# Patient Record
Sex: Female | Born: 1950
Health system: Southern US, Community
[De-identification: ages and names within clinical notes are randomized; demographics above are authoritative.]

## PROBLEM LIST (undated history)

## (undated) DIAGNOSIS — F32A Depression, unspecified: Secondary | ICD-10-CM

## (undated) DIAGNOSIS — I429 Cardiomyopathy, unspecified: Secondary | ICD-10-CM

## (undated) DIAGNOSIS — D72819 Decreased white blood cell count, unspecified: Secondary | ICD-10-CM

## (undated) DIAGNOSIS — G500 Trigeminal neuralgia: Secondary | ICD-10-CM

## (undated) DIAGNOSIS — Z9884 Bariatric surgery status: Secondary | ICD-10-CM

## (undated) DIAGNOSIS — J45909 Unspecified asthma, uncomplicated: Secondary | ICD-10-CM

## (undated) DIAGNOSIS — J849 Interstitial pulmonary disease, unspecified: Secondary | ICD-10-CM

## (undated) DIAGNOSIS — E785 Hyperlipidemia, unspecified: Secondary | ICD-10-CM

## (undated) DIAGNOSIS — R310 Gross hematuria: Secondary | ICD-10-CM

## (undated) DIAGNOSIS — M4800 Spinal stenosis, site unspecified: Secondary | ICD-10-CM

## (undated) DIAGNOSIS — I10 Essential (primary) hypertension: Secondary | ICD-10-CM

## (undated) DIAGNOSIS — I728 Aneurysm of other specified arteries: Secondary | ICD-10-CM

## (undated) DIAGNOSIS — M8580 Other specified disorders of bone density and structure, unspecified site: Secondary | ICD-10-CM

## (undated) DIAGNOSIS — D509 Iron deficiency anemia, unspecified: Secondary | ICD-10-CM

## (undated) DIAGNOSIS — Z9989 Dependence on other enabling machines and devices: Secondary | ICD-10-CM

## (undated) DIAGNOSIS — J3089 Other allergic rhinitis: Secondary | ICD-10-CM

## (undated) DIAGNOSIS — T148XXA Other injury of unspecified body region, initial encounter: Secondary | ICD-10-CM

## (undated) DIAGNOSIS — R109 Unspecified abdominal pain: Secondary | ICD-10-CM

## (undated) DIAGNOSIS — I700 Atherosclerosis of aorta: Secondary | ICD-10-CM

## (undated) DIAGNOSIS — R0683 Snoring: Secondary | ICD-10-CM

## (undated) DIAGNOSIS — E669 Obesity, unspecified: Secondary | ICD-10-CM

## (undated) DIAGNOSIS — R609 Edema, unspecified: Secondary | ICD-10-CM

## (undated) DIAGNOSIS — N183 Chronic kidney disease, stage 3 unspecified: Secondary | ICD-10-CM

## (undated) DIAGNOSIS — J189 Pneumonia, unspecified organism: Secondary | ICD-10-CM

## (undated) DIAGNOSIS — M5412 Radiculopathy, cervical region: Secondary | ICD-10-CM

## (undated) DIAGNOSIS — E539 Vitamin B deficiency, unspecified: Secondary | ICD-10-CM

## (undated) DIAGNOSIS — D869 Sarcoidosis, unspecified: Secondary | ICD-10-CM

## (undated) DIAGNOSIS — N281 Cyst of kidney, acquired: Secondary | ICD-10-CM

## (undated) DIAGNOSIS — F329 Major depressive disorder, single episode, unspecified: Secondary | ICD-10-CM

## (undated) DIAGNOSIS — G2581 Restless legs syndrome: Secondary | ICD-10-CM

## (undated) DIAGNOSIS — M1990 Unspecified osteoarthritis, unspecified site: Secondary | ICD-10-CM

## (undated) DIAGNOSIS — G4733 Obstructive sleep apnea (adult) (pediatric): Secondary | ICD-10-CM

## (undated) DIAGNOSIS — D573 Sickle-cell trait: Secondary | ICD-10-CM

## (undated) DIAGNOSIS — R10A0 Flank pain, unspecified side: Secondary | ICD-10-CM

## (undated) HISTORY — DX: Other injury of unspecified body region, initial encounter: T14.8XXA

## (undated) HISTORY — DX: Vitamin B deficiency, unspecified: E53.9

## (undated) HISTORY — DX: Other specified disorders of bone density and structure, unspecified site: M85.80

## (undated) HISTORY — DX: Depression, unspecified: F32.A

## (undated) HISTORY — DX: Major depressive disorder, single episode, unspecified: F32.9

## (undated) HISTORY — DX: Sickle-cell trait: D57.3

## (undated) HISTORY — DX: Restless legs syndrome: G25.81

## (undated) HISTORY — PX: GASTRIC BYPASS: SHX52

## (undated) HISTORY — DX: Trigeminal neuralgia: G50.0

## (undated) HISTORY — DX: Flank pain, unspecified side: R10.A0

## (undated) HISTORY — DX: Iron deficiency anemia, unspecified: D50.9

## (undated) HISTORY — DX: Snoring: R06.83

## (undated) HISTORY — DX: Decreased white blood cell count, unspecified: D72.819

## (undated) HISTORY — DX: Other allergic rhinitis: J30.89

## (undated) HISTORY — DX: Essential (primary) hypertension: I10

## (undated) HISTORY — DX: Obesity, unspecified: E66.9

## (undated) HISTORY — DX: Edema, unspecified: R60.9

## (undated) HISTORY — DX: Radiculopathy, cervical region: M54.12

## (undated) HISTORY — DX: Cyst of kidney, acquired: N28.1

## (undated) HISTORY — DX: Gross hematuria: R31.0

## (undated) HISTORY — DX: Unspecified abdominal pain: R10.9

## (undated) HISTORY — DX: Unspecified osteoarthritis, unspecified site: M19.90

## (undated) HISTORY — DX: Dependence on other enabling machines and devices: Z99.89

## (undated) HISTORY — PX: SHOULDER ARTHROSCOPY: SHX128

## (undated) HISTORY — DX: Obstructive sleep apnea (adult) (pediatric): G47.33

## (undated) HISTORY — DX: Spinal stenosis, site unspecified: M48.00

## (undated) HISTORY — PX: TUBAL LIGATION: SHX77

## (undated) HISTORY — PX: BARIATRIC SURGERY: SHX1103

---

## 2004-09-25 ENCOUNTER — Other Ambulatory Visit: Admission: RE | Admit: 2004-09-25 | Discharge: 2004-09-25 | Payer: Self-pay | Admitting: Obstetrics and Gynecology

## 2004-09-25 ENCOUNTER — Observation Stay (HOSPITAL_COMMUNITY): Admission: AD | Admit: 2004-09-25 | Discharge: 2004-09-25 | Payer: Self-pay | Admitting: Obstetrics and Gynecology

## 2004-10-11 ENCOUNTER — Ambulatory Visit (HOSPITAL_COMMUNITY): Admission: RE | Admit: 2004-10-11 | Discharge: 2004-10-11 | Payer: Self-pay | Admitting: Obstetrics and Gynecology

## 2005-02-28 ENCOUNTER — Ambulatory Visit: Payer: Self-pay | Admitting: Oncology

## 2005-05-07 ENCOUNTER — Ambulatory Visit: Payer: Self-pay | Admitting: Oncology

## 2005-05-08 ENCOUNTER — Ambulatory Visit (HOSPITAL_COMMUNITY): Admission: RE | Admit: 2005-05-08 | Discharge: 2005-05-08 | Payer: Self-pay | Admitting: Oncology

## 2005-10-24 ENCOUNTER — Ambulatory Visit: Payer: Self-pay | Admitting: Oncology

## 2006-02-03 ENCOUNTER — Ambulatory Visit: Payer: Self-pay | Admitting: Oncology

## 2006-05-05 ENCOUNTER — Ambulatory Visit: Payer: Self-pay | Admitting: Oncology

## 2006-08-04 ENCOUNTER — Ambulatory Visit: Payer: Self-pay | Admitting: Oncology

## 2006-11-03 ENCOUNTER — Ambulatory Visit: Payer: Self-pay | Admitting: Oncology

## 2007-04-01 ENCOUNTER — Emergency Department (HOSPITAL_COMMUNITY): Admission: EM | Admit: 2007-04-01 | Discharge: 2007-04-01 | Payer: Self-pay | Admitting: Family Medicine

## 2007-12-11 ENCOUNTER — Emergency Department (HOSPITAL_COMMUNITY): Admission: EM | Admit: 2007-12-11 | Discharge: 2007-12-11 | Payer: Self-pay | Admitting: Emergency Medicine

## 2008-02-01 ENCOUNTER — Emergency Department (HOSPITAL_COMMUNITY): Admission: EM | Admit: 2008-02-01 | Discharge: 2008-02-01 | Payer: Self-pay | Admitting: Emergency Medicine

## 2008-07-20 ENCOUNTER — Encounter: Admission: RE | Admit: 2008-07-20 | Discharge: 2008-07-20 | Payer: Self-pay | Admitting: Family Medicine

## 2008-07-20 ENCOUNTER — Other Ambulatory Visit: Admission: RE | Admit: 2008-07-20 | Discharge: 2008-07-20 | Payer: Self-pay | Admitting: Family Medicine

## 2009-05-14 ENCOUNTER — Emergency Department (HOSPITAL_COMMUNITY): Admission: EM | Admit: 2009-05-14 | Discharge: 2009-05-14 | Payer: Self-pay | Admitting: Emergency Medicine

## 2009-07-27 ENCOUNTER — Emergency Department (HOSPITAL_COMMUNITY): Admission: EM | Admit: 2009-07-27 | Discharge: 2009-07-27 | Payer: Self-pay | Admitting: Emergency Medicine

## 2009-09-16 ENCOUNTER — Emergency Department (HOSPITAL_COMMUNITY): Admission: EM | Admit: 2009-09-16 | Discharge: 2009-09-16 | Payer: Self-pay | Admitting: Family Medicine

## 2010-02-14 ENCOUNTER — Emergency Department (HOSPITAL_COMMUNITY): Admission: EM | Admit: 2010-02-14 | Discharge: 2010-02-14 | Payer: Self-pay | Admitting: Emergency Medicine

## 2010-06-04 ENCOUNTER — Emergency Department (HOSPITAL_COMMUNITY): Admission: EM | Admit: 2010-06-04 | Discharge: 2010-06-04 | Payer: Self-pay | Admitting: Emergency Medicine

## 2010-09-14 ENCOUNTER — Encounter
Admission: RE | Admit: 2010-09-14 | Discharge: 2010-09-14 | Payer: Self-pay | Source: Home / Self Care | Attending: Family Medicine | Admitting: Family Medicine

## 2010-10-12 ENCOUNTER — Other Ambulatory Visit: Payer: Self-pay | Admitting: Family Medicine

## 2010-10-12 DIAGNOSIS — I10 Essential (primary) hypertension: Secondary | ICD-10-CM

## 2010-10-17 ENCOUNTER — Ambulatory Visit
Admission: RE | Admit: 2010-10-17 | Discharge: 2010-10-17 | Disposition: A | Discharge status: Home / Self Care | Payer: Federal, State, Local not specified - PPO | Source: Ambulatory Visit | Attending: Family Medicine | Admitting: Family Medicine

## 2010-10-17 DIAGNOSIS — I10 Essential (primary) hypertension: Secondary | ICD-10-CM

## 2010-10-17 MED ORDER — GADOBENATE DIMEGLUMINE 529 MG/ML IV SOLN
20.0000 mL | Freq: Once | INTRAVENOUS | Status: AC | PRN
  Administered 2010-10-17: 20 mL via INTRAVENOUS

## 2010-10-17 MED ORDER — GADOBENATE DIMEGLUMINE 529 MG/ML IV SOLN: 20.0000 mL | Freq: Once | INTRAVENOUS | Status: AC | PRN

## 2010-10-31 LAB — POCT URINALYSIS DIPSTICK
Bilirubin Urine: NEGATIVE
Glucose, UA: NEGATIVE mg/dL
Ketones, ur: NEGATIVE mg/dL
Nitrite: NEGATIVE
Protein, ur: NEGATIVE mg/dL
Specific Gravity, Urine: 1.015 (ref 1.005–1.030)
Urobilinogen, UA: 0.2 mg/dL (ref 0.0–1.0)
pH: 6 (ref 5.0–8.0)

## 2010-11-05 LAB — URINALYSIS, ROUTINE W REFLEX MICROSCOPIC
Bilirubin Urine: NEGATIVE
Glucose, UA: NEGATIVE mg/dL
Hgb urine dipstick: NEGATIVE
Nitrite: NEGATIVE
Protein, ur: NEGATIVE mg/dL
Specific Gravity, Urine: 1.011 (ref 1.005–1.030)
Urobilinogen, UA: 1 mg/dL (ref 0.0–1.0)
pH: 6.5 (ref 5.0–8.0)

## 2010-11-05 LAB — CBC
HCT: 32.3 % — ABNORMAL LOW (ref 36.0–46.0)
Hemoglobin: 10.8 g/dL — ABNORMAL LOW (ref 12.0–15.0)
MCH: 28.6 pg (ref 26.0–34.0)
MCHC: 33.4 g/dL (ref 30.0–36.0)
MCV: 85.5 fL (ref 78.0–100.0)
Platelets: 214 10*3/uL (ref 150–400)
RBC: 3.78 MIL/uL — ABNORMAL LOW (ref 3.87–5.11)
RDW: 12.4 % (ref 11.5–15.5)
WBC: 4.9 10*3/uL (ref 4.0–10.5)

## 2010-11-05 LAB — COMPREHENSIVE METABOLIC PANEL
ALT: 11 U/L (ref 0–35)
AST: 25 U/L (ref 0–37)
Albumin: 4 g/dL (ref 3.5–5.2)
Alkaline Phosphatase: 64 U/L (ref 39–117)
BUN: 19 mg/dL (ref 6–23)
CO2: 23 mEq/L (ref 19–32)
Calcium: 9.4 mg/dL (ref 8.4–10.5)
Chloride: 108 mEq/L (ref 96–112)
Creatinine, Ser: 1.85 mg/dL — ABNORMAL HIGH (ref 0.4–1.2)
GFR calc Af Amer: 34 mL/min — ABNORMAL LOW (ref >60)
GFR calc non Af Amer: 28 mL/min — ABNORMAL LOW (ref >60)
Glucose, Bld: 94 mg/dL (ref 70–99)
Potassium: 3.8 mEq/L (ref 3.5–5.1)
Sodium: 141 mEq/L (ref 135–145)
Total Bilirubin: 0.6 mg/dL (ref 0.3–1.2)
Total Protein: 7.5 g/dL (ref 6.0–8.3)

## 2010-11-05 LAB — DIFFERENTIAL
Basophils Absolute: 0 10*3/uL (ref 0.0–0.1)
Basophils Relative: 1 % (ref 0–1)
Eosinophils Absolute: 0.1 10*3/uL (ref 0.0–0.7)
Eosinophils Relative: 1 % (ref 0–5)
Lymphocytes Relative: 16 % (ref 12–46)
Lymphs Abs: 0.8 10*3/uL (ref 0.7–4.0)
Monocytes Absolute: 0.5 10*3/uL (ref 0.1–1.0)
Monocytes Relative: 10 % (ref 3–12)
Neutro Abs: 3.6 10*3/uL (ref 1.7–7.7)
Neutrophils Relative %: 73 % (ref 43–77)

## 2010-11-05 LAB — HEMOCCULT GUIAC POC 1CARD (OFFICE): Fecal Occult Bld: NEGATIVE

## 2010-11-05 LAB — URINE CULTURE
Colony Count: NO GROWTH
Culture: NO GROWTH

## 2010-11-05 LAB — D-DIMER, QUANTITATIVE: D-Dimer, Quant: 0.24 ug/mL-FEU (ref 0.00–0.48)

## 2010-11-05 LAB — GLUCOSE, CAPILLARY: Glucose-Capillary: 100 mg/dL — ABNORMAL HIGH (ref 70–99)

## 2010-11-05 LAB — LIPASE, BLOOD: Lipase: 35 U/L (ref 11–59)

## 2010-11-20 LAB — POCT URINALYSIS DIP (DEVICE)
Bilirubin Urine: NEGATIVE
Glucose, UA: NEGATIVE mg/dL
Ketones, ur: NEGATIVE mg/dL
Nitrite: NEGATIVE
Protein, ur: NEGATIVE mg/dL
Specific Gravity, Urine: 1.015 (ref 1.005–1.030)
Urobilinogen, UA: 0.2 mg/dL (ref 0.0–1.0)
pH: 5.5 (ref 5.0–8.0)

## 2011-01-04 NOTE — Discharge Summary (Signed)
NAME:  Melissa [name], Melissa [name]            ACCOUNT NO.:  [id number]   MEDICAL RECORD NO.:  [id number]          PATIENT TYPE:  OBV   LOCATION:  9311                          FACILITY:  [facility]   PHYSICIAN:  Janine Limbo, M.D.DATE OF BIRTH:  1951-01-26   DATE OF ADMISSION:  09/25/2004  DATE OF DISCHARGE:  09/25/2004                                 DISCHARGE SUMMARY   DISCHARGE DIAGNOSES:  1.  Menometrorrhagia.  2.  Severe anemia.  3.  Dizziness.  4.  Fatigue.   PROCEDURE:  Blood transfusion of 2 units.   HISTORY OF PRESENT ILLNESS:  Melissa [name] is a 60 year old female with a  known history of menometrorrhagia.  The patient complains of increasing  dizziness and fatigue.  The patient presented to the office where she was  found to be significantly orthostatic.  Her hemoglobin was noted to be 6.7.  her white blood cell count was 4700.  Her platelet count was 282,000.   ADMISSION PHYSICAL EXAMINATION:  VITAL SIGNS:  Stable.  HEENT:  Within normal limits.  CHEST:  Clear.  HEART:  Regular rate and rhythm.  ABDOMEN:  Soft.  PELVIC:  Exam was per Dr. Redmond Baseman exam in the office.   HOSPITAL COURSE:  The patient was observed under the observation status.  She was transfused 2 units of packed red blood cells which she tolerated  well.  She was discharged home after her transfusion was completed.  Her  post-transfusion hemoglobin was 7.5.  Her chemistries were within normal  limits, except for a glucose of 134 and an albumin of 3.4.  Her TSH was  2.288.  Her total iron was 10 (normal 42 to 135).  Her B12 was 340 which is  considered normal.  Her folate was 11.9 which is considered normal.  Her  prolactin was 22.6.  Her blood type is noted to be AB positive.  Her  antibody screen is negative.   FOLLOW UP:  The patient will return to the office for a followup examination  with Dr. Normand Sloop in two weeks.  She will call for questions or concerns.      AVS/MEDQ  D:  10/11/2004  T:  10/11/2004   Job:  [id number]

## 2011-03-13 ENCOUNTER — Other Ambulatory Visit: Payer: Self-pay | Admitting: Family Medicine

## 2011-03-13 ENCOUNTER — Ambulatory Visit
Admission: RE | Admit: 2011-03-13 | Discharge: 2011-03-13 | Disposition: A | Discharge status: Home / Self Care | Payer: Federal, State, Local not specified - PPO | Source: Ambulatory Visit | Attending: Family Medicine | Admitting: Family Medicine

## 2011-03-13 DIAGNOSIS — J189 Pneumonia, unspecified organism: Secondary | ICD-10-CM

## 2011-04-15 ENCOUNTER — Other Ambulatory Visit: Payer: Self-pay | Admitting: Family Medicine

## 2011-04-15 ENCOUNTER — Ambulatory Visit
Admission: RE | Admit: 2011-04-15 | Discharge: 2011-04-15 | Disposition: A | Discharge status: Home / Self Care | Payer: Federal, State, Local not specified - PPO | Source: Ambulatory Visit | Attending: Family Medicine | Admitting: Family Medicine

## 2011-04-15 DIAGNOSIS — R059 Cough, unspecified: Secondary | ICD-10-CM

## 2011-04-15 DIAGNOSIS — R05 Cough: Secondary | ICD-10-CM

## 2011-06-03 LAB — POCT URINALYSIS DIP (DEVICE)
Bilirubin Urine: NEGATIVE
Glucose, UA: NEGATIVE
Ketones, ur: NEGATIVE
Nitrite: NEGATIVE
Operator id: 239701
Protein, ur: NEGATIVE
Specific Gravity, Urine: 1.02
Urobilinogen, UA: 0.2
pH: 5.5

## 2011-11-07 ENCOUNTER — Ambulatory Visit: Payer: Self-pay | Admitting: Family Medicine

## 2012-02-12 ENCOUNTER — Emergency Department: Payer: Self-pay | Admitting: *Deleted

## 2012-02-12 LAB — COMPREHENSIVE METABOLIC PANEL
Albumin: 3.7 g/dL (ref 3.4–5.0)
Alkaline Phosphatase: 88 U/L (ref 50–136)
Anion Gap: 9 (ref 7–16)
BUN: 19 mg/dL — ABNORMAL HIGH (ref 7–18)
Bilirubin,Total: 0.4 mg/dL (ref 0.2–1.0)
Calcium, Total: 9 mg/dL (ref 8.5–10.1)
Chloride: 104 mmol/L (ref 98–107)
Co2: 26 mmol/L (ref 21–32)
Creatinine: 1.57 mg/dL — ABNORMAL HIGH (ref 0.60–1.30)
EGFR (African American): 41 — ABNORMAL LOW
EGFR (Non-African Amer.): 36 — ABNORMAL LOW
Glucose: 106 mg/dL — ABNORMAL HIGH (ref 65–99)
Osmolality: 280 (ref 275–301)
Potassium: 4 mmol/L (ref 3.5–5.1)
SGOT(AST): 30 U/L (ref 15–37)
SGPT (ALT): 14 U/L
Sodium: 139 mmol/L (ref 136–145)
Total Protein: 7.5 g/dL (ref 6.4–8.2)

## 2012-02-12 LAB — CBC
HCT: 34.2 % — ABNORMAL LOW (ref 35.0–47.0)
HGB: 11.4 g/dL — ABNORMAL LOW (ref 12.0–16.0)
MCH: 29.1 pg (ref 26.0–34.0)
MCHC: 33.3 g/dL (ref 32.0–36.0)
MCV: 87 fL (ref 80–100)
Platelet: 197 10*3/uL (ref 150–440)
RBC: 3.91 10*6/uL (ref 3.80–5.20)
RDW: 12.7 % (ref 11.5–14.5)
WBC: 5.3 10*3/uL (ref 3.6–11.0)

## 2012-02-12 LAB — URINALYSIS, COMPLETE
Bilirubin,UR: NEGATIVE
Glucose,UR: NEGATIVE mg/dL (ref 0–75)
Ketone: NEGATIVE
Nitrite: NEGATIVE
Ph: 6 (ref 4.5–8.0)
Protein: NEGATIVE
RBC,UR: 4 /HPF (ref 0–5)
Specific Gravity: 1.006 (ref 1.003–1.030)
Squamous Epithelial: 1
WBC UR: 9 /HPF (ref 0–5)

## 2012-02-12 LAB — CK TOTAL AND CKMB (NOT AT ARMC)
CK, Total: 257 U/L — ABNORMAL HIGH (ref 21–215)
CK-MB: 2.5 ng/mL (ref 0.5–3.6)

## 2012-11-20 ENCOUNTER — Ambulatory Visit: Payer: Self-pay | Admitting: Family Medicine

## 2012-12-11 ENCOUNTER — Ambulatory Visit: Payer: Self-pay | Admitting: Family Medicine

## 2013-03-08 ENCOUNTER — Ambulatory Visit: Payer: Self-pay | Admitting: Family Medicine

## 2013-05-07 ENCOUNTER — Ambulatory Visit: Payer: Self-pay | Admitting: Family Medicine

## 2013-10-12 ENCOUNTER — Ambulatory Visit: Payer: Self-pay | Admitting: Family Medicine

## 2013-10-21 ENCOUNTER — Ambulatory Visit: Payer: Self-pay | Admitting: Family Medicine

## 2014-01-04 DIAGNOSIS — M4802 Spinal stenosis, cervical region: Secondary | ICD-10-CM | POA: Insufficient documentation

## 2014-01-04 DIAGNOSIS — M47812 Spondylosis without myelopathy or radiculopathy, cervical region: Secondary | ICD-10-CM | POA: Insufficient documentation

## 2014-01-04 DIAGNOSIS — M5412 Radiculopathy, cervical region: Secondary | ICD-10-CM | POA: Insufficient documentation

## 2014-02-17 ENCOUNTER — Ambulatory Visit: Payer: Self-pay | Admitting: Family Medicine

## 2014-03-03 ENCOUNTER — Ambulatory Visit: Payer: Self-pay | Admitting: Gastroenterology

## 2014-03-22 ENCOUNTER — Ambulatory Visit: Payer: Self-pay | Admitting: Urology

## 2014-03-29 ENCOUNTER — Ambulatory Visit: Payer: Self-pay | Admitting: Urology

## 2014-04-15 ENCOUNTER — Emergency Department: Payer: Self-pay | Admitting: Emergency Medicine

## 2014-04-15 LAB — URINALYSIS, COMPLETE
Bilirubin,UR: NEGATIVE
Glucose,UR: NEGATIVE mg/dL (ref 0–75)
Ketone: NEGATIVE
Nitrite: NEGATIVE
Ph: 5 (ref 4.5–8.0)
Protein: NEGATIVE
RBC,UR: 2 /HPF (ref 0–5)
Specific Gravity: 1.014 (ref 1.003–1.030)
Squamous Epithelial: 5
WBC UR: 13 /HPF (ref 0–5)

## 2014-04-15 LAB — CBC
HCT: 36.5 % (ref 35.0–47.0)
HGB: 11.6 g/dL — ABNORMAL LOW (ref 12.0–16.0)
MCH: 27.1 pg (ref 26.0–34.0)
MCHC: 31.8 g/dL — ABNORMAL LOW (ref 32.0–36.0)
MCV: 85 fL (ref 80–100)
Platelet: 269 10*3/uL (ref 150–440)
RBC: 4.28 10*6/uL (ref 3.80–5.20)
RDW: 13.1 % (ref 11.5–14.5)
WBC: 6.3 10*3/uL (ref 3.6–11.0)

## 2014-04-15 LAB — COMPREHENSIVE METABOLIC PANEL
Albumin: 3.6 g/dL (ref 3.4–5.0)
Alkaline Phosphatase: 89 U/L
Anion Gap: 7 (ref 7–16)
BUN: 20 mg/dL — ABNORMAL HIGH (ref 7–18)
Bilirubin,Total: 0.3 mg/dL (ref 0.2–1.0)
Calcium, Total: 8.6 mg/dL (ref 8.5–10.1)
Chloride: 107 mmol/L (ref 98–107)
Co2: 25 mmol/L (ref 21–32)
Creatinine: 1.74 mg/dL — ABNORMAL HIGH (ref 0.60–1.30)
EGFR (African American): 36 — ABNORMAL LOW
EGFR (Non-African Amer.): 31 — ABNORMAL LOW
Glucose: 94 mg/dL (ref 65–99)
Osmolality: 280 (ref 275–301)
Potassium: 4.4 mmol/L (ref 3.5–5.1)
SGOT(AST): 24 U/L (ref 15–37)
SGPT (ALT): 12 U/L — ABNORMAL LOW
Sodium: 139 mmol/L (ref 136–145)
Total Protein: 7.6 g/dL (ref 6.4–8.2)

## 2014-04-16 LAB — CK TOTAL AND CKMB (NOT AT ARMC)
CK, Total: 129 U/L
CK-MB: 1.3 ng/mL (ref 0.5–3.6)

## 2014-04-16 LAB — MAGNESIUM: Magnesium: 2 mg/dL

## 2014-10-21 ENCOUNTER — Emergency Department: Payer: Self-pay | Admitting: Emergency Medicine

## 2015-01-18 ENCOUNTER — Telehealth: Payer: Self-pay | Admitting: Family Medicine

## 2015-01-18 NOTE — Telephone Encounter (Signed)
I did not make a referral on her last visit, but she has seen Dr. Arnoldo Morale in the past For her sinus, she can try fluids, rest, neti pot, otc nasal sprays and otc cold medication if not better return for follow up

## 2015-03-16 ENCOUNTER — Other Ambulatory Visit: Payer: Self-pay

## 2015-03-16 DIAGNOSIS — R310 Gross hematuria: Secondary | ICD-10-CM

## 2015-04-14 ENCOUNTER — Other Ambulatory Visit: Payer: Self-pay | Admitting: Family Medicine

## 2015-04-17 ENCOUNTER — Other Ambulatory Visit: Payer: Self-pay | Admitting: *Deleted

## 2015-04-17 ENCOUNTER — Encounter: Payer: Self-pay | Admitting: *Deleted

## 2015-04-25 ENCOUNTER — Ambulatory Visit: Payer: Self-pay | Admitting: Urology

## 2015-05-01 ENCOUNTER — Encounter: Payer: Self-pay | Admitting: Urology

## 2015-05-01 ENCOUNTER — Ambulatory Visit: Payer: Self-pay | Admitting: Urology

## 2015-06-13 ENCOUNTER — Encounter: Payer: Self-pay | Admitting: Family Medicine

## 2015-06-13 ENCOUNTER — Ambulatory Visit (INDEPENDENT_AMBULATORY_CARE_PROVIDER_SITE_OTHER): Payer: Federal, State, Local not specified - PPO | Admitting: Family Medicine

## 2015-06-13 VITALS — BP 136/86 | HR 110 | Temp 97.8°F | Resp 16 | Ht 71.0 in | Wt 242.8 lb

## 2015-06-13 DIAGNOSIS — F325 Major depressive disorder, single episode, in full remission: Secondary | ICD-10-CM | POA: Insufficient documentation

## 2015-06-13 DIAGNOSIS — Z23 Encounter for immunization: Secondary | ICD-10-CM

## 2015-06-13 DIAGNOSIS — Z862 Personal history of diseases of the blood and blood-forming organs and certain disorders involving the immune mechanism: Secondary | ICD-10-CM

## 2015-06-13 DIAGNOSIS — M7070 Other bursitis of hip, unspecified hip: Secondary | ICD-10-CM | POA: Diagnosis not present

## 2015-06-13 DIAGNOSIS — E559 Vitamin D deficiency, unspecified: Secondary | ICD-10-CM | POA: Diagnosis not present

## 2015-06-13 DIAGNOSIS — I10 Essential (primary) hypertension: Secondary | ICD-10-CM

## 2015-06-13 DIAGNOSIS — J302 Other seasonal allergic rhinitis: Secondary | ICD-10-CM

## 2015-06-13 DIAGNOSIS — D72819 Decreased white blood cell count, unspecified: Secondary | ICD-10-CM

## 2015-06-13 DIAGNOSIS — Z9884 Bariatric surgery status: Secondary | ICD-10-CM

## 2015-06-13 DIAGNOSIS — G2581 Restless legs syndrome: Secondary | ICD-10-CM

## 2015-06-13 DIAGNOSIS — E538 Deficiency of other specified B group vitamins: Secondary | ICD-10-CM | POA: Insufficient documentation

## 2015-06-13 DIAGNOSIS — F331 Major depressive disorder, recurrent, moderate: Secondary | ICD-10-CM | POA: Diagnosis not present

## 2015-06-13 DIAGNOSIS — E669 Obesity, unspecified: Secondary | ICD-10-CM

## 2015-06-13 DIAGNOSIS — Z13220 Encounter for screening for lipoid disorders: Secondary | ICD-10-CM

## 2015-06-13 DIAGNOSIS — M170 Bilateral primary osteoarthritis of knee: Secondary | ICD-10-CM | POA: Insufficient documentation

## 2015-06-13 DIAGNOSIS — M1710 Unilateral primary osteoarthritis, unspecified knee: Secondary | ICD-10-CM

## 2015-06-13 DIAGNOSIS — J3089 Other allergic rhinitis: Secondary | ICD-10-CM | POA: Insufficient documentation

## 2015-06-13 DIAGNOSIS — R0789 Other chest pain: Secondary | ICD-10-CM

## 2015-06-13 MED ORDER — LOSARTAN POTASSIUM 25 MG PO TABS: 25.0000 mg | ORAL_TABLET | Freq: Every day | ORAL | Status: DC

## 2015-06-13 MED ORDER — CYANOCOBALAMIN 1000 MCG/ML IJ SOLN
1000.0000 ug | Freq: Once | INTRAMUSCULAR | Status: AC
  Administered 2015-06-13: 1000 ug via INTRAMUSCULAR

## 2015-06-13 MED ORDER — DULOXETINE HCL 30 MG PO CPEP: 30.0000 mg | ORAL_CAPSULE | Freq: Every day | ORAL | Status: DC

## 2015-06-13 NOTE — Progress Notes (Signed)
Name: Melissa [name]   MRN: [id number]    DOB: September 28, 1950   Date:06/13/2015       Progress Note  Subjective  Chief Complaint  Chief Complaint  Patient presents with  . Medication Refill    follow-up  . Hypertension    some chest tightness  . Depression    on and off  . Hip Pain    onset several months worsening  . Pain    back, knees, hip and shoulder ongoing    HPI  HTN: patient is taking bp medication at low dose, and bp has been at goal, she has noticed some chest tightness that happens twice weekly, not related with activity, or meals. Lasts between 10-30 minutes, and resolves by itself. She denies diaphoresis, or associated SOB, pain does not radiate. Pain is 3-4/10  Neck pain/back pain/hip pain/shoulder pain: seen by Dr. Phyllis Ginger recently and had steroid injection on right shoulder, also going to see Dr. Arnoldo Morale next week and will discuss pain on right leg, and low back pain. Concerned about all the steroid injection, but is always in pain, in different sites.   Major Depression: she has been taking Cymbalta 60 mg but still has severe anhedonia, crying spells, no energy, feels sad.  She states concerned about mother that is in a nursing home but also is pain all the time. Only gets out to go to work and to [employer].   B12 deficiency: got B12 injection today, feeling very tired.   Obesity: she had gastric bypass in 2000, she went down to 172 lbs after surgery, but has gradually gained it back. She states food is her comfort.    Patient Active Problem List   Diagnosis Date Noted  . Primary osteoarthritis of knee 06/13/2015  . Hip bursitis 06/13/2015  . RLS (restless legs syndrome) 06/13/2015  . Depression, major, recurrent, moderate ([place]) 06/13/2015  . Leukopenia 06/13/2015  . B12 deficiency 06/13/2015  . History of Roux-en-Y gastric bypass 06/13/2015  . History of iron deficiency anemia 06/13/2015  . Vitamin D deficiency 06/13/2015  . Hypertension, benign 06/13/2015   . Allergic rhinitis, seasonal 06/13/2015  . Cervical radiculitis 01/04/2014  . Cervical spinal stenosis 01/04/2014  . Cervical osteoarthritis 01/04/2014    Past Surgical History  Procedure Laterality Date  . Bariatric surgery    . Gastric bypass    . Cesarean section      3 or more  . Tubal ligation      Family History  Problem Relation Age of Onset  . Hypercholesterolemia Mother   . Heart disease Mother   . Hypertension Mother   . Lung cancer Brother   . Alcohol abuse Father   . Alcohol abuse Brother   . Diabetes Mellitus II Sister     Social History   Social History  . Marital Status: Single    Spouse Name: N/A  . Number of Children: N/A  . Years of Education: N/A   Occupational History  . Not on file.   Social History Main Topics  . Smoking status: Never Smoker   . Smokeless tobacco: Never Used  . Alcohol Use: No  . Drug Use: No  . Sexual Activity: Not Currently   Other Topics Concern  . Not on file   Social History Narrative     Current outpatient prescriptions:  .  DULoxetine (CYMBALTA) 30 MG capsule, Take 1 capsule (30 mg total) by mouth daily., Disp: 90 capsule, Rfl: 0 .  ferrous sulfate 325 (65  FE) MG EC tablet, Take 325 mg by mouth 3 (three) times daily with meals., Disp: , Rfl:  .  fluticasone (FLONASE) 50 MCG/ACT nasal spray, Place 2 sprays into both nostrils daily., Disp: , Rfl: 2 .  losartan (COZAAR) 25 MG tablet, Take 1 tablet (25 mg total) by mouth daily., Disp: 90 tablet, Rfl: 1 .  magnesium oxide (MAG-OX) 400 MG tablet, Take 400 mg by mouth daily., Disp: , Rfl:   Allergies  Allergen Reactions  . Contrast Media [Iodinated Diagnostic Agents]   . Shellfish Allergy     Edema     ROS  Constitutional: Negative for fever, mild weight change.  Respiratory: Negative for cough and shortness of breath.   Cardiovascular: Negative for chest pain or palpitations.  Gastrointestinal: Negative for abdominal pain, no bowel changes.   Musculoskeletal: Positive  for gait problem no  joint swelling.  Skin: Negative for rash.  Neurological: Negative for dizziness or headache.  No other specific complaints in a complete review of systems (except as listed in HPI above).   Objective  Filed Vitals:   06/13/15 1603  BP: 136/86  Pulse: 110  Temp: 97.8 F (36.6 C)  TempSrc: Oral  Resp: 16  Height: 5\' 11"  (1.803 m)  Weight: 242 lb 12.8 oz (110.133 kg)  SpO2: 97%    Body mass index is 33.88 kg/(m^2).  Physical Exam  Constitutional: Patient appears well-developed and well-nourished. Obese  No distress.  HEENT: head atraumatic, normocephalic, pupils equal and reactive to light,, neck supple, throat within normal limits Cardiovascular: Normal rate, regular rhythm and normal heart sounds.  No murmur heard. No BLE edema. Pulmonary/Chest: Effort normal and breath sounds normal. No respiratory distress. Abdominal: Soft.  There is no tenderness. Psychiatric: Patient has a normal mood and affect. behavior is normal. Judgment and thought content normal. Muscular Skeletal: no pain during palpation of lumbar spine, negative straight raise, grinding with extension of both knees, pain during palpation of right trochanteric bursa and anterior right knee   PHQ2/9: Depression screen PHQ 2/9 06/13/2015  Decreased Interest 0  Down, Depressed, Hopeless 0  PHQ - 2 Score 0     Fall Risk: Fall Risk  06/13/2015  Falls in the past year? Yes  Number falls in past yr: 1  Injury with Fall? No     Functional Status Survey: Is the patient deaf or have difficulty hearing?: No Does the patient have difficulty seeing, even when wearing glasses/contacts?: Yes (glasses) Does the patient have difficulty concentrating, remembering, or making decisions?: No Does the patient have difficulty walking or climbing stairs?: No Does the patient have difficulty dressing or bathing?: No Does the patient have difficulty doing errands alone such as  visiting a doctor's office or shopping?: No    Assessment & Plan  1. Depression, major, recurrent, moderate ([place])  We will increase dose and if no improvement consider Trintelix, also discussed therapist and she will go to the same one her son goes to - DULoxetine (CYMBALTA) 30 MG capsule; Take 1 capsule (30 mg total) by mouth daily.  Dispense: 90 capsule; Refill: 0  2. Primary osteoarthritis of knee, unspecified laterality  Continue follow up with Ortho  3. Needs flu shot  - Flu Vaccine QUAD 36+ mos PF IM (Fluarix & Fluzone Quad PF)  4. Vitamin B12 deficiency  - cyanocobalamin ((VITAMIN B-12)) injection 1,000 mcg; Inject 1 mL (1,000 mcg total) into the muscle once. - Vitamin B12  5. Hip bursitis, unspecified laterality  - CBC with  Differential/Platelet  6. RLS (restless legs syndrome)  Doing well   7. Leukopenia  - CBC with Differential/Platelet  8. History of Roux-en-Y gastric bypass  - Hemoglobin A1c  9. History of iron deficiency anemia  Recheck labs  10. Vitamin D deficiency  - Vit D  25 hydroxy (rtn osteoporosis monitoring)  11. Hypertension, benign  - losartan (COZAAR) 25 MG tablet; Take 1 tablet (25 mg total) by mouth daily.  Dispense: 90 tablet; Refill: 1 - Hemoglobin A1c - Comprehensive metabolic panel  12. Allergic rhinitis, seasonal  Doing well on medication   13. Chest tightness  - EKG 12-Lead - Ambulatory referral to Cardiology  14. Lipid screening  - Lipid panel  15. Obesity (BMI 30.0-34.9)  Discussed with the patient the risk posed by an increased BMI. Discussed importance of portion control, calorie counting and at least 150 minutes of physical activity weekly. Avoid sweet beverages and drink more water. Eat at least 6 servings of fruit and vegetables daily

## 2015-06-22 ENCOUNTER — Other Ambulatory Visit: Payer: Self-pay | Admitting: Neurosurgery

## 2015-06-22 DIAGNOSIS — M4316 Spondylolisthesis, lumbar region: Secondary | ICD-10-CM

## 2015-06-29 LAB — LIPID PANEL
Chol/HDL Ratio: 2.8 ratio units (ref 0.0–4.4)
Cholesterol, Total: 256 mg/dL — ABNORMAL HIGH (ref 100–199)
HDL: 92 mg/dL (ref >39)
LDL Calculated: 142 mg/dL — ABNORMAL HIGH (ref 0–99)
Triglycerides: 108 mg/dL (ref 0–149)
VLDL Cholesterol Cal: 22 mg/dL (ref 5–40)

## 2015-06-29 LAB — CBC WITH DIFFERENTIAL/PLATELET
Basophils Absolute: 0 10*3/uL (ref 0.0–0.2)
Basos: 0 %
EOS (ABSOLUTE): 0.1 10*3/uL (ref 0.0–0.4)
Eos: 2 %
Hematocrit: 35.2 % (ref 34.0–46.6)
Hemoglobin: 10.9 g/dL — ABNORMAL LOW (ref 11.1–15.9)
Immature Grans (Abs): 0 10*3/uL (ref 0.0–0.1)
Immature Granulocytes: 0 %
Lymphocytes Absolute: 1.1 10*3/uL (ref 0.7–3.1)
Lymphs: 19 %
MCH: 24.6 pg — ABNORMAL LOW (ref 26.6–33.0)
MCHC: 31 g/dL — ABNORMAL LOW (ref 31.5–35.7)
MCV: 80 fL (ref 79–97)
Monocytes Absolute: 0.1 10*3/uL (ref 0.1–0.9)
Monocytes: 2 %
Neutrophils Absolute: 4.6 10*3/uL (ref 1.4–7.0)
Neutrophils: 77 %
Platelets: 317 10*3/uL (ref 150–379)
RBC: 4.43 x10E6/uL (ref 3.77–5.28)
RDW: 15 % (ref 12.3–15.4)
WBC: 6 10*3/uL (ref 3.4–10.8)

## 2015-06-29 LAB — COMPREHENSIVE METABOLIC PANEL
ALT: 9 IU/L (ref 0–32)
AST: 16 IU/L (ref 0–40)
Albumin/Globulin Ratio: 1.4 (ref 1.1–2.5)
Albumin: 4.1 g/dL (ref 3.6–4.8)
Alkaline Phosphatase: 93 IU/L (ref 39–117)
BUN/Creatinine Ratio: 12 (ref 11–26)
BUN: 14 mg/dL (ref 8–27)
Bilirubin Total: 0.3 mg/dL (ref 0.0–1.2)
CO2: 23 mmol/L (ref 18–29)
Calcium: 9.4 mg/dL (ref 8.7–10.3)
Chloride: 102 mmol/L (ref 97–106)
Creatinine, Ser: 1.19 mg/dL — ABNORMAL HIGH (ref 0.57–1.00)
GFR calc Af Amer: 57 mL/min/{1.73_m2} — ABNORMAL LOW (ref >59)
GFR calc non Af Amer: 49 mL/min/{1.73_m2} — ABNORMAL LOW (ref >59)
Globulin, Total: 3 g/dL (ref 1.5–4.5)
Glucose: 113 mg/dL — ABNORMAL HIGH (ref 65–99)
Potassium: 4.3 mmol/L (ref 3.5–5.2)
Sodium: 141 mmol/L (ref 136–144)
Total Protein: 7.1 g/dL (ref 6.0–8.5)

## 2015-06-29 LAB — HEMOGLOBIN A1C
Est. average glucose Bld gHb Est-mCnc: 128 mg/dL
Hgb A1c MFr Bld: 6.1 % — ABNORMAL HIGH (ref 4.8–5.6)

## 2015-06-29 LAB — VITAMIN B12: Vitamin B-12: 787 pg/mL (ref 211–946)

## 2015-06-29 LAB — VITAMIN D 25 HYDROXY (VIT D DEFICIENCY, FRACTURES): Vit D, 25-Hydroxy: 14.1 ng/mL — ABNORMAL LOW (ref 30.0–100.0)

## 2015-07-02 ENCOUNTER — Other Ambulatory Visit: Payer: Self-pay | Admitting: Family Medicine

## 2015-07-02 ENCOUNTER — Encounter: Payer: Self-pay | Admitting: Family Medicine

## 2015-07-02 DIAGNOSIS — N183 Chronic kidney disease, stage 3 (moderate): Secondary | ICD-10-CM

## 2015-07-02 DIAGNOSIS — D649 Anemia, unspecified: Secondary | ICD-10-CM

## 2015-07-06 ENCOUNTER — Ambulatory Visit: Admission: RE | Admit: 2015-07-06 | Payer: Federal, State, Local not specified - PPO | Source: Ambulatory Visit

## 2015-08-11 ENCOUNTER — Encounter: Payer: Self-pay | Admitting: Family Medicine

## 2015-08-11 ENCOUNTER — Ambulatory Visit (INDEPENDENT_AMBULATORY_CARE_PROVIDER_SITE_OTHER): Payer: Federal, State, Local not specified - PPO | Admitting: Family Medicine

## 2015-08-11 VITALS — BP 132/86 | HR 108 | Temp 98.3°F | Resp 18 | Ht 71.0 in | Wt 240.5 lb

## 2015-08-11 DIAGNOSIS — K219 Gastro-esophageal reflux disease without esophagitis: Secondary | ICD-10-CM | POA: Diagnosis not present

## 2015-08-11 DIAGNOSIS — E559 Vitamin D deficiency, unspecified: Secondary | ICD-10-CM | POA: Diagnosis not present

## 2015-08-11 DIAGNOSIS — F331 Major depressive disorder, recurrent, moderate: Secondary | ICD-10-CM

## 2015-08-11 DIAGNOSIS — M1711 Unilateral primary osteoarthritis, right knee: Secondary | ICD-10-CM

## 2015-08-11 DIAGNOSIS — E538 Deficiency of other specified B group vitamins: Secondary | ICD-10-CM | POA: Diagnosis not present

## 2015-08-11 DIAGNOSIS — Z862 Personal history of diseases of the blood and blood-forming organs and certain disorders involving the immune mechanism: Secondary | ICD-10-CM

## 2015-08-11 DIAGNOSIS — N183 Chronic kidney disease, stage 3 unspecified: Secondary | ICD-10-CM

## 2015-08-11 DIAGNOSIS — I10 Essential (primary) hypertension: Secondary | ICD-10-CM

## 2015-08-11 MED ORDER — CYANOCOBALAMIN 1000 MCG/ML IJ SOLN
1000.0000 ug | Freq: Once | INTRAMUSCULAR | Status: AC
  Administered 2015-08-11: 1000 ug via INTRAMUSCULAR

## 2015-08-11 MED ORDER — RANITIDINE HCL 150 MG PO TABS: 150.0000 mg | ORAL_TABLET | Freq: Two times a day (BID) | ORAL | Status: DC

## 2015-08-11 MED ORDER — DULOXETINE HCL 60 MG PO CPEP: 60.0000 mg | ORAL_CAPSULE | Freq: Every day | ORAL | Status: DC

## 2015-08-11 NOTE — Progress Notes (Signed)
Name: ROREY KNILL   MRN: [id number]    DOB: [birth year]   Date:08/11/2015       Progress Note  Subjective  Chief Complaint  Chief Complaint  Patient presents with  . Medication Management  . Anemia    Taking 1 Iron supplement daily  . Vitamin D Deficiency    Needs High dose called in, never receieved prescription.    . Hyperlipidemia    When having labs drawn was told needed to talk about starting a statin therapy.  . Hip Pain    Right hip pain, intermittent, onset-6 weeks   . Gastroesophageal Reflux    Onset-2 months, feels like her chest is burning can happen went she has not even ate anything and has a appointment with cardiology but feels like it is heartburn.  . Depression    Would like to discuss therapy on medication    HPI   HTN: patient is taking bp medication at low dose, and bp has been at goal, she has noticed some chest tightness that happens twice weekly with episodes of regurgitation ( she has an appointment with cardiologist in January )  Lasts between 10-30 minutes, and resolves by itselfes not radiate.  Major Depression: she has been taking Cymbalta 60 mg, she is feeling better since mother died. She grieved before she diet. More motivation now, no crying spells,  feeling much better, she is cleaning her house again  B12 deficiency: she is due for B12 injection   Obesity: she had gastric bypass in 2000, she went down to 172 lbs after surgery, but has gradually gained it back. She lost 3 lbs since last visit.   GERD: still has episodes of regurgitation, she is not currently taking anything for it, but willing to try. Took some baking soda recently with improvemet of heartburn.   CKI: seen by Dr. Juleen China in the past, but lost to follow up, GFR is in the 30's.  She used to take a lot of nsaid's but not currently.   OA knee and hip bursitis: having a lot pain on right knee and hip, but wants to follow up with Ortho to have injections.    Patient Active  Problem List   Diagnosis Date Noted  . Chronic kidney disease (CKD), stage III (moderate) 07/02/2015  . Primary osteoarthritis of knee 06/13/2015  . Hip bursitis 06/13/2015  . RLS (restless legs syndrome) 06/13/2015  . Depression, major, recurrent, moderate ([place]) 06/13/2015  . B12 deficiency 06/13/2015  . History of Roux-en-Y gastric bypass 06/13/2015  . History of iron deficiency anemia 06/13/2015  . Vitamin D deficiency 06/13/2015  . Hypertension, benign 06/13/2015  . Allergic rhinitis, seasonal 06/13/2015  . Obesity (BMI 30.0-34.9) 06/13/2015  . Cervical radiculitis 01/04/2014  . Cervical spinal stenosis 01/04/2014  . Cervical osteoarthritis 01/04/2014    Past Surgical History  Procedure Laterality Date  . Bariatric surgery    . Gastric bypass    . Cesarean section      3 or more  . Tubal ligation      Family History  Problem Relation Age of Onset  . Hypercholesterolemia Mother   . Heart disease Mother   . Hypertension Mother   . Lung cancer Brother   . Alcohol abuse Father   . Alcohol abuse Brother   . Diabetes Mellitus II Sister     Social History   Social History  . Marital Status: Divorced    Spouse Name: N/A  . Number of Children: N/A  .  Years of Education: N/A   Occupational History  . Not on file.   Social History Main Topics  . Smoking status: Never Smoker   . Smokeless tobacco: Never Used  . Alcohol Use: No  . Drug Use: No  . Sexual Activity: Not Currently   Other Topics Concern  . Not on file   Social History Narrative     Current outpatient prescriptions:  .  DULoxetine (CYMBALTA) 60 MG capsule, Take 1 capsule (60 mg total) by mouth daily., Disp: 90 capsule, Rfl: 0 .  ferrous sulfate 325 (65 FE) MG EC tablet, Take 325 mg by mouth 3 (three) times daily with meals., Disp: , Rfl:  .  fluticasone (FLONASE) 50 MCG/ACT nasal spray, Place 2 sprays into both nostrils daily., Disp: , Rfl: 2 .  losartan (COZAAR) 25 MG tablet, Take 1 tablet (25  mg total) by mouth daily., Disp: 90 tablet, Rfl: 1 .  magnesium oxide (MAG-OX) 400 MG tablet, Take 400 mg by mouth daily., Disp: , Rfl:   Allergies  Allergen Reactions  . Contrast Media [Iodinated Diagnostic Agents]   . Shellfish Allergy     Edema     ROS  Ten systems reviewed and is negative except as mentioned in HPI   Objective  Filed Vitals:   08/11/15 1600  BP: 132/86  Pulse: 108  Temp: 98.3 F (36.8 C)  TempSrc: Oral  Resp: 18  Height: 5\' 11"  (1.803 m)  Weight: 240 lb 8 oz (109.09 kg)  SpO2: 96%    Body mass index is 33.56 kg/(m^2).  Physical Exam  Constitutional: Patient appears well-developed and well-nourished. Obese  No distress.  HEENT: head atraumatic, normocephalic, pupils equal and reactive to light,  neck supple, throat within normal limits Cardiovascular: Normal rate, regular rhythm and normal heart sounds.  No murmur heard. No BLE edema. Pulmonary/Chest: Effort normal and breath sounds normal. No respiratory distress. Abdominal: Soft.  There is no tenderness. Psychiatric: Patient has a normal mood and affect. behavior is normal. Judgment and thought content normal. Muscular Skeletal: crepitus with extension of left knee and during palpation of right trochanteric bursa  Recent Results (from the past 2160 hour(s))  Lipid panel     Status: Abnormal   Collection Time: 06/28/15 11:03 AM  Result Value Ref Range   Cholesterol, Total 256 (H) 100 - 199 mg/dL   Triglycerides 108 0 - 149 mg/dL   HDL 92 >39 mg/dL    Comment: According to ATP-III Guidelines, HDL-C >59 mg/dL is considered a negative risk factor for CHD.    VLDL Cholesterol Cal 22 5 - 40 mg/dL   LDL Calculated 142 (H) 0 - 99 mg/dL   Chol/HDL Ratio 2.8 0.0 - 4.4 ratio units    Comment:                                   T. Chol/HDL Ratio                                             Men  Women                               1/2 Avg.Risk  3.4    3.3  Avg.Risk  5.0     4.4                                2X Avg.Risk  9.6    7.1                                3X Avg.Risk 23.4   11.0   Hemoglobin A1c     Status: Abnormal   Collection Time: 06/28/15 11:03 AM  Result Value Ref Range   Hgb A1c MFr Bld 6.1 (H) 4.8 - 5.6 %    Comment:          Pre-diabetes: 5.7 - 6.4          Diabetes: >6.4          Glycemic control for adults with diabetes: <7.0    Est. average glucose Bld gHb Est-mCnc 128 mg/dL  Comprehensive metabolic panel     Status: Abnormal   Collection Time: 06/28/15 11:03 AM  Result Value Ref Range   Glucose 113 (H) 65 - 99 mg/dL   BUN 14 8 - 27 mg/dL   Creatinine, Ser 1.19 (H) 0.57 - 1.00 mg/dL   GFR calc non Af Amer 49 (L) >59 mL/min/1.73   GFR calc Af Amer 57 (L) >59 mL/min/1.73   BUN/Creatinine Ratio 12 11 - 26   Sodium 141 136 - 144 mmol/L   Potassium 4.3 3.5 - 5.2 mmol/L   Chloride 102 97 - 106 mmol/L   CO2 23 18 - 29 mmol/L   Calcium 9.4 8.7 - 10.3 mg/dL   Total Protein 7.1 6.0 - 8.5 g/dL   Albumin 4.1 3.6 - 4.8 g/dL   Globulin, Total 3.0 1.5 - 4.5 g/dL   Albumin/Globulin Ratio 1.4 1.1 - 2.5   Bilirubin Total 0.3 0.0 - 1.2 mg/dL   Alkaline Phosphatase 93 39 - 117 IU/L   AST 16 0 - 40 IU/L   ALT 9 0 - 32 IU/L  CBC with Differential/Platelet     Status: Abnormal   Collection Time: 06/28/15 11:03 AM  Result Value Ref Range   WBC 6.0 3.4 - 10.8 x10E3/uL   RBC 4.43 3.77 - 5.28 x10E6/uL   Hemoglobin 10.9 (L) 11.1 - 15.9 g/dL   Hematocrit 35.2 34.0 - 46.6 %   MCV 80 79 - 97 fL   MCH 24.6 (L) 26.6 - 33.0 pg   MCHC 31.0 (L) 31.5 - 35.7 g/dL   RDW 15.0 12.3 - 15.4 %   Platelets 317 150 - 379 x10E3/uL   Neutrophils 77 %   Lymphs 19 %   Monocytes 2 %   Eos 2 %   Basos 0 %   Neutrophils Absolute 4.6 1.4 - 7.0 x10E3/uL   Lymphocytes Absolute 1.1 0.7 - 3.1 x10E3/uL   Monocytes Absolute 0.1 0.1 - 0.9 x10E3/uL   EOS (ABSOLUTE) 0.1 0.0 - 0.4 x10E3/uL   Basophils Absolute 0.0 0.0 - 0.2 x10E3/uL   Immature Granulocytes 0 %    Immature Grans (Abs) 0.0 0.0 - 0.1 x10E3/uL  Vit D  25 hydroxy (rtn osteoporosis monitoring)     Status: Abnormal   Collection Time: 06/28/15 11:03 AM  Result Value Ref Range   Vit D, 25-Hydroxy 14.1 (L) 30.0 - 100.0 ng/mL    Comment: Vitamin D deficiency has been defined by the Institute of Medicine and an Endocrine Society  practice guideline as a level of serum 25-OH vitamin D less than 20 ng/mL (1,2). The Endocrine Society went on to further define vitamin D insufficiency as a level between 21 and 29 ng/mL (2). 1. IOM (Institute of Medicine). 2010. Dietary reference    intakes for calcium and D. [place]: The    [employer]. 2. Holick MF, Binkley [place], Bischoff-Ferrari HA, et al.    Evaluation, treatment, and prevention of vitamin D    deficiency: an Endocrine Society clinical practice    guideline. JCEM. 2011 Jul; 96(7):1911-30.   Vitamin B12     Status: None   Collection Time: 06/28/15 11:03 AM  Result Value Ref Range   Vitamin B-12 787 211 - 946 pg/mL     PHQ2/9: Depression screen PHQ 2/9 06/13/2015  Decreased Interest 0  Down, Depressed, Hopeless 0  PHQ - 2 Score 0     Fall Risk: Fall Risk  06/13/2015  Falls in the past year? Yes  Number falls in past yr: 1  Injury with Fall? No     Assessment & Plan  1. History of iron deficiency anemia  - Ferritin  2. Vitamin D deficiency  - VITAMIN D 25 Hydroxy (Vit-D Deficiency, Fractures)  3. Hypertension, benign  At goal, continue medication   4. B12 deficiency  - Vitamin B12  5. Depression, major, recurrent, moderate ([facility])  - DULoxetine (CYMBALTA) 60 MG capsule; Take 1 capsule (60 mg total) by mouth daily.  Dispense: 90 capsule; Refill: 0  6. Chronic kidney disease (CKD), stage III (moderate)  - Phosphorus - BASIC METABOLIC PANEL WITH GFR  7. Primary osteoarthritis of knee, right  Follow up with Ortho   8. Gastroesophageal reflux disease without esophagitis  - ranitidine (ZANTAC)  150 MG tablet; Take 1 tablet (150 mg total) by mouth 2 (two) times daily.  Dispense: 60 tablet; Refill: 2

## 2015-08-11 NOTE — Addendum Note (Signed)
Addended by: Inda Coke on: 08/11/2015 04:53 PM   Modules accepted: Orders

## 2015-08-11 NOTE — Patient Instructions (Signed)
Food Choices for Gastroesophageal Reflux Disease, Adult When you have gastroesophageal reflux disease (GERD), the foods you eat and your eating habits are very important. Choosing the right foods can help ease the discomfort of GERD. WHAT GENERAL GUIDELINES DO I NEED TO FOLLOW?  Choose fruits, vegetables, whole grains, low-fat dairy products, and low-fat meat, fish, and poultry.  Limit fats such as oils, salad dressings, butter, nuts, and avocado.  Keep a food diary to identify foods that cause symptoms.  Avoid foods that cause reflux. These may be different for different people.  Eat frequent small meals instead of three large meals each day.  Eat your meals slowly, in a relaxed setting.  Limit fried foods.  Cook foods using methods other than frying.  Avoid drinking alcohol.  Avoid drinking large amounts of liquids with your meals.  Avoid bending over or lying down until 2-3 hours after eating. WHAT FOODS ARE NOT RECOMMENDED? The following are some foods and drinks that may worsen your symptoms: Vegetables Tomatoes. Tomato juice. Tomato and spaghetti sauce. Chili peppers. Onion and garlic. Horseradish. Fruits Oranges, grapefruit, and lemon (fruit and juice). Meats High-fat meats, fish, and poultry. This includes hot dogs, ribs, ham, sausage, salami, and bacon. Dairy Whole milk and chocolate milk. Sour cream. Cream. Butter. Ice cream. Cream cheese.  Beverages Coffee and tea, with or without caffeine. Carbonated beverages or energy drinks. Condiments Hot sauce. Barbecue sauce.  Sweets/Desserts Chocolate and cocoa. Donuts. Peppermint and spearmint. Fats and Oils High-fat foods, including French fries and potato chips. Other Vinegar. Strong spices, such as black pepper, white pepper, red pepper, cayenne, curry powder, cloves, ginger, and chili powder. The items listed above may not be a complete list of foods and beverages to avoid. Contact your dietitian for more  information.   This information is not intended to replace advice given to you by your health care provider. Make sure you discuss any questions you have with your health care provider.   Document Released: 08/05/2005 Document Revised: 08/26/2014 Document Reviewed: 06/09/2013 Elsevier Interactive Patient Education 2016 [employer]  

## 2015-08-25 ENCOUNTER — Telehealth: Payer: Self-pay | Admitting: Family Medicine

## 2015-08-25 MED ORDER — VITAMIN D (ERGOCALCIFEROL) 1.25 MG (50000 UNIT) PO CAPS: 50000.0000 [IU] | ORAL_CAPSULE | ORAL | Status: DC

## 2015-08-25 NOTE — Telephone Encounter (Signed)
done

## 2015-08-25 NOTE — Telephone Encounter (Signed)
PT SAID THAT SHE WAS HERE 2 WKS AGO ON FRI AND WAS TO HAVE BEEN GIVEN A RX FOR VIT D BUT DID NOT GET IT. COULD YOU PLEASE CALL THIS INTO [facility] ON [address]

## 2015-08-28 ENCOUNTER — Ambulatory Visit: Payer: Federal, State, Local not specified - PPO | Admitting: Cardiovascular Disease

## 2015-10-23 ENCOUNTER — Ambulatory Visit: Payer: Federal, State, Local not specified - PPO | Admitting: Cardiovascular Disease

## 2015-11-09 ENCOUNTER — Ambulatory Visit: Payer: Federal, State, Local not specified - PPO | Admitting: Family Medicine

## 2015-11-12 ENCOUNTER — Other Ambulatory Visit: Payer: Self-pay | Admitting: Family Medicine

## 2015-11-14 ENCOUNTER — Ambulatory Visit (INDEPENDENT_AMBULATORY_CARE_PROVIDER_SITE_OTHER): Payer: Federal, State, Local not specified - PPO | Admitting: Cardiology

## 2015-11-14 ENCOUNTER — Encounter: Payer: Self-pay | Admitting: Cardiology

## 2015-11-14 VITALS — BP 128/88 | HR 78 | Ht 72.0 in | Wt 227.5 lb

## 2015-11-14 DIAGNOSIS — R0602 Shortness of breath: Secondary | ICD-10-CM

## 2015-11-14 DIAGNOSIS — R079 Chest pain, unspecified: Secondary | ICD-10-CM

## 2015-11-14 DIAGNOSIS — I10 Essential (primary) hypertension: Secondary | ICD-10-CM

## 2015-11-14 MED ORDER — ASPIRIN EC 81 MG PO TBEC: 81.0000 mg | DELAYED_RELEASE_TABLET | Freq: Every day | ORAL | Status: DC

## 2015-11-14 NOTE — Patient Instructions (Addendum)
Medication Instructions:  Your physician has recommended you make the following change in your medication: Start 81 mg Aspirin once daily   Labwork: None ordered  Testing/Procedures: Your physician has requested that you have an echocardiogram. Echocardiography is a painless test that uses sound waves to create images of your heart. It provides your doctor with information about the size and shape of your heart and how well your heart's chambers and valves are working. This procedure takes approximately one hour. There are no restrictions for this procedure.  Date & Time:_________________________________________________________________  Your physician has requested that you have a lexiscan myoview. For further information please visit [email or web address]. Please follow instruction sheet, as given.  Date & Time: ____Friday November 17, 2015 at 08:30 AM_____________________________  Follow-Up: Your physician recommends that you schedule a follow-up appointment after testing to review results.  Date & Time: ________________________________________________________________   Any Other Special Instructions Will Be Listed Below (If Applicable).  [place]  Your caregiver has ordered a Stress Test with nuclear imaging. The purpose of this test is to evaluate the blood supply to your heart muscle. This procedure is referred to as a "Non-Invasive Stress Test." This is because other than having an IV started in your vein, nothing is inserted or "invades" your body. Cardiac stress tests are done to find areas of poor blood flow to the heart by determining the extent of coronary artery disease (CAD). Some patients exercise on a treadmill, which naturally increases the blood flow to your heart, while others who are  unable to walk on a treadmill due to physical limitations have a pharmacologic/chemical stress agent called Lexiscan . This medicine will mimic walking on a treadmill by temporarily  increasing your coronary blood flow.   Please note: these test may take anywhere between 2-4 hours to complete  PLEASE REPORT TO [place] AT THE FIRST DESK WILL DIRECT YOU WHERE TO GO  Date of Procedure:__Friday November 17, 2015 at 08:30AM______________  Arrival Time for Procedure:__Arrive at 08:15AM________________   PLEASE NOTIFY THE OFFICE AT LEAST 24 HOURS IN ADVANCE IF YOU ARE UNABLE TO [place].  [phone] AND  PLEASE NOTIFY NUCLEAR MEDICINE AT [facility] AT LEAST 24 HOURS IN ADVANCE IF YOU ARE UNABLE TO KEEP YOUR APPOINTMENT. [phone]  How to prepare for your Myoview test:   Do not eat or drink after midnight  No caffeine for 24 hours prior to test  No smoking 24 hours prior to test.  Your medication may be taken with water.  If your doctor stopped a medication because of this test, do not take that medication.  Ladies, please do not wear dresses.  Skirts or pants are appropriate. Please wear a short sleeve shirt.  No perfume, cologne or lotion.  Wear comfortable walking shoes. No heels!             If you need a refill on your cardiac medications before your next appointment, please call your pharmacy.  Echocardiogram An echocardiogram, or echocardiography, uses sound waves (ultrasound) to produce an image of your heart. The echocardiogram is simple, painless, obtained within a short period of time, and offers valuable information to your health care provider. The images from an echocardiogram can provide information such as:  Evidence of coronary artery disease (CAD).  Heart size.  Heart muscle function.  Heart valve function.  Aneurysm detection.  Evidence of a past heart attack.  Fluid buildup around the heart.  Heart muscle thickening.  Assess heart  valve function. LET [facility] CARE PROVIDER KNOW ABOUT:  Any allergies you have.  All medicines you are taking, including vitamins, herbs, eye  drops, creams, and over-the-counter medicines.  Previous problems you or members of your family have had with the use of anesthetics.  Any blood disorders you have.  Previous surgeries you have had.  Medical conditions you have.  Possibility of pregnancy, if this applies. BEFORE THE PROCEDURE  No special preparation is needed. Eat and drink normally.  PROCEDURE   In order to produce an image of your heart, gel will be applied to your chest and a wand-like tool (transducer) will be moved over your chest. The gel will help transmit the sound waves from the transducer. The sound waves will harmlessly bounce off your heart to allow the heart images to be captured in real-time motion. These images will then be recorded.  You may need an IV to receive a medicine that improves the quality of the pictures. AFTER THE PROCEDURE You may return to your normal schedule including diet, activities, and medicines, unless your health care provider tells you otherwise.   This information is not intended to replace advice given to you by your health care provider. Make sure you discuss any questions you have with your health care provider.   Document Released: 08/02/2000 Document Revised: 08/26/2014 Document Reviewed: 04/12/2013 Elsevier Interactive Patient Education 2016 [place].    Pharmacologic Stress Electrocardiogram A pharmacologic stress electrocardiogram is a heart (cardiac) test that uses nuclear imaging to evaluate the blood supply to your heart. This test may also be called a pharmacologic stress electrocardiography. Pharmacologic means that a medicine is used to increase your heart rate and blood pressure.  This stress test is done to find areas of poor blood flow to the heart by determining the extent of coronary artery disease (CAD). Some people exercise on a treadmill, which naturally increases the blood flow to the heart. For those people unable to exercise on a treadmill, a medicine  is used. This medicine stimulates your heart and will cause your heart to beat harder and more quickly, as if you were exercising.  Pharmacologic stress tests can help determine:  The adequacy of blood flow to your heart during increased levels of activity in order to clear you for discharge home.  The extent of coronary artery blockage caused by CAD.  Your prognosis if you have suffered a heart attack.  The effectiveness of cardiac procedures done, such as an angioplasty, which can increase the circulation in your coronary arteries.  Causes of chest pain or pressure. LET [facility] CARE PROVIDER KNOW ABOUT:  Any allergies you have.  All medicines you are taking, including vitamins, herbs, eye drops, creams, and over-the-counter medicines.  Previous problems you or members of your family have had with the use of anesthetics.  Any blood disorders you have.  Previous surgeries you have had.  Medical conditions you have.  Possibility of pregnancy, if this applies.  If you are currently breastfeeding. RISKS AND COMPLICATIONS Generally, this is a safe procedure. However, as with any procedure, complications can occur. Possible complications include:  You develop pain or pressure in the following areas:  Chest.  Jaw or neck.  Between your shoulder blades.  Radiating down your left arm.  Headache.  Dizziness or light-headedness.  Shortness of breath.  Increased or irregular heartbeat.  Low blood pressure.  Nausea or vomiting.  Flushing.  Redness going up the arm and slight pain during injection of  medicine.  Heart attack (rare). BEFORE THE PROCEDURE   Avoid all forms of caffeine for 24 hours before your test or as directed by your health care provider. This includes coffee, tea (even decaffeinated tea), caffeinated sodas, chocolate, cocoa, and certain pain medicines.  Follow your health care provider's instructions regarding eating and drinking before the  test.  Take your medicines as directed at regular times with water unless instructed otherwise. Exceptions may include:  If you have diabetes, ask how you are to take your insulin or pills. It is common to adjust insulin dosing the morning of the test.  If you are taking beta-blocker medicines, it is important to talk to your health care provider about these medicines well before the date of your test. Taking beta-blocker medicines may interfere with the test. In some cases, these medicines need to be changed or stopped 24 hours or more before the test.  If you wear a nitroglycerin patch, it may need to be removed prior to the test. Ask your health care provider if the patch should be removed before the test.  If you use an inhaler for any breathing condition, bring it with you to the test.  If you are an outpatient, bring a snack so you can eat right after the stress phase of the test.  Do not smoke for 4 hours prior to the test or as directed by your health care provider.  Do not apply lotions, powders, creams, or oils on your chest prior to the test.  Wear comfortable shoes and clothing. Let your health care provider know if you were unable to complete or follow the preparations for your test. PROCEDURE   Multiple patches (electrodes) will be put on your chest. If needed, small areas of your chest may be shaved to get better contact with the electrodes. Once the electrodes are attached to your body, multiple wires will be attached to the electrodes, and your heart rate will be monitored.  An IV access will be started. A nuclear trace (isotope) is given. The isotope may be given intravenously, or it may be swallowed. Nuclear refers to several types of radioactive isotopes, and the nuclear isotope lights up the arteries so that the nuclear images are clear. The isotope is absorbed by your body. This results in low radiation exposure.  A resting nuclear image is taken to show how your heart  functions at rest.  A medicine is given through the IV access.  A second scan is done about 1 hour after the medicine injection and determines how your heart functions under stress.  During this stress phase, you will be connected to an electrocardiogram machine. Your blood pressure and oxygen levels will be monitored. AFTER THE PROCEDURE   Your heart rate and blood pressure will be monitored after the test.  You may return to your normal schedule, including diet,activities, and medicines, unless your health care provider tells you otherwise.   This information is not intended to replace advice given to you by your health care provider. Make sure you discuss any questions you have with your health care provider.   Document Released: 12/22/2008 Document Revised: 08/10/2013 Document Reviewed: 04/12/2013 Elsevier Interactive Patient Education [employer].

## 2015-11-14 NOTE — Progress Notes (Signed)
Cardiology Office Note   Date:  11/14/2015   ID:  [name], DOB 10/29/1950, MRN [id number]  Referring Doctor:  Loistine Chance, MD   Cardiologist:   Wende Bushy, MD   Reason for consultation:  Chief Complaint  Patient presents with  . other    Chest pain. Meds reviewed verbally with pt.      History of Present Illness: Melissa [name] is a 65 y.o. female who presents for Chest pain. This has been going on for several months now here at she describes the pain as a dull ache in the center of the chest sometimes radiates into the back, mild in intensity, waxes and wanes throughout the day, not related to anything in particular. Last 20 minutes to one hour at times. Patient thinks this is related to having gas but her PCP wants her to see her doctor for further workup.  Patient also has shortness of breath going on for years. She thinks it is getting worse over time. She attributes this to weight gain. The shortness breath is more noticeable with inclined or walking at a faster rate.  Patient denies headache, fever, cough, colds, abdominal pain, PND, orthopnea, edema.  In terms of her hypertension, blood pressure has been under control. She feels that it is doing well with the medication that she takes for it.   ROS:  Please see the history of present illness. Aside from mentioned under HPI, all other systems are reviewed and negative.     Past Medical History  Diagnosis Date  . Edema   . RLS (restless legs syndrome)   . Iron deficiency anemia   . Spinal stenosis   . Depression   . Bruising   . Cervical radiculopathy   . HTN (hypertension)   . OA (osteoarthritis)   . Deficiency of vitamin B   . Snoring   . Perennial allergic rhinitis   . Osteopenia   . Sickle cell trait ([place])   . Obesity   . Leukopenia   . Renal cysts, acquired, bilateral   . Flank pain   . Gross hematuria     Past Surgical History  Procedure Laterality Date  . Bariatric surgery     . Gastric bypass    . Cesarean section      3 or more  . Tubal ligation       reports that she has never smoked. She has never used smokeless tobacco. She reports that she does not drink alcohol or use illicit drugs.   family history includes Alcohol abuse in her brother and father; Diabetes Mellitus II in her sister; Heart disease in her mother; Hypercholesterolemia in her mother; Hypertension in her mother; Lung cancer in her brother. CHF and mother and nephew.  Current Outpatient Prescriptions  Medication Sig Dispense Refill  . DULoxetine (CYMBALTA) 60 MG capsule Take 1 capsule (60 mg total) by mouth daily. 90 capsule 0  . ferrous sulfate 325 (65 FE) MG EC tablet Take 325 mg by mouth 3 (three) times daily with meals.    . fluticasone (FLONASE) 50 MCG/ACT nasal spray Place 2 sprays into both nostrils as needed.   2  . losartan (COZAAR) 25 MG tablet Take 1 tablet (25 mg total) by mouth daily. 90 tablet 1  . magnesium oxide (MAG-OX) 400 MG tablet Take 400 mg by mouth daily.    . ranitidine (ZANTAC) 150 MG tablet TAKE 1 TABLET (150 MG TOTAL) BY MOUTH 2 (TWO) TIMES DAILY.  60 tablet 2  . Vitamin D, Ergocalciferol, (DRISDOL) 50000 units CAPS capsule Take 1 capsule (50,000 Units total) by mouth every 7 (seven) days. 12 capsule 0  . aspirin EC 81 MG tablet Take 1 tablet (81 mg total) by mouth daily. 90 tablet 3   No current facility-administered medications for this visit.    Allergies: Contrast media and Shellfish allergy    PHYSICAL EXAM: VS:  BP 128/88 mmHg  Pulse 78  Ht 6' (1.829 m)  Wt 227 lb 8 oz (103.193 kg)  BMI 30.85 kg/m2 , Body mass index is 30.85 kg/(m^2). Wt Readings from Last 3 Encounters:  11/14/15 227 lb 8 oz (103.193 kg)  08/11/15 240 lb 8 oz (109.09 kg)  06/13/15 242 lb 12.8 oz (110.133 kg)    GENERAL:  well developed, well nourished,obese, not in acute distress HEENT: normocephalic, pink conjunctivae, anicteric sclerae, no xanthelasma, normal dentition,  oropharynx clear NECK:  no neck vein engorgement, JVP normal, no hepatojugular reflux, carotid upstroke brisk and symmetric, no bruit, no thyromegaly, no lymphadenopathy LUNGS:  good respiratory effort, clear to auscultation bilaterally CV:  PMI not displaced, no thrills, no lifts, S1 and S2 within normal limits, no palpable S3 or S4, no murmurs, no rubs, no gallops ABD:  Soft, nontender, nondistended, normoactive bowel sounds, no abdominal aortic bruit, no hepatomegaly, no splenomegaly MS: nontender back, no kyphosis, no scoliosis, no joint deformities EXT:  2+ DP/PT pulses, no edema, no varicosities, no cyanosis, no clubbing SKIN: warm, nondiaphoretic, normal turgor, no ulcers NEUROPSYCH: alert, oriented to person, place, and time, sensory/motor grossly intact, normal mood, appropriate affect  Recent Labs: 06/28/2015: ALT 9; BUN 14; Creatinine, Ser 1.19*; Platelets 317; Potassium 4.3; Sodium 141   Lipid Panel    Component Value Date/Time   CHOL 256* 06/28/2015 1103   TRIG 108 06/28/2015 1103   HDL 92 06/28/2015 1103   CHOLHDL 2.8 06/28/2015 1103   LDLCALC 142* 06/28/2015 1103     Other studies Reviewed:  EKG:  EKG is ordered today. The ekg from 11/14/2015 was personally reviewed by me and it revealed sinus rhythm 78 BPM.  Additional studies/ records that were reviewed personally reviewed by me today include: None available   ASSESSMENT AND PLAN:  Chest pain Shortness of breath Risk factors for coronary artery disease include age, postmenopausal state, hypertension, obesity. Recommend pharmacologic nuclear stress test to rule out ischemia. Patient will be unable to walk on the treadmill due to back issues/bulging disc/back pain. Recommend echocardiogram as well.  Hypertension BP is well controlled. Continue monitoring BP. Continue current medical therapy and lifestyle changes.  Obesity Body mass index is 30.85 kg/(m^2).Marland Kitchen Recommend aggressive weight loss through diet and  increased physical activity, Once cardiac workup was done.   Current medicines are reviewed at length with the patient today.  The patient does not have concerns regarding medicines.  Labs/ tests ordered today include:  Orders Placed This Encounter  Procedures  . NM Myocar Multi W/Spect W/Wall Motion / EF  . EKG 12-Lead  . ECHOCARDIOGRAM COMPLETE    I had a lengthy and detailed discussion with the patient regarding diagnoses, prognosis, diagnostic options, treatment options.   I counseled the patient on importance of lifestyle modification including heart healthy diet, regular physical activity.   Disposition:   FU with undersigned after tests   Signed, Wende Bushy, MD  11/14/2015 3:10 PM    [place]

## 2015-11-16 ENCOUNTER — Telehealth: Payer: Self-pay | Admitting: Cardiology

## 2015-11-16 NOTE — Telephone Encounter (Signed)
Called and reviewed instructions with patient for her lexiscan tomorrow morning and she had no further questions.

## 2015-11-17 ENCOUNTER — Encounter
Admission: RE | Admit: 2015-11-17 | Discharge: 2015-11-17 | Disposition: A | Discharge status: Home / Self Care | Payer: Federal, State, Local not specified - PPO | Source: Ambulatory Visit | Attending: Cardiology | Admitting: Cardiology

## 2015-11-17 DIAGNOSIS — I10 Essential (primary) hypertension: Secondary | ICD-10-CM | POA: Diagnosis not present

## 2015-11-17 DIAGNOSIS — R079 Chest pain, unspecified: Secondary | ICD-10-CM | POA: Insufficient documentation

## 2015-11-17 DIAGNOSIS — R0602 Shortness of breath: Secondary | ICD-10-CM | POA: Diagnosis not present

## 2015-11-17 LAB — NM MYOCAR MULTI W/SPECT W/WALL MOTION / EF
Estimated workload: 1 METS
Exercise duration (min): 0 min
Exercise duration (sec): 0 s
LV dias vol: 75 mL (ref 46–106)
LV sys vol: 32 mL
MPHR: 158 {beats}/min
Peak HR: 110 {beats}/min
Percent HR: 69 %
Rest HR: 75 {beats}/min
SDS: 1
SRS: 10
SSS: 11
TID: 0.59

## 2015-11-17 MED ORDER — REGADENOSON 0.4 MG/5ML IV SOLN
0.4000 mg | Freq: Once | INTRAVENOUS | Status: AC
  Administered 2015-11-17: 0.4 mg via INTRAVENOUS
  Filled 2015-11-17: qty 5

## 2015-11-17 MED ORDER — TECHNETIUM TC 99M SESTAMIBI GENERIC - CARDIOLITE
13.9000 | Freq: Once | INTRAVENOUS | Status: AC | PRN
  Administered 2015-11-17: 13.9 via INTRAVENOUS

## 2015-11-17 MED ORDER — TECHNETIUM TC 99M SESTAMIBI GENERIC - CARDIOLITE
30.6000 | Freq: Once | INTRAVENOUS | Status: AC | PRN
  Administered 2015-11-17: 30.6 via INTRAVENOUS

## 2015-11-24 ENCOUNTER — Other Ambulatory Visit: Payer: Self-pay

## 2015-11-24 ENCOUNTER — Ambulatory Visit (INDEPENDENT_AMBULATORY_CARE_PROVIDER_SITE_OTHER): Payer: Federal, State, Local not specified - PPO

## 2015-11-24 DIAGNOSIS — I10 Essential (primary) hypertension: Secondary | ICD-10-CM

## 2015-11-24 DIAGNOSIS — R0602 Shortness of breath: Secondary | ICD-10-CM

## 2015-11-24 DIAGNOSIS — R079 Chest pain, unspecified: Secondary | ICD-10-CM | POA: Diagnosis not present

## 2015-11-29 ENCOUNTER — Ambulatory Visit (INDEPENDENT_AMBULATORY_CARE_PROVIDER_SITE_OTHER): Payer: Federal, State, Local not specified - PPO | Admitting: Cardiology

## 2015-11-29 ENCOUNTER — Encounter: Payer: Self-pay | Admitting: Cardiology

## 2015-11-29 ENCOUNTER — Other Ambulatory Visit: Payer: Self-pay | Admitting: Cardiovascular Disease

## 2015-11-29 VITALS — BP 120/88 | HR 87 | Ht 72.0 in | Wt 223.2 lb

## 2015-11-29 DIAGNOSIS — I10 Essential (primary) hypertension: Secondary | ICD-10-CM | POA: Diagnosis not present

## 2015-11-29 DIAGNOSIS — R931 Abnormal findings on diagnostic imaging of heart and coronary circulation: Secondary | ICD-10-CM | POA: Diagnosis not present

## 2015-11-29 NOTE — Patient Instructions (Signed)
Medication Instructions:  Your physician recommends that you continue on your current medications as directed. Please refer to the Current Medication list given to you today.   Labwork: None ordered  Testing/Procedures: Your physician has requested that you have an echocardiogram. Echocardiography is a painless test that uses sound waves to create images of your heart. It provides your doctor with information about the size and shape of your heart and how well your heart's chambers and valves are working. This procedure takes approximately one hour. There are no restrictions for this procedure.  Date & Time: _______Tuesday, December 05, 2015 at 10:00AM Arrive at 09:30AM to register___  If you need to reschedule please call [phone]  Follow-Up: Your physician recommends that you schedule a follow-up appointment after testing to review results.  Date & Time:_____________________________________________________________   Any Other Special Instructions Will Be Listed Below (If Applicable).     If you need a refill on your cardiac medications before your next appointment, please call your pharmacy.

## 2015-11-29 NOTE — Progress Notes (Signed)
Cardiology Office Note   Date:  11/29/2015   ID:  Melissa [name], DOB 12-Feb-1951, MRN [id number]  Referring Doctor:  Loistine Chance, MD   Cardiologist:   Wende Bushy, MD   Reason for consultation:  Chief Complaint  Patient presents with  . other    Follow up from stress test and echo. Meds reviewed by the patient verbally. "doing well."       History of Present Illness: Melissa [name] is a 65 y.o. female who presents for Follow-up after stress is an echocardiogram  No recurrence of chest pain. Patient also has some shortness of breath going on for years. She attributes this to weight gain. The shortness breath is more noticeable with inclined or walking at a faster rate.  Patient denies headache, fever, cough, colds, abdominal pain, PND, orthopnea, edema.     ROS:  Please see the history of present illness. Aside from mentioned under HPI, all other systems are reviewed and negative.     Past Medical History  Diagnosis Date  . Edema   . RLS (restless legs syndrome)   . Iron deficiency anemia   . Spinal stenosis   . Depression   . Bruising   . Cervical radiculopathy   . HTN (hypertension)   . OA (osteoarthritis)   . Deficiency of vitamin B   . Snoring   . Perennial allergic rhinitis   . Osteopenia   . Sickle cell trait ([place])   . Obesity   . Leukopenia   . Renal cysts, acquired, bilateral   . Flank pain   . Gross hematuria     Past Surgical History  Procedure Laterality Date  . Bariatric surgery    . Gastric bypass    . Cesarean section      3 or more  . Tubal ligation       reports that she has never smoked. She has never used smokeless tobacco. She reports that she does not drink alcohol or use illicit drugs.   family history includes Alcohol abuse in her brother and father; Diabetes Mellitus II in her sister; Heart disease in her mother; Hypercholesterolemia in her mother; Hypertension in her mother; Lung cancer in her brother. CHF and  mother and nephew.  Current Outpatient Prescriptions  Medication Sig Dispense Refill  . aspirin EC 81 MG tablet Take 1 tablet (81 mg total) by mouth daily. 90 tablet 3  . DULoxetine (CYMBALTA) 60 MG capsule Take 1 capsule (60 mg total) by mouth daily. 90 capsule 0  . ferrous sulfate 325 (65 FE) MG EC tablet Take 325 mg by mouth 3 (three) times daily with meals.    . fluticasone (FLONASE) 50 MCG/ACT nasal spray Place 2 sprays into both nostrils as needed.   2  . losartan (COZAAR) 25 MG tablet Take 1 tablet (25 mg total) by mouth daily. 90 tablet 1  . magnesium oxide (MAG-OX) 400 MG tablet Take 400 mg by mouth daily.    . ranitidine (ZANTAC) 150 MG tablet TAKE 1 TABLET (150 MG TOTAL) BY MOUTH 2 (TWO) TIMES DAILY. 60 tablet 2  . Vitamin D, Ergocalciferol, (DRISDOL) 50000 units CAPS capsule Take 1 capsule (50,000 Units total) by mouth every 7 (seven) days. 12 capsule 0   No current facility-administered medications for this visit.    Allergies: Contrast media and Shellfish allergy    PHYSICAL EXAM: VS:  BP 120/88 mmHg  Pulse 87  Ht 6' (1.829 m)  Wt 223 lb  4 oz (101.266 kg)  BMI 30.27 kg/m2  SpO2 98% , Body mass index is 30.27 kg/(m^2). Wt Readings from Last 3 Encounters:  11/29/15 223 lb 4 oz (101.266 kg)  11/14/15 227 lb 8 oz (103.193 kg)  08/11/15 240 lb 8 oz (109.09 kg)    GENERAL:  well developed, well nourished,obese, not in acute distress HEENT: normocephalic, pink conjunctivae, anicteric sclerae, no xanthelasma, normal dentition, oropharynx clear NECK:  no neck vein engorgement, JVP normal, no hepatojugular reflux, carotid upstroke brisk and symmetric, no bruit, no thyromegaly, no lymphadenopathy LUNGS:  good respiratory effort, clear to auscultation bilaterally CV:  PMI not displaced, no thrills, no lifts, S1 and S2 within normal limits, no palpable S3 or S4, no murmurs, no rubs, no gallops ABD:  Soft, nontender, nondistended, normoactive bowel sounds, no abdominal aortic  bruit, no hepatomegaly, no splenomegaly MS: nontender back, no kyphosis, no scoliosis, no joint deformities EXT:  2+ DP/PT pulses, no edema, no varicosities, no cyanosis, no clubbing SKIN: warm, nondiaphoretic, normal turgor, no ulcers NEUROPSYCH: alert, oriented to person, place, and time, sensory/motor grossly intact, normal mood, appropriate affect  Recent Labs: 06/28/2015: ALT 9; BUN 14; Creatinine, Ser 1.19*; Platelets 317; Potassium 4.3; Sodium 141   Lipid Panel    Component Value Date/Time   CHOL 256* 06/28/2015 1103   TRIG 108 06/28/2015 1103   HDL 92 06/28/2015 1103   CHOLHDL 2.8 06/28/2015 1103   LDLCALC 142* 06/28/2015 1103     Other studies Reviewed:  EKG:   The ekg from 11/14/2015 was personally reviewed by me and it revealed sinus rhythm 78 BPM.  Additional studies/ records that were reviewed personally reviewed by me today include:  Echocardiogram 11/24/2015: Left ventricle: The cavity size was normal. There was mild  concentric hypertrophy. Systolic function was moderately reduced.  The estimated ejection fraction was in the range of 35% to 40%.  Severe hypokinesis of the mid-apicalanteroseptal and anterior  myocardium. Doppler parameters are consistent with abnormal left  ventricular relaxation (grade 1 diastolic dysfunction). - Tricuspid valve: There was mild regurgitation. - Pulmonary arteries: Systolic pressure was within the normal  range.  Pharmacologic nuclear stress test 11/17/2015: Pharmacological myocardial perfusion imaging study with no significant ischemia Normal wall motion, EF estimated at 57% No EKG changes concerning for ischemia at peak stress or in recovery. Low risk scan   ASSESSMENT AND PLAN:  Chest pain Shortness of breath Risk factors for coronary artery disease include age, postmenopausal state, hypertension, obesity.  Discussed the findings of the stress test from 11/17/2015. No ischemia noted on that study. EF was normal  at 57%. Patient reassured. Likelihood of significant CAD was low with a normal stress test. Recommend echocardiogram with contrast to verify EF. Echocardiogram from 11/24/2015 me be of insufficient quality, poor endocardial border definition that may explain the low ejection fraction that was noted. Patient has no evidence of CHF.  Hypertension BP is well controlled. Continue monitoring BP. Continue current medical therapy and lifestyle changes.  Obesity Body mass index is 30.27 kg/(m^2).Marland Kitchen Recommend aggressive weight loss through diet and increased physical activity.  Current medicines are reviewed at length with the patient today.  The patient does not have concerns regarding medicines.  Labs/ tests ordered today include:  Orders Placed This Encounter  Procedures  . Echocardiogram    I had a lengthy and detailed discussion with the patient regarding diagnoses, prognosis, diagnostic options, treatment options.   I counseled the patient on importance of lifestyle modification including heart healthy diet,  regular physical activity.   Disposition:   FU with undersigned after tests   Signed, Wende Bushy, MD  11/29/2015 4:27 PM    [place]

## 2015-12-05 ENCOUNTER — Ambulatory Visit
Admission: RE | Admit: 2015-12-05 | Discharge: 2015-12-05 | Disposition: A | Discharge status: Home / Self Care | Payer: Federal, State, Local not specified - PPO | Source: Ambulatory Visit | Attending: Cardiology | Admitting: Cardiology

## 2015-12-05 DIAGNOSIS — R931 Abnormal findings on diagnostic imaging of heart and coronary circulation: Secondary | ICD-10-CM | POA: Insufficient documentation

## 2015-12-05 NOTE — Progress Notes (Signed)
*  PRELIMINARY RESULTS* Echocardiogram 2D Echocardiogram   has been performed.  Laqueta Jean Hege 12/05/2015, 11:01 AM

## 2015-12-08 ENCOUNTER — Encounter: Payer: Self-pay | Admitting: Family Medicine

## 2015-12-08 ENCOUNTER — Ambulatory Visit (INDEPENDENT_AMBULATORY_CARE_PROVIDER_SITE_OTHER): Payer: Federal, State, Local not specified - PPO | Admitting: Family Medicine

## 2015-12-08 VITALS — BP 136/80 | HR 122 | Temp 97.6°F | Resp 18 | Ht 72.0 in | Wt 221.3 lb

## 2015-12-08 DIAGNOSIS — R7303 Prediabetes: Secondary | ICD-10-CM | POA: Diagnosis not present

## 2015-12-08 DIAGNOSIS — E785 Hyperlipidemia, unspecified: Secondary | ICD-10-CM

## 2015-12-08 DIAGNOSIS — Z9884 Bariatric surgery status: Secondary | ICD-10-CM

## 2015-12-08 DIAGNOSIS — Z862 Personal history of diseases of the blood and blood-forming organs and certain disorders involving the immune mechanism: Secondary | ICD-10-CM | POA: Diagnosis not present

## 2015-12-08 DIAGNOSIS — R000 Tachycardia, unspecified: Secondary | ICD-10-CM

## 2015-12-08 DIAGNOSIS — F325 Major depressive disorder, single episode, in full remission: Secondary | ICD-10-CM

## 2015-12-08 DIAGNOSIS — E559 Vitamin D deficiency, unspecified: Secondary | ICD-10-CM

## 2015-12-08 DIAGNOSIS — E66811 Obesity, class 1: Secondary | ICD-10-CM

## 2015-12-08 DIAGNOSIS — I10 Essential (primary) hypertension: Secondary | ICD-10-CM

## 2015-12-08 DIAGNOSIS — E669 Obesity, unspecified: Secondary | ICD-10-CM | POA: Diagnosis not present

## 2015-12-08 DIAGNOSIS — N183 Chronic kidney disease, stage 3 unspecified: Secondary | ICD-10-CM

## 2015-12-08 DIAGNOSIS — R0789 Other chest pain: Secondary | ICD-10-CM

## 2015-12-08 DIAGNOSIS — J0110 Acute frontal sinusitis, unspecified: Secondary | ICD-10-CM

## 2015-12-08 DIAGNOSIS — E538 Deficiency of other specified B group vitamins: Secondary | ICD-10-CM

## 2015-12-08 MED ORDER — AMOXICILLIN-POT CLAVULANATE 875-125 MG PO TABS: 1.0000 | ORAL_TABLET | Freq: Two times a day (BID) | ORAL | Status: DC

## 2015-12-08 MED ORDER — VITAMIN D (ERGOCALCIFEROL) 1.25 MG (50000 UNIT) PO CAPS: 50000.0000 [IU] | ORAL_CAPSULE | ORAL | Status: DC

## 2015-12-08 MED ORDER — CYANOCOBALAMIN 1000 MCG/ML IJ SOLN
1000.0000 ug | Freq: Once | INTRAMUSCULAR | Status: AC
  Administered 2015-12-08: 1000 ug via INTRAMUSCULAR

## 2015-12-08 MED ORDER — LOSARTAN POTASSIUM 25 MG PO TABS: 25.0000 mg | ORAL_TABLET | Freq: Every day | ORAL | Status: DC

## 2015-12-08 NOTE — Addendum Note (Signed)
Addended by: Johnnette Litter A on: 12/08/2015 05:19 PM   Modules accepted: Orders

## 2015-12-08 NOTE — Progress Notes (Signed)
Name: Melissa [name]   MRN: [id number]    DOB: 04/18/1951   Date:12/08/2015       Progress Note  Subjective  Chief Complaint  Chief Complaint  Patient presents with  . Medication Refill    3 month F/U  . URI    Onset-Last Wednesday, getting worst. Went to Urgent Care on Tuesday was diagnostic with Sinusitis and was told to take Allergia D and Flonase. Patient is experiencing post-nasal drip, headaches, fatigue, accumlates in the back of her throat.   Marland Kitchen Hot Flashes    Onset-over the weekend, intermittently during the night and throughout the day. Happens more with movement.   . Hypertension    Little chest pain and is going to cardiology and they performed stress test and echocardiogram. They stated her echocardiogram and stress test did not match and they like to perform the echocardiogram with dye done on Tuesday. But they state the second one was better but it is contributed to her BP.   Marland Kitchen Gastroesophageal Reflux    Well controlled  . Depression    Patient slowly titrated down on the Duloxetine and is no longer taking it.     HPI  Sinus problem: went to Urgent Care two days because for the past 10 days she has noticed sinus pressure, post-nasal drainage, she was given Allegra D and Flonase, but she states symptoms are not getting better. She is also feeling very tired, but no fever  Major Depression: in remission now, weaned self off medication and is doing well.   HTN: she has been taking medication as prescribed. Denies side effects. She has some chest pain, and over the past couple of days some palpitation  Chest pain: right now until unknown cause, seen by Cardiologist, normal stress test but abnormal Echo, second study still does not have results. Not associated with meals.   History of gastric bypass: she had surgery in 2002, weight before surgery was 330 lbs, she went down to 175 lbs but gradually gained weight but since December, she has resume her diet and has lost 19  lbs. Not physically active but she will start soon.   CKI: function has improved, we will recheck labs.    Patient Active Problem List   Diagnosis Date Noted  . Gastroesophageal reflux disease without esophagitis 08/11/2015  . Chronic kidney disease (CKD), stage III (moderate) 07/02/2015  . Primary osteoarthritis of knee 06/13/2015  . Hip bursitis 06/13/2015  . RLS (restless legs syndrome) 06/13/2015  . Depression, major, in remission ([place]) 06/13/2015  . B12 deficiency 06/13/2015  . History of Roux-en-Y gastric bypass 06/13/2015  . History of iron deficiency anemia 06/13/2015  . Vitamin D deficiency 06/13/2015  . Hypertension, benign 06/13/2015  . Allergic rhinitis, seasonal 06/13/2015  . Obesity (BMI 30.0-34.9) 06/13/2015  . Cervical radiculitis 01/04/2014  . Cervical spinal stenosis 01/04/2014  . Cervical osteoarthritis 01/04/2014    Past Surgical History  Procedure Laterality Date  . Bariatric surgery    . Gastric bypass    . Cesarean section      3 or more  . Tubal ligation      Family History  Problem Relation Age of Onset  . Hypercholesterolemia Mother   . Heart disease Mother   . Hypertension Mother   . Lung cancer Brother   . Alcohol abuse Father   . Alcohol abuse Brother   . Diabetes Mellitus II Sister     Social History   Social History  . Marital  Status: Divorced    Spouse Name: N/A  . Number of Children: N/A  . Years of Education: N/A   Occupational History  . Not on file.   Social History Main Topics  . Smoking status: Never Smoker   . Smokeless tobacco: Never Used  . Alcohol Use: No  . Drug Use: No  . Sexual Activity: Not Currently   Other Topics Concern  . Not on file   Social History Narrative     Current outpatient prescriptions:  .  aspirin EC 81 MG tablet, Take 1 tablet (81 mg total) by mouth daily., Disp: 90 tablet, Rfl: 3 .  ferrous sulfate 325 (65 FE) MG EC tablet, Take 325 mg by mouth 3 (three) times daily with meals.,  Disp: , Rfl:  .  fluticasone (FLONASE) 50 MCG/ACT nasal spray, Place 2 sprays into both nostrils as needed. , Disp: , Rfl: 2 .  losartan (COZAAR) 25 MG tablet, Take 1 tablet (25 mg total) by mouth daily., Disp: 90 tablet, Rfl: 1 .  magnesium oxide (MAG-OX) 400 MG tablet, Take 400 mg by mouth daily. Reported on 12/08/2015, Disp: , Rfl:  .  Vitamin D, Ergocalciferol, (DRISDOL) 50000 units CAPS capsule, Take 1 capsule (50,000 Units total) by mouth every 7 (seven) days., Disp: 12 capsule, Rfl: 0  Allergies  Allergen Reactions  . Contrast Media [Iodinated Diagnostic Agents]   . Shellfish Allergy     Edema     ROS  Constitutional: Negative for fever, positive for weight change.  Respiratory: Negative for cough and shortness of breath.   Cardiovascular: Positive  for chest pain , mild  palpitations.  Gastrointestinal: Negative for abdominal pain, no bowel changes.  Musculoskeletal: Negative for gait problem or joint swelling.  Skin: Negative for rash.  Neurological: Positive  for dizziness past couple of days, frontal headache since sinus pain .  No other specific complaints in a complete review of systems (except as listed in HPI above).  Objective  Filed Vitals:   12/08/15 1639  BP: 136/80  Pulse: 122  Temp: 97.6 F (36.4 C)  TempSrc: Oral  Resp: 18  Height: 6' (1.829 m)  Weight: 221 lb 4.8 oz (100.381 kg)  SpO2: 97%    Body mass index is 30.01 kg/(m^2).  Physical Exam  Constitutional: Patient appears well-developed and well-nourished. Obese  No distress.  HEENT: head atraumatic, normocephalic, pupils equal and reactive to light, ears normal TM bilaterally, tender during percussion of left frontal sinus, boggy turbinates bilaterally, neck supple, throat within normal limits Cardiovascular: Normal rate, regular rhythm and normal heart sounds.  No murmur heard. No BLE edema. Mild tenderness during palpation of right anterior chest  Pulmonary/Chest: Effort normal and breath  sounds normal. No respiratory distress. Abdominal: Soft.  There is no tenderness. Psychiatric: Patient has a normal mood and affect. behavior is normal. Judgment and thought content normal.  Recent Results (from the past 2160 hour(s))  NM Myocar Multi W/Spect W/Wall Motion / EF     Status: None   Collection Time: 11/17/15 10:56 AM  Result Value Ref Range   Rest HR 75 bpm   Rest BP 150/94 mmHg   Exercise duration (min) 0 min   Exercise duration (sec) 0 sec   Estimated workload 1.0 METS   Peak HR 110 bpm   Peak BP 154/81 mmHg   MPHR 158 bpm   Percent HR 69 %   RPE     LV sys vol 32 mL   TID 0.59  LV dias vol 75 46 - 106 mL   LHR     SSS 11    SRS 10    SDS 1      PHQ2/9: Depression screen [facility] 2/9 12/08/2015 06/13/2015  Decreased Interest 0 0  Down, Depressed, Hopeless 0 0  PHQ - 2 Score 0 0    Fall Risk: Fall Risk  12/08/2015 06/13/2015  Falls in the past year? Yes Yes  Number falls in past yr: 1 1  Injury with Fall? No No     Functional Status Survey: Is the patient deaf or have difficulty hearing?: No Does the patient have difficulty seeing, even when wearing glasses/contacts?: No Does the patient have difficulty concentrating, remembering, or making decisions?: No Does the patient have difficulty walking or climbing stairs?: No Does the patient have difficulty dressing or bathing?: No Does the patient have difficulty doing errands alone such as visiting a doctor's office or shopping?: No    Assessment & Plan  1. Hypertension, benign  At goal  - losartan (COZAAR) 25 MG tablet; Take 1 tablet (25 mg total) by mouth daily.  Dispense: 90 tablet; Refill: 1 - Comprehensive metabolic panel  2. Depression, major, in remission ([place])  Weaned self off, doing well, in remission   3. Vitamin D deficiency  - Vitamin D, Ergocalciferol, (DRISDOL) 50000 units CAPS capsule; Take 1 capsule (50,000 Units total) by mouth every 7 (seven) days.  Dispense: 12 capsule;  Refill: 0  4. Obesity (BMI 30.0-34.9)  Doing better with dietary modification   5. History of Roux-en-Y gastric bypass  Lost 19 lbs since last visit, but dietary modification, stopped sweet beverages and starches.   6. Chronic kidney disease (CKD), stage III (moderate)  Recheck labs  7. Chest tightness  Seen by Cardiologist, normal stress test, but questionable Echo, had repeat Echo this week and follow up with Cardiologist in 3 months, only on aspirin and Losartan, not related to meals and Ranitidine did not help with symptoms. Explained that if persists may still need EGD because of history of bariatric surgery  8. Prediabetes  - Hemoglobin A1c  9. Acute frontal sinusitis, recurrence not specified  - amoxicillin-clavulanate (AUGMENTIN) 875-125 MG tablet; Take 1 tablet by mouth 2 (two) times daily.  Dispense: 20 tablet; Refill: 0  10. Tachycardia  Needs to stop taking Allegra D given by Urgent Care - we will check CBC and TSH - TSH  11. Dyslipidemia  - Lipid panel  12. History of iron deficiency anemia  - CBC with Differential/Platelet - Ferritin

## 2015-12-12 ENCOUNTER — Ambulatory Visit: Payer: Federal, State, Local not specified - PPO | Admitting: Cardiology

## 2015-12-16 ENCOUNTER — Other Ambulatory Visit
Admission: RE | Admit: 2015-12-16 | Discharge: 2015-12-16 | Disposition: A | Discharge status: Home / Self Care | Payer: Federal, State, Local not specified - PPO | Source: Ambulatory Visit | Attending: Family Medicine | Admitting: Family Medicine

## 2015-12-16 DIAGNOSIS — R000 Tachycardia, unspecified: Secondary | ICD-10-CM | POA: Diagnosis present

## 2015-12-16 DIAGNOSIS — I10 Essential (primary) hypertension: Secondary | ICD-10-CM | POA: Diagnosis not present

## 2015-12-16 DIAGNOSIS — E785 Hyperlipidemia, unspecified: Secondary | ICD-10-CM | POA: Insufficient documentation

## 2015-12-16 DIAGNOSIS — R7303 Prediabetes: Secondary | ICD-10-CM | POA: Diagnosis not present

## 2015-12-16 DIAGNOSIS — Z862 Personal history of diseases of the blood and blood-forming organs and certain disorders involving the immune mechanism: Secondary | ICD-10-CM | POA: Diagnosis not present

## 2015-12-16 LAB — CBC WITH DIFFERENTIAL/PLATELET
Basophils Absolute: 0 10*3/uL (ref 0–0.1)
Basophils Relative: 1 %
Eosinophils Absolute: 0.1 10*3/uL (ref 0–0.7)
Eosinophils Relative: 2 %
HCT: 37.3 % (ref 35.0–47.0)
Hemoglobin: 12.3 g/dL (ref 12.0–16.0)
Lymphocytes Relative: 34 %
Lymphs Abs: 1.9 10*3/uL (ref 1.0–3.6)
MCH: 28.3 pg (ref 26.0–34.0)
MCHC: 33.1 g/dL (ref 32.0–36.0)
MCV: 85.4 fL (ref 80.0–100.0)
Monocytes Absolute: 0.7 10*3/uL (ref 0.2–0.9)
Monocytes Relative: 13 %
Neutro Abs: 2.8 10*3/uL (ref 1.4–6.5)
Neutrophils Relative %: 50 %
Platelets: 230 10*3/uL (ref 150–440)
RBC: 4.36 MIL/uL (ref 3.80–5.20)
RDW: 13.8 % (ref 11.5–14.5)
WBC: 5.6 10*3/uL (ref 3.6–11.0)

## 2015-12-16 LAB — LIPID PANEL
Cholesterol: 229 mg/dL — ABNORMAL HIGH (ref 0–200)
HDL: 64 mg/dL (ref >40)
LDL Cholesterol: 150 mg/dL — ABNORMAL HIGH (ref 0–99)
Total CHOL/HDL Ratio: 3.6 RATIO
Triglycerides: 74 mg/dL (ref <150)
VLDL: 15 mg/dL (ref 0–40)

## 2015-12-16 LAB — COMPREHENSIVE METABOLIC PANEL
ALT: 11 U/L — ABNORMAL LOW (ref 14–54)
AST: 21 U/L (ref 15–41)
Albumin: 4 g/dL (ref 3.5–5.0)
Alkaline Phosphatase: 79 U/L (ref 38–126)
Anion gap: 8 (ref 5–15)
BUN: 25 mg/dL — ABNORMAL HIGH (ref 6–20)
CO2: 25 mmol/L (ref 22–32)
Calcium: 9.7 mg/dL (ref 8.9–10.3)
Chloride: 109 mmol/L (ref 101–111)
Creatinine, Ser: 1.32 mg/dL — ABNORMAL HIGH (ref 0.44–1.00)
GFR calc Af Amer: 49 mL/min — ABNORMAL LOW (ref >60)
GFR calc non Af Amer: 42 mL/min — ABNORMAL LOW (ref >60)
Glucose, Bld: 91 mg/dL (ref 65–99)
Potassium: 4.1 mmol/L (ref 3.5–5.1)
Sodium: 142 mmol/L (ref 135–145)
Total Bilirubin: 0.6 mg/dL (ref 0.3–1.2)
Total Protein: 7.8 g/dL (ref 6.5–8.1)

## 2015-12-16 LAB — TSH: TSH: 2.483 u[IU]/mL (ref 0.350–4.500)

## 2015-12-16 LAB — HEMOGLOBIN A1C: Hgb A1c MFr Bld: 5.5 % (ref 4.0–6.0)

## 2015-12-16 LAB — FERRITIN: Ferritin: 42 ng/mL (ref 11–307)

## 2015-12-18 ENCOUNTER — Other Ambulatory Visit: Payer: Self-pay | Admitting: Family Medicine

## 2015-12-18 ENCOUNTER — Telehealth: Payer: Self-pay

## 2015-12-18 MED ORDER — ATORVASTATIN CALCIUM 40 MG PO TABS: 40.0000 mg | ORAL_TABLET | Freq: Every day | ORAL | Status: DC

## 2015-12-18 NOTE — Telephone Encounter (Signed)
-----   Message from Steele Sizer, MD sent at 12/17/2015  9:48 PM EDT ----- hgbA1C is within normal limits HDL has decreased and LDL is slightly higher than before. I know she has changed her diet and has been losing weight, but her cardiovascular risk factor ( using [place] )  in the next 10 years is : 9.5% and we need to consider statin therapy  Normal iron storage but she still has mild drop in hgb- and may be anemia of chronic disease.  Her GFR has dropped slightly since last checked but CKI still stage III Normal TSH

## 2016-02-23 ENCOUNTER — Telehealth: Payer: Self-pay | Admitting: Cardiology

## 2016-02-23 NOTE — Telephone Encounter (Signed)
lmov for patient to call back and schedule an appointment for a fu from echo per ckout 11/29/15

## 2016-03-07 ENCOUNTER — Other Ambulatory Visit: Payer: Self-pay | Admitting: Family Medicine

## 2016-03-07 NOTE — Telephone Encounter (Signed)
Patient requesting refill. 

## 2016-05-07 ENCOUNTER — Encounter: Payer: Self-pay | Admitting: Family Medicine

## 2016-05-07 ENCOUNTER — Ambulatory Visit (INDEPENDENT_AMBULATORY_CARE_PROVIDER_SITE_OTHER): Payer: Federal, State, Local not specified - PPO | Admitting: Family Medicine

## 2016-05-07 VITALS — BP 132/82 | HR 125 | Temp 98.0°F | Resp 18 | Ht 72.0 in | Wt 205.1 lb

## 2016-05-07 DIAGNOSIS — M5412 Radiculopathy, cervical region: Secondary | ICD-10-CM | POA: Diagnosis not present

## 2016-05-07 DIAGNOSIS — M4802 Spinal stenosis, cervical region: Secondary | ICD-10-CM

## 2016-05-07 MED ORDER — CYCLOBENZAPRINE HCL 5 MG PO TABS: 5.0000 mg | ORAL_TABLET | Freq: Three times a day (TID) | ORAL | 0 refills | Status: DC | PRN

## 2016-05-07 MED ORDER — PREDNISONE 10 MG (48) PO TBPK: ORAL_TABLET | Freq: Every day | ORAL | 0 refills | Status: DC

## 2016-05-07 NOTE — Progress Notes (Signed)
Name: Melissa [name]   MRN: [id number]    DOB: 06-Aug-1951   Date:05/07/2016       Progress Note  Subjective  Chief Complaint  Chief Complaint  Patient presents with  . Neck Pain    pt thinks she may have aggrivated a pinched nerve in her neck last Thurs  . Neck Pain    while in [place] she went to UC and was given duexis and lorzone    HPI  Acute on Chronic neck pain with radiculitis: she was playing with her grandaughter and heard a pop when she lift her head against resistance. Initially pain was excruciating, she went to Urgent Care in [place] ( where she was at the time ) 5 days ago and was given nsad's and muscle relaxer. Pain at rest is a 4/10., but can go up to 9/10 when she moves. Pain goes down right shoulder intermittently, no weakness, some burning on right shoulder with abduction. No bowel or bladder incontinence  Patient Active Problem List   Diagnosis Date Noted  . Dyslipidemia 12/08/2015  . Gastroesophageal reflux disease without esophagitis 08/11/2015  . Chronic kidney disease (CKD), stage III (moderate) 07/02/2015  . Primary osteoarthritis of knee 06/13/2015  . Hip bursitis 06/13/2015  . RLS (restless legs syndrome) 06/13/2015  . Depression, major, in remission ([place]) 06/13/2015  . B12 deficiency 06/13/2015  . History of Roux-en-Y gastric bypass 06/13/2015  . History of iron deficiency anemia 06/13/2015  . Vitamin D deficiency 06/13/2015  . Hypertension, benign 06/13/2015  . Allergic rhinitis, seasonal 06/13/2015  . Obesity (BMI 30.0-34.9) 06/13/2015  . Cervical radiculitis 01/04/2014  . Cervical spinal stenosis 01/04/2014  . Cervical osteoarthritis 01/04/2014    Past Surgical History:  Procedure Laterality Date  . BARIATRIC SURGERY    . CESAREAN SECTION     3 or more  . GASTRIC BYPASS    . TUBAL LIGATION      Family History  Problem Relation Age of Onset  . Hypercholesterolemia Mother   . Heart disease Mother   . Hypertension Mother   . Lung cancer  Brother   . Alcohol abuse Father   . Alcohol abuse Brother   . Diabetes Mellitus II Sister     Social History   Social History  . Marital status: Divorced    Spouse name: N/A  . Number of children: N/A  . Years of education: N/A   Occupational History  . Not on file.   Social History Main Topics  . Smoking status: Never Smoker  . Smokeless tobacco: Never Used  . Alcohol use No  . Drug use: No  . Sexual activity: Not Currently   Other Topics Concern  . Not on file   Social History Narrative  . No narrative on file     Current Outpatient Prescriptions:  .  aspirin EC 81 MG tablet, Take 1 tablet (81 mg total) by mouth daily., Disp: 90 tablet, Rfl: 3 .  atorvastatin (LIPITOR) 40 MG tablet, Take 1 tablet (40 mg total) by mouth daily., Disp: 90 tablet, Rfl: 1 .  cyclobenzaprine (FLEXERIL) 5 MG tablet, Take 1 tablet (5 mg total) by mouth 3 (three) times daily as needed for muscle spasms. Only take it at night if driving, Disp: 30 tablet, Rfl: 0 .  ferrous sulfate 325 (65 FE) MG EC tablet, Take 325 mg by mouth 3 (three) times daily with meals., Disp: , Rfl:  .  fluticasone (FLONASE) 50 MCG/ACT nasal spray, Place 2 sprays into  both nostrils as needed. , Disp: , Rfl: 2 .  losartan (COZAAR) 25 MG tablet, Take 1 tablet (25 mg total) by mouth daily., Disp: 90 tablet, Rfl: 1 .  magnesium oxide (MAG-OX) 400 MG tablet, Take 400 mg by mouth daily. Reported on 12/08/2015, Disp: , Rfl:  .  predniSONE (STERAPRED UNI-PAK 48 TAB) 10 MG (48) TBPK tablet, Take by mouth daily. Take as directed, Disp: 48 tablet, Rfl: 0 .  Vitamin D, Ergocalciferol, (DRISDOL) 50000 units CAPS capsule, TAKE 1 CAPSULE (50,000 UNITS TOTAL) BY MOUTH EVERY 7 (SEVEN) DAYS., Disp: 12 capsule, Rfl: 0  Allergies  Allergen Reactions  . Contrast Media [Iodinated Diagnostic Agents]   . Shellfish Allergy     Edema     ROS  Ten systems reviewed and is negative except as mentioned in HPI   Objective  Vitals:    05/07/16 1518  BP: 132/82  Pulse: (!) 125  Resp: 18  Temp: 98 F (36.7 C)  SpO2: 95%  Weight: 205 lb 2 oz (93 kg)  Height: 6' (1.829 m)    Body mass index is 27.82 kg/m.  Physical Exam  Constitutional: Patient appears well-developed and well-nourished. Obese . Mild  Distress - looking uncomfortable, can't move her neck HEENT: head atraumatic, normocephalic, pupils equal and reactive to light,  neck is tiff,  throat within normal limits Cardiovascular: Normal rate, regular rhythm and normal heart sounds.  No murmur heard. No BLE edema. Pulmonary/Chest: Effort normal and breath sounds normal. No respiratory distress. Abdominal: Soft.  There is no tenderness. Psychiatric: Patient has a normal mood and affect. behavior is normal. Judgment and thought content normal.  PHQ2/9: Depression screen [facility] 2/9 05/07/2016 12/08/2015 06/13/2015  Decreased Interest 0 0 0  Down, Depressed, Hopeless 0 0 0  PHQ - 2 Score 0 0 0      Fall Risk: Fall Risk  05/07/2016 12/08/2015 06/13/2015  Falls in the past year? No Yes Yes  Number falls in past yr: - 1 1  Injury with Fall? - No No      Functional Status Survey: Is the patient deaf or have difficulty hearing?: No Does the patient have difficulty seeing, even when wearing glasses/contacts?: No Does the patient have difficulty concentrating, remembering, or making decisions?: No Does the patient have difficulty walking or climbing stairs?: No Does the patient have difficulty dressing or bathing?: No Does the patient have difficulty doing errands alone such as visiting a doctor's office or shopping?: No   Assessment & Plan  1. Cervical spinal stenosis   2. Radiculitis of right cervical region  Discussed possible side effects of steroids, she states she can tolerate steroids even after bariatric surgery  - predniSONE (STERAPRED UNI-PAK 48 TAB) 10 MG (48) TBPK tablet; Take by mouth daily. Take as directed  Dispense: 48 tablet; Refill: 0 -  cyclobenzaprine (FLEXERIL) 5 MG tablet; Take 1 tablet (5 mg total) by mouth 3 (three) times daily as needed for muscle spasms. Only take it at night if driving  Dispense: 30 tablet; Refill: 0 She has an appointment with Dr. Phyllis Ginger in 3 weeks

## 2016-05-21 ENCOUNTER — Encounter: Payer: Self-pay | Admitting: Family Medicine

## 2016-05-28 ENCOUNTER — Other Ambulatory Visit: Payer: Self-pay | Admitting: Family Medicine

## 2016-05-28 NOTE — Telephone Encounter (Signed)
Patient requesting refill of Vitamin D to [facility]. 

## 2016-06-20 ENCOUNTER — Telehealth: Payer: Self-pay | Admitting: Family Medicine

## 2016-06-20 NOTE — Telephone Encounter (Signed)
Wants to kow if youcould call her in or give her a rx for the motion sickness patch. Pharm is [facility] on [address]. Pt is traveling out of the [facility] Dec 1st.

## 2016-06-21 ENCOUNTER — Other Ambulatory Visit: Payer: Self-pay | Admitting: Family Medicine

## 2016-06-21 MED ORDER — SCOPOLAMINE 1 MG/3DAYS TD PT72: 1.0000 | MEDICATED_PATCH | TRANSDERMAL | 0 refills | Status: DC

## 2016-06-21 MED ORDER — SCOPOLAMINE 1 MG/3DAYS TD PT72: 1.0000 | MEDICATED_PATCH | TRANSDERMAL | 12 refills | Status: DC

## 2016-06-21 NOTE — Telephone Encounter (Signed)
done

## 2016-06-24 ENCOUNTER — Other Ambulatory Visit: Payer: Self-pay | Admitting: Family Medicine

## 2016-08-21 ENCOUNTER — Encounter: Payer: Self-pay | Admitting: Family Medicine

## 2016-08-21 ENCOUNTER — Ambulatory Visit (INDEPENDENT_AMBULATORY_CARE_PROVIDER_SITE_OTHER): Payer: Federal, State, Local not specified - PPO | Admitting: Family Medicine

## 2016-08-21 VITALS — BP 116/72 | HR 106 | Temp 97.4°F | Resp 18 | Ht 71.0 in | Wt 204.5 lb

## 2016-08-21 DIAGNOSIS — R05 Cough: Secondary | ICD-10-CM

## 2016-08-21 DIAGNOSIS — Z1211 Encounter for screening for malignant neoplasm of colon: Secondary | ICD-10-CM | POA: Diagnosis not present

## 2016-08-21 DIAGNOSIS — N183 Chronic kidney disease, stage 3 unspecified: Secondary | ICD-10-CM

## 2016-08-21 DIAGNOSIS — Z0001 Encounter for general adult medical examination with abnormal findings: Secondary | ICD-10-CM | POA: Diagnosis not present

## 2016-08-21 DIAGNOSIS — Z1231 Encounter for screening mammogram for malignant neoplasm of breast: Secondary | ICD-10-CM

## 2016-08-21 DIAGNOSIS — E559 Vitamin D deficiency, unspecified: Secondary | ICD-10-CM | POA: Diagnosis not present

## 2016-08-21 DIAGNOSIS — Z9884 Bariatric surgery status: Secondary | ICD-10-CM | POA: Diagnosis not present

## 2016-08-21 DIAGNOSIS — Z124 Encounter for screening for malignant neoplasm of cervix: Secondary | ICD-10-CM

## 2016-08-21 DIAGNOSIS — Z2911 Encounter for prophylactic immunotherapy for respiratory syncytial virus (RSV): Secondary | ICD-10-CM | POA: Diagnosis not present

## 2016-08-21 DIAGNOSIS — R059 Cough, unspecified: Secondary | ICD-10-CM

## 2016-08-21 DIAGNOSIS — Z01419 Encounter for gynecological examination (general) (routine) without abnormal findings: Secondary | ICD-10-CM

## 2016-08-21 DIAGNOSIS — E785 Hyperlipidemia, unspecified: Secondary | ICD-10-CM

## 2016-08-21 DIAGNOSIS — Z23 Encounter for immunization: Secondary | ICD-10-CM | POA: Diagnosis not present

## 2016-08-21 DIAGNOSIS — R7303 Prediabetes: Secondary | ICD-10-CM

## 2016-08-21 DIAGNOSIS — G8929 Other chronic pain: Secondary | ICD-10-CM | POA: Diagnosis not present

## 2016-08-21 DIAGNOSIS — Z1239 Encounter for other screening for malignant neoplasm of breast: Secondary | ICD-10-CM

## 2016-08-21 DIAGNOSIS — Z800 Family history of malignant neoplasm of digestive organs: Secondary | ICD-10-CM | POA: Diagnosis not present

## 2016-08-21 MED ORDER — TRAMADOL HCL 50 MG PO TABS: 50.0000 mg | ORAL_TABLET | Freq: Three times a day (TID) | ORAL | 0 refills | Status: DC | PRN

## 2016-08-21 NOTE — Progress Notes (Signed)
Name: Melissa [name]   MRN: [id number]    DOB: 1951-05-05   Date:08/21/2016       Progress Note  Subjective  Chief Complaint  Chief Complaint  Patient presents with  . Annual Exam    HPI  Well woman: not sexually active for years. No vaginal discharge, no post-menopausal. No breast lumps but she felt a nodule on right axilla , non-tender past few weeks. No bladder issues. She has some hot flashes.   Dry cough: she is concerned because brother died of lung cancer, he was smoker but she never smoked. She states not constant, it is dry, no SOB or wheezing. Only episode of cough in the past was secondary to ACE. She denies any URI symptoms. No fever, chills or change in appetite  CKI: we will check GFR, she took Naproxen back in October because of back problems, and she also takes otc medication that she is not sure of the name. Never seen by Nephrologist. She has pain on knees, neck , back of hips, and is not sure of what to take, advised tylenol and prn Tramadol only  HTN: well controlled, no chest pain or palpitation.    Patient Active Problem List   Diagnosis Date Noted  . Dyslipidemia 12/08/2015  . Gastroesophageal reflux disease without esophagitis 08/11/2015  . Chronic kidney disease (CKD), stage III (moderate) 07/02/2015  . Primary osteoarthritis of knee 06/13/2015  . Hip bursitis 06/13/2015  . RLS (restless legs syndrome) 06/13/2015  . Depression, major, in remission ([place]) 06/13/2015  . B12 deficiency 06/13/2015  . History of Roux-en-Y gastric bypass 06/13/2015  . History of iron deficiency anemia 06/13/2015  . Vitamin D deficiency 06/13/2015  . Hypertension, benign 06/13/2015  . Allergic rhinitis, seasonal 06/13/2015  . Obesity (BMI 30.0-34.9) 06/13/2015  . Cervical radiculitis 01/04/2014  . Cervical spinal stenosis 01/04/2014  . Cervical osteoarthritis 01/04/2014    Past Surgical History:  Procedure Laterality Date  . BARIATRIC SURGERY    . CESAREAN SECTION     3 or more  . GASTRIC BYPASS    . TUBAL LIGATION      Family History  Problem Relation Age of Onset  . Hypercholesterolemia Mother   . Heart disease Mother   . Hypertension Mother   . Alcohol abuse Father   . Lung cancer Brother   . Alcohol abuse Brother   . Diabetes Mellitus II Sister     Social History   Social History  . Marital status: Divorced    Spouse name: N/A  . Number of children: N/A  . Years of education: N/A   Occupational History  . Not on file.   Social History Main Topics  . Smoking status: Never Smoker  . Smokeless tobacco: Never Used  . Alcohol use No  . Drug use: No  . Sexual activity: Not Currently   Other Topics Concern  . Not on file   Social History Narrative  . No narrative on file     Current Outpatient Prescriptions:  .  aspirin EC 81 MG tablet, Take 1 tablet (81 mg total) by mouth daily., Disp: 90 tablet, Rfl: 3 .  atorvastatin (LIPITOR) 40 MG tablet, TAKE 1 TABLET (40 MG TOTAL) BY MOUTH DAILY., Disp: 90 tablet, Rfl: 1 .  Cholecalciferol (VITAMIN D3 ADULT GUMMIES PO), Take 1 each by mouth daily., Disp: , Rfl:  .  ferrous sulfate 325 (65 FE) MG EC tablet, Take 325 mg by mouth 2 (two) times daily at 8 am  and 10 pm. , Disp: , Rfl:  .  fluticasone (FLONASE) 50 MCG/ACT nasal spray, Place 2 sprays into both nostrils as needed. , Disp: , Rfl: 2 .  losartan (COZAAR) 25 MG tablet, Take 1 tablet (25 mg total) by mouth daily., Disp: 90 tablet, Rfl: 1 .  magnesium oxide (MAG-OX) 400 MG tablet, Take 400 mg by mouth daily. Reported on 12/08/2015, Disp: , Rfl:  .  naproxen (NAPROSYN) 500 MG tablet, Take 500 mg by mouth 2 (two) times daily as needed. for pain, Disp: , Rfl: 0 .  scopolamine (TRANSDERM-SCOP) 1 MG/3DAYS, Place 1 patch (1.5 mg total) onto the skin every 3 (three) days. (Patient not taking: Reported on 08/21/2016), Disp: 3 patch, Rfl: 0 .  Vitamin D, Ergocalciferol, (DRISDOL) 50000 units CAPS capsule, TAKE 1 CAPSULE (50,000 UNITS TOTAL) BY MOUTH  EVERY 7 (SEVEN) DAYS. (Patient not taking: Reported on 08/21/2016), Disp: 12 capsule, Rfl: 0  Allergies  Allergen Reactions  . Contrast Media [Iodinated Diagnostic Agents]   . Shellfish Allergy     Edema     ROS  Constitutional: Negative for fever or significant weight change.  Respiratory: Positive for cough no shortness of breath.   Cardiovascular: Negative for chest pain or palpitations.  Gastrointestinal: Negative for abdominal pain, no bowel changes.  Musculoskeletal: Negative for gait problem or joint swelling.  Skin: Negative for rash.  Neurological: Negative for dizziness or headache.  No other specific complaints in a complete review of systems (except as listed in HPI above).  Objective  Vitals:   08/21/16 1359  BP: 116/72  Pulse: (!) 106  Resp: 18  Temp: 97.4 F (36.3 C)  TempSrc: Oral  SpO2: 96%  Weight: 204 lb 8 oz (92.8 kg)  Height: 5\' 11"  (1.803 m)    Body mass index is 28.52 kg/m.  Physical Exam  Constitutional: Patient appears well-developed  No distress.  HENT: Head: Normocephalic and atraumatic. Ears: B TMs ok, no erythema or effusion; Nose: Nose normal. Mouth/Throat: Oropharynx is clear and moist. No oropharyngeal exudate.  Eyes: Conjunctivae and EOM are normal. Pupils are equal, round, and reactive to light. No scleral icterus.  Neck: Normal range of motion. Neck supple. No JVD present. No thyromegaly present.  Cardiovascular: Normal rate, regular rhythm and normal heart sounds.  No murmur heard. No BLE edema. Pulmonary/Chest: Effort normal and breath sounds normal. No respiratory distress. Abdominal: Soft. Bowel sounds are normal, no distension. There is no tenderness. no masses Breast: no lumps or masses, no nipple discharge or rashes FEMALE GENITALIA:  External genitalia normal External urethra normal Vaginal vault normal without discharge or lesions Cervix normal without discharge or lesions Bimanual exam normal without masses RECTAL: not  done Musculoskeletal: Normal range of motion, no joint effusions. No gross deformities Neurological: he is alert and oriented to person, place, and time. No cranial nerve deficit. Coordination, balance, strength, speech and gait are normal.  Skin: Skin is warm and dry. No rash noted. No erythema.  Psychiatric: Patient has a normal mood and affect. behavior is normal. Judgment and thought content normal.  PHQ2/9: Depression screen [facility] 2/9 08/21/2016 05/07/2016 12/08/2015 06/13/2015  Decreased Interest 0 0 0 0  Down, Depressed, Hopeless 0 0 0 0  PHQ - 2 Score 0 0 0 0    Fall Risk: Fall Risk  08/21/2016 05/07/2016 12/08/2015 06/13/2015  Falls in the past year? No No Yes Yes  Number falls in past yr: - - 1 1  Injury with Fall? - - No  No    Functional Status Survey: Is the patient deaf or have difficulty hearing?: No Does the patient have difficulty seeing, even when wearing glasses/contacts?: No Does the patient have difficulty concentrating, remembering, or making decisions?: No Does the patient have difficulty walking or climbing stairs?: No Does the patient have difficulty dressing or bathing?: No Does the patient have difficulty doing errands alone such as visiting a doctor's office or shopping?: No    Assessment & Plan  1. Well woman exam  Discussed importance of 150 minutes of physical activity weekly, eat two servings of fish weekly, eat one serving of tree nuts ( cashews, pistachios, pecans, almonds.Marland Kitchen) every other day, eat 6 servings of fruit/vegetables daily and drink plenty of water and avoid sweet beverages.   2. Needs flu shot  - Flu Vaccine QUAD 36+ mos IM  3. Need for shingles vaccine  - Varicella-zoster vaccine subcutaneous  4. Cervical cancer screening  - PapLb, HPV, rfx16/18  5. Breast cancer screening  - MM Digital Screening  6. Encounter for screening colonoscopy  - Ambulatory referral to Gastroenterology  7. Family history of colon cancer  -  Ambulatory referral to Gastroenterology  8. History of Roux-en-Y gastric bypass  - COMPLETE METABOLIC PANEL WITH GFR - Vitamin B12  9. Vitamin D deficiency  - VITAMIN D 25 Hydroxy (Vit-D Deficiency, Fractures)  10. Prediabetes  - COMPLETE METABOLIC PANEL WITH GFR - Hemoglobin A1c - Insulin, fasting  11. Chronic kidney disease (CKD), stage III (moderate)  - COMPLETE METABOLIC PANEL WITH GFR - CBC with Differential/Platelet  12. Dyslipidemia  - Lipid panel  13. Cough  - DG Chest 2 View; Future

## 2016-08-22 ENCOUNTER — Telehealth: Payer: Self-pay | Admitting: General Surgery

## 2016-08-22 NOTE — Telephone Encounter (Signed)
L/M FOR PT TO RETURN CALL TO SCHEDULE AN APPOINTMENT WITH DR BYRNETT.REF DR Ancil Boozer SCREENING COLONOSCOPY(?ANY PRIOR)FED [employer]/MTH

## 2016-08-28 LAB — PAPIG, HPV, RFX 16/18
HPV, high-risk: NEGATIVE
PAP Smear Comment: 0

## 2016-08-28 NOTE — Telephone Encounter (Signed)
08-28-16 L/M FOR PT TO CALL & SCHEDULE AN APPOINTMENT WITH DR BYRNETT(SCREENING COLONOSCOPY/FX HX COLON CA)REF DR Lenard Simmer

## 2016-09-02 ENCOUNTER — Telehealth: Payer: Self-pay | Admitting: Cardiology

## 2016-09-02 NOTE — Telephone Encounter (Signed)
3 attempts to schedule fu from recall.  Deleting recall.  °

## 2016-09-03 ENCOUNTER — Encounter: Payer: Self-pay | Admitting: *Deleted

## 2016-09-24 ENCOUNTER — Encounter: Payer: Self-pay | Admitting: General Surgery

## 2016-09-24 ENCOUNTER — Ambulatory Visit (INDEPENDENT_AMBULATORY_CARE_PROVIDER_SITE_OTHER): Payer: Federal, State, Local not specified - PPO | Admitting: General Surgery

## 2016-09-24 VITALS — BP 120/80 | HR 74 | Resp 12 | Ht 71.0 in | Wt 201.0 lb

## 2016-09-24 DIAGNOSIS — Z86010 Personal history of colonic polyps: Secondary | ICD-10-CM

## 2016-09-24 NOTE — Patient Instructions (Signed)
Colonoscopy, Adult A colonoscopy is an exam to look at the entire large intestine. During the exam, a lubricated, bendable tube is inserted into the anus and then passed into the rectum, colon, and other parts of the large intestine. A colonoscopy is often done as a part of normal colorectal screening or in response to certain symptoms, such as anemia, persistent diarrhea, abdominal pain, and blood in the stool. The exam can help screen for and diagnose medical problems, including:  Tumors.  Polyps.  Inflammation.  Areas of bleeding. Tell a health care provider about:  Any allergies you have.  All medicines you are taking, including vitamins, herbs, eye drops, creams, and over-the-counter medicines.  Any problems you or family members have had with anesthetic medicines.  Any blood disorders you have.  Any surgeries you have had.  Any medical conditions you have.  Any problems you have had passing stool. What are the risks? Generally, this is a safe procedure. However, problems may occur, including:  Bleeding.  A tear in the intestine.  A reaction to medicines given during the exam.  Infection (rare). What happens before the procedure? Eating and drinking restrictions  Follow instructions from your health care provider about eating and drinking, which may include:  A few days before the procedure - follow a low-fiber diet. Avoid nuts, seeds, dried fruit, raw fruits, and vegetables.  1-3 days before the procedure - follow a clear liquid diet. Drink only clear liquids, such as clear broth or bouillon, black coffee or tea, clear juice, clear soft drinks or sports drinks, gelatin desert, and popsicles. Avoid any liquids that contain red or purple dye.  On the day of the procedure - do not eat or drink anything during the 2 hours before the procedure, or within the time period that your health care provider recommends. Bowel prep  If you were prescribed an oral bowel prep to  clean out your colon:  Take it as told by your health care provider. Starting the day before your procedure, you will need to drink a large amount of medicated liquid. The liquid will cause you to have multiple loose stools until your stool is almost clear or light green.  If your skin or anus gets irritated from diarrhea, you may use these to relieve the irritation:  Medicated wipes, such as adult wet wipes with aloe and vitamin E.  A skin soothing-product like petroleum jelly.  If you vomit while drinking the bowel prep, take a break for up to 60 minutes and then begin the bowel prep again. If vomiting continues and you cannot take the bowel prep without vomiting, call your health care provider. General instructions  Ask your health care provider about changing or stopping your regular medicines. This is especially important if you are taking diabetes medicines or blood thinners.  Plan to have someone take you home from the hospital or clinic. What happens during the procedure?  An IV tube may be inserted into one of your veins.  You will be given medicine to help you relax (sedative).  To reduce your risk of infection:  Your health care team will wash or sanitize their hands.  Your anal area will be washed with soap.  You will be asked to lie on your side with your knees bent.  Your health care provider will lubricate a long, thin, flexible tube. The tube will have a camera and a light on the end.  The tube will be inserted into your anus.  The tube will be gently eased through your rectum and colon.  Air will be delivered into your colon to keep it open. You may feel some pressure or cramping.  The camera will be used to take images during the procedure.  A small tissue sample may be removed from your body to be examined under a microscope (biopsy). If any potential problems are found, the tissue will be sent to a lab for testing.  If small polyps are found, your health  care provider may remove them and have them checked for cancer cells.  The tube that was inserted into your anus will be slowly removed. The procedure may vary among health care providers and hospitals. What happens after the procedure?  Your blood pressure, heart rate, breathing rate, and blood oxygen level will be monitored until the medicines you were given have worn off.  Do not drive for 24 hours after the exam.  You may have a small amount of blood in your stool.  You may pass gas and have mild abdominal cramping or bloating due to the air that was used to inflate your colon during the exam.  It is up to you to get the results of your procedure. Ask your health care provider, or the department performing the procedure, when your results will be ready. This information is not intended to replace advice given to you by your health care provider. Make sure you discuss any questions you have with your health care provider. Document Released: 08/02/2000 Document Revised: 02/23/2016 Document Reviewed: 10/17/2015 Elsevier Interactive Patient Education  2017 [employer]  

## 2016-09-24 NOTE — Progress Notes (Signed)
Patient ID: Melissa [name], female   DOB: July 21, 1951, 66 y.o.   MRN: [id number]  Chief Complaint  Patient presents with  . Colonoscopy    HPI Melissa [name] is a 66 y.o. female here today for a evaluation of a colonoscopy. Last on e was done in7/16/2015 by Dr. Allen Norris. No GI problems at this time. Patient sister has colon cancer.  HPI  Past Medical History:  Diagnosis Date  . Bruising   . Cervical radiculopathy   . Deficiency of vitamin B   . Depression   . Edema   . Flank pain   . Gross hematuria   . HTN (hypertension)   . Iron deficiency anemia   . Leukopenia   . OA (osteoarthritis)   . Obesity   . Osteopenia   . Perennial allergic rhinitis   . Renal cysts, acquired, bilateral   . RLS (restless legs syndrome)   . Sickle cell trait ([place])   . Snoring   . Spinal stenosis     Past Surgical History:  Procedure Laterality Date  . BARIATRIC SURGERY    . CESAREAN SECTION     3 or more  . GASTRIC BYPASS    . TUBAL LIGATION      Family History  Problem Relation Age of Onset  . Hypercholesterolemia Mother   . Heart disease Mother   . Hypertension Mother   . Alcohol abuse Father   . Lung cancer Brother   . Alcohol abuse Brother   . Diabetes Mellitus II Sister   . Colon cancer Sister     Social History Social History  Substance Use Topics  . Smoking status: Never Smoker  . Smokeless tobacco: Never Used  . Alcohol use No    Allergies  Allergen Reactions  . Contrast Media [Iodinated Diagnostic Agents]   . Shellfish Allergy     Edema    Current Outpatient Prescriptions  Medication Sig Dispense Refill  . aspirin EC 81 MG tablet Take 1 tablet (81 mg total) by mouth daily. 90 tablet 3  . atorvastatin (LIPITOR) 40 MG tablet TAKE 1 TABLET (40 MG TOTAL) BY MOUTH DAILY. 90 tablet 1  . Cholecalciferol (VITAMIN D3 ADULT GUMMIES PO) Take 1 each by mouth daily.    . ferrous sulfate 325 (65 FE) MG EC tablet Take 325 mg by mouth 2 (two) times daily at 8 am and 10  pm.     . fluticasone (FLONASE) 50 MCG/ACT nasal spray Place 2 sprays into both nostrils as needed.   2  . losartan (COZAAR) 25 MG tablet Take 1 tablet (25 mg total) by mouth daily. 90 tablet 1  . magnesium oxide (MAG-OX) 400 MG tablet Take 400 mg by mouth daily. Reported on 12/08/2015    . traMADol (ULTRAM) 50 MG tablet Take 1 tablet (50 mg total) by mouth every 8 (eight) hours as needed. 30 tablet 0  . Vitamin D, Ergocalciferol, (DRISDOL) 50000 units CAPS capsule TAKE 1 CAPSULE (50,000 UNITS TOTAL) BY MOUTH EVERY 7 (SEVEN) DAYS. (Patient not taking: Reported on 08/21/2016) 12 capsule 0   No current facility-administered medications for this visit.     Review of Systems Review of Systems  Constitutional: Negative.   Eyes: Negative.   Cardiovascular: Negative.   Gastrointestinal: Positive for constipation.    Blood pressure 120/80, pulse 74, resp. rate 12, height 5\' 11"  (1.803 m), weight 201 lb (91.2 kg).  Physical Exam Physical Exam  Constitutional: She is oriented to person, place, and time.  She appears well-developed and well-nourished.  Cardiovascular: Normal rate, regular rhythm and normal heart sounds.   Pulmonary/Chest: Effort normal and breath sounds normal.  Neurological: She is alert and oriented to person, place, and time.  Skin: Skin is warm and dry.    Data Reviewed 03/03/2014 colonoscopy showed 2 individual polyps were identified in the transverse colon. One was identified as a tubular adenoma without evidence of high-grade dysplasia.    Assessment    Previous tubular adenoma of the transverse colon without dysplasia.    Plan    The patient's normal screening interval will be every 5 years even in light of her sister's recent diagnosis with invasive cancer.    Patient to return in 2 years for colonoscopy.   This information has been scribed by Gaspar Cola CMA.    Melissa [name] 09/25/2016, 6:36 PM

## 2016-09-25 DIAGNOSIS — Z86010 Personal history of colonic polyps: Secondary | ICD-10-CM | POA: Insufficient documentation

## 2016-09-30 ENCOUNTER — Other Ambulatory Visit: Payer: Self-pay | Admitting: Family Medicine

## 2016-09-30 DIAGNOSIS — I10 Essential (primary) hypertension: Secondary | ICD-10-CM

## 2016-09-30 NOTE — Telephone Encounter (Signed)
Patient requesting refill of Losartan to [facility].

## 2016-10-16 ENCOUNTER — Encounter: Payer: Self-pay | Admitting: Family Medicine

## 2016-10-16 ENCOUNTER — Ambulatory Visit (INDEPENDENT_AMBULATORY_CARE_PROVIDER_SITE_OTHER): Payer: Federal, State, Local not specified - PPO | Admitting: Family Medicine

## 2016-10-16 VITALS — BP 118/78 | HR 108 | Temp 98.7°F | Resp 16 | Ht 71.0 in | Wt 202.2 lb

## 2016-10-16 DIAGNOSIS — R109 Unspecified abdominal pain: Secondary | ICD-10-CM

## 2016-10-16 DIAGNOSIS — R319 Hematuria, unspecified: Secondary | ICD-10-CM | POA: Diagnosis not present

## 2016-10-16 DIAGNOSIS — I700 Atherosclerosis of aorta: Secondary | ICD-10-CM | POA: Insufficient documentation

## 2016-10-16 DIAGNOSIS — N200 Calculus of kidney: Secondary | ICD-10-CM | POA: Diagnosis not present

## 2016-10-16 LAB — POCT URINALYSIS DIPSTICK
Bilirubin, UA: NEGATIVE
Glucose, UA: NEGATIVE
Ketones, UA: NEGATIVE
Nitrite, UA: NEGATIVE
Spec Grav, UA: 1.025
Urobilinogen, UA: NEGATIVE
pH, UA: 6.5

## 2016-10-16 MED ORDER — CIPROFLOXACIN HCL 250 MG PO TABS: 250.0000 mg | ORAL_TABLET | Freq: Two times a day (BID) | ORAL | 0 refills | Status: DC

## 2016-10-16 NOTE — Progress Notes (Addendum)
Name: Melissa [name]   MRN: [id number]    DOB: Dec 15, 1950   Date:10/16/2016       Progress Note  Subjective  Chief Complaint  Chief Complaint  Patient presents with  . Urinary Tract Infection    4-5 days ago   . Abdominal Pain    pt stated that she was recently sexually active  . Hematuria    HPI  Flank pain: she has a history of nephrolithiasis, seen by Urologist in 2015 and had a CT scan that showed multiple stones on left side and one on the right side. She also had aortic atherosclerosis and is on statin and aspirin therapy. She was fine until 5 days . She noticed pink urine in the beginning without pain, followed by right lower back pain, right groin pain, and urinary frequency. Urine is getting more red. She has chills, no nausea or vomiting. She has a lack of appetite. Pain level is better today than last night, at this time is 5/10 . Last night it was 9/10. Pain is described as dull like but not sure how it was last night. She was on a cruise last week , and has a new sexual partner.    Patient Active Problem List   Diagnosis Date Noted  . Atherosclerosis of aorta ([place]) 10/16/2016  . History of colonic polyps 09/25/2016  . Dyslipidemia 12/08/2015  . Gastroesophageal reflux disease without esophagitis 08/11/2015  . Chronic kidney disease (CKD), stage III (moderate) 07/02/2015  . Primary osteoarthritis of knee 06/13/2015  . Hip bursitis 06/13/2015  . RLS (restless legs syndrome) 06/13/2015  . Depression, major, in remission ([place]) 06/13/2015  . B12 deficiency 06/13/2015  . History of Roux-en-Y gastric bypass 06/13/2015  . History of iron deficiency anemia 06/13/2015  . Vitamin D deficiency 06/13/2015  . Hypertension, benign 06/13/2015  . Allergic rhinitis, seasonal 06/13/2015  . Obesity (BMI 30.0-34.9) 06/13/2015  . Cervical radiculitis 01/04/2014  . Cervical spinal stenosis 01/04/2014  . Cervical osteoarthritis 01/04/2014    Past Surgical History:  Procedure  Laterality Date  . BARIATRIC SURGERY    . CESAREAN SECTION     3 or more  . GASTRIC BYPASS    . TUBAL LIGATION      Family History  Problem Relation Age of Onset  . Hypercholesterolemia Mother   . Heart disease Mother   . Hypertension Mother   . Alcohol abuse Father   . Lung cancer Brother   . Alcohol abuse Brother   . Diabetes Mellitus II Sister   . Colon cancer Sister     Social History   Social History  . Marital status: Divorced    Spouse name: N/A  . Number of children: N/A  . Years of education: N/A   Occupational History  . Not on file.   Social History Main Topics  . Smoking status: Never Smoker  . Smokeless tobacco: Never Used  . Alcohol use No  . Drug use: No  . Sexual activity: Not Currently   Other Topics Concern  . Not on file   Social History Narrative  . No narrative on file     Current Outpatient Prescriptions:  .  aspirin EC 81 MG tablet, Take 1 tablet (81 mg total) by mouth daily., Disp: 90 tablet, Rfl: 3 .  atorvastatin (LIPITOR) 40 MG tablet, TAKE 1 TABLET (40 MG TOTAL) BY MOUTH DAILY., Disp: 90 tablet, Rfl: 1 .  Cholecalciferol (VITAMIN D3 ADULT GUMMIES PO), Take 1 each by mouth daily.,  Disp: , Rfl:  .  ciprofloxacin (CIPRO) 250 MG tablet, Take 1 tablet (250 mg total) by mouth 2 (two) times daily., Disp: 14 tablet, Rfl: 0 .  ferrous sulfate 325 (65 FE) MG EC tablet, Take 325 mg by mouth 2 (two) times daily at 8 am and 10 pm. , Disp: , Rfl:  .  fluticasone (FLONASE) 50 MCG/ACT nasal spray, Place 2 sprays into both nostrils as needed. , Disp: , Rfl: 2 .  losartan (COZAAR) 25 MG tablet, TAKE 1 TABLET (25 MG TOTAL) BY MOUTH DAILY., Disp: 90 tablet, Rfl: 1 .  magnesium oxide (MAG-OX) 400 MG tablet, Take 400 mg by mouth daily. Reported on 12/08/2015, Disp: , Rfl:  .  traMADol (ULTRAM) 50 MG tablet, Take 1 tablet (50 mg total) by mouth every 8 (eight) hours as needed., Disp: 30 tablet, Rfl: 0 .  Vitamin D, Ergocalciferol, (DRISDOL) 50000 units CAPS  capsule, TAKE 1 CAPSULE (50,000 UNITS TOTAL) BY MOUTH EVERY 7 (SEVEN) DAYS. (Patient not taking: Reported on 08/21/2016), Disp: 12 capsule, Rfl: 0  Allergies  Allergen Reactions  . Contrast Media [Iodinated Diagnostic Agents]   . Shellfish Allergy     Edema     ROS  Ten systems reviewed and is negative except as mentioned in HPI   Objective  Vitals:   10/16/16 1131  BP: 118/78  Pulse: (!) 108  Resp: 16  Temp: 98.7 F (37.1 C)  SpO2: 97%  Weight: 202 lb 4 oz (91.7 kg)  Height: 5\' 11"  (1.803 m)    Body mass index is 28.21 kg/m.  Physical Exam  Constitutional: Patient appears well-developed and well-nourished. Obese  No distress.  HEENT: head atraumatic, normocephalic, pupils equal and reactive to light,  neck supple, throat within normal limits Cardiovascular: Normal rate, regular rhythm and normal heart sounds.  No murmur heard. No BLE edema. Pulmonary/Chest: Effort normal and breath sounds normal. No respiratory distress. Abdominal: Soft.  There is positive CVA tenderness on right side Psychiatric: Patient has a normal mood and affect. behavior is normal. Judgment and thought content normal.  Recent Results (from the past 2160 hour(s))  PapIG, HPV, rfx 16/18     Status: None   Collection Time: 08/22/16 12:00 AM  Result Value Ref Range   DIAGNOSIS: Comment     Comment: NEGATIVE FOR INTRAEPITHELIAL LESION AND MALIGNANCY.   Specimen adequacy: Comment     Comment: Satisfactory for evaluation. Endocervical and/or squamous metaplastic cells (endocervical component) are present.    CLINICIAN PROVIDED ICD10: Comment     Comment: Z98.84 R73.03 N18.3 E78.5 E55.9    Performed by: Comment     Comment: Kaylyn Lim, Cytotechnologist (ASCP)   PAP SMEAR COMMENT .    Note: Comment     Comment: The Pap smear is a screening test designed to aid in the detection of premalignant and malignant conditions of the uterine cervix.  It is not a diagnostic procedure and should not be  used as the sole means of detecting cervical cancer.  Both false-positive and false-negative reports do occur.    Test Methodology Comment     Comment: This liquid based ThinPrep(R) pap test was screened with the use of an image guided system.    HPV, high-risk Negative Negative    Comment: This high-risk HPV test detects thirteen high-risk types (16/18/31/33/35/39/45/51/52/56/58/59/68) without differentiation.   POCT urinalysis dipstick     Status: Abnormal   Collection Time: 10/16/16 11:37 AM  Result Value Ref Range   Color, UA dark  Clarity, UA cloudy    Glucose, UA neg    Bilirubin, UA neg    Ketones, UA neg    Spec Grav, UA 1.025    Blood, UA large    pH, UA 6.5    Protein, UA trace    Urobilinogen, UA negative    Nitrite, UA neg    Leukocytes, UA 4+ (A) Negative     PHQ2/9: Depression screen [facility] 2/9 08/21/2016 05/07/2016 12/08/2015 06/13/2015  Decreased Interest 0 0 0 0  Down, Depressed, Hopeless 0 0 0 0  PHQ - 2 Score 0 0 0 0     Fall Risk: Fall Risk  08/21/2016 05/07/2016 12/08/2015 06/13/2015  Falls in the past year? No No Yes Yes  Number falls in past yr: - - 1 1  Injury with Fall? - - No No     Assessment & Plan  1. Hematuria, unspecified type  - POCT urinalysis dipstick - Urine culture - GC/Chlamydia Probe Amp - CT renal stone  Future - ciprofloxacin (CIPRO) 250 MG tablet; Take 1 tablet (250 mg total) by mouth 2 (two) times daily.  Dispense: 14 tablet; Refill: 0 Try tramadol for pain ( she has at home    2. Atherosclerosis of aorta (HCC)  Continue statin and aspirin   3. Bilateral nephrolithiasis  - Urine culture - GC/Chlamydia Probe Amp - CT Abdomen Pelvis Wo Contrast; Future  4. Acute right flank pain  - CT Abdomen Pelvis Wo Contrast; Future

## 2016-10-16 NOTE — Addendum Note (Signed)
Addended by: Steele Sizer F on: 10/16/2016 12:07 PM   Modules accepted: Orders

## 2016-10-16 NOTE — Addendum Note (Signed)
Addended by: Saunders Glance A on: 10/16/2016 12:08 PM   Modules accepted: Orders

## 2016-10-17 ENCOUNTER — Other Ambulatory Visit: Payer: Self-pay | Admitting: Family Medicine

## 2016-10-17 LAB — CBC WITH DIFFERENTIAL/PLATELET
Basophils Absolute: 0 cells/uL (ref 0–200)
Basophils Relative: 0 %
Eosinophils Absolute: 91 cells/uL (ref 15–500)
Eosinophils Relative: 1 %
HCT: 33.4 % — ABNORMAL LOW (ref 35.0–45.0)
Hemoglobin: 11 g/dL — ABNORMAL LOW (ref 11.7–15.5)
Lymphocytes Relative: 13 %
Lymphs Abs: 1183 cells/uL (ref 850–3900)
MCH: 29.5 pg (ref 27.0–33.0)
MCHC: 32.9 g/dL (ref 32.0–36.0)
MCV: 89.5 fL (ref 80.0–100.0)
MPV: 10.4 fL (ref 7.5–12.5)
Monocytes Absolute: 1183 cells/uL — ABNORMAL HIGH (ref 200–950)
Monocytes Relative: 13 %
Neutro Abs: 6643 cells/uL (ref 1500–7800)
Neutrophils Relative %: 73 %
Platelets: 200 10*3/uL (ref 140–400)
RBC: 3.73 MIL/uL — ABNORMAL LOW (ref 3.80–5.10)
RDW: 13.3 % (ref 11.0–15.0)
WBC: 9.1 10*3/uL (ref 3.8–10.8)

## 2016-10-17 LAB — GC/CHLAMYDIA PROBE AMP
CT Probe RNA: NOT DETECTED
GC Probe RNA: NOT DETECTED

## 2016-10-17 LAB — HEMOGLOBIN A1C
Hgb A1c MFr Bld: 5.4 % (ref <5.7)
Mean Plasma Glucose: 108 mg/dL

## 2016-10-18 ENCOUNTER — Ambulatory Visit
Admission: RE | Admit: 2016-10-18 | Discharge: 2016-10-18 | Disposition: A | Discharge status: Home / Self Care | Payer: Federal, State, Local not specified - PPO | Source: Ambulatory Visit | Attending: Family Medicine | Admitting: Family Medicine

## 2016-10-18 ENCOUNTER — Encounter: Payer: Self-pay | Admitting: Family Medicine

## 2016-10-18 DIAGNOSIS — N200 Calculus of kidney: Secondary | ICD-10-CM | POA: Diagnosis present

## 2016-10-18 DIAGNOSIS — I700 Atherosclerosis of aorta: Secondary | ICD-10-CM | POA: Insufficient documentation

## 2016-10-18 DIAGNOSIS — R319 Hematuria, unspecified: Secondary | ICD-10-CM

## 2016-10-18 DIAGNOSIS — Z9884 Bariatric surgery status: Secondary | ICD-10-CM | POA: Diagnosis not present

## 2016-10-18 DIAGNOSIS — I313 Pericardial effusion (noninflammatory): Secondary | ICD-10-CM | POA: Diagnosis not present

## 2016-10-18 DIAGNOSIS — R109 Unspecified abdominal pain: Secondary | ICD-10-CM

## 2016-10-18 DIAGNOSIS — N281 Cyst of kidney, acquired: Secondary | ICD-10-CM | POA: Insufficient documentation

## 2016-10-18 LAB — COMPLETE METABOLIC PANEL WITH GFR
ALT: 10 U/L (ref 6–29)
AST: 15 U/L (ref 10–35)
Albumin: 3.4 g/dL — ABNORMAL LOW (ref 3.6–5.1)
Alkaline Phosphatase: 56 U/L (ref 33–130)
BUN: 22 mg/dL (ref 7–25)
CO2: 22 mmol/L (ref 20–31)
Calcium: 8.6 mg/dL (ref 8.6–10.4)
Chloride: 105 mmol/L (ref 98–110)
Creat: 1.48 mg/dL — ABNORMAL HIGH (ref 0.50–0.99)
GFR, Est African American: 43 mL/min — ABNORMAL LOW (ref >=60)
GFR, Est Non African American: 37 mL/min — ABNORMAL LOW (ref >=60)
Glucose, Bld: 109 mg/dL — ABNORMAL HIGH (ref 65–99)
Potassium: 4.7 mmol/L (ref 3.5–5.3)
Sodium: 139 mmol/L (ref 135–146)
Total Bilirubin: 0.7 mg/dL (ref 0.2–1.2)
Total Protein: 6.2 g/dL (ref 6.1–8.1)

## 2016-10-18 LAB — LIPID PANEL
Cholesterol: 136 mg/dL (ref <200)
HDL: 80 mg/dL (ref >50)
LDL Cholesterol: 39 mg/dL (ref <100)
Total CHOL/HDL Ratio: 1.7 Ratio (ref <5.0)
Triglycerides: 85 mg/dL (ref <150)
VLDL: 17 mg/dL (ref <30)

## 2016-10-18 LAB — VITAMIN B12: Vitamin B-12: 560 pg/mL (ref 200–1100)

## 2016-10-18 LAB — VITAMIN D 25 HYDROXY (VIT D DEFICIENCY, FRACTURES): Vit D, 25-Hydroxy: 34 ng/mL (ref 30–100)

## 2016-10-18 LAB — INSULIN, FASTING: Insulin fasting, serum: 8.3 u[IU]/mL (ref 2.0–19.6)

## 2016-10-18 LAB — URINE CULTURE

## 2016-10-21 ENCOUNTER — Telehealth: Payer: Self-pay

## 2016-10-21 ENCOUNTER — Other Ambulatory Visit: Payer: Self-pay | Admitting: Family Medicine

## 2016-10-21 DIAGNOSIS — D649 Anemia, unspecified: Secondary | ICD-10-CM

## 2016-10-21 NOTE — Telephone Encounter (Signed)
Patient called and stated she received Dr. Ancil Boozer voicemail about her imaging but has questions. Could Dr. Ancil Boozer please call her again. Patient states she has seen Zara Council and she had tons of kidney cyst then. Also if her kidneys stones are stable and she has already had the cyst, why does she need to go back to Urology?

## 2016-10-22 ENCOUNTER — Other Ambulatory Visit: Payer: Self-pay

## 2016-10-22 DIAGNOSIS — D649 Anemia, unspecified: Secondary | ICD-10-CM

## 2016-10-22 NOTE — Telephone Encounter (Signed)
Patient would like to proceed with the imaging first before a referral for Urology. Since she feels like there was nothing that could be done with them on her last appointment with Urology.

## 2016-10-22 NOTE — Telephone Encounter (Signed)
LMOM . Explained that the cysts are getting larger and more than previously seen, either see Urologist or get more imaging studies

## 2016-10-23 ENCOUNTER — Other Ambulatory Visit: Payer: Self-pay | Admitting: Family Medicine

## 2016-10-23 DIAGNOSIS — N281 Cyst of kidney, acquired: Secondary | ICD-10-CM

## 2016-10-23 LAB — IRON,TIBC AND FERRITIN PANEL
%SAT: 3 % — ABNORMAL LOW (ref 11–50)
Ferritin: 138 ng/mL (ref 20–288)
Iron: <10 ug/dL — ABNORMAL LOW (ref 45–160)
TIBC: 200 ug/dL — ABNORMAL LOW (ref 250–450)

## 2016-10-23 NOTE — Progress Notes (Signed)
Sending request for MRI

## 2016-10-23 NOTE — Telephone Encounter (Signed)
Ordered MRI as requested

## 2016-10-24 NOTE — Telephone Encounter (Signed)
Called and left a message to inform her that her MRI is on November 06, 2016 at 8 a.m.

## 2016-11-01 ENCOUNTER — Other Ambulatory Visit: Payer: Self-pay

## 2016-11-06 ENCOUNTER — Ambulatory Visit
Admission: RE | Admit: 2016-11-06 | Discharge: 2016-11-06 | Disposition: A | Discharge status: Home / Self Care | Payer: Federal, State, Local not specified - PPO | Source: Ambulatory Visit | Attending: Family Medicine | Admitting: Family Medicine

## 2016-11-06 DIAGNOSIS — N281 Cyst of kidney, acquired: Secondary | ICD-10-CM

## 2016-11-13 ENCOUNTER — Other Ambulatory Visit: Payer: Self-pay | Admitting: Family Medicine

## 2016-11-13 ENCOUNTER — Other Ambulatory Visit: Payer: Self-pay

## 2016-11-13 DIAGNOSIS — N281 Cyst of kidney, acquired: Secondary | ICD-10-CM

## 2016-11-13 NOTE — Progress Notes (Signed)
mri

## 2016-11-13 NOTE — Progress Notes (Unsigned)
Wrong Sirenia Whitis,CMA

## 2016-11-18 ENCOUNTER — Ambulatory Visit
Admission: RE | Admit: 2016-11-18 | Discharge: 2016-11-18 | Disposition: A | Discharge status: Home / Self Care | Payer: Federal, State, Local not specified - PPO | Source: Ambulatory Visit | Attending: Family Medicine | Admitting: Family Medicine

## 2016-11-26 ENCOUNTER — Ambulatory Visit: Payer: Federal, State, Local not specified - PPO | Admitting: Family Medicine

## 2016-12-25 ENCOUNTER — Other Ambulatory Visit: Payer: Self-pay | Admitting: Family Medicine

## 2016-12-25 NOTE — Telephone Encounter (Signed)
Patient requesting refill of Atorvastatin to [facility].  

## 2017-02-18 ENCOUNTER — Telehealth: Payer: Self-pay | Admitting: Family Medicine

## 2017-02-18 NOTE — Telephone Encounter (Signed)
PT IS HAVING ISSUE WITH BACK . PT NEEDS TO HAVE YOU CALL HERE ABOUT HAVING A MRI DONE SINCE THEY CAN NOT SEE HER TILL NOV AT DR [facility] OFFICE. THEY SUGGESTED TO SEE IF SHE COULD ORDER THE MRI SO THEY COULD LOOK AT THE TEST AND MAYBE GET HER AN EARLIER APPT.

## 2017-02-19 NOTE — Telephone Encounter (Signed)
She will need to be seen, unless Dr. Arnoldo Morale has an advise about what MRI to order

## 2017-02-20 NOTE — Telephone Encounter (Signed)
Left message for pt to call and schedule an appointment or to give Dr. Arnoldo Morale office a call and try to find out what MRI is needed and to call the office back

## 2017-02-20 NOTE — Telephone Encounter (Signed)
DONE. COMING IN TOMORROW 02-21-17

## 2017-02-21 ENCOUNTER — Encounter: Payer: Self-pay | Admitting: Family Medicine

## 2017-02-21 ENCOUNTER — Ambulatory Visit
Admission: RE | Admit: 2017-02-21 | Discharge: 2017-02-21 | Disposition: A | Discharge status: Home / Self Care | Payer: Federal, State, Local not specified - PPO | Source: Ambulatory Visit | Attending: Family Medicine | Admitting: Family Medicine

## 2017-02-21 ENCOUNTER — Ambulatory Visit (INDEPENDENT_AMBULATORY_CARE_PROVIDER_SITE_OTHER): Payer: Federal, State, Local not specified - PPO | Admitting: Family Medicine

## 2017-02-21 VITALS — BP 120/80 | HR 94 | Temp 98.1°F | Resp 18 | Ht 71.0 in | Wt 193.1 lb

## 2017-02-21 DIAGNOSIS — Z1239 Encounter for other screening for malignant neoplasm of breast: Secondary | ICD-10-CM

## 2017-02-21 DIAGNOSIS — M545 Low back pain, unspecified: Secondary | ICD-10-CM

## 2017-02-21 DIAGNOSIS — R05 Cough: Secondary | ICD-10-CM

## 2017-02-21 DIAGNOSIS — G8929 Other chronic pain: Secondary | ICD-10-CM | POA: Diagnosis not present

## 2017-02-21 DIAGNOSIS — E441 Mild protein-calorie malnutrition: Secondary | ICD-10-CM | POA: Diagnosis not present

## 2017-02-21 DIAGNOSIS — Z1231 Encounter for screening mammogram for malignant neoplasm of breast: Secondary | ICD-10-CM | POA: Diagnosis not present

## 2017-02-21 DIAGNOSIS — N183 Chronic kidney disease, stage 3 unspecified: Secondary | ICD-10-CM

## 2017-02-21 DIAGNOSIS — I700 Atherosclerosis of aorta: Secondary | ICD-10-CM | POA: Diagnosis not present

## 2017-02-21 DIAGNOSIS — R634 Abnormal weight loss: Secondary | ICD-10-CM

## 2017-02-21 DIAGNOSIS — R059 Cough, unspecified: Secondary | ICD-10-CM

## 2017-02-21 DIAGNOSIS — IMO0001 Reserved for inherently not codable concepts without codable children: Secondary | ICD-10-CM

## 2017-02-21 DIAGNOSIS — Z862 Personal history of diseases of the blood and blood-forming organs and certain disorders involving the immune mechanism: Secondary | ICD-10-CM | POA: Diagnosis not present

## 2017-02-21 DIAGNOSIS — R1110 Vomiting, unspecified: Secondary | ICD-10-CM

## 2017-02-21 DIAGNOSIS — M47896 Other spondylosis, lumbar region: Secondary | ICD-10-CM | POA: Insufficient documentation

## 2017-02-21 LAB — COMPLETE METABOLIC PANEL WITH GFR
ALT: 11 U/L (ref 6–29)
AST: 20 U/L (ref 10–35)
Albumin: 4.2 g/dL (ref 3.6–5.1)
Alkaline Phosphatase: 64 U/L (ref 33–130)
BUN: 22 mg/dL (ref 7–25)
CO2: 24 mmol/L (ref 20–31)
Calcium: 9.7 mg/dL (ref 8.6–10.4)
Chloride: 107 mmol/L (ref 98–110)
Creat: 1.55 mg/dL — ABNORMAL HIGH (ref 0.50–0.99)
GFR, Est African American: 41 mL/min — ABNORMAL LOW (ref >=60)
GFR, Est Non African American: 35 mL/min — ABNORMAL LOW (ref >=60)
Glucose, Bld: 96 mg/dL (ref 65–99)
Potassium: 4.8 mmol/L (ref 3.5–5.3)
Sodium: 141 mmol/L (ref 135–146)
Total Bilirubin: 0.5 mg/dL (ref 0.2–1.2)
Total Protein: 7.2 g/dL (ref 6.1–8.1)

## 2017-02-21 LAB — CBC WITH DIFFERENTIAL/PLATELET
Basophils Absolute: 38 cells/uL (ref 0–200)
Basophils Relative: 1 %
Eosinophils Absolute: 114 cells/uL (ref 15–500)
Eosinophils Relative: 3 %
HCT: 39.2 % (ref 35.0–45.0)
Hemoglobin: 12.6 g/dL (ref 11.7–15.5)
Lymphocytes Relative: 46 %
Lymphs Abs: 1748 cells/uL (ref 850–3900)
MCH: 29.4 pg (ref 27.0–33.0)
MCHC: 32.1 g/dL (ref 32.0–36.0)
MCV: 91.6 fL (ref 80.0–100.0)
MPV: 9.9 fL (ref 7.5–12.5)
Monocytes Absolute: 266 cells/uL (ref 200–950)
Monocytes Relative: 7 %
Neutro Abs: 1634 cells/uL (ref 1500–7800)
Neutrophils Relative %: 43 %
Platelets: 222 10*3/uL (ref 140–400)
RBC: 4.28 MIL/uL (ref 3.80–5.10)
RDW: 13.4 % (ref 11.0–15.0)
WBC: 3.8 10*3/uL (ref 3.8–10.8)

## 2017-02-21 LAB — TSH: TSH: 2.11 mIU/L

## 2017-02-21 MED ORDER — TRAMADOL HCL 50 MG PO TABS: 50.0000 mg | ORAL_TABLET | Freq: Three times a day (TID) | ORAL | 0 refills | Status: DC | PRN

## 2017-02-21 MED ORDER — PREDNISONE 10 MG (48) PO TBPK: ORAL_TABLET | ORAL | 0 refills | Status: DC

## 2017-02-21 NOTE — Progress Notes (Signed)
Name: Melissa [name]   MRN: [id number]    DOB: 03-09-1951   Date:02/21/2017       Progress Note  Subjective  Chief Complaint  Chief Complaint  Patient presents with  . Back Pain    pt requesting MRI    HPI  Left lower back pain: she was seen by Dr. Arnoldo Morale a couple of years ago and was advised to have MRI, however pain improved and she did not go for MRI. She was doing well, however about 5 weeks ago she was on vacation. She states pain is dull pain on left lower back about 4/10, but when she turns to the right pain is intense and sharp but brief. It is radiating to right lower back, sometimes sacral area, but does not radiate down leg. She denies saddle anesthesia, bowel or bladder incontinence. No weakness, but she feels like the pain is so intense that she stops walking.  Weight loss: she was in the 240 lbs in 2016, March 2017 she changed her diet because she was considered pre-diabetes, and her daughter scared her. She states her appetite has not changed, she eats healthy 6 days a week and has been walking 30  Minutes three days a week for the past 6 weeks. She denies change in bowel movements or blood in stools. She has noticed a productive cough for the past few months, no SOB, no wheezing. She has never smoked. Low albumin level   Atherosclerosis of Aorta: she is on statin therapy, last LDL was at goal  Chronic kidney insufficiency: we will recheck labs, avoids NSAID's.   Patient Active Problem List   Diagnosis Date Noted  . Low back pain without sciatica 02/21/2017  . Renal cyst 10/18/2016  . Atherosclerosis of aorta ([place]) 10/16/2016  . History of colonic polyps 09/25/2016  . Dyslipidemia 12/08/2015  . Gastroesophageal reflux disease without esophagitis 08/11/2015  . Chronic kidney disease (CKD), stage III (moderate) 07/02/2015  . Primary osteoarthritis of knee 06/13/2015  . Hip bursitis 06/13/2015  . RLS (restless legs syndrome) 06/13/2015  . Depression, major, in  remission ([place]) 06/13/2015  . B12 deficiency 06/13/2015  . History of Roux-en-Y gastric bypass 06/13/2015  . History of iron deficiency anemia 06/13/2015  . Vitamin D deficiency 06/13/2015  . Hypertension, benign 06/13/2015  . Allergic rhinitis, seasonal 06/13/2015  . Obesity (BMI 30.0-34.9) 06/13/2015  . Cervical radiculitis 01/04/2014  . Cervical spinal stenosis 01/04/2014  . Cervical osteoarthritis 01/04/2014    Past Surgical History:  Procedure Laterality Date  . BARIATRIC SURGERY    . CESAREAN SECTION     3 or more  . GASTRIC BYPASS    . TUBAL LIGATION      Family History  Problem Relation Age of Onset  . Hypercholesterolemia Mother   . Heart disease Mother   . Hypertension Mother   . Alcohol abuse Father   . Lung cancer Brother   . Alcohol abuse Brother   . Diabetes Mellitus II Sister   . Colon cancer Sister     Social History   Social History  . Marital status: Divorced    Spouse name: N/A  . Number of children: N/A  . Years of education: N/A   Occupational History  . Not on file.   Social History Main Topics  . Smoking status: Never Smoker  . Smokeless tobacco: Never Used  . Alcohol use No  . Drug use: No  . Sexual activity: Not Currently   Other Topics Concern  .  Not on file   Social History Narrative  . No narrative on file     Current Outpatient Prescriptions:  .  aspirin EC 81 MG tablet, Take 1 tablet (81 mg total) by mouth daily., Disp: 90 tablet, Rfl: 3 .  atorvastatin (LIPITOR) 40 MG tablet, TAKE 1 TABLET (40 MG TOTAL) BY MOUTH DAILY., Disp: 90 tablet, Rfl: 1 .  Cholecalciferol (VITAMIN D3 ADULT GUMMIES PO), Take 1 each by mouth daily., Disp: , Rfl:  .  fluticasone (FLONASE) 50 MCG/ACT nasal spray, Place 2 sprays into both nostrils as needed. , Disp: , Rfl: 2 .  losartan (COZAAR) 25 MG tablet, TAKE 1 TABLET (25 MG TOTAL) BY MOUTH DAILY., Disp: 90 tablet, Rfl: 1 .  magnesium oxide (MAG-OX) 400 MG tablet, Take 400 mg by mouth daily.  Reported on 12/08/2015, Disp: , Rfl:  .  traMADol (ULTRAM) 50 MG tablet, Take 1 tablet (50 mg total) by mouth every 8 (eight) hours as needed., Disp: 30 tablet, Rfl: 0  Allergies  Allergen Reactions  . Contrast Media [Iodinated Diagnostic Agents]   . Shellfish Allergy     Edema     ROS  Constitutional: Negative for fever, positive for weight change.  Respiratory: Positive for cough but no  shortness of breath.   Cardiovascular: Negative for chest pain or palpitations.  Gastrointestinal: Negative for abdominal pain, no bowel changes.  Musculoskeletal: Positive for gait problem ( back gives out at the time)  No  joint swelling.  Skin: Negative for rash.  Neurological: Negative for dizziness or headache.  No other specific complaints in a complete review of systems (except as listed in HPI above).  Objective  Vitals:   02/21/17 0810  BP: 120/80  Pulse: 94  Resp: 18  Temp: 98.1 F (36.7 C)  SpO2: 96%  Weight: 193 lb 2 oz (87.6 kg)  Height: 5\' 11"  (1.803 m)    Body mass index is 26.94 kg/m.  Physical Exam  Constitutional: Patient appears well-developed and well-nourished.   No distress.  HEENT: head atraumatic, normocephalic, pupils equal and reactive to light, neck supple, throat within normal limits Cardiovascular: Normal rate, regular rhythm and normal heart sounds.  No murmur heard. No BLE edema. Pulmonary/Chest: Effort normal and breath sounds normal. No respiratory distress. Abdominal: Soft.  There is no tenderness. Muscular Skeletal:no  pain during palpation of left lower back, negative straight leg raise. Positive straight leg raise Psychiatric: Patient has a normal mood and affect. behavior is normal. Judgment and thought content normal.  PHQ2/9: Depression screen [facility] 2/9 08/21/2016 05/07/2016 12/08/2015 06/13/2015  Decreased Interest 0 0 0 0  Down, Depressed, Hopeless 0 0 0 0  PHQ - 2 Score 0 0 0 0     Fall Risk: Fall Risk  08/21/2016 05/07/2016 12/08/2015  06/13/2015  Falls in the past year? No No Yes Yes  Number falls in past yr: - - 1 1  Injury with Fall? - - No No      Assessment & Plan  1. Atherosclerosis of aorta ([place])  On statin   2. Breast cancer screening  - MM Digital Screening; Future  3. Chronic kidney disease (CKD), stage III (moderate)  - COMPLETE METABOLIC PANEL WITH GFR - CBC with Differential/Platelet  4. History of iron deficiency anemia  - Iron, TIBC and Ferritin Panel  5. Left-sided low back pain without sciatica, unspecified chronicity  - DG Lumbar Spine Complete; Future - traMADol (ULTRAM) 50 MG tablet; Take 1 tablet (50 mg total) by  mouth every 8 (eight) hours as needed.  Dispense: 30 tablet; Refill: 0 rx of prednisone taper, discussed importance of taking with food  6. Mild protein-calorie malnutrition (HCC)  - C-reactive protein - TSH - Sedimentation rate - HIV antibody  7. Weight loss  - C-reactive protein - TSH - Sedimentation rate - HIV antibody - POC Hemoccult Bld/Stl (3-Cd Home Screen); Future  8. Weight loss  Discussed possible causes, we need to look for GI bleed, seen by GI and they postponed for 5 years, however she has family history of colon cancer and is losing weight, we will check hemoccult and if positive refer back to GI, get mammogram and get inflammatory markers - C-reactive protein - TSH - Sedimentation rate - HIV antibody - POC Hemoccult Bld/Stl (3-Cd Home Screen); Future  10. Regurgitation  She has noticed that she has regurgitation when laying down, very seldom , and no heartburn, advised to raise head of bed and not to go to bed after eating.

## 2017-02-22 LAB — IRON,TIBC AND FERRITIN PANEL
%SAT: 32 % (ref 11–50)
Ferritin: 39 ng/mL (ref 20–288)
Iron: 98 ug/dL (ref 45–160)
TIBC: 308 ug/dL (ref 250–450)

## 2017-02-22 LAB — SEDIMENTATION RATE: Sed Rate: 4 mm/hr (ref 0–30)

## 2017-02-22 LAB — HIV ANTIBODY (ROUTINE TESTING W REFLEX): HIV 1&2 Ab, 4th Generation: NONREACTIVE

## 2017-02-24 LAB — C-REACTIVE PROTEIN: CRP: 0.4 mg/L (ref <8.0)

## 2017-02-25 ENCOUNTER — Other Ambulatory Visit: Payer: Self-pay | Admitting: Family Medicine

## 2017-02-25 DIAGNOSIS — N183 Chronic kidney disease, stage 3 unspecified: Secondary | ICD-10-CM

## 2017-02-27 ENCOUNTER — Telehealth: Payer: Self-pay | Admitting: Family Medicine

## 2017-02-27 NOTE — Telephone Encounter (Signed)
Done

## 2017-02-27 NOTE — Telephone Encounter (Signed)
PT SAID THAT THE DR WANTED HER TO HAVE A STOOL SAMPLE DONE ON  THE STOOL CARD. WOULD YOU PLEASE BRING ONE TO THE FRONT FOR SHE IS COMING ON Friday TO PICK THIS UP.

## 2017-03-05 ENCOUNTER — Ambulatory Visit
Admission: RE | Admit: 2017-03-05 | Discharge: 2017-03-05 | Disposition: A | Discharge status: Home / Self Care | Payer: Federal, State, Local not specified - PPO | Source: Ambulatory Visit | Attending: Family Medicine | Admitting: Family Medicine

## 2017-03-05 DIAGNOSIS — Z1231 Encounter for screening mammogram for malignant neoplasm of breast: Secondary | ICD-10-CM | POA: Diagnosis not present

## 2017-03-05 DIAGNOSIS — Z1239 Encounter for other screening for malignant neoplasm of breast: Secondary | ICD-10-CM

## 2017-03-25 ENCOUNTER — Ambulatory Visit (INDEPENDENT_AMBULATORY_CARE_PROVIDER_SITE_OTHER): Payer: Federal, State, Local not specified - PPO | Admitting: Family Medicine

## 2017-03-25 ENCOUNTER — Encounter: Payer: Self-pay | Admitting: Family Medicine

## 2017-03-25 VITALS — BP 118/68 | HR 100 | Temp 97.8°F | Resp 18 | Ht 71.0 in | Wt 199.2 lb

## 2017-03-25 DIAGNOSIS — R300 Dysuria: Secondary | ICD-10-CM

## 2017-03-25 DIAGNOSIS — Z862 Personal history of diseases of the blood and blood-forming organs and certain disorders involving the immune mechanism: Secondary | ICD-10-CM | POA: Diagnosis not present

## 2017-03-25 DIAGNOSIS — R7303 Prediabetes: Secondary | ICD-10-CM | POA: Diagnosis not present

## 2017-03-25 DIAGNOSIS — N183 Chronic kidney disease, stage 3 unspecified: Secondary | ICD-10-CM

## 2017-03-25 DIAGNOSIS — G8929 Other chronic pain: Secondary | ICD-10-CM | POA: Diagnosis not present

## 2017-03-25 DIAGNOSIS — F325 Major depressive disorder, single episode, in full remission: Secondary | ICD-10-CM | POA: Diagnosis not present

## 2017-03-25 DIAGNOSIS — E785 Hyperlipidemia, unspecified: Secondary | ICD-10-CM

## 2017-03-25 DIAGNOSIS — R059 Cough, unspecified: Secondary | ICD-10-CM

## 2017-03-25 DIAGNOSIS — M545 Low back pain, unspecified: Secondary | ICD-10-CM

## 2017-03-25 DIAGNOSIS — R05 Cough: Secondary | ICD-10-CM

## 2017-03-25 DIAGNOSIS — Z9884 Bariatric surgery status: Secondary | ICD-10-CM

## 2017-03-25 DIAGNOSIS — I700 Atherosclerosis of aorta: Secondary | ICD-10-CM | POA: Diagnosis not present

## 2017-03-25 DIAGNOSIS — I10 Essential (primary) hypertension: Secondary | ICD-10-CM

## 2017-03-25 MED ORDER — TRAMADOL HCL 50 MG PO TABS: 50.0000 mg | ORAL_TABLET | Freq: Three times a day (TID) | ORAL | 0 refills | Status: DC | PRN

## 2017-03-25 MED ORDER — CEFDINIR 300 MG PO CAPS: 300.0000 mg | ORAL_CAPSULE | Freq: Two times a day (BID) | ORAL | 0 refills | Status: DC

## 2017-03-25 MED ORDER — METAXALONE 800 MG PO TABS: 800.0000 mg | ORAL_TABLET | Freq: Three times a day (TID) | ORAL | 0 refills | Status: DC

## 2017-03-25 MED ORDER — ALBUTEROL SULFATE (2.5 MG/3ML) 0.083% IN NEBU
2.5000 mg | INHALATION_SOLUTION | Freq: Once | RESPIRATORY_TRACT | Status: AC
  Administered 2017-03-25: 2.5 mg via RESPIRATORY_TRACT

## 2017-03-25 MED ORDER — LOSARTAN POTASSIUM 25 MG PO TABS: 25.0000 mg | ORAL_TABLET | Freq: Every day | ORAL | 1 refills | Status: DC

## 2017-03-25 MED ORDER — FLUTICASONE FUROATE-VILANTEROL 100-25 MCG/INH IN AEPB: 1.0000 | INHALATION_SPRAY | Freq: Every day | RESPIRATORY_TRACT | 0 refills | Status: DC

## 2017-03-25 NOTE — Progress Notes (Signed)
Name: Melissa [name]   MRN: [id number]    DOB: Jun 10, 1951   Date:03/25/2017       Progress Note  Subjective  Chief Complaint  Chief Complaint  Patient presents with  . Depression    1 month follow up no issues  . Hyperlipidemia  . Gastroesophageal Reflux  . Chronic Kidney Disease  . Hypertension  . possible UTI    pt stated that she gets them often and feels like one may be starting would like urine to be checked                                                      HPI  Left lower back pain: she was seen by Dr. Arnoldo Morale a couple of years ago and was advised to have MRI, however pain improved and she did not go for MRI. She was stable, however 2 months ago had a flare, seen by me one month ago, took Prednisone taper, and pain is back to baseline, always has pain, taking Tylenol 4 times a day, average pain 5/10. Not affecting her sleep, no bowel or bladder incontinence. X-ray showed faced disease worse on right side. Currently no radiculitis.   Depression Mild in remission: she is still dating, he lives [place], she enjoys travelling with him and does not plan on moving up [place].   Weight loss: she was in the 240 lbs in 2016, March 2017 she changed her diet because she was considered pre-diabetes, and her daughter scared her. She states her appetite has not changed, she eats healthy 6 days a week and has been walking 30   minutes three days a week She denies change in bowel movements or blood in stools. She gained 6 lbs when she took prednisone a few months ago and has not been able to lose it again, frustrated about it.   Atherosclerosis of Aorta: she is on statin therapy, last LDL was at goal, also on aspirin 81 mg daily and no side effects  Chronic kidney insufficiency: we will recheck labs, avoids NSAID's.   Urinary discomfort: she usually does not have symptoms of UTI, and was on vacation, swimming in coves and hot tubs and felt some dysuria, so she is concerned it could be an  UTI, no upper back pain, fever or chills.   Cough: going on for months, CXR showed hyperinflation, she never smoked but states has a history of childhood asthma, today she states cough is productive, no SOB or wheezing. We will check spirometry   Patient Active Problem List   Diagnosis Date Noted  . Low back pain without sciatica 02/21/2017  . Renal cyst 10/18/2016  . Atherosclerosis of aorta ([place]) 10/16/2016  . History of colonic polyps 09/25/2016  . Dyslipidemia 12/08/2015  . Gastroesophageal reflux disease without esophagitis 08/11/2015  . Chronic kidney disease (CKD), stage III (moderate) 07/02/2015  . Primary osteoarthritis of knee 06/13/2015  . Hip bursitis 06/13/2015  . RLS (restless legs syndrome) 06/13/2015  . Depression, major, in remission ([place]) 06/13/2015  . B12 deficiency 06/13/2015  . History of Roux-en-Y gastric bypass 06/13/2015  . History of iron deficiency anemia 06/13/2015  . Vitamin D deficiency 06/13/2015  . Hypertension, benign 06/13/2015  . Allergic rhinitis, seasonal 06/13/2015  . Obesity (BMI 30.0-34.9) 06/13/2015  . Cervical radiculitis 01/04/2014  . Cervical  spinal stenosis 01/04/2014  . Cervical osteoarthritis 01/04/2014    Past Surgical History:  Procedure Laterality Date  . BARIATRIC SURGERY    . CESAREAN SECTION     3 or more  . GASTRIC BYPASS    . TUBAL LIGATION      Family History  Problem Relation Age of Onset  . Hypercholesterolemia Mother   . Heart disease Mother   . Hypertension Mother   . Alcohol abuse Father   . Lung cancer Brother   . Alcohol abuse Brother   . Diabetes Mellitus II Sister   . Colon cancer Sister   . Breast cancer Neg Hx     Social History   Social History  . Marital status: Divorced    Spouse name: N/A  . Number of children: N/A  . Years of education: N/A   Occupational History  . Not on file.   Social History Main Topics  . Smoking status: Never Smoker  . Smokeless tobacco: Never Used  . Alcohol  use No  . Drug use: No  . Sexual activity: Not Currently   Other Topics Concern  . Not on file   Social History Narrative  . No narrative on file     Current Outpatient Prescriptions:  .  aspirin EC 81 MG tablet, Take 1 tablet (81 mg total) by mouth daily., Disp: 90 tablet, Rfl: 3 .  atorvastatin (LIPITOR) 40 MG tablet, TAKE 1 TABLET (40 MG TOTAL) BY MOUTH DAILY., Disp: 90 tablet, Rfl: 1 .  Cholecalciferol (VITAMIN D3 ADULT GUMMIES PO), Take 1 each by mouth daily., Disp: , Rfl:  .  fluticasone (FLONASE) 50 MCG/ACT nasal spray, Place 2 sprays into both nostrils as needed. , Disp: , Rfl: 2 .  losartan (COZAAR) 25 MG tablet, Take 1 tablet (25 mg total) by mouth daily., Disp: 90 tablet, Rfl: 1 .  magnesium oxide (MAG-OX) 400 MG tablet, Take 400 mg by mouth daily. Reported on 12/08/2015, Disp: , Rfl:  .  traMADol (ULTRAM) 50 MG tablet, Take 1 tablet (50 mg total) by mouth every 8 (eight) hours as needed., Disp: 90 tablet, Rfl: 0  Allergies  Allergen Reactions  . Contrast Media [Iodinated Diagnostic Agents]   . Shellfish Allergy     Edema     ROS  Constitutional: Negative for fever , positive for weight change.  Respiratory: Positive  for cough no  shortness of breath.   Cardiovascular: Negative for chest pain or palpitations.  Gastrointestinal: Negative for abdominal pain, no bowel changes.  Musculoskeletal: Negative for gait problem or joint swelling.  Skin: Negative for rash.  Neurological: Negative for dizziness, positive for mild intermittent  headache.  No other specific complaints in a complete review of systems (except as listed in HPI above).   Objective  Vitals:   03/25/17 0924  BP: 118/68  Pulse: 100  Resp: 18  Temp: 97.8 F (36.6 C)  SpO2: 95%  Weight: 199 lb 3 oz (90.4 kg)  Height: 5' 11"  (1.803 m)    Body mass index is 27.78 kg/m.  Physical Exam  Constitutional: Patient appears well-developed and well-nourished. Overweight  No distress.  HEENT: head  atraumatic, normocephalic, pupils equal and reactive to light,  neck supple, throat within normal limits Cardiovascular: Normal rate, regular rhythm and normal heart sounds.  No murmur heard. No BLE edema. Pulmonary/Chest: Effort normal and breath sounds normal. No respiratory distress. Abdominal: Soft.  There is no tenderness. Psychiatric: Patient has a normal mood and affect. behavior is  normal. Judgment and thought content normal. Muscular Skeletal: no pain during palpation of lumbar spine, negative straight leg raise.   Recent Results (from the past 2160 hour(s))  COMPLETE METABOLIC PANEL WITH GFR     Status: Abnormal   Collection Time: 02/21/17  9:00 AM  Result Value Ref Range   Sodium 141 135 - 146 mmol/L   Potassium 4.8 3.5 - 5.3 mmol/L   Chloride 107 98 - 110 mmol/L   CO2 24 20 - 31 mmol/L   Glucose, Bld 96 65 - 99 mg/dL   BUN 22 7 - 25 mg/dL   Creat 1.55 (H) 0.50 - 0.99 mg/dL    Comment:   For patients > or = 64 years of age: The upper reference limit for Creatinine is approximately 13% higher for people identified as African-American.      Total Bilirubin 0.5 0.2 - 1.2 mg/dL   Alkaline Phosphatase 64 33 - 130 U/L   AST 20 10 - 35 U/L   ALT 11 6 - 29 U/L   Total Protein 7.2 6.1 - 8.1 g/dL   Albumin 4.2 3.6 - 5.1 g/dL   Calcium 9.7 8.6 - 10.4 mg/dL   GFR, Est African American 41 (L) >=60 mL/min   GFR, Est Non African American 35 (L) >=60 mL/min  CBC with Differential/Platelet     Status: None   Collection Time: 02/21/17  9:00 AM  Result Value Ref Range   WBC 3.8 3.8 - 10.8 K/uL   RBC 4.28 3.80 - 5.10 MIL/uL   Hemoglobin 12.6 11.7 - 15.5 g/dL   HCT 39.2 35.0 - 45.0 %   MCV 91.6 80.0 - 100.0 fL   MCH 29.4 27.0 - 33.0 pg   MCHC 32.1 32.0 - 36.0 g/dL   RDW 13.4 11.0 - 15.0 %   Platelets 222 140 - 400 K/uL   MPV 9.9 7.5 - 12.5 fL   Neutro Abs 1,634 1,500 - 7,800 cells/uL   Lymphs Abs 1,748 850 - 3,900 cells/uL   Monocytes Absolute 266 200 - 950 cells/uL    Eosinophils Absolute 114 15 - 500 cells/uL   Basophils Absolute 38 0 - 200 cells/uL   Neutrophils Relative % 43 %   Lymphocytes Relative 46 %   Monocytes Relative 7 %   Eosinophils Relative 3 %   Basophils Relative 1 %   Smear Review Criteria for review not met   Iron, TIBC and Ferritin Panel     Status: None   Collection Time: 02/21/17  9:00 AM  Result Value Ref Range   Ferritin 39 20 - 288 ng/mL   Iron 98 45 - 160 ug/dL   TIBC 308 250 - 450 ug/dL   %SAT 32 11 - 50 %  C-reactive protein     Status: None   Collection Time: 02/21/17  9:00 AM  Result Value Ref Range   CRP 0.4 <8.0 mg/L  TSH     Status: None   Collection Time: 02/21/17  9:00 AM  Result Value Ref Range   TSH 2.11 mIU/L    Comment:   Reference Range   > or = 20 Years  0.40-4.50   Pregnancy Range First trimester  0.26-2.66 Second trimester 0.55-2.73 Third trimester  0.43-2.91     Sedimentation rate     Status: None   Collection Time: 02/21/17  9:00 AM  Result Value Ref Range   Sed Rate 4 0 - 30 mm/hr  HIV antibody     Status: None  Collection Time: 02/21/17  9:00 AM  Result Value Ref Range   HIV 1&2 Ab, 4th Generation NONREACTIVE NONREACTIVE    Comment:   HIV-1 antigen and HIV-1/HIV-2 antibodies were not detected.  There is no laboratory evidence of HIV infection.   HIV-1/2 Antibody Diff        Not indicated. HIV-1 RNA, Qual TMA          Not indicated.     PLEASE NOTE: This information has been disclosed to you from records whose confidentiality may be protected by state law. If your state requires such protection, then the state law prohibits you from making any further disclosure of the information without the specific written consent of the person to whom it pertains, or as otherwise permitted by law. A general authorization for the release of medical or other information is NOT sufficient for this purpose.   The performance of this assay has not been clinically validated in patients less than  81 years old.   For additional information please refer to http://education.questdiagnostics.com/faq/FAQ106.  (This link is being provided for informational/educational purposes only.)        PHQ2/9: Depression screen [facility] 2/9 08/21/2016 05/07/2016 12/08/2015 06/13/2015  Decreased Interest 0 0 0 0  Down, Depressed, Hopeless 0 0 0 0  PHQ - 2 Score 0 0 0 0     Fall Risk: Fall Risk  08/21/2016 05/07/2016 12/08/2015 06/13/2015  Falls in the past year? No No Yes Yes  Number falls in past yr: - - 1 1  Injury with Fall? - - No No     Assessment & Plan  1. Atherosclerosis of aorta ([place])  Discussed X-ray results, she is on statin therapy baby aspirin - discussed chewable type  2. Depression, major, in remission ([place])  Doing well, travelling  3. Chronic left-sided low back pain without sciatica  - traMADol (ULTRAM) 50 MG tablet; Take 1 tablet (50 mg total) by mouth every 8 (eight) hours as needed.  Dispense: 90 tablet; Refill: 0 - metaxalone (SKELAXIN) 800 MG tablet; Take 1 tablet (800 mg total) by mouth 3 (three) times daily.  Dispense: 90 tablet; Refill: 0  4. History of Roux-en-Y gastric bypass  She gained some weight since she took prednisone but weight is stable now  5. Prediabetes  Continue life style modification   6. Dyslipidemia  Continue statin therapy and aspirin daily   7. Hypertension, benign  - losartan (COZAAR) 25 MG tablet; Take 1 tablet (25 mg total) by mouth daily.  Dispense: 90 tablet; Refill: 1  8. Cough  - Spirometry: Pre & Post Eval - albuterol (PROVENTIL) (2.5 MG/3ML) 0.083% nebulizer solution 2.5 mg; Take 3 mLs (2.5 mg total) by nebulization once. - fluticasone furoate-vilanterol (BREO ELLIPTA) 100-25 MCG/INH AEPB; Inhale 1 puff into the lungs daily.  Dispense: 28 each; Refill: 0 - cefdinir (OMNICEF) 300 MG capsule; Take 1 capsule (300 mg total) by mouth 2 (two) times daily.  Dispense: 14 capsule; Refill: 0  Normal spirometry, we will treat with  antibiotics and inhaler for possible bronchitis and if not better, consider referral to pulmonologist   9. Dysuria  - Urine Culture  10. History of iron deficiency anemia  Doing well on iron supplementation, but she needs to bring hemoccult cards  11. Chronic kidney disease (CKD), stage III (moderate)  - BASIC METABOLIC PANEL WITH GFR

## 2017-03-26 LAB — URINE CULTURE: Organism ID, Bacteria: NO GROWTH

## 2017-04-08 ENCOUNTER — Telehealth: Payer: Self-pay | Admitting: Family Medicine

## 2017-04-08 NOTE — Telephone Encounter (Signed)
We can try, but sometimes her employer needs more documentation

## 2017-04-08 NOTE — Telephone Encounter (Signed)
PT SAID THAT SHE SEEN DR ON THE 7TH AND DR GAVE HER MUSCLE RELAXERS AND DID NOT MISS ANY WORK THE REST OF THE WEEK, BUT AFTER THE WEEKEND SHE HAD TO BE OUT WITH IT STARTING 13TH -17TH AND RETURNED [place].WANTS TO KNOW IF YOU COULD GIVE HER A NOTE FOR THOSE DAYS SHE HAD TO BE OUT WITH HER BACK. IF YOU CAN COULD IT BE FAXED TO [phone] AND WANTS [place] TO LET HER KNOW WE ARE DOING SO.

## 2017-04-15 ENCOUNTER — Other Ambulatory Visit: Payer: Self-pay | Admitting: Nephrology

## 2017-04-15 DIAGNOSIS — R319 Hematuria, unspecified: Secondary | ICD-10-CM

## 2017-04-15 DIAGNOSIS — N281 Cyst of kidney, acquired: Secondary | ICD-10-CM

## 2017-04-23 ENCOUNTER — Telehealth: Payer: Self-pay | Admitting: Family Medicine

## 2017-04-23 NOTE — Telephone Encounter (Signed)
PT SAID THAT THE LAST VISIT HER AND DR SOWLES DISCUSSED IF HER COUGH DID NOT GET ANY BETTER THAT SHE WOULD REFER HER TO A LUNG SPEC. PT IS ASKING THAT THIS BE DONE.

## 2017-04-24 ENCOUNTER — Other Ambulatory Visit: Payer: Self-pay | Admitting: Family Medicine

## 2017-04-24 DIAGNOSIS — R05 Cough: Secondary | ICD-10-CM

## 2017-04-24 DIAGNOSIS — R059 Cough, unspecified: Secondary | ICD-10-CM

## 2017-04-24 NOTE — Telephone Encounter (Signed)
Placed referral  

## 2017-05-07 ENCOUNTER — Encounter: Payer: Self-pay | Admitting: Family Medicine

## 2017-05-07 ENCOUNTER — Ambulatory Visit (INDEPENDENT_AMBULATORY_CARE_PROVIDER_SITE_OTHER): Payer: Federal, State, Local not specified - PPO | Admitting: Family Medicine

## 2017-05-07 VITALS — BP 122/78 | HR 105 | Temp 98.5°F | Resp 16 | Ht 71.0 in | Wt 198.6 lb

## 2017-05-07 DIAGNOSIS — I700 Atherosclerosis of aorta: Secondary | ICD-10-CM | POA: Diagnosis not present

## 2017-05-07 DIAGNOSIS — R6889 Other general symptoms and signs: Secondary | ICD-10-CM | POA: Diagnosis not present

## 2017-05-07 DIAGNOSIS — J018 Other acute sinusitis: Secondary | ICD-10-CM | POA: Diagnosis not present

## 2017-05-07 DIAGNOSIS — R9430 Abnormal result of cardiovascular function study, unspecified: Secondary | ICD-10-CM | POA: Diagnosis not present

## 2017-05-07 DIAGNOSIS — Z862 Personal history of diseases of the blood and blood-forming organs and certain disorders involving the immune mechanism: Secondary | ICD-10-CM | POA: Diagnosis not present

## 2017-05-07 DIAGNOSIS — R05 Cough: Secondary | ICD-10-CM | POA: Diagnosis not present

## 2017-05-07 DIAGNOSIS — Z23 Encounter for immunization: Secondary | ICD-10-CM

## 2017-05-07 DIAGNOSIS — R059 Cough, unspecified: Secondary | ICD-10-CM

## 2017-05-07 MED ORDER — LORATADINE 10 MG PO TABS: 10.0000 mg | ORAL_TABLET | Freq: Two times a day (BID) | ORAL | 0 refills | Status: DC

## 2017-05-07 NOTE — Progress Notes (Signed)
Name: Melissa [name]   MRN: [id number]    DOB: 08-23-1950   Date:05/07/2017       Progress Note  Subjective  Chief Complaint  Chief Complaint  Patient presents with  . Fatigue    for about a month  . Sinus Problem    post nasal drip and headache for 1 week  . 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 Less than 200 feet, now also getting SOB even when goes from her bedroom to her bathroom. Having to seat down to iron clothes. Cough has almost resolved, but still has an appointment coming up with pulmonologist. No lower extremity edema, she denies orthopnea - she uses two pillows but is chronic and is for her back. She states she has intermittent chest pain ( seen by Dr. Yvone Neu last year - had Echo 40-50% - normal stress test). She states around July she had anterior chest pain that radiated to right scapular area. She lost to follow up with cardiologist. She has a history of anemia, but last labs ( 03/2017) normalized. She states she has motivation, just feels physically tired.   Sinus: started about one week ago, rhinorrhea, post-nasal drainage, left frontal headache, no fever, normal appetite.  Using nasal spray but allergy medication or otc medication   Patient Active Problem List   Diagnosis Date Noted  . Cardiac LV ejection fraction of 40-49% 05/07/2017  . Low back pain without sciatica 02/21/2017  . Renal cyst 10/18/2016  . Atherosclerosis of aorta ([place]) 10/16/2016  . History of colonic polyps 09/25/2016  . Dyslipidemia 12/08/2015  . Gastroesophageal reflux disease without esophagitis 08/11/2015  . Chronic kidney disease (CKD), stage III (moderate) 07/02/2015  . Primary osteoarthritis of knee 06/13/2015  . Hip bursitis 06/13/2015  . RLS (restless legs syndrome) 06/13/2015  . Depression, major, in remission ([place]) 06/13/2015  . B12 deficiency 06/13/2015  . History of  Roux-en-Y gastric bypass 06/13/2015  . History of iron deficiency anemia 06/13/2015  . Vitamin D deficiency 06/13/2015  . Hypertension, benign 06/13/2015  . Allergic rhinitis, seasonal 06/13/2015  . Obesity (BMI 30.0-34.9) 06/13/2015  . Cervical radiculitis 01/04/2014  . Cervical spinal stenosis 01/04/2014  . Cervical osteoarthritis 01/04/2014    Past Surgical History:  Procedure Laterality Date  . BARIATRIC SURGERY    . CESAREAN SECTION     3 or more  . GASTRIC BYPASS    . TUBAL LIGATION      Family History  Problem Relation Age of Onset  . Hypercholesterolemia Mother   . Heart disease Mother   . Hypertension Mother   . Alcohol abuse Father   . Lung cancer Brother   . Alcohol abuse Brother   . Diabetes Mellitus II Sister   . Colon cancer Sister   . Breast cancer Neg Hx     Social History   Social History  . Marital status: Divorced    Spouse name: N/A  . Number of children: N/A  . Years of education: N/A   Occupational History  . Not on file.   Social History Main Topics  . Smoking status: Never Smoker  . Smokeless tobacco: Never Used  . Alcohol use No  . Drug use: No  . Sexual activity: Not Currently   Other Topics Concern  . Not on file   Social History Narrative  . No narrative on file  Current Outpatient Prescriptions:  .  aspirin EC 81 MG tablet, Take 1 tablet (81 mg total) by mouth daily., Disp: 90 tablet, Rfl: 3 .  atorvastatin (LIPITOR) 40 MG tablet, TAKE 1 TABLET (40 MG TOTAL) BY MOUTH DAILY., Disp: 90 tablet, Rfl: 1 .  Cholecalciferol (VITAMIN D3 ADULT GUMMIES PO), Take 1 each by mouth daily., Disp: , Rfl:  .  fluticasone (FLONASE) 50 MCG/ACT nasal spray, Place 2 sprays into both nostrils as needed. , Disp: , Rfl: 2 .  fluticasone furoate-vilanterol (BREO ELLIPTA) 100-25 MCG/INH AEPB, Inhale 1 puff into the lungs daily., Disp: 28 each, Rfl: 0 .  losartan (COZAAR) 25 MG tablet, Take 1 tablet (25 mg total) by mouth daily., Disp: 90 tablet,  Rfl: 1 .  magnesium oxide (MAG-OX) 400 MG tablet, Take 400 mg by mouth daily. Reported on 12/08/2015, Disp: , Rfl:  .  metaxalone (SKELAXIN) 800 MG tablet, Take 1 tablet (800 mg total) by mouth 3 (three) times daily., Disp: 90 tablet, Rfl: 0 .  traMADol (ULTRAM) 50 MG tablet, Take 1 tablet (50 mg total) by mouth every 8 (eight) hours as needed., Disp: 90 tablet, Rfl: 0 .  loratadine (CLARITIN) 10 MG tablet, Take 1 tablet (10 mg total) by mouth 2 (two) times daily., Disp: 60 tablet, Rfl: 0  Allergies  Allergen Reactions  . Contrast Media [Iodinated Diagnostic Agents]   . Shellfish Allergy     Edema     ROS  Constitutional: Negative for fever or weight change.  Respiratory: Positive  for cough and shortness of breath with activity.   Cardiovascular: Negative for chest pain or palpitations.  Gastrointestinal: Negative for abdominal pain, no bowel changes.  Musculoskeletal: Negative for gait problem or joint swelling.  Skin: Negative for rash.  Neurological: Negative for dizziness , positive for  headache.  No other specific complaints in a complete review of systems (except as listed in HPI above).  Objective  Vitals:   05/07/17 0908  BP: 122/78  Pulse: (!) 105  Resp: 16  Temp: 98.5 F (36.9 C)  SpO2: 96%  Weight: 198 lb 9 oz (90.1 kg)  Height: 5' 11"  (1.803 m)    Body mass index is 27.69 kg/m.  Physical Exam  Constitutional: Patient appears well-developed and well-nourished. Overweight. No distress.  HEENT: head atraumatic, normocephalic, pupils equal and reactive to light, ears  Normal TM, neck supple, throat within normal limits. No sinus pain during percussion, boggy turbinate on left side Cardiovascular: Normal rate, regular rhythm and normal heart sounds.  No murmur heard. No BLE edema. Pulmonary/Chest: Effort normal and breath sounds normal. No respiratory distress. Abdominal: Soft.  There is no tenderness. Psychiatric: Patient has a normal mood and affect.  behavior is normal. Judgment and thought content normal.  Recent Results (from the past 2160 hour(s))  COMPLETE METABOLIC PANEL WITH GFR     Status: Abnormal   Collection Time: 02/21/17  9:00 AM  Result Value Ref Range   Sodium 141 135 - 146 mmol/L   Potassium 4.8 3.5 - 5.3 mmol/L   Chloride 107 98 - 110 mmol/L   CO2 24 20 - 31 mmol/L   Glucose, Bld 96 65 - 99 mg/dL   BUN 22 7 - 25 mg/dL   Creat 1.55 (H) 0.50 - 0.99 mg/dL    Comment:   For patients > or = 63 years of age: The upper reference limit for Creatinine is approximately 13% higher for people identified as African-American.      Total  Bilirubin 0.5 0.2 - 1.2 mg/dL   Alkaline Phosphatase 64 33 - 130 U/L   AST 20 10 - 35 U/L   ALT 11 6 - 29 U/L   Total Protein 7.2 6.1 - 8.1 g/dL   Albumin 4.2 3.6 - 5.1 g/dL   Calcium 9.7 8.6 - 10.4 mg/dL   GFR, Est African American 41 (L) >=60 mL/min   GFR, Est Non African American 35 (L) >=60 mL/min  CBC with Differential/Platelet     Status: None   Collection Time: 02/21/17  9:00 AM  Result Value Ref Range   WBC 3.8 3.8 - 10.8 K/uL   RBC 4.28 3.80 - 5.10 MIL/uL   Hemoglobin 12.6 11.7 - 15.5 g/dL   HCT 39.2 35.0 - 45.0 %   MCV 91.6 80.0 - 100.0 fL   MCH 29.4 27.0 - 33.0 pg   MCHC 32.1 32.0 - 36.0 g/dL   RDW 13.4 11.0 - 15.0 %   Platelets 222 140 - 400 K/uL   MPV 9.9 7.5 - 12.5 fL   Neutro Abs 1,634 1,500 - 7,800 cells/uL   Lymphs Abs 1,748 850 - 3,900 cells/uL   Monocytes Absolute 266 200 - 950 cells/uL   Eosinophils Absolute 114 15 - 500 cells/uL   Basophils Absolute 38 0 - 200 cells/uL   Neutrophils Relative % 43 %   Lymphocytes Relative 46 %   Monocytes Relative 7 %   Eosinophils Relative 3 %   Basophils Relative 1 %   Smear Review Criteria for review not met   Iron, TIBC and Ferritin Panel     Status: None   Collection Time: 02/21/17  9:00 AM  Result Value Ref Range   Ferritin 39 20 - 288 ng/mL   Iron 98 45 - 160 ug/dL   TIBC 308 250 - 450 ug/dL   %SAT 32 11 - 50 %   C-reactive protein     Status: None   Collection Time: 02/21/17  9:00 AM  Result Value Ref Range   CRP 0.4 <8.0 mg/L  TSH     Status: None   Collection Time: 02/21/17  9:00 AM  Result Value Ref Range   TSH 2.11 mIU/L    Comment:   Reference Range   > or = 20 Years  0.40-4.50   Pregnancy Range First trimester  0.26-2.66 Second trimester 0.55-2.73 Third trimester  0.43-2.91     Sedimentation rate     Status: None   Collection Time: 02/21/17  9:00 AM  Result Value Ref Range   Sed Rate 4 0 - 30 mm/hr  HIV antibody     Status: None   Collection Time: 02/21/17  9:00 AM  Result Value Ref Range   HIV 1&2 Ab, 4th Generation NONREACTIVE NONREACTIVE    Comment:   HIV-1 antigen and HIV-1/HIV-2 antibodies were not detected.  There is no laboratory evidence of HIV infection.   HIV-1/2 Antibody Diff        Not indicated. HIV-1 RNA, Qual TMA          Not indicated.     PLEASE NOTE: This information has been disclosed to you from records whose confidentiality may be protected by state law. If your state requires such protection, then the state law prohibits you from making any further disclosure of the information without the specific written consent of the person to whom it pertains, or as otherwise permitted by law. A general authorization for the release of medical or other information is  NOT sufficient for this purpose.   The performance of this assay has not been clinically validated in patients less than 6 years old.   For additional information please refer to http://education.quest[facility].com/faq/FAQ106.  (This link is being provided for informational/educational purposes only.)     Urine Culture     Status: None   Collection Time: 03/25/17 11:04 AM  Result Value Ref Range   Organism ID, Bacteria NO GROWTH       PHQ2/9: Depression screen [facility] 2/9 08/21/2016 05/07/2016 12/08/2015 06/13/2015  Decreased Interest 0 0 0 0  Down, Depressed, Hopeless 0 0 0 0  PHQ - 2  Score 0 0 0 0     Fall Risk: Fall Risk  08/21/2016 05/07/2016 12/08/2015 06/13/2015  Falls in the past year? No No Yes Yes  Number falls in past yr: - - 1 1  Injury with Fall? - - No No      Assessment & Plan  1. Decreased exercise tolerance  - Ambulatory referral to Cardiology  2. Need for influenza vaccination  - Flu Vaccine QUAD 6+ mos PF IM (Fluarix Quad PF)  3. History of iron deficiency anemia  Resolved, she went to GI but not due for colonoscopy   4. Cardiac LV ejection fraction of 40-49%  - Ambulatory referral to Cardiology  5. Atherosclerosis of aorta ([place])  - Ambulatory referral to Cardiology  6. Cough  Keep appointment with pulmonologist - has follow up October  7. Other acute sinusitis, recurrence not specified  - loratadine (CLARITIN) 10 MG tablet; Take 1 tablet (10 mg total) by mouth 2 (two) times daily.  Dispense: 60 tablet; Refill: 0  Try saline, may take otc decongestant medication for a couple days and see if it will improve symptoms

## 2017-05-08 ENCOUNTER — Institutional Professional Consult (permissible substitution): Payer: Federal, State, Local not specified - PPO | Admitting: Internal Medicine

## 2017-05-19 ENCOUNTER — Ambulatory Visit: Payer: Federal, State, Local not specified - PPO | Admitting: Family Medicine

## 2017-05-20 ENCOUNTER — Telehealth: Payer: Self-pay | Admitting: *Deleted

## 2017-05-20 NOTE — Telephone Encounter (Signed)
LMTCB patient has appt 05/29/17. Called to see if she would like to move appt up to this week.

## 2017-05-21 ENCOUNTER — Institutional Professional Consult (permissible substitution): Payer: Federal, State, Local not specified - PPO | Admitting: Internal Medicine

## 2017-05-29 ENCOUNTER — Institutional Professional Consult (permissible substitution): Payer: Federal, State, Local not specified - PPO | Admitting: Internal Medicine

## 2017-06-02 ENCOUNTER — Encounter: Payer: Self-pay | Admitting: Internal Medicine

## 2017-06-02 ENCOUNTER — Ambulatory Visit (INDEPENDENT_AMBULATORY_CARE_PROVIDER_SITE_OTHER): Payer: Federal, State, Local not specified - PPO | Admitting: Internal Medicine

## 2017-06-02 VITALS — BP 132/82 | HR 91 | Resp 16 | Ht 71.0 in | Wt 204.0 lb

## 2017-06-02 DIAGNOSIS — J42 Unspecified chronic bronchitis: Secondary | ICD-10-CM

## 2017-06-02 DIAGNOSIS — J4540 Moderate persistent asthma, uncomplicated: Secondary | ICD-10-CM

## 2017-06-02 MED ORDER — BECLOMETHASONE DIPROP HFA 40 MCG/ACT IN AERB: 2.0000 | INHALATION_SPRAY | Freq: Two times a day (BID) | RESPIRATORY_TRACT | 0 refills | Status: DC

## 2017-06-02 NOTE — Patient Instructions (Addendum)
--  Use flonase 2 sprays in each nostril once daily.   --Use generic omeprazole (aka prilosec) 20 mg once daily.   --No eating 4 hours before bedtime.   --No drinking 3 hours before bedtime.   --No pets in the bedroom.   --Qvar 40 2 puffs twice daily, rinse mouth after use, if it helps call us and we will give you a prescription.

## 2017-06-02 NOTE — Progress Notes (Signed)
[facility] Consultation      Assessment and Plan:   66 year old female with cough reflux, sinus drainage.  Cough with sinus drainage and symptomatic gerd.  --start omeprazole 20 mg once daily.  --start flonase daily.  --given sample of qvar 40 2 puffs bid, she is asked to see if cough improves on this regimen.   Chronic bronchitis vs. Asthma vs. COPD.  --There is some hyperinflation seen on imaging, but this may be due to her long torso and height, clinically she has no evidence of emphysema and no dyspnea. She does have mildly elevated eosinophil levels which may be suggestive of asthma-- also she has coughing with laughing.  --Will check a PFT to r/o COPD.  --remove pets from bedroom.    Date: 06/02/2017  MRN# [id number] Melissa [name] [birth year]   Melissa [name] is a 66 y.o. old female seen in consultation for chief complaint of:    Chief Complaint  Patient presents with  . Advice Only    referred by Dr. Ancil Boozer  . Cough    x 5-6 months though she feels it is getting better thick yellow mucus.  . Chest Pain    ocassional  . Shortness of Breath    with exhertion    HPI:   The cough started about 5 months ago, no associated symptoms at that. It is worse with laughter, not worse with activity. No similar problems in the past other than with lisinipril.  She was diagnosed with asthma as a child. She has not been on inhalers in the past.  She has a dog, in bed with her.  She has never been a smoker, her last husband was a smoker.  She denies trouble breathing.  She does have reflux, and occasionally comes up in her mouth, this happens every one or two weeks. She is currently sleeping on 2 pillows and a wedge.  She has a lot of sinus drainage she takes flonase bid but not regularly.   Images personally reviewed; chest x-ray;  hyperinflation consistent with emphysema.  CBC 02/21/17; eosinophils equals 300.  PMHX:   Past Medical History:    Diagnosis Date  . Bruising   . Cervical radiculopathy   . Deficiency of vitamin B   . Depression   . Edema   . Flank pain   . Gross hematuria   . HTN (hypertension)   . Iron deficiency anemia   . Leukopenia   . OA (osteoarthritis)   . Obesity   . Osteopenia   . Perennial allergic rhinitis   . Renal cysts, acquired, bilateral   . RLS (restless legs syndrome)   . Sickle cell trait ([place])   . Snoring   . Spinal stenosis    Surgical Hx:  Past Surgical History:  Procedure Laterality Date  . BARIATRIC SURGERY    . CESAREAN SECTION     3 or more  . GASTRIC BYPASS    . TUBAL LIGATION     Family Hx:  Family History  Problem Relation Age of Onset  . Hypercholesterolemia Mother   . Heart disease Mother   . Hypertension Mother   . Alcohol abuse Father   . Lung cancer Brother   . Alcohol abuse Brother   . Diabetes Mellitus II Sister   . Colon cancer Sister   . Breast cancer Neg Hx    Social Hx:   Social History  Substance Use Topics  . Smoking status: Never Smoker  . Smokeless  tobacco: Never Used  . Alcohol use No   Medication:    Current Outpatient Prescriptions:  .  aspirin EC 81 MG tablet, Take 1 tablet (81 mg total) by mouth daily., Disp: 90 tablet, Rfl: 3 .  atorvastatin (LIPITOR) 40 MG tablet, TAKE 1 TABLET (40 MG TOTAL) BY MOUTH DAILY., Disp: 90 tablet, Rfl: 1 .  Cholecalciferol (VITAMIN D3 ADULT GUMMIES PO), Take 1 each by mouth daily., Disp: , Rfl:  .  fluticasone (FLONASE) 50 MCG/ACT nasal spray, Place 2 sprays into both nostrils as needed. , Disp: , Rfl: 2 .  loratadine (CLARITIN) 10 MG tablet, Take 1 tablet (10 mg total) by mouth 2 (two) times daily. (Patient taking differently: Take 10 mg by mouth daily as needed. ), Disp: 60 tablet, Rfl: 0 .  losartan (COZAAR) 25 MG tablet, Take 1 tablet (25 mg total) by mouth daily., Disp: 90 tablet, Rfl: 1 .  magnesium oxide (MAG-OX) 400 MG tablet, Take 400 mg by mouth daily. Reported on 12/08/2015, Disp: , Rfl:  .   metaxalone (SKELAXIN) 800 MG tablet, Take 1 tablet (800 mg total) by mouth 3 (three) times daily. (Patient taking differently: Take 800 mg by mouth 3 (three) times daily as needed. ), Disp: 90 tablet, Rfl: 0 .  traMADol (ULTRAM) 50 MG tablet, Take 1 tablet (50 mg total) by mouth every 8 (eight) hours as needed., Disp: 90 tablet, Rfl: 0   Allergies:  Contrast media [iodinated diagnostic agents] and Shellfish allergy  Review of Systems: Gen:  Denies  fever, sweats, chills HEENT: Denies blurred vision, double vision. bleeds, sore throat Cvc:  No dizziness, chest pain. Resp:   Denies cough or sputum production, shortness of breath Gi: Denies swallowing difficulty, stomach pain. Gu:  Denies bladder incontinence, burning urine Ext:   No Joint pain, stiffness. Skin: No skin rash,  hives  Endoc:  No polyuria, polydipsia. Psych: No depression, insomnia. Other:  All other systems were reviewed with the patient and were negative other that what is mentioned in the HPI.   Physical Examination:   VS: BP 132/82 (BP Location: Left Arm, Cuff Size: Normal)   Pulse 91   Resp 16   Ht 5\' 11"  (1.803 m)   Wt 204 lb (92.5 kg)   SpO2 100%   BMI 28.45 kg/m   General Appearance: No distress  Neuro:without focal findings,  speech normal,  HEENT: PERRLA, EOM intact.   Pulmonary: normal breath sounds, No wheezing.  CardiovascularNormal S1,S2.  No m/r/g.   Abdomen: Benign, Soft, non-tender. Renal:  No costovertebral tenderness  GU:  No performed at this time. Endoc: No evident thyromegaly, no signs of acromegaly. Skin:   warm, no rashes, no ecchymosis  Extremities: normal, no cyanosis, clubbing.  Other findings:    LABORATORY PANEL:   CBC No results for input(s): WBC, HGB, HCT, PLT in the last 168 hours. ------------------------------------------------------------------------------------------------------------------  Chemistries  No results for input(s): NA, K, CL, CO2, GLUCOSE, BUN,  CREATININE, CALCIUM, MG, AST, ALT, ALKPHOS, BILITOT in the last 168 hours.  Invalid input(s): GFRCGP ------------------------------------------------------------------------------------------------------------------  Cardiac Enzymes No results for input(s): TROPONINI in the last 168 hours. ------------------------------------------------------------  RADIOLOGY:  No results found.     Thank  you for the consultation and for allowing [facility] to assist in the care of your patient. Our recommendations are noted above.  Please contact us if we can be of further service.   Marda Stalker, MD.  Board Certified in Internal Medicine, Pulmonary Medicine,  Critical Care Medicine, and Sleep Medicine.  [facility] Office Number: [phone]  Levaeh Pesa, M.D.  Merton Border, M.D  06/02/2017

## 2017-06-09 ENCOUNTER — Ambulatory Visit (INDEPENDENT_AMBULATORY_CARE_PROVIDER_SITE_OTHER): Payer: Federal, State, Local not specified - PPO | Admitting: Family Medicine

## 2017-06-09 ENCOUNTER — Encounter: Payer: Self-pay | Admitting: Family Medicine

## 2017-06-09 VITALS — BP 132/76 | HR 90 | Temp 97.7°F | Resp 18 | Ht 71.0 in | Wt 205.8 lb

## 2017-06-09 DIAGNOSIS — J302 Other seasonal allergic rhinitis: Secondary | ICD-10-CM

## 2017-06-09 DIAGNOSIS — N183 Chronic kidney disease, stage 3 unspecified: Secondary | ICD-10-CM

## 2017-06-09 DIAGNOSIS — R05 Cough: Secondary | ICD-10-CM | POA: Diagnosis not present

## 2017-06-09 DIAGNOSIS — J3089 Other allergic rhinitis: Secondary | ICD-10-CM | POA: Diagnosis not present

## 2017-06-09 DIAGNOSIS — I700 Atherosclerosis of aorta: Secondary | ICD-10-CM | POA: Diagnosis not present

## 2017-06-09 DIAGNOSIS — R059 Cough, unspecified: Secondary | ICD-10-CM

## 2017-06-09 MED ORDER — ATORVASTATIN CALCIUM 40 MG PO TABS: 40.0000 mg | ORAL_TABLET | Freq: Every day | ORAL | 1 refills | Status: DC

## 2017-06-09 MED ORDER — AZELASTINE-FLUTICASONE 137-50 MCG/ACT NA SUSP: 1.0000 | Freq: Two times a day (BID) | NASAL | 2 refills | Status: DC

## 2017-06-09 NOTE — Progress Notes (Signed)
Name: Melissa [name]   MRN: [id number]    DOB: [birth year]   Date:06/09/2017       Progress Note  Subjective  Chief Complaint  Chief Complaint  Patient presents with  . Cough    Went to see Pulmonologist at [facility] and was given Qvar and told to take Prilosec otc. Going to take a test to determine if she has COPD    HPI  Cough/postnasal drainage: states sob is not constant, it is occasional and mild. Seeing pulmonologist and is on Prilosec and also Qvar, still coughing, usually dry cough, seems worse during the day, does not wake her up at night. She has a lot of post-nasal drainage and tickle on the back of her throat. No change in appetite. Only occasionally has nasal congestion. She has more testing upcoming with pulmonologist. We will change from flonase to dymista in the mean time  Chest pain: stable, on right side, dull ache on the right side and radiates to her back intermittently, not associated with SOB or palpitation  CKI stage III: seeing Dr. Abigail Butts.and she will have an US done soon   Atherosclerosis: taking Atorvastatin, denies side effects  Patient Active Problem List   Diagnosis Date Noted  . Cardiac LV ejection fraction of 40-49% 05/07/2017  . Low back pain without sciatica 02/21/2017  . Renal cyst 10/18/2016  . Atherosclerosis of aorta ([place]) 10/16/2016  . History of colonic polyps 09/25/2016  . Dyslipidemia 12/08/2015  . Gastroesophageal reflux disease without esophagitis 08/11/2015  . Chronic kidney disease (CKD), stage III (moderate) ([place]) 07/02/2015  . Primary osteoarthritis of knee 06/13/2015  . Hip bursitis 06/13/2015  . RLS (restless legs syndrome) 06/13/2015  . Depression, major, in remission ([place]) 06/13/2015  . B12 deficiency 06/13/2015  . History of Roux-en-Y gastric bypass 06/13/2015  . History of iron deficiency anemia 06/13/2015  . Vitamin D deficiency 06/13/2015  . Hypertension, benign 06/13/2015  . Perennial allergic rhinitis with seasonal  variation 06/13/2015  . Obesity (BMI 30.0-34.9) 06/13/2015  . Cervical radiculitis 01/04/2014  . Cervical spinal stenosis 01/04/2014  . Cervical osteoarthritis 01/04/2014    Past Surgical History:  Procedure Laterality Date  . BARIATRIC SURGERY    . CESAREAN SECTION     3 or more  . GASTRIC BYPASS    . TUBAL LIGATION      Family History  Problem Relation Age of Onset  . Hypercholesterolemia Mother   . Heart disease Mother   . Hypertension Mother   . Alcohol abuse Father   . Lung cancer Brother   . Alcohol abuse Brother   . Diabetes Mellitus II Sister   . Colon cancer Sister   . Breast cancer Neg Hx     Social History   Social History  . Marital status: Divorced    Spouse name: N/A  . Number of children: N/A  . Years of education: N/A   Occupational History  . Not on file.   Social History Main Topics  . Smoking status: Never Smoker  . Smokeless tobacco: Never Used  . Alcohol use No  . Drug use: No  . Sexual activity: Not Currently   Other Topics Concern  . Not on file   Social History Narrative  . No narrative on file     Current Outpatient Prescriptions:  .  aspirin EC 81 MG tablet, Take 1 tablet (81 mg total) by mouth daily., Disp: 90 tablet, Rfl: 3 .  atorvastatin (LIPITOR) 40 MG tablet, TAKE 1 TABLET (  40 MG TOTAL) BY MOUTH DAILY., Disp: 90 tablet, Rfl: 1 .  beclomethasone (QVAR REDIHALER) 40 MCG/ACT inhaler, Inhale 2 puffs into the lungs 2 (two) times daily., Disp: 1 Inhaler, Rfl: 0 .  Cholecalciferol (VITAMIN D3 ADULT GUMMIES PO), Take 1 each by mouth daily., Disp: , Rfl:  .  loratadine (CLARITIN) 10 MG tablet, Take 1 tablet (10 mg total) by mouth 2 (two) times daily. (Patient taking differently: Take 10 mg by mouth daily as needed. ), Disp: 60 tablet, Rfl: 0 .  losartan (COZAAR) 25 MG tablet, Take 1 tablet (25 mg total) by mouth daily., Disp: 90 tablet, Rfl: 1 .  magnesium oxide (MAG-OX) 400 MG tablet, Take 400 mg by mouth daily. Reported on  12/08/2015, Disp: , Rfl:  .  omeprazole (PRILOSEC) 20 MG capsule, Take 20 mg by mouth 2 (two) times daily before a meal., Disp: , Rfl:  .  traMADol (ULTRAM) 50 MG tablet, Take 1 tablet (50 mg total) by mouth every 8 (eight) hours as needed., Disp: 90 tablet, Rfl: 0 .  Azelastine-Fluticasone (DYMISTA) 137-50 MCG/ACT SUSP, Place 1 spray into the nose 2 (two) times daily., Disp: 23 g, Rfl: 2 .  metaxalone (SKELAXIN) 800 MG tablet, Take 1 tablet (800 mg total) by mouth 3 (three) times daily. (Patient not taking: Reported on 06/09/2017), Disp: 90 tablet, Rfl: 0  Allergies  Allergen Reactions  . Contrast Media [Iodinated Diagnostic Agents]   . Shellfish Allergy     Edema     ROS  Constitutional: Negative for fever or weight change.  Respiratory: Positive  for cough and occasional shortness of breath.   Cardiovascular: still has almost constant  chest pain but no palpitations.  Gastrointestinal: Negative for abdominal pain, no bowel changes.  Musculoskeletal: Negative for gait problem or joint swelling.  Skin: Negative for rash.  Neurological: Negative for dizziness or headache.  No other specific complaints in a complete review of systems (except as listed in HPI above).  Objective  Vitals:   06/09/17 1422  BP: 132/76  Pulse: 90  Resp: 18  Temp: 97.7 F (36.5 C)  TempSrc: Oral  SpO2: 97%  Weight: 205 lb 12.8 oz (93.4 kg)  Height: 5\' 11"  (1.803 m)    Body mass index is 28.7 kg/m.  Physical Exam  Constitutional: Patient appears well-developed and well-nourished. Obese  No distress.  HEENT: head atraumatic, normocephalic, pupils equal and reactive to light,  neck supple, throat within normal limits Cardiovascular: Normal rate, regular rhythm and normal heart sounds.  No murmur heard. No BLE edema. Pulmonary/Chest: Effort normal and breath sounds normal. No respiratory distress. Abdominal: Soft.  There is no tenderness. Psychiatric: Patient has a normal mood and affect.  behavior is normal. Judgment and thought content normal.  Recent Results (from the past 2160 hour(s))  Urine Culture     Status: None   Collection Time: 03/25/17 11:04 AM  Result Value Ref Range   Organism ID, Bacteria NO GROWTH      PHQ2/9: Depression screen [facility] 2/9 06/09/2017 08/21/2016 05/07/2016 12/08/2015 06/13/2015  Decreased Interest 0 0 0 0 0  Down, Depressed, Hopeless 0 0 0 0 0  PHQ - 2 Score 0 0 0 0 0     Fall Risk: Fall Risk  06/09/2017 08/21/2016 05/07/2016 12/08/2015 06/13/2015  Falls in the past year? No No No Yes Yes  Number falls in past yr: - - - 1 1  Injury with Fall? - - - No No     Functional  Status Survey: Is the patient deaf or have difficulty hearing?: No Does the patient have difficulty seeing, even when wearing glasses/contacts?: No Does the patient have difficulty concentrating, remembering, or making decisions?: No Does the patient have difficulty walking or climbing stairs?: No Does the patient have difficulty dressing or bathing?: No Does the patient have difficulty doing errands alone such as visiting a doctor's office or shopping?: No    Assessment & Plan  1. Cough  Continue follow up with pulmonologist and Qvar plus PPI, we will add Dymista since she has a lot post-nasal drainage and tickle sensation on the back of her throat  2. Chronic kidney disease (CKD), stage III (moderate) (HCC)  Having kidney US done by Dr. Abigail Butts  3. Perennial allergic rhinitis with seasonal variation  - Azelastine-Fluticasone (DYMISTA) 137-50 MCG/ACT SUSP; Place 1 spray into the nose 2 (two) times daily.  Dispense: 23 g; Refill: 2  4. Atherosclerosis of aorta (HCC)  - atorvastatin (LIPITOR) 40 MG tablet; Take 1 tablet (40 mg total) by mouth daily.  Dispense: 90 tablet; Refill: 1

## 2017-06-16 ENCOUNTER — Other Ambulatory Visit: Payer: Self-pay | Admitting: Family Medicine

## 2017-06-16 DIAGNOSIS — J302 Other seasonal allergic rhinitis: Secondary | ICD-10-CM

## 2017-06-16 DIAGNOSIS — J3089 Other allergic rhinitis: Secondary | ICD-10-CM

## 2017-06-16 NOTE — Telephone Encounter (Signed)
Refill request for general medication:  Last office visit: 05/07/2017  Last physical exam:  08/21/2016  Follow up: none indicated

## 2017-06-16 NOTE — Telephone Encounter (Signed)
Copied from [place] #[id number]. Topic: Inquiry >> Jun 16, 2017 12:50 PM Conception Chancy, NT wrote: Reason for CRM: pt is needing RX for nasal spray, flonaze.

## 2017-06-18 MED ORDER — AZELASTINE-FLUTICASONE 137-50 MCG/ACT NA SUSP: 1.0000 | Freq: Two times a day (BID) | NASAL | 2 refills | Status: DC

## 2017-06-27 ENCOUNTER — Ambulatory Visit: Payer: Federal, State, Local not specified - PPO | Admitting: Family Medicine

## 2017-07-01 NOTE — Progress Notes (Signed)
Cardiology Office Note  Date:  07/02/2017   ID:  Melissa [name], DOB [birth year], MRN [id number]  PCP:  Steele Sizer, MD   Chief Complaint  Patient presents with  . other    Referred by PCP for Decreased exercise tolerance and SOB.  Formal Ingal patient. Meds reviewed verbally with patient.     HPI:  Melissa [name] is a 66 year old woman with past medical history of Atherosclerosis of Aorta seen on CT scan 2018:  hyperlipidemia CRI, likely secondary to polycystic kidney Depression Obesity, Roux-en-Y gastric bypass Back pain Who presents for routine follow-up of her aortic atherosclerosis  In follow-up today she reports that she is doing relatively well Recent problems with bronchitis and cough Referred to pulmonary who felt she had bronchitis versus asthma versus COPD Patient denies any prior smoking history Reports that her symptoms have dramatically improved Previously had some cough when she was laughing but now improved  Otherwise denies any significant chest pain Active with no complaints  CT scan abdomen pelvis reviewed with her in detail showing mild aortic atherosclerosis extending into the common iliac arteries And kidney stone noted  EKG personally reviewed by myself on todays visit Shows normal sinus rhythm with rate 100 bpm no significant ST or T wave changes  PMH:   has a past medical history of Bruising, Cervical radiculopathy, Deficiency of vitamin B, Depression, Edema, Flank pain, Gross hematuria, HTN (hypertension), Iron deficiency anemia, Leukopenia, OA (osteoarthritis), Obesity, Osteopenia, Perennial allergic rhinitis, Renal cysts, acquired, bilateral, RLS (restless legs syndrome), Sickle cell trait ([place]), Snoring, and Spinal stenosis.  PSH:    Past Surgical History:  Procedure Laterality Date  . BARIATRIC SURGERY    . CESAREAN SECTION     3 or more  . GASTRIC BYPASS    . TUBAL LIGATION      Current Outpatient Medications  Medication Sig  Dispense Refill  . aspirin EC 81 MG tablet Take 1 tablet (81 mg total) by mouth daily. 90 tablet 3  . atorvastatin (LIPITOR) 40 MG tablet Take 1 tablet (40 mg total) by mouth daily. 90 tablet 1  . Azelastine-Fluticasone (DYMISTA) 137-50 MCG/ACT SUSP Place 1 spray into the nose 2 (two) times daily. 23 g 2  . Cholecalciferol (VITAMIN D3 ADULT GUMMIES PO) Take 1 each by mouth daily.    Marland Kitchen loratadine (CLARITIN) 10 MG tablet Take 1 tablet (10 mg total) by mouth 2 (two) times daily. (Patient taking differently: Take 10 mg by mouth daily as needed. ) 60 tablet 0  . losartan (COZAAR) 25 MG tablet Take 1 tablet (25 mg total) by mouth daily. 90 tablet 1  . magnesium oxide (MAG-OX) 400 MG tablet Take 400 mg by mouth daily. Reported on 12/08/2015    . metaxalone (SKELAXIN) 800 MG tablet Take 1 tablet (800 mg total) by mouth 3 (three) times daily. 90 tablet 0  . omeprazole (PRILOSEC) 20 MG capsule Take 20 mg by mouth 2 (two) times daily before a meal.    . traMADol (ULTRAM) 50 MG tablet Take 1 tablet (50 mg total) by mouth every 8 (eight) hours as needed. 90 tablet 0  . beclomethasone (QVAR REDIHALER) 40 MCG/ACT inhaler Inhale 2 puffs 2 (two) times daily into the lungs. 1 Inhaler 3   No current facility-administered medications for this visit.      Allergies:   Contrast media [iodinated diagnostic agents] and Shellfish allergy   Social History:  The patient  reports that  has never smoked. she has never used  smokeless tobacco. She reports that she does not drink alcohol or use drugs.   Family History:   family history includes Alcohol abuse in her brother and father; Colon cancer in her sister; Diabetes Mellitus II in her sister; Heart disease in her mother; Hypercholesterolemia in her mother; Hypertension in her mother; Lung cancer in her brother.    Review of Systems: Review of Systems  Constitutional: Negative.   Respiratory: Negative.   Cardiovascular: Negative.   Gastrointestinal: Negative.    Musculoskeletal: Negative.   Neurological: Negative.   Psychiatric/Behavioral: Negative.   All other systems reviewed and are negative.    PHYSICAL EXAM: VS:  BP 128/72 (BP Location: Left Arm, Patient Position: Sitting, Cuff Size: Normal)   Pulse 100   Ht 6' (1.829 m)   Wt 208 lb (94.3 kg)   BMI 28.21 kg/m  , BMI Body mass index is 28.21 kg/m. GEN: Well nourished, well developed, in no acute distress  HEENT: normal  Neck: no JVD, carotid bruits, or masses Cardiac: RRR; no murmurs, rubs, or gallops,no edema  Respiratory:  clear to auscultation bilaterally, normal work of breathing GI: soft, nontender, nondistended, + BS MS: no deformity or atrophy  Skin: warm and dry, no rash Neuro:  Strength and sensation are intact Psych: euthymic mood, full affect    Recent Labs: 02/21/2017: ALT 11; BUN 22; Creat 1.55; Hemoglobin 12.6; Platelets 222; Potassium 4.8; Sodium 141; TSH 2.11    Lipid Panel Lab Results  Component Value Date   CHOL 136 10/17/2016   HDL 80 10/17/2016   LDLCALC 39 10/17/2016   TRIG 85 10/17/2016      Wt Readings from Last 3 Encounters:  07/02/17 208 lb (94.3 kg)  06/09/17 205 lb 12.8 oz (93.4 kg)  06/02/17 204 lb (92.5 kg)       ASSESSMENT AND PLAN:  Atherosclerosis of aorta (HCC) - Plan: EKG 12-Lead Mild atherosclerosis noted, images pulled up and reviewed in the office Stressed importance of staying on her cholesterol medication, She is a non-smoker Nondiabetic Risk factors are essentially perfect  Hypertension, benign Blood pressure is well controlled on today's visit. No changes made to the medications.  Dyslipidemia Cholesterol is at goal on the current lipid regimen. No changes to the medications were made.  Chronic kidney disease (CKD), stage III (moderate) (HCC) Creatinine stable 1.55, likely polycystic disease Scheduled to see nephrology Recommended she avoid NSAIDs  Cough Likely secondary to resolving bronchitis, postnasal  drip She feels her symptoms have improved now back to baseline, no prior smoking history Less likely COPD Lungs clear on auscultation   Total encounter time more than 25 minutes  Greater than 50% was spent in counseling and coordination of care with the patient   Disposition:   F/U  12 months   Orders Placed This Encounter  Procedures  . EKG 12-Lead     Signed, Esmond Plants, M.D., Ph.D. 07/02/2017  [place], [place]

## 2017-07-02 ENCOUNTER — Ambulatory Visit: Payer: Federal, State, Local not specified - PPO | Admitting: Cardiovascular Disease

## 2017-07-02 ENCOUNTER — Telehealth: Payer: Self-pay | Admitting: Internal Medicine

## 2017-07-02 ENCOUNTER — Ambulatory Visit
Admission: RE | Admit: 2017-07-02 | Discharge: 2017-07-02 | Disposition: A | Discharge status: Home / Self Care | Payer: Federal, State, Local not specified - PPO | Source: Ambulatory Visit | Attending: Nephrology | Admitting: Nephrology

## 2017-07-02 ENCOUNTER — Encounter: Payer: Self-pay | Admitting: Cardiovascular Disease

## 2017-07-02 VITALS — BP 128/72 | HR 100 | Ht 72.0 in | Wt 208.0 lb

## 2017-07-02 DIAGNOSIS — N183 Chronic kidney disease, stage 3 unspecified: Secondary | ICD-10-CM

## 2017-07-02 DIAGNOSIS — E785 Hyperlipidemia, unspecified: Secondary | ICD-10-CM | POA: Diagnosis not present

## 2017-07-02 DIAGNOSIS — N281 Cyst of kidney, acquired: Secondary | ICD-10-CM

## 2017-07-02 DIAGNOSIS — I700 Atherosclerosis of aorta: Secondary | ICD-10-CM

## 2017-07-02 DIAGNOSIS — R319 Hematuria, unspecified: Secondary | ICD-10-CM | POA: Diagnosis not present

## 2017-07-02 DIAGNOSIS — I10 Essential (primary) hypertension: Secondary | ICD-10-CM

## 2017-07-02 MED ORDER — BECLOMETHASONE DIPROP HFA 40 MCG/ACT IN AERB: 2.0000 | INHALATION_SPRAY | Freq: Two times a day (BID) | RESPIRATORY_TRACT | 3 refills | Status: DC

## 2017-07-02 NOTE — Patient Instructions (Signed)

## 2017-07-02 NOTE — Telephone Encounter (Signed)
rx sent.ss

## 2017-07-02 NOTE — Telephone Encounter (Signed)
°*  STAT* If patient is at the pharmacy, call can be transferred to refill team.   1. Which medications need to be refilled? (please list name of each medication and dose if known) beclomethasone (QVAR REDIHALER)   2. Which pharmacy/location (including street and city if local pharmacy) is medication to be sent to? [facility] on [address] (not the one in [employer])  3. Do they need a 30 day or 90 day supply?

## 2017-07-08 ENCOUNTER — Ambulatory Visit: Payer: Federal, State, Local not specified - PPO | Attending: Internal Medicine

## 2017-07-14 ENCOUNTER — Ambulatory Visit: Payer: Federal, State, Local not specified - PPO | Admitting: Internal Medicine

## 2017-07-25 ENCOUNTER — Encounter: Payer: Self-pay | Admitting: Family Medicine

## 2017-07-25 ENCOUNTER — Ambulatory Visit: Payer: Federal, State, Local not specified - PPO | Admitting: Family Medicine

## 2017-07-25 VITALS — BP 130/83 | HR 69 | Wt 209.0 lb

## 2017-07-25 DIAGNOSIS — R05 Cough: Secondary | ICD-10-CM | POA: Diagnosis not present

## 2017-07-25 DIAGNOSIS — M5412 Radiculopathy, cervical region: Secondary | ICD-10-CM | POA: Diagnosis not present

## 2017-07-25 DIAGNOSIS — F331 Major depressive disorder, recurrent, moderate: Secondary | ICD-10-CM | POA: Diagnosis not present

## 2017-07-25 DIAGNOSIS — R059 Cough, unspecified: Secondary | ICD-10-CM

## 2017-07-25 NOTE — Progress Notes (Signed)
Name: Melissa [name]   MRN: [id number]    DOB: [birth year]   Date:07/25/2017       Progress Note  Subjective  Chief Complaint  Chief Complaint  Patient presents with  . Shoulder Pain    right  . 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 She has difficulty saying goodbye, and afraid that if she goes back to work she will not retire she will post-pone her retirement again ( she had everything set up for last year - but kept working). It is causing her to eat all the time, worries about what is to come, mind is busy, but no problems sleeping except for her right shoulder.   Neck pain: she has a long history of radiculitis, she had injections in the past, but never head surgery, having flares again, pain is radiating down right shoulder, no weakness or numbness, just pain. Pain is described as throbbing, constant, right now 4/10, but can go up to 8/10  SOB/cough: seeing pulmonologist and cardiologist, still has a cough, dry now but occasionally productive, SOB has resolved.   Patient Active Problem List   Diagnosis Date Noted  . Cardiac LV ejection fraction of 40-49% 05/07/2017  . Low back pain without sciatica 02/21/2017  . Renal cyst 10/18/2016  . Atherosclerosis of aorta ([place]) 10/16/2016  . History of colonic polyps 09/25/2016  . Dyslipidemia 12/08/2015  . Gastroesophageal reflux disease without esophagitis 08/11/2015  . Chronic kidney disease (CKD), stage III (moderate) ([place]) 07/02/2015  . Primary osteoarthritis of knee 06/13/2015  . Hip bursitis 06/13/2015  . RLS (restless legs syndrome) 06/13/2015  . Depression, major, in remission ([place]) 06/13/2015  . B12 deficiency 06/13/2015  . History of  Roux-en-Y gastric bypass 06/13/2015  . History of iron deficiency anemia 06/13/2015  . Vitamin D deficiency 06/13/2015  . Hypertension, benign 06/13/2015  . Perennial allergic rhinitis with seasonal variation 06/13/2015  . Obesity (BMI 30.0-34.9) 06/13/2015  . Cervical radiculitis 01/04/2014  . Cervical spinal stenosis 01/04/2014  . Cervical osteoarthritis 01/04/2014    Past Surgical History:  Procedure Laterality Date  . BARIATRIC SURGERY    . CESAREAN SECTION     3 or more  . GASTRIC BYPASS    . TUBAL LIGATION      Family History  Problem Relation Age of Onset  . Hypercholesterolemia Mother   . Heart disease Mother   . Hypertension Mother   . Alcohol abuse Father   . Lung cancer Brother   . Alcohol abuse Brother   . Diabetes Mellitus II Sister   . Colon cancer Sister   . Breast cancer Neg Hx     Social History   Socioeconomic History  . Marital status: Divorced    Spouse name: Not on file  . Number of children: 4  . Years of education: Not on file  . Highest education level: Not on file  Social Needs  . Financial resource strain: Not on file  . Food insecurity - worry: Not on file  . Food insecurity - inability: Not on file  . Transportation needs - medical: Not on file  . Transportation needs - non-medical: Not on file  Occupational History  . Not  on file  Tobacco Use  . Smoking status: Never Smoker  . Smokeless tobacco: Never Used  Substance and Sexual Activity  . Alcohol use: No    Alcohol/week: 0.0 oz  . Drug use: No  . Sexual activity: Yes  Other Topics Concern  . Not on file  Social History Narrative  . Not on file     Current Outpatient Medications:  .  aspirin EC 81 MG tablet, Take 1 tablet (81 mg total) by mouth daily., Disp: 90 tablet, Rfl: 3 .  atorvastatin (LIPITOR) 40 MG tablet, Take 1 tablet (40 mg total) by mouth daily., Disp: 90 tablet, Rfl: 1 .  Azelastine-Fluticasone (DYMISTA) 137-50 MCG/ACT SUSP, Place 1 spray into the nose 2  (two) times daily., Disp: 23 g, Rfl: 2 .  beclomethasone (QVAR REDIHALER) 40 MCG/ACT inhaler, Inhale 2 puffs 2 (two) times daily into the lungs., Disp: 1 Inhaler, Rfl: 3 .  Cholecalciferol (VITAMIN D3 ADULT GUMMIES PO), Take 1 each by mouth daily., Disp: , Rfl:  .  HYDROcodone-acetaminophen (NORCO/VICODIN) 5-325 MG tablet, Take 1 tablet by mouth 2 (two) times daily as needed., Disp: , Rfl: 0 .  loratadine (CLARITIN) 10 MG tablet, Take 1 tablet (10 mg total) by mouth 2 (two) times daily. (Patient taking differently: Take 10 mg by mouth daily as needed. ), Disp: 60 tablet, Rfl: 0 .  losartan (COZAAR) 25 MG tablet, Take 1 tablet (25 mg total) by mouth daily., Disp: 90 tablet, Rfl: 1 .  magnesium oxide (MAG-OX) 400 MG tablet, Take 400 mg by mouth daily. Reported on 12/08/2015, Disp: , Rfl:  .  metaxalone (SKELAXIN) 800 MG tablet, Take 1 tablet (800 mg total) by mouth 3 (three) times daily., Disp: 90 tablet, Rfl: 0 .  omeprazole (PRILOSEC) 20 MG capsule, Take 20 mg by mouth 2 (two) times daily before a meal., Disp: , Rfl:  .  traMADol (ULTRAM) 50 MG tablet, Take 1 tablet (50 mg total) by mouth every 8 (eight) hours as needed., Disp: 90 tablet, Rfl: 0  Allergies  Allergen Reactions  . Contrast Media [Iodinated Diagnostic Agents]   . Shellfish Allergy     Edema     ROS  Constitutional: Negative for fever , positive for  weight change.  Respiratory: Positive  for cough but no longer has  shortness of breath - seeing pulmonologist    Cardiovascular: Negative for chest pain or palpitations.  Gastrointestinal: Negative for abdominal pain, no bowel changes.  Musculoskeletal: Negative for gait problem or joint swelling.  Skin: Negative for rash.  Neurological: Negative for dizziness or headache.  No other specific complaints in a complete review of systems (except as listed in HPI above).  Objective  Vitals:   07/25/17 0803  BP: 130/83  Pulse: 69  SpO2: 99%  Weight: 209 lb (94.8 kg)    Body  mass index is 28.35 kg/m.  Physical Exam  Constitutional: Patient appears well-developed and well-nourished. Overweight. No distress.  HEENT: head atraumatic, normocephalic, pupils equal and reactive to light, decrease in rom of neck -pain radiates to right shoulder. throat within normal limits Cardiovascular: Normal rate, regular rhythm and normal heart sounds.  No murmur heard. No BLE edema. Pulmonary/Chest: Effort normal and breath sounds normal. No respiratory distress. Abdominal: Soft.  There is no tenderness. Psychiatric: Patient has a depressed mood, crying,  behavior is normal. Judgment and thought content normal. Muscular skeletal: normal grip, normal rom of shoulder   PHQ2/9: Depression screen [facility] 2/9 07/25/2017 06/09/2017 08/21/2016 05/07/2016 12/08/2015  Decreased Interest 0 0 0 0 0  Down, Depressed, Hopeless 1 0 0 0 0  PHQ - 2 Score 1 0 0 0 0  Altered sleeping 1 - - - -  Tired, decreased energy 1 - - - -  Change in appetite 3 - - - -  Feeling bad or failure about yourself  3 - - - -  Trouble concentrating 0 - - - -  Moving slowly or fidgety/restless 0 - - - -  Suicidal thoughts 0 - - - -  PHQ-9 Score 9 - - - -  Difficult doing work/chores Somewhat difficult - - - -    Fall Risk: Fall Risk  07/25/2017 06/09/2017 08/21/2016 05/07/2016 12/08/2015  Falls in the past year? No No No No Yes  Number falls in past yr: - - - - 1  Injury with Fall? - - - - No    Functional Status Survey: Is the patient deaf or have difficulty hearing?: No Does the patient have difficulty seeing, even when wearing glasses/contacts?: No Does the patient have difficulty concentrating, remembering, or making decisions?: No Does the patient have difficulty walking or climbing stairs?: No Does the patient have difficulty dressing or bathing?: No Does the patient have difficulty doing errands alone such as visiting a doctor's office or shopping?: No   Assessment & Plan  1. Depression, major,  recurrent, moderate ([place])  - Ambulatory referral to Psychology Excuse for work for one month, and at this time she prefers not staring medication  2. Cervical radiculitis  Keep follow up with Dr. Phyllis Ginger.   3. Cough  Keep follow up with cardiologist and pneumologist

## 2017-08-04 NOTE — Progress Notes (Deleted)
[facility] Consultation      Assessment and Plan:   66 year old female with cough, reflux, sinus drainage.  Cough with sinus drainage and symptomatic gerd.  --start omeprazole 20 mg once daily.  --start flonase daily.  --given sample of qvar 40 2 puffs bid, she is asked to see if cough improves on this regimen.   Chronic bronchitis vs. Asthma vs. COPD.  --There is some hyperinflation seen on imaging, but this may be due to her long torso and height, clinically she has no evidence of emphysema and no dyspnea. She does have mildly elevated eosinophil levels which may be suggestive of asthma-- also she has coughing with laughing.  --Will check a PFT to r/o COPD.  --remove pets from bedroom.    Date: 08/04/2017  MRN# [id number] Melissa [name] 1951-08-03   Melissa [name] is a 66 y.o. old female seen in consultation for chief complaint of:    No chief complaint on file.   HPI:   At last visit he was noted that the patient had a persistent cough of 5 months, no associated symptoms, and made worse by laughter.  She was noted to have sinus drainage of symptomatic GERD, start on omeprazole 20 mg once daily, and given a sample of Qvar to see if it helped.  She was sent for PFT to rule out COPD, and asked to remove pets from the bedroom.  She has a dog, in bed with her.  She has never been a smoker, her last husband was a smoker.  She denies trouble breathing.  She does have reflux, and occasionally comes up in her mouth, this happens every one or two weeks. She is currently sleeping on 2 pillows and a wedge.  She has a lot of sinus drainage she takes flonase bid but not regularly.   Images personally reviewed; chest x-ray;  hyperinflation consistent with emphysema.  CBC 02/21/17; eosinophils equals 300.  PMHX:   Past Medical History:  Diagnosis Date  . Bruising   . Cervical radiculopathy   . Deficiency of vitamin B   . Depression   . Edema   . Flank  pain   . Gross hematuria   . HTN (hypertension)   . Iron deficiency anemia   . Leukopenia   . OA (osteoarthritis)   . Obesity   . Osteopenia   . Perennial allergic rhinitis   . Renal cysts, acquired, bilateral   . RLS (restless legs syndrome)   . Sickle cell trait ([place])   . Snoring   . Spinal stenosis    Surgical Hx:  Past Surgical History:  Procedure Laterality Date  . BARIATRIC SURGERY    . CESAREAN SECTION     3 or more  . GASTRIC BYPASS    . TUBAL LIGATION     Family Hx:  Family History  Problem Relation Age of Onset  . Hypercholesterolemia Mother   . Heart disease Mother   . Hypertension Mother   . Alcohol abuse Father   . Lung cancer Brother   . Alcohol abuse Brother   . Diabetes Mellitus II Sister   . Colon cancer Sister   . Breast cancer Neg Hx    Social Hx:   Social History   Tobacco Use  . Smoking status: Never Smoker  . Smokeless tobacco: Never Used  Substance Use Topics  . Alcohol use: No    Alcohol/week: 0.0 oz  . Drug use: No   Medication:  Current Outpatient Medications:  .  aspirin EC 81 MG tablet, Take 1 tablet (81 mg total) by mouth daily., Disp: 90 tablet, Rfl: 3 .  atorvastatin (LIPITOR) 40 MG tablet, Take 1 tablet (40 mg total) by mouth daily., Disp: 90 tablet, Rfl: 1 .  Azelastine-Fluticasone (DYMISTA) 137-50 MCG/ACT SUSP, Place 1 spray into the nose 2 (two) times daily., Disp: 23 g, Rfl: 2 .  beclomethasone (QVAR REDIHALER) 40 MCG/ACT inhaler, Inhale 2 puffs 2 (two) times daily into the lungs., Disp: 1 Inhaler, Rfl: 3 .  Cholecalciferol (VITAMIN D3 ADULT GUMMIES PO), Take 1 each by mouth daily., Disp: , Rfl:  .  HYDROcodone-acetaminophen (NORCO/VICODIN) 5-325 MG tablet, Take 1 tablet by mouth 2 (two) times daily as needed., Disp: , Rfl: 0 .  loratadine (CLARITIN) 10 MG tablet, Take 1 tablet (10 mg total) by mouth 2 (two) times daily. (Patient taking differently: Take 10 mg by mouth daily as needed. ), Disp: 60 tablet, Rfl: 0 .   losartan (COZAAR) 25 MG tablet, Take 1 tablet (25 mg total) by mouth daily., Disp: 90 tablet, Rfl: 1 .  magnesium oxide (MAG-OX) 400 MG tablet, Take 400 mg by mouth daily. Reported on 12/08/2015, Disp: , Rfl:  .  metaxalone (SKELAXIN) 800 MG tablet, Take 1 tablet (800 mg total) by mouth 3 (three) times daily., Disp: 90 tablet, Rfl: 0 .  omeprazole (PRILOSEC) 20 MG capsule, Take 20 mg by mouth 2 (two) times daily before a meal., Disp: , Rfl:  .  traMADol (ULTRAM) 50 MG tablet, Take 1 tablet (50 mg total) by mouth every 8 (eight) hours as needed., Disp: 90 tablet, Rfl: 0   Allergies:  Contrast media [iodinated diagnostic agents] and Shellfish allergy  Review of Systems: Gen:  Denies  fever, sweats, chills HEENT: Denies blurred vision, double vision. bleeds, sore throat Cvc:  No dizziness, chest pain. Resp:   Denies cough or sputum production, shortness of breath Gi: Denies swallowing difficulty, stomach pain. Gu:  Denies bladder incontinence, burning urine Ext:   No Joint pain, stiffness. Skin: No skin rash,  hives  Endoc:  No polyuria, polydipsia. Psych: No depression, insomnia. Other:  All other systems were reviewed with the patient and were negative other that what is mentioned in the HPI.   Physical Examination:   VS: There were no vitals taken for this visit.  General Appearance: No distress  Neuro:without focal findings,  speech normal,  HEENT: PERRLA, EOM intact.   Pulmonary: normal breath sounds, No wheezing.  CardiovascularNormal S1,S2.  No m/r/g.   Abdomen: Benign, Soft, non-tender. Renal:  No costovertebral tenderness  GU:  No performed at this time. Endoc: No evident thyromegaly, no signs of acromegaly. Skin:   warm, no rashes, no ecchymosis  Extremities: normal, no cyanosis, clubbing.  Other findings:    LABORATORY PANEL:   CBC No results for input(s): WBC, HGB, HCT, PLT in the last 168  hours. ------------------------------------------------------------------------------------------------------------------  Chemistries  No results for input(s): NA, K, CL, CO2, GLUCOSE, BUN, CREATININE, CALCIUM, MG, AST, ALT, ALKPHOS, BILITOT in the last 168 hours.  Invalid input(s): GFRCGP ------------------------------------------------------------------------------------------------------------------  Cardiac Enzymes No results for input(s): TROPONINI in the last 168 hours. ------------------------------------------------------------  RADIOLOGY:  No results found.     Thank  you for the consultation and for allowing [facility] to assist in the care of your patient. Our recommendations are noted above.  Please contact us if we can be of further service.   Marda Stalker, MD.  Board Certified in Internal Medicine, Pulmonary Medicine, [place], and Sleep Medicine.  [facility] Office Number: [phone]  Annalyssa Pesa, M.D.  Merton Border, M.D  08/04/2017

## 2017-08-05 ENCOUNTER — Ambulatory Visit: Payer: Federal, State, Local not specified - PPO | Admitting: Internal Medicine

## 2017-08-25 ENCOUNTER — Ambulatory Visit: Payer: Federal, State, Local not specified - PPO | Admitting: Internal Medicine

## 2017-08-25 ENCOUNTER — Ambulatory Visit: Payer: Federal, State, Local not specified - PPO | Admitting: Family Medicine

## 2017-09-29 ENCOUNTER — Telehealth: Payer: Self-pay

## 2017-09-29 DIAGNOSIS — I10 Essential (primary) hypertension: Secondary | ICD-10-CM

## 2017-09-29 MED ORDER — LOSARTAN POTASSIUM 25 MG PO TABS: 25.0000 mg | ORAL_TABLET | Freq: Every day | ORAL | 0 refills | Status: DC

## 2017-09-29 NOTE — Telephone Encounter (Signed)
Refill request for Hypertension medication:  Losartan Potassium 25 mg  Last office visit pertaining to hypertension: 07/02/2017  BP Readings from Last 3 Encounters:  07/25/17 130/83  07/02/17 128/72  06/09/17 132/76     Lab Results  Component Value Date   CREATININE 1.55 (H) 02/21/2017   BUN 22 02/21/2017   NA 141 02/21/2017   K 4.8 02/21/2017   CL 107 02/21/2017   CO2 24 02/21/2017   Follow-up on file.  None indicated

## 2017-09-29 NOTE — Telephone Encounter (Signed)
appt made for April

## 2017-11-18 ENCOUNTER — Ambulatory Visit: Payer: Federal, State, Local not specified - PPO | Admitting: Family Medicine

## 2017-11-18 ENCOUNTER — Encounter: Payer: Self-pay | Admitting: Family Medicine

## 2017-11-18 VITALS — BP 144/90 | HR 74 | Resp 16 | Ht 72.0 in | Wt 211.0 lb

## 2017-11-18 DIAGNOSIS — K5909 Other constipation: Secondary | ICD-10-CM

## 2017-11-18 DIAGNOSIS — J3089 Other allergic rhinitis: Secondary | ICD-10-CM

## 2017-11-18 DIAGNOSIS — N183 Chronic kidney disease, stage 3 unspecified: Secondary | ICD-10-CM

## 2017-11-18 DIAGNOSIS — Z9884 Bariatric surgery status: Secondary | ICD-10-CM | POA: Diagnosis not present

## 2017-11-18 DIAGNOSIS — I700 Atherosclerosis of aorta: Secondary | ICD-10-CM | POA: Diagnosis not present

## 2017-11-18 DIAGNOSIS — J302 Other seasonal allergic rhinitis: Secondary | ICD-10-CM

## 2017-11-18 DIAGNOSIS — K219 Gastro-esophageal reflux disease without esophagitis: Secondary | ICD-10-CM | POA: Diagnosis not present

## 2017-11-18 DIAGNOSIS — R7303 Prediabetes: Secondary | ICD-10-CM

## 2017-11-18 DIAGNOSIS — E785 Hyperlipidemia, unspecified: Secondary | ICD-10-CM

## 2017-11-18 DIAGNOSIS — I10 Essential (primary) hypertension: Secondary | ICD-10-CM | POA: Diagnosis not present

## 2017-11-18 DIAGNOSIS — F325 Major depressive disorder, single episode, in full remission: Secondary | ICD-10-CM | POA: Diagnosis not present

## 2017-11-18 DIAGNOSIS — G8929 Other chronic pain: Secondary | ICD-10-CM

## 2017-11-18 DIAGNOSIS — M545 Low back pain, unspecified: Secondary | ICD-10-CM

## 2017-11-18 DIAGNOSIS — E559 Vitamin D deficiency, unspecified: Secondary | ICD-10-CM

## 2017-11-18 DIAGNOSIS — Z862 Personal history of diseases of the blood and blood-forming organs and certain disorders involving the immune mechanism: Secondary | ICD-10-CM

## 2017-11-18 MED ORDER — LORATADINE 10 MG PO TABS: 10.0000 mg | ORAL_TABLET | Freq: Two times a day (BID) | ORAL | 0 refills | Status: DC

## 2017-11-18 MED ORDER — LINACLOTIDE 72 MCG PO CAPS: 72.0000 ug | ORAL_CAPSULE | Freq: Every day | ORAL | 0 refills | Status: DC

## 2017-11-18 MED ORDER — LOSARTAN POTASSIUM 25 MG PO TABS: 25.0000 mg | ORAL_TABLET | Freq: Every day | ORAL | 1 refills | Status: DC

## 2017-11-18 MED ORDER — AZELASTINE-FLUTICASONE 137-50 MCG/ACT NA SUSP: 1.0000 | Freq: Two times a day (BID) | NASAL | 2 refills | Status: DC

## 2017-11-18 MED ORDER — RANITIDINE HCL 150 MG PO TABS: 150.0000 mg | ORAL_TABLET | Freq: Every day | ORAL | 0 refills | Status: DC | PRN

## 2017-11-18 MED ORDER — ATORVASTATIN CALCIUM 40 MG PO TABS: 40.0000 mg | ORAL_TABLET | Freq: Every day | ORAL | 1 refills | Status: DC

## 2017-11-18 NOTE — Progress Notes (Signed)
Name: Melissa [name]   MRN: [id number]    DOB: Nov 16, 1950   Date:11/18/2017       Progress Note  Subjective  Chief Complaint  Chief Complaint  Patient presents with  . Medication Refill  . Gastroesophageal Reflux    Increased symptoms  . Constipation    severe    HPI   Depression Major : she has a long history of depression, she had relapse Fall 2018 when she decided to retire. She was crying, no energy, feeling overwhelmed. She finally January 3rd, 2019 and is feeling well since. She is thinking about taking knitting and crochet classes, floral arrangements.  Still dating , he lives in [place] and seeing each other more often.   Neck pain: she has a long history of radiculitis, she had injections last week and it seems to help,  Pain right now is 2/10 and not radiating down her arm.  SOB/cough: seen by pulmonologist and cardiologist, dry cough finally resolved, she took  Pulmicort for two months and symptoms resolved, she has been off medication for a few months without any problems, it may have a post-bronchial cough, since symptoms started after an URI  CKI stage III: seeing Dr. Abigail Butts, we will recheck labs. No itching and has normal urine ouput  HTN: her DBP is borderline today. She is taking ARB, tolerating medication well. No chest pain or palpitation  Atherosclerosis: taking Atorvastatin, denies side effects - unchanged.   History of obesity s/p surgery : doing well, will resume physical activity and healthy diet  Chronic constipation :she states she has always been constipated, she needs to be in complete silence. Taking stool softeners daily and bowel movements are about twice a week at most. She never tried any other medications. Discussed options and we will start low dose Linzess. She has a history of iron deficiency anemia. Colonoscopy is up to date, but if still anemic we will give her another set of hemoccult cards.    Patient Active Problem List   Diagnosis Date  Noted  . Cardiac LV ejection fraction of 40-49% 05/07/2017  . Low back pain without sciatica 02/21/2017  . Renal cyst 10/18/2016  . Atherosclerosis of aorta ([place]) 10/16/2016  . History of colonic polyps 09/25/2016  . Dyslipidemia 12/08/2015  . Gastroesophageal reflux disease without esophagitis 08/11/2015  . Chronic kidney disease (CKD), stage III (moderate) ([place]) 07/02/2015  . Primary osteoarthritis of knee 06/13/2015  . Hip bursitis 06/13/2015  . RLS (restless legs syndrome) 06/13/2015  . Depression, major, in remission ([place]) 06/13/2015  . B12 deficiency 06/13/2015  . History of Roux-en-Y gastric bypass 06/13/2015  . History of iron deficiency anemia 06/13/2015  . Vitamin D deficiency 06/13/2015  . Hypertension, benign 06/13/2015  . Perennial allergic rhinitis with seasonal variation 06/13/2015  . Obesity (BMI 30.0-34.9) 06/13/2015  . Cervical radiculitis 01/04/2014  . Cervical spinal stenosis 01/04/2014  . Cervical osteoarthritis 01/04/2014    Past Surgical History:  Procedure Laterality Date  . BARIATRIC SURGERY    . CESAREAN SECTION     3 or more  . GASTRIC BYPASS    . TUBAL LIGATION      Family History  Problem Relation Age of Onset  . Hypercholesterolemia Mother   . Heart disease Mother   . Hypertension Mother   . Alcohol abuse Father   . Lung cancer Brother   . Alcohol abuse Brother   . Diabetes Mellitus II Sister   . Colon cancer Sister   . Breast cancer  Neg Hx     Social History   Socioeconomic History  . Marital status: Divorced    Spouse name: Not on file  . Number of children: 4  . Years of education: Not on file  . Highest education level: Not on file  Occupational History  . Not on file  Social Needs  . Financial resource strain: Not on file  . Food insecurity:    Worry: Not on file    Inability: Not on file  . Transportation needs:    Medical: Not on file    Non-medical: Not on file  Tobacco Use  . Smoking status: Never Smoker  .  Smokeless tobacco: Never Used  Substance and Sexual Activity  . Alcohol use: No    Alcohol/week: 0.0 oz  . Drug use: No  . Sexual activity: Yes  Lifestyle  . Physical activity:    Days per week: Not on file    Minutes per session: Not on file  . Stress: Not on file  Relationships  . Social connections:    Talks on phone: Not on file    Gets together: Not on file    Attends religious service: Not on file    Active member of club or organization: Not on file    Attends meetings of clubs or organizations: Not on file    Relationship status: Not on file  . Intimate partner violence:    Fear of current or ex partner: Not on file    Emotionally abused: Not on file    Physically abused: Not on file    Forced sexual activity: Not on file  Other Topics Concern  . Not on file  Social History Narrative  . Not on file     Current Outpatient Medications:  .  atorvastatin (LIPITOR) 40 MG tablet, Take 1 tablet (40 mg total) by mouth daily., Disp: 90 tablet, Rfl: 1 .  Azelastine-Fluticasone (DYMISTA) 137-50 MCG/ACT SUSP, Place 1 spray into the nose 2 (two) times daily., Disp: 23 g, Rfl: 2 .  loratadine (CLARITIN) 10 MG tablet, Take 1 tablet (10 mg total) by mouth 2 (two) times daily., Disp: 180 tablet, Rfl: 0 .  losartan (COZAAR) 25 MG tablet, Take 1 tablet (25 mg total) by mouth daily., Disp: 90 tablet, Rfl: 1 .  magnesium oxide (MAG-OX) 400 MG tablet, Take 400 mg by mouth daily. Reported on 12/08/2015, Disp: , Rfl:  .  traMADol (ULTRAM) 50 MG tablet, Take 1 tablet (50 mg total) by mouth every 8 (eight) hours as needed., Disp: 90 tablet, Rfl: 0 .  Cholecalciferol (VITAMIN D3 ADULT GUMMIES PO), Take 1 each by mouth daily., Disp: , Rfl:  .  linaclotide (LINZESS) 72 MCG capsule, Take 1 capsule (72 mcg total) by mouth daily before breakfast., Disp: 30 capsule, Rfl: 0 .  ranitidine (ZANTAC) 150 MG tablet, Take 1 tablet (150 mg total) by mouth daily as needed for heartburn., Disp: 90 tablet, Rfl:  0  Allergies  Allergen Reactions  . Contrast Media [Iodinated Diagnostic Agents]   . Shellfish Allergy     Edema     ROS  Constitutional: Negative for fever or significant  weight change.  Respiratory: Negative for cough and shortness of breath.   Cardiovascular: Negative for chest pain or palpitations.  Gastrointestinal: Negative for abdominal pain, no bowel changes.  Musculoskeletal:Positive for gait problem - intermittent because of knee pain, no joint swelling.  Skin: Negative for rash.  Neurological: Negative for dizziness or headache.  No  other specific complaints in a complete review of systems (except as listed in HPI above).  Objective  Vitals:   11/18/17 1112  BP: (!) 144/90  Pulse: 74  Resp: 16  SpO2: 96%  Weight: 211 lb (95.7 kg)  Height: 6' (1.829 m)    Body mass index is 28.62 kg/m.  Physical Exam   Constitutional: Patient appears well-developed and well-nourished. Overweight.  No distress.  HEENT: head atraumatic, normocephalic, pupils equal and reactive to light,  neck supple, throat within normal limits.  Cardiovascular: Normal rate, regular rhythm and normal heart sounds.  No murmur heard. No BLE edema. Pulmonary/Chest: Effort normal and breath sounds normal. No respiratory distress. Abdominal: Soft.  There is no tenderness. Psychiatric: Patient has a normal mood and affect. behavior is normal. Judgment and thought content normal.   PHQ2/9: Depression screen [facility] 2/9 11/18/2017 07/25/2017 06/09/2017 08/21/2016 05/07/2016  Decreased Interest 0 0 0 0 0  Down, Depressed, Hopeless 0 1 0 0 0  PHQ - 2 Score 0 1 0 0 0  Altered sleeping 0 1 - - -  Tired, decreased energy 0 1 - - -  Change in appetite 1 3 - - -  Feeling bad or failure about yourself  0 3 - - -  Trouble concentrating 0 0 - - -  Moving slowly or fidgety/restless 0 0 - - -  Suicidal thoughts 0 0 - - -  PHQ-9 Score 1 9 - - -  Difficult doing work/chores Not difficult at all Somewhat difficult  - - -     Fall Risk: Fall Risk  11/18/2017 07/25/2017 06/09/2017 08/21/2016 05/07/2016  Falls in the past year? No No No No No  Number falls in past yr: - - - - -  Injury with Fall? - - - - -    Functional Status Survey: Is the patient deaf or have difficulty hearing?: No Does the patient have difficulty seeing, even when wearing glasses/contacts?: No Does the patient have difficulty concentrating, remembering, or making decisions?: No Does the patient have difficulty walking or climbing stairs?: No Does the patient have difficulty dressing or bathing?: No Does the patient have difficulty doing errands alone such as visiting a doctor's office or shopping?: No   Assessment & Plan  1. Chronic kidney disease (CKD), stage III (moderate) (HCC)  - COMPLETE METABOLIC PANEL WITH GFR  2. Hypertension, benign  - losartan (COZAAR) 25 MG tablet; Take 1 tablet (25 mg total) by mouth daily.  Dispense: 90 tablet; Refill: 1 - COMPLETE METABOLIC PANEL WITH GFR  3. Atherosclerosis of aorta (HCC)  - atorvastatin (LIPITOR) 40 MG tablet; Take 1 tablet (40 mg total) by mouth daily.  Dispense: 90 tablet; Refill: 1 - Lipid panel  4. Perennial allergic rhinitis with seasonal variation  - Azelastine-Fluticasone (DYMISTA) 137-50 MCG/ACT SUSP; Place 1 spray into the nose 2 (two) times daily.  Dispense: 23 g; Refill: 2 - loratadine (CLARITIN) 10 MG tablet; Take 1 tablet (10 mg total) by mouth 2 (two) times daily.  Dispense: 180 tablet; Refill: 0  5. Depression, major, in remission [facility])  Doing well since she retired January 2019  6. History of Roux-en-Y gastric bypass  She needs to continue medication   7. Prediabetes  - Hemoglobin A1c  8. Dyslipidemia  - Lipid panel  9. History of iron deficiency anemia  - CBC with Differential/Platelet - Iron, TIBC and Ferritin Panel; Future  10. Chronic left-sided low back pain without sciatica  Seeing Dr. Phyllis Ginger prn now  11. GERD without  esophagitis  - ranitidine (ZANTAC) 150 MG tablet; Take 1 tablet (150 mg total) by mouth daily as needed for heartburn.  Dispense: 90 tablet; Refill: 0  12. Vitamin D deficiency  - VITAMIN D 25 Hydroxy (Vit-D Deficiency, Fractures)  13. Chronic constipation  - linaclotide (LINZESS) 72 MCG capsule; Take 1 capsule (72 mcg total) by mouth daily before breakfast.  Dispense: 30 capsule; Refill: 0

## 2017-12-17 ENCOUNTER — Other Ambulatory Visit: Payer: Self-pay | Admitting: Family Medicine

## 2017-12-17 DIAGNOSIS — K5909 Other constipation: Secondary | ICD-10-CM

## 2017-12-17 NOTE — Telephone Encounter (Signed)
Refill request for general medication. [facility]    Last office visit: 11/18/2017   Follow up 02/24/2018

## 2017-12-17 NOTE — Telephone Encounter (Signed)
Copied from [place] [id number]. Topic: Quick Communication - See Telephone Encounter >> Dec 17, 2017  8:04 AM Conception Chancy, NT wrote: CRM for notification. See Telephone encounter for: 12/17/17.  Patient is needing a refill on linaclotide (LINZESS) 72 MCG capsule. She states her pharmacy told her she could pick it up Friday 12/19/17 but she is going out of town on 12/18/17. Please advise.  [id number] [place] [address] [place] [address] Phone: [phone] Fax: [phone]

## 2018-02-15 ENCOUNTER — Other Ambulatory Visit: Payer: Self-pay | Admitting: Family Medicine

## 2018-02-15 DIAGNOSIS — K219 Gastro-esophageal reflux disease without esophagitis: Secondary | ICD-10-CM

## 2018-02-24 ENCOUNTER — Ambulatory Visit: Payer: Federal, State, Local not specified - PPO | Admitting: Family Medicine

## 2018-02-24 ENCOUNTER — Encounter: Payer: Self-pay | Admitting: Family Medicine

## 2018-02-24 VITALS — BP 138/84 | HR 91 | Temp 98.0°F | Resp 16 | Ht 72.0 in | Wt 211.5 lb

## 2018-02-24 DIAGNOSIS — Z131 Encounter for screening for diabetes mellitus: Secondary | ICD-10-CM

## 2018-02-24 DIAGNOSIS — N183 Chronic kidney disease, stage 3 unspecified: Secondary | ICD-10-CM

## 2018-02-24 DIAGNOSIS — Z9884 Bariatric surgery status: Secondary | ICD-10-CM

## 2018-02-24 DIAGNOSIS — D649 Anemia, unspecified: Secondary | ICD-10-CM

## 2018-02-24 DIAGNOSIS — I700 Atherosclerosis of aorta: Secondary | ICD-10-CM | POA: Diagnosis not present

## 2018-02-24 DIAGNOSIS — I10 Essential (primary) hypertension: Secondary | ICD-10-CM | POA: Diagnosis not present

## 2018-02-24 DIAGNOSIS — Z23 Encounter for immunization: Secondary | ICD-10-CM | POA: Diagnosis not present

## 2018-02-24 DIAGNOSIS — Z87898 Personal history of other specified conditions: Secondary | ICD-10-CM

## 2018-02-24 DIAGNOSIS — E559 Vitamin D deficiency, unspecified: Secondary | ICD-10-CM | POA: Diagnosis not present

## 2018-02-24 DIAGNOSIS — K5909 Other constipation: Secondary | ICD-10-CM | POA: Diagnosis not present

## 2018-02-24 DIAGNOSIS — Z862 Personal history of diseases of the blood and blood-forming organs and certain disorders involving the immune mechanism: Secondary | ICD-10-CM | POA: Diagnosis not present

## 2018-02-24 DIAGNOSIS — F325 Major depressive disorder, single episode, in full remission: Secondary | ICD-10-CM | POA: Diagnosis not present

## 2018-02-24 MED ORDER — LINACLOTIDE 145 MCG PO CAPS: 145.0000 ug | ORAL_CAPSULE | Freq: Every day | ORAL | 2 refills | Status: DC

## 2018-02-24 MED ORDER — SCOPOLAMINE 1 MG/3DAYS TD PT72: 1.0000 | MEDICATED_PATCH | TRANSDERMAL | 0 refills | Status: DC

## 2018-02-24 NOTE — Progress Notes (Signed)
Name: Melissa [name]   MRN: [id number]    DOB: 01-24-1951   Date:02/24/2018       Progress Note  Subjective  Chief Complaint  Chief Complaint  Patient presents with  . Medication Refill  . Depression    Always Tired  . Constipation    States she needs a increase of her medication-going to bathroom twice weekly  . Hip Pain    Seeing Dr. Sharlet Salina tomorrow  . Hyperlipidemia  . Hypertension    Denies any symptoms  . Gastroesophageal Reflux    HPI   Depression Major : she has a long history of depression, she had relapse Fall 2018 when she decided to retire. She was crying, no energy, feeling overwhelmed. She finally retired January 3rd, 2019 and is feeling well since. She has started a garden and is staying busy.   Hip pain: seeing Dr. Phyllis Ginger tomorrow   Neck pain: she has a long history of radiculitis, she had injections last week and it seems to help,  Pain right now is 0/10 and not radiating down her arm at this time. Still tramadol occasionally  SOB/cough: seen by pulmonologist and cardiologist, dry cough finally resolved, she took  Pulmicort for two months and symptoms resolved, however she states symptoms is returning again. She states symptoms not as severe, only when laughing or a deep breath   CKI stage III: seeing Dr. Abigail Butts,. No itching and has normal urine ouput. We will recheck labs  HTN: bp is at goal today. She is taking ARB, tolerating medication well. No chest pain or palpitation  Atherosclerosis: taking Atorvastatin, denies side effects. Unchanged   History of obesity s/p surgery : doing well, will resume physical activity, she has noticed fatigue, history of iron deficiency anemia, we will check labs and advised to resume iron supplements otc   Chronic constipation :she states she has always been constipated, she needs to be in complete silence. She states initially Linzess 72 mg was working, but now it takes 3 days to have a bowel movement, she has to  strain at times, we will increase the dose today and monitor, due for repeat colonoscopy soon  Hyperlipidemia: taking atorvastatin and has noticed some cramping at night , we will make sure not symptoms of RLS triggered by iron deficiency anemia, if normal iron studies, we will try coq 10 otc and also requip   Patient Active Problem List   Diagnosis Date Noted  . Cardiac LV ejection fraction of 40-49% 05/07/2017  . Low back pain without sciatica 02/21/2017  . Renal cyst 10/18/2016  . Atherosclerosis of aorta ([place]) 10/16/2016  . History of colonic polyps 09/25/2016  . Dyslipidemia 12/08/2015  . Gastroesophageal reflux disease without esophagitis 08/11/2015  . Chronic kidney disease (CKD), stage III (moderate) ([place]) 07/02/2015  . Primary osteoarthritis of knee 06/13/2015  . Hip bursitis 06/13/2015  . RLS (restless legs syndrome) 06/13/2015  . Depression, major, in remission ([place]) 06/13/2015  . B12 deficiency 06/13/2015  . History of Roux-en-Y gastric bypass 06/13/2015  . History of iron deficiency anemia 06/13/2015  . Vitamin D deficiency 06/13/2015  . Hypertension, benign 06/13/2015  . Perennial allergic rhinitis with seasonal variation 06/13/2015  . Obesity (BMI 30.0-34.9) 06/13/2015  . Cervical radiculitis 01/04/2014  . Cervical spinal stenosis 01/04/2014  . Cervical osteoarthritis 01/04/2014    Past Surgical History:  Procedure Laterality Date  . BARIATRIC SURGERY    . CESAREAN SECTION     3 or more  . GASTRIC BYPASS    .  TUBAL LIGATION      Family History  Problem Relation Age of Onset  . Hypercholesterolemia Mother   . Heart disease Mother   . Hypertension Mother   . Alcohol abuse Father   . Lung cancer Brother   . Alcohol abuse Brother   . Diabetes Mellitus II Sister   . Hypertension Maternal Grandmother   . Colon cancer Sister   . Breast cancer Neg Hx     Social History   Socioeconomic History  . Marital status: Divorced    Spouse name: Not on file  .  Number of children: 4  . Years of education: Not on file  . Highest education level: Not on file  Occupational History  . Not on file  Social Needs  . Financial resource strain: Not on file  . Food insecurity:    Worry: Not on file    Inability: Not on file  . Transportation needs:    Medical: Not on file    Non-medical: Not on file  Tobacco Use  . Smoking status: Never Smoker  . Smokeless tobacco: Never Used  Substance and Sexual Activity  . Alcohol use: No    Alcohol/week: 0.0 oz  . Drug use: No  . Sexual activity: Yes    Partners: Male  Lifestyle  . Physical activity:    Days per week: Not on file    Minutes per session: Not on file  . Stress: Not on file  Relationships  . Social connections:    Talks on phone: Not on file    Gets together: Not on file    Attends religious service: Not on file    Active member of club or organization: Not on file    Attends meetings of clubs or organizations: Not on file    Relationship status: Not on file  . Intimate partner violence:    Fear of current or ex partner: Not on file    Emotionally abused: Not on file    Physically abused: Not on file    Forced sexual activity: Not on file  Other Topics Concern  . Not on file  Social History Narrative  . Not on file     Current Outpatient Medications:  .  atorvastatin (LIPITOR) 40 MG tablet, Take 1 tablet (40 mg total) by mouth daily., Disp: 90 tablet, Rfl: 1 .  Azelastine-Fluticasone (DYMISTA) 137-50 MCG/ACT SUSP, Place 1 spray into the nose 2 (two) times daily., Disp: 23 g, Rfl: 2 .  Cholecalciferol (VITAMIN D3 ADULT GUMMIES PO), Take 2 each by mouth daily. , Disp: , Rfl:  .  loratadine (CLARITIN) 10 MG tablet, Take 1 tablet (10 mg total) by mouth 2 (two) times daily., Disp: 180 tablet, Rfl: 0 .  losartan (COZAAR) 25 MG tablet, Take 1 tablet (25 mg total) by mouth daily., Disp: 90 tablet, Rfl: 1 .  magnesium oxide (MAG-OX) 400 MG tablet, Take 400 mg by mouth daily. Reported on  12/08/2015, Disp: , Rfl:  .  ranitidine (ZANTAC) 150 MG tablet, TAKE 1 TABLET (150 MG TOTAL) BY MOUTH DAILY AS NEEDED FOR HEARTBURN., Disp: 90 tablet, Rfl: 0 .  traMADol (ULTRAM) 50 MG tablet, Take 1 tablet (50 mg total) by mouth every 8 (eight) hours as needed., Disp: 90 tablet, Rfl: 0 .  linaclotide (LINZESS) 145 MCG CAPS capsule, Take 1 capsule (145 mcg total) by mouth daily before breakfast., Disp: 30 capsule, Rfl: 2 .  scopolamine (TRANSDERM-SCOP) 1 MG/3DAYS, Place 1 patch (1.5 mg total) onto  the skin every 3 (three) days., Disp: 4 patch, Rfl: 0  Allergies  Allergen Reactions  . Contrast Media [Iodinated Diagnostic Agents]   . Shellfish Allergy     Edema     ROS  Constitutional: Negative for fever or weight change.  Respiratory: Negative for cough and shortness of breath.   Cardiovascular: Negative for chest pain or palpitations.  Gastrointestinal: Negative for abdominal pain, no bowel changes.  Musculoskeletal: Negative for gait problem or joint swelling.  Skin: Negative for rash.  Neurological: Negative for dizziness or headache.  No other specific complaints in a complete review of systems (except as listed in HPI above).  Objective  Vitals:   02/24/18 1121  BP: 138/84  Pulse: 91  Resp: 16  Temp: 98 F (36.7 C)  TempSrc: Oral  SpO2: 98%  Weight: 211 lb 8 oz (95.9 kg)  Height: 6' (1.829 m)    Body mass index is 28.68 kg/m.  Physical Exam  Constitutional: Patient appears well-developed and well-nourished. Overweight.  No distress.  HEENT: head atraumatic, normocephalic, pupils equal and reactive to light,, neck supple, throat within normal limits Cardiovascular: Normal rate, regular rhythm and normal heart sounds.  No murmur heard. No BLE edema. Pulmonary/Chest: Effort normal and breath sounds normal. No respiratory distress. Abdominal: Soft.  There is no tenderness. Psychiatric: Patient has a normal mood and affect. behavior is normal. Judgment and thought  content normal.  PHQ2/9: Depression screen [facility] 2/9 02/24/2018 11/18/2017 07/25/2017 06/09/2017 08/21/2016  Decreased Interest 0 0 0 0 0  Down, Depressed, Hopeless 0 0 1 0 0  PHQ - 2 Score 0 0 1 0 0  Altered sleeping 0 0 1 - -  Tired, decreased energy 3 0 1 - -  Change in appetite 0 1 3 - -  Feeling bad or failure about yourself  0 0 3 - -  Trouble concentrating 0 0 0 - -  Moving slowly or fidgety/restless 0 0 0 - -  Suicidal thoughts 0 0 0 - -  PHQ-9 Score 3 1 9  - -  Difficult doing work/chores Not difficult at all Not difficult at all Somewhat difficult - -     Fall Risk: Fall Risk  02/24/2018 11/18/2017 07/25/2017 06/09/2017 08/21/2016  Falls in the past year? No No No No No  Number falls in past yr: - - - - -  Injury with Fall? - - - - -     Functional Status Survey: Is the patient deaf or have difficulty hearing?: No Does the patient have difficulty seeing, even when wearing glasses/contacts?: Yes Does the patient have difficulty concentrating, remembering, or making decisions?: No Does the patient have difficulty walking or climbing stairs?: Yes(Due to hip and back pain) Does the patient have difficulty dressing or bathing?: No Does the patient have difficulty doing errands alone such as visiting a doctor's office or shopping?: No   Assessment & Plan  1. Chronic constipation  - linaclotide (LINZESS) 145 MCG CAPS capsule; Take 1 capsule (145 mcg total) by mouth daily before breakfast.  Dispense: 30 capsule; Refill: 2  2. Need for vaccination for pneumococcus  - Pneumococcal conjugate vaccine 13-valent IM  3. Need for shingles vaccine  - Varicella-zoster vaccine IM  4. H/O motion sickness  - scopolamine (TRANSDERM-SCOP) 1 MG/3DAYS; Place 1 patch (1.5 mg total) onto the skin every 3 (three) days.  Dispense: 4 patch; Refill: 0  5. History of iron deficiency anemia  - Iron, TIBC and Ferritin Panel   6.  Depression, major, in remission ([place])  7. Atherosclerosis of aorta  (HCC)  On statin   8. Hypertension, benign  At goal   9. Chronic kidney disease (CKD), stage III (moderate) ([place])  Recheck labs  10. History of Roux-en-Y gastric bypass  Resume iron supplementation   11. Vitamin D deficiency  Recheck labs  12. Anemia, unspecified type  -CBC

## 2018-02-25 ENCOUNTER — Other Ambulatory Visit: Payer: Self-pay | Admitting: Physical Medicine and Rehabilitation

## 2018-02-25 DIAGNOSIS — M5416 Radiculopathy, lumbar region: Secondary | ICD-10-CM

## 2018-02-25 LAB — LIPID PANEL
Cholesterol: 187 mg/dL (ref <200)
HDL: 84 mg/dL (ref >50)
LDL Cholesterol (Calc): 88 mg/dL (calc)
Non-HDL Cholesterol (Calc): 103 mg/dL (calc) (ref <130)
Total CHOL/HDL Ratio: 2.2 (calc) (ref <5.0)
Triglycerides: 67 mg/dL (ref <150)

## 2018-02-25 LAB — COMPLETE METABOLIC PANEL WITH GFR
AG Ratio: 1.2 (calc) (ref 1.0–2.5)
ALT: 12 U/L (ref 6–29)
AST: 21 U/L (ref 10–35)
Albumin: 3.9 g/dL (ref 3.6–5.1)
Alkaline phosphatase (APISO): 90 U/L (ref 33–130)
BUN/Creatinine Ratio: 12 (calc) (ref 6–22)
BUN: 15 mg/dL (ref 7–25)
CO2: 27 mmol/L (ref 20–32)
Calcium: 9.2 mg/dL (ref 8.6–10.4)
Chloride: 107 mmol/L (ref 98–110)
Creat: 1.29 mg/dL — ABNORMAL HIGH (ref 0.50–0.99)
GFR, Est African American: 50 mL/min/{1.73_m2} — ABNORMAL LOW (ref >=60)
GFR, Est Non African American: 43 mL/min/{1.73_m2} — ABNORMAL LOW (ref >=60)
Globulin: 3.2 g/dL (calc) (ref 1.9–3.7)
Glucose, Bld: 94 mg/dL (ref 65–139)
Potassium: 4.4 mmol/L (ref 3.5–5.3)
Sodium: 140 mmol/L (ref 135–146)
Total Bilirubin: 0.4 mg/dL (ref 0.2–1.2)
Total Protein: 7.1 g/dL (ref 6.1–8.1)

## 2018-02-25 LAB — CBC WITH DIFFERENTIAL/PLATELET
Basophils Absolute: 31 cells/uL (ref 0–200)
Basophils Relative: 0.5 %
Eosinophils Absolute: 67 cells/uL (ref 15–500)
Eosinophils Relative: 1.1 %
HCT: 34.8 % — ABNORMAL LOW (ref 35.0–45.0)
Hemoglobin: 11.6 g/dL — ABNORMAL LOW (ref 11.7–15.5)
Lymphs Abs: 1653 cells/uL (ref 850–3900)
MCH: 29 pg (ref 27.0–33.0)
MCHC: 33.3 g/dL (ref 32.0–36.0)
MCV: 87 fL (ref 80.0–100.0)
MPV: 10.2 fL (ref 7.5–12.5)
Monocytes Relative: 9.7 %
Neutro Abs: 3758 cells/uL (ref 1500–7800)
Neutrophils Relative %: 61.6 %
Platelets: 262 10*3/uL (ref 140–400)
RBC: 4 10*6/uL (ref 3.80–5.10)
RDW: 12.5 % (ref 11.0–15.0)
Total Lymphocyte: 27.1 %
WBC mixed population: 592 cells/uL (ref 200–950)
WBC: 6.1 10*3/uL (ref 3.8–10.8)

## 2018-02-25 LAB — HEMOGLOBIN A1C
Hgb A1c MFr Bld: 5.4 % of total Hgb (ref <5.7)
Mean Plasma Glucose: 108 (calc)
eAG (mmol/L): 6 (calc)

## 2018-02-25 LAB — VITAMIN D 25 HYDROXY (VIT D DEFICIENCY, FRACTURES): Vit D, 25-Hydroxy: 32 ng/mL (ref 30–100)

## 2018-02-25 LAB — IRON,TIBC AND FERRITIN PANEL
%SAT: 21 % (calc) (ref 16–45)
Ferritin: 35 ng/mL (ref 16–288)
Iron: 57 ug/dL (ref 45–160)
TIBC: 268 mcg/dL (calc) (ref 250–450)

## 2018-03-09 ENCOUNTER — Ambulatory Visit
Admission: RE | Admit: 2018-03-09 | Discharge: 2018-03-09 | Disposition: A | Discharge status: Home / Self Care | Payer: Federal, State, Local not specified - PPO | Source: Ambulatory Visit | Attending: Physical Medicine and Rehabilitation | Admitting: Physical Medicine and Rehabilitation

## 2018-03-09 DIAGNOSIS — M4807 Spinal stenosis, lumbosacral region: Secondary | ICD-10-CM | POA: Insufficient documentation

## 2018-03-09 DIAGNOSIS — M479 Spondylosis, unspecified: Secondary | ICD-10-CM | POA: Diagnosis not present

## 2018-03-09 DIAGNOSIS — M5416 Radiculopathy, lumbar region: Secondary | ICD-10-CM | POA: Diagnosis not present

## 2018-03-20 ENCOUNTER — Encounter: Payer: Self-pay | Admitting: Family Medicine

## 2018-03-21 ENCOUNTER — Encounter: Payer: Self-pay | Admitting: Family Medicine

## 2018-04-27 DIAGNOSIS — M170 Bilateral primary osteoarthritis of knee: Secondary | ICD-10-CM | POA: Diagnosis not present

## 2018-04-27 NOTE — Progress Notes (Signed)
[facility] Consultation      Assessment and Plan:   Cough with sinus drainage. -Symptoms have recurred. -Start omeprazole 20 mg daily --Start Flonase daily.  Chronic bronchitis vs. Asthma vs. COPD.  Symptoms have not improved. --There is some hyperinflation seen on imaging, but this may be due to her long torso and height, clinically she has no evidence of emphysema and no dyspnea. She does have mildly elevated eosinophil levels which may be suggestive of asthma-- also she has coughing with laughing.  --We will start Qvar 2 puffs twice daily.  Excessive daytime sleepiness. - Symptoms and signs of obstructive sleep apnea. - We will send for sleep study.  Date: 04/27/2018  MRN# [id number] Melissa [name] Apr 05, 1951   Melissa [name] is a 67 y.o. old female seen in consultation for chief complaint of:    Chief Complaint  Patient presents with  . Sinus Problem    no change singce last visit  . Cough    HPI:  The patient is a 67 year old female, last seen about 1 year ago with cough with sinus drainage and symptomatic GERD as well as possible chronic bronchitis symptoms.  She was asked to use omeprazole, Flonase, Qvar.  She was sent for PFT.  She continues to have cough, it actually went away and has now come back. It came back about a month, she did not have cold symptoms. She is not taking omeprazole, flonase, qvar. She never went for the PFT.  She previously noted reflux but feels that it has improved. She has move her dog out of the bedroom. She continues to  Have sinus drainage.  She went on a cruise last month to [place].   She does not snore at night, she is sleepy during the day. She is always sleepy during the day, she takes a nap during the day, she feels tired when she wakes in the am.    **chest x-ray7/6/18>>  hyperinflation consistent with emphysema. **CBC 02/21/17>> eosinophils equals 300.  Medication:    Current Outpatient Medications:   .  atorvastatin (LIPITOR) 40 MG tablet, Take 1 tablet (40 mg total) by mouth daily., Disp: 90 tablet, Rfl: 1 .  Azelastine-Fluticasone (DYMISTA) 137-50 MCG/ACT SUSP, Place 1 spray into the nose 2 (two) times daily., Disp: 23 g, Rfl: 2 .  Cholecalciferol (VITAMIN D3 ADULT GUMMIES PO), Take 2 each by mouth daily. , Disp: , Rfl:  .  linaclotide (LINZESS) 145 MCG CAPS capsule, Take 1 capsule (145 mcg total) by mouth daily before breakfast., Disp: 30 capsule, Rfl: 2 .  loratadine (CLARITIN) 10 MG tablet, Take 1 tablet (10 mg total) by mouth 2 (two) times daily., Disp: 180 tablet, Rfl: 0 .  losartan (COZAAR) 25 MG tablet, Take 1 tablet (25 mg total) by mouth daily., Disp: 90 tablet, Rfl: 1 .  magnesium oxide (MAG-OX) 400 MG tablet, Take 400 mg by mouth daily. Reported on 12/08/2015, Disp: , Rfl:  .  ranitidine (ZANTAC) 150 MG tablet, TAKE 1 TABLET (150 MG TOTAL) BY MOUTH DAILY AS NEEDED FOR HEARTBURN., Disp: 90 tablet, Rfl: 0 .  scopolamine (TRANSDERM-SCOP) 1 MG/3DAYS, Place 1 patch (1.5 mg total) onto the skin every 3 (three) days., Disp: 4 patch, Rfl: 0 .  traMADol (ULTRAM) 50 MG tablet, Take 1 tablet (50 mg total) by mouth every 8 (eight) hours as needed., Disp: 90 tablet, Rfl: 0   Allergies:  Contrast media [iodinated diagnostic agents] and Shellfish allergy    Review of Systems:  Constitutional: Feels well. Cardiovascular: No chest pain.  Pulmonary: Denies dyspnea.   The remainder of systems were reviewed and were found to be negative other than what is documented in the HPI.    Physical Examination:   VS: BP 104/70 (BP Location: Left Arm, Cuff Size: Normal)   Pulse 79   Resp 16   Ht 6' (1.829 m)   Wt 204 lb (92.5 kg)   SpO2 97%   BMI 27.67 kg/m   General Appearance: No distress  Neuro:without focal findings, mental status, speech normal, alert and oriented HEENT: PERRLA, EOM intact Pulmonary: No wheezing, No rales  CardiovascularNormal S1,S2.  No m/r/g.  Abdomen: Benign, Soft,  non-tender, No masses Renal:  No costovertebral tenderness  GU:  No performed at this time. Endoc: No evident thyromegaly, no signs of acromegaly or Cushing features Skin:   warm, no rashes, no ecchymosis  Extremities: normal, no cyanosis, clubbing.    LABORATORY PANEL:   CBC No results for input(s): WBC, HGB, HCT, PLT in the last 168 hours. ------------------------------------------------------------------------------------------------------------------  Chemistries  No results for input(s): NA, K, CL, CO2, GLUCOSE, BUN, CREATININE, CALCIUM, MG, AST, ALT, ALKPHOS, BILITOT in the last 168 hours.  Invalid input(s): GFRCGP ------------------------------------------------------------------------------------------------------------------  Cardiac Enzymes No results for input(s): TROPONINI in the last 168 hours. ------------------------------------------------------------  RADIOLOGY:  No results found.     Thank  you for the consultation and for allowing [facility] to assist in the care of your patient. Our recommendations are noted above.  Please contact us if we can be of further service.   Marda Stalker, MD.  Board Certified in Internal Medicine, Pulmonary Medicine, [address], and Sleep Medicine.  [facility] Office Number: [phone]  Dreonna Pesa, M.D.  Merton Border, M.D  04/27/2018

## 2018-04-28 ENCOUNTER — Encounter: Payer: Self-pay | Admitting: Internal Medicine

## 2018-04-28 ENCOUNTER — Ambulatory Visit (INDEPENDENT_AMBULATORY_CARE_PROVIDER_SITE_OTHER): Payer: Medicare Other | Admitting: Internal Medicine

## 2018-04-28 VITALS — BP 104/70 | HR 79 | Resp 16 | Ht 72.0 in | Wt 204.0 lb

## 2018-04-28 DIAGNOSIS — G4719 Other hypersomnia: Secondary | ICD-10-CM | POA: Diagnosis not present

## 2018-04-28 MED ORDER — BECLOMETHASONE DIPROP HFA 80 MCG/ACT IN AERB: 1.0000 | INHALATION_SPRAY | Freq: Two times a day (BID) | RESPIRATORY_TRACT | 2 refills | Status: DC

## 2018-04-28 MED ORDER — BECLOMETHASONE DIPROPIONATE 80 MCG/ACT IN AERS: 2.0000 | INHALATION_SPRAY | Freq: Two times a day (BID) | RESPIRATORY_TRACT | 6 refills | Status: DC

## 2018-04-28 NOTE — Addendum Note (Signed)
Addended by: Stephanie Coup on: 04/28/2018 03:16 PM   Modules accepted: Orders

## 2018-04-28 NOTE — Patient Instructions (Addendum)
Start omeprazole 20 mg daily.  Start qvar 2 puffs twice daily, rinse mouth after use.   Start mucinex DM three times daily.  Will send for sleep study.    Sleep Apnea    Sleep apnea is disorder that affects a person's sleep. A person with sleep apnea has abnormal pauses in their breathing when they sleep. It is hard for them to get a good sleep. This makes a person tired during the day. It also can lead to other physical problems. There are three types of sleep apnea. One type is when breathing stops for a short time because your airway is blocked (obstructive sleep apnea). Another type is when the brain sometimes fails to give the normal signal to breathe to the muscles that control your breathing (central sleep apnea). The third type is a combination of the other two types.  HOME CARE   Take all medicine as told by your doctor.  Avoid alcohol, calming medicines (sedatives), and depressant drugs.  Try to lose weight if you are overweight. Talk to your doctor about a healthy weight goal.  Your doctor may have you use a device that helps to open your airway. It can help you get the air that you need. It is called a positive airway pressure (PAP) device.   MAKE SURE YOU:   Understand these instructions.  Will watch your condition.  Will get help right away if you are not doing well or get worse.  It may take approximately 1 month for you to get used to wearing her CPAP every night.  Be sure to work with your machine to get used to it, be patient, it may take time!  If you have trouble tolerating CPAP DO NOT RETURN YOUR MACHINE; Contact our office to see if we can help you tolerate the CPAP better first!

## 2018-04-29 ENCOUNTER — Other Ambulatory Visit: Payer: Self-pay | Admitting: Internal Medicine

## 2018-04-29 ENCOUNTER — Telehealth: Payer: Self-pay | Admitting: Internal Medicine

## 2018-04-29 MED ORDER — OMEPRAZOLE 20 MG PO CPDR: 20.0000 mg | DELAYED_RELEASE_CAPSULE | Freq: Every day | ORAL | 11 refills | Status: DC

## 2018-04-29 NOTE — Telephone Encounter (Signed)
Sent script for omeprazole. Inhaler should be taken 2 puffs twice daily. Rinse mouth after use.

## 2018-04-29 NOTE — Telephone Encounter (Signed)
We have never filled Omeprazole. It was taken off the list. Please advise.

## 2018-04-29 NOTE — Telephone Encounter (Signed)
Patient calling stating she was seen yesterday and we were to send in two prescriptions  She only received the one prescription   She also has a question on the Beclomethasone, on her AVS it states to have two puffs But when she picked it up it said one puff  Would like to make sure what dose she is to take Please advise    *STAT* If patient is at the pharmacy, call can be transferred to refill team.   1. Which medications need to be refilled? (please list name of each medication and dose if known) Omeprazole    2. Which pharmacy/location (including street and city if local pharmacy) is medication to be sent to? [facility] [address]   3. Do they need a 30 day or 90 day supply? [place]

## 2018-04-29 NOTE — Telephone Encounter (Signed)
Pt informed. Nothing further needed. 

## 2018-05-18 NOTE — Progress Notes (Addendum)
[facility] Consultation      Assessment and Plan:   Cough with sinus drainage. -Continues to have daily symptoms. -We will start Dymista 2 sprays in each nostril once daily. --Patient asked to keep pets out of the bedroom in case allergies are affecting her cough.  GERD. - May be contributing to cough. - We will start omeprazole 20 mg once daily.  Chronic bronchitis, asthma. --There is some hyperinflation seen on imaging, but this may be due to her long torso and height, clinically she has no evidence of emphysema and no dyspnea. She does have mildly elevated eosinophil levels which may be suggestive of asthma-- also she has coughing with laughing.  --Qvar 2 puffs twice daily.  Excessive daytime sleepiness. - Sleep study has been scheduled for next week.  Start on CPAP as indicated.  Essential hypertension --Obstructive sleep apnea can contribute to essential hypertension, therefore treatment of sleep apnea is important part of hypertension management.  No orders of the defined types were placed in this encounter.  Return in about 6 weeks (around 06/30/2018).    Date: 05/18/2018  MRN# [id number] Melissa [name] 1951-05-28   Melissa [name] is a 67 y.o. old female seen in consultation for chief complaint of:    Chief Complaint  Patient presents with  . Cough    cough still present    HPI:  The patient is 67 year old female with chronic cough, bronchitis/asthma.  Last visit she was sent for home sleep test, asked to use omeprazole, Flonase, Qvar.   Today the patient continues to have a chronic cough, she thinks it is been present for about 6 weeks.  She has not taken the omeprazole, nasal spray.  She is taking Qvar 2 puffs twice daily, she has not completed the HST, PFT.  She has a dog not in the bed, but in the bedroom.   She does not snore at night, she is sleepy during the day. She is always sleepy during the day, she takes a nap during the  day, she feels tired when she wakes in the am.    **chest x-ray7/6/18>>  hyperinflation consistent with emphysema. **CBC 02/21/17>> eosinophils equals 300.  Medication:    Current Outpatient Medications:  .  atorvastatin (LIPITOR) 40 MG tablet, Take 1 tablet (40 mg total) by mouth daily., Disp: 90 tablet, Rfl: 1 .  Azelastine-Fluticasone (DYMISTA) 137-50 MCG/ACT SUSP, Place 1 spray into the nose 2 (two) times daily., Disp: 23 g, Rfl: 2 .  beclomethasone (QVAR REDIHALER) 80 MCG/ACT inhaler, Inhale 1 puff into the lungs 2 (two) times daily., Disp: 1 Inhaler, Rfl: 2 .  beclomethasone (QVAR) 80 MCG/ACT inhaler, Inhale 2 puffs into the lungs 2 (two) times daily. Rinse mouth after use., Disp: 1 Inhaler, Rfl: 6 .  Cholecalciferol (VITAMIN D3 ADULT GUMMIES PO), Take 2 each by mouth daily. , Disp: , Rfl:  .  linaclotide (LINZESS) 145 MCG CAPS capsule, Take 1 capsule (145 mcg total) by mouth daily before breakfast., Disp: 30 capsule, Rfl: 2 .  loratadine (CLARITIN) 10 MG tablet, Take 1 tablet (10 mg total) by mouth 2 (two) times daily., Disp: 180 tablet, Rfl: 0 .  losartan (COZAAR) 25 MG tablet, Take 1 tablet (25 mg total) by mouth daily., Disp: 90 tablet, Rfl: 1 .  magnesium oxide (MAG-OX) 400 MG tablet, Take 400 mg by mouth daily. Reported on 12/08/2015, Disp: , Rfl:  .  omeprazole (PRILOSEC) 20 MG capsule, Take 1 capsule (20 mg total) by  mouth daily., Disp: 30 capsule, Rfl: 11 .  ranitidine (ZANTAC) 150 MG tablet, TAKE 1 TABLET (150 MG TOTAL) BY MOUTH DAILY AS NEEDED FOR HEARTBURN., Disp: 90 tablet, Rfl: 0 .  scopolamine (TRANSDERM-SCOP) 1 MG/3DAYS, Place 1 patch (1.5 mg total) onto the skin every 3 (three) days., Disp: 4 patch, Rfl: 0 .  traMADol (ULTRAM) 50 MG tablet, Take 1 tablet (50 mg total) by mouth every 8 (eight) hours as needed., Disp: 90 tablet, Rfl: 0   Allergies:  Contrast media [iodinated diagnostic agents] and Shellfish allergy   Review of Systems:  Constitutional: Feels  well. Cardiovascular: Denies chest pain, exertional chest pain.  Pulmonary: Denies hemoptysis, pleuritic chest pain.   The remainder of systems were reviewed and were found to be negative other than what is documented in the HPI.    Physical Examination:   VS: BP 118/70 (BP Location: Left Arm, Cuff Size: Large)   Pulse (!) 107   Resp 16   Ht 6' (1.829 m)   Wt 202 lb (91.6 kg)   SpO2 100%   BMI 27.40 kg/m   General Appearance: No distress  Neuro:without focal findings, mental status, speech normal, alert and oriented HEENT: PERRLA, EOM intact Pulmonary: No wheezing, No rales  CardiovascularNormal S1,S2.  No m/r/g.  Abdomen: Benign, Soft, non-tender, No masses Renal:  No costovertebral tenderness  GU:  No performed at this time. Endoc: No evident thyromegaly, no signs of acromegaly or Cushing features Skin:   warm, no rashes, no ecchymosis  Extremities: normal, no cyanosis, clubbing.      LABORATORY PANEL:   CBC No results for input(s): WBC, HGB, HCT, PLT in the last 168 hours. ------------------------------------------------------------------------------------------------------------------  Chemistries  No results for input(s): NA, K, CL, CO2, GLUCOSE, BUN, CREATININE, CALCIUM, MG, AST, ALT, ALKPHOS, BILITOT in the last 168 hours.  Invalid input(s): GFRCGP ------------------------------------------------------------------------------------------------------------------  Cardiac Enzymes No results for input(s): TROPONINI in the last 168 hours. ------------------------------------------------------------  RADIOLOGY:  No results found.     Thank  you for the consultation and for allowing [facility] to assist in the care of your patient. Our recommendations are noted above.  Please contact us if we can be of further service.  Marda Stalker, M.D., F.C.C.P.  Board Certified in Internal Medicine, Pulmonary Medicine, [place], and Sleep Medicine.  [facility] Office Number: [phone]   05/18/2018

## 2018-05-19 ENCOUNTER — Other Ambulatory Visit: Payer: Self-pay | Admitting: Internal Medicine

## 2018-05-19 ENCOUNTER — Telehealth: Payer: Self-pay | Admitting: Internal Medicine

## 2018-05-19 ENCOUNTER — Ambulatory Visit (INDEPENDENT_AMBULATORY_CARE_PROVIDER_SITE_OTHER): Payer: Medicare Other | Admitting: Internal Medicine

## 2018-05-19 ENCOUNTER — Encounter: Payer: Self-pay | Admitting: Internal Medicine

## 2018-05-19 VITALS — BP 118/70 | HR 107 | Resp 16 | Ht 72.0 in | Wt 202.0 lb

## 2018-05-19 DIAGNOSIS — M5442 Lumbago with sciatica, left side: Secondary | ICD-10-CM | POA: Diagnosis not present

## 2018-05-19 DIAGNOSIS — M5136 Other intervertebral disc degeneration, lumbar region: Secondary | ICD-10-CM | POA: Diagnosis not present

## 2018-05-19 DIAGNOSIS — G4719 Other hypersomnia: Secondary | ICD-10-CM | POA: Diagnosis not present

## 2018-05-19 DIAGNOSIS — J42 Unspecified chronic bronchitis: Secondary | ICD-10-CM

## 2018-05-19 DIAGNOSIS — M48062 Spinal stenosis, lumbar region with neurogenic claudication: Secondary | ICD-10-CM | POA: Diagnosis not present

## 2018-05-19 DIAGNOSIS — M170 Bilateral primary osteoarthritis of knee: Secondary | ICD-10-CM | POA: Diagnosis not present

## 2018-05-19 DIAGNOSIS — M5441 Lumbago with sciatica, right side: Secondary | ICD-10-CM | POA: Diagnosis not present

## 2018-05-19 DIAGNOSIS — M7061 Trochanteric bursitis, right hip: Secondary | ICD-10-CM | POA: Diagnosis not present

## 2018-05-19 DIAGNOSIS — M7062 Trochanteric bursitis, left hip: Secondary | ICD-10-CM | POA: Diagnosis not present

## 2018-05-19 DIAGNOSIS — M5416 Radiculopathy, lumbar region: Secondary | ICD-10-CM | POA: Diagnosis not present

## 2018-05-19 MED ORDER — FLUTICASONE PROPIONATE 50 MCG/ACT NA SUSP: 2.0000 | Freq: Every day | NASAL | 0 refills | Status: DC

## 2018-05-19 MED ORDER — OMEPRAZOLE 20 MG PO CPDR: 20.0000 mg | DELAYED_RELEASE_CAPSULE | Freq: Every day | ORAL | 11 refills | Status: DC

## 2018-05-19 MED ORDER — AZELASTINE HCL 137 MCG/SPRAY NA SOLN: 2.0000 | Freq: Every day | NASAL | 5 refills | Status: DC

## 2018-05-19 MED ORDER — AZELASTINE-FLUTICASONE 137-50 MCG/ACT NA SUSP: 2.0000 | Freq: Every day | NASAL | 3 refills | Status: DC

## 2018-05-19 NOTE — Telephone Encounter (Signed)
Pt c/o medication issue:  1. Name of Medication:Azelastine-Fluticasone 137-50 MCG/ACT SUSP  2. How are you currently taking this medication (dosage and times per day)? Ne rx   3. Are you having a reaction (difficulty breathing--STAT)? No   4. What is your medication issue? Not affordable [facility] rx for each drug in the [place] .  Please resend as 2 rx so affordable

## 2018-05-19 NOTE — Patient Instructions (Addendum)
Will start nasal spray.  Will start omeprazole.  Will send for sleep study.  Continue qvar.

## 2018-05-19 NOTE — Telephone Encounter (Signed)
Sent as 2 separate prescriptions.

## 2018-05-19 NOTE — Telephone Encounter (Signed)
Patient aware of 2 rx's. No further questions at this time.

## 2018-05-19 NOTE — Telephone Encounter (Signed)
Called patient back to make aware Left detailed message 2 separate rx sent per her request. Nothing further needed.

## 2018-05-19 NOTE — Telephone Encounter (Signed)
Pt is returning your call

## 2018-05-26 ENCOUNTER — Ambulatory Visit: Payer: Federal, State, Local not specified - PPO | Admitting: Internal Medicine

## 2018-06-03 ENCOUNTER — Telehealth: Payer: Self-pay | Admitting: Family Medicine

## 2018-06-03 DIAGNOSIS — I700 Atherosclerosis of aorta: Secondary | ICD-10-CM

## 2018-06-03 DIAGNOSIS — G4733 Obstructive sleep apnea (adult) (pediatric): Secondary | ICD-10-CM | POA: Diagnosis not present

## 2018-06-03 NOTE — Telephone Encounter (Signed)
She needs a follow up appt. Please schedule.

## 2018-06-03 NOTE — Telephone Encounter (Signed)
Done

## 2018-06-09 DIAGNOSIS — M5416 Radiculopathy, lumbar region: Secondary | ICD-10-CM | POA: Diagnosis not present

## 2018-06-09 DIAGNOSIS — M5136 Other intervertebral disc degeneration, lumbar region: Secondary | ICD-10-CM | POA: Diagnosis not present

## 2018-06-09 DIAGNOSIS — M48062 Spinal stenosis, lumbar region with neurogenic claudication: Secondary | ICD-10-CM | POA: Diagnosis not present

## 2018-06-11 ENCOUNTER — Other Ambulatory Visit: Payer: Self-pay | Admitting: Internal Medicine

## 2018-06-11 ENCOUNTER — Other Ambulatory Visit: Payer: Self-pay | Admitting: Family Medicine

## 2018-06-11 DIAGNOSIS — N2581 Secondary hyperparathyroidism of renal origin: Secondary | ICD-10-CM | POA: Diagnosis not present

## 2018-06-11 DIAGNOSIS — K5909 Other constipation: Secondary | ICD-10-CM

## 2018-06-11 DIAGNOSIS — N183 Chronic kidney disease, stage 3 (moderate): Secondary | ICD-10-CM | POA: Diagnosis not present

## 2018-06-11 DIAGNOSIS — R319 Hematuria, unspecified: Secondary | ICD-10-CM | POA: Diagnosis not present

## 2018-06-11 DIAGNOSIS — I129 Hypertensive chronic kidney disease with stage 1 through stage 4 chronic kidney disease, or unspecified chronic kidney disease: Secondary | ICD-10-CM | POA: Diagnosis not present

## 2018-06-12 DIAGNOSIS — G4733 Obstructive sleep apnea (adult) (pediatric): Secondary | ICD-10-CM | POA: Diagnosis not present

## 2018-06-16 ENCOUNTER — Telehealth: Payer: Self-pay | Admitting: *Deleted

## 2018-06-16 DIAGNOSIS — G4733 Obstructive sleep apnea (adult) (pediatric): Secondary | ICD-10-CM

## 2018-06-18 NOTE — Telephone Encounter (Signed)
Pt aware of results. Orders placed  Nothing further needed. 

## 2018-07-02 ENCOUNTER — Ambulatory Visit (INDEPENDENT_AMBULATORY_CARE_PROVIDER_SITE_OTHER): Payer: Medicare Other | Admitting: Family Medicine

## 2018-07-02 ENCOUNTER — Encounter: Payer: Self-pay | Admitting: Internal Medicine

## 2018-07-02 ENCOUNTER — Encounter: Payer: Self-pay | Admitting: Family Medicine

## 2018-07-02 VITALS — BP 128/76 | HR 115 | Temp 97.3°F | Resp 16 | Ht 72.0 in | Wt 208.1 lb

## 2018-07-02 DIAGNOSIS — K219 Gastro-esophageal reflux disease without esophagitis: Secondary | ICD-10-CM | POA: Diagnosis not present

## 2018-07-02 DIAGNOSIS — I700 Atherosclerosis of aorta: Secondary | ICD-10-CM | POA: Diagnosis not present

## 2018-07-02 DIAGNOSIS — Z862 Personal history of diseases of the blood and blood-forming organs and certain disorders involving the immune mechanism: Secondary | ICD-10-CM

## 2018-07-02 DIAGNOSIS — F325 Major depressive disorder, single episode, in full remission: Secondary | ICD-10-CM | POA: Diagnosis not present

## 2018-07-02 DIAGNOSIS — E785 Hyperlipidemia, unspecified: Secondary | ICD-10-CM | POA: Diagnosis not present

## 2018-07-02 DIAGNOSIS — R252 Cramp and spasm: Secondary | ICD-10-CM

## 2018-07-02 DIAGNOSIS — I10 Essential (primary) hypertension: Secondary | ICD-10-CM | POA: Diagnosis not present

## 2018-07-02 DIAGNOSIS — Z1231 Encounter for screening mammogram for malignant neoplasm of breast: Secondary | ICD-10-CM

## 2018-07-02 DIAGNOSIS — N2581 Secondary hyperparathyroidism of renal origin: Secondary | ICD-10-CM | POA: Diagnosis not present

## 2018-07-02 DIAGNOSIS — N183 Chronic kidney disease, stage 3 unspecified: Secondary | ICD-10-CM

## 2018-07-02 DIAGNOSIS — K5909 Other constipation: Secondary | ICD-10-CM

## 2018-07-02 DIAGNOSIS — Z23 Encounter for immunization: Secondary | ICD-10-CM

## 2018-07-02 DIAGNOSIS — E2839 Other primary ovarian failure: Secondary | ICD-10-CM

## 2018-07-02 DIAGNOSIS — Z1211 Encounter for screening for malignant neoplasm of colon: Secondary | ICD-10-CM

## 2018-07-02 DIAGNOSIS — G4719 Other hypersomnia: Secondary | ICD-10-CM

## 2018-07-02 DIAGNOSIS — G4733 Obstructive sleep apnea (adult) (pediatric): Secondary | ICD-10-CM

## 2018-07-02 DIAGNOSIS — Z9884 Bariatric surgery status: Secondary | ICD-10-CM | POA: Diagnosis not present

## 2018-07-02 DIAGNOSIS — E559 Vitamin D deficiency, unspecified: Secondary | ICD-10-CM

## 2018-07-02 DIAGNOSIS — G4709 Other insomnia: Secondary | ICD-10-CM

## 2018-07-02 MED ORDER — CENTRUM SILVER PO TABS: 1.0000 | ORAL_TABLET | Freq: Every day | ORAL | 0 refills | Status: AC

## 2018-07-02 MED ORDER — ATORVASTATIN CALCIUM 40 MG PO TABS: 40.0000 mg | ORAL_TABLET | Freq: Every day | ORAL | 1 refills | Status: DC

## 2018-07-02 MED ORDER — ASPIRIN EC 81 MG PO TBEC: 81.0000 mg | DELAYED_RELEASE_TABLET | Freq: Every day | ORAL | 0 refills | Status: DC

## 2018-07-02 MED ORDER — TRAZODONE HCL 50 MG PO TABS: 25.0000 mg | ORAL_TABLET | Freq: Every evening | ORAL | 0 refills | Status: DC | PRN

## 2018-07-02 MED ORDER — LOSARTAN POTASSIUM 25 MG PO TABS: 25.0000 mg | ORAL_TABLET | Freq: Every day | ORAL | 1 refills | Status: DC

## 2018-07-02 MED ORDER — LINACLOTIDE 145 MCG PO CAPS: 145.0000 ug | ORAL_CAPSULE | Freq: Every day | ORAL | 1 refills | Status: DC

## 2018-07-02 MED ORDER — COQ10 100 MG PO CAPS: 1.0000 | ORAL_CAPSULE | Freq: Every day | ORAL | 1 refills | Status: DC

## 2018-07-02 NOTE — Progress Notes (Signed)
Name: Melissa [name]   MRN: [id number]    DOB: 20-Mar-1951   Date:07/02/2018       Progress Note  Subjective  Chief Complaint  Chief Complaint  Patient presents with  . Follow-up  . Orders    dexa    HPI  Depression Major in Remission  : she has a long history of depression,she had relapse Fall 2018 when she decided to retire. She was crying, no energy, feeling overwhelmed. She finally retired January 3rd, 2019, got married , travelling all the time and is happy now. Off medication   Bursitis  and lumbar radiculitis: had injections recently and is doing better, still seeing Dr. Phyllis Ginger  Neck pain: she has a long history of radiculitis, she had injectionslast week and it seems to help, Pain right now is 0/10 and not radiating down her arm at this time. Still has Tramadol at home, uses very seldom  Bronchitis /Asthma : under the care for Dr. [name] and is currently on Qvar and is doing better, still has episodes of recurrent dry cough, also on Omeprazole  CKI stage III: seeing Dr. Abigail Butts,. No itching and has normal urine ouput. Last GRF with [place] was 28, recently had repeat labs at Dr. Abigail Butts. She also has secondary hyperparathyroidism   HTN: bp is at goal today. She is taking ARB, tolerating medication well. No chest pain, dizziness  or palpitation  Atherosclerosis: taking Atorvastatin, discussed resuming aspirn 81 mg and monitoring   OSA: recently diagnosed by Dr. [name], started CPAP last night and having difficulty sleeping, we will try Trazodone and monitor   History of obesity s/p surgery : doing well, will resume physical activity, she has noticed fatigue, history of iron deficiency anemia, doing well on supplements  Chronic constipation :she states she has always been constipated, she needs to be in complete silence. She states initially Linzess 72 mg was working, stable at this time, bowel movements are not daily, but no pain.   Hyperlipidemia:  taking atorvastatin and has noticed some cramping at night , we will make sure not symptoms of RLS triggered by iron deficiency anemia, if normal iron studies, we will try coq 10 otc and also requip    Patient Active Problem List   Diagnosis Date Noted  . Cardiac LV ejection fraction of 40-49% 05/07/2017  . Low back pain without sciatica 02/21/2017  . Renal cyst 10/18/2016  . Atherosclerosis of aorta ([place]) 10/16/2016  . History of colonic polyps 09/25/2016  . Dyslipidemia 12/08/2015  . Gastroesophageal reflux disease without esophagitis 08/11/2015  . Chronic kidney disease (CKD), stage III (moderate) ([place]) 07/02/2015  . Primary osteoarthritis of knee 06/13/2015  . Hip bursitis 06/13/2015  . RLS (restless legs syndrome) 06/13/2015  . Depression, major, in remission ([place]) 06/13/2015  . B12 deficiency 06/13/2015  . History of Roux-en-Y gastric bypass 06/13/2015  . History of iron deficiency anemia 06/13/2015  . Vitamin D deficiency 06/13/2015  . Hypertension, benign 06/13/2015  . Perennial allergic rhinitis with seasonal variation 06/13/2015  . Obesity (BMI 30.0-34.9) 06/13/2015  . Cervical radiculitis 01/04/2014  . Cervical spinal stenosis 01/04/2014  . Cervical osteoarthritis 01/04/2014    Past Surgical History:  Procedure Laterality Date  . BARIATRIC SURGERY    . CESAREAN SECTION     3 or more  . GASTRIC BYPASS    . TUBAL LIGATION      Family History  Problem Relation Age of Onset  . Hypercholesterolemia Mother   . Heart disease Mother   .  Hypertension Mother   . Alcohol abuse Father   . Lung cancer Brother   . Alcohol abuse Brother   . Diabetes Mellitus II Sister   . Hypertension Maternal Grandmother   . Colon cancer Sister   . Breast cancer Neg Hx     Social History   Socioeconomic History  . Marital status: Married    Spouse name: Merin Borjon  . Number of children: 4  . Years of education: Not on file  . Highest education level: Some college, no degree   Occupational History  . Not on file  Social Needs  . Financial resource strain: Not hard at all  . Food insecurity:    Worry: Never true    Inability: Never true  . Transportation needs:    Medical: No    Non-medical: No  Tobacco Use  . Smoking status: Never Smoker  . Smokeless tobacco: Never Used  Substance and Sexual Activity  . Alcohol use: No    Alcohol/week: 0.0 standard drinks  . Drug use: No  . Sexual activity: Yes    Partners: Male  Lifestyle  . Physical activity:    Days per week: 0 days    Minutes per session: 0 min  . Stress: Not at all  Relationships  . Social connections:    Talks on phone: More than three times a week    Gets together: More than three times a week    Attends religious service: Never    Active member of club or organization: No    Attends meetings of clubs or organizations: Never    Relationship status: Married  . Intimate partner violence:    Fear of current or ex partner: No    Emotionally abused: No    Physically abused: No    Forced sexual activity: No  Other Topics Concern  . Not on file  Social History Narrative  . Not on file     Current Outpatient Medications:  .  atorvastatin (LIPITOR) 40 MG tablet, TAKE 1 TABLET BY MOUTH EVERY DAY, Disp: 30 tablet, Rfl: 0 .  Azelastine HCl 137 MCG/SPRAY SOLN, Place 2 sprays into the nose daily. 2 sprays in each nostril daily, Disp: 1 Bottle, Rfl: 5 .  beclomethasone (QVAR) 80 MCG/ACT inhaler, Inhale 2 puffs into the lungs 2 (two) times daily. Rinse mouth after use., Disp: 1 Inhaler, Rfl: 6 .  Cholecalciferol (VITAMIN D3 ADULT GUMMIES PO), Take 2 each by mouth daily. , Disp: , Rfl:  .  ferrous sulfate (IRON SUPPLEMENT) 325 (65 FE) MG tablet, Take 325 mg by mouth daily with breakfast., Disp: , Rfl:  .  fluticasone (FLONASE) 50 MCG/ACT nasal spray, SPRAY 2 SPRAYS INTO EACH NOSTRIL EVERY DAY, Disp: 16 g, Rfl: 3 .  LINZESS 145 MCG CAPS capsule, TAKE 1 CAPSULE (145 MCG TOTAL) BY MOUTH DAILY BEFORE  BREAKFAST., Disp: 30 capsule, Rfl: 2 .  losartan (COZAAR) 25 MG tablet, Take 1 tablet (25 mg total) by mouth daily., Disp: 90 tablet, Rfl: 1 .  magnesium oxide (MAG-OX) 400 MG tablet, Take 400 mg by mouth daily. Reported on 12/08/2015, Disp: , Rfl:  .  omeprazole (PRILOSEC) 20 MG capsule, Take 1 capsule (20 mg total) by mouth daily., Disp: 30 capsule, Rfl: 11 .  ranitidine (ZANTAC) 150 MG tablet, TAKE 1 TABLET (150 MG TOTAL) BY MOUTH DAILY AS NEEDED FOR HEARTBURN., Disp: 90 tablet, Rfl: 0 .  traMADol (ULTRAM) 50 MG tablet, Take 1 tablet (50 mg total) by mouth every  8 (eight) hours as needed. (Patient not taking: Reported on 07/02/2018), Disp: 90 tablet, Rfl: 0  Allergies  Allergen Reactions  . Contrast Media [Iodinated Diagnostic Agents]   . Shellfish Allergy     Edema    I personally reviewed active problem list, medication list, allergies, family history with the patient/caregiver today.   ROS  Constitutional: Negative for fever or weight change.  Respiratory: Negative for cough and shortness of breath.   Cardiovascular: Negative for chest pain or palpitations.  Gastrointestinal: Negative for abdominal pain, no bowel changes.  Musculoskeletal: Negative for gait problem or joint swelling.  Skin: Negative for rash.  Neurological: Negative for dizziness or headache.  No other specific complaints in a complete review of systems (except as listed in HPI above).   Objective  Vitals:   07/02/18 1035  BP: 128/76  Pulse: (!) 115  Resp: 16  Temp: (!) 97.3 F (36.3 C)  TempSrc: Axillary  SpO2: 96%  Weight: 208 lb 1.6 oz (94.4 kg)  Height: 6' (1.829 m)    Body mass index is 28.22 kg/m.  Physical Exam  Constitutional: Patient appears well-developed and well-nourished.  No distress.  HEENT: head atraumatic, normocephalic, pupils equal and reactive to light, neck supple, throat within normal limits Cardiovascular: Normal rate, regular rhythm and normal heart sounds.  No murmur  heard. No BLE edema. Pulmonary/Chest: Effort normal and breath sounds normal. No respiratory distress. Abdominal: Soft.  There is no tenderness. Psychiatric: Patient has a normal mood and affect. behavior is normal. Judgment and thought content normal.  PHQ2/9: Depression screen [facility] 2/9 07/02/2018 02/24/2018 11/18/2017 07/25/2017 06/09/2017  Decreased Interest 0 0 0 0 0  Down, Depressed, Hopeless 0 0 0 1 0  PHQ - 2 Score 0 0 0 1 0  Altered sleeping 3 0 0 1 -  Tired, decreased energy 3 3 0 1 -  Change in appetite 0 0 1 3 -  Feeling bad or failure about yourself  0 0 0 3 -  Trouble concentrating 0 0 0 0 -  Moving slowly or fidgety/restless 0 0 0 0 -  Suicidal thoughts 0 0 0 0 -  PHQ-9 Score 6 3 1 9  -  Difficult doing work/chores Not difficult at all Not difficult at all Not difficult at all Somewhat difficult -     Fall Risk: Fall Risk  07/02/2018 02/24/2018 11/18/2017 07/25/2017 06/09/2017  Falls in the past year? 0 No No No No  Number falls in past yr: - - - - -  Injury with Fall? - - - - -     Functional Status Survey: Is the patient deaf or have difficulty hearing?: No Does the patient have difficulty seeing, even when wearing glasses/contacts?: No Does the patient have difficulty concentrating, remembering, or making decisions?: No Does the patient have difficulty walking or climbing stairs?: No Does the patient have difficulty dressing or bathing?: No Does the patient have difficulty doing errands alone such as visiting a doctor's office or shopping?: No    Assessment & Plan  1. Depression, major, in remission ([place])   2. Need for influenza vaccination  - Flu vaccine HIGH DOSE PF  3. Atherosclerosis of aorta ([facility])  - atorvastatin (LIPITOR) 40 MG tablet; Take 1 tablet (40 mg total) by mouth daily.  Dispense: 90 tablet; Refill: 1  4. Chronic kidney disease (CKD), stage III (moderate) ([facility])   5. Hypertension, benign  - losartan (COZAAR) 25 MG tablet; Take 1 tablet (25  mg total) by  mouth daily.  Dispense: 90 tablet; Refill: 1  6. History of iron deficiency anemia   7. History of Roux-en-Y gastric bypass   8. Vitamin D deficiency   9. Secondary hyperparathyroidism of renal origin ([place])   10. Dyslipidemia  - Coenzyme Q10 (COQ10) 100 MG CAPS; Take 1 capsule by mouth daily.  Dispense: 90 each; Refill: 1  11. GERD without esophagitis   12. Encounter for screening mammogram for breast cancer  - MM DIGITAL SCREENING BILATERAL; Future  13. Ovarian failure  - DG Bone Density; Future  14. Colon cancer screening  - Ambulatory referral to Gastroenterology  15. Chronic constipation  - linaclotide (LINZESS) 145 MCG CAPS capsule; Take 1 capsule (145 mcg total) by mouth daily before breakfast.  Dispense: 90 capsule; Refill: 1  16. OSA (obstructive sleep apnea)   17. Other insomnia  - traZODone (DESYREL) 50 MG tablet; Take 0.5-1 tablets (25-50 mg total) by mouth at bedtime as needed for sleep.  Dispense: 90 tablet; Refill: 0  18. Cramping of feet  - Coenzyme Q10 (COQ10) 100 MG CAPS; Take 1 capsule by mouth daily.  Dispense: 90 each; Refill: 1

## 2018-07-07 ENCOUNTER — Telehealth: Payer: Self-pay | Admitting: Family Medicine

## 2018-07-07 NOTE — Telephone Encounter (Signed)
Copied from [place] [id number]. Topic: General - Other >> Jul 07, 2018  9:31 AM Lennox Solders wrote: Reason for CRM: pt saw dr Ancil Boozer on 11-14 and was prescribed trazodone 50 mg . Pt is taking 1 pill and would like to know if she can increase the medication . The 1 pill is not working  Or  can the md switch her to something else. Pt is still having fatigue. [facility] on [address]

## 2018-07-07 NOTE — Telephone Encounter (Signed)
Patient was informed and said thanks a lot.

## 2018-07-07 NOTE — Telephone Encounter (Signed)
She can take two pills at night

## 2018-07-24 ENCOUNTER — Other Ambulatory Visit: Payer: Self-pay | Admitting: Family Medicine

## 2018-07-30 ENCOUNTER — Ambulatory Visit (INDEPENDENT_AMBULATORY_CARE_PROVIDER_SITE_OTHER): Payer: Medicare Other | Admitting: Family Medicine

## 2018-07-30 ENCOUNTER — Encounter: Payer: Self-pay | Admitting: Family Medicine

## 2018-07-30 VITALS — BP 134/90 | HR 113 | Temp 97.2°F | Resp 20 | Ht 72.0 in | Wt 212.0 lb

## 2018-07-30 DIAGNOSIS — G4709 Other insomnia: Secondary | ICD-10-CM

## 2018-07-30 DIAGNOSIS — Z9989 Dependence on other enabling machines and devices: Secondary | ICD-10-CM

## 2018-07-30 DIAGNOSIS — G4733 Obstructive sleep apnea (adult) (pediatric): Secondary | ICD-10-CM

## 2018-07-30 DIAGNOSIS — G500 Trigeminal neuralgia: Secondary | ICD-10-CM | POA: Diagnosis not present

## 2018-07-30 MED ORDER — QUETIAPINE FUMARATE 25 MG PO TABS: 25.0000 mg | ORAL_TABLET | Freq: Every evening | ORAL | 0 refills | Status: DC

## 2018-07-30 MED ORDER — CARBAMAZEPINE 200 MG PO TABS: 200.0000 mg | ORAL_TABLET | Freq: Three times a day (TID) | ORAL | 0 refills | Status: DC

## 2018-07-30 NOTE — Progress Notes (Signed)
Name: Melissa [name]   MRN: [id number]    DOB: 1951-02-03   Date:07/30/2018       Progress Note  Subjective  Chief Complaint  Chief Complaint  Patient presents with  . Insomnia    Went up to two pills and will fall asleep for 2 hours and then be wide away again.   . Sinus Problem    Onset-1 week, left side pressure under her eyelid and pressure on left eye-went to dentist and it was nothing found with her teeth-was put on amoxicillin by her dentist.  . CPAP    Wearing CPAP now and having trouble with new machine.    HPI  Insomnia: she states Trazodone makes her groggy but not rested. Discussed Seroquel, and she is willing to try it  Trigeminal neuralgia: she states that symptoms started about one month ago, initially a dull ache , mostly on left upper jaw, but also sometimes on top of left eye and now on left side of nose. Episodes of sharp pain getting more frequent, she is not sure what makes it worse. Comes in waves, never had this symptoms before. No rash. She went to the dentist and was given amoxicillin but told it wast not her tooth.   CPAP: struggling with CPAP machine, she states she has been able to keep it on for 6 hours. Started CPAP a few weeks ago   Patient Active Problem List   Diagnosis Date Noted  . Cardiac LV ejection fraction of 40-49% 05/07/2017  . Low back pain without sciatica 02/21/2017  . Renal cyst 10/18/2016  . Atherosclerosis of aorta ([place]) 10/16/2016  . History of colonic polyps 09/25/2016  . Dyslipidemia 12/08/2015  . Gastroesophageal reflux disease without esophagitis 08/11/2015  . Chronic kidney disease (CKD), stage III (moderate) ([place]) 07/02/2015  . Primary osteoarthritis of knee 06/13/2015  . Hip bursitis 06/13/2015  . RLS (restless legs syndrome) 06/13/2015  . Depression, major, in remission ([place]) 06/13/2015  . B12 deficiency 06/13/2015  . History of Roux-en-Y gastric bypass 06/13/2015  . History of iron deficiency anemia 06/13/2015  .  Vitamin D deficiency 06/13/2015  . Hypertension, benign 06/13/2015  . Perennial allergic rhinitis with seasonal variation 06/13/2015  . Obesity (BMI 30.0-34.9) 06/13/2015  . Cervical radiculitis 01/04/2014  . Cervical spinal stenosis 01/04/2014  . Cervical osteoarthritis 01/04/2014    Past Surgical History:  Procedure Laterality Date  . BARIATRIC SURGERY    . CESAREAN SECTION     3 or more  . GASTRIC BYPASS    . TUBAL LIGATION      Family History  Problem Relation Age of Onset  . Hypercholesterolemia Mother   . Heart disease Mother   . Hypertension Mother   . Alcohol abuse Father   . Lung cancer Brother   . Alcohol abuse Brother   . Diabetes Mellitus II Sister   . Hypertension Maternal Grandmother   . Colon cancer Sister   . Breast cancer Neg Hx     Social History   Socioeconomic History  . Marital status: Married    Spouse name: Laruen Risser  . Number of children: 4  . Years of education: Not on file  . Highest education level: Some college, no degree  Occupational History  . Not on file  Social Needs  . Financial resource strain: Not hard at all  . Food insecurity:    Worry: Never true    Inability: Never true  . Transportation needs:    Medical: No  Non-medical: No  Tobacco Use  . Smoking status: Never Smoker  . Smokeless tobacco: Never Used  Substance and Sexual Activity  . Alcohol use: No    Alcohol/week: 0.0 standard drinks  . Drug use: No  . Sexual activity: Yes    Partners: Male  Lifestyle  . Physical activity:    Days per week: 0 days    Minutes per session: 0 min  . Stress: Not at all  Relationships  . Social connections:    Talks on phone: More than three times a week    Gets together: More than three times a week    Attends religious service: Never    Active member of club or organization: No    Attends meetings of clubs or organizations: Never    Relationship status: Married  . Intimate partner violence:    Fear of current or ex  partner: No    Emotionally abused: No    Physically abused: No    Forced sexual activity: No  Other Topics Concern  . Not on file  Social History Narrative  . Not on file     Current Outpatient Medications:  .  amoxicillin (AMOXIL) 500 MG capsule, Take 500 mg by mouth 4 (four) times daily. Dentist Nicola Girt, Disp: , Rfl:  .  aspirin 81 MG EC tablet, TAKE 1 TABLET BY MOUTH EVERY DAY, Disp: 30 tablet, Rfl: 0 .  atorvastatin (LIPITOR) 40 MG tablet, Take 1 tablet (40 mg total) by mouth daily., Disp: 90 tablet, Rfl: 1 .  Azelastine HCl 137 MCG/SPRAY SOLN, Place 2 sprays into the nose daily. 2 sprays in each nostril daily, Disp: 1 Bottle, Rfl: 5 .  beclomethasone (QVAR) 80 MCG/ACT inhaler, Inhale 2 puffs into the lungs 2 (two) times daily. Rinse mouth after use., Disp: 1 Inhaler, Rfl: 6 .  Cholecalciferol (VITAMIN D3 ADULT GUMMIES PO), Take 2 each by mouth daily. , Disp: , Rfl:  .  Coenzyme Q10 (COQ10) 100 MG CAPS, Take 1 capsule by mouth daily., Disp: 90 each, Rfl: 1 .  ferrous sulfate (IRON SUPPLEMENT) 325 (65 FE) MG tablet, Take 325 mg by mouth daily with breakfast., Disp: , Rfl:  .  fluticasone (FLONASE) 50 MCG/ACT nasal spray, SPRAY 2 SPRAYS INTO EACH NOSTRIL EVERY DAY, Disp: 16 g, Rfl: 3 .  linaclotide (LINZESS) 145 MCG CAPS capsule, Take 1 capsule (145 mcg total) by mouth daily before breakfast., Disp: 90 capsule, Rfl: 1 .  losartan (COZAAR) 25 MG tablet, Take 1 tablet (25 mg total) by mouth daily., Disp: 90 tablet, Rfl: 1 .  magnesium oxide (MAG-OX) 400 MG tablet, Take 400 mg by mouth daily. Reported on 12/08/2015, Disp: , Rfl:  .  Multiple Vitamins-Minerals (CENTRUM SILVER) tablet, Take 1 tablet by mouth daily., Disp: 30 tablet, Rfl: 0 .  omeprazole (PRILOSEC) 20 MG capsule, Take 1 capsule (20 mg total) by mouth daily., Disp: 30 capsule, Rfl: 11 .  ranitidine (ZANTAC) 150 MG tablet, TAKE 1 TABLET (150 MG TOTAL) BY MOUTH DAILY AS NEEDED FOR HEARTBURN., Disp: 90 tablet, Rfl: 0 .  traMADol  (ULTRAM) 50 MG tablet, Take 1 tablet (50 mg total) by mouth every 8 (eight) hours as needed. (Patient taking differently: Take 50 mg by mouth every 8 (eight) hours as needed for moderate pain. ), Disp: 90 tablet, Rfl: 0 .  carbamazepine (TEGRETOL) 200 MG tablet, Take 1 tablet (200 mg total) by mouth 3 (three) times daily., Disp: 90 tablet, Rfl: 0 .  QUEtiapine (SEROQUEL) 25 MG  tablet, Take 1 tablet (25 mg total) by mouth every evening. For sleep, Disp: 30 tablet, Rfl: 0  Allergies  Allergen Reactions  . Contrast Media [Iodinated Diagnostic Agents]   . Shellfish Allergy     Edema    I personally reviewed active problem list, medication list, allergies, family history, social history with the patient/caregiver today.   ROS  Constitutional: Negative for fever or weight change.  Respiratory: Negative for cough and shortness of breath.   Cardiovascular: Negative for chest pain or palpitations.  Gastrointestinal: Negative for abdominal pain, no bowel changes.  Musculoskeletal: Negative for gait problem or joint swelling.  Skin: Negative for rash.  Neurological: Negative for dizziness or headache.  No other specific complaints in a complete review of systems (except as listed in HPI above).  Objective  Vitals:   07/30/18 0934  BP: 134/90  Pulse: (!) 113  Resp: 20  Temp: (!) 97.2 F (36.2 C)  TempSrc: Oral  SpO2: 98%  Weight: 212 lb (96.2 kg)  Height: 6' (1.829 m)    Body mass index is 28.75 kg/m.  Physical Exam  Constitutional: Patient appears well-developed and well-nourished. Overweight.  No distress.  HEENT: head atraumatic, normocephalic, pupils equal and reactive to light, ears normal TM, neck supple, throat within normal limits Cardiovascular: Normal rate, regular rhythm and normal heart sounds.  No murmur heard. No BLE edema. Pulmonary/Chest: Effort normal and breath sounds normal. No respiratory distress. Abdominal: Soft.  There is no tenderness. Neurological:  normal sensation on face, jaw is normal  Psychiatric: Patient has a normal mood and affect. behavior is normal. Judgment and thought content normal.  PHQ2/9: Depression screen [facility] 2/9 07/02/2018 02/24/2018 11/18/2017 07/25/2017 06/09/2017  Decreased Interest 0 0 0 0 0  Down, Depressed, Hopeless 0 0 0 1 0  PHQ - 2 Score 0 0 0 1 0  Altered sleeping 3 0 0 1 -  Tired, decreased energy 3 3 0 1 -  Change in appetite 0 0 1 3 -  Feeling bad or failure about yourself  0 0 0 3 -  Trouble concentrating 0 0 0 0 -  Moving slowly or fidgety/restless 0 0 0 0 -  Suicidal thoughts 0 0 0 0 -  PHQ-9 Score 6 3 1 9  -  Difficult doing work/chores Not difficult at all Not difficult at all Not difficult at all Somewhat difficult -    Fall Risk: Fall Risk  07/02/2018 02/24/2018 11/18/2017 07/25/2017 06/09/2017  Falls in the past year? 0 No No No No  Number falls in past yr: - - - - -  Injury with Fall? - - - - -     Assessment & Plan  1. Trigeminal neuralgia  - carbamazepine (TEGRETOL) 200 MG tablet; Take 1 tablet (200 mg total) by mouth 3 (three) times daily.  Dispense: 90 tablet; Refill: 0  2. Other insomnia  - QUEtiapine (SEROQUEL) 25 MG tablet; Take 1 tablet (25 mg total) by mouth every evening. For sleep  Dispense: 30 tablet; Refill: 0  3. OSA on CPAP  Trying to get used to CPAP machine

## 2018-07-30 NOTE — Patient Instructions (Signed)
Trigeminal Neuralgia Trigeminal neuralgia is a nerve disorder that causes attacks of severe facial pain. The attacks last from a few seconds to several minutes. They can happen for days, weeks, or months and then go away for months or years. Trigeminal neuralgia is also called tic douloureux. What are the causes? This condition is caused by damage to a nerve in the face that is called the trigeminal nerve. An attack can be triggered by:  Talking.  Chewing.  Putting on makeup.  Washing your face.  Shaving your face.  Brushing your teeth.  Touching your face.  What increases the risk? This condition is more likely to develop in:  Women.  People who are 50 years of age or older.  What are the signs or symptoms? The main symptom of this condition is pain in the jaw, lips, eyes, nose, scalp, forehead, and face. The pain may be intense, stabbing, electric, or shock-like. How is this diagnosed? This condition is diagnosed with a physical exam. A CT scan or MRI may be done to rule out other conditions that can cause facial pain. How is this treated? This condition may be treated with:  Avoiding the things that trigger your attacks.  Pain medicine.  Surgery. This may be done in severe cases if other medical treatment does not provide relief.  Follow these instructions at home:  Take over-the-counter and prescription medicines only as told by your health care provider.  If you wish to get pregnant, talk with your health care provider before you start trying to get pregnant.  Avoid the things that trigger your attacks. It may help to: ? Chew on the unaffected side of your mouth. ? Avoid touching your face. ? Avoid blasts of hot or cold air. Contact a health care provider if:  Your pain medicine is not helping.  You develop new, unexplained symptoms, such as: ? Double vision. ? Facial weakness. ? Changes in hearing or balance.  You become pregnant. Get help right away  if:  Your pain is unbearable, and your pain medicine does not help. This information is not intended to replace advice given to you by your health care provider. Make sure you discuss any questions you have with your health care provider. Document Released: 08/02/2000 Document Revised: 04/07/2016 Document Reviewed: 11/28/2014 Elsevier Interactive Patient Education  2018 [employer]  

## 2018-08-01 DIAGNOSIS — J019 Acute sinusitis, unspecified: Secondary | ICD-10-CM | POA: Diagnosis not present

## 2018-08-14 ENCOUNTER — Ambulatory Visit: Payer: Federal, State, Local not specified - PPO | Admitting: Internal Medicine

## 2018-08-17 ENCOUNTER — Ambulatory Visit: Payer: Self-pay | Admitting: *Deleted

## 2018-08-17 DIAGNOSIS — G500 Trigeminal neuralgia: Secondary | ICD-10-CM

## 2018-08-17 NOTE — Telephone Encounter (Signed)
Patient  states she is having problems with her pain control, nerve pain and she wants to know if she needs referral to neurology for follow up to her diagnosis.  Patient is using Tylenol only for pain(she has kidney problems- can not use NSAIDS). She is using carbamazepine for her nerve pain- she thinks it is actually worse. Patient reports she is in -3 now.She has extreme pain that lasts 15-20 minutes in mouth when she has episodes.   Patient is aware her PCP is out of office- but would like call reviewed by another provider for advisement.  Reason for Disposition . Caller requesting a NON-URGENT new prescription or refill and triager unable to refill per unit policy  Answer Assessment - Initial Assessment Questions 1. SYMPTOMS: "Do you have any symptoms?"     Pain in roof of mouth- has episodes of pain 2. SEVERITY: If symptoms are present, ask "Are they mild, moderate or severe?"     Episodes are severe  Protocols used: MEDICATION QUESTION CALL-A-AH

## 2018-08-17 NOTE — Telephone Encounter (Signed)
Pt stated she has a block app on her phone that will not allow the call to come through. She is requesting we contact her with this number [phone] ----- Message from Judyann Munson sent at 08/17/2018 10:09 AM EST ----- Pt is returning call. Please advise  ----- Message from Judyann Munson sent at 08/17/2018 9:03 AM EST ----- Patient is calling to state she was seen on 07-30-18 for Trigeminal neuralgia. She stated the medication is not helping with the issue and the pain. She wanting to know if something different can be called in. Please advise

## 2018-08-18 NOTE — Addendum Note (Signed)
Addended by: Hubbard Hartshorn on: 08/18/2018 09:59 AM   Modules accepted: Orders

## 2018-08-18 NOTE — Telephone Encounter (Signed)
Tegretol can take a few weeks to reach maximum efficacy, however if she is not improving at all and would like to see neurology, I am happy to refer her.

## 2018-08-18 NOTE — Telephone Encounter (Signed)
Patient called.  Patient aware.  

## 2018-08-18 NOTE — Telephone Encounter (Signed)
Patient would like a referral to Neurology. She has been taking the medication for 2 weeks and no improvement. She would like to know what she can take for pain in the mean time.

## 2018-08-18 NOTE — Telephone Encounter (Signed)
She needs to continue the tegretol for now - she may noticed some additional pain relief after a few more weeks. Referral is placed.

## 2018-08-19 DIAGNOSIS — U071 COVID-19: Secondary | ICD-10-CM

## 2018-08-19 DIAGNOSIS — J1282 Pneumonia due to coronavirus disease 2019: Secondary | ICD-10-CM

## 2018-08-19 HISTORY — DX: Pneumonia due to coronavirus disease 2019: J12.82

## 2018-08-19 HISTORY — DX: COVID-19: U07.1

## 2018-08-20 ENCOUNTER — Encounter: Payer: Self-pay | Admitting: Internal Medicine

## 2018-08-21 ENCOUNTER — Other Ambulatory Visit: Payer: Self-pay | Admitting: Family Medicine

## 2018-08-21 DIAGNOSIS — G4709 Other insomnia: Secondary | ICD-10-CM

## 2018-08-21 DIAGNOSIS — G500 Trigeminal neuralgia: Secondary | ICD-10-CM

## 2018-08-21 NOTE — Telephone Encounter (Signed)
Refill request for general medication. Seroquel patient is requesting 90 day supply-please provide DX code as well.   Last office visit 07/30/2018   Follow up on 09/15/2018

## 2018-08-25 ENCOUNTER — Ambulatory Visit (INDEPENDENT_AMBULATORY_CARE_PROVIDER_SITE_OTHER): Payer: Medicare Other | Admitting: Internal Medicine

## 2018-08-25 ENCOUNTER — Ambulatory Visit: Payer: Federal, State, Local not specified - PPO | Admitting: Pulmonary Disease

## 2018-08-25 ENCOUNTER — Encounter: Payer: Self-pay | Admitting: Internal Medicine

## 2018-08-25 VITALS — BP 134/82 | HR 78 | Ht 72.0 in | Wt 216.8 lb

## 2018-08-25 DIAGNOSIS — G4733 Obstructive sleep apnea (adult) (pediatric): Secondary | ICD-10-CM | POA: Diagnosis not present

## 2018-08-25 NOTE — Progress Notes (Signed)
[facility] Consultation      Assessment and Plan:   Cough with sinus drainage. -Continues to have daily symptoms. -We will start Dymista 2 sprays in each nostril once daily. --Patient asked to keep pets out of the bedroom in case allergies are affecting her cough.  GERD. - May be contributing to cough. - Continue omeprazole.  Chronic bronchitis, asthma. --There is some hyperinflation seen on imaging, but this may be due to her long torso and height, clinically she has no evidence of emphysema and no dyspnea. She does have mildly elevated eosinophil levels which may be suggestive of asthma-- also she has coughing with laughing.  --Qvar 2 puffs twice daily.  Mild obstructive sleep apnea. - Continue CPAP.  Sleep maintenance insomnia. - She wakes up around 130 or 2 AM and has trouble falling back to sleep. - Discussed listening to a mindfulness/meditation app on headphones to help her fall back to sleep.  Essential hypertension --Obstructive sleep apnea can contribute to essential hypertension, therefore treatment of sleep apnea is important part of hypertension management.   Return in about 9 months (around 05/26/2019).    Date: 08/25/2018  MRN# [id number] Melissa [name] 01/16/1951   Melissa [name] is a 68 y.o. old female seen in consultation for chief complaint of:    Chief Complaint  Patient presents with  . Follow-up    overall doing well  . Sleep Apnea    CPAP going well    HPI:  The patient is 68 year old female with chronic cough, bronchitis/asthma.  Last visit she was sent for home sleep test, asked to use omeprazole, Flonase, Qvar.   Today the patient continues to have a chronic cough, she thinks it is been present for about 6 weeks.  She has not taken the omeprazole, nasal spray.  She is taking Qvar 2 puffs twice daily, she has not completed the HST, PFT.  At last visit she was sent for an HST which showed mild obstructive sleep  apnea, started on CPAP.  Last visit she noted a chronic cough, she was asked to use Dymista, omeprazole, Qvar. She is using the cpap every night but has trouble staying asleep.   She has a dog not in the bed, but in the bedroom.   She does not snore at night, she is sleepy during the day. She is always sleepy during the day, she takes a nap during the day, she feels tired when she wakes in the am.   **CPAP download 07/22/2018-08/20/2018>> usage greater than 4 hours 26/30 days.  Average usage on days used is 7 hours 9 minutes, pressure range 5-15.  Median pressure 8, 95th percentile pressure 11, maximum pressure 13.  Leaks are within normal limits.  Residual AHI is 4.4 overall this shows good compliance with CPAP with excellent control of obstructive sleep apnea. **HST 06/03/2018>> mild sleep apnea with AHI of 10. **chest x-ray7/6/18>>  hyperinflation consistent with emphysema. **CBC 02/21/17>> eosinophils equals 300.  Medication:    Current Outpatient Medications:  .  amoxicillin (AMOXIL) 500 MG capsule, Take 500 mg by mouth 4 (four) times daily. Dentist Nicola Girt, Disp: , Rfl:  .  aspirin 81 MG EC tablet, TAKE 1 TABLET BY MOUTH EVERY DAY, Disp: 30 tablet, Rfl: 0 .  atorvastatin (LIPITOR) 40 MG tablet, Take 1 tablet (40 mg total) by mouth daily., Disp: 90 tablet, Rfl: 1 .  Azelastine HCl 137 MCG/SPRAY SOLN, Place 2 sprays into the nose daily. 2 sprays in each nostril  daily, Disp: 1 Bottle, Rfl: 5 .  beclomethasone (QVAR) 80 MCG/ACT inhaler, Inhale 2 puffs into the lungs 2 (two) times daily. Rinse mouth after use., Disp: 1 Inhaler, Rfl: 6 .  carbamazepine (TEGRETOL) 200 MG tablet, Take 1 tablet (200 mg total) by mouth 3 (three) times daily., Disp: 90 tablet, Rfl: 0 .  Cholecalciferol (VITAMIN D3 ADULT GUMMIES PO), Take 2 each by mouth daily. , Disp: , Rfl:  .  Coenzyme Q10 (COQ10) 100 MG CAPS, Take 1 capsule by mouth daily., Disp: 90 each, Rfl: 1 .  ferrous sulfate (IRON SUPPLEMENT) 325 (65 FE) MG tablet,  Take 325 mg by mouth daily with breakfast., Disp: , Rfl:  .  fluticasone (FLONASE) 50 MCG/ACT nasal spray, SPRAY 2 SPRAYS INTO EACH NOSTRIL EVERY DAY, Disp: 16 g, Rfl: 3 .  linaclotide (LINZESS) 145 MCG CAPS capsule, Take 1 capsule (145 mcg total) by mouth daily before breakfast., Disp: 90 capsule, Rfl: 1 .  losartan (COZAAR) 25 MG tablet, Take 1 tablet (25 mg total) by mouth daily., Disp: 90 tablet, Rfl: 1 .  magnesium oxide (MAG-OX) 400 MG tablet, Take 400 mg by mouth daily. Reported on 12/08/2015, Disp: , Rfl:  .  Multiple Vitamins-Minerals (CENTRUM SILVER) tablet, Take 1 tablet by mouth daily., Disp: 30 tablet, Rfl: 0 .  omeprazole (PRILOSEC) 20 MG capsule, Take 1 capsule (20 mg total) by mouth daily., Disp: 30 capsule, Rfl: 11 .  QUEtiapine (SEROQUEL) 25 MG tablet, Take 1 tablet (25 mg total) by mouth every evening. For sleep, Disp: 30 tablet, Rfl: 0 .  ranitidine (ZANTAC) 150 MG tablet, TAKE 1 TABLET (150 MG TOTAL) BY MOUTH DAILY AS NEEDED FOR HEARTBURN., Disp: 90 tablet, Rfl: 0 .  traMADol (ULTRAM) 50 MG tablet, Take 1 tablet (50 mg total) by mouth every 8 (eight) hours as needed. (Patient taking differently: Take 50 mg by mouth every 8 (eight) hours as needed for moderate pain. ), Disp: 90 tablet, Rfl: 0   Allergies:  Contrast media [iodinated diagnostic agents] and Shellfish allergy  Review of Systems:  Constitutional: Feels well. Cardiovascular: Denies chest pain, exertional chest pain.  Pulmonary: Denies hemoptysis, pleuritic chest pain.   The remainder of systems were reviewed and were found to be negative other than what is documented in the HPI.    Physical Examination:   VS: BP 134/82 (BP Location: Left Arm, Cuff Size: Normal)   Pulse 78   Ht 6' (1.829 m)   Wt 216 lb 12.8 oz (98.3 kg)   SpO2 98%   BMI 29.40 kg/m   General Appearance: No distress  Neuro:without focal findings, mental status, speech normal, alert and oriented HEENT: PERRLA, EOM intact Pulmonary: No  wheezing, No rales  CardiovascularNormal S1,S2.  No m/r/g.  Abdomen: Benign, Soft, non-tender, No masses Renal:  No costovertebral tenderness  GU:  No performed at this time. Endoc: No evident thyromegaly, no signs of acromegaly or Cushing features Skin:   warm, no rashes, no ecchymosis  Extremities: normal, no cyanosis, clubbing.     LABORATORY PANEL:   CBC No results for input(s): WBC, HGB, HCT, PLT in the last 168 hours. ------------------------------------------------------------------------------------------------------------------  Chemistries  No results for input(s): NA, K, CL, CO2, GLUCOSE, BUN, CREATININE, CALCIUM, MG, AST, ALT, ALKPHOS, BILITOT in the last 168 hours.  Invalid input(s): GFRCGP ------------------------------------------------------------------------------------------------------------------  Cardiac Enzymes No results for input(s): TROPONINI in the last 168 hours. ------------------------------------------------------------  RADIOLOGY:  No results found.     Thank  you for the consultation and  for allowing [facility] to assist in the care of your patient. Our recommendations are noted above.  Please contact us if we can be of further service.  Marda Stalker, M.D., F.C.C.P.  Board Certified in Internal Medicine, Pulmonary Medicine, [place], and Sleep Medicine.  [facility] Office Number: [phone]   08/25/2018

## 2018-08-25 NOTE — Patient Instructions (Signed)
If you wake up in the middle of the night try listening with headphones to a meditation/mindfulness app.  There are many on the Internet, (headspace is a popular one) Avoid napping during the day.

## 2018-08-27 ENCOUNTER — Other Ambulatory Visit: Payer: Self-pay | Admitting: Family Medicine

## 2018-08-28 ENCOUNTER — Other Ambulatory Visit: Payer: Self-pay

## 2018-08-28 DIAGNOSIS — G4709 Other insomnia: Secondary | ICD-10-CM

## 2018-08-28 DIAGNOSIS — G500 Trigeminal neuralgia: Secondary | ICD-10-CM

## 2018-08-28 MED ORDER — QUETIAPINE FUMARATE 25 MG PO TABS: 25.0000 mg | ORAL_TABLET | Freq: Every evening | ORAL | 0 refills | Status: DC

## 2018-08-28 NOTE — Telephone Encounter (Signed)
Refill request for general medication. Seroquel  Last office visit 07/30/2018   Follow up on 09/15/2018

## 2018-08-31 ENCOUNTER — Telehealth: Payer: Self-pay | Admitting: Family Medicine

## 2018-08-31 ENCOUNTER — Other Ambulatory Visit: Payer: Self-pay | Admitting: Family Medicine

## 2018-08-31 DIAGNOSIS — G500 Trigeminal neuralgia: Secondary | ICD-10-CM

## 2018-08-31 NOTE — Telephone Encounter (Signed)
Copied from [place] [id number]. Topic: Quick Communication - Rx Refill/Question >> Aug 31, 2018  9:23 AM Judyann Munson wrote: Medication: carbamazepine (TEGRETOL) 200 MG tablet  Has the patient contacted their pharmacy? yes  Preferred Pharmacy (with phone number or street name): [id number] [address], [place] [phone] (Phone) [phone] (Fax)    Agent: Please be advised that RX refills may take up to 3 business days. We ask that you follow-up with your pharmacy.

## 2018-08-31 NOTE — Telephone Encounter (Signed)
Rx is pended for provider review already.

## 2018-09-04 DIAGNOSIS — N390 Urinary tract infection, site not specified: Secondary | ICD-10-CM | POA: Diagnosis not present

## 2018-09-08 ENCOUNTER — Ambulatory Visit
Admission: RE | Admit: 2018-09-08 | Discharge: 2018-09-08 | Disposition: A | Discharge status: Home / Self Care | Payer: Medicare Other | Source: Ambulatory Visit | Attending: Family Medicine | Admitting: Family Medicine

## 2018-09-08 DIAGNOSIS — E2839 Other primary ovarian failure: Secondary | ICD-10-CM | POA: Insufficient documentation

## 2018-09-08 DIAGNOSIS — Z780 Asymptomatic menopausal state: Secondary | ICD-10-CM | POA: Diagnosis not present

## 2018-09-08 DIAGNOSIS — Z1231 Encounter for screening mammogram for malignant neoplasm of breast: Secondary | ICD-10-CM | POA: Insufficient documentation

## 2018-09-09 DIAGNOSIS — M170 Bilateral primary osteoarthritis of knee: Secondary | ICD-10-CM | POA: Diagnosis not present

## 2018-09-11 DIAGNOSIS — G500 Trigeminal neuralgia: Secondary | ICD-10-CM | POA: Insufficient documentation

## 2018-09-15 ENCOUNTER — Ambulatory Visit: Payer: Medicare Other | Admitting: Family Medicine

## 2018-09-22 ENCOUNTER — Other Ambulatory Visit: Payer: Self-pay | Admitting: Family Medicine

## 2018-09-22 DIAGNOSIS — G4709 Other insomnia: Secondary | ICD-10-CM

## 2018-09-25 ENCOUNTER — Other Ambulatory Visit: Payer: Self-pay | Admitting: Family Medicine

## 2018-09-25 DIAGNOSIS — G500 Trigeminal neuralgia: Secondary | ICD-10-CM

## 2018-10-02 ENCOUNTER — Ambulatory Visit (INDEPENDENT_AMBULATORY_CARE_PROVIDER_SITE_OTHER): Payer: Medicare Other | Admitting: Family Medicine

## 2018-10-02 ENCOUNTER — Encounter: Payer: Self-pay | Admitting: Family Medicine

## 2018-10-02 VITALS — BP 130/80 | HR 85 | Temp 97.5°F | Resp 16 | Ht 71.0 in | Wt 219.8 lb

## 2018-10-02 DIAGNOSIS — N183 Chronic kidney disease, stage 3 unspecified: Secondary | ICD-10-CM

## 2018-10-02 DIAGNOSIS — Z800 Family history of malignant neoplasm of digestive organs: Secondary | ICD-10-CM

## 2018-10-02 DIAGNOSIS — H9193 Unspecified hearing loss, bilateral: Secondary | ICD-10-CM

## 2018-10-02 DIAGNOSIS — R0981 Nasal congestion: Secondary | ICD-10-CM | POA: Diagnosis not present

## 2018-10-02 DIAGNOSIS — Z01110 Encounter for hearing examination following failed hearing screening: Secondary | ICD-10-CM

## 2018-10-02 DIAGNOSIS — Z0000 Encounter for general adult medical examination without abnormal findings: Secondary | ICD-10-CM | POA: Diagnosis not present

## 2018-10-02 DIAGNOSIS — Z1211 Encounter for screening for malignant neoplasm of colon: Secondary | ICD-10-CM | POA: Diagnosis not present

## 2018-10-02 DIAGNOSIS — G500 Trigeminal neuralgia: Secondary | ICD-10-CM | POA: Diagnosis not present

## 2018-10-02 MED ORDER — LORATADINE 10 MG PO TABS: 10.0000 mg | ORAL_TABLET | Freq: Every day | ORAL | 0 refills | Status: DC

## 2018-10-02 NOTE — Progress Notes (Signed)
Patient: Melissa [name], Female    DOB: 1950-10-23, 68 y.o.   MRN: [id number]  Visit Date: 10/02/2018  Today's Provider: Loistine Chance, MD   Chief Complaint  Patient presents with  . Welcome to Medicare    Subjective:    HPI Melissa [name] is a 68 y.o. female who presents today for her Welcome to Medicare  .  Patient/Caregiver input:  Ear fullness   Trigeminal neuralgia: seen at [facility] Neurology department, she was already taking carbamazepine and symptoms had resolved, she is to continue medication until the Summer and wean self off once she goes back for follow up  CKI: no pruritis we will recheck labs yearly, feeling well  Hearing loss/fullness: she has been very congested past week and over the past few days having ear fulness and mild loss, no pain or fever, no tinnitus. She is using nasal spray, advised to add saline spray     Review of Systems  Constitutional: Negative for fever or weight change.  Respiratory: Negative for cough and shortness of breath.   Cardiovascular: Negative for chest pain or palpitations.  Gastrointestinal: Negative for abdominal pain, no bowel changes.  Musculoskeletal: Negative for gait problem or joint swelling.  Skin: Negative for rash.  Neurological: Negative for dizziness or headache.  No other specific complaints in a complete review of systems (except as listed in HPI above).  Past Medical History:  Diagnosis Date  . Bruising   . Cervical radiculopathy   . Deficiency of vitamin B   . Depression   . Edema   . Flank pain   . Gross hematuria   . HTN (hypertension)   . Iron deficiency anemia   . Leukopenia   . OA (osteoarthritis)   . Obesity   . OSA on CPAP   . Osteopenia   . Perennial allergic rhinitis   . Renal cysts, acquired, bilateral   . RLS (restless legs syndrome)   . Sickle cell trait ([place])   . Snoring   . Spinal stenosis     Past Surgical History:  Procedure Laterality Date  . BARIATRIC SURGERY     . CESAREAN SECTION     3 or more  . GASTRIC BYPASS    . TUBAL LIGATION      Family History  Problem Relation Age of Onset  . Hypercholesterolemia Mother   . Heart disease Mother   . Hypertension Mother   . Alcohol abuse Father   . Lung cancer Brother   . Alcohol abuse Brother   . Diabetes Mellitus II Sister   . Hypertension Maternal Grandmother   . Colon cancer Sister   . Breast cancer Neg Hx     Social History   Socioeconomic History  . Marital status: Married    Spouse name: Sandeep Delagarza  . Number of children: 4  . Years of education: Not on file  . Highest education level: Some college, no degree  Occupational History  . Not on file  Social Needs  . Financial resource strain: Not hard at all  . Food insecurity:    Worry: Never true    Inability: Never true  . Transportation needs:    Medical: No    Non-medical: No  Tobacco Use  . Smoking status: Never Smoker  . Smokeless tobacco: Never Used  Substance and Sexual Activity  . Alcohol use: Yes    Alcohol/week: 0.0 standard drinks    Comment: occasionally  . Drug use: No  . Sexual  activity: Yes    Partners: Male    Birth control/protection: Post-menopausal  Lifestyle  . Physical activity:    Days per week: 0 days    Minutes per session: 0 min  . Stress: Not at all  Relationships  . Social connections:    Talks on phone: More than three times a week    Gets together: More than three times a week    Attends religious service: Never    Active member of club or organization: No    Attends meetings of clubs or organizations: Never    Relationship status: Married  . Intimate partner violence:    Fear of current or ex partner: No    Emotionally abused: No    Physically abused: No    Forced sexual activity: No  Other Topics Concern  . Not on file  Social History Narrative  . Not on file    Outpatient Encounter Medications as of 10/02/2018  Medication Sig  . aspirin 81 MG EC tablet TAKE 1 TABLET BY  MOUTH EVERY DAY  . atorvastatin (LIPITOR) 40 MG tablet Take 1 tablet (40 mg total) by mouth daily.  . Azelastine HCl 137 MCG/SPRAY SOLN Place 2 sprays into the nose daily. 2 sprays in each nostril daily  . beclomethasone (QVAR) 80 MCG/ACT inhaler Inhale 2 puffs into the lungs 2 (two) times daily. Rinse mouth after use.  . carbamazepine (TEGRETOL) 200 MG tablet TAKE 1 TABLET (200 MG TOTAL) BY MOUTH 3 (THREE) TIMES DAILY.  Marland Kitchen Cholecalciferol (VITAMIN D3 ADULT GUMMIES PO) Take 2 each by mouth daily.   . Coenzyme Q10 (COQ10) 100 MG CAPS Take 1 capsule by mouth daily.  . ferrous sulfate (IRON SUPPLEMENT) 325 (65 FE) MG tablet Take 325 mg by mouth daily with breakfast.  . fluticasone (FLONASE) 50 MCG/ACT nasal spray SPRAY 2 SPRAYS INTO EACH NOSTRIL EVERY DAY  . linaclotide (LINZESS) 145 MCG CAPS capsule Take 1 capsule (145 mcg total) by mouth daily before breakfast.  . losartan (COZAAR) 25 MG tablet Take 1 tablet (25 mg total) by mouth daily.  . magnesium oxide (MAG-OX) 400 MG tablet Take 400 mg by mouth daily. Reported on 12/08/2015  . Multiple Vitamins-Minerals (CENTRUM SILVER) tablet Take 1 tablet by mouth daily.  Marland Kitchen omeprazole (PRILOSEC) 20 MG capsule Take 1 capsule (20 mg total) by mouth daily.  . QUEtiapine (SEROQUEL) 25 MG tablet Take 1 tablet (25 mg total) by mouth every evening. For sleep  . ranitidine (ZANTAC) 150 MG tablet TAKE 1 TABLET (150 MG TOTAL) BY MOUTH DAILY AS NEEDED FOR HEARTBURN.  . traMADol (ULTRAM) 50 MG tablet Take 1 tablet (50 mg total) by mouth every 8 (eight) hours as needed. (Patient taking differently: Take 50 mg by mouth every 8 (eight) hours as needed for moderate pain. )   No facility-administered encounter medications on file as of 10/02/2018.     Allergies  Allergen Reactions  . Contrast Media [Iodinated Diagnostic Agents]   . Shellfish Allergy     Edema    Care Team Updated in EHR: Yes  Last Vision Exam:  Nov 2019 Wears corrective lenses: Yes Last Dental Exam:  every 6 months  Last Hearing Exam: today Wears Hearing Aids: No  Functional Ability / Safety Screening 1.  Was the timed Get Up and Go test shorter than 30 seconds?  yes 2.  Does the patient need help with the phone, transportation, shopping,      preparing meals, housework, laundry, medications, or managing money?  yes 3.  Is the patient's home free of loose throw rugs in walkways, pet beds, electrical cords, etc?   yes      Grab bars in the bathroom? yes      Handrails on the stairs?   yes      Adequate lighting?   yes 4.  Has the patient noticed any hearing difficulties?   yes recently with nasal congestion   Diet Recall and Exercise Regimen: eating healthier at home   Advanced Care Planning: A voluntary discussion about advance care planning including the explanation and discussion of advance directives.  Discussed health care proxy and Living will, and the patient was able to identify a health care proxy as husband .  Patient does not have a living will at present time. Does patient have a HCPOA?    no If yes, name and contact information: N/A Does patient have a living will or MOST form?  no  Cancer Screenings: Skin: discussed atypical lesions  Lung:  Low Dose CT Chest recommended if Age 40-80 years, 30 pack-year currently smoking OR have quit w/in 15years. Patient does not qualify. Breast:  Up to date on Mammogram? Yes  Up to date of Bone Density/Dexa? Yes Colon: due for repeat this Summer   Additional Screenings:  Hepatitis B/HIV/Syphillis:N/A Hepatitis C Screening: negative  Intimate Partner Violence: negative   Objective:   Vitals: BP 130/80 (BP Location: Left Arm, Patient Position: Sitting, Cuff Size: Large)   Pulse 85   Temp (!) 97.5 F (36.4 C) (Oral)   Resp 16   Ht 5\' 11"  (1.803 m)   Wt 219 lb 12.8 oz (99.7 kg)   SpO2 98%   BMI 30.66 kg/m  Body mass index is 30.66 kg/m.   Hearing Screening   125Hz  250Hz  500Hz  1000Hz  2000Hz  3000Hz  4000Hz  6000Hz  8000Hz    Right ear:   Fail Pass Pass  Pass    Left ear:   Pass Pass Pass  Pass      Visual Acuity Screening   Right eye Left eye Both eyes  Without correction:     With correction: 20/20 20/25 20/20     Physical Exam Constitutional: Patient appears well-developed and well-nourished. Obese No distress.  HEENT: head atraumatic, normocephalic, pupils equal and reactive to light, ears normal bilateral,  neck supple, throat within normal limits Cardiovascular: Normal rate, regular rhythm and normal heart sounds.  No murmur heard. No BLE edema. Pulmonary/Chest: Effort normal and breath sounds normal. No respiratory distress. Abdominal: Soft.  There is no tenderness. Psychiatric: Patient has a normal mood and affect. behavior is normal. Judgment and thought content normal.  Cognitive Testing - 6-CIT  Correct? Score   What year is it? yes 0 Yes = 0    No = 4  What month is it? yes 0 Yes = 0    No = 3  Remember:     Pia Mau, [place], [place]     What time is it? yes 0 Yes = 0    No = 3  Count backwards from 20 to 1 yes 0 Correct = 0    1 error = 2   More than 1 error = 4  Say the months of the year in reverse. yes 0 Correct = 0    1 error = 2   More than 1 error = 4  What address did I ask you to remember? yes 0 Correct = 0  1 error = 2  2 error = 4    3 error = 6    4 error = 8    All wrong = 10       TOTAL SCORE  0/28   Interpretation:  Normal  Normal (0-7) Abnormal (8-28)   Fall Risk: Fall Risk  07/02/2018 02/24/2018 11/18/2017 07/25/2017 06/09/2017  Falls in the past year? 0 No No No No  Number falls in past yr: - - - - -  Injury with Fall? - - - - -    Depression Screen Depression screen [facility] 2/9 10/02/2018 07/02/2018 02/24/2018 11/18/2017 07/25/2017  Decreased Interest 0 0 0 0 0  Down, Depressed, Hopeless 0 0 0 0 1  PHQ - 2 Score 0 0 0 0 1  Altered sleeping 0 3 0 0 1  Tired, decreased energy 1 3 3  0 1  Change in appetite 0 0 0 1 3  Feeling bad or failure about yourself  0 0 0 0 3   Trouble concentrating 0 0 0 0 0  Moving slowly or fidgety/restless 0 0 0 0 0  Suicidal thoughts 0 0 0 0 0  PHQ-9 Score 1 6 3 1 9   Difficult doing work/chores Not difficult at all Not difficult at all Not difficult at all Not difficult at all Somewhat difficult    No results found for this or any previous visit (from the past 2160 hour(s)).  Assessment & Plan:    1. Welcome to Medicare preventive visit  - EKG 12-Lead - Visual acuity screening   2. Hearing decreased, bilateral  But only failed one frequency on hearing screen  3. Chronic kidney disease (CKD), stage III (moderate) ([place])  Recheck labs yearly   4. Colon cancer screening  - Ambulatory referral to Gastroenterology  5. Family history of colon cancer  - Ambulatory referral to Gastroenterology  6. Hearing screen following failed hearing test  She wants to hold off on referral to ENT  7. Trigeminal neuralgia  Seen by neurologist doing well on carbamazepine   8. Nasal congestion  - loratadine (CLARITIN) 10 MG tablet; Take 1 tablet (10 mg total) by mouth daily.  Dispense: 90 tablet; Refill: 0  type dotphrase "dot"diagmed to refresh this list  Exercise Activities and Dietary recommendations    - Discussed health benefits of physical activity, and encouraged her to engage in regular exercise appropriate for her age and condition.   Immunization History  Administered Date(s) Administered  . Influenza, High Dose Seasonal PF 07/02/2018  . Influenza,inj,Quad PF,6+ Mos 06/13/2015, 08/21/2016, 05/07/2017  . Pneumococcal Conjugate-13 02/24/2018  . Tdap 04/29/2011  . Zoster 08/21/2016  . Zoster Recombinat (Shingrix) 02/24/2018    Health Maintenance  Topic Date Due  . PNA vac Low Risk Adult (2 of 2 - PPSV23) 02/25/2019  . COLONOSCOPY  03/04/2019  . PAP SMEAR-Modifier  08/23/2019  . MAMMOGRAM  09/08/2020  . TETANUS/TDAP  04/28/2021  . INFLUENZA VACCINE  Completed  . DEXA SCAN  Completed  . Hepatitis C  Screening  Completed  . HIV Screening  Completed    No orders of the defined types were placed in this encounter.   Current Outpatient Medications:  .  aspirin 81 MG EC tablet, TAKE 1 TABLET BY MOUTH EVERY DAY, Disp: 30 tablet, Rfl: 0 .  atorvastatin (LIPITOR) 40 MG tablet, Take 1 tablet (40 mg total) by mouth daily., Disp: 90 tablet, Rfl: 1 .  Azelastine HCl 137 MCG/SPRAY SOLN, Place 2 sprays into the nose daily. 2 sprays  in each nostril daily, Disp: 1 Bottle, Rfl: 5 .  beclomethasone (QVAR) 80 MCG/ACT inhaler, Inhale 2 puffs into the lungs 2 (two) times daily. Rinse mouth after use., Disp: 1 Inhaler, Rfl: 6 .  carbamazepine (TEGRETOL) 200 MG tablet, TAKE 1 TABLET (200 MG TOTAL) BY MOUTH 3 (THREE) TIMES DAILY., Disp: 90 tablet, Rfl: 0 .  Cholecalciferol (VITAMIN D3 ADULT GUMMIES PO), Take 2 each by mouth daily. , Disp: , Rfl:  .  Coenzyme Q10 (COQ10) 100 MG CAPS, Take 1 capsule by mouth daily., Disp: 90 each, Rfl: 1 .  ferrous sulfate (IRON SUPPLEMENT) 325 (65 FE) MG tablet, Take 325 mg by mouth daily with breakfast., Disp: , Rfl:  .  fluticasone (FLONASE) 50 MCG/ACT nasal spray, SPRAY 2 SPRAYS INTO EACH NOSTRIL EVERY DAY, Disp: 16 g, Rfl: 3 .  linaclotide (LINZESS) 145 MCG CAPS capsule, Take 1 capsule (145 mcg total) by mouth daily before breakfast., Disp: 90 capsule, Rfl: 1 .  losartan (COZAAR) 25 MG tablet, Take 1 tablet (25 mg total) by mouth daily., Disp: 90 tablet, Rfl: 1 .  magnesium oxide (MAG-OX) 400 MG tablet, Take 400 mg by mouth daily. Reported on 12/08/2015, Disp: , Rfl:  .  Multiple Vitamins-Minerals (CENTRUM SILVER) tablet, Take 1 tablet by mouth daily., Disp: 30 tablet, Rfl: 0 .  omeprazole (PRILOSEC) 20 MG capsule, Take 1 capsule (20 mg total) by mouth daily., Disp: 30 capsule, Rfl: 11 .  QUEtiapine (SEROQUEL) 25 MG tablet, Take 1 tablet (25 mg total) by mouth every evening. For sleep, Disp: 90 tablet, Rfl: 0 .  ranitidine (ZANTAC) 150 MG tablet, TAKE 1 TABLET (150 MG TOTAL)  BY MOUTH DAILY AS NEEDED FOR HEARTBURN., Disp: 90 tablet, Rfl: 0 .  traMADol (ULTRAM) 50 MG tablet, Take 1 tablet (50 mg total) by mouth every 8 (eight) hours as needed. (Patient taking differently: Take 50 mg by mouth every 8 (eight) hours as needed for moderate pain. ), Disp: 90 tablet, Rfl: 0 There are no discontinued medications.  I have personally reviewed and addressed the Medicare Annual Wellness health risk assessment questionnaire and have noted the following in the patient's chart:  A.         Medical and social history & family history B.         Use of alcohol, tobacco, and illicit drugs  C.         Current medications and supplements D.         Functional and Cognitive ability and status E.         Nutritional status F.         Physical activity G.        Advance directives H.         List of other physicians I.          Hospitalizations, surgeries, and ER visits in previous 12 months J.         [employer] such as hearing, vision, cognitive function, and depression L.         Referrals and appointments: GI   In addition, I have reviewed and discussed with patient certain preventive protocols, quality metrics, and best practice recommendations. A written personalized care plan for preventive services as well as general preventive health recommendations were provided to patient.   See attached scanned questionnaire for additional information.

## 2018-10-02 NOTE — Patient Instructions (Signed)
Preventive Care 66 Years and Older, Female Preventive care refers to lifestyle choices and visits with your health care provider that can promote health and wellness. What does preventive care include?  A yearly physical exam. This is also called an annual well check.  Dental exams once or twice a year.  Routine eye exams. Ask your health care provider how often you should have your eyes checked.  Personal lifestyle choices, including: ? Daily care of your teeth and gums. ? Regular physical activity. ? Eating a healthy diet. ? Avoiding tobacco and drug use. ? Limiting alcohol use. ? Practicing safe sex. ? Taking low-dose aspirin every day. ? Taking vitamin and mineral supplements as recommended by your health care provider. What happens during an annual well check? The services and screenings done by your health care provider during your annual well check will depend on your age, overall health, lifestyle risk factors, and family history of disease. Counseling Your health care provider may ask you questions about your:  Alcohol use.  Tobacco use.  Drug use.  Emotional well-being.  Home and relationship well-being.  Sexual activity.  Eating habits.  History of falls.  Memory and ability to understand (cognition).  Work and work Statistician.  Reproductive health.  Screening You may have the following tests or measurements:  Height, weight, and BMI.  Blood pressure.  Lipid and cholesterol levels. These may be checked every 5 years, or more frequently if you are over 30 years old.  Skin check.  Lung cancer screening. You may have this screening every year starting at age 27 if you have a 30-pack-year history of smoking and currently smoke or have quit within the past 15 years.  Colorectal cancer screening. All adults should have this screening starting at age 33 and continuing until age 46. You will have tests every 1-10 years, depending on your results and the  type of screening test. People at increased risk should start screening at an earlier age. Screening tests may include: ? Guaiac-based fecal occult blood testing. ? Fecal immunochemical test (FIT). ? Stool DNA test. ? Virtual colonoscopy. ? Sigmoidoscopy. During this test, a flexible tube with a tiny camera (sigmoidoscope) is used to examine your rectum and lower colon. The sigmoidoscope is inserted through your anus into your rectum and lower colon. ? Colonoscopy. During this test, a long, thin, flexible tube with a tiny camera (colonoscope) is used to examine your entire colon and rectum.  Hepatitis C blood test.  Hepatitis B blood test.  Sexually transmitted disease (STD) testing.  Diabetes screening. This is done by checking your blood sugar (glucose) after you have not eaten for a while (fasting). You may have this done every 1-3 years.  Bone density scan. This is done to screen for osteoporosis. You may have this done starting at age 37.  Mammogram. This may be done every 1-2 years. Talk to your health care provider about how often you should have regular mammograms. Talk with your health care provider about your test results, treatment options, and if necessary, the need for more tests. Vaccines Your health care provider may recommend certain vaccines, such as:  Influenza vaccine. This is recommended every year.  Tetanus, diphtheria, and acellular pertussis (Tdap, Td) vaccine. You may need a Td booster every 10 years.  Varicella vaccine. You may need this if you have not been vaccinated.  Zoster vaccine. You may need this after age 38.  Measles, mumps, and rubella (MMR) vaccine. You may need at least  one dose of MMR if you were born in 1957 or later. You may also need a second dose.  Pneumococcal 13-valent conjugate (PCV13) vaccine. One dose is recommended after age 24.  Pneumococcal polysaccharide (PPSV23) vaccine. One dose is recommended after age 24.  Meningococcal  vaccine. You may need this if you have certain conditions.  Hepatitis A vaccine. You may need this if you have certain conditions or if you travel or work in places where you may be exposed to hepatitis A.  Hepatitis B vaccine. You may need this if you have certain conditions or if you travel or work in places where you may be exposed to hepatitis B.  Haemophilus influenzae type b (Hib) vaccine. You may need this if you have certain conditions. Talk to your health care provider about which screenings and vaccines you need and how often you need them. This information is not intended to replace advice given to you by your health care provider. Make sure you discuss any questions you have with your health care provider. Document Released: 09/01/2015 Document Revised: 09/25/2017 Document Reviewed: 06/06/2015 Elsevier Interactive Patient Education  2019 [employer].

## 2018-10-14 ENCOUNTER — Telehealth: Payer: Self-pay | Admitting: Gastroenterology

## 2018-10-14 ENCOUNTER — Telehealth: Payer: Self-pay

## 2018-10-14 ENCOUNTER — Other Ambulatory Visit: Payer: Self-pay

## 2018-10-14 DIAGNOSIS — Z86010 Personal history of colonic polyps: Secondary | ICD-10-CM

## 2018-10-14 NOTE — Telephone Encounter (Signed)
-----   Message from Vanetta Mulders, [place] sent at 07/02/2018  1:50 PM EST ----- Regarding: Colonoscopy March 2020 Contact patient in Feb 2020 to schedule for colonoscopy.

## 2018-10-14 NOTE — Telephone Encounter (Signed)
Colonoscopy has been scheduled with Dr. Allen Norris at [facility] on 11/10/18.  Thanks [employer]

## 2018-10-14 NOTE — Telephone Encounter (Signed)
PT is calling to schedule colonoscopy

## 2018-10-14 NOTE — Telephone Encounter (Signed)
LVM for pt to contact office to schedule her colonoscopy.  Thanks [employer]

## 2018-10-29 DIAGNOSIS — H900 Conductive hearing loss, bilateral: Secondary | ICD-10-CM | POA: Diagnosis not present

## 2018-10-29 DIAGNOSIS — H903 Sensorineural hearing loss, bilateral: Secondary | ICD-10-CM | POA: Diagnosis not present

## 2018-10-29 DIAGNOSIS — H6123 Impacted cerumen, bilateral: Secondary | ICD-10-CM | POA: Diagnosis not present

## 2018-11-05 ENCOUNTER — Telehealth: Payer: Self-pay

## 2018-11-05 NOTE — Telephone Encounter (Signed)
Patient has contacted office to cancel her 11/10/18 Colonoscopy with Dr. Allen Norris.  She doesn't want to be out right now.  Kieth Brightly in Endo has been informed.  Thanks [employer]

## 2018-11-10 ENCOUNTER — Ambulatory Visit: Admit: 2018-11-10 | Payer: Medicare Other | Admitting: Gastroenterology

## 2018-11-10 SURGERY — COLONOSCOPY WITH PROPOFOL
Anesthesia: General

## 2018-11-27 ENCOUNTER — Encounter: Payer: Self-pay | Admitting: Family Medicine

## 2018-11-27 ENCOUNTER — Ambulatory Visit (INDEPENDENT_AMBULATORY_CARE_PROVIDER_SITE_OTHER): Payer: Medicare Other | Admitting: Family Medicine

## 2018-11-27 ENCOUNTER — Other Ambulatory Visit: Payer: Self-pay

## 2018-11-27 VITALS — BP 140/90 | HR 75 | Temp 97.8°F | Resp 16 | Ht 71.0 in | Wt 223.6 lb

## 2018-11-27 DIAGNOSIS — G8929 Other chronic pain: Secondary | ICD-10-CM | POA: Diagnosis not present

## 2018-11-27 DIAGNOSIS — I10 Essential (primary) hypertension: Secondary | ICD-10-CM | POA: Diagnosis not present

## 2018-11-27 DIAGNOSIS — D508 Other iron deficiency anemias: Secondary | ICD-10-CM

## 2018-11-27 DIAGNOSIS — R1031 Right lower quadrant pain: Secondary | ICD-10-CM | POA: Diagnosis not present

## 2018-11-27 DIAGNOSIS — R600 Localized edema: Secondary | ICD-10-CM | POA: Diagnosis not present

## 2018-11-27 DIAGNOSIS — M545 Low back pain, unspecified: Secondary | ICD-10-CM

## 2018-11-27 LAB — POCT URINALYSIS DIPSTICK
Appearance: NORMAL
Bilirubin, UA: NEGATIVE
Blood, UA: POSITIVE
Glucose, UA: NEGATIVE
Ketones, UA: NEGATIVE
Leukocytes, UA: NEGATIVE
Nitrite, UA: NEGATIVE
Odor: NORMAL
Protein, UA: NEGATIVE
Spec Grav, UA: 1.01 (ref 1.010–1.025)
Urobilinogen, UA: 0.2 E.U./dL
pH, UA: 6.5 (ref 5.0–8.0)

## 2018-11-27 MED ORDER — TRAMADOL HCL 50 MG PO TABS: 50.0000 mg | ORAL_TABLET | Freq: Three times a day (TID) | ORAL | 0 refills | Status: DC | PRN

## 2018-11-27 MED ORDER — HYDROCHLOROTHIAZIDE 12.5 MG PO CAPS: 12.5000 mg | ORAL_CAPSULE | Freq: Every day | ORAL | 0 refills | Status: DC

## 2018-11-27 MED ORDER — CIPROFLOXACIN HCL 250 MG PO TABS: 250.0000 mg | ORAL_TABLET | Freq: Two times a day (BID) | ORAL | 0 refills | Status: DC

## 2018-11-27 NOTE — Addendum Note (Signed)
Addended by: Steele Sizer F on: 11/27/2018 11:39 AM   Modules accepted: Orders

## 2018-11-27 NOTE — Progress Notes (Addendum)
Name: Melissa [name]   MRN: [id number]    DOB: 09-08-1950   Date:11/27/2018       Progress Note  Subjective  Chief Complaint  Chief Complaint  Patient presents with  . Foot Swelling    x 1 week feels tightness when she walks.  . Abdominal Pain    x 2 weeks constant dull ache, it increases in intensity.  . Back Pain    she is not as concerned about the back pain because she has back issues.    HPI  Acute on chronic right low back pain: she has a long history of DDD and had steroid injection about 6 months ago. She states pain started this time 3 weeks ago, aching like and initially thought secondary to back pain flare. No bowel changes, no urine incontinence, no rashes. Sitting aggravates, but leaning forward helps relieve the pain, okay when walking but standing still makes it worse  RLQ pain: started a couple of weeks ago, after the back pain, but not sure if related . She states constant , dull like, not aggravated but meals or activity. She states sometimes is more intense but not sure what causes it. No change in bowel movements, always constipation, not affecting her sleep, no nausea or vomiting, no fever or chills.   Let swelling: she has a history of iron deficiency anemia and CKI and has noticed swelling mostly on left ankle , but on exam both legs swollen  HTN: bp slightly elevated with swelling, discussed with patient and she is willing to try hctz, elevate legs and wear compression stocking hoses  Patient Active Problem List   Diagnosis Date Noted  . Trigeminal neuralgia 09/11/2018  . Cardiac LV ejection fraction of 40-49% 05/07/2017  . Low back pain without sciatica 02/21/2017  . Renal cyst 10/18/2016  . Atherosclerosis of aorta ([place]) 10/16/2016  . History of colonic polyps 09/25/2016  . Dyslipidemia 12/08/2015  . Gastroesophageal reflux disease without esophagitis 08/11/2015  . Chronic kidney disease (CKD), stage III (moderate) ([place]) 07/02/2015  . Primary  osteoarthritis of knee 06/13/2015  . Hip bursitis 06/13/2015  . RLS (restless legs syndrome) 06/13/2015  . Depression, major, in remission ([place]) 06/13/2015  . B12 deficiency 06/13/2015  . History of Roux-en-Y gastric bypass 06/13/2015  . History of iron deficiency anemia 06/13/2015  . Vitamin D deficiency 06/13/2015  . Hypertension, benign 06/13/2015  . Perennial allergic rhinitis with seasonal variation 06/13/2015  . Obesity (BMI 30.0-34.9) 06/13/2015  . Cervical radiculitis 01/04/2014  . Cervical spinal stenosis 01/04/2014  . Cervical osteoarthritis 01/04/2014    Past Surgical History:  Procedure Laterality Date  . BARIATRIC SURGERY    . CESAREAN SECTION     3 or more  . GASTRIC BYPASS    . TUBAL LIGATION      Family History  Problem Relation Age of Onset  . Hypercholesterolemia Mother   . Heart disease Mother   . Hypertension Mother   . Alcohol abuse Father   . Lung cancer Brother   . Alcohol abuse Brother   . Diabetes Mellitus II Sister   . Hypertension Maternal Grandmother   . Colon cancer Sister   . Breast cancer Neg Hx     Social History   Socioeconomic History  . Marital status: Married    Spouse name: Tesla Keeler  . Number of children: 4  . Years of education: Not on file  . Highest education level: Some college, no degree  Occupational History  .  Not on file  Social Needs  . Financial resource strain: Not hard at all  . Food insecurity:    Worry: Never true    Inability: Never true  . Transportation needs:    Medical: No    Non-medical: No  Tobacco Use  . Smoking status: Never Smoker  . Smokeless tobacco: Never Used  Substance and Sexual Activity  . Alcohol use: Yes    Alcohol/week: 0.0 standard drinks    Comment: occasionally  . Drug use: No  . Sexual activity: Yes    Partners: Male    Birth control/protection: Post-menopausal  Lifestyle  . Physical activity:    Days per week: 0 days    Minutes per session: 0 min  . Stress: Not at all   Relationships  . Social connections:    Talks on phone: More than three times a week    Gets together: More than three times a week    Attends religious service: Never    Active member of club or organization: No    Attends meetings of clubs or organizations: Never    Relationship status: Married  . Intimate partner violence:    Fear of current or ex partner: No    Emotionally abused: No    Physically abused: No    Forced sexual activity: No  Other Topics Concern  . Not on file  Social History Narrative  . Not on file     Current Outpatient Medications:  .  aspirin 81 MG EC tablet, TAKE 1 TABLET BY MOUTH EVERY DAY, Disp: 30 tablet, Rfl: 0 .  atorvastatin (LIPITOR) 40 MG tablet, Take 1 tablet (40 mg total) by mouth daily., Disp: 90 tablet, Rfl: 1 .  Azelastine HCl 137 MCG/SPRAY SOLN, Place 2 sprays into the nose daily. 2 sprays in each nostril daily, Disp: 1 Bottle, Rfl: 5 .  beclomethasone (QVAR) 80 MCG/ACT inhaler, Inhale 2 puffs into the lungs 2 (two) times daily. Rinse mouth after use., Disp: 1 Inhaler, Rfl: 6 .  carbamazepine (TEGRETOL) 200 MG tablet, TAKE 1 TABLET (200 MG TOTAL) BY MOUTH 3 (THREE) TIMES DAILY., Disp: 90 tablet, Rfl: 0 .  Cholecalciferol (VITAMIN D3 ADULT GUMMIES PO), Take 2 each by mouth daily. , Disp: , Rfl:  .  Coenzyme Q10 (COQ10) 100 MG CAPS, Take 1 capsule by mouth daily., Disp: 90 each, Rfl: 1 .  ferrous sulfate (IRON SUPPLEMENT) 325 (65 FE) MG tablet, Take 325 mg by mouth daily with breakfast., Disp: , Rfl:  .  fluticasone (FLONASE) 50 MCG/ACT nasal spray, SPRAY 2 SPRAYS INTO EACH NOSTRIL EVERY DAY, Disp: 16 g, Rfl: 3 .  linaclotide (LINZESS) 145 MCG CAPS capsule, Take 1 capsule (145 mcg total) by mouth daily before breakfast., Disp: 90 capsule, Rfl: 1 .  loratadine (CLARITIN) 10 MG tablet, Take 1 tablet (10 mg total) by mouth daily., Disp: 90 tablet, Rfl: 0 .  losartan (COZAAR) 25 MG tablet, Take 1 tablet (25 mg total) by mouth daily., Disp: 90 tablet,  Rfl: 1 .  magnesium oxide (MAG-OX) 400 MG tablet, Take 400 mg by mouth daily. Reported on 12/08/2015, Disp: , Rfl:  .  Multiple Vitamins-Minerals (CENTRUM SILVER) tablet, Take 1 tablet by mouth daily., Disp: 30 tablet, Rfl: 0 .  omeprazole (PRILOSEC) 20 MG capsule, Take 1 capsule (20 mg total) by mouth daily., Disp: 30 capsule, Rfl: 11 .  QUEtiapine (SEROQUEL) 25 MG tablet, Take 1 tablet (25 mg total) by mouth every evening. For sleep, Disp: 90 tablet,  Rfl: 0 .  ranitidine (ZANTAC) 150 MG tablet, TAKE 1 TABLET (150 MG TOTAL) BY MOUTH DAILY AS NEEDED FOR HEARTBURN., Disp: 90 tablet, Rfl: 0 .  traMADol (ULTRAM) 50 MG tablet, Take 1 tablet (50 mg total) by mouth every 8 (eight) hours as needed. (Patient taking differently: Take 50 mg by mouth every 8 (eight) hours as needed for moderate pain. ), Disp: 90 tablet, Rfl: 0  Allergies  Allergen Reactions  . Contrast Media [Iodinated Diagnostic Agents]   . Shellfish Allergy     Edema    I personally reviewed active problem list, medication list, allergies, family history with the patient/caregiver today.   ROS  Ten systems reviewed and is negative except as mentioned in HPI   Objective  Vitals:   11/27/18 1018  BP: 140/90  Pulse: 75  Resp: 16  Temp: 97.8 F (36.6 C)  TempSrc: Oral  SpO2: 98%  Weight: 223 lb 9.6 oz (101.4 kg)  Height: 5\' 11"  (1.803 m)    Body mass index is 31.19 kg/m.  Physical Exam  Constitutional: Patient appears well-developed and well-nourished. Obese  No distress.  HEENT: head atraumatic, normocephalic, pupils equal and reactive to light,  neck supple Cardiovascular: Normal rate, regular rhythm and normal heart sounds.  No murmur heard. 1 plus BLE edema. Pulmonary/Chest: Effort normal and breath sounds normal. No respiratory distress. Abdominal: Soft.  There is no tenderness. Mild decrease in bowel sounds.  Muscular Skeletal: negative pain during palpation, negative straight leg raise, negative  CVA Psychiatric: Patient has a normal mood and affect. behavior is normal. Judgment and thought content normal.  PHQ2/9: Depression screen [facility] 2/9 11/27/2018 10/02/2018 07/02/2018 02/24/2018 11/18/2017  Decreased Interest 0 0 0 0 0  Down, Depressed, Hopeless 0 0 0 0 0  PHQ - 2 Score 0 0 0 0 0  Altered sleeping 0 0 3 0 0  Tired, decreased energy 0 1 3 3  0  Change in appetite 0 0 0 0 1  Feeling bad or failure about yourself  0 0 0 0 0  Trouble concentrating 0 0 0 0 0  Moving slowly or fidgety/restless 0 0 0 0 0  Suicidal thoughts 0 0 0 0 0  PHQ-9 Score 0 1 6 3 1   Difficult doing work/chores - Not difficult at all Not difficult at all Not difficult at all Not difficult at all    phq 9 is negative  Fall Risk: Fall Risk  07/02/2018 02/24/2018 11/18/2017 07/25/2017 06/09/2017  Falls in the past year? 0 No No No No  Number falls in past yr: - - - - -  Injury with Fall? - - - - -    Functional Status Survey: Is the patient deaf or have difficulty hearing?: No Does the patient have difficulty seeing, even when wearing glasses/contacts?: Yes Does the patient have difficulty concentrating, remembering, or making decisions?: No Does the patient have difficulty walking or climbing stairs?: No Does the patient have difficulty dressing or bathing?: No Does the patient have difficulty doing errands alone such as visiting a doctor's office or shopping?: No    Assessment & Plan   1. Right lower quadrant abdominal pain  - POCT Urinalysis Dipstick - CULTURE, URINE COMPREHENSIVE - COMPLETE METABOLIC PANEL WITH GFR - ciprofloxacin (CIPRO) 250 MG tablet; Take 1 tablet (250 mg total) by mouth 2 (two) times daily.  Dispense: 6 tablet; Refill: 0  hematuria , seen by Urologist in the past   2. Other iron deficiency anemia  -  CBC with Differential/Platelet - Iron, TIBC and Ferritin Panel  3. Bilateral lower extremity edema  - CULTURE, URINE COMPREHENSIVE - CBC with Differential/Platelet Raise legs,  consider compression stocking hoses and check labs today   4. Acute right-sided low back pain without sciatica  Call Dr. Phyllis Ginger back   6. Hypertension, benign  - hydrochlorothiazide (MICROZIDE) 12.5 MG capsule; Take 1 capsule (12.5 mg total) by mouth daily.  Dispense: 30 capsule; Refill: 0

## 2018-11-30 LAB — CBC WITH DIFFERENTIAL/PLATELET
Absolute Monocytes: 555 cells/uL (ref 200–950)
Basophils Absolute: 38 cells/uL (ref 0–200)
Basophils Relative: 0.8 %
Eosinophils Absolute: 127 cells/uL (ref 15–500)
Eosinophils Relative: 2.7 %
HCT: 33.2 % — ABNORMAL LOW (ref 35.0–45.0)
Hemoglobin: 11 g/dL — ABNORMAL LOW (ref 11.7–15.5)
Lymphs Abs: 2209 cells/uL (ref 850–3900)
MCH: 30.4 pg (ref 27.0–33.0)
MCHC: 33.1 g/dL (ref 32.0–36.0)
MCV: 91.7 fL (ref 80.0–100.0)
MPV: 10.2 fL (ref 7.5–12.5)
Monocytes Relative: 11.8 %
Neutro Abs: 1772 cells/uL (ref 1500–7800)
Neutrophils Relative %: 37.7 %
Platelets: 202 10*3/uL (ref 140–400)
RBC: 3.62 10*6/uL — ABNORMAL LOW (ref 3.80–5.10)
RDW: 12.7 % (ref 11.0–15.0)
Total Lymphocyte: 47 %
WBC: 4.7 10*3/uL (ref 3.8–10.8)

## 2018-11-30 LAB — COMPLETE METABOLIC PANEL WITH GFR
AG Ratio: 1.5 (calc) (ref 1.0–2.5)
ALT: 17 U/L (ref 6–29)
AST: 25 U/L (ref 10–35)
Albumin: 3.8 g/dL (ref 3.6–5.1)
Alkaline phosphatase (APISO): 78 U/L (ref 37–153)
BUN/Creatinine Ratio: 15 (calc) (ref 6–22)
BUN: 22 mg/dL (ref 7–25)
CO2: 25 mmol/L (ref 20–32)
Calcium: 8.9 mg/dL (ref 8.6–10.4)
Chloride: 106 mmol/L (ref 98–110)
Creat: 1.5 mg/dL — ABNORMAL HIGH (ref 0.50–0.99)
GFR, Est African American: 42 mL/min/{1.73_m2} — ABNORMAL LOW (ref >=60)
GFR, Est Non African American: 36 mL/min/{1.73_m2} — ABNORMAL LOW (ref >=60)
Globulin: 2.6 g/dL (calc) (ref 1.9–3.7)
Glucose, Bld: 90 mg/dL (ref 65–99)
Potassium: 4.7 mmol/L (ref 3.5–5.3)
Sodium: 139 mmol/L (ref 135–146)
Total Bilirubin: 0.3 mg/dL (ref 0.2–1.2)
Total Protein: 6.4 g/dL (ref 6.1–8.1)

## 2018-11-30 LAB — CULTURE, URINE COMPREHENSIVE
MICRO NUMBER:: 390031
SPECIMEN QUALITY:: ADEQUATE

## 2018-11-30 LAB — IRON,TIBC AND FERRITIN PANEL
%SAT: 41 % (calc) (ref 16–45)
Ferritin: 40 ng/mL (ref 16–288)
Iron: 102 ug/dL (ref 45–160)
TIBC: 251 mcg/dL (calc) (ref 250–450)

## 2018-12-03 ENCOUNTER — Encounter: Payer: Self-pay | Admitting: Family Medicine

## 2018-12-04 ENCOUNTER — Other Ambulatory Visit: Payer: Self-pay | Admitting: Family Medicine

## 2018-12-04 MED ORDER — NITROFURANTOIN MONOHYD MACRO 100 MG PO CAPS: 100.0000 mg | ORAL_CAPSULE | Freq: Two times a day (BID) | ORAL | 0 refills | Status: DC

## 2018-12-05 ENCOUNTER — Encounter: Payer: Self-pay | Admitting: Family Medicine

## 2018-12-17 DIAGNOSIS — M5136 Other intervertebral disc degeneration, lumbar region: Secondary | ICD-10-CM | POA: Diagnosis not present

## 2018-12-17 DIAGNOSIS — M48062 Spinal stenosis, lumbar region with neurogenic claudication: Secondary | ICD-10-CM | POA: Diagnosis not present

## 2018-12-17 DIAGNOSIS — M5416 Radiculopathy, lumbar region: Secondary | ICD-10-CM | POA: Diagnosis not present

## 2018-12-22 ENCOUNTER — Other Ambulatory Visit: Payer: Self-pay | Admitting: Family Medicine

## 2018-12-22 DIAGNOSIS — I10 Essential (primary) hypertension: Secondary | ICD-10-CM

## 2019-01-01 ENCOUNTER — Ambulatory Visit: Payer: Medicare Other | Admitting: Family Medicine

## 2019-01-13 ENCOUNTER — Other Ambulatory Visit: Payer: Self-pay | Admitting: Internal Medicine

## 2019-01-14 DIAGNOSIS — M5136 Other intervertebral disc degeneration, lumbar region: Secondary | ICD-10-CM | POA: Diagnosis not present

## 2019-01-14 DIAGNOSIS — M5416 Radiculopathy, lumbar region: Secondary | ICD-10-CM | POA: Diagnosis not present

## 2019-01-14 DIAGNOSIS — M48062 Spinal stenosis, lumbar region with neurogenic claudication: Secondary | ICD-10-CM | POA: Diagnosis not present

## 2019-01-19 ENCOUNTER — Telehealth: Payer: Self-pay

## 2019-01-19 NOTE — Telephone Encounter (Signed)
Virtual Visit Pre-Appointment Phone Call  "Melissa [name], I am calling you today to discuss your upcoming appointment. We are currently trying to limit exposure to the virus that causes COVID-19 by seeing patients at home rather than in the office."  1. "What is the BEST phone number to call the day of the visit?" - include this in appointment notes  2. "Do you have or have access to (through a family member/friend) a smartphone with video capability that we can use for your visit?" a. If yes - list this number in appt notes as "cell" (if different from BEST phone #) and list the appointment type as a VIDEO visit in appointment notes b. If no - list the appointment type as a PHONE visit in appointment notes  3. Confirm consent - "In the setting of the current Covid19 crisis, you are scheduled for a video visit with your provider on 01/20/2019 at 11:30AM.  Just as we do with many in-office visits, in order for you to participate in this visit, we must obtain consent.  If you'd like, I can send this to your mychart (if signed up) or email for you to review.  Otherwise, I can obtain your verbal consent now.  All virtual visits are billed to your insurance company just like a normal visit would be.  By agreeing to a virtual visit, we'd like you to understand that the technology does not allow for your provider to perform an examination, and thus may limit your provider's ability to fully assess your condition. If your provider identifies any concerns that need to be evaluated in person, we will make arrangements to do so.  Finally, though the technology is pretty good, we cannot assure that it will always work on either your or our end, and in the setting of a video visit, we may have to convert it to a phone-only visit.  In either situation, we cannot ensure that we have a secure connection.  Are you willing to proceed?" STAFF: Did the patient verbally acknowledge consent to telehealth visit? Document YES/NO  here: YES  4. Advise patient to be prepared - "Two hours prior to your appointment, go ahead and check your blood pressure, pulse, oxygen saturation, and your weight (if you have the equipment to check those) and write them all down. When your visit starts, your provider will ask you for this information. If you have an Apple Watch or Kardia device, please plan to have heart rate information ready on the day of your appointment. Please have a pen and paper handy nearby the day of the visit as well."  5. Give patient instructions for MyChart download to smartphone OR [email or web address] as below if video visit (depending on what platform provider is using)  6. Inform patient they will receive a phone call 15 minutes prior to their appointment time (may be from unknown caller ID) so they should be prepared to answer    TELEPHONE CALL NOTE  KALISA GIRTMAN has been deemed a candidate for a follow-up tele-health visit to limit community exposure during the Covid-19 pandemic. I spoke with the patient via phone to ensure availability of phone/video source, confirm preferred email & phone number, and discuss instructions and expectations.  I reminded Melissa [name] to be prepared with any vital sign and/or heart rhythm information that could potentially be obtained via home monitoring, at the time of her visit. I reminded Melissa [name] to expect a phone call prior to her visit.  Melissa [name] 01/19/2019 10:18 AM   INSTRUCTIONS FOR DOWNLOADING THE MYCHART APP TO SMARTPHONE  - The patient must first make sure to have activated MyChart and know their login information - If Apple, go to [employer] and type in MyChart in the search bar and download the app. If Android, ask patient to go to [employer] and type in [place] in the search bar and download the app. The app is free but as with any other app downloads, their phone may require them to verify saved payment information or  Apple/Android password.  - The patient will need to then log into the app with their MyChart username and password, and select [facility] as their healthcare provider to link the account. When it is time for your visit, go to the MyChart app, find appointments, and click Begin Video Visit. Be sure to Select Allow for your device to access the Microphone and Camera for your visit. You will then be connected, and your provider will be with you shortly.  **If they have any issues connecting, or need assistance please contact MyChart service desk [phone])**  **If using a computer, in order to ensure the best quality for their visit they will need to use either of the following Internet Browsers: [employer], or Google Chrome**  IF USING DOXIMITY or DOXY.ME - The patient will receive a link just prior to their visit by text.     FULL LENGTH CONSENT FOR TELE-HEALTH VISIT   I hereby voluntarily request, consent and authorize [facility] and its employed or contracted physicians, physician assistants, nurse practitioners or other licensed health care professionals (the Practitioner), to provide me with telemedicine health care services (the "Services") as deemed necessary by the treating Practitioner. I acknowledge and consent to receive the Services by the Practitioner via telemedicine. I understand that the telemedicine visit will involve communicating with the Practitioner through live audiovisual communication technology and the disclosure of certain medical information by electronic transmission. I acknowledge that I have been given the opportunity to request an in-person assessment or other available alternative prior to the telemedicine visit and am voluntarily participating in the telemedicine visit.  I understand that I have the right to withhold or withdraw my consent to the use of telemedicine in the course of my care at any time, without affecting my right to future care  or treatment, and that the Practitioner or I may terminate the telemedicine visit at any time. I understand that I have the right to inspect all information obtained and/or recorded in the course of the telemedicine visit and may receive copies of available information for a reasonable fee.  I understand that some of the potential risks of receiving the Services via telemedicine include:  Marland Kitchen Delay or interruption in medical evaluation due to technological equipment failure or disruption; . Information transmitted may not be sufficient (e.g. poor resolution of images) to allow for appropriate medical decision making by the Practitioner; and/or  . In rare instances, security protocols could fail, causing a breach of personal health information.  Furthermore, I acknowledge that it is my responsibility to provide information about my medical history, conditions and care that is complete and accurate to the best of my ability. I acknowledge that Practitioner's advice, recommendations, and/or decision may be based on factors not within their control, such as incomplete or inaccurate data provided by me or distortions of diagnostic images or specimens that may result from electronic transmissions. I understand that  the practice of medicine is not an exact science and that Practitioner makes no warranties or guarantees regarding treatment outcomes. I acknowledge that I will receive a copy of this consent concurrently upon execution via email to the email address I last provided but may also request a printed copy by calling the office of [facility].    I understand that my insurance will be billed for this visit.   I have read or had this consent read to me. . I understand the contents of this consent, which adequately explains the benefits and risks of the Services being provided via telemedicine.  . I have been provided ample opportunity to ask questions regarding this consent and the Services and have had  my questions answered to my satisfaction. . I give my informed consent for the services to be provided through the use of telemedicine in my medical care  By participating in this telemedicine visit I agree to the above.

## 2019-01-19 NOTE — Progress Notes (Signed)
Virtual Visit via Video Note   This visit type was conducted due to national recommendations for restrictions regarding the COVID-19 Pandemic (e.g. social distancing) in an effort to limit this patient's exposure and mitigate transmission in our community.  Due to her co-morbid illnesses, this patient is at least at moderate risk for complications without adequate follow up.  This format is felt to be most appropriate for this patient at this time.  All issues noted in this document were discussed and addressed.  A limited physical exam was performed with this format.  Please refer to the patient's chart for her consent to telehealth for [facility].   Date:  01/20/2019   ID:  Melissa [name], DOB November 09, 1950, MRN [id number]  Patient Location: Home Provider Location: Home  PCP:  Steele Sizer, MD  Cardiologist:  Ida Rogue, MD  Electrophysiologist:  None   Evaluation Performed:  Follow-Up Visit  Chief Complaint:  Follow up  History of Present Illness:    Melissa [name] is a 68 y.o. female with history of cardiomyopathy (uncertain ICM vs NICM), aortic atherosclerosis, CKD stage III, HTN, HLD, obesity s/p gastric bypass, OSA on CPAP, RLS, sickle cell trait, cervical radiculopathy, chronic back pain, and depression who presents for follow up.   She was previously followed by Dr. Yvone Neu, though more recently has established with Dr. Rockey Situ. Prior Myoview in 10/2015 showed no evidence of significant ischemia, EF 57%, low risk scan. Echo on 11/24/2015 showed an EF of 35-40%, severe hypokinesis of the mid-apicalanteroseptal and anterior myocardium, Gr1DD, mild TR, PASP normal. Follow up limited echo on 12/05/2015 to reassess the EF showed an EF of 40-45%, mild diffuse hypokinesis, mild concentric LVH, normal size left atrium, RVSF normal, PASP normal. CT renal stone protocol from 10/2016 showed aortic atherosclerosis. She was last seen by Dr. Rockey Situ in 06/2017 and was doing well. She noted a  cough which had been attributed to asthma vs COPD.   She is seen in telemedicine follow up today and is doing well. She was recently seen by her PCP in 11/2018 with increased ankle swelling and placed on HCTZ. Since then, her ankle edema has improved. She denied any associated SOB, orthopnea, PND, or early satiety. No chest pain. Her weight has increased form 208 pounds when we last saw her to 227 pounds today. She has attributed this to increased snacking dating back to 04/2018. Blood pressure typically has ran in the 1-teens to 937D systolic. With the addition of HCTZ, her BP has been running in the low 428J systolic. She does most of her own cooking and does not add salt to foods.   Labs: 11/2018 - SCr 1.50, K+ 4.7, AST/ALT normal, HGB 11.0 02/2018 - A1c 5.4, LDL 88  The patient does not have symptoms concerning for COVID-19 infection (fever, chills, cough, or new shortness of breath).    Past Medical History:  Diagnosis Date  . Bruising   . Cervical radiculopathy   . Deficiency of vitamin B   . Depression   . Edema   . Flank pain   . Gross hematuria   . HTN (hypertension)   . Iron deficiency anemia   . Leukopenia   . OA (osteoarthritis)   . Obesity   . OSA on CPAP   . Osteopenia   . Perennial allergic rhinitis   . Renal cysts, acquired, bilateral   . RLS (restless legs syndrome)   . Sickle cell trait ([place])   . Snoring   .  Spinal stenosis    Past Surgical History:  Procedure Laterality Date  . BARIATRIC SURGERY    . CESAREAN SECTION     3 or more  . GASTRIC BYPASS    . TUBAL LIGATION       Current Meds  Medication Sig  . aspirin 81 MG EC tablet TAKE 1 TABLET BY MOUTH EVERY DAY  . atorvastatin (LIPITOR) 40 MG tablet Take 1 tablet (40 mg total) by mouth daily.  . Azelastine HCl 137 MCG/SPRAY SOLN Place 2 sprays into the nose daily. 2 sprays in each nostril daily  . beclomethasone (QVAR) 80 MCG/ACT inhaler Inhale 2 puffs into the lungs 2 (two) times daily. Rinse mouth  after use.  . carbamazepine (TEGRETOL) 200 MG tablet TAKE 1 TABLET (200 MG TOTAL) BY MOUTH 3 (THREE) TIMES DAILY.  Marland Kitchen Cholecalciferol (VITAMIN D3 ADULT GUMMIES PO) Take 2 each by mouth daily.   . ferrous sulfate (IRON SUPPLEMENT) 325 (65 FE) MG tablet Take 325 mg by mouth daily with breakfast.  . fluticasone (FLONASE) 50 MCG/ACT nasal spray SPRAY 2 SPRAYS INTO EACH NOSTRIL EVERY DAY  . hydrochlorothiazide (MICROZIDE) 12.5 MG capsule TAKE 1 CAPSULE BY MOUTH EVERY DAY  . linaclotide (LINZESS) 145 MCG CAPS capsule Take 1 capsule (145 mcg total) by mouth daily before breakfast.  . loratadine (CLARITIN) 10 MG tablet Take 1 tablet (10 mg total) by mouth daily.  Marland Kitchen losartan (COZAAR) 25 MG tablet Take 1 tablet (25 mg total) by mouth daily.  . magnesium oxide (MAG-OX) 400 MG tablet Take 400 mg by mouth daily. Reported on 12/08/2015  . Multiple Vitamins-Minerals (CENTRUM SILVER) tablet Take 1 tablet by mouth daily.  Marland Kitchen omeprazole (PRILOSEC) 20 MG capsule Take 1 capsule (20 mg total) by mouth daily.  . QUEtiapine (SEROQUEL) 25 MG tablet Take 1 tablet (25 mg total) by mouth every evening. For sleep  . QVAR REDIHALER 80 MCG/ACT inhaler TAKE 1 PUFF BY MOUTH TWICE A DAY  . ranitidine (ZANTAC) 150 MG tablet TAKE 1 TABLET (150 MG TOTAL) BY MOUTH DAILY AS NEEDED FOR HEARTBURN.  . traMADol (ULTRAM) 50 MG tablet Take 1 tablet (50 mg total) by mouth every 8 (eight) hours as needed.     Allergies:   Contrast media [iodinated diagnostic agents] and Shellfish allergy   Social History   Tobacco Use  . Smoking status: Never Smoker  . Smokeless tobacco: Never Used  Substance Use Topics  . Alcohol use: Yes    Alcohol/week: 0.0 standard drinks    Comment: occasionally  . Drug use: No     Family Hx: The patient's family history includes Alcohol abuse in her brother and father; Colon cancer in her sister; Diabetes Mellitus II in her sister; Heart disease in her mother; Hypercholesterolemia in her mother; Hypertension  in her maternal grandmother and mother; Lung cancer in her brother. There is no history of Breast cancer.  ROS:   Please see the history of present illness.     All other systems reviewed and are negative.   Prior CV studies:   The following studies were reviewed today:  2D Echo 11/2015: - Left ventricle: The cavity size was normal. There was mild concentric hypertrophy. Systolic function was mildly to moderately reduced. The estimated ejection fraction was in the range of 40% to 45%. Mild diffuse hypokinesis. Regional wal motion abnormalities cannot be excluded. The study is not technically sufficient to allow evaluation of LV diastolic function. - Left atrium: The atrium was normal in size. -  Right ventricle: Systolic function was normal. - Pulmonary arteries: Systolic pressure was within the normal   range. __________  Myoview 10/2015: Pharmacological myocardial perfusion imaging study with no significant ischemia Normal wall motion, EF estimated at 57% No EKG changes concerning for ischemia at peak stress or in recovery. Low risk scan  Labs/Other Tests and Data Reviewed:    EKG:  No ECG reviewed.  Recent Labs: 11/27/2018: ALT 17; BUN 22; Creat 1.50; Hemoglobin 11.0; Platelets 202; Potassium 4.7; Sodium 139   Recent Lipid Panel Lab Results  Component Value Date/Time   CHOL 187 02/24/2018 12:36 PM   CHOL 256 (H) 06/28/2015 11:03 AM   TRIG 67 02/24/2018 12:36 PM   HDL 84 02/24/2018 12:36 PM   HDL 92 06/28/2015 11:03 AM   CHOLHDL 2.2 02/24/2018 12:36 PM   LDLCALC 88 02/24/2018 12:36 PM    Wt Readings from Last 3 Encounters:  01/20/19 227 lb (103 kg)  11/27/18 223 lb 9.6 oz (101.4 kg)  10/02/18 219 lb 12.8 oz (99.7 kg)     Objective:    Vital Signs:  BP 107/70   Pulse 76   Ht 6' (1.829 m)   Wt 227 lb (103 kg)   BMI 30.79 kg/m    VITAL SIGNS:  reviewed GEN:  no acute distress EYES:  sclerae anicteric, EOMI - Extraocular Movements Intact Trace bilateral pitting  ankle edema  ASSESSMENT & PLAN:    1. Cardiomyopathy: Uncertain if this is ICM vs NICM. Prior stress test in 10/2015 showed no significant ischemia with a normal EF. However, subsequent echo in 11/2015 showed a new cardiomyopathy as outlined above. She has noted a gradual increase in weight with associated bilateral ankle edema, which has improved on the addition of HCTZ. She is on losartan, which will be continued. With her relative hypotension I am hesitant to add beta blocker at this time, though this will need to be considered in follow up. Check echo to evaluate her cardiomyopathy. If her EF remains reduced she will need ischemic evaluation via [facility] or coronary CTA. However, she will need to be pre-medicated secondary to contrast allergy. Check BMP.   2. Aortic atherosclerosis: Incidentally noted on prior CT scan. No symptoms of angina at this time. Aggressive risk factor modification. Continue ASA and Lipitor.   3. CKD stage III: Renal function at her approximate baseline in 11/2018. Check BMP.   4. HTN: BP has been on the softer side with the addition of HCTZ, though she is asymptomatic. Continue current therapy for now.   5. HLD: LDL of 88 from 02/2018. Continue Lipitor.    COVID-19 Education: The signs and symptoms of COVID-19 were discussed with the patient and how to seek care for testing (follow up with PCP or arrange E-visit).  The importance of social distancing was discussed today.  Time:   Today, I have spent 17 minutes with the patient with telehealth technology discussing the above problems.     Medication Adjustments/Labs and Tests Ordered: Current medicines are reviewed at length with the patient today.  Concerns regarding medicines are outlined above.   Tests Ordered: No orders of the defined types were placed in this encounter.   Medication Changes: No orders of the defined types were placed in this encounter.   Disposition:  Follow up in 1 month(s) (in person)   Signed, Christell Faith, [name]  01/20/2019 11:20 AM    [place]

## 2019-01-20 ENCOUNTER — Telehealth (INDEPENDENT_AMBULATORY_CARE_PROVIDER_SITE_OTHER): Payer: Medicare Other | Admitting: Physician Assistant

## 2019-01-20 ENCOUNTER — Encounter: Payer: Self-pay | Admitting: Physician Assistant

## 2019-01-20 ENCOUNTER — Other Ambulatory Visit: Payer: Self-pay

## 2019-01-20 VITALS — BP 107/70 | HR 76 | Ht 72.0 in | Wt 227.0 lb

## 2019-01-20 DIAGNOSIS — E782 Mixed hyperlipidemia: Secondary | ICD-10-CM

## 2019-01-20 DIAGNOSIS — N183 Chronic kidney disease, stage 3 unspecified: Secondary | ICD-10-CM

## 2019-01-20 DIAGNOSIS — I700 Atherosclerosis of aorta: Secondary | ICD-10-CM

## 2019-01-20 DIAGNOSIS — I10 Essential (primary) hypertension: Secondary | ICD-10-CM

## 2019-01-20 DIAGNOSIS — I429 Cardiomyopathy, unspecified: Secondary | ICD-10-CM | POA: Diagnosis not present

## 2019-01-20 DIAGNOSIS — I129 Hypertensive chronic kidney disease with stage 1 through stage 4 chronic kidney disease, or unspecified chronic kidney disease: Secondary | ICD-10-CM | POA: Diagnosis not present

## 2019-01-20 NOTE — Patient Instructions (Signed)
It was a pleasure to speak with you on the phone today! Thank you for allowing [place] to continue taking care of your [facility] needs during this time.   Feel free to call as needed for questions and concerns related to your cardiac needs.   Medication Instructions:  Your physician recommends that you continue on your current medications as directed. Please refer to the Current Medication list given to you today.  If you need a refill on your cardiac medications before your next appointment, please call your pharmacy.   Lab work: Your physician recommends that you return for lab work this week [facility] mall. Lab needed ([facility]) No appt is needed. Hours are M-F 7AM- 6 PM.  If you have labs (blood work) drawn today and your tests are completely normal, you will receive your results only by: Marland Kitchen MyChart Message (if you have MyChart) OR . A paper copy in the mail If you have any lab test that is abnormal or we need to change your treatment, we will call you to review the results.  Testing/Procedures: 1- Echo  Please return to [facility] on ______________ at _______________ AM/PM for an Echocardiogram. Your physician has requested that you have an echocardiogram. Echocardiography is a painless test that uses sound waves to create images of your heart. It provides your doctor with information about the size and shape of your heart and how well your heart's chambers and valves are working. This procedure takes approximately one hour. There are no restrictions for this procedure. Please note; depending on visual quality an IV may need to be placed.  The office will call you tomorrow to set up appointments.   Follow-Up: At [facility], you and your health needs are our priority.  As part of our continuing mission to provide you with exceptional heart care, we have created designated Provider Care Teams.  These Care Teams include your primary Cardiologist (physician) and Advanced  Practice Providers (APPs -  Physician Assistants and Nurse Practitioners) who all work together to provide you with the care you need, when you need it. You will need a follow up in person appointment in 1 months.   You may see Melissa Rogue, MD or Melissa Faith, [name].

## 2019-01-21 ENCOUNTER — Other Ambulatory Visit: Payer: Self-pay | Admitting: Family Medicine

## 2019-01-21 DIAGNOSIS — I10 Essential (primary) hypertension: Secondary | ICD-10-CM

## 2019-01-22 ENCOUNTER — Telehealth: Payer: Self-pay | Admitting: *Deleted

## 2019-01-22 ENCOUNTER — Other Ambulatory Visit
Admission: RE | Admit: 2019-01-22 | Discharge: 2019-01-22 | Disposition: A | Discharge status: Home / Self Care | Payer: Medicare Other | Source: Ambulatory Visit | Attending: Physician Assistant | Admitting: Physician Assistant

## 2019-01-22 DIAGNOSIS — N183 Chronic kidney disease, stage 3 unspecified: Secondary | ICD-10-CM

## 2019-01-22 DIAGNOSIS — I429 Cardiomyopathy, unspecified: Secondary | ICD-10-CM

## 2019-01-22 DIAGNOSIS — I10 Essential (primary) hypertension: Secondary | ICD-10-CM

## 2019-01-22 DIAGNOSIS — Z79899 Other long term (current) drug therapy: Secondary | ICD-10-CM

## 2019-01-22 LAB — BASIC METABOLIC PANEL
Anion gap: 9 (ref 5–15)
BUN: 35 mg/dL — ABNORMAL HIGH (ref 8–23)
CO2: 26 mmol/L (ref 22–32)
Calcium: 9 mg/dL (ref 8.9–10.3)
Chloride: 104 mmol/L (ref 98–111)
Creatinine, Ser: 1.64 mg/dL — ABNORMAL HIGH (ref 0.44–1.00)
GFR calc Af Amer: 38 mL/min — ABNORMAL LOW (ref >60)
GFR calc non Af Amer: 32 mL/min — ABNORMAL LOW (ref >60)
Glucose, Bld: 144 mg/dL — ABNORMAL HIGH (ref 70–99)
Potassium: 4.7 mmol/L (ref 3.5–5.1)
Sodium: 139 mmol/L (ref 135–145)

## 2019-01-22 NOTE — Telephone Encounter (Signed)
Called patient and she verbalized understanding of results, to stop HCTZ, to follow up with PCP about glucose elevation, and to go to [facility] in 1 week for lab work.

## 2019-01-22 NOTE — Telephone Encounter (Signed)
-----   Message from Melissa Mu, [name] sent at 01/22/2019  4:00 PM EDT ----- Renal function has worsened some with HCTZ. She should stop HCTZ. Recheck BMET one week off of HCTZ to evaluate for renal function improvement back to baseline. For continue losartan. Keep planned echo to evaluate her cardiomyopathy. Random glucose is elevated. She should follow up with her PCP regarding this. Most recent A1c of 5.4 from 02/2018.

## 2019-01-23 ENCOUNTER — Other Ambulatory Visit: Payer: Self-pay | Admitting: Family Medicine

## 2019-01-23 DIAGNOSIS — I700 Atherosclerosis of aorta: Secondary | ICD-10-CM

## 2019-02-01 ENCOUNTER — Other Ambulatory Visit: Payer: Self-pay | Admitting: Family Medicine

## 2019-02-01 DIAGNOSIS — I10 Essential (primary) hypertension: Secondary | ICD-10-CM

## 2019-02-05 ENCOUNTER — Ambulatory Visit (INDEPENDENT_AMBULATORY_CARE_PROVIDER_SITE_OTHER): Payer: Medicare Other | Admitting: Family Medicine

## 2019-02-05 ENCOUNTER — Encounter: Payer: Self-pay | Admitting: Family Medicine

## 2019-02-05 ENCOUNTER — Other Ambulatory Visit: Payer: Self-pay

## 2019-02-05 VITALS — BP 136/84 | HR 83 | Temp 97.9°F | Resp 16 | Ht 72.0 in | Wt 231.7 lb

## 2019-02-05 DIAGNOSIS — N183 Chronic kidney disease, stage 3 unspecified: Secondary | ICD-10-CM

## 2019-02-05 DIAGNOSIS — E669 Obesity, unspecified: Secondary | ICD-10-CM

## 2019-02-05 DIAGNOSIS — M5416 Radiculopathy, lumbar region: Secondary | ICD-10-CM | POA: Diagnosis not present

## 2019-02-05 DIAGNOSIS — M25472 Effusion, left ankle: Secondary | ICD-10-CM | POA: Diagnosis not present

## 2019-02-05 DIAGNOSIS — M5136 Other intervertebral disc degeneration, lumbar region: Secondary | ICD-10-CM | POA: Diagnosis not present

## 2019-02-05 DIAGNOSIS — M25471 Effusion, right ankle: Secondary | ICD-10-CM

## 2019-02-05 DIAGNOSIS — M48062 Spinal stenosis, lumbar region with neurogenic claudication: Secondary | ICD-10-CM | POA: Diagnosis not present

## 2019-02-05 DIAGNOSIS — M47816 Spondylosis without myelopathy or radiculopathy, lumbar region: Secondary | ICD-10-CM | POA: Diagnosis not present

## 2019-02-05 DIAGNOSIS — I10 Essential (primary) hypertension: Secondary | ICD-10-CM | POA: Diagnosis not present

## 2019-02-05 NOTE — Progress Notes (Signed)
Name: Melissa [name]   MRN: [id number]    DOB: [birth year]   Date:02/05/2019       Progress Note  Subjective  Chief Complaint  Chief Complaint  Patient presents with  . Discuss Medications    Dr. Rockey Situ stopped patient HCTZ medication because it could be messing up kidney functions.  . Hypertension    Denies any symptoms    HPI  HTN: patient was seen by Dr. Rockey Situ and HCTZ was stopped because of slightly drop on GFR, explained it is stable, we can recheck in July during her well exam. She states bp has been at goal, she has not noticed worsening of her edema without HCTZ. No chest pain or palpitation. She has CKI stage III   Patient Active Problem List   Diagnosis Date Noted  . Trigeminal neuralgia 09/11/2018  . Cardiac LV ejection fraction of 40-49% 05/07/2017  . Low back pain without sciatica 02/21/2017  . Renal cyst 10/18/2016  . Atherosclerosis of aorta ([facility]) 10/16/2016  . History of colonic polyps 09/25/2016  . Dyslipidemia 12/08/2015  . Gastroesophageal reflux disease without esophagitis 08/11/2015  . Chronic kidney disease (CKD), stage III (moderate) ([place]) 07/02/2015  . Primary osteoarthritis of knee 06/13/2015  . Hip bursitis 06/13/2015  . RLS (restless legs syndrome) 06/13/2015  . Depression, major, in remission ([place]) 06/13/2015  . B12 deficiency 06/13/2015  . History of Roux-en-Y gastric bypass 06/13/2015  . History of iron deficiency anemia 06/13/2015  . Vitamin D deficiency 06/13/2015  . Hypertension, benign 06/13/2015  . Perennial allergic rhinitis with seasonal variation 06/13/2015  . Obesity (BMI 30.0-34.9) 06/13/2015  . Cervical radiculitis 01/04/2014  . Cervical spinal stenosis 01/04/2014  . Cervical osteoarthritis 01/04/2014    Past Surgical History:  Procedure Laterality Date  . BARIATRIC SURGERY    . CESAREAN SECTION     3 or more  . GASTRIC BYPASS    . TUBAL LIGATION      Family History  Problem Relation Age of Onset  .  Hypercholesterolemia Mother   . Heart disease Mother   . Hypertension Mother   . Alcohol abuse Father   . Lung cancer Brother   . Alcohol abuse Brother   . Diabetes Mellitus II Sister   . Hypertension Maternal Grandmother   . Colon cancer Sister   . Breast cancer Neg Hx     Social History   Socioeconomic History  . Marital status: Married    Spouse name: Keyasia Jolliff  . Number of children: 4  . Years of education: Not on file  . Highest education level: Some college, no degree  Occupational History  . Not on file  Social Needs  . Financial resource strain: Not hard at all  . Food insecurity    Worry: Never true    Inability: Never true  . Transportation needs    Medical: No    Non-medical: No  Tobacco Use  . Smoking status: Never Smoker  . Smokeless tobacco: Never Used  Substance and Sexual Activity  . Alcohol use: Yes    Alcohol/week: 0.0 standard drinks    Comment: occasionally  . Drug use: No  . Sexual activity: Yes    Partners: Male    Birth control/protection: Post-menopausal  Lifestyle  . Physical activity    Days per week: 0 days    Minutes per session: 0 min  . Stress: Not at all  Relationships  . Social connections    Talks on phone: More than three times  a week    Gets together: More than three times a week    Attends religious service: Never    Active member of club or organization: No    Attends meetings of clubs or organizations: Never    Relationship status: Married  . Intimate partner violence    Fear of current or ex partner: No    Emotionally abused: No    Physically abused: No    Forced sexual activity: No  Other Topics Concern  . Not on file  Social History Narrative  . Not on file     Current Outpatient Medications:  .  aspirin 81 MG EC tablet, TAKE 1 TABLET BY MOUTH EVERY DAY, Disp: 30 tablet, Rfl: 0 .  atorvastatin (LIPITOR) 40 MG tablet, TAKE 1 TABLET BY MOUTH EVERY DAY, Disp: 90 tablet, Rfl: 1 .  Azelastine HCl 137 MCG/SPRAY  SOLN, Place 2 sprays into the nose daily. 2 sprays in each nostril daily, Disp: 1 Bottle, Rfl: 5 .  beclomethasone (QVAR) 80 MCG/ACT inhaler, Inhale 2 puffs into the lungs 2 (two) times daily. Rinse mouth after use., Disp: 1 Inhaler, Rfl: 6 .  carbamazepine (TEGRETOL) 200 MG tablet, TAKE 1 TABLET (200 MG TOTAL) BY MOUTH 3 (THREE) TIMES DAILY., Disp: 90 tablet, Rfl: 0 .  Cholecalciferol (VITAMIN D3 ADULT GUMMIES PO), Take 2 each by mouth daily. , Disp: , Rfl:  .  ferrous sulfate (IRON SUPPLEMENT) 325 (65 FE) MG tablet, Take 325 mg by mouth daily with breakfast., Disp: , Rfl:  .  fluticasone (FLONASE) 50 MCG/ACT nasal spray, SPRAY 2 SPRAYS INTO EACH NOSTRIL EVERY DAY, Disp: 16 g, Rfl: 3 .  loratadine (CLARITIN) 10 MG tablet, Take 1 tablet (10 mg total) by mouth daily., Disp: 90 tablet, Rfl: 0 .  losartan (COZAAR) 25 MG tablet, TAKE 1 TABLET BY MOUTH EVERY DAY, Disp: 30 tablet, Rfl: 0 .  magnesium oxide (MAG-OX) 400 MG tablet, Take 400 mg by mouth daily. Reported on 12/08/2015, Disp: , Rfl:  .  Multiple Vitamins-Minerals (CENTRUM SILVER) tablet, Take 1 tablet by mouth daily., Disp: 30 tablet, Rfl: 0 .  omeprazole (PRILOSEC) 20 MG capsule, Take 1 capsule (20 mg total) by mouth daily., Disp: 30 capsule, Rfl: 11 .  ranitidine (ZANTAC) 150 MG tablet, TAKE 1 TABLET (150 MG TOTAL) BY MOUTH DAILY AS NEEDED FOR HEARTBURN., Disp: 90 tablet, Rfl: 0 .  traMADol (ULTRAM) 50 MG tablet, Take 1 tablet (50 mg total) by mouth every 8 (eight) hours as needed., Disp: 90 tablet, Rfl: 0 .  linaclotide (LINZESS) 145 MCG CAPS capsule, Take 1 capsule (145 mcg total) by mouth daily before breakfast. (Patient not taking: Reported on 02/05/2019), Disp: 90 capsule, Rfl: 1 .  QUEtiapine (SEROQUEL) 25 MG tablet, Take 1 tablet (25 mg total) by mouth every evening. For sleep (Patient not taking: Reported on 02/05/2019), Disp: 90 tablet, Rfl: 0 .  QVAR REDIHALER 80 MCG/ACT inhaler, TAKE 1 PUFF BY MOUTH TWICE A DAY (Patient not taking:  Reported on 02/05/2019), Disp: 10.6 g, Rfl: 2  Allergies  Allergen Reactions  . Contrast Media [Iodinated Diagnostic Agents]   . Shellfish Allergy     Edema    I personally reviewed active problem list, medication list, allergies, family history, social history with the patient/caregiver today.   ROS  Constitutional: Negative for fever or weight change.  Respiratory: Negative for cough and shortness of breath.   Cardiovascular: Negative for chest pain or palpitations.  Gastrointestinal: Negative for abdominal pain, no bowel  changes.  Musculoskeletal: Negative for gait problem or joint swelling. positive for ankle edema  Skin: Negative for rash.  Neurological: Negative for dizziness or headache.  No other specific complaints in a complete review of systems (except as listed in HPI above).  Objective  Vitals:   02/05/19 1135  BP: 136/84  Pulse: 83  Resp: 16  Temp: 97.9 F (36.6 C)  TempSrc: Oral  SpO2: 98%  Weight: 231 lb 11.2 oz (105.1 kg)  Height: 6' (1.829 m)    Body mass index is 31.42 kg/m.  Physical Exam  Constitutional: Patient appears well-developed and well-nourished. Obese  No distress.  HEENT: head atraumatic, normocephalic, pupils equal and reactive to light, neck supple Cardiovascular: Normal rate, regular rhythm and normal heart sounds.  No murmur heard. Trace BLE ankle edema. Pulmonary/Chest: Effort normal and breath sounds normal. No respiratory distress. Abdominal: Soft.  There is no tenderness. Psychiatric: Patient has a normal mood and affect. behavior is normal. Judgment and thought content normal.  Recent Results (from the past 2160 hour(s))  POCT Urinalysis Dipstick     Status: Abnormal   Collection Time: 11/27/18 10:49 AM  Result Value Ref Range   Color, UA yellow    Clarity - urine clear    Glucose, UA Negative Negative   Bilirubin, UA Negative    Ketones, UA Negative    Spec Grav, UA 1.010 1.010 - 1.025   Blood, UA Positive    pH, UA  6.5 5.0 - 8.0   Protein, UA Negative Negative   Urobilinogen, UA 0.2 0.2 or 1.0 E.U./dL   Nitrite, UA Negative    Leukocytes, UA Negative Negative   Appearance Normal    Odor Normal   CULTURE, URINE COMPREHENSIVE     Status: Abnormal   Collection Time: 11/27/18 11:13 AM   Specimen: Urine  Result Value Ref Range   MICRO NUMBER: [id number]    SPECIMEN QUALITY: Adequate    Source OTHER (SPECIFY)    STATUS: FINAL    ISOLATE 1: Escherichia coli (A)     Comment: 50,000-100,000 CFU/mL of Escherichia coli   ISOLATE 2: Enterococcus faecalis (A)     Comment: 1,000-10,000 CFU/mL of Enterococcus faecalis      Susceptibility   Escherichia coli - CULT, URN, SPECIAL NEGATIVE 1    AMOX/CLAVULANIC <=2 Sensitive     AMPICILLIN <=2 Sensitive     AMPICILLIN/SULBACTAM <=2 Sensitive     CEFAZOLIN* <=4 Not Reportable      * For infections other than uncomplicated UTIcaused by E. coli, K. pneumoniae or P. mirabilis:Cefazolin is resistant if MIC > or = 8 mcg/mL.(Distinguishing susceptible versus intermediatefor isolates with MIC < or = 4 mcg/mL requiresadditional testing.)For uncomplicated UTI caused by E. coli,K. pneumoniae or P. mirabilis: Cefazolin issusceptible if MIC <32 mcg/mL and predictssusceptible to the oral agents cefaclor, cefdinir,cefpodoxime, cefprozil, cefuroxime, cephalexinand loracarbef.    CEFEPIME <=1 Sensitive     CEFTRIAXONE <=1 Sensitive     CIPROFLOXACIN <=0.25 Sensitive     LEVOFLOXACIN <=0.12 Sensitive     ERTAPENEM <=0.5 Sensitive     GENTAMICIN <=1 Sensitive     IMIPENEM <=0.25 Sensitive     NITROFURANTOIN <=16 Sensitive     PIP/TAZO <=4 Sensitive     TOBRAMYCIN <=1 Sensitive     TRIMETH/SULFA <=20 Sensitive    Enterococcus faecalis - CULT, URN, SPECIAL POSITIVE 2    VANCOMYCIN 1 Sensitive     AMPICILLIN <=2 Sensitive     NITROFURANTOIN* <=16 Sensitive      *  For infections other than uncomplicated UTIcaused by E. coli, K. pneumoniae or P. mirabilis:Cefazolin is resistant if  MIC > or = 8 mcg/mL.(Distinguishing susceptible versus intermediatefor isolates with MIC < or = 4 mcg/mL requiresadditional testing.)For uncomplicated UTI caused by E. coli,K. pneumoniae or P. mirabilis: Cefazolin issusceptible if MIC <32 mcg/mL and predictssusceptible to the oral agents cefaclor, cefdinir,cefpodoxime, cefprozil, cefuroxime, cephalexinand loracarbef.Legend:S = Susceptible  I = IntermediateR = Resistant  NS = Not susceptible* = Not tested  NR = Not reported**NN = See antimicrobic comments  CBC with Differential/Platelet     Status: Abnormal   Collection Time: 11/27/18 11:13 AM  Result Value Ref Range   WBC 4.7 3.8 - 10.8 Thousand/uL   RBC 3.62 (L) 3.80 - 5.10 Million/uL   Hemoglobin 11.0 (L) 11.7 - 15.5 g/dL   HCT 33.2 (L) 35.0 - 45.0 %   MCV 91.7 80.0 - 100.0 fL   MCH 30.4 27.0 - 33.0 pg   MCHC 33.1 32.0 - 36.0 g/dL   RDW 12.7 11.0 - 15.0 %   Platelets 202 140 - 400 Thousand/uL   MPV 10.2 7.5 - 12.5 fL   Neutro Abs 1,772 1,500 - 7,800 cells/uL   Lymphs Abs 2,209 850 - 3,900 cells/uL   Absolute Monocytes 555 200 - 950 cells/uL   Eosinophils Absolute 127 15 - 500 cells/uL   Basophils Absolute 38 0 - 200 cells/uL   Neutrophils Relative % 37.7 %   Total Lymphocyte 47.0 %   Monocytes Relative 11.8 %   Eosinophils Relative 2.7 %   Basophils Relative 0.8 %  COMPLETE METABOLIC PANEL WITH GFR     Status: Abnormal   Collection Time: 11/27/18 11:13 AM  Result Value Ref Range   Glucose, Bld 90 65 - 99 mg/dL    Comment: .            Fasting reference interval .    BUN 22 7 - 25 mg/dL   Creat 1.50 (H) 0.50 - 0.99 mg/dL    Comment: For patients >15 years of age, the reference limit for Creatinine is approximately 13% higher for people identified as African-American. .    GFR, Est Non African American 36 (L) > OR = 60 mL/min/1.45m2   GFR, Est African American 42 (L) > OR = 60 mL/min/1.42m2   BUN/Creatinine Ratio 15 6 - 22 (calc)   Sodium 139 135 - 146 mmol/L   Potassium  4.7 3.5 - 5.3 mmol/L   Chloride 106 98 - 110 mmol/L   CO2 25 20 - 32 mmol/L   Calcium 8.9 8.6 - 10.4 mg/dL   Total Protein 6.4 6.1 - 8.1 g/dL   Albumin 3.8 3.6 - 5.1 g/dL   Globulin 2.6 1.9 - 3.7 g/dL (calc)   AG Ratio 1.5 1.0 - 2.5 (calc)   Total Bilirubin 0.3 0.2 - 1.2 mg/dL   Alkaline phosphatase (APISO) 78 37 - 153 U/L   AST 25 10 - 35 U/L   ALT 17 6 - 29 U/L  Iron, TIBC and Ferritin Panel     Status: None   Collection Time: 11/27/18 11:13 AM  Result Value Ref Range   Iron 102 45 - 160 mcg/dL   TIBC 251 250 - 450 mcg/dL (calc)   %SAT 41 16 - 45 % (calc)   Ferritin 40 16 - 288 ng/mL  Basic metabolic panel     Status: Abnormal   Collection Time: 01/22/19  3:21 PM  Result Value Ref Range   Sodium 139  135 - 145 mmol/L   Potassium 4.7 3.5 - 5.1 mmol/L   Chloride 104 98 - 111 mmol/L   CO2 26 22 - 32 mmol/L   Glucose, Bld 144 (H) 70 - 99 mg/dL   BUN 35 (H) 8 - 23 mg/dL   Creatinine, Ser 1.64 (H) 0.44 - 1.00 mg/dL   Calcium 9.0 8.9 - 10.3 mg/dL   GFR calc non Af Amer 32 (L) >60 mL/min   GFR calc Af Amer 38 (L) >60 mL/min   Anion gap 9 5 - 15    Comment: Performed at [facility], [address] [place], [place] [address]      PHQ2/9: Depression screen [facility] 2/9 02/05/2019 11/27/2018 10/02/2018 07/02/2018 02/24/2018  Decreased Interest 0 0 0 0 0  Down, Depressed, Hopeless 0 0 0 0 0  PHQ - 2 Score 0 0 0 0 0  Altered sleeping 0 0 0 3 0  Tired, decreased energy 0 0 1 3 3   Change in appetite 2 0 0 0 0  Feeling bad or failure about yourself  0 0 0 0 0  Trouble concentrating 0 0 0 0 0  Moving slowly or fidgety/restless 0 0 0 0 0  Suicidal thoughts 0 0 0 0 0  PHQ-9 Score 2 0 1 6 3   Difficult doing work/chores Not difficult at all - Not difficult at all Not difficult at all Not difficult at all    phq 9 is negative   Fall Risk: Fall Risk  02/05/2019 07/02/2018 02/24/2018 11/18/2017 07/25/2017  Falls in the past year? 0 0 No No No  Number falls in past yr: 0 - - - -   Injury with Fall? 0 - - - -    Functional Status Survey: Is the patient deaf or have difficulty hearing?: No Does the patient have difficulty seeing, even when wearing glasses/contacts?: Yes Does the patient have difficulty concentrating, remembering, or making decisions?: No Does the patient have difficulty walking or climbing stairs?: No Does the patient have difficulty dressing or bathing?: No Does the patient have difficulty doing errands alone such as visiting a doctor's office or shopping?: No    Assessment & Plan  1. Hypertension, benign  Off HCTZ, on ARB and bp is at goal   2. Obesity (BMI 30.0-34.9)  She states she know what to do, she states husband buys her junk food and will stop that, she will try to exercise more   3. Ankle edema, bilateral  Discussed walking, compression stocking hoses, avoid sitting   4. Chronic kidney disease (CKD), stage III (moderate) ([facility])  Discussed labs done by Dr. Rockey Situ, stable since BUN was a little high, likely from dehydration

## 2019-02-10 ENCOUNTER — Telehealth: Payer: Self-pay

## 2019-02-10 NOTE — Telephone Encounter (Signed)

## 2019-02-11 ENCOUNTER — Other Ambulatory Visit: Payer: Self-pay

## 2019-02-11 ENCOUNTER — Ambulatory Visit (INDEPENDENT_AMBULATORY_CARE_PROVIDER_SITE_OTHER): Payer: Medicare Other

## 2019-02-11 DIAGNOSIS — I429 Cardiomyopathy, unspecified: Secondary | ICD-10-CM

## 2019-02-17 DIAGNOSIS — G500 Trigeminal neuralgia: Secondary | ICD-10-CM | POA: Diagnosis not present

## 2019-02-18 ENCOUNTER — Telehealth: Payer: Self-pay

## 2019-02-18 NOTE — Telephone Encounter (Signed)
Pt made aware of echo results with verbal understanding. 

## 2019-02-18 NOTE — Telephone Encounter (Signed)
-----   Message from Rise Mu, [name] sent at 02/18/2019 11:45 AM EDT ----- Echo showed low normal pump function with an EF of 50 to 55%, normal relaxation of the heart, normal wall motion of the heart, normal pressure along the right side of the heart, normal aortic root and ascending aorta in size and structure trivially leaky mitral valve.  When compared to prior echo from 2017, pump function has improved and is near normal.  Continue losartan.  She is not on a beta-blocker at this time in the setting of relative hypotension.  I see she has virtual follow-up with her primary cardiologist on 02/23/2019.  At that time, perhaps we would be able to add a beta-blocker and can discuss potential need for further ischemic evaluation if indicated based on her symptoms.

## 2019-02-22 NOTE — Progress Notes (Signed)
Virtual Visit via Video Note   This visit type was conducted due to national recommendations for restrictions regarding the COVID-19 Pandemic (e.g. social distancing) in an effort to limit this patient's exposure and mitigate transmission in our community.  Due to her co-morbid illnesses, this patient is at least at moderate risk for complications without adequate follow up.  This format is felt to be most appropriate for this patient at this time.  All issues noted in this document were discussed and addressed.  A limited physical exam was performed with this format.  Please refer to the patient's chart for her consent to telehealth for [facility].   I connected with  Melissa [name] on 02/23/19 by a video enabled telemedicine application and verified that I am speaking with the correct person using two identifiers. I discussed the limitations of evaluation and management by telemedicine. The patient expressed understanding and agreed to proceed.   Evaluation Performed:  Follow-up visit  Date:  02/23/2019   ID:  Melissa [name], DOB 10-06-1950, MRN [id number]  Patient Location:  [address] [place] [address]   Provider location:   [facility], [place] office  PCP:  Steele Sizer, MD  Cardiologist:  Patsy Baltimore   Chief Complaint: Leg swelling    History of Present Illness:    Melissa [name] is a 68 y.o. female who presents via audio/video conferencing for a telehealth visit today.   The patient does not symptoms concerning for COVID-19 infection (fever, chills, cough, or new SHORTNESS OF BREATH).   Patient has a past medical history of Atherosclerosis of Aorta seen on CT scan 2018:  hyperlipidemia CRI, likely secondary to polycystic kidney Depression Obesity, Roux-en-Y gastric bypass Back pain Who presents for routine follow-up of her aortic atherosclerosis  Echocardiogram February 11, 2019 Ejection fraction 50 to 55% Otherwise normal  study  11/2018 with increased ankle swelling and placed on HCTZ.  ankle edema  Improved to some degree on HCTZ, not all the way   weight has increased form 208 pounds when we last saw her to 227 pounds on prior clinic visit  she has attributed this to increased snacking dating back to 04/2018.  Double dogs, chips  Echo 02/11/2019 done in response to lower extremity edema  1. The left ventricle has low normal systolic function, with an ejection fraction of 50-55%. The cavity size was normal. Left ventricular diastolic parameters were normal. No evidence of left ventricular regional wall motion abnormalities.  2. The right ventricle has normal systolic function. The cavity was normal. There is no increase in right ventricular wall thickness. Right ventricular systolic pressure is normal with an estimated pressure of 22.0 mmHg.  Creatinine up to 1.6 with elevated BUN while on she was on HCTZ, HCTZ held Has not had follow-up lab work since that time  Followed by nephrology, Dr. Abigail Butts She does have baseline anemia, hemoglobin 11 in April 2020  Other past medical history reviewed CT scan abdomen pelvis  mild aortic atherosclerosis extending into the common iliac arteries And kidney stone noted  Myoview in 10/2015 showed no evidence of significant ischemia, EF 57%, low risk scan.   Echo on 11/24/2015 showed an EF of 35-40%, severe hypokinesis of the mid-apicalanteroseptal and anterior myocardium, Gr1DD, mild TR, PASP normal.   echo on 12/05/2015 to reassess the EF showed an EF of 40-45%, mild diffuse hypokinesis, mild concentric LVH, normal size left atrium, RVSF normal, PASP normal.  Prior CV  studies:   The following studies were reviewed today:    Past Medical History:  Diagnosis Date  . Bruising   . Cervical radiculopathy   . Deficiency of vitamin B   . Depression   . Edema   . Flank pain   . Gross hematuria   . HTN (hypertension)   . Iron deficiency anemia   . Leukopenia   . OA  (osteoarthritis)   . Obesity   . OSA on CPAP   . Osteopenia   . Perennial allergic rhinitis   . Renal cysts, acquired, bilateral   . RLS (restless legs syndrome)   . Sickle cell trait ([place])   . Snoring   . Spinal stenosis    Past Surgical History:  Procedure Laterality Date  . BARIATRIC SURGERY    . CESAREAN SECTION     3 or more  . GASTRIC BYPASS    . TUBAL LIGATION       Current Meds  Medication Sig  . aspirin 81 MG EC tablet TAKE 1 TABLET BY MOUTH EVERY DAY  . atorvastatin (LIPITOR) 40 MG tablet TAKE 1 TABLET BY MOUTH EVERY DAY  . Azelastine HCl 137 MCG/SPRAY SOLN Place 2 sprays into the nose daily. 2 sprays in each nostril daily  . beclomethasone (QVAR) 80 MCG/ACT inhaler Inhale 2 puffs into the lungs 2 (two) times daily. Rinse mouth after use.  . carbamazepine (TEGRETOL) 200 MG tablet TAKE 1 TABLET (200 MG TOTAL) BY MOUTH 3 (THREE) TIMES DAILY.  Marland Kitchen Cholecalciferol (VITAMIN D3 ADULT GUMMIES PO) Take 2 each by mouth daily.   . ferrous sulfate (IRON SUPPLEMENT) 325 (65 FE) MG tablet Take 325 mg by mouth daily with breakfast.  . fluticasone (FLONASE) 50 MCG/ACT nasal spray SPRAY 2 SPRAYS INTO EACH NOSTRIL EVERY DAY  . loratadine (CLARITIN) 10 MG tablet Take 1 tablet (10 mg total) by mouth daily.  Marland Kitchen losartan (COZAAR) 25 MG tablet TAKE 1 TABLET BY MOUTH EVERY DAY  . magnesium oxide (MAG-OX) 400 MG tablet Take 400 mg by mouth daily. Reported on 12/08/2015  . Multiple Vitamins-Minerals (CENTRUM SILVER) tablet Take 1 tablet by mouth daily.  Marland Kitchen omeprazole (PRILOSEC) 20 MG capsule Take 1 capsule (20 mg total) by mouth daily.  Marland Kitchen QVAR REDIHALER 80 MCG/ACT inhaler TAKE 1 PUFF BY MOUTH TWICE A DAY  . ranitidine (ZANTAC) 150 MG tablet TAKE 1 TABLET (150 MG TOTAL) BY MOUTH DAILY AS NEEDED FOR HEARTBURN.  . traMADol (ULTRAM) 50 MG tablet Take 1 tablet (50 mg total) by mouth every 8 (eight) hours as needed.     Allergies:   Contrast media [iodinated diagnostic agents] and Shellfish allergy    Social History   Tobacco Use  . Smoking status: Never Smoker  . Smokeless tobacco: Never Used  Substance Use Topics  . Alcohol use: Yes    Alcohol/week: 0.0 standard drinks    Comment: occasionally  . Drug use: No     Current Outpatient Medications on File Prior to Visit  Medication Sig Dispense Refill  . aspirin 81 MG EC tablet TAKE 1 TABLET BY MOUTH EVERY DAY 30 tablet 0  . atorvastatin (LIPITOR) 40 MG tablet TAKE 1 TABLET BY MOUTH EVERY DAY 90 tablet 1  . Azelastine HCl 137 MCG/SPRAY SOLN Place 2 sprays into the nose daily. 2 sprays in each nostril daily 1 Bottle 5  . beclomethasone (QVAR) 80 MCG/ACT inhaler Inhale 2 puffs into the lungs 2 (two) times daily. Rinse mouth after use. 1 Inhaler 6  .  carbamazepine (TEGRETOL) 200 MG tablet TAKE 1 TABLET (200 MG TOTAL) BY MOUTH 3 (THREE) TIMES DAILY. 90 tablet 0  . Cholecalciferol (VITAMIN D3 ADULT GUMMIES PO) Take 2 each by mouth daily.     . ferrous sulfate (IRON SUPPLEMENT) 325 (65 FE) MG tablet Take 325 mg by mouth daily with breakfast.    . fluticasone (FLONASE) 50 MCG/ACT nasal spray SPRAY 2 SPRAYS INTO EACH NOSTRIL EVERY DAY 16 g 3  . loratadine (CLARITIN) 10 MG tablet Take 1 tablet (10 mg total) by mouth daily. 90 tablet 0  . losartan (COZAAR) 25 MG tablet TAKE 1 TABLET BY MOUTH EVERY DAY 30 tablet 0  . magnesium oxide (MAG-OX) 400 MG tablet Take 400 mg by mouth daily. Reported on 12/08/2015    . Multiple Vitamins-Minerals (CENTRUM SILVER) tablet Take 1 tablet by mouth daily. 30 tablet 0  . omeprazole (PRILOSEC) 20 MG capsule Take 1 capsule (20 mg total) by mouth daily. 30 capsule 11  . QVAR REDIHALER 80 MCG/ACT inhaler TAKE 1 PUFF BY MOUTH TWICE A DAY 10.6 g 2  . ranitidine (ZANTAC) 150 MG tablet TAKE 1 TABLET (150 MG TOTAL) BY MOUTH DAILY AS NEEDED FOR HEARTBURN. 90 tablet 0  . traMADol (ULTRAM) 50 MG tablet Take 1 tablet (50 mg total) by mouth every 8 (eight) hours as needed. 90 tablet 0   No current facility-administered  medications on file prior to visit.      Family Hx: The patient's family history includes Alcohol abuse in her brother and father; Colon cancer in her sister; Diabetes Mellitus II in her sister; Heart disease in her mother; Hypercholesterolemia in her mother; Hypertension in her maternal grandmother and mother; Lung cancer in her brother. There is no history of Breast cancer.  ROS:   Please see the history of present illness.    Review of Systems  Constitutional: Negative.   HENT: Negative.   Respiratory: Negative.   Cardiovascular: Positive for leg swelling.  Gastrointestinal: Negative.   Musculoskeletal: Negative.   Neurological: Negative.   Psychiatric/Behavioral: Negative.   All other systems reviewed and are negative.     Labs/Other Tests and Data Reviewed:    Recent Labs: 11/27/2018: ALT 17; Hemoglobin 11.0; Platelets 202 01/22/2019: BUN 35; Creatinine, Ser 1.64; Potassium 4.7; Sodium 139   Recent Lipid Panel Lab Results  Component Value Date/Time   CHOL 187 02/24/2018 12:36 PM   CHOL 256 (H) 06/28/2015 11:03 AM   TRIG 67 02/24/2018 12:36 PM   HDL 84 02/24/2018 12:36 PM   HDL 92 06/28/2015 11:03 AM   CHOLHDL 2.2 02/24/2018 12:36 PM   LDLCALC 88 02/24/2018 12:36 PM    Wt Readings from Last 3 Encounters:  02/23/19 231 lb (104.8 kg)  02/05/19 231 lb 11.2 oz (105.1 kg)  01/20/19 227 lb (103 kg)     Exam:    Vital Signs: Vital signs may also be detailed in the HPI Ht 6' (1.829 m)   Wt 231 lb (104.8 kg)   BMI 31.33 kg/m   Wt Readings from Last 3 Encounters:  02/23/19 231 lb (104.8 kg)  02/05/19 231 lb 11.2 oz (105.1 kg)  01/20/19 227 lb (103 kg)   Temp Readings from Last 3 Encounters:  02/05/19 97.9 F (36.6 C) (Oral)  11/27/18 97.8 F (36.6 C) (Oral)  10/02/18 (!) 97.5 F (36.4 C) (Oral)   BP Readings from Last 3 Encounters:  02/05/19 136/84  01/20/19 107/70  11/27/18 140/90   Pulse Readings from Last 3 Encounters:  02/05/19 83  01/20/19 76   11/27/18 75    130/80, pulse 70, respirations 16  Well nourished, well developed female in no acute distress. Constitutional:  oriented to person, place, and time. No distress.  Head: Normocephalic and atraumatic.  Eyes:  no discharge. No scleral icterus.  Neck: Normal range of motion. Neck supple.  Pulmonary/Chest: No audible wheezing, no distress, appears comfortable Musculoskeletal: Normal range of motion.  no  tenderness or deformity.  Neurological:   Coordination normal. Full exam not performed Skin:  No rash Psychiatric:  normal mood and affect. behavior is normal. Thought content normal.    ASSESSMENT & PLAN:    Problem List Items Addressed This Visit      Cardiology Problems   Atherosclerosis of aorta (HCC) - Primary   Hypertension, benign     Other   Chronic kidney disease (CKD), stage III (moderate) (HCC)    Other Visit Diagnoses    Mixed hyperlipidemia         Etiology of her lower extremity edema likely multifactorial, Anemia, possibly venous insufficiency In the setting of HCTZ, climbing creatinine consistent with prerenal state, no dramatic improvement in her leg edema Echocardiogram confirming normal right heart pressures but still with mild pitting edema around the ankle Suggested she wear compression hose  Anemia Likely related to underlying kidney failure Does not appear to be iron deficient Did not exclude GI blood losses She will discuss with nephrology  COVID-19 Education: The signs and symptoms of COVID-19 were discussed with the patient and how to seek care for testing (follow up with PCP or arrange E-visit).  The importance of social distancing was discussed today.  Patient Risk:   After full review of this patients clinical status, I feel that they are at least moderate risk at this time.  Time:   Today, I have spent 25 minutes with the patient with telehealth technology discussing the cardiac and medical problems/diagnoses detailed above    10 min spent reviewing the chart prior to patient visit today   Medication Adjustments/Labs and Tests Ordered: Current medicines are reviewed at length with the patient today.  Concerns regarding medicines are outlined above.   Tests Ordered: No tests ordered   Medication Changes: No changes made   Disposition: Follow-up in 12 months   Signed, Ida Rogue, MD  02/23/2019 11:58 AM    [place] Office [address] [address], [place], [place] [address]

## 2019-02-23 ENCOUNTER — Encounter: Payer: Self-pay | Admitting: Cardiovascular Disease

## 2019-02-23 ENCOUNTER — Other Ambulatory Visit: Payer: Self-pay

## 2019-02-23 ENCOUNTER — Telehealth (INDEPENDENT_AMBULATORY_CARE_PROVIDER_SITE_OTHER): Payer: Medicare Other | Admitting: Cardiovascular Disease

## 2019-02-23 VITALS — Ht 72.0 in | Wt 231.0 lb

## 2019-02-23 DIAGNOSIS — N183 Chronic kidney disease, stage 3 unspecified: Secondary | ICD-10-CM

## 2019-02-23 DIAGNOSIS — R600 Localized edema: Secondary | ICD-10-CM | POA: Diagnosis not present

## 2019-02-23 DIAGNOSIS — I10 Essential (primary) hypertension: Secondary | ICD-10-CM | POA: Diagnosis not present

## 2019-02-23 DIAGNOSIS — E782 Mixed hyperlipidemia: Secondary | ICD-10-CM | POA: Diagnosis not present

## 2019-02-23 DIAGNOSIS — I700 Atherosclerosis of aorta: Secondary | ICD-10-CM | POA: Diagnosis not present

## 2019-02-23 NOTE — Patient Instructions (Signed)
Add lipids and LFTs to labs at [place] to [facility]   Medication Instructions:  No changes  If you need a refill on your cardiac medications before your next appointment, please call your pharmacy.    Lab work: No new labs needed   If you have labs (blood work) drawn today and your tests are completely normal, you will receive your results only by: Marland Kitchen MyChart Message (if you have MyChart) OR . A paper copy in the mail If you have any lab test that is abnormal or we need to change your treatment, we will call you to review the results.   Testing/Procedures: No new testing needed   Follow-Up: At [facility], you and your health needs are our priority.  As part of our continuing mission to provide you with exceptional heart care, we have created designated Provider Care Teams.  These Care Teams include your primary Cardiologist (physician) and Advanced Practice Providers (APPs -  Physician Assistants and Nurse Practitioners) who all work together to provide you with the care you need, when you need it.  . You will need a follow up appointment in 12 months .   Please call our office 2 months in advance to schedule this appointment.    . Providers on your designated Care Team:   . Murray Hodgkins, NP . Christell Faith, [name] . Marrianne Mood, [name]  Any Other Special Instructions Will Be Listed Below (If Applicable).  For educational health videos Log in to : [email or web address] Or : [email or web address], password : [email or web address]

## 2019-02-24 ENCOUNTER — Telehealth: Payer: Medicare Other | Admitting: Cardiovascular Disease

## 2019-02-24 ENCOUNTER — Other Ambulatory Visit: Payer: Self-pay | Admitting: Family Medicine

## 2019-02-24 DIAGNOSIS — I10 Essential (primary) hypertension: Secondary | ICD-10-CM

## 2019-02-26 DIAGNOSIS — M47816 Spondylosis without myelopathy or radiculopathy, lumbar region: Secondary | ICD-10-CM | POA: Diagnosis not present

## 2019-03-05 ENCOUNTER — Telehealth: Payer: Self-pay | Admitting: *Deleted

## 2019-03-05 NOTE — Telephone Encounter (Signed)
Called patient in regards to an appointment she had scheduled with Dr.Byrnett on 03/16/19, I talked with patient letting her know that Dr.Byrnett is no longer here that we can refer her to [place] for continuing care for her colonoscopies or she can contact her PCP to see what her options are, patient requested that we send a referral to [facility].   Referral is done and sent to  GI, any provider

## 2019-03-08 ENCOUNTER — Other Ambulatory Visit: Payer: Self-pay

## 2019-03-08 ENCOUNTER — Encounter: Payer: Self-pay | Admitting: General Surgery

## 2019-03-08 ENCOUNTER — Telehealth: Payer: Self-pay

## 2019-03-08 DIAGNOSIS — Z86010 Personal history of colonic polyps: Secondary | ICD-10-CM

## 2019-03-08 DIAGNOSIS — Z800 Family history of malignant neoplasm of digestive organs: Secondary | ICD-10-CM

## 2019-03-08 MED ORDER — NA SULFATE-K SULFATE-MG SULF 17.5-3.13-1.6 GM/177ML PO SOLN: 1.0000 | Freq: Once | ORAL | 0 refills | Status: AC

## 2019-03-08 NOTE — Telephone Encounter (Signed)
Gastroenterology Pre-Procedure Review  Request Date: 03/15/19 Requesting Physician: Dr. Marius Ditch  PATIENT REVIEW QUESTIONS: The patient responded to the following health history questions as indicated:    1. Are you having any GI issues? no 2. Do you have a personal history of Polyps? yes (colon polyps 5 years ago) 3. Do you have a family history of Colon Cancer or Polyps? yes (sister colon cancer) 4. Diabetes Mellitus? no 5. Joint replacements in the past 12 months?no 6. Major health problems in the past 3 months?no 7. Any artificial heart valves, MVP, or defibrillator?no    MEDICATIONS & ALLERGIES:    Patient reports the following regarding taking any anticoagulation/antiplatelet therapy:   Plavix, Coumadin, Eliquis, Xarelto, Lovenox, Pradaxa, Brilinta, or Effient? no Aspirin? no  Patient confirms/reports the following medications:  Current Outpatient Medications  Medication Sig Dispense Refill  . aspirin 81 MG EC tablet TAKE 1 TABLET BY MOUTH EVERY DAY 30 tablet 0  . atorvastatin (LIPITOR) 40 MG tablet TAKE 1 TABLET BY MOUTH EVERY DAY 90 tablet 1  . Azelastine HCl 137 MCG/SPRAY SOLN Place 2 sprays into the nose daily. 2 sprays in each nostril daily 1 Bottle 5  . beclomethasone (QVAR) 80 MCG/ACT inhaler Inhale 2 puffs into the lungs 2 (two) times daily. Rinse mouth after use. 1 Inhaler 6  . carbamazepine (TEGRETOL) 200 MG tablet TAKE 1 TABLET (200 MG TOTAL) BY MOUTH 3 (THREE) TIMES DAILY. 90 tablet 0  . Cholecalciferol (VITAMIN D3 ADULT GUMMIES PO) Take 2 each by mouth daily.     . ferrous sulfate (IRON SUPPLEMENT) 325 (65 FE) MG tablet Take 325 mg by mouth daily with breakfast.    . fluticasone (FLONASE) 50 MCG/ACT nasal spray SPRAY 2 SPRAYS INTO EACH NOSTRIL EVERY DAY 16 g 3  . loratadine (CLARITIN) 10 MG tablet Take 1 tablet (10 mg total) by mouth daily. 90 tablet 0  . losartan (COZAAR) 25 MG tablet TAKE 1 TABLET BY MOUTH EVERY DAY 90 tablet 0  . magnesium oxide (MAG-OX) 400 MG  tablet Take 400 mg by mouth daily. Reported on 12/08/2015    . Multiple Vitamins-Minerals (CENTRUM SILVER) tablet Take 1 tablet by mouth daily. 30 tablet 0  . Na Sulfate-K Sulfate-Mg Sulf 17.5-3.13-1.6 GM/177ML SOLN Take 1 kit by mouth once for 1 dose. 354 mL 0  . omeprazole (PRILOSEC) 20 MG capsule Take 1 capsule (20 mg total) by mouth daily. 30 capsule 11  . QVAR REDIHALER 80 MCG/ACT inhaler TAKE 1 PUFF BY MOUTH TWICE A DAY 10.6 g 2  . ranitidine (ZANTAC) 150 MG tablet TAKE 1 TABLET (150 MG TOTAL) BY MOUTH DAILY AS NEEDED FOR HEARTBURN. 90 tablet 0  . traMADol (ULTRAM) 50 MG tablet Take 1 tablet (50 mg total) by mouth every 8 (eight) hours as needed. 90 tablet 0   No current facility-administered medications for this visit.     Patient confirms/reports the following allergies:  Allergies  Allergen Reactions  . Contrast Media [Iodinated Diagnostic Agents]   . Shellfish Allergy     Edema    No orders of the defined types were placed in this encounter.   AUTHORIZATION INFORMATION Primary Insurance: 1D#: Group #:  Secondary Insurance: 1D#: Group #:  SCHEDULE INFORMATION: Date: 03/15/19 Time: Location:[facility]

## 2019-03-10 ENCOUNTER — Encounter
Admission: RE | Admit: 2019-03-10 | Discharge: 2019-03-10 | Disposition: A | Discharge status: Home / Self Care | Payer: Medicare Other | Source: Ambulatory Visit | Attending: Gastroenterology | Admitting: Gastroenterology

## 2019-03-10 ENCOUNTER — Other Ambulatory Visit: Payer: Self-pay

## 2019-03-10 DIAGNOSIS — Z1159 Encounter for screening for other viral diseases: Secondary | ICD-10-CM | POA: Diagnosis not present

## 2019-03-10 LAB — SARS CORONAVIRUS 2 (TAT 6-24 HRS): SARS Coronavirus 2: NEGATIVE

## 2019-03-12 DIAGNOSIS — M47816 Spondylosis without myelopathy or radiculopathy, lumbar region: Secondary | ICD-10-CM | POA: Diagnosis not present

## 2019-03-15 ENCOUNTER — Other Ambulatory Visit: Payer: Self-pay

## 2019-03-15 ENCOUNTER — Ambulatory Visit: Payer: Medicare Other | Admitting: Registered Nurse

## 2019-03-15 ENCOUNTER — Encounter
Admission: RE | Disposition: A | Discharge status: Home / Self Care | Payer: Self-pay | Source: Home / Self Care | Attending: Gastroenterology

## 2019-03-15 ENCOUNTER — Encounter: Payer: Self-pay | Admitting: Anesthesiology

## 2019-03-15 ENCOUNTER — Ambulatory Visit
Admission: RE | Admit: 2019-03-15 | Discharge: 2019-03-15 | Disposition: A | Discharge status: Home / Self Care | Payer: Medicare Other | Attending: Gastroenterology | Admitting: Gastroenterology

## 2019-03-15 DIAGNOSIS — I10 Essential (primary) hypertension: Secondary | ICD-10-CM | POA: Diagnosis not present

## 2019-03-15 DIAGNOSIS — K5730 Diverticulosis of large intestine without perforation or abscess without bleeding: Secondary | ICD-10-CM | POA: Insufficient documentation

## 2019-03-15 DIAGNOSIS — Z800 Family history of malignant neoplasm of digestive organs: Secondary | ICD-10-CM | POA: Diagnosis not present

## 2019-03-15 DIAGNOSIS — D509 Iron deficiency anemia, unspecified: Secondary | ICD-10-CM | POA: Insufficient documentation

## 2019-03-15 DIAGNOSIS — G4733 Obstructive sleep apnea (adult) (pediatric): Secondary | ICD-10-CM | POA: Insufficient documentation

## 2019-03-15 DIAGNOSIS — K219 Gastro-esophageal reflux disease without esophagitis: Secondary | ICD-10-CM | POA: Insufficient documentation

## 2019-03-15 DIAGNOSIS — D573 Sickle-cell trait: Secondary | ICD-10-CM | POA: Diagnosis not present

## 2019-03-15 DIAGNOSIS — D122 Benign neoplasm of ascending colon: Secondary | ICD-10-CM | POA: Diagnosis not present

## 2019-03-15 DIAGNOSIS — Z7982 Long term (current) use of aspirin: Secondary | ICD-10-CM | POA: Insufficient documentation

## 2019-03-15 DIAGNOSIS — Z86010 Personal history of colonic polyps: Secondary | ICD-10-CM | POA: Insufficient documentation

## 2019-03-15 DIAGNOSIS — I129 Hypertensive chronic kidney disease with stage 1 through stage 4 chronic kidney disease, or unspecified chronic kidney disease: Secondary | ICD-10-CM | POA: Diagnosis not present

## 2019-03-15 DIAGNOSIS — Z9884 Bariatric surgery status: Secondary | ICD-10-CM | POA: Diagnosis not present

## 2019-03-15 DIAGNOSIS — Z79899 Other long term (current) drug therapy: Secondary | ICD-10-CM | POA: Insufficient documentation

## 2019-03-15 DIAGNOSIS — D120 Benign neoplasm of cecum: Secondary | ICD-10-CM | POA: Diagnosis not present

## 2019-03-15 DIAGNOSIS — K5790 Diverticulosis of intestine, part unspecified, without perforation or abscess without bleeding: Secondary | ICD-10-CM | POA: Diagnosis not present

## 2019-03-15 DIAGNOSIS — K635 Polyp of colon: Secondary | ICD-10-CM | POA: Insufficient documentation

## 2019-03-15 DIAGNOSIS — Z7951 Long term (current) use of inhaled steroids: Secondary | ICD-10-CM | POA: Insufficient documentation

## 2019-03-15 DIAGNOSIS — K644 Residual hemorrhoidal skin tags: Secondary | ICD-10-CM | POA: Diagnosis not present

## 2019-03-15 DIAGNOSIS — N183 Chronic kidney disease, stage 3 (moderate): Secondary | ICD-10-CM | POA: Diagnosis not present

## 2019-03-15 DIAGNOSIS — Z1211 Encounter for screening for malignant neoplasm of colon: Secondary | ICD-10-CM | POA: Diagnosis not present

## 2019-03-15 HISTORY — PX: COLONOSCOPY WITH PROPOFOL: SHX5780

## 2019-03-15 SURGERY — COLONOSCOPY WITH PROPOFOL
Anesthesia: General

## 2019-03-15 MED ORDER — SODIUM CHLORIDE 0.9 % IV SOLN
INTRAVENOUS | Status: DC
  Administered 2019-03-15: 08:00:00 via INTRAVENOUS

## 2019-03-15 MED ORDER — PROPOFOL 10 MG/ML IV BOLUS
INTRAVENOUS | Status: DC | PRN
  Administered 2019-03-15: 70 mg via INTRAVENOUS

## 2019-03-15 MED ORDER — LIDOCAINE HCL (CARDIAC) PF 100 MG/5ML IV SOSY
PREFILLED_SYRINGE | INTRAVENOUS | Status: DC | PRN
  Administered 2019-03-15: 60 mg via INTRAVENOUS

## 2019-03-15 MED ORDER — PROPOFOL 500 MG/50ML IV EMUL
INTRAVENOUS | Status: DC | PRN
  Administered 2019-03-15: 150 ug/kg/min via INTRAVENOUS

## 2019-03-15 NOTE — Anesthesia Post-op Follow-up Note (Signed)
Anesthesia QCDR form completed.        

## 2019-03-15 NOTE — Anesthesia Preprocedure Evaluation (Signed)
Anesthesia Evaluation  Patient identified by MRN, date of birth, ID band Patient awake    Reviewed: Allergy & Precautions, H&P , NPO status , Patient's Chart, lab work & pertinent test results  History of Anesthesia Complications Negative for: history of anesthetic complications  Airway Mallampati: II  TM Distance: >3 FB Neck ROM: full    Dental  (+) Chipped   Pulmonary neg shortness of breath, sleep apnea and Continuous Positive Airway Pressure Ventilation ,           Cardiovascular Exercise Tolerance: Good hypertension, (-) angina(-) Past MI and (-) DOE      Neuro/Psych PSYCHIATRIC DISORDERS  Neuromuscular disease    GI/Hepatic Neg liver ROS, GERD  Medicated and Controlled,  Endo/Other  negative endocrine ROS  Renal/GU CRFRenal disease  negative genitourinary   Musculoskeletal  (+) Arthritis ,   Abdominal   Peds  Hematology negative hematology ROS (+)   Anesthesia Other Findings Past Medical History: No date: Bruising No date: Cervical radiculopathy No date: Deficiency of vitamin B No date: Depression No date: Edema No date: Flank pain No date: Gross hematuria No date: HTN (hypertension) No date: Iron deficiency anemia No date: Leukopenia No date: OA (osteoarthritis) No date: Obesity No date: OSA on CPAP No date: Osteopenia No date: Perennial allergic rhinitis No date: Renal cysts, acquired, bilateral No date: RLS (restless legs syndrome) No date: Sickle cell trait (HCC) No date: Snoring No date: Spinal stenosis  Past Surgical History: No date: BARIATRIC SURGERY No date: CESAREAN SECTION     Comment:  3 or more No date: GASTRIC BYPASS No date: TUBAL LIGATION     Reproductive/Obstetrics negative OB ROS                             Anesthesia Physical Anesthesia Plan  ASA: III  Anesthesia Plan: General   Post-op Pain Management:    Induction:  Intravenous  PONV Risk Score and Plan: Propofol infusion and TIVA  Airway Management Planned: Natural Airway and Nasal Cannula  Additional Equipment:   Intra-op Plan:   Post-operative Plan:   Informed Consent: I have reviewed the patients History and Physical, chart, labs and discussed the procedure including the risks, benefits and alternatives for the proposed anesthesia with the patient or authorized representative who has indicated his/her understanding and acceptance.     Dental Advisory Given  Plan Discussed with: Anesthesiologist, CRNA and Surgeon  Anesthesia Plan Comments: (Patient consented for risks of anesthesia including but not limited to:  - adverse reactions to medications - risk of intubation if required - damage to teeth, lips or other oral mucosa - sore throat or hoarseness - Damage to heart, brain, lungs or loss of life  Patient voiced understanding.)        Anesthesia Quick Evaluation

## 2019-03-15 NOTE — Op Note (Signed)
[facility] Gastroenterology Patient Name: Melissa [name] Procedure Date: 03/15/2019 8:29 AM MRN: [id number] Account #: [id number] Date of Birth: 1951-08-18 Admit Type: Outpatient Age: 68 Room: [facility] ENDO ROOM 3 Gender: Female Note Status: Finalized Procedure:            Colonoscopy Indications:          High risk colon cancer surveillance: Personal history                        of colonic polyps, Family history of colon cancer in a                        first-degree relative before age 63 years Providers:            Ariannah Arenson Raeanne Gathers MD, MD Referring MD:         Bethena Roys. Sowles, MD (Referring MD) Medicines:            Monitored Anesthesia Care Complications:        No immediate complications. Estimated blood loss: None. Procedure:            Pre-Anesthesia Assessment:                       - Prior to the procedure, a History and Physical was                        performed, and patient medications and allergies were                        reviewed. The patient is competent. The risks and                        benefits of the procedure and the sedation options and                        risks were discussed with the patient. All questions                        were answered and informed consent was obtained.                        Patient identification and proposed procedure were                        verified by the physician, the nurse, the                        anesthesiologist, the anesthetist and the technician in                        the pre-procedure area in the procedure room in the                        endoscopy suite. Mental Status Examination: alert and                        oriented. Airway Examination: normal oropharyngeal                        airway and neck mobility. Respiratory  Examination:                        clear to auscultation. CV Examination: normal.                        Prophylactic Antibiotics: The patient does not  require                        prophylactic antibiotics. Prior Anticoagulants: The                        patient has taken no previous anticoagulant or                        antiplatelet agents. ASA Grade Assessment: III - A                        patient with severe systemic disease. After reviewing                        the risks and benefits, the patient was deemed in                        satisfactory condition to undergo the procedure. The                        anesthesia plan was to use monitored anesthesia care                        (MAC). Immediately prior to administration of                        medications, the patient was re-assessed for adequacy                        to receive sedatives. The heart rate, respiratory rate,                        oxygen saturations, blood pressure, adequacy of                        pulmonary ventilation, and response to care were                        monitored throughout the procedure. The physical status                        of the patient was re-assessed after the procedure.                       After obtaining informed consent, the colonoscope was                        passed under direct vision. Throughout the procedure,                        the patient's blood pressure, pulse, and oxygen                        saturations were monitored continuously. The  Colonoscope was introduced through the anus and                        advanced to the the cecum, identified by appendiceal                        orifice and ileocecal valve. The colonoscopy was                        performed without difficulty. The patient tolerated the                        procedure well. The quality of the bowel preparation                        was good. Findings:      The perianal and digital rectal examinations were normal. Pertinent       negatives include normal sphincter tone and no palpable rectal lesions.      Six  sessile polyps were found in the transverse colon, ascending colon       and cecum. The polyps were 3 to 5 mm in size. These polyps were removed       with a cold snare. Resection and retrieval were complete.      Multiple diverticula were found in the sigmoid colon.      Non-bleeding external hemorrhoids were found during retroflexion. The       hemorrhoids were medium-sized. Impression:           - Six 3 to 5 mm polyps in the transverse colon, in the                        ascending colon and in the cecum, removed with a cold                        snare. Resected and retrieved.                       - Diverticulosis in the sigmoid colon.                       - Non-bleeding external hemorrhoids. Recommendation:       - Discharge patient to home (with escort).                       - Resume previous diet today.                       - Continue present medications.                       - Await pathology results.                       - Repeat colonoscopy in 3 years for surveillance of                        multiple polyps. Procedure Code(s):    --- Professional ---                       [id number], Colonoscopy, flexible; with removal of tumor(s),  polyp(s), or other lesion(s) by snare technique Diagnosis Code(s):    --- Professional ---                       Z86.010, Personal history of colonic polyps                       K63.5, Polyp of colon                       K64.4, Residual hemorrhoidal skin tags                       Z80.0, Family history of malignant neoplasm of                        digestive organs                       K57.30, Diverticulosis of large intestine without                        perforation or abscess without bleeding CPT copyright 2019 American Medical Association. All rights reserved. The codes documented in this report are preliminary and upon coder review may  be revised to meet current compliance requirements. Dr. Ulyess Mort [name] MD, MD 03/15/2019 8:57:32 AM This report has been signed electronically. Number of Addenda: 0 Note Initiated On: 03/15/2019 8:29 AM Scope Withdrawal Time: 0 hours 16 minutes 37 seconds  Total Procedure Duration: 0 hours 20 minutes 2 seconds  Estimated Blood Loss: Estimated blood loss: none.      [facility]

## 2019-03-15 NOTE — Anesthesia Postprocedure Evaluation (Signed)
Anesthesia Post Note  Patient: Melissa [name]  Procedure(s) Performed: COLONOSCOPY WITH PROPOFOL (N/A )  Patient location during evaluation: Endoscopy Anesthesia Type: General Level of consciousness: awake and alert Pain management: pain level controlled Vital Signs Assessment: post-procedure vital signs reviewed and stable Respiratory status: spontaneous breathing, nonlabored ventilation, respiratory function stable and patient connected to nasal cannula oxygen Cardiovascular status: blood pressure returned to baseline and stable Postop Assessment: no apparent nausea or vomiting Anesthetic complications: no     Last Vitals:  Vitals:   03/15/19 0908 03/15/19 0918  BP: 124/80 132/87  Pulse: 65 63  Resp: 16 16  Temp:    SpO2: 100% 100%    Last Pain:  Vitals:   03/15/19 0918  TempSrc:   PainSc: 0-No pain                 Precious Haws [place]

## 2019-03-15 NOTE — H&P (Signed)
Melissa Darby, MD [address]  [place]  [place], [place] [address]  Main: [phone]  Fax: [phone] Pager: [phone]  Primary Care Physician:  Steele Sizer, MD Primary Gastroenterologist:  Dr. Cephas [name]  Pre-Procedure History & Physical: HPI:  Melissa [name] is a 68 y.o. female is here for an colonoscopy.   Past Medical History:  Diagnosis Date  . Bruising   . Cervical radiculopathy   . Deficiency of vitamin B   . Depression   . Edema   . Flank pain   . Gross hematuria   . HTN (hypertension)   . Iron deficiency anemia   . Leukopenia   . OA (osteoarthritis)   . Obesity   . OSA on CPAP   . Osteopenia   . Perennial allergic rhinitis   . Renal cysts, acquired, bilateral   . RLS (restless legs syndrome)   . Sickle cell trait ([place])   . Snoring   . Spinal stenosis     Past Surgical History:  Procedure Laterality Date  . BARIATRIC SURGERY    . CESAREAN SECTION     3 or more  . GASTRIC BYPASS    . TUBAL LIGATION      Prior to Admission medications   Medication Sig Start Date End Date Taking? Authorizing Provider  aspirin 81 MG EC tablet TAKE 1 TABLET BY MOUTH EVERY DAY 08/27/18  Yes Sowles, Drue Stager, MD  atorvastatin (LIPITOR) 40 MG tablet TAKE 1 TABLET BY MOUTH EVERY DAY 01/24/19  Yes Sowles, Drue Stager, MD  Azelastine HCl 137 MCG/SPRAY SOLN Place 2 sprays into the nose daily. 2 sprays in each nostril daily 05/19/18  Yes Laverle Hobby, MD  beclomethasone (QVAR) 80 MCG/ACT inhaler Inhale 2 puffs into the lungs 2 (two) times daily. Rinse mouth after use. 04/28/18  Yes Laverle Hobby, MD  carbamazepine (TEGRETOL) 200 MG tablet TAKE 1 TABLET (200 MG TOTAL) BY MOUTH 3 (THREE) TIMES DAILY. 08/31/18  Yes Sowles, Drue Stager, MD  Cholecalciferol (VITAMIN D3 ADULT GUMMIES PO) Take 2 each by mouth daily.    Yes [provider]  ferrous sulfate (IRON SUPPLEMENT) 325 (65 FE) MG tablet Take 325 mg by mouth daily with breakfast.   Yes [provider]  fluticasone (FLONASE) 50 MCG/ACT nasal spray SPRAY 2 SPRAYS INTO EACH NOSTRIL EVERY DAY 06/11/18  Yes Laverle Hobby, MD  loratadine (CLARITIN) 10 MG tablet Take 1 tablet (10 mg total) by mouth daily. 10/02/18  Yes Sowles, Drue Stager, MD  losartan (COZAAR) 25 MG tablet TAKE 1 TABLET BY MOUTH EVERY DAY 02/24/19  Yes Sowles, Drue Stager, MD  magnesium oxide (MAG-OX) 400 MG tablet Take 400 mg by mouth daily. Reported on 12/08/2015   Yes [provider]  Multiple Vitamins-Minerals (CENTRUM SILVER) tablet Take 1 tablet by mouth daily. 07/02/18  Yes Sowles, Drue Stager, MD  omeprazole (PRILOSEC) 20 MG capsule Take 1 capsule (20 mg total) by mouth daily. 04/29/18  Yes Laverle Hobby, MD  QVAR REDIHALER 80 MCG/ACT inhaler TAKE 1 PUFF BY MOUTH TWICE A DAY 01/13/19  Yes Laverle Hobby, MD  ranitidine (ZANTAC) 150 MG tablet TAKE 1 TABLET (150 MG TOTAL) BY MOUTH DAILY AS NEEDED FOR HEARTBURN. 02/15/18  Yes Sowles, Drue Stager, MD  traMADol (ULTRAM) 50 MG tablet Take 1 tablet (50 mg total) by mouth every 8 (eight) hours as needed. 11/27/18  Yes Steele Sizer, MD    Allergies as of 03/08/2019 - Review Complete 02/23/2019  Allergen Reaction Noted  . Contrast media [iodinated diagnostic agents]  04/17/2015  . Shellfish allergy  04/17/2015    Family History  Problem Relation Age of Onset  . Hypercholesterolemia Mother   . Heart disease Mother   . Hypertension Mother   . Alcohol abuse Father   . Lung cancer Brother   . Alcohol abuse Brother   . Diabetes Mellitus II Sister   . Hypertension Maternal Grandmother   . Colon cancer Sister   . Breast cancer Neg Hx     Social History   Socioeconomic History  . Marital status: Married    Spouse name: Ronnell Makarewicz  . Number of children: 4  . Years of education: Not on file  . Highest education level: Some college, no degree  Occupational History  . Not on file  Social Needs  . Financial resource strain: Not hard at all  . Food  insecurity    Worry: Never true    Inability: Never true  . Transportation needs    Medical: No    Non-medical: No  Tobacco Use  . Smoking status: Never Smoker  . Smokeless tobacco: Never Used  Substance and Sexual Activity  . Alcohol use: Not Currently    Alcohol/week: 0.0 standard drinks    Comment: occasionally  . Drug use: No  . Sexual activity: Yes    Partners: Male    Birth control/protection: Post-menopausal  Lifestyle  . Physical activity    Days per week: 0 days    Minutes per session: 0 min  . Stress: Not at all  Relationships  . Social connections    Talks on phone: More than three times a week    Gets together: More than three times a week    Attends religious service: Never    Active member of club or organization: No    Attends meetings of clubs or organizations: Never    Relationship status: Married  . Intimate partner violence    Fear of current or ex partner: No    Emotionally abused: No    Physically abused: No    Forced sexual activity: No  Other Topics Concern  . Not on file  Social History Narrative  . Not on file    Review of Systems: See HPI, otherwise negative ROS  Physical Exam: BP 115/71 (BP Location: Left Arm)   Pulse 76   Temp (!) 97.5 F (36.4 C)   Resp (!) 6   Ht 6' (1.829 m)   Wt 104.3 kg   SpO2 99%   BMI 31.19 kg/m  General:   Alert,  pleasant and cooperative in NAD Head:  Normocephalic and atraumatic. Neck:  Supple; no masses or thyromegaly. Lungs:  Clear throughout to auscultation.    Heart:  Regular rate and rhythm. Abdomen:  Soft, nontender and nondistended. Normal bowel sounds, without guarding, and without rebound.   Neurologic:  Alert and  oriented x4;  grossly normal neurologically.  Impression/Plan: Melissa [name] is here for an colonoscopy to be performed for h/o tubular adenoma, family history of colon cancer 1st degree relative  Risks, benefits, limitations, and alternatives regarding  colonoscopy have  been reviewed with the patient.  Questions have been answered.  All parties agreeable.   Sherri Sear, MD  03/15/2019, 9:02 AM

## 2019-03-15 NOTE — Transfer of Care (Signed)
Immediate Anesthesia Transfer of Care Note  Patient: Melissa [name]  Procedure(s) Performed: COLONOSCOPY WITH PROPOFOL (N/A )  Patient Location: PACU  Anesthesia Type:General  Level of Consciousness: sedated  Airway & Oxygen Therapy: Patient Spontanous Breathing and Patient connected to nasal cannula oxygen  Post-op Assessment: Report given to RN and Post -op Vital signs reviewed and stable  Post vital signs: Reviewed and stable  Last Vitals:  Vitals Value Taken Time  BP 115/71 03/15/19 0859  Temp 36.4 C 03/15/19 0859  Pulse 77 03/15/19 0859  Resp 19 03/15/19 0859  SpO2 100 % 03/15/19 0859  Vitals shown include unvalidated device data.  Last Pain:  Vitals:   03/15/19 0858  TempSrc: Tympanic  PainSc: 0-No pain         Complications: No apparent anesthesia complications

## 2019-03-16 ENCOUNTER — Ambulatory Visit: Payer: Federal, State, Local not specified - PPO | Admitting: General Surgery

## 2019-03-16 ENCOUNTER — Encounter: Payer: Self-pay | Admitting: Gastroenterology

## 2019-03-16 LAB — SURGICAL PATHOLOGY

## 2019-03-18 ENCOUNTER — Other Ambulatory Visit: Payer: Self-pay

## 2019-03-18 ENCOUNTER — Ambulatory Visit (INDEPENDENT_AMBULATORY_CARE_PROVIDER_SITE_OTHER): Payer: Medicare Other | Admitting: Family Medicine

## 2019-03-18 ENCOUNTER — Encounter: Payer: Self-pay | Admitting: Family Medicine

## 2019-03-18 DIAGNOSIS — M25471 Effusion, right ankle: Secondary | ICD-10-CM | POA: Diagnosis not present

## 2019-03-18 DIAGNOSIS — M25472 Effusion, left ankle: Secondary | ICD-10-CM

## 2019-03-18 DIAGNOSIS — I10 Essential (primary) hypertension: Secondary | ICD-10-CM | POA: Diagnosis not present

## 2019-03-18 NOTE — Progress Notes (Signed)
Name: Melissa [name]   MRN: [id number]    DOB: 1950/10/04   Date:03/18/2019       Progress Note  Subjective  Chief Complaint  Chief Complaint  Patient presents with  . Edema    ankles swelling left worst than right    I connected with  [name]  on 03/18/19 at 12:40 PM EDT by a video enabled telemedicine application and verified that I am speaking with the correct person using two identifiers.  I discussed the limitations of evaluation and management by telemedicine and the availability of in person appointments. The patient expressed understanding and agreed to proceed. Staff also discussed with the patient that there may be a patient responsible charge related to this service. Patient Location: Home Provider Location: Office Additional Individuals present: None  HPI  Pt presents with concern for bilateral ankle swelling L>R for months now.  She is having swelling daily, does not go down at night, feels like the legs are full of fluid.  Did have pain in the LEFT left one of the days.  She was told to wear compression stockings and to elevated the legs by her PCP at her most recent visit. She did see her cardiologist - Dr. Rockey Situ - who did an echo 02/11/2019 in response to LE edema, and he notes normal chamber pressures.  She does have anemia; is no longer taking HCTZ due to climbing creatinine - is followed by Dr. Abigail Butts.  Dr. Rockey Situ did suggest venous insufficiency.  - She has some compression stockings from the dollar store and she does not wear them daily. - She denies chest pain, shortness of breath, calf tenderness/redness.  Patient Active Problem List   Diagnosis Date Noted  . Family history of colon cancer   . Trigeminal neuralgia 09/11/2018  . Cardiac LV ejection fraction of 40-49% 05/07/2017  . Low back pain without sciatica 02/21/2017  . Renal cyst 10/18/2016  . Atherosclerosis of aorta ([place]) 10/16/2016  . History of colonic polyps 09/25/2016  . Dyslipidemia  12/08/2015  . Gastroesophageal reflux disease without esophagitis 08/11/2015  . Chronic kidney disease (CKD), stage III (moderate) ([place]) 07/02/2015  . Primary osteoarthritis of knee 06/13/2015  . Hip bursitis 06/13/2015  . RLS (restless legs syndrome) 06/13/2015  . Depression, major, in remission ([place]) 06/13/2015  . B12 deficiency 06/13/2015  . History of Roux-en-Y gastric bypass 06/13/2015  . History of iron deficiency anemia 06/13/2015  . Vitamin D deficiency 06/13/2015  . Hypertension, benign 06/13/2015  . Perennial allergic rhinitis with seasonal variation 06/13/2015  . Obesity (BMI 30.0-34.9) 06/13/2015  . Cervical radiculitis 01/04/2014  . Cervical spinal stenosis 01/04/2014  . Cervical osteoarthritis 01/04/2014    Social History   Tobacco Use  . Smoking status: Never Smoker  . Smokeless tobacco: Never Used  Substance Use Topics  . Alcohol use: Not Currently    Alcohol/week: 0.0 standard drinks    Comment: occasionally     Current Outpatient Medications:  .  aspirin 81 MG EC tablet, TAKE 1 TABLET BY MOUTH EVERY DAY, Disp: 30 tablet, Rfl: 0 .  atorvastatin (LIPITOR) 40 MG tablet, TAKE 1 TABLET BY MOUTH EVERY DAY, Disp: 90 tablet, Rfl: 1 .  Azelastine HCl 137 MCG/SPRAY SOLN, Place 2 sprays into the nose daily. 2 sprays in each nostril daily, Disp: 1 Bottle, Rfl: 5 .  beclomethasone (QVAR) 80 MCG/ACT inhaler, Inhale 2 puffs into the lungs 2 (two) times daily. Rinse mouth after use., Disp: 1 Inhaler, Rfl: 6 .  carbamazepine (TEGRETOL) 200 MG tablet, TAKE 1 TABLET (200 MG TOTAL) BY MOUTH 3 (THREE) TIMES DAILY., Disp: 90 tablet, Rfl: 0 .  Cholecalciferol (VITAMIN D3 ADULT GUMMIES PO), Take 2 each by mouth daily. , Disp: , Rfl:  .  ferrous sulfate (IRON SUPPLEMENT) 325 (65 FE) MG tablet, Take 325 mg by mouth daily with breakfast., Disp: , Rfl:  .  fluticasone (FLONASE) 50 MCG/ACT nasal spray, SPRAY 2 SPRAYS INTO EACH NOSTRIL EVERY DAY, Disp: 16 g, Rfl: 3 .  loratadine  (CLARITIN) 10 MG tablet, Take 1 tablet (10 mg total) by mouth daily., Disp: 90 tablet, Rfl: 0 .  losartan (COZAAR) 25 MG tablet, TAKE 1 TABLET BY MOUTH EVERY DAY, Disp: 90 tablet, Rfl: 0 .  magnesium oxide (MAG-OX) 400 MG tablet, Take 400 mg by mouth daily. Reported on 12/08/2015, Disp: , Rfl:  .  omeprazole (PRILOSEC) 20 MG capsule, Take 1 capsule (20 mg total) by mouth daily., Disp: 30 capsule, Rfl: 11 .  QVAR REDIHALER 80 MCG/ACT inhaler, TAKE 1 PUFF BY MOUTH TWICE A DAY, Disp: 10.6 g, Rfl: 2 .  Multiple Vitamins-Minerals (CENTRUM SILVER) tablet, Take 1 tablet by mouth daily., Disp: 30 tablet, Rfl: 0 .  traMADol (ULTRAM) 50 MG tablet, Take 1 tablet (50 mg total) by mouth every 8 (eight) hours as needed., Disp: 90 tablet, Rfl: 0  Allergies  Allergen Reactions  . Contrast Media [Iodinated Diagnostic Agents]   . Shellfish Allergy     Edema    I personally reviewed active problem list, medication list, allergies, notes from last encounter, lab results with the patient/caregiver today.  ROS  Ten systems reviewed and is negative except as mentioned in HPI   Objective  Virtual encounter, vitals not obtained.  There is no height or weight on file to calculate BMI.  Nursing Note and Vital Signs reviewed.  Physical Exam  Constitutional: Patient appears well-developed and well-nourished. No distress.  HENT: Head: Normocephalic and atraumatic.  Neck: Normal range of motion. Pulmonary/Chest: Effort normal. No respiratory distress. Speaking in complete sentences Neurological: Pt is alert and oriented to person, place, and time. Coordination, speech and gait are normal.  Psychiatric: Patient has a normal mood and affect. behavior is normal. Judgment and thought content normal. Skin: Unfortunately, the video quality/connected was too poor to provide adequate assessment of the edema present.  No results found for this or any previous visit (from the past 72 hour(s)).  Assessment & Plan   1. Hypertension, benign - Ambulatory referral to Vascular Surgery  2. Ankle edema, bilateral - Reviewed in detail with the patient - has seen cardiology, and has spoken with Dr. Ancil Boozer her PCP.  She has not been compliant with compression stockings which I do recommend.  She is offered referral to vascular for confirmation of venous insufficiency vs other etiology and she would like to go this route prior to Rx'd stockings. - Ambulatory referral to Vascular Surgery   -Red flags and when to present for emergency care or RTC including fever >101.29F, chest pain, shortness of breath, new/worsening/un-resolving symptoms, reviewed with patient at time of visit. Follow up and care instructions discussed and provided in AVS. - I discussed the assessment and treatment plan with the patient. The patient was provided an opportunity to ask questions and all were answered. The patient agreed with the plan and demonstrated an understanding of the instructions.  I provided 16 minutes of non-face-to-face time during this encounter.  Hubbard Hartshorn, FNP

## 2019-03-22 DIAGNOSIS — M5136 Other intervertebral disc degeneration, lumbar region: Secondary | ICD-10-CM | POA: Diagnosis not present

## 2019-03-22 DIAGNOSIS — M48062 Spinal stenosis, lumbar region with neurogenic claudication: Secondary | ICD-10-CM | POA: Diagnosis not present

## 2019-03-22 DIAGNOSIS — M47816 Spondylosis without myelopathy or radiculopathy, lumbar region: Secondary | ICD-10-CM | POA: Diagnosis not present

## 2019-03-22 DIAGNOSIS — M5416 Radiculopathy, lumbar region: Secondary | ICD-10-CM | POA: Diagnosis not present

## 2019-03-30 ENCOUNTER — Other Ambulatory Visit: Payer: Self-pay

## 2019-03-30 ENCOUNTER — Ambulatory Visit (INDEPENDENT_AMBULATORY_CARE_PROVIDER_SITE_OTHER): Payer: Medicare Other | Admitting: Vascular Surgery

## 2019-03-30 ENCOUNTER — Encounter (INDEPENDENT_AMBULATORY_CARE_PROVIDER_SITE_OTHER): Payer: Self-pay | Admitting: Vascular Surgery

## 2019-03-30 ENCOUNTER — Ambulatory Visit: Payer: Medicare Other

## 2019-03-30 VITALS — BP 166/84 | HR 70 | Resp 12 | Ht 72.0 in | Wt 230.0 lb

## 2019-03-30 DIAGNOSIS — N183 Chronic kidney disease, stage 3 unspecified: Secondary | ICD-10-CM

## 2019-03-30 DIAGNOSIS — I10 Essential (primary) hypertension: Secondary | ICD-10-CM | POA: Diagnosis not present

## 2019-03-30 DIAGNOSIS — M7989 Other specified soft tissue disorders: Secondary | ICD-10-CM | POA: Insufficient documentation

## 2019-03-30 NOTE — Patient Instructions (Signed)

## 2019-03-30 NOTE — Progress Notes (Signed)
Patient ID: Melissa [name], female   DOB: [birth year], 68 y.o.   MRN: [id number]  Chief Complaint  Patient presents with  . New Patient (Initial Visit)    HPI Melissa [name] is a 68 y.o. female.  I am asked to see the patient by E. Boyce NP/Dr. Ancil Boozer for evaluation of leg swelling.  Patient began noticing leg swelling a little over a month ago.  This is predominantly in the left leg.  There is no clear inciting event or causative factor that started the symptoms.  She has started wearing compression stockings at least 8 hours a day and has not seen significant improvement.  She has also been elevating her leg more without significant improvement.  No previous history of DVT or superficial thrombophlebitis to her knowledge.  She reports that she saw a cardiologist and her heart checked out okay.  No history of renal disease to her knowledge.     Past Medical History:  Diagnosis Date  . Bruising   . Cervical radiculopathy   . Deficiency of vitamin B   . Depression   . Edema   . Flank pain   . Gross hematuria   . HTN (hypertension)   . Iron deficiency anemia   . Leukopenia   . OA (osteoarthritis)   . Obesity   . OSA on CPAP   . Osteopenia   . Perennial allergic rhinitis   . Renal cysts, acquired, bilateral   . RLS (restless legs syndrome)   . Sickle cell trait ([place])   . Snoring   . Spinal stenosis     Past Surgical History:  Procedure Laterality Date  . BARIATRIC SURGERY    . CESAREAN SECTION     3 or more  . COLONOSCOPY WITH PROPOFOL N/A 03/15/2019   Procedure: COLONOSCOPY WITH PROPOFOL;  Surgeon: [name], MD;  Location: [facility] ENDOSCOPY;  Service: Gastroenterology;  Laterality: N/A;  . GASTRIC BYPASS    . TUBAL LIGATION      Family History Family History  Problem Relation Age of Onset  . Hypercholesterolemia Mother   . Heart disease Mother   . Hypertension Mother   . Alcohol abuse Father   . Lung cancer Brother   . Alcohol abuse Brother   .  Diabetes Mellitus II Sister   . Hypertension Maternal Grandmother   . Colon cancer Sister   . Breast cancer Neg Hx     Social History Social History   Tobacco Use  . Smoking status: Never Smoker  . Smokeless tobacco: Never Used  Substance Use Topics  . Alcohol use: Not Currently    Alcohol/week: 0.0 standard drinks    Comment: occasionally  . Drug use: No    Allergies  Allergen Reactions  . Contrast Media [Iodinated Diagnostic Agents]   . Shellfish Allergy     Edema    Current Outpatient Medications  Medication Sig Dispense Refill  . aspirin 81 MG EC tablet TAKE 1 TABLET BY MOUTH EVERY DAY 30 tablet 0  . atorvastatin (LIPITOR) 40 MG tablet TAKE 1 TABLET BY MOUTH EVERY DAY 90 tablet 1  . Azelastine HCl 137 MCG/SPRAY SOLN Place 2 sprays into the nose daily. 2 sprays in each nostril daily 1 Bottle 5  . beclomethasone (QVAR) 80 MCG/ACT inhaler Inhale 2 puffs into the lungs 2 (two) times daily. Rinse mouth after use. 1 Inhaler 6  . Cholecalciferol (VITAMIN D3 ADULT GUMMIES PO) Take 2 each by mouth daily.     Marland Kitchen  ferrous sulfate (IRON SUPPLEMENT) 325 (65 FE) MG tablet Take 325 mg by mouth daily with breakfast.    . fluticasone (FLONASE) 50 MCG/ACT nasal spray SPRAY 2 SPRAYS INTO EACH NOSTRIL EVERY DAY 16 g 3  . loratadine (CLARITIN) 10 MG tablet Take 1 tablet (10 mg total) by mouth daily. 90 tablet 0  . losartan (COZAAR) 25 MG tablet TAKE 1 TABLET BY MOUTH EVERY DAY 90 tablet 0  . magnesium oxide (MAG-OX) 400 MG tablet Take 400 mg by mouth daily. Reported on 12/08/2015    . Multiple Vitamins-Minerals (CENTRUM SILVER) tablet Take 1 tablet by mouth daily. 30 tablet 0  . omeprazole (PRILOSEC) 20 MG capsule Take 1 capsule (20 mg total) by mouth daily. 30 capsule 11  . traMADol (ULTRAM) 50 MG tablet Take 1 tablet (50 mg total) by mouth every 8 (eight) hours as needed. 90 tablet 0  . carbamazepine (TEGRETOL) 200 MG tablet TAKE 1 TABLET (200 MG TOTAL) BY MOUTH 3 (THREE) TIMES DAILY. (Patient  not taking: Reported on 03/30/2019) 90 tablet 0  . QVAR REDIHALER 80 MCG/ACT inhaler TAKE 1 PUFF BY MOUTH TWICE A DAY 10.6 g 2   No current facility-administered medications for this visit.       REVIEW OF SYSTEMS (Negative unless checked)  Constitutional: [] Weight loss  [] Fever  [] Chills Cardiac: [] Chest pain   [] Chest pressure   [] Palpitations   [] Shortness of breath when laying flat   [] Shortness of breath at rest   [] Shortness of breath with exertion. Vascular:  [] Pain in legs with walking   [] Pain in legs at rest   [] Pain in legs when laying flat   [] Claudication   [] Pain in feet when walking  [] Pain in feet at rest  [] Pain in feet when laying flat   [] History of DVT   [] Phlebitis   [x] Swelling in legs   [] Varicose veins   [] Non-healing ulcers Pulmonary:   [] Uses home oxygen   [] Productive cough   [] Hemoptysis   [] Wheeze  [] COPD   [] Asthma Neurologic:  [] Dizziness  [] Blackouts   [] Seizures   [] History of stroke   [] History of TIA  [] Aphasia   [] Temporary blindness   [] Dysphagia   [] Weakness or numbness in arms   [] Weakness or numbness in legs Musculoskeletal:  [] Arthritis   [] Joint swelling   [] Joint pain   [x] Low back pain Hematologic:  [] Easy bruising  [] Easy bleeding   [] Hypercoagulable state   [x] Anemic  [] Hepatitis Gastrointestinal:  [] Blood in stool   [] Vomiting blood  [] Gastroesophageal reflux/heartburn   [] Abdominal pain Genitourinary:  [] Chronic kidney disease   [] Difficult urination  [] Frequent urination  [] Burning with urination   [] Hematuria Skin:  [] Rashes   [] Ulcers   [] Wounds Psychological:  [] History of anxiety   [x]  History of major depression.    Physical Exam BP (!) 166/84 (BP Location: Left Arm, Patient Position: Sitting, Cuff Size: Normal)   Pulse 70   Resp 12   Ht 6' (1.829 m)   Wt 230 lb (104.3 kg)   BMI 31.19 kg/m  Gen:  WD/WN, NAD. Appears younger than stated age. Head: [place]/AT, No temporalis wasting.  Ear/Nose/Throat: Hearing grossly intact, nares w/o  erythema or drainage, oropharynx w/o Erythema/Exudate Eyes: Conjunctiva clear, sclera non-icteric  Neck: trachea midline.  No JVD.  Pulmonary:  Good air movement, respirations not labored, no use of accessory muscles  Cardiac: RRR, no JVD Vascular:  Vessel Right Left  Radial Palpable Palpable  DP 2+ Trace   PT 1+ 2+   Gastrointestinal:. No masses, surgical incisions, or scars. Musculoskeletal: M/S 5/5 throughout.  Extremities without ischemic changes.  No deformity or atrophy.  1+ left lower extremity edema. Neurologic: Sensation grossly intact in extremities.  Symmetrical.  Speech is fluent. Motor exam as listed above. Psychiatric: Judgment intact, Mood & affect appropriate for pt's clinical situation. Dermatologic: No rashes or ulcers noted.  No cellulitis or open wounds.    Radiology No results found.  Labs Recent Results (from the past 2160 hour(s))  Basic metabolic panel     Status: Abnormal   Collection Time: 01/22/19  3:21 PM  Result Value Ref Range   Sodium 139 135 - 145 mmol/L   Potassium 4.7 3.5 - 5.1 mmol/L   Chloride 104 98 - 111 mmol/L   CO2 26 22 - 32 mmol/L   Glucose, Bld 144 (H) 70 - 99 mg/dL   BUN 35 (H) 8 - 23 mg/dL   Creatinine, Ser 1.64 (H) 0.44 - 1.00 mg/dL   Calcium 9.0 8.9 - 10.3 mg/dL   GFR calc non Af Amer 32 (L) >60 mL/min   GFR calc Af Amer 38 (L) >60 mL/min   Anion gap 9 5 - 15    Comment: Performed at [facility], [address] [place], [place] [address]  SARS Coronavirus 2 (Performed in [facility] hospital lab)     Status: None   Collection Time: 03/10/19 11:58 AM   Specimen: Nasal Swab  Result Value Ref Range   SARS Coronavirus 2 NEGATIVE NEGATIVE    Comment: (NOTE) SARS-CoV-2 target nucleic acids are NOT DETECTED. The SARS-CoV-2 RNA is generally detectable in upper and lower respiratory specimens during the acute phase of infection. Negative results do not preclude SARS-CoV-2 infection, do not  rule out co-infections with other pathogens, and should not be used as the sole basis for treatment or other patient management decisions. Negative results must be combined with clinical observations, patient history, and epidemiological information. The expected result is Negative. Fact Sheet for Patients: SugarRoll.be Fact Sheet for Healthcare Providers: https://www.woods-mathews.com/ This test is not yet approved or cleared by the [place] FDA and  has been authorized for detection and/or diagnosis of SARS-CoV-2 by FDA under an Emergency Use Authorization (EUA). This EUA will remain  in effect (meaning this test can be used) for the duration of the COVID-19 declaration under Section 56 4(b)(1) of the Act, 21 U.S.C. section 360bbb-3(b)(1), unless the authorization is terminated or revoked sooner. Performed at [facility], [place] [address] [place], [place] [address]   Surgical pathology     Status: None   Collection Time: 03/15/19  8:38 AM  Result Value Ref Range   SURGICAL PATHOLOGY      Surgical Pathology CASE: [id number] PATIENT: Darlyne Russian Surgical Pathology Report     SPECIMEN SUBMITTED: A. Colon polyp x5, cecum ascending; cold snare  CLINICAL HISTORY: None provided  PRE-OPERATIVE DIAGNOSIS: Family history of colon cancer, personal history of colon polyps  POST-OPERATIVE DIAGNOSIS: Diverticulosis, cecum/ascending colon polyps     DIAGNOSIS: A.  COLON POLYP X5, CECUM ASCENDING; COLD SNARE: - SESSILE SERRATED POLYP (2 FRAGMENTS). - TUBULAR ADENOMA (4 FRAGMENTS). - NEGATIVE FOR HIGH-GRADE DYSPLASIA AND MALIGNANCY.   GROSS DESCRIPTION: A. Labeled: Cold snare cecum/ascending polyp x5 Received: Formalin Tissue fragment(s): Multiple Size: Aggregate, 1.4 x 0.4 x 0.1 cm Description: Tan soft tissue fragments Entirely submitted in 1 cassette.   Final Diagnosis performed by Betsy Pries,  MD.    Electronically signed 03/16/2019 9:35:34AM The electronic signature indicates that the named Attending Pathologist has evaluated the sp ecimen  Technical component performed at [facility], [address], [place], [place] [address] Lab: [phone] Dir: Rush Farmer, MD, MMM  Professional component performed at [facility], [facility], [place], [place], [place] [address] Lab: [phone] Dir: Dellia Nims. Rubinas, MD     Assessment/Plan:  Hypertension, benign blood pressure control important in reducing the progression of atherosclerotic disease. On appropriate oral medications.   Chronic kidney disease (CKD), stage III (moderate) Could certainly contribute to lower extremity swelling  Swelling of limb I have had a long discussion with the patient regarding swelling and why it  causes symptoms.  Patient will begin wearing graduated compression stockings class 1 (20-30 mmHg) on a daily basis a prescription was given. The patient will  beginning wearing the stockings first thing in the morning and removing them in the evening. The patient is instructed specifically not to sleep in the stockings.   In addition, behavioral modification will be initiated.  This will include frequent elevation, use of over the counter pain medications and exercise such as walking.  I have reviewed systemic causes for chronic edema such as liver, kidney and cardiac etiologies.  The patient denies problems with these organ systems.    Consideration for a lymph pump will also be made based upon the effectiveness of conservative therapy.  This would help to improve the edema control and prevent sequela such as ulcers and infections   Patient should undergo duplex ultrasound of the venous system to ensure that DVT or reflux is not present.  The patient will follow-up with me after the ultrasound.        Leotis Pain 03/30/2019, 12:29 PM   This note was created with Dragon medical  transcription system.  Any errors from dictation are unintentional.

## 2019-03-30 NOTE — Assessment & Plan Note (Signed)
blood pressure control important in reducing the progression of atherosclerotic disease. On appropriate oral medications.  

## 2019-03-30 NOTE — Assessment & Plan Note (Signed)
Could certainly contribute to lower extremity swelling

## 2019-03-30 NOTE — Assessment & Plan Note (Signed)

## 2019-04-06 ENCOUNTER — Other Ambulatory Visit (INDEPENDENT_AMBULATORY_CARE_PROVIDER_SITE_OTHER): Payer: Self-pay | Admitting: Vascular Surgery

## 2019-04-06 DIAGNOSIS — M7989 Other specified soft tissue disorders: Secondary | ICD-10-CM

## 2019-04-06 DIAGNOSIS — M79606 Pain in leg, unspecified: Secondary | ICD-10-CM

## 2019-04-07 DIAGNOSIS — M170 Bilateral primary osteoarthritis of knee: Secondary | ICD-10-CM | POA: Diagnosis not present

## 2019-04-08 DIAGNOSIS — M47816 Spondylosis without myelopathy or radiculopathy, lumbar region: Secondary | ICD-10-CM | POA: Diagnosis not present

## 2019-04-09 ENCOUNTER — Encounter (INDEPENDENT_AMBULATORY_CARE_PROVIDER_SITE_OTHER): Payer: Self-pay | Admitting: Nurse Practitioner

## 2019-04-09 ENCOUNTER — Other Ambulatory Visit: Payer: Self-pay

## 2019-04-09 ENCOUNTER — Ambulatory Visit (INDEPENDENT_AMBULATORY_CARE_PROVIDER_SITE_OTHER): Payer: Medicare Other

## 2019-04-09 ENCOUNTER — Ambulatory Visit (INDEPENDENT_AMBULATORY_CARE_PROVIDER_SITE_OTHER): Payer: Medicare Other | Admitting: Nurse Practitioner

## 2019-04-09 VITALS — BP 112/68 | HR 65 | Resp 12 | Ht 72.0 in | Wt 229.0 lb

## 2019-04-09 DIAGNOSIS — M79606 Pain in leg, unspecified: Secondary | ICD-10-CM | POA: Diagnosis not present

## 2019-04-09 DIAGNOSIS — I890 Lymphedema, not elsewhere classified: Secondary | ICD-10-CM

## 2019-04-09 DIAGNOSIS — I872 Venous insufficiency (chronic) (peripheral): Secondary | ICD-10-CM | POA: Diagnosis not present

## 2019-04-09 DIAGNOSIS — M7989 Other specified soft tissue disorders: Secondary | ICD-10-CM | POA: Diagnosis not present

## 2019-04-09 DIAGNOSIS — E785 Hyperlipidemia, unspecified: Secondary | ICD-10-CM | POA: Diagnosis not present

## 2019-04-15 ENCOUNTER — Encounter (INDEPENDENT_AMBULATORY_CARE_PROVIDER_SITE_OTHER): Payer: Self-pay | Admitting: Nurse Practitioner

## 2019-04-15 DIAGNOSIS — I890 Lymphedema, not elsewhere classified: Secondary | ICD-10-CM | POA: Insufficient documentation

## 2019-04-15 DIAGNOSIS — I872 Venous insufficiency (chronic) (peripheral): Secondary | ICD-10-CM | POA: Insufficient documentation

## 2019-04-15 NOTE — Progress Notes (Signed)
SUBJECTIVE:  Patient ID: Melissa [name], female    DOB: 1951-06-19, 68 y.o.   MRN: [id number] Chief Complaint  Patient presents with  . Follow-up    HPI  Melissa [name] is a 68 y.o. female Patient is seen for evaluation of leg swelling. The patient first noticed the swelling remotely but is now concerned because of a significant increase in the overall edema. The swelling is associated with pain and discoloration. The patient notes that in the morning the legs are significantly improved but they steadily worsened throughout the course of the day. Elevation makes the legs better, dependency makes them much worse.   The left is greater than the right.    There is no history of ulcerations associated with the swelling.   The patient denies any recent changes in their medications.  The patient has not been wearing graduated compression.  The patient has no had any past angiography, interventions or vascular surgery.  The patient denies a history of DVT or PE. There is no prior history of phlebitis. There is no history of primary lymphedema.  There is no history of radiation treatment to the groin or pelvis No history of malignancies. No history of trauma or groin or pelvic surgery. No history of foreign travel or parasitic infections area   Non invasive studies show reflux in the CFV bilaterally and at the saphenofemoral junction on the left lower extremity.  The right lower extremity has it located in the mid femoral vein as well.  No evidence of DVT or superficial venous thrombosis.    Past Medical History:  Diagnosis Date  . Bruising   . Cervical radiculopathy   . Deficiency of vitamin B   . Depression   . Edema   . Flank pain   . Gross hematuria   . HTN (hypertension)   . Iron deficiency anemia   . Leukopenia   . OA (osteoarthritis)   . Obesity   . OSA on CPAP   . Osteopenia   . Perennial allergic rhinitis   . Renal cysts, acquired, bilateral   . RLS (restless  legs syndrome)   . Sickle cell trait ([place])   . Snoring   . Spinal stenosis     Past Surgical History:  Procedure Laterality Date  . BARIATRIC SURGERY    . CESAREAN SECTION     3 or more  . COLONOSCOPY WITH PROPOFOL N/A 03/15/2019   Procedure: COLONOSCOPY WITH PROPOFOL;  Surgeon: [name], MD;  Location: [facility] ENDOSCOPY;  Service: Gastroenterology;  Laterality: N/A;  . GASTRIC BYPASS    . TUBAL LIGATION      Social History   Socioeconomic History  . Marital status: Married    Spouse name: Malinda Mccroskey  . Number of children: 4  . Years of education: Not on file  . Highest education level: Some college, no degree  Occupational History  . Not on file  Social Needs  . Financial resource strain: Not hard at all  . Food insecurity    Worry: Never true    Inability: Never true  . Transportation needs    Medical: No    Non-medical: No  Tobacco Use  . Smoking status: Never Smoker  . Smokeless tobacco: Never Used  Substance and Sexual Activity  . Alcohol use: Not Currently    Alcohol/week: 0.0 standard drinks    Comment: occasionally  . Drug use: No  . Sexual activity: Yes    Partners: Male  Birth control/protection: Post-menopausal  Lifestyle  . Physical activity    Days per week: 0 days    Minutes per session: 0 min  . Stress: Not at all  Relationships  . Social connections    Talks on phone: More than three times a week    Gets together: More than three times a week    Attends religious service: Never    Active member of club or organization: No    Attends meetings of clubs or organizations: Never    Relationship status: Married  . Intimate partner violence    Fear of current or ex partner: No    Emotionally abused: No    Physically abused: No    Forced sexual activity: No  Other Topics Concern  . Not on file  Social History Narrative  . Not on file    Family History  Problem Relation Age of Onset  . Hypercholesterolemia Mother   . Heart  disease Mother   . Hypertension Mother   . Alcohol abuse Father   . Lung cancer Brother   . Alcohol abuse Brother   . Diabetes Mellitus II Sister   . Hypertension Maternal Grandmother   . Colon cancer Sister   . Breast cancer Neg Hx     Allergies  Allergen Reactions  . Contrast Media [Iodinated Diagnostic Agents]   . Shellfish Allergy     Edema     Review of Systems   Review of Systems: Negative Unless Checked Constitutional: [] Weight loss  [] Fever  [] Chills Cardiac: [] Chest pain   []  Atrial Fibrillation  [] Palpitations   [] Shortness of breath when laying flat   [] Shortness of breath with exertion. [] Shortness of breath at rest Vascular:  [] Pain in legs with walking   [] Pain in legs with standing [] Pain in legs when laying flat   [] Claudication    [] Pain in feet when laying flat    [] History of DVT   [] Phlebitis   [x] Swelling in legs   [] Varicose veins   [] Non-healing ulcers Pulmonary:   [] Uses home oxygen   [] Productive cough   [] Hemoptysis   [] Wheeze  [] COPD   [] Asthma Neurologic:  [] Dizziness   [] Seizures  [] Blackouts [] History of stroke   [] History of TIA  [] Aphasia   [] Temporary Blindness   [] Weakness or numbness in arm   [] Weakness or numbness in leg Musculoskeletal:   [] Joint swelling   [] Joint pain   [] Low back pain  []  History of Knee Replacement [] Arthritis [] back Surgeries  [x]  Spinal Stenosis    Hematologic:  [] Easy bruising  [] Easy bleeding   [] Hypercoagulable state   [] Anemic Gastrointestinal:  [] Diarrhea   [] Vomiting  [] Gastroesophageal reflux/heartburn   [] Difficulty swallowing. [] Abdominal pain Genitourinary:  [] Chronic kidney disease   [] Difficult urination  [] Anuric   [] Blood in urine [] Frequent urination  [] Burning with urination   [] Hematuria Skin:  [] Rashes   [] Ulcers [] Wounds Psychological:  [] History of anxiety   [x]  History of major depression  []  Memory Difficulties      OBJECTIVE:   Physical Exam  BP 112/68 (BP Location: Left Arm, Patient Position:  Sitting, Cuff Size: Large)   Pulse 65   Resp 12   Ht 6' (1.829 m)   Wt 229 lb (103.9 kg)   BMI 31.06 kg/m   Gen: WD/WN, NAD Head: [place]/AT, No temporalis wasting.  Ear/Nose/Throat: Hearing grossly intact, nares w/o erythema or drainage Eyes: PER, EOMI, sclera nonicteric.  Neck: Supple, no masses.  No JVD.  Pulmonary:  Good air movement,  no use of accessory muscles.  Cardiac: RRR Vascular: 2+ edema bilaterally Vessel Right Left  Radial Palpable Palpable  Dorsalis Pedis Palpable Palpable  Posterior Tibial Palpable Palpable   Gastrointestinal: soft, non-distended. No guarding/no peritoneal signs.  Musculoskeletal: M/S 5/5 throughout.  No deformity or atrophy.  Neurologic: Pain and light touch intact in extremities.  Symmetrical.  Speech is fluent. Motor exam as listed above. Psychiatric: Judgment intact, Mood & affect appropriate for pt's clinical situation. Dermatologic: No Venous rashes. No Ulcers Noted.  No changes consistent with cellulitis. Lymph : No Cervical lymphadenopathy, no lichenification or skin changes of chronic lymphedema.       ASSESSMENT AND PLAN:  1. Chronic venous insufficiency  Recommend:  The patient has large symptomatic varicose veins that are painful and associated with swelling.  I have had a long discussion with the patient regarding  varicose veins and why they cause symptoms.  Patient will begin wearing graduated compression stockings class 1 on a daily basis, beginning first thing in the morning and removing them in the evening. The patient is instructed specifically not to sleep in the stockings.    The patient  will also begin using over-the-counter analgesics such as Motrin 600 mg po TID to help control the symptoms.    In addition, behavioral modification including elevation during the day will be initiated.     Further plans will be based on the ultrasound results and whether conservative therapies are successful at eliminating the pain and  swelling.   2. Dyslipidemia Continue statin as ordered and reviewed, no changes at this time   3. Lymphedema See above   Current Outpatient Medications on File Prior to Visit  Medication Sig Dispense Refill  . aspirin 81 MG EC tablet TAKE 1 TABLET BY MOUTH EVERY DAY 30 tablet 0  . atorvastatin (LIPITOR) 40 MG tablet TAKE 1 TABLET BY MOUTH EVERY DAY 90 tablet 1  . Azelastine HCl 137 MCG/SPRAY SOLN Place 2 sprays into the nose daily. 2 sprays in each nostril daily 1 Bottle 5  . beclomethasone (QVAR) 80 MCG/ACT inhaler Inhale 2 puffs into the lungs 2 (two) times daily. Rinse mouth after use. 1 Inhaler 6  . Cholecalciferol (VITAMIN D3 ADULT GUMMIES PO) Take 2 each by mouth daily.     . ferrous sulfate (IRON SUPPLEMENT) 325 (65 FE) MG tablet Take 325 mg by mouth daily with breakfast.    . fluticasone (FLONASE) 50 MCG/ACT nasal spray SPRAY 2 SPRAYS INTO EACH NOSTRIL EVERY DAY 16 g 3  . loratadine (CLARITIN) 10 MG tablet Take 1 tablet (10 mg total) by mouth daily. 90 tablet 0  . losartan (COZAAR) 25 MG tablet TAKE 1 TABLET BY MOUTH EVERY DAY 90 tablet 0  . magnesium oxide (MAG-OX) 400 MG tablet Take 400 mg by mouth daily. Reported on 12/08/2015    . Multiple Vitamins-Minerals (CENTRUM SILVER) tablet Take 1 tablet by mouth daily. 30 tablet 0  . omeprazole (PRILOSEC) 20 MG capsule Take 1 capsule (20 mg total) by mouth daily. 30 capsule 11  . QVAR REDIHALER 80 MCG/ACT inhaler TAKE 1 PUFF BY MOUTH TWICE A DAY 10.6 g 2  . carbamazepine (TEGRETOL) 200 MG tablet TAKE 1 TABLET (200 MG TOTAL) BY MOUTH 3 (THREE) TIMES DAILY. (Patient not taking: Reported on 03/30/2019) 90 tablet 0  . traMADol (ULTRAM) 50 MG tablet Take 1 tablet (50 mg total) by mouth every 8 (eight) hours as needed. 90 tablet 0   No current facility-administered medications on file prior  to visit.     There are no Patient Instructions on file for this visit. No follow-ups on file.   Kris Hartmann, NP  This note was completed with  Sales executive.  Any errors are purely unintentional.

## 2019-04-22 ENCOUNTER — Other Ambulatory Visit: Payer: Self-pay | Admitting: *Deleted

## 2019-04-22 ENCOUNTER — Other Ambulatory Visit: Payer: Self-pay | Admitting: Family Medicine

## 2019-04-22 DIAGNOSIS — Z20822 Contact with and (suspected) exposure to covid-19: Secondary | ICD-10-CM

## 2019-04-22 DIAGNOSIS — Z20828 Contact with and (suspected) exposure to other viral communicable diseases: Secondary | ICD-10-CM

## 2019-04-22 DIAGNOSIS — R6889 Other general symptoms and signs: Secondary | ICD-10-CM | POA: Diagnosis not present

## 2019-04-23 LAB — NOVEL CORONAVIRUS, NAA: SARS-CoV-2, NAA: NOT DETECTED

## 2019-04-30 ENCOUNTER — Other Ambulatory Visit: Payer: Self-pay | Admitting: Internal Medicine

## 2019-05-01 ENCOUNTER — Encounter: Payer: Self-pay | Admitting: Emergency Medicine

## 2019-05-01 ENCOUNTER — Emergency Department
Admission: EM | Admit: 2019-05-01 | Discharge: 2019-05-01 | Disposition: A | Discharge status: Home / Self Care | Payer: Medicare Other | Attending: Emergency Medicine | Admitting: Emergency Medicine

## 2019-05-01 ENCOUNTER — Emergency Department: Payer: Medicare Other

## 2019-05-01 ENCOUNTER — Other Ambulatory Visit: Payer: Self-pay

## 2019-05-01 DIAGNOSIS — R918 Other nonspecific abnormal finding of lung field: Secondary | ICD-10-CM | POA: Diagnosis not present

## 2019-05-01 DIAGNOSIS — Z7982 Long term (current) use of aspirin: Secondary | ICD-10-CM | POA: Insufficient documentation

## 2019-05-01 DIAGNOSIS — U071 COVID-19: Secondary | ICD-10-CM | POA: Diagnosis not present

## 2019-05-01 DIAGNOSIS — Z79899 Other long term (current) drug therapy: Secondary | ICD-10-CM | POA: Insufficient documentation

## 2019-05-01 DIAGNOSIS — R05 Cough: Secondary | ICD-10-CM | POA: Diagnosis not present

## 2019-05-01 DIAGNOSIS — Z8616 Personal history of COVID-19: Secondary | ICD-10-CM

## 2019-05-01 DIAGNOSIS — B9789 Other viral agents as the cause of diseases classified elsewhere: Secondary | ICD-10-CM | POA: Diagnosis not present

## 2019-05-01 DIAGNOSIS — Z20828 Contact with and (suspected) exposure to other viral communicable diseases: Secondary | ICD-10-CM | POA: Diagnosis not present

## 2019-05-01 DIAGNOSIS — J069 Acute upper respiratory infection, unspecified: Secondary | ICD-10-CM | POA: Insufficient documentation

## 2019-05-01 DIAGNOSIS — N183 Chronic kidney disease, stage 3 (moderate): Secondary | ICD-10-CM | POA: Diagnosis not present

## 2019-05-01 DIAGNOSIS — I129 Hypertensive chronic kidney disease with stage 1 through stage 4 chronic kidney disease, or unspecified chronic kidney disease: Secondary | ICD-10-CM | POA: Insufficient documentation

## 2019-05-01 DIAGNOSIS — Z20822 Contact with and (suspected) exposure to covid-19: Secondary | ICD-10-CM

## 2019-05-01 HISTORY — DX: Personal history of COVID-19: Z86.16

## 2019-05-01 LAB — BASIC METABOLIC PANEL
Anion gap: 9 (ref 5–15)
BUN: 19 mg/dL (ref 8–23)
CO2: 24 mmol/L (ref 22–32)
Calcium: 9.4 mg/dL (ref 8.9–10.3)
Chloride: 109 mmol/L (ref 98–111)
Creatinine, Ser: 1.47 mg/dL — ABNORMAL HIGH (ref 0.44–1.00)
GFR calc Af Amer: 43 mL/min — ABNORMAL LOW (ref >60)
GFR calc non Af Amer: 37 mL/min — ABNORMAL LOW (ref >60)
Glucose, Bld: 108 mg/dL — ABNORMAL HIGH (ref 70–99)
Potassium: 4 mmol/L (ref 3.5–5.1)
Sodium: 142 mmol/L (ref 135–145)

## 2019-05-01 LAB — CBC
HCT: 37.3 % (ref 36.0–46.0)
Hemoglobin: 12.2 g/dL (ref 12.0–15.0)
MCH: 29.3 pg (ref 26.0–34.0)
MCHC: 32.7 g/dL (ref 30.0–36.0)
MCV: 89.7 fL (ref 80.0–100.0)
Platelets: 194 10*3/uL (ref 150–400)
RBC: 4.16 MIL/uL (ref 3.87–5.11)
RDW: 12 % (ref 11.5–15.5)
WBC: 3.9 10*3/uL — ABNORMAL LOW (ref 4.0–10.5)
nRBC: 0 % (ref 0.0–0.2)

## 2019-05-01 LAB — SARS CORONAVIRUS 2 (TAT 6-24 HRS): SARS Coronavirus 2: POSITIVE — AB

## 2019-05-01 LAB — TROPONIN I (HIGH SENSITIVITY): Troponin I (High Sensitivity): 6 ng/L (ref <18)

## 2019-05-01 NOTE — ED Triage Notes (Signed)
Patient states that her daughter was diagnosed with covid. Patient states that she was tested and it was negative. Patient states that she has had a cough times a week. Patient states that she developed central chest pain that radiates to her back yesterday.

## 2019-05-01 NOTE — ED Provider Notes (Signed)
[facility] Emergency Department Provider Note   ____________________________________________    I have reviewed the triage vital signs and the nursing notes.   HISTORY  Chief Complaint Cough and Chest Pain     HPI Melissa [name] is a 68 y.o. female with a history of cough and mild chest discomfort which is been ongoing for 1 to 2 days.  Last week her daughter was diagnosed with coronavirus.  She has had the cough for 5 days now.  Denies fevers.  Denies shortness of breath.  No nausea or vomiting.  Has been trying to isolate from her daughter however prior to the diagnosis had been interacting as families normally do  Past Medical History:  Diagnosis Date  . Bruising   . Cervical radiculopathy   . Deficiency of vitamin B   . Depression   . Edema   . Flank pain   . Gross hematuria   . HTN (hypertension)   . Iron deficiency anemia   . Leukopenia   . OA (osteoarthritis)   . Obesity   . OSA on CPAP   . Osteopenia   . Perennial allergic rhinitis   . Renal cysts, acquired, bilateral   . RLS (restless legs syndrome)   . Sickle cell trait ([place])   . Snoring   . Spinal stenosis     Patient Active Problem List   Diagnosis Date Noted  . Chronic venous insufficiency 04/15/2019  . Lymphedema 04/15/2019  . Swelling of limb 03/30/2019  . Family history of colon cancer   . Trigeminal neuralgia 09/11/2018  . Cardiac LV ejection fraction of 40-49% 05/07/2017  . Low back pain without sciatica 02/21/2017  . Renal cyst 10/18/2016  . Atherosclerosis of aorta ([place]) 10/16/2016  . History of colonic polyps 09/25/2016  . Dyslipidemia 12/08/2015  . Gastroesophageal reflux disease without esophagitis 08/11/2015  . Chronic kidney disease (CKD), stage III (moderate) ([place]) 07/02/2015  . Primary osteoarthritis of knee 06/13/2015  . Hip bursitis 06/13/2015  . RLS (restless legs syndrome) 06/13/2015  . Depression, major, in remission ([place]) 06/13/2015  . B12  deficiency 06/13/2015  . History of Roux-en-Y gastric bypass 06/13/2015  . History of iron deficiency anemia 06/13/2015  . Vitamin D deficiency 06/13/2015  . Hypertension, benign 06/13/2015  . Perennial allergic rhinitis with seasonal variation 06/13/2015  . Obesity (BMI 30.0-34.9) 06/13/2015  . Cervical radiculitis 01/04/2014  . Cervical spinal stenosis 01/04/2014  . Cervical osteoarthritis 01/04/2014    Past Surgical History:  Procedure Laterality Date  . BARIATRIC SURGERY    . CESAREAN SECTION     3 or more  . COLONOSCOPY WITH PROPOFOL N/A 03/15/2019   Procedure: COLONOSCOPY WITH PROPOFOL;  Surgeon: [name], MD;  Location: [facility] ENDOSCOPY;  Service: Gastroenterology;  Laterality: N/A;  . GASTRIC BYPASS    . TUBAL LIGATION      Prior to Admission medications   Medication Sig Start Date End Date Taking? Authorizing Provider  aspirin 81 MG EC tablet TAKE 1 TABLET BY MOUTH EVERY DAY 08/27/18   Ancil Boozer, Drue Stager, MD  atorvastatin (LIPITOR) 40 MG tablet TAKE 1 TABLET BY MOUTH EVERY DAY 01/24/19   Ancil Boozer, Drue Stager, MD  Azelastine HCl 137 MCG/SPRAY SOLN Place 2 sprays into the nose daily. 2 sprays in each nostril daily 05/19/18   Laverle Hobby, MD  beclomethasone (QVAR) 80 MCG/ACT inhaler Inhale 2 puffs into the lungs 2 (two) times daily. Rinse mouth after use. 04/28/18   Laverle Hobby, MD  carbamazepine (  TEGRETOL) 200 MG tablet TAKE 1 TABLET (200 MG TOTAL) BY MOUTH 3 (THREE) TIMES DAILY. Patient not taking: Reported on 03/30/2019 08/31/18   Steele Sizer, MD  Cholecalciferol (VITAMIN D3 ADULT GUMMIES PO) Take 2 each by mouth daily.     [provider]  ferrous sulfate (IRON SUPPLEMENT) 325 (65 FE) MG tablet Take 325 mg by mouth daily with breakfast.    [provider]  fluticasone (FLONASE) 50 MCG/ACT nasal spray SPRAY 2 SPRAYS INTO EACH NOSTRIL EVERY DAY 06/11/18   Laverle Hobby, MD  loratadine (CLARITIN) 10 MG tablet Take 1 tablet (10 mg  total) by mouth daily. 10/02/18   Steele Sizer, MD  losartan (COZAAR) 25 MG tablet TAKE 1 TABLET BY MOUTH EVERY DAY 02/24/19   Steele Sizer, MD  magnesium oxide (MAG-OX) 400 MG tablet Take 400 mg by mouth daily. Reported on 12/08/2015    [provider]  Multiple Vitamins-Minerals (CENTRUM SILVER) tablet Take 1 tablet by mouth daily. 07/02/18   Steele Sizer, MD  omeprazole (PRILOSEC) 20 MG capsule TAKE 1 CAPSULE BY MOUTH EVERY DAY 04/30/19   Laverle Hobby, MD  QVAR REDIHALER 80 MCG/ACT inhaler TAKE 1 PUFF BY MOUTH TWICE A DAY 01/13/19   Laverle Hobby, MD  traMADol (ULTRAM) 50 MG tablet Take 1 tablet (50 mg total) by mouth every 8 (eight) hours as needed. 11/27/18   Steele Sizer, MD     Allergies Contrast media [iodinated diagnostic agents] and Shellfish allergy  Family History  Problem Relation Age of Onset  . Hypercholesterolemia Mother   . Heart disease Mother   . Hypertension Mother   . Alcohol abuse Father   . Lung cancer Brother   . Alcohol abuse Brother   . Diabetes Mellitus II Sister   . Hypertension Maternal Grandmother   . Colon cancer Sister   . Breast cancer Neg Hx     Social History Social History   Tobacco Use  . Smoking status: Never Smoker  . Smokeless tobacco: Never Used  Substance Use Topics  . Alcohol use: Not Currently    Alcohol/week: 0.0 standard drinks    Comment: occasionally  . Drug use: No    Review of Systems  Constitutional: No fever/chills Eyes: No visual changes.  ENT: No sore throat. Cardiovascular as above Respiratory: As above Gastrointestinal: No abdominal pain.  No nausea, no vomiting.   Genitourinary: Negative for dysuria. Musculoskeletal: Chronic lower extremity swelling Skin: Negative for rash. Neurological: Negative for headaches or weakness   ____________________________________________   PHYSICAL EXAM:  VITAL SIGNS: ED Triage Vitals  Enc Vitals Group     BP 05/01/19 0638 124/77      Pulse Rate 05/01/19 0638 95     Resp 05/01/19 0638 18     Temp 05/01/19 0638 98.3 F (36.8 C)     Temp Source 05/01/19 0638 Oral     SpO2 05/01/19 0638 94 %     Weight 05/01/19 0634 100.7 kg (222 lb)     Height 05/01/19 0634 1.829 m (6')     Head Circumference --      Peak Flow --      Pain Score 05/01/19 0634 2     Pain Loc --      Pain Edu? --      Excl. in [place]? --     Constitutional: Alert and oriented. No acute distress. Pleasant and interactive Eyes: Conjunctivae are normal.   Nose: No congestion/rhinnorhea. Mouth/Throat: Mucous membranes are moist.   Cardiovascular: Normal  rate, regular rhythm.  Good peripheral circulation. Respiratory: Normal respiratory effort.  No retractions. Gastrointestinal: Soft and nontender. No distention.  No CVA tenderness. Genitourinary: deferred Musculoskeletal: No lower extremity tenderness nor edema.  Warm and well perfused Neurologic:  Normal speech and language. No gross focal neurologic deficits are appreciated.  Skin:  Skin is warm, dry and intact. No rash noted. Psychiatric: Mood and affect are normal. Speech and behavior are normal.  ____________________________________________   LABS (all labs ordered are listed, but only abnormal results are displayed)  Labs Reviewed  BASIC METABOLIC PANEL - Abnormal; Notable for the following components:      Result Value   Glucose, Bld 108 (*)    Creatinine, Ser 1.47 (*)    GFR calc non Af Amer 37 (*)    GFR calc Af Amer 43 (*)    All other components within normal limits  CBC - Abnormal; Notable for the following components:   WBC 3.9 (*)    All other components within normal limits  SARS CORONAVIRUS 2 (TAT 6-24 HRS)  TROPONIN I (HIGH SENSITIVITY)   ____________________________________________  EKG  ED ECG REPORT I, Lavonia Drafts, the attending physician, personally viewed and interpreted this ECG.  Date: 05/01/2019  Rhythm: normal sinus rhythm QRS Axis: normal Intervals:  normal ST/T Wave abnormalities: normal Narrative Interpretation: no evidence of acute ischemia  ____________________________________________  RADIOLOGY  Chest x-ray demonstrates ill-defined nodular opacities over the right upper lobe CT scan most consistent with chronic inflammatory process ____________________________________________   PROCEDURES  Procedure(s) performed: No  Procedures   Critical Care performed: No ____________________________________________   INITIAL IMPRESSION / ASSESSMENT AND PLAN / ED COURSE  Pertinent labs & imaging results that were available during my care of the patient were reviewed by me and considered in my medical decision making (see chart for details).  Patient presents with cough, family member with coronavirus, highly suspicious for coronavirus infection.  Chest x-ray is overall reassuring, some nodular opacities noted, will obtain CT scan.  CT scan most consistent with chronic inflammatory or infectious process, I discussed with the patient she will need pulmonology follow-up.  We have swabbed the patient for COVID, continue quarantine she has been doing    ____________________________________________   FINAL CLINICAL IMPRESSION(S) / ED DIAGNOSES  Final diagnoses:  Viral URI with cough  Close Exposure to Covid-19 Virus        Note:  This document was prepared using Dragon voice recognition software and may include unintentional dictation errors.   Lavonia Drafts, MD 05/01/19 [phone]

## 2019-05-03 ENCOUNTER — Ambulatory Visit: Payer: Self-pay

## 2019-05-03 ENCOUNTER — Ambulatory Visit (INDEPENDENT_AMBULATORY_CARE_PROVIDER_SITE_OTHER): Payer: Medicare Other | Admitting: Internal Medicine

## 2019-05-03 ENCOUNTER — Encounter: Payer: Self-pay | Admitting: Family Medicine

## 2019-05-03 ENCOUNTER — Ambulatory Visit (INDEPENDENT_AMBULATORY_CARE_PROVIDER_SITE_OTHER): Payer: Medicare Other | Admitting: Family Medicine

## 2019-05-03 ENCOUNTER — Encounter: Payer: Medicare Other | Admitting: Internal Medicine

## 2019-05-03 ENCOUNTER — Other Ambulatory Visit: Payer: Self-pay

## 2019-05-03 ENCOUNTER — Telehealth: Payer: Self-pay | Admitting: Internal Medicine

## 2019-05-03 DIAGNOSIS — J1289 Other viral pneumonia: Secondary | ICD-10-CM

## 2019-05-03 DIAGNOSIS — U071 COVID-19: Secondary | ICD-10-CM | POA: Diagnosis not present

## 2019-05-03 DIAGNOSIS — J4540 Moderate persistent asthma, uncomplicated: Secondary | ICD-10-CM | POA: Diagnosis not present

## 2019-05-03 DIAGNOSIS — R059 Cough, unspecified: Secondary | ICD-10-CM

## 2019-05-03 DIAGNOSIS — R05 Cough: Secondary | ICD-10-CM

## 2019-05-03 DIAGNOSIS — G4733 Obstructive sleep apnea (adult) (pediatric): Secondary | ICD-10-CM

## 2019-05-03 DIAGNOSIS — J1282 COVID-19: Secondary | ICD-10-CM

## 2019-05-03 MED ORDER — BENZONATATE 100 MG PO CAPS: 100.0000 mg | ORAL_CAPSULE | Freq: Two times a day (BID) | ORAL | 0 refills | Status: DC | PRN

## 2019-05-03 MED ORDER — PREDNISONE 10 MG (21) PO TBPK: ORAL_TABLET | ORAL | 0 refills | Status: DC

## 2019-05-03 MED ORDER — ALBUTEROL SULFATE HFA 108 (90 BASE) MCG/ACT IN AERS: 2.0000 | INHALATION_SPRAY | Freq: Four times a day (QID) | RESPIRATORY_TRACT | 0 refills | Status: DC | PRN

## 2019-05-03 MED ORDER — AZITHROMYCIN 250 MG PO TABS: 250.0000 mg | ORAL_TABLET | Freq: Every day | ORAL | 0 refills | Status: DC

## 2019-05-03 NOTE — Telephone Encounter (Signed)
Phone number provided is going straight to voicemail. Pt has called back and provided [place] with her Daughter's number. Pt has been rescheduled for 2:15 today.  Nothing further is needed at this time.

## 2019-05-03 NOTE — Progress Notes (Signed)
Name: Melissa [name]   MRN: [id number]    DOB: 02/07/1951   Date:05/03/2019       Progress Note  Subjective  Chief Complaint  Chief Complaint  Patient presents with  . COVID Positive    ER follow up-Exposed 2 weeks ago Saturday by her daughter friend that had it.  . Cough    White mucus.    I connected with  [name]  on 05/03/19 at  3:00 PM EDT by a video enabled telemedicine application and verified that I am speaking with the correct person using two identifiers.  I discussed the limitations of evaluation and management by telemedicine and the availability of in person appointments. The patient expressed understanding and agreed to proceed. Staff also discussed with the patient that there may be a patient responsible charge related to this service. Patient Location: at home  Provider Location: [facility]   HPI  COVID-19 : she started to cough last week, one day after her daughter was diagnosed with COVID-19. She went to [facility] on Saturday , had CXR , CT that showed possible pneumonia, she was   Patient Active Problem List   Diagnosis Date Noted  . Chronic venous insufficiency 04/15/2019  . Lymphedema 04/15/2019  . Swelling of limb 03/30/2019  . Family history of colon cancer   . Trigeminal neuralgia 09/11/2018  . Cardiac LV ejection fraction of 40-49% 05/07/2017  . Low back pain without sciatica 02/21/2017  . Renal cyst 10/18/2016  . Atherosclerosis of aorta ([place]) 10/16/2016  . History of colonic polyps 09/25/2016  . Dyslipidemia 12/08/2015  . Gastroesophageal reflux disease without esophagitis 08/11/2015  . Chronic kidney disease (CKD), stage III (moderate) ([place]) 07/02/2015  . Primary osteoarthritis of knee 06/13/2015  . Hip bursitis 06/13/2015  . RLS (restless legs syndrome) 06/13/2015  . Depression, major, in remission ([place]) 06/13/2015  . B12 deficiency 06/13/2015  . History of Roux-en-Y gastric bypass 06/13/2015  . History of iron  deficiency anemia 06/13/2015  . Vitamin D deficiency 06/13/2015  . Hypertension, benign 06/13/2015  . Perennial allergic rhinitis with seasonal variation 06/13/2015  . Obesity (BMI 30.0-34.9) 06/13/2015  . Cervical radiculitis 01/04/2014  . Cervical spinal stenosis 01/04/2014  . Cervical osteoarthritis 01/04/2014    Past Surgical History:  Procedure Laterality Date  . BARIATRIC SURGERY    . CESAREAN SECTION     3 or more  . COLONOSCOPY WITH PROPOFOL N/A 03/15/2019   Procedure: COLONOSCOPY WITH PROPOFOL;  Surgeon: [name], MD;  Location: [facility] ENDOSCOPY;  Service: Gastroenterology;  Laterality: N/A;  . GASTRIC BYPASS    . TUBAL LIGATION      Family History  Problem Relation Age of Onset  . Hypercholesterolemia Mother   . Heart disease Mother   . Hypertension Mother   . Alcohol abuse Father   . Lung cancer Brother   . Alcohol abuse Brother   . Diabetes Mellitus II Sister   . Hypertension Maternal Grandmother   . Colon cancer Sister   . Breast cancer Neg Hx     Social History   Socioeconomic History  . Marital status: Married    Spouse name: Kemauri Riveron  . Number of children: 4  . Years of education: Not on file  . Highest education level: Some college, no degree  Occupational History  . Not on file  Social Needs  . Financial resource strain: Not hard at all  . Food insecurity    Worry: Never true  Inability: Never true  . Transportation needs    Medical: No    Non-medical: No  Tobacco Use  . Smoking status: Never Smoker  . Smokeless tobacco: Never Used  Substance and Sexual Activity  . Alcohol use: Not Currently    Alcohol/week: 0.0 standard drinks    Comment: occasionally  . Drug use: No  . Sexual activity: Yes    Partners: Male    Birth control/protection: Post-menopausal  Lifestyle  . Physical activity    Days per week: 0 days    Minutes per session: 0 min  . Stress: Not at all  Relationships  . Social connections    Talks on phone:  More than three times a week    Gets together: More than three times a week    Attends religious service: Never    Active member of club or organization: No    Attends meetings of clubs or organizations: Never    Relationship status: Married  . Intimate partner violence    Fear of current or ex partner: No    Emotionally abused: No    Physically abused: No    Forced sexual activity: No  Other Topics Concern  . Not on file  Social History Narrative  . Not on file     Current Outpatient Medications:  .  aspirin 81 MG EC tablet, TAKE 1 TABLET BY MOUTH EVERY DAY, Disp: 30 tablet, Rfl: 0 .  atorvastatin (LIPITOR) 40 MG tablet, TAKE 1 TABLET BY MOUTH EVERY DAY, Disp: 90 tablet, Rfl: 1 .  Azelastine HCl 137 MCG/SPRAY SOLN, Place 2 sprays into the nose daily. 2 sprays in each nostril daily, Disp: 1 Bottle, Rfl: 5 .  azithromycin (ZITHROMAX) 250 MG tablet, Take 1 tablet (250 mg total) by mouth daily. Take 2 tabs at once on the first day, then once daily., Disp: 6 tablet, Rfl: 0 .  beclomethasone (QVAR) 80 MCG/ACT inhaler, Inhale 2 puffs into the lungs 2 (two) times daily. Rinse mouth after use., Disp: 1 Inhaler, Rfl: 6 .  Cholecalciferol (VITAMIN D3 ADULT GUMMIES PO), Take 2 each by mouth daily. , Disp: , Rfl:  .  ferrous sulfate (IRON SUPPLEMENT) 325 (65 FE) MG tablet, Take 325 mg by mouth daily with breakfast., Disp: , Rfl:  .  fluticasone (FLONASE) 50 MCG/ACT nasal spray, SPRAY 2 SPRAYS INTO EACH NOSTRIL EVERY DAY, Disp: 16 g, Rfl: 3 .  loratadine (CLARITIN) 10 MG tablet, Take 1 tablet (10 mg total) by mouth daily., Disp: 90 tablet, Rfl: 0 .  losartan (COZAAR) 25 MG tablet, TAKE 1 TABLET BY MOUTH EVERY DAY, Disp: 90 tablet, Rfl: 0 .  magnesium oxide (MAG-OX) 400 MG tablet, Take 400 mg by mouth daily. Reported on 12/08/2015, Disp: , Rfl:  .  Multiple Vitamins-Minerals (CENTRUM SILVER) tablet, Take 1 tablet by mouth daily., Disp: 30 tablet, Rfl: 0 .  omeprazole (PRILOSEC) 20 MG capsule, TAKE 1  CAPSULE BY MOUTH EVERY DAY, Disp: 90 capsule, Rfl: 3 .  predniSONE (STERAPRED UNI-PAK 21 TAB) 10 MG (21) TBPK tablet, Take as directed., Disp: 21 tablet, Rfl: 0 .  QVAR REDIHALER 80 MCG/ACT inhaler, TAKE 1 PUFF BY MOUTH TWICE A DAY, Disp: 10.6 g, Rfl: 2 .  benzonatate (TESSALON) 100 MG capsule, Take 1-2 capsules (100-200 mg total) by mouth 2 (two) times daily as needed., Disp: 60 capsule, Rfl: 0 .  carbamazepine (TEGRETOL) 200 MG tablet, TAKE 1 TABLET (200 MG TOTAL) BY MOUTH 3 (THREE) TIMES DAILY. (Patient not taking: Reported  on 05/03/2019), Disp: 90 tablet, Rfl: 0  Allergies  Allergen Reactions  . Contrast Media [Iodinated Diagnostic Agents]   . Shellfish Allergy     Edema    I personally reviewed active problem list, medication list, allergies, family history, social history with the patient/caregiver today.   ROS  Ten systems reviewed and is negative except as mentioned in HPI   Objective  Virtual encounter, vitals not obtained.  There is no height or weight on file to calculate BMI.  Physical Exam  Awake, alert and oriented and in no distress   Results for orders placed or performed during the hospital encounter of 05/01/19 (from the past 72 hour(s))  Basic metabolic panel     Status: Abnormal   Collection Time: 05/01/19  6:42 AM  Result Value Ref Range   Sodium 142 135 - 145 mmol/L   Potassium 4.0 3.5 - 5.1 mmol/L   Chloride 109 98 - 111 mmol/L   CO2 24 22 - 32 mmol/L   Glucose, Bld 108 (H) 70 - 99 mg/dL   BUN 19 8 - 23 mg/dL   Creatinine, Ser 1.47 (H) 0.44 - 1.00 mg/dL   Calcium 9.4 8.9 - 10.3 mg/dL   GFR calc non Af Amer 37 (L) >60 mL/min   GFR calc Af Amer 43 (L) >60 mL/min   Anion gap 9 5 - 15    Comment: Performed at [facility], [address] [place], [place] [address]  CBC     Status: Abnormal   Collection Time: 05/01/19  6:42 AM  Result Value Ref Range   WBC 3.9 (L) 4.0 - 10.5 K/uL   RBC 4.16 3.87 - 5.11 MIL/uL   Hemoglobin 12.2 12.0 - 15.0  g/dL   HCT 37.3 36.0 - 46.0 %   MCV 89.7 80.0 - 100.0 fL   MCH 29.3 26.0 - 34.0 pg   MCHC 32.7 30.0 - 36.0 g/dL   RDW 12.0 11.5 - 15.5 %   Platelets 194 150 - 400 K/uL   nRBC 0.0 0.0 - 0.2 %    Comment: Performed at [facility], [place], [place] [address]  Troponin I (High Sensitivity)     Status: None   Collection Time: 05/01/19  6:42 AM  Result Value Ref Range   Troponin I (High Sensitivity) 6 <18 ng/L    Comment: (NOTE) Elevated high sensitivity troponin I (hsTnI) values and significant  changes across serial measurements may suggest ACS but many other  chronic and acute conditions are known to elevate hsTnI results.  Refer to the "Links" section for chest pain algorithms and additional  guidance. Performed at [facility], [place], [place] [address]   SARS CORONAVIRUS 2 (TAT 6-24 HRS) Nasopharyngeal Nasopharyngeal Swab     Status: Abnormal   Collection Time: 05/01/19  8:44 AM   Specimen: Nasopharyngeal Swab  Result Value Ref Range   SARS Coronavirus 2 POSITIVE (A) NEGATIVE    Comment: (NOTE) SARS-CoV-2 target nucleic acids are DETECTED. The SARS-CoV-2 RNA is generally detectable in upper and lower respiratory specimens during the acute phase of infection. Positive results are indicative of active infection with SARS-CoV-2. Clinical  correlation with patient history and other diagnostic information is necessary to determine patient infection status. Positive results do  not rule out bacterial infection or co-infection with other viruses. The expected result is Negative. Fact Sheet for Patients: SugarRoll.be Fact Sheet for Healthcare Providers: https://www.woods-mathews.com/ This test is not yet approved  or cleared by the [place] and  has been authorized for detection and/or diagnosis of SARS-CoV-2 by FDA under an Emergency Use Authorization (EUA). This EUA will remain   in effect (meaning this test can be used) for the duration of the COVID-19 declaration under Section 564(b)(1) of the Act, 21 U.S.C.  section 360bbb-3(b)(1), unless the authorization is terminated or revoked sooner. Performed at [facility], [place] [address], [place], [place] [address]     PHQ2/9: Depression screen [facility] 2/9 05/03/2019 03/18/2019 02/05/2019 11/27/2018 10/02/2018  Decreased Interest 0 0 0 0 0  Down, Depressed, Hopeless 0 0 0 0 0  PHQ - 2 Score 0 0 0 0 0  Altered sleeping 0 0 0 0 0  Tired, decreased energy 0 0 0 0 1  Change in appetite 0 0 2 0 0  Feeling bad or failure about yourself  0 0 0 0 0  Trouble concentrating 0 0 0 0 0  Moving slowly or fidgety/restless 0 0 0 0 0  Suicidal thoughts 0 0 0 0 0  PHQ-9 Score 0 0 2 0 1  Difficult doing work/chores Not difficult at all Not difficult at all Not difficult at all - Not difficult at all  Some recent data might be hidden   PHQ-2/9 Result is negative.    Fall Risk: Fall Risk  05/03/2019 03/18/2019 02/05/2019 07/02/2018 02/24/2018  Falls in the past year? 0 0 0 0 No  Number falls in past yr: 0 0 0 - -  Injury with Fall? 0 0 0 - -  Follow up - Falls evaluation completed - - -     Assessment & Plan  1. COVID-19 virus infection  - albuterol (VENTOLIN HFA) 108 (90 Base) MCG/ACT inhaler; Inhale 2 puffs into the lungs every 6 (six) hours as needed for wheezing or shortness of breath.  Dispense: 18 g; Refill: 0  Gave information about monitoring pulse ox, when to go to [facility], continue self quarantine, stay hydrated, avoid sedating medications before bedtime, call 911 if needed   2. Cough  - benzonatate (TESSALON) 100 MG capsule; Take 1-2 capsules (100-200 mg total) by mouth 2 (two) times daily as needed.  Dispense: 60 capsule; Refill: 0 - albuterol (VENTOLIN HFA) 108 (90 Base) MCG/ACT inhaler; Inhale 2 puffs into the lungs every 6 (six) hours as needed for wheezing or shortness of breath.  Dispense: 18 g; Refill: 0  -  Temperature monitoring; Future  I discussed the assessment and treatment plan with the patient. The patient was provided an opportunity to ask questions and all were answered. The patient agreed with the plan and demonstrated an understanding of the instructions.  The patient was advised to call back or seek an in-person evaluation if the symptoms worsen or if the condition fails to improve as anticipated.  I provided 15  minutes of non-face-to-face time during this encounter.

## 2019-05-03 NOTE — Patient Instructions (Signed)
Will send in a prescription for antibiotic and predisone.  Continue to self quarantine for 10 days. You should avoid going to public places during that time.  Continue using cpap every night.

## 2019-05-03 NOTE — Telephone Encounter (Signed)
Pt. Reports she was in the ED this weekend for cough and chest pain. Diagnosed with COVID 40. States at first they " said I had pneumonia and then that I did not." Reports her husband and daughter are hospitalized with COVID 69. Reports she is nervous. " I don't want to get worse." Warm transfer to [facility] in the practice.  Answer Assessment - Initial Assessment Questions 1. COVID-19 DIAGNOSIS: "Who made your Coronavirus (COVID-19) diagnosis?" "Was it confirmed by a positive lab test?" If not diagnosed by a HCP, ask "Are there lots of cases (community spread) where you live?" (See public health department website, if unsure)     ED test positive 2. ONSET: "When did the COVID-19 symptoms start?"      Last week 3. WORST SYMPTOM: "What is your worst symptom?" (e.g., cough, fever, shortness of breath, muscle aches)     Cough 4. COUGH: "Do you have a cough?" If so, ask: "How bad is the cough?"       Yes 5. FEVER: "Do you have a fever?" If so, ask: "What is your temperature, how was it measured, and when did it start?"      No 6. RESPIRATORY STATUS: "Describe your breathing?" (e.g., shortness of breath, wheezing, unable to speak)      No 7. BETTER-SAME-WORSE: "Are you getting better, staying the same or getting worse compared to yesterday?"  If getting worse, ask, "In what way?"     Same 8. HIGH RISK DISEASE: "Do you have any chronic medical problems?" (e.g., asthma, heart or lung disease, weak immune system, etc.)     Asthma as a child 9. PREGNANCY: "Is there any chance you are pregnant?" "When was your last menstrual period?"     No 10. OTHER SYMPTOMS: "Do you have any other symptoms?"  (e.g., chills, fatigue, headache, loss of smell or taste, muscle pain, sore throat)       Tired  Protocols used: CORONAVIRUS (COVID-19) DIAGNOSED OR SUSPECTED-A-AH

## 2019-05-03 NOTE — Progress Notes (Signed)
 This encounter was created in error - please disregard.

## 2019-05-03 NOTE — Progress Notes (Signed)
[facility] Consultation     Virtual Visit via Telephone Note I connected with patient on 05/03/19 at  2:15 PM EDT by telephone and verified that I am speaking with the correct person using two identifiers.   I discussed the limitations, risks of performing an evaluation and management service by telephone and the availability of in person appointments. I also discussed with the patient that there may be a patient responsible charge related to this service.  In light of current covid-19 pandemic, patient also understands that we are trying to protect them by minimizing in office contact if at all possible.  The patient expressed understanding and agreed to proceed. Please see note below for further detail.    The patient was advised to call back or seek an in-person evaluation if the symptoms worsen or if the condition fails to improve as anticipated. I provided 15 minutes of non face-to-face time during this encounter.    Laverle Hobby, MD    Assessment and Plan:   Pneumonia due to COVID-19.   Acute bronchitis. Cough with sinus drainage. -Continued mild dyspnea.  CT chest shows evidence of patchy bilateral pneumonia. --We will give course of azithromycin, prednisone, in case she has a secondary bacterial infection.  Discussed continued quarantine.  GERD. - May be contributing to cough. - Continue omeprazole.  Chronic bronchitis, asthma. --There is some hyperinflation seen on imaging, but this may be due to her long torso and height, clinically she has no evidence of emphysema and no dyspnea. She does have mildly elevated eosinophil levels which may be suggestive of asthma-- also she has coughing with laughing.   Obstructive sleep apnea. - Doing well with CPAP, to continue.  Sleep maintenance insomnia. - She wakes up around 130 or 2 AM and has trouble falling back to sleep. - Discussed listening to a mindfulness/meditation app on headphones to help  her fall back to sleep.  Essential hypertension. --Obstructive sleep apnea can contribute to essential hypertension, therefore treatment of sleep apnea is important part of hypertension management.  Meds ordered this encounter  Medications  . azithromycin (ZITHROMAX) 250 MG tablet    Sig: Take 1 tablet (250 mg total) by mouth daily. Take 2 tabs at once on the first day, then once daily.    Dispense:  6 tablet    Refill:  0  . predniSONE (STERAPRED UNI-PAK 21 TAB) 10 MG (21) TBPK tablet    Sig: Take as directed.    Dispense:  21 tablet    Refill:  0   Return in about 6 months (around 10/31/2019).   Date: 05/03/2019  MRN# [id number] Melissa [name] 06-05-1951   Melissa [name] is a 68 y.o. old female seen in consultation for chief complaint of:    No chief complaint on file.   HPI:  Melissa [name] is a 68 y.o. female  with chronic cough, bronchitis/asthma.  Last visit she was sent for home sleep test, asked to use omeprazole, Flonase, Qvar.   She has been having cough for about a week, no fevers. She went to the ED because of persistent cough, and 4 days of chest pain. She had a COVID-19 test which came back positive. She lives with 5 family members, 3 of them are hospitalized in [place]. Her and a daughter are the only other ones at home. Her daughter has just been tested and is waiting for the results. She is currently isolating from her daughter.  She has sinus  pain in her left eye. No GI symptoms. No changes in eyes, no changes in digits.  She does not use any inhalers at home.  She is using CPAP every night for the entire night, she is more awake during the day since using the CPAP.  She appears to be tolerating it well at this time.   **CT chest 05/01/2019>> bilateral scattered infiltrates, predominantly upper lobe. **CPAP download 07/22/2018-08/20/2018>> usage greater than 4 hours 26/30 days.  Average usage on days used is 7 hours 9 minutes, pressure range 5-15.  Median  pressure 8, 95th percentile pressure 11, maximum pressure 13.  Leaks are within normal limits.  Residual AHI is 4.4 overall this shows good compliance with CPAP with excellent control of obstructive sleep apnea. **HST 06/03/2018>> mild sleep apnea with AHI of 10. **chest x-ray7/6/18>>  hyperinflation consistent with emphysema. **CBC 02/21/17>> eosinophils equals 300.  Medication:    Current Outpatient Medications:  .  aspirin 81 MG EC tablet, TAKE 1 TABLET BY MOUTH EVERY DAY, Disp: 30 tablet, Rfl: 0 .  atorvastatin (LIPITOR) 40 MG tablet, TAKE 1 TABLET BY MOUTH EVERY DAY, Disp: 90 tablet, Rfl: 1 .  Azelastine HCl 137 MCG/SPRAY SOLN, Place 2 sprays into the nose daily. 2 sprays in each nostril daily, Disp: 1 Bottle, Rfl: 5 .  beclomethasone (QVAR) 80 MCG/ACT inhaler, Inhale 2 puffs into the lungs 2 (two) times daily. Rinse mouth after use., Disp: 1 Inhaler, Rfl: 6 .  carbamazepine (TEGRETOL) 200 MG tablet, TAKE 1 TABLET (200 MG TOTAL) BY MOUTH 3 (THREE) TIMES DAILY. (Patient not taking: Reported on 03/30/2019), Disp: 90 tablet, Rfl: 0 .  Cholecalciferol (VITAMIN D3 ADULT GUMMIES PO), Take 2 each by mouth daily. , Disp: , Rfl:  .  ferrous sulfate (IRON SUPPLEMENT) 325 (65 FE) MG tablet, Take 325 mg by mouth daily with breakfast., Disp: , Rfl:  .  fluticasone (FLONASE) 50 MCG/ACT nasal spray, SPRAY 2 SPRAYS INTO EACH NOSTRIL EVERY DAY, Disp: 16 g, Rfl: 3 .  loratadine (CLARITIN) 10 MG tablet, Take 1 tablet (10 mg total) by mouth daily., Disp: 90 tablet, Rfl: 0 .  losartan (COZAAR) 25 MG tablet, TAKE 1 TABLET BY MOUTH EVERY DAY, Disp: 90 tablet, Rfl: 0 .  magnesium oxide (MAG-OX) 400 MG tablet, Take 400 mg by mouth daily. Reported on 12/08/2015, Disp: , Rfl:  .  Multiple Vitamins-Minerals (CENTRUM SILVER) tablet, Take 1 tablet by mouth daily., Disp: 30 tablet, Rfl: 0 .  omeprazole (PRILOSEC) 20 MG capsule, TAKE 1 CAPSULE BY MOUTH EVERY DAY, Disp: 90 capsule, Rfl: 3 .  QVAR REDIHALER 80 MCG/ACT inhaler,  TAKE 1 PUFF BY MOUTH TWICE A DAY, Disp: 10.6 g, Rfl: 2 .  traMADol (ULTRAM) 50 MG tablet, Take 1 tablet (50 mg total) by mouth every 8 (eight) hours as needed., Disp: 90 tablet, Rfl: 0   Allergies:  Contrast media [iodinated diagnostic agents] and Shellfish allergy    LABORATORY PANEL:   CBC Recent Labs  Lab 05/01/19 0642  WBC 3.9*  HGB 12.2  HCT 37.3  PLT 194   ------------------------------------------------------------------------------------------------------------------  Chemistries  Recent Labs  Lab 05/01/19 0642  NA 142  K 4.0  CL 109  CO2 24  GLUCOSE 108*  BUN 19  CREATININE 1.47*  CALCIUM 9.4   ------------------------------------------------------------------------------------------------------------------  Cardiac Enzymes No results for input(s): TROPONINI in the last 168 hours. ------------------------------------------------------------  RADIOLOGY:  No results found.     Thank  you for the consultation and for allowing [facility]  [facility] to assist in the care of your patient. Our recommendations are noted above.  Please contact us if we can be of further service.  Marda Stalker, M.D., F.C.C.P.  Board Certified in Internal Medicine, Pulmonary Medicine, [place], and Sleep Medicine.  [facility] Office Number: [phone]   05/03/2019

## 2019-05-04 ENCOUNTER — Emergency Department: Payer: Medicare Other

## 2019-05-04 ENCOUNTER — Other Ambulatory Visit: Payer: Self-pay

## 2019-05-04 ENCOUNTER — Emergency Department
Admission: EM | Admit: 2019-05-04 | Discharge: 2019-05-04 | Disposition: A | Discharge status: Home / Self Care | Payer: Medicare Other | Attending: Emergency Medicine | Admitting: Emergency Medicine

## 2019-05-04 ENCOUNTER — Encounter: Payer: Self-pay | Admitting: Emergency Medicine

## 2019-05-04 DIAGNOSIS — U071 COVID-19: Secondary | ICD-10-CM | POA: Diagnosis not present

## 2019-05-04 DIAGNOSIS — Z79899 Other long term (current) drug therapy: Secondary | ICD-10-CM | POA: Insufficient documentation

## 2019-05-04 DIAGNOSIS — R0602 Shortness of breath: Secondary | ICD-10-CM | POA: Diagnosis not present

## 2019-05-04 DIAGNOSIS — R0789 Other chest pain: Secondary | ICD-10-CM | POA: Diagnosis not present

## 2019-05-04 DIAGNOSIS — N183 Chronic kidney disease, stage 3 (moderate): Secondary | ICD-10-CM | POA: Insufficient documentation

## 2019-05-04 DIAGNOSIS — R05 Cough: Secondary | ICD-10-CM | POA: Insufficient documentation

## 2019-05-04 DIAGNOSIS — Z7982 Long term (current) use of aspirin: Secondary | ICD-10-CM | POA: Insufficient documentation

## 2019-05-04 DIAGNOSIS — R5383 Other fatigue: Secondary | ICD-10-CM | POA: Diagnosis not present

## 2019-05-04 DIAGNOSIS — R918 Other nonspecific abnormal finding of lung field: Secondary | ICD-10-CM | POA: Diagnosis not present

## 2019-05-04 DIAGNOSIS — I129 Hypertensive chronic kidney disease with stage 1 through stage 4 chronic kidney disease, or unspecified chronic kidney disease: Secondary | ICD-10-CM | POA: Insufficient documentation

## 2019-05-04 DIAGNOSIS — R531 Weakness: Secondary | ICD-10-CM | POA: Diagnosis present

## 2019-05-04 LAB — BASIC METABOLIC PANEL
Anion gap: 11 (ref 5–15)
BUN: 24 mg/dL — ABNORMAL HIGH (ref 8–23)
CO2: 18 mmol/L — ABNORMAL LOW (ref 22–32)
Calcium: 9.3 mg/dL (ref 8.9–10.3)
Chloride: 110 mmol/L (ref 98–111)
Creatinine, Ser: 1.67 mg/dL — ABNORMAL HIGH (ref 0.44–1.00)
GFR calc Af Amer: 37 mL/min — ABNORMAL LOW (ref >60)
GFR calc non Af Amer: 32 mL/min — ABNORMAL LOW (ref >60)
Glucose, Bld: 148 mg/dL — ABNORMAL HIGH (ref 70–99)
Potassium: 4.5 mmol/L (ref 3.5–5.1)
Sodium: 139 mmol/L (ref 135–145)

## 2019-05-04 LAB — CBC WITH DIFFERENTIAL/PLATELET
Abs Immature Granulocytes: 0.01 10*3/uL (ref 0.00–0.07)
Basophils Absolute: 0 10*3/uL (ref 0.0–0.1)
Basophils Relative: 0 %
Eosinophils Absolute: 0 10*3/uL (ref 0.0–0.5)
Eosinophils Relative: 0 %
HCT: 39.6 % (ref 36.0–46.0)
Hemoglobin: 12.6 g/dL (ref 12.0–15.0)
Immature Granulocytes: 0 %
Lymphocytes Relative: 31 %
Lymphs Abs: 1.1 10*3/uL (ref 0.7–4.0)
MCH: 29.1 pg (ref 26.0–34.0)
MCHC: 31.8 g/dL (ref 30.0–36.0)
MCV: 91.5 fL (ref 80.0–100.0)
Monocytes Absolute: 0.1 10*3/uL (ref 0.1–1.0)
Monocytes Relative: 4 %
Neutro Abs: 2.2 10*3/uL (ref 1.7–7.7)
Neutrophils Relative %: 65 %
Platelets: 227 10*3/uL (ref 150–400)
RBC: 4.33 MIL/uL (ref 3.87–5.11)
RDW: 11.9 % (ref 11.5–15.5)
WBC: 3.4 10*3/uL — ABNORMAL LOW (ref 4.0–10.5)
nRBC: 0 % (ref 0.0–0.2)

## 2019-05-04 LAB — TROPONIN I (HIGH SENSITIVITY): Troponin I (High Sensitivity): 5 ng/L (ref <18)

## 2019-05-04 NOTE — ED Notes (Signed)

## 2019-05-04 NOTE — ED Triage Notes (Signed)
Pt here with c/o shob and chest tightness, covid +, husband and 2 daughters are currently in the hospital with Covid as well. Pt states she was up moving around and doing well and hour ago, then became shob and felt chest tightness. NAD.

## 2019-05-04 NOTE — ED Triage Notes (Signed)
Pt denies any cp or tightness at this time, states "all of the sudden, I feel fine and don't even feel like I need to be here." Encouraged to let the Dr know and take a look at her. Pt agreed.

## 2019-05-04 NOTE — ED Notes (Signed)
See triage note. Pt presents to ED c/o brief episode of increased SOB earlier today and checked her O2 sat with a home pulse ox - 85% then 88%. Sat WNL on arrival to ED. Pt in NAD, able to ambulate easily.   Pt ambulated in room with cont pulse ox. Sat remained >96%. Pt tolerated activity well.

## 2019-05-04 NOTE — ED Provider Notes (Signed)
[facility] Emergency Department Provider Note   ____________________________________________   First MD Initiated Contact with Patient 05/04/19 1400     (approximate)  I have reviewed the triage vital signs and the nursing notes.   HISTORY  Chief Complaint Shortness of Breath and Covid +    HPI Melissa [name] is a 68 y.o. female with past medical history of hypertension and OSA presents to the ED complaining of generalized weakness and fatigue.  Patient was recently diagnosed with COVID-19 and currently has 3 family members admitted to the hospital with COVID-19.  She first developed a cough approximately 1 week ago, additionally had some sharp pain in her chest initially.  She states that the cough has been much better and she has not had any shortness of breath, very rarely has chest pain at this point.  She has been checking her pulse ox daily, and checked it again today when she was feeling more tired than usual.  She found her pulse ox to be 85% and then 88% at home.  She states she was not feeling especially short of breath and she has not had any recent fevers.  She now states she feels completely back to normal following arrival in the ED.        Past Medical History:  Diagnosis Date  . Bruising   . Cervical radiculopathy   . Deficiency of vitamin B   . Depression   . Edema   . Flank pain   . Gross hematuria   . HTN (hypertension)   . Iron deficiency anemia   . Leukopenia   . OA (osteoarthritis)   . Obesity   . OSA on CPAP   . Osteopenia   . Perennial allergic rhinitis   . Renal cysts, acquired, bilateral   . RLS (restless legs syndrome)   . Sickle cell trait ([place])   . Snoring   . Spinal stenosis     Patient Active Problem List   Diagnosis Date Noted  . Chronic venous insufficiency 04/15/2019  . Lymphedema 04/15/2019  . Swelling of limb 03/30/2019  . Family history of colon cancer   . Trigeminal neuralgia 09/11/2018  .  Cardiac LV ejection fraction of 40-49% 05/07/2017  . Low back pain without sciatica 02/21/2017  . Renal cyst 10/18/2016  . Atherosclerosis of aorta ([place]) 10/16/2016  . History of colonic polyps 09/25/2016  . Dyslipidemia 12/08/2015  . Gastroesophageal reflux disease without esophagitis 08/11/2015  . Chronic kidney disease (CKD), stage III (moderate) ([place]) 07/02/2015  . Primary osteoarthritis of knee 06/13/2015  . Hip bursitis 06/13/2015  . RLS (restless legs syndrome) 06/13/2015  . Depression, major, in remission ([place]) 06/13/2015  . B12 deficiency 06/13/2015  . History of Roux-en-Y gastric bypass 06/13/2015  . History of iron deficiency anemia 06/13/2015  . Vitamin D deficiency 06/13/2015  . Hypertension, benign 06/13/2015  . Perennial allergic rhinitis with seasonal variation 06/13/2015  . Obesity (BMI 30.0-34.9) 06/13/2015  . Cervical radiculitis 01/04/2014  . Cervical spinal stenosis 01/04/2014  . Cervical osteoarthritis 01/04/2014    Past Surgical History:  Procedure Laterality Date  . BARIATRIC SURGERY    . CESAREAN SECTION     3 or more  . COLONOSCOPY WITH PROPOFOL N/A 03/15/2019   Procedure: COLONOSCOPY WITH PROPOFOL;  Surgeon: [name], MD;  [facility] ENDOSCOPY;  Service: Gastroenterology;  Laterality: N/A;  . GASTRIC BYPASS    . TUBAL LIGATION      Prior to Admission medications  Medication Sig Start Date End Date Taking? Authorizing Provider  albuterol (VENTOLIN HFA) 108 (90 Base) MCG/ACT inhaler Inhale 2 puffs into the lungs every 6 (six) hours as needed for wheezing or shortness of breath. 05/03/19   [place]le Sizer, MD  aspirin 81 MG EC tablet TAKE 1 TABLET BY MOUTH EVERY DAY 08/27/18   Ancil Boozer, Drue Stager, MD  atorvastatin (LIPITOR) 40 MG tablet TAKE 1 TABLET BY MOUTH EVERY DAY 01/24/19   Ancil Boozer, Drue Stager, MD  Azelastine HCl 137 MCG/SPRAY SOLN Place 2 sprays into the nose daily. 2 sprays in each nostril daily 05/19/18   Laverle Hobby, MD   azithromycin (ZITHROMAX) 250 MG tablet Take 1 tablet (250 mg total) by mouth daily. Take 2 tabs at once on the first day, then once daily. 05/03/19   Laverle Hobby, MD  beclomethasone (QVAR) 80 MCG/ACT inhaler Inhale 2 puffs into the lungs 2 (two) times daily. Rinse mouth after use. 04/28/18   Laverle Hobby, MD  benzonatate (TESSALON) 100 MG capsule Take 1-2 capsules (100-200 mg total) by mouth 2 (two) times daily as needed. 05/03/19   [place]le Sizer, MD  Cholecalciferol (VITAMIN D3 ADULT GUMMIES PO) Take 2 each by mouth daily.     [provider]  ferrous sulfate (IRON SUPPLEMENT) 325 (65 FE) MG tablet Take 325 mg by mouth daily with breakfast.    [provider]  fluticasone (FLONASE) 50 MCG/ACT nasal spray SPRAY 2 SPRAYS INTO EACH NOSTRIL EVERY DAY 06/11/18   Laverle Hobby, MD  loratadine (CLARITIN) 10 MG tablet Take 1 tablet (10 mg total) by mouth daily. 10/02/18   [place]le Sizer, MD  losartan (COZAAR) 25 MG tablet TAKE 1 TABLET BY MOUTH EVERY DAY 02/24/19   [place] Sizer, MD  magnesium oxide (MAG-OX) 400 MG tablet Take 400 mg by mouth daily. Reported on 12/08/2015    [provider]  Multiple Vitamins-Minerals (CENTRUM SILVER) tablet Take 1 tablet by mouth daily. 07/02/18   [place]le Sizer, MD  omeprazole (PRILOSEC) 20 MG capsule TAKE 1 CAPSULE BY MOUTH EVERY DAY 04/30/19   Laverle Hobby, MD  predniSONE (STERAPRED UNI-PAK 21 TAB) 10 MG (21) TBPK tablet Take as directed. 05/03/19   Laverle Hobby, MD  QVAR REDIHALER 80 MCG/ACT inhaler TAKE 1 PUFF BY MOUTH TWICE A DAY 01/13/19   Laverle Hobby, MD    Allergies Contrast media [iodinated diagnostic agents] and Shellfish allergy  Family History  Problem Relation Age of Onset  . Hypercholesterolemia Mother   . Heart disease Mother   . Hypertension Mother   . Alcohol abuse Father   . Lung cancer Brother   . Alcohol abuse Brother   . Diabetes Mellitus II Sister   .  Hypertension Maternal Grandmother   . Colon cancer Sister   . Breast cancer Neg Hx     Social History Social History   Tobacco Use  . Smoking status: Never Smoker  . Smokeless tobacco: Never Used  Substance Use Topics  . Alcohol use: Not Currently    Alcohol/week: 0.0 standard drinks    Comment: occasionally  . Drug use: No    Review of Systems  Constitutional: No fever/chills.  Positive for generalized weakness and fatigue. Eyes: No visual changes. ENT: No sore throat. Cardiovascular: Denies chest pain. Respiratory: Denies shortness of breath. Gastrointestinal: No abdominal pain.  No nausea, no vomiting.  No diarrhea.  No constipation. Genitourinary: Negative for dysuria. Musculoskeletal: Negative for back pain. Skin: Negative for rash. Neurological: Negative for headaches, focal weakness or numbness.  ____________________________________________  PHYSICAL EXAM:  VITAL SIGNS: ED Triage Vitals  Enc Vitals Group     BP 05/04/19 1353 137/90     Pulse Rate 05/04/19 1353 93     Resp 05/04/19 1353 18     Temp 05/04/19 1353 97.7 F (36.5 C)     Temp Source 05/04/19 1353 Oral     SpO2 05/04/19 1353 95 %     Weight 05/04/19 1354 218 lb (98.9 kg)     Height 05/04/19 1354 6' (1.829 m)     Head Circumference --      Peak Flow --      Pain Score 05/04/19 1353 0     Pain Loc --      Pain Edu? --      Excl. in [place]? --     Constitutional: Alert and oriented. Eyes: Conjunctivae are normal. Head: Atraumatic. Nose: No congestion/rhinnorhea. Mouth/Throat: Mucous membranes are moist. Neck: Normal ROM Cardiovascular: Normal rate, regular rhythm. Grossly normal heart sounds. Respiratory: Normal respiratory effort.  No retractions. Lungs CTAB. Gastrointestinal: Soft and nontender. No distention. Genitourinary: deferred Musculoskeletal: No lower extremity tenderness nor edema. Neurologic:  Normal speech and language. No gross focal neurologic deficits are appreciated.  Skin:  Skin is warm, dry and intact. No rash noted. Psychiatric: Mood and affect are normal. Speech and behavior are normal.  ____________________________________________   LABS (all labs ordered are listed, but only abnormal results are displayed)  Labs Reviewed  BASIC METABOLIC PANEL - Abnormal; Notable for the following components:      Result Value   CO2 18 (*)    Glucose, Bld 148 (*)    BUN 24 (*)    Creatinine, Ser 1.67 (*)    GFR calc non Af Amer 32 (*)    GFR calc Af Amer 37 (*)    All other components within normal limits  CBC WITH DIFFERENTIAL/PLATELET - Abnormal; Notable for the following components:   WBC 3.4 (*)    All other components within normal limits  TROPONIN I (HIGH SENSITIVITY)  TROPONIN I (HIGH SENSITIVITY)   ____________________________________________  EKG  ED ECG REPORT I, Blake Divine, the attending physician, personally viewed and interpreted this ECG.   Date: 05/04/2019  EKG Time: 14:22  Rate: 81  Rhythm: normal sinus rhythm  Axis: Normal  Intervals:none  ST&T Change: None    PROCEDURES  Procedure(s) performed (including Critical Care):  Procedures   ____________________________________________   INITIAL IMPRESSION / ASSESSMENT AND PLAN / ED COURSE       68 year old female presenting to the ED with some generalized fatigue following recent COVID-19 diagnosis, noted hypoxia on her home pulse ox.  Upon arrival in the ED, patient states she is back to normal with no respiratory symptoms.  Her O2 sats are consistently greater than 95% on room air and she maintained sats after ambulating, remains asymptomatic.  EKG without acute ischemic changes, do not suspect ACS, will screen troponin.  Chest x-ray without any significant infiltrates, will screen additional labs but if unremarkable patient appropriate for discharge home.  Lab work unremarkable, patient continues to breathe without difficulty.  Counseled on return precautions,  patient agrees with plan.      ____________________________________________   FINAL CLINICAL IMPRESSION(S) / ED DIAGNOSES  Final diagnoses:  COVID-19 virus infection  Fatigue, unspecified type     ED Discharge Orders    None       Note:  This document was prepared using Dragon voice recognition software and may include unintentional  dictation errors.   Blake Divine, MD 05/04/19 [phone]

## 2019-05-10 ENCOUNTER — Telehealth: Payer: Self-pay | Admitting: *Deleted

## 2019-05-10 NOTE — Telephone Encounter (Signed)
Patient states that 4 out of the 5 people that live in her home have recently had COVID 19.  She states 3 of her family members were hospitalized, and have all been discharged now.  She states that 2 of the hospitalized family members were told to continue quarantine for 7 days after discharge, and her husband was told to quarantine for 14 days after discharge.  She also had COVID 19, but was not hospitalized.  She is asking if she should quarantine from her husband.  Advised everyone who was infected with COVID 19 should quarantine from the family member who did not have [place], but it is ok if they are around each other.  Discussed cleaning commonly used surfaces frequently, and advised that they should all quarantine for the longest amount recommended since they all live in the same home.  Patient expressed understanding.

## 2019-05-19 ENCOUNTER — Ambulatory Visit (INDEPENDENT_AMBULATORY_CARE_PROVIDER_SITE_OTHER): Payer: Medicare Other | Admitting: Family Medicine

## 2019-05-19 ENCOUNTER — Encounter: Payer: Self-pay | Admitting: Family Medicine

## 2019-05-19 ENCOUNTER — Other Ambulatory Visit: Payer: Self-pay

## 2019-05-19 DIAGNOSIS — Z8619 Personal history of other infectious and parasitic diseases: Secondary | ICD-10-CM | POA: Diagnosis not present

## 2019-05-19 DIAGNOSIS — R0602 Shortness of breath: Secondary | ICD-10-CM

## 2019-05-19 DIAGNOSIS — R05 Cough: Secondary | ICD-10-CM

## 2019-05-19 DIAGNOSIS — J42 Unspecified chronic bronchitis: Secondary | ICD-10-CM

## 2019-05-19 DIAGNOSIS — R059 Cough, unspecified: Secondary | ICD-10-CM

## 2019-05-19 DIAGNOSIS — Z8616 Personal history of COVID-19: Secondary | ICD-10-CM

## 2019-05-19 MED ORDER — ALBUTEROL SULFATE (2.5 MG/3ML) 0.083% IN NEBU: 2.5000 mg | INHALATION_SOLUTION | Freq: Four times a day (QID) | RESPIRATORY_TRACT | 0 refills | Status: DC | PRN

## 2019-05-19 MED ORDER — BUDESONIDE 0.5 MG/2ML IN SUSP: 0.5000 mg | Freq: Two times a day (BID) | RESPIRATORY_TRACT | 0 refills | Status: DC

## 2019-05-19 NOTE — Progress Notes (Signed)
Name: Melissa [name]   MRN: [id number]    DOB: [birth year]   Date:05/19/2019       Progress Note  Subjective  Chief Complaint  Chief Complaint  Patient presents with  . Referral    Wants a referral to Pulmonologist.  . Covid    follow up from covid. She has intermittent in her chest. She also feels it in the upper part of her back. Cough had resolved so she stopped taking cough medications. Cough came back she started taking medication again. She thinks she is experiencing some sob.    I connected with  Melissa [name]  on 05/19/19 at  3:00 PM EDT by a video enabled telemedicine application and verified that I am speaking with the correct person using two identifiers.  I discussed the limitations of evaluation and management by telemedicine and the availability of in person appointments. The patient expressed understanding and agreed to proceed. Staff also discussed with the patient that there may be a patient responsible charge related to this service. Patient Location: at home  Provider Location: [place]   HPI  History of COVID-19 pneumonia:  Symptoms started on the beginning of month, she had a CXR and CT on 05/01/2019 that showed multifocal pneumonia.   She was seen by Dr. [name] on 05/03/2019 and treated with Z-pack and prednisone taper , I also gave her a cough medication and albuterol inhaler. She states all her symptoms resolved except for fatigue however over the past few days she has noticed increase in SOB, dry cough, and increase in fatigue. No fever or chills. She denies orthopnea or lower extremity edema.    Patient Active Problem List   Diagnosis Date Noted  . Chronic venous insufficiency 04/15/2019  . Lymphedema 04/15/2019  . Swelling of limb 03/30/2019  . Family history of colon cancer   . Trigeminal neuralgia 09/11/2018  . Cardiac LV ejection fraction of 40-49% 05/07/2017  . Low back pain without sciatica 02/21/2017  . Renal cyst  10/18/2016  . Atherosclerosis of aorta ([place]) 10/16/2016  . History of colonic polyps 09/25/2016  . Dyslipidemia 12/08/2015  . Gastroesophageal reflux disease without esophagitis 08/11/2015  . Chronic kidney disease (CKD), stage III (moderate) ([place]) 07/02/2015  . Primary osteoarthritis of knee 06/13/2015  . Hip bursitis 06/13/2015  . RLS (restless legs syndrome) 06/13/2015  . Depression, major, in remission ([place]) 06/13/2015  . B12 deficiency 06/13/2015  . History of Roux-en-Y gastric bypass 06/13/2015  . History of iron deficiency anemia 06/13/2015  . Vitamin D deficiency 06/13/2015  . Hypertension, benign 06/13/2015  . Perennial allergic rhinitis with seasonal variation 06/13/2015  . Obesity (BMI 30.0-34.9) 06/13/2015  . Cervical radiculitis 01/04/2014  . Cervical spinal stenosis 01/04/2014  . Cervical osteoarthritis 01/04/2014    Past Surgical History:  Procedure Laterality Date  . BARIATRIC SURGERY    . CESAREAN SECTION     3 or more  . COLONOSCOPY WITH PROPOFOL N/A 03/15/2019   Procedure: COLONOSCOPY WITH PROPOFOL;  Surgeon: [name], MD;  [facility] ENDOSCOPY;  Service: Gastroenterology;  Laterality: N/A;  . GASTRIC BYPASS    . TUBAL LIGATION      Family History  Problem Relation Age of Onset  . Hypercholesterolemia Mother   . Heart disease Mother   . Hypertension Mother   . Alcohol abuse Father   . Lung cancer Brother   . Alcohol abuse Brother   . Diabetes Mellitus II Sister   . Hypertension Maternal Grandmother   .  Colon cancer Sister   . Breast cancer Neg Hx     Social History   Socioeconomic History  . Marital status: Married    Spouse name: [name]  . Number of children: 4  . Years of education: Not on file  . Highest education level: Some college, no degree  Occupational History  . Not on file  Social Needs  . Financial resource strain: Not hard at all  . Food insecurity    Worry: Never true    Inability: Never true  .  Transportation needs    Medical: No    Non-medical: No  Tobacco Use  . Smoking status: Never Smoker  . Smokeless tobacco: Never Used  Substance and Sexual Activity  . Alcohol use: Not Currently    Alcohol/week: 0.0 standard drinks    Comment: occasionally  . Drug use: No  . Sexual activity: Yes    Partners: Male    Birth control/protection: Post-menopausal  Lifestyle  . Physical activity    Days per week: 0 days    Minutes per session: 0 min  . Stress: Not at all  Relationships  . Social connections    Talks on phone: More than three times a week    Gets together: More than three times a week    Attends religious service: Never    Active member of club or organization: No    Attends meetings of clubs or organizations: Never    Relationship status: Married  . Intimate partner violence    Fear of current or ex partner: No    Emotionally abused: No    Physically abused: No    Forced sexual activity: No  Other Topics Concern  . Not on file  Social History Narrative  . Not on file     Current Outpatient Medications:  .  albuterol (VENTOLIN HFA) 108 (90 Base) MCG/ACT inhaler, Inhale 2 puffs into the lungs every 6 (six) hours as needed for wheezing or shortness of breath., Disp: 18 g, Rfl: 0 .  aspirin 81 MG EC tablet, TAKE 1 TABLET BY MOUTH EVERY DAY, Disp: 30 tablet, Rfl: 0 .  atorvastatin (LIPITOR) 40 MG tablet, TAKE 1 TABLET BY MOUTH EVERY DAY, Disp: 90 tablet, Rfl: 1 .  Azelastine HCl 137 MCG/SPRAY SOLN, Place 2 sprays into the nose daily. 2 sprays in each nostril daily, Disp: 1 Bottle, Rfl: 5 .  beclomethasone (QVAR) 80 MCG/ACT inhaler, Inhale 2 puffs into the lungs 2 (two) times daily. Rinse mouth after use., Disp: 1 Inhaler, Rfl: 6 .  benzonatate (TESSALON) 100 MG capsule, Take 1-2 capsules (100-200 mg total) by mouth 2 (two) times daily as needed., Disp: 60 capsule, Rfl: 0 .  Cholecalciferol (VITAMIN D3 ADULT GUMMIES PO), Take 2 each by mouth daily. , Disp: , Rfl:  .   ferrous sulfate (IRON SUPPLEMENT) 325 (65 FE) MG tablet, Take 325 mg by mouth daily with breakfast., Disp: , Rfl:  .  fluticasone (FLONASE) 50 MCG/ACT nasal spray, SPRAY 2 SPRAYS INTO EACH NOSTRIL EVERY DAY, Disp: 16 g, Rfl: 3 .  loratadine (CLARITIN) 10 MG tablet, Take 1 tablet (10 mg total) by mouth daily., Disp: 90 tablet, Rfl: 0 .  losartan (COZAAR) 25 MG tablet, TAKE 1 TABLET BY MOUTH EVERY DAY, Disp: 90 tablet, Rfl: 0 .  magnesium oxide (MAG-OX) 400 MG tablet, Take 400 mg by mouth daily. Reported on 12/08/2015, Disp: , Rfl:  .  Multiple Vitamins-Minerals (CENTRUM SILVER) tablet, Take 1 tablet by mouth  daily., Disp: 30 tablet, Rfl: 0 .  omeprazole (PRILOSEC) 20 MG capsule, TAKE 1 CAPSULE BY MOUTH EVERY DAY, Disp: 90 capsule, Rfl: 3 .  azithromycin (ZITHROMAX) 250 MG tablet, Take 1 tablet (250 mg total) by mouth daily. Take 2 tabs at once on the first day, then once daily. (Patient not taking: Reported on 05/19/2019), Disp: 6 tablet, Rfl: 0 .  predniSONE (STERAPRED UNI-PAK 21 TAB) 10 MG (21) TBPK tablet, Take as directed. (Patient not taking: Reported on 05/19/2019), Disp: 21 tablet, Rfl: 0 .  QVAR REDIHALER 80 MCG/ACT inhaler, TAKE 1 PUFF BY MOUTH TWICE A DAY (Patient not taking: Reported on 05/19/2019), Disp: 10.6 g, Rfl: 2  Allergies  Allergen Reactions  . Contrast Media [Iodinated Diagnostic Agents]   . Shellfish Allergy     Edema    I personally reviewed active problem list, medication list, allergies, family history, social history, health maintenance with the patient/caregiver today.   ROS  Ten systems reviewed and is negative except as mentioned in HPI   Objective  Virtual encounter, vitals not obtained.  There is no height or weight on file to calculate BMI.  Physical Exam  Awake, alert and oriented , seems sob when speaking in full sentences also dry cough   PHQ2/9: Depression screen [facility] 2/9 05/19/2019 05/03/2019 03/18/2019 02/05/2019 11/27/2018  Decreased Interest 0 0 0 0 0   Down, Depressed, Hopeless 0 0 0 0 0  PHQ - 2 Score 0 0 0 0 0  Altered sleeping 0 0 0 0 0  Tired, decreased energy 0 0 0 0 0  Change in appetite 0 0 0 2 0  Feeling bad or failure about yourself  0 0 0 0 0  Trouble concentrating 0 0 0 0 0  Moving slowly or fidgety/restless 0 0 0 0 0  Suicidal thoughts 0 0 0 0 0  PHQ-9 Score 0 0 0 2 0  Difficult doing work/chores - Not difficult at all Not difficult at all Not difficult at all -  Some recent data might be hidden   PHQ-2/9 Result is negative.    Fall Risk: Fall Risk  05/19/2019 05/03/2019 03/18/2019 02/05/2019 07/02/2018  Falls in the past year? 0 0 0 0 0  Number falls in past yr: 0 0 0 0 -  Injury with Fall? 0 0 0 0 -  Follow up - - Falls evaluation completed - -     Assessment & Plan  1. History of 2019 novel coronavirus disease (COVID-19)  - albuterol (PROVENTIL) (2.5 MG/3ML) 0.083% nebulizer solution; Take 3 mLs (2.5 mg total) by nebulization every 6 (six) hours as needed for wheezing or shortness of breath.  Dispense: 75 mL; Refill: 0 - budesonide (PULMICORT) 0.5 MG/2ML nebulizer solution; Take 2 mLs (0.5 mg total) by nebulization 2 (two) times daily.  Dispense: 120 mL; Refill: 0 - CBC with Differential/Platelet; Future - Comprehensive metabolic panel; Future - DG Chest 2 View; Future She states Dr. [name] no longer at [facility] but she would like to continue seeing him if he moved location to [place], we will find out who she can follow up with   2. Cough  - albuterol (PROVENTIL) (2.5 MG/3ML) 0.083% nebulizer solution; Take 3 mLs (2.5 mg total) by nebulization every 6 (six) hours as needed for wheezing or shortness of breath.  Dispense: 75 mL; Refill: 0 - budesonide (PULMICORT) 0.5 MG/2ML nebulizer solution; Take 2 mLs (0.5 mg total) by nebulization 2 (two) times daily.  Dispense: 120 mL; Refill:  0 - CBC with Differential/Platelet; Future - Comprehensive metabolic panel; Future - DG Chest 2 View; Future She has a  history of chronic bronchitis, but not using any inhalers at this time  3. SOB (shortness of breath)  - albuterol (PROVENTIL) (2.5 MG/3ML) 0.083% nebulizer solution; Take 3 mLs (2.5 mg total) by nebulization every 6 (six) hours as needed for wheezing or shortness of breath.  Dispense: 75 mL; Refill: 0 - budesonide (PULMICORT) 0.5 MG/2ML nebulizer solution; Take 2 mLs (0.5 mg total) by nebulization 2 (two) times daily.  Dispense: 120 mL; Refill: 0 - CBC with Differential/Platelet; Future - Comprehensive metabolic panel; Future - DG Chest 2 View; Future   4. Chronic bronchitis, unspecified chronic bronchitis type ([place])  We will find out if she can see Dr. Rosine Abe   The patient was advised to call back or seek an in-person evaluation if the symptoms worsen or if the condition fails to improve as anticipated.  I provided 25  minutes of non-face-to-face time during this encounter.

## 2019-05-21 ENCOUNTER — Ambulatory Visit
Admission: RE | Admit: 2019-05-21 | Discharge: 2019-05-21 | Disposition: A | Discharge status: Home / Self Care | Payer: Medicare Other | Source: Ambulatory Visit | Attending: Family Medicine | Admitting: Family Medicine

## 2019-05-21 ENCOUNTER — Other Ambulatory Visit
Admission: RE | Admit: 2019-05-21 | Discharge: 2019-05-21 | Disposition: A | Discharge status: Home / Self Care | Payer: Medicare Other | Source: Ambulatory Visit | Attending: Family Medicine | Admitting: Family Medicine

## 2019-05-21 ENCOUNTER — Other Ambulatory Visit: Payer: Self-pay | Admitting: Family Medicine

## 2019-05-21 DIAGNOSIS — R05 Cough: Secondary | ICD-10-CM | POA: Insufficient documentation

## 2019-05-21 DIAGNOSIS — Z8616 Personal history of COVID-19: Secondary | ICD-10-CM

## 2019-05-21 DIAGNOSIS — Z8619 Personal history of other infectious and parasitic diseases: Secondary | ICD-10-CM

## 2019-05-21 DIAGNOSIS — R0602 Shortness of breath: Secondary | ICD-10-CM

## 2019-05-21 DIAGNOSIS — R059 Cough, unspecified: Secondary | ICD-10-CM

## 2019-05-21 LAB — COMPREHENSIVE METABOLIC PANEL
ALT: 19 U/L (ref 0–44)
AST: 28 U/L (ref 15–41)
Albumin: 3.7 g/dL (ref 3.5–5.0)
Alkaline Phosphatase: 70 U/L (ref 38–126)
Anion gap: 7 (ref 5–15)
BUN: 18 mg/dL (ref 8–23)
CO2: 24 mmol/L (ref 22–32)
Calcium: 9.2 mg/dL (ref 8.9–10.3)
Chloride: 109 mmol/L (ref 98–111)
Creatinine, Ser: 1.46 mg/dL — ABNORMAL HIGH (ref 0.44–1.00)
GFR calc Af Amer: 43 mL/min — ABNORMAL LOW (ref >60)
GFR calc non Af Amer: 37 mL/min — ABNORMAL LOW (ref >60)
Glucose, Bld: 99 mg/dL (ref 70–99)
Potassium: 4.2 mmol/L (ref 3.5–5.1)
Sodium: 140 mmol/L (ref 135–145)
Total Bilirubin: 0.7 mg/dL (ref 0.3–1.2)
Total Protein: 6.9 g/dL (ref 6.5–8.1)

## 2019-05-21 LAB — CBC WITH DIFFERENTIAL/PLATELET
Abs Immature Granulocytes: 0.01 10*3/uL (ref 0.00–0.07)
Basophils Absolute: 0 10*3/uL (ref 0.0–0.1)
Basophils Relative: 1 %
Eosinophils Absolute: 0.1 10*3/uL (ref 0.0–0.5)
Eosinophils Relative: 2 %
HCT: 37.1 % (ref 36.0–46.0)
Hemoglobin: 12 g/dL (ref 12.0–15.0)
Immature Granulocytes: 0 %
Lymphocytes Relative: 35 %
Lymphs Abs: 1.7 10*3/uL (ref 0.7–4.0)
MCH: 29.5 pg (ref 26.0–34.0)
MCHC: 32.3 g/dL (ref 30.0–36.0)
MCV: 91.2 fL (ref 80.0–100.0)
Monocytes Absolute: 0.6 10*3/uL (ref 0.1–1.0)
Monocytes Relative: 13 %
Neutro Abs: 2.3 10*3/uL (ref 1.7–7.7)
Neutrophils Relative %: 49 %
Platelets: 200 10*3/uL (ref 150–400)
RBC: 4.07 MIL/uL (ref 3.87–5.11)
RDW: 12.8 % (ref 11.5–15.5)
WBC: 4.8 10*3/uL (ref 4.0–10.5)
nRBC: 0 % (ref 0.0–0.2)

## 2019-05-21 MED ORDER — LEVOFLOXACIN 500 MG PO TABS: 500.0000 mg | ORAL_TABLET | Freq: Every day | ORAL | 0 refills | Status: DC

## 2019-05-24 ENCOUNTER — Telehealth: Payer: Self-pay | Admitting: Family Medicine

## 2019-05-24 DIAGNOSIS — J42 Unspecified chronic bronchitis: Secondary | ICD-10-CM

## 2019-05-24 NOTE — Telephone Encounter (Signed)
Myra with [employer] calling for clarification due to the verbiage of albuterol (PROVENTIL) (2.5 MG/3ML) 0.083% nebulizer solution 1. The quantity needed  2. due to medicare guidelines a approved dx code  Medicare approves for J41.0 to J70.9   budesonide (PULMICORT) 0.5 MG/2ML nebulizer solution   1.Need an approved dx code   cb [id number]

## 2019-05-25 NOTE — Telephone Encounter (Signed)
Medicare no longer accepts PRN-per Dr. Ancil Boozer ok to call in verbally every 6 hours dosage for Medicare coverage with 120 quantity.

## 2019-05-26 ENCOUNTER — Other Ambulatory Visit: Payer: Self-pay | Admitting: Family Medicine

## 2019-05-26 DIAGNOSIS — I10 Essential (primary) hypertension: Secondary | ICD-10-CM

## 2019-06-02 ENCOUNTER — Encounter: Payer: Self-pay | Admitting: Family Medicine

## 2019-06-03 ENCOUNTER — Other Ambulatory Visit: Payer: Self-pay

## 2019-06-03 DIAGNOSIS — Z20822 Contact with and (suspected) exposure to covid-19: Secondary | ICD-10-CM

## 2019-06-05 LAB — NOVEL CORONAVIRUS, NAA: SARS-CoV-2, NAA: NOT DETECTED

## 2019-06-08 ENCOUNTER — Other Ambulatory Visit: Payer: Self-pay | Admitting: Family Medicine

## 2019-06-08 DIAGNOSIS — Z8619 Personal history of other infectious and parasitic diseases: Secondary | ICD-10-CM

## 2019-06-08 DIAGNOSIS — Z8616 Personal history of COVID-19: Secondary | ICD-10-CM

## 2019-06-08 DIAGNOSIS — R9389 Abnormal findings on diagnostic imaging of other specified body structures: Secondary | ICD-10-CM

## 2019-06-09 ENCOUNTER — Other Ambulatory Visit: Payer: Self-pay

## 2019-06-09 ENCOUNTER — Ambulatory Visit
Admission: RE | Admit: 2019-06-09 | Discharge: 2019-06-09 | Disposition: A | Discharge status: Home / Self Care | Payer: Medicare Other | Source: Ambulatory Visit | Attending: Family Medicine | Admitting: Family Medicine

## 2019-06-09 ENCOUNTER — Ambulatory Visit
Admission: RE | Admit: 2019-06-09 | Discharge: 2019-06-09 | Disposition: A | Discharge status: Home / Self Care | Payer: Medicare Other | Attending: Family Medicine | Admitting: Family Medicine

## 2019-06-09 ENCOUNTER — Telehealth: Payer: Self-pay | Admitting: Internal Medicine

## 2019-06-09 DIAGNOSIS — Z8616 Personal history of COVID-19: Secondary | ICD-10-CM

## 2019-06-09 DIAGNOSIS — R9389 Abnormal findings on diagnostic imaging of other specified body structures: Secondary | ICD-10-CM

## 2019-06-09 DIAGNOSIS — Z8619 Personal history of other infectious and parasitic diseases: Secondary | ICD-10-CM | POA: Insufficient documentation

## 2019-06-09 DIAGNOSIS — R918 Other nonspecific abnormal finding of lung field: Secondary | ICD-10-CM | POA: Diagnosis not present

## 2019-06-09 NOTE — Telephone Encounter (Signed)
Pt stated that she had CXR 04/24/2019 at ED and it was recommended to have a CT.  Pt is calling to f/u on CT. I have made pt aware that per last OV note, I do not see where a CT was mentioned by Dr. Ashby Dawes or ordered.  Pt has been scheduled on 06/22/2019 with DK to discuss CT further. Nothing further is needed.

## 2019-06-09 NOTE — Telephone Encounter (Signed)
Left message for pt

## 2019-06-14 DIAGNOSIS — I129 Hypertensive chronic kidney disease with stage 1 through stage 4 chronic kidney disease, or unspecified chronic kidney disease: Secondary | ICD-10-CM | POA: Insufficient documentation

## 2019-06-14 DIAGNOSIS — D631 Anemia in chronic kidney disease: Secondary | ICD-10-CM | POA: Insufficient documentation

## 2019-06-14 DIAGNOSIS — D573 Sickle-cell trait: Secondary | ICD-10-CM | POA: Insufficient documentation

## 2019-06-14 DIAGNOSIS — N1832 Chronic kidney disease, stage 3b: Secondary | ICD-10-CM | POA: Insufficient documentation

## 2019-06-14 DIAGNOSIS — N189 Chronic kidney disease, unspecified: Secondary | ICD-10-CM | POA: Insufficient documentation

## 2019-06-16 DIAGNOSIS — F411 Generalized anxiety disorder: Secondary | ICD-10-CM | POA: Diagnosis not present

## 2019-06-22 ENCOUNTER — Ambulatory Visit (INDEPENDENT_AMBULATORY_CARE_PROVIDER_SITE_OTHER): Payer: Medicare Other | Admitting: Internal Medicine

## 2019-06-22 ENCOUNTER — Other Ambulatory Visit: Payer: Self-pay

## 2019-06-22 ENCOUNTER — Encounter: Payer: Self-pay | Admitting: Internal Medicine

## 2019-06-22 DIAGNOSIS — J4520 Mild intermittent asthma, uncomplicated: Secondary | ICD-10-CM

## 2019-06-22 DIAGNOSIS — U071 COVID-19: Secondary | ICD-10-CM

## 2019-06-22 DIAGNOSIS — R911 Solitary pulmonary nodule: Secondary | ICD-10-CM

## 2019-06-22 NOTE — Progress Notes (Signed)
[facility] Consultation    **CT chest 05/01/2019>> bilateral scattered infiltrates, predominantly upper lobe. **CPAP download 07/22/2018-08/20/2018>> usage greater than 4 hours 26/30 days.  Average usage on days used is 7 hours 9 minutes, pressure range 5-15.  Median pressure 8, 95th percentile pressure 11, maximum pressure 13.  Leaks are within normal limits.  Residual AHI is 4.4 overall this shows good compliance with CPAP with excellent control of obstructive sleep apnea. **HST 06/03/2018>> mild sleep apnea with AHI of 10. **chest x-ray7/6/18>>  hyperinflation consistent with emphysema. **CBC 02/21/17>> eosinophils equals 300.   Date: 06/22/2019  MRN# [id number] Melissa [name] April 19, 1951   Melissa [name] is a 68 y.o. old female seen in consultation for chief complaint of:    CC Follow up COVID infection   HPI:  Recent COVID infection Feels better still with cough +SOB and DOE No fevers  No evidence of heart failure at this time No evidence or signs of infection at this time No respiratory distress No fevers, chills, nausea, vomiting, diarrhea No evidence of lower extremity edema No evidence hemoptysis  On CPAP therapy Doing well   Medication:    Current Outpatient Medications:  .  albuterol (PROVENTIL) (2.5 MG/3ML) 0.083% nebulizer solution, Take 3 mLs (2.5 mg total) by nebulization every 6 (six) hours as needed for wheezing or shortness of breath., Disp: 75 mL, Rfl: 0 .  albuterol (VENTOLIN HFA) 108 (90 Base) MCG/ACT inhaler, Inhale 2 puffs into the lungs every 6 (six) hours as needed for wheezing or shortness of breath., Disp: 18 g, Rfl: 0 .  aspirin 81 MG EC tablet, TAKE 1 TABLET BY MOUTH EVERY DAY, Disp: 30 tablet, Rfl: 0 .  atorvastatin (LIPITOR) 40 MG tablet, TAKE 1 TABLET BY MOUTH EVERY DAY, Disp: 90 tablet, Rfl: 1 .  Azelastine HCl 137 MCG/SPRAY SOLN, Place 2 sprays into the nose daily. 2 sprays in each nostril daily, Disp: 1 Bottle, Rfl: 5 .   benzonatate (TESSALON) 100 MG capsule, Take 1-2 capsules (100-200 mg total) by mouth 2 (two) times daily as needed., Disp: 60 capsule, Rfl: 0 .  budesonide (PULMICORT) 0.5 MG/2ML nebulizer solution, Take 2 mLs (0.5 mg total) by nebulization 2 (two) times daily., Disp: 120 mL, Rfl: 0 .  Cholecalciferol (VITAMIN D3 ADULT GUMMIES PO), Take 2 each by mouth daily. , Disp: , Rfl:  .  ferrous sulfate (IRON SUPPLEMENT) 325 (65 FE) MG tablet, Take 325 mg by mouth daily with breakfast., Disp: , Rfl:  .  fluticasone (FLONASE) 50 MCG/ACT nasal spray, SPRAY 2 SPRAYS INTO EACH NOSTRIL EVERY DAY, Disp: 16 g, Rfl: 3 .  levofloxacin (LEVAQUIN) 500 MG tablet, Take 1 tablet (500 mg total) by mouth daily., Disp: 7 tablet, Rfl: 0 .  loratadine (CLARITIN) 10 MG tablet, Take 1 tablet (10 mg total) by mouth daily., Disp: 90 tablet, Rfl: 0 .  losartan (COZAAR) 25 MG tablet, TAKE 1 TABLET BY MOUTH EVERY DAY, Disp: 90 tablet, Rfl: 0 .  magnesium oxide (MAG-OX) 400 MG tablet, Take 400 mg by mouth daily. Reported on 12/08/2015, Disp: , Rfl:  .  Multiple Vitamins-Minerals (CENTRUM SILVER) tablet, Take 1 tablet by mouth daily., Disp: 30 tablet, Rfl: 0 .  omeprazole (PRILOSEC) 20 MG capsule, TAKE 1 CAPSULE BY MOUTH EVERY DAY, Disp: 90 capsule, Rfl: 3   Allergies:  Contrast media [iodinated diagnostic agents] and Shellfish allergy   Review of Systems:  Gen:  Denies  fever, sweats, chills weight loss  HEENT: Denies blurred vision,  double vision, ear pain, eye pain, hearing loss, nose bleeds, sore throat Cardiac:  No dizziness, chest pain or heaviness, chest tightness,edema, No JVD Resp:   No cough, -sputum production, -shortness of breath,-wheezing, -hemoptysis,  Gi: Denies swallowing difficulty, stomach pain, nausea or vomiting, diarrhea, constipation, bowel incontinence Gu:  Denies bladder incontinence, burning urine Ext:   Denies Joint pain, stiffness or swelling Skin: Denies  skin rash, easy bruising or bleeding or hives  Endoc:  Denies polyuria, polydipsia , polyphagia or weight change Psych:   Denies depression, insomnia or hallucinations  Other:  All other systems negative    Assessment and Plan:   Pneumonia due to COVID-19.   Acute bronchitis. Cough with sinus drainage. CT chest reviewed with patient bilateral  Noludar opacities have slightly worsened   Re[pat CT chest in 6 months    Chronic bronchitis, asthma. -on budesonide  Alb neb as needed  Obstructive sleep apnea. On CPAP therapy Doing well with therapy  COVID-19 EDUCATION: The signs and symptoms of COVID-19 were discussed with the patient and how to seek care for testing.  The importance of social distancing was discussed today. Hand Washing Techniques and avoid touching face was advised.     MEDICATION ADJUSTMENTS/LABS AND TESTS ORDERED: CT chest  Continue inhalers as presribed   CURRENT MEDICATIONS REVIEWED AT LENGTH WITH PATIENT TODAY   Patient satisfied with Plan of action and management. All questions answered  Follow up in 6 months   Ayyan Sites Rosaland Pesa, M.D.  [facility]  Medical Director [place] Director [facility] Cardio-Pulmonary Department

## 2019-06-22 NOTE — Patient Instructions (Signed)
CT chest in 6 months.

## 2019-06-29 DIAGNOSIS — M79642 Pain in left hand: Secondary | ICD-10-CM | POA: Diagnosis not present

## 2019-06-29 DIAGNOSIS — S62616A Displaced fracture of proximal phalanx of right little finger, initial encounter for closed fracture: Secondary | ICD-10-CM | POA: Diagnosis not present

## 2019-06-29 DIAGNOSIS — M1812 Unilateral primary osteoarthritis of first carpometacarpal joint, left hand: Secondary | ICD-10-CM | POA: Diagnosis not present

## 2019-06-29 DIAGNOSIS — M79641 Pain in right hand: Secondary | ICD-10-CM | POA: Diagnosis not present

## 2019-07-01 ENCOUNTER — Ambulatory Visit (INDEPENDENT_AMBULATORY_CARE_PROVIDER_SITE_OTHER): Payer: Medicare Other

## 2019-07-01 ENCOUNTER — Other Ambulatory Visit: Payer: Self-pay

## 2019-07-01 DIAGNOSIS — M5412 Radiculopathy, cervical region: Secondary | ICD-10-CM | POA: Diagnosis not present

## 2019-07-01 DIAGNOSIS — M5030 Other cervical disc degeneration, unspecified cervical region: Secondary | ICD-10-CM | POA: Diagnosis not present

## 2019-07-01 DIAGNOSIS — Z23 Encounter for immunization: Secondary | ICD-10-CM

## 2019-07-02 DIAGNOSIS — I700 Atherosclerosis of aorta: Secondary | ICD-10-CM | POA: Diagnosis not present

## 2019-07-02 DIAGNOSIS — M170 Bilateral primary osteoarthritis of knee: Secondary | ICD-10-CM | POA: Diagnosis not present

## 2019-07-06 DIAGNOSIS — F411 Generalized anxiety disorder: Secondary | ICD-10-CM | POA: Diagnosis not present

## 2019-07-20 DIAGNOSIS — F411 Generalized anxiety disorder: Secondary | ICD-10-CM | POA: Diagnosis not present

## 2019-07-21 ENCOUNTER — Ambulatory Visit
Admission: RE | Admit: 2019-07-21 | Discharge: 2019-07-21 | Disposition: A | Discharge status: Home / Self Care | Payer: Medicare Other | Source: Ambulatory Visit | Attending: Family Medicine | Admitting: Family Medicine

## 2019-07-21 ENCOUNTER — Ambulatory Visit (INDEPENDENT_AMBULATORY_CARE_PROVIDER_SITE_OTHER): Payer: Medicare Other | Admitting: Family Medicine

## 2019-07-21 ENCOUNTER — Other Ambulatory Visit: Payer: Self-pay

## 2019-07-21 ENCOUNTER — Ambulatory Visit
Admission: RE | Admit: 2019-07-21 | Discharge: 2019-07-21 | Disposition: A | Discharge status: Home / Self Care | Payer: Medicare Other | Attending: Family Medicine | Admitting: Family Medicine

## 2019-07-21 ENCOUNTER — Encounter: Payer: Self-pay | Admitting: Family Medicine

## 2019-07-21 ENCOUNTER — Ambulatory Visit: Payer: Medicare Other | Admitting: Family Medicine

## 2019-07-21 ENCOUNTER — Ambulatory Visit: Admission: RE | Admit: 2019-07-21 | Payer: Medicare Other | Source: Home / Self Care

## 2019-07-21 VITALS — BP 136/72 | HR 99 | Temp 96.8°F | Resp 20 | Ht 72.0 in | Wt 226.7 lb

## 2019-07-21 DIAGNOSIS — Z8616 Personal history of COVID-19: Secondary | ICD-10-CM

## 2019-07-21 DIAGNOSIS — R9389 Abnormal findings on diagnostic imaging of other specified body structures: Secondary | ICD-10-CM | POA: Insufficient documentation

## 2019-07-21 DIAGNOSIS — R079 Chest pain, unspecified: Secondary | ICD-10-CM | POA: Diagnosis not present

## 2019-07-21 DIAGNOSIS — Z8619 Personal history of other infectious and parasitic diseases: Secondary | ICD-10-CM | POA: Diagnosis not present

## 2019-07-21 DIAGNOSIS — J449 Chronic obstructive pulmonary disease, unspecified: Secondary | ICD-10-CM | POA: Diagnosis not present

## 2019-07-21 DIAGNOSIS — M5441 Lumbago with sciatica, right side: Secondary | ICD-10-CM

## 2019-07-21 DIAGNOSIS — G4733 Obstructive sleep apnea (adult) (pediatric): Secondary | ICD-10-CM

## 2019-07-21 DIAGNOSIS — F325 Major depressive disorder, single episode, in full remission: Secondary | ICD-10-CM | POA: Diagnosis not present

## 2019-07-21 DIAGNOSIS — R059 Cough, unspecified: Secondary | ICD-10-CM

## 2019-07-21 DIAGNOSIS — G500 Trigeminal neuralgia: Secondary | ICD-10-CM | POA: Diagnosis not present

## 2019-07-21 DIAGNOSIS — R05 Cough: Secondary | ICD-10-CM | POA: Diagnosis not present

## 2019-07-21 DIAGNOSIS — I10 Essential (primary) hypertension: Secondary | ICD-10-CM

## 2019-07-21 DIAGNOSIS — N2581 Secondary hyperparathyroidism of renal origin: Secondary | ICD-10-CM | POA: Diagnosis not present

## 2019-07-21 DIAGNOSIS — E559 Vitamin D deficiency, unspecified: Secondary | ICD-10-CM

## 2019-07-21 DIAGNOSIS — F419 Anxiety disorder, unspecified: Secondary | ICD-10-CM

## 2019-07-21 DIAGNOSIS — R739 Hyperglycemia, unspecified: Secondary | ICD-10-CM

## 2019-07-21 DIAGNOSIS — I700 Atherosclerosis of aorta: Secondary | ICD-10-CM

## 2019-07-21 DIAGNOSIS — G8929 Other chronic pain: Secondary | ICD-10-CM

## 2019-07-21 DIAGNOSIS — Z9989 Dependence on other enabling machines and devices: Secondary | ICD-10-CM

## 2019-07-21 MED ORDER — HYDROXYZINE HCL 10 MG PO TABS: 10.0000 mg | ORAL_TABLET | Freq: Every day | ORAL | 0 refills | Status: DC

## 2019-07-21 MED ORDER — BENZONATATE 100 MG PO CAPS: 100.0000 mg | ORAL_CAPSULE | Freq: Two times a day (BID) | ORAL | 0 refills | Status: DC | PRN

## 2019-07-21 NOTE — Progress Notes (Signed)
Name: Melissa [name]   MRN: [id number]    DOB: 06-26-1951   Date:07/21/2019       Progress Note  Subjective  Chief Complaint  Chief Complaint  Patient presents with  . Cough    Dry cough and any fume she smells set her cough off and using nebulizer at home  . COVID-19    Still having pressure on her chest, cough and has had negative COVID test since  . Shoulder Pain    Onset-1 month, Right shoulder pain  . Abdominal Pain    Since August-right and left lower side, achy and constant    HPI  Trigeminal neuralgia: she has carbamazepine at home seen by neurologist at [facility] . Current in remission   Depression Major Recurrent in remission :  she has a long history of depression,she had relapse Fall 2018 when she decided to retire. She was crying, no energy, feeling overwhelmed. She finallyretiredJanuary 3rd, 2019, got married was travelling and doing well, however 2020 has been hard, she lost her husband, Jeneen Rinks, to COVID-08 Jul 2019. Youngest daughter lost her fiance . She is able to get up, cook, taking care of herself. Good support from her children   Bursitis  and lumbar radiculitis: had injections recently and is doing better, still seeing Dr. Phyllis Ginger. She has a follow up next month to decide what the next plain is. She continues to have a lot of pain, it affects her qualify of life. She needs to take breaks to cook.  She has noticed some pain on right lower quadrant that comes from her back, advised to discuss with Dr. Garen Grams   Neck pain: she has a long history of radiculitis, she had injectionsrecently and is doing well. Pain right now is0/10, she has some numbness on right index finger and right thumb. She has noticed some pain on trapezium area and sometimes right anterior chest  Bronchitis /Asthma : under the care for Dr. Mortimer Fries now, she had a recent follow up. She had COVID-19 pneumonia  04/2019. She is currently on Pulmicort , albuterol . Dr. Mortimer Fries advised repeat CT  in 6 months . She still has a cough and has been using neb therapy every 6 hours.   CKI stage III: seeing Dr. Abigail Butts.No itching and has normal urine ouput. Last GRF  was  43 during visit to [facility]. She also has secondary hyperparathyroidism   HTN:bp is at goal today.She is taking ARB, tolerating medication well. No chest pain, dizziness  or palpitation. Unchanged   Atherosclerosis: taking Atorvastatin, discussed resuming aspirn 81 mg .  OSA: recently diagnosed by Dr. [name], started CPAP and initially caused problems with her sleep but doing well now, very compliant. She is still waking up every 2 hours, she states she is able to fall asleep but takes a long time, discussed adding medication. Discussed sleep hygiene    History of obesity s/p surgery : doing well, will resume physical activity, she has noticed fatigue, history of iron deficiency anemia, doing well on supplements  Chronic constipation :she states she has always been constipated, she needs to be in complete silence.She stopped linzess because she takes too many medication, taking prunes, and has bowel movements daily but small amounts, having some intermittent dull on right lower quadrant, advised to resume linzess to see if helps.   Hyperlipidemia: taking atorvastatin , reviewed last labs   Lower extremity edema: seen by Dr. Lucky Cowboy, he discussed lymph pump and behavioral modification . [place] was  done 03/2019 and negative for DVT   Patient Active Problem List   Diagnosis Date Noted  . Secondary hyperparathyroidism of renal origin ([place]) 07/21/2019  . Chronic bronchitis ([place]) 05/19/2019  . Chronic venous insufficiency 04/15/2019  . Lymphedema 04/15/2019  . Swelling of limb 03/30/2019  . Family history of colon cancer   . Trigeminal neuralgia 09/11/2018  . Cardiac LV ejection fraction of 40-49% 05/07/2017  . Low back pain without sciatica 02/21/2017  . Renal cyst 10/18/2016  . Atherosclerosis of aorta ([place])  10/16/2016  . History of colonic polyps 09/25/2016  . Dyslipidemia 12/08/2015  . Gastroesophageal reflux disease without esophagitis 08/11/2015  . Chronic kidney disease (CKD), stage III (moderate) 07/02/2015  . Primary osteoarthritis of knee 06/13/2015  . Hip bursitis 06/13/2015  . RLS (restless legs syndrome) 06/13/2015  . Depression, major, in remission ([place]) 06/13/2015  . B12 deficiency 06/13/2015  . History of Roux-en-Y gastric bypass 06/13/2015  . History of iron deficiency anemia 06/13/2015  . Vitamin D deficiency 06/13/2015  . Hypertension, benign 06/13/2015  . Perennial allergic rhinitis with seasonal variation 06/13/2015  . Obesity (BMI 30.0-34.9) 06/13/2015  . Cervical radiculitis 01/04/2014  . Cervical spinal stenosis 01/04/2014  . Cervical osteoarthritis 01/04/2014    Past Surgical History:  Procedure Laterality Date  . BARIATRIC SURGERY    . CESAREAN SECTION     3 or more  . COLONOSCOPY WITH PROPOFOL N/A 03/15/2019   Procedure: COLONOSCOPY WITH PROPOFOL;  Surgeon: [name], MD;  Location: [facility] ENDOSCOPY;  Service: Gastroenterology;  Laterality: N/A;  . GASTRIC BYPASS    . TUBAL LIGATION      Family History  Problem Relation Age of Onset  . Hypercholesterolemia Mother   . Heart disease Mother   . Hypertension Mother   . Alcohol abuse Father   . Lung cancer Brother   . Alcohol abuse Brother   . Diabetes Mellitus II Sister   . Hypertension Maternal Grandmother   . Colon cancer Sister   . Breast cancer Neg Hx     Social History   Socioeconomic History  . Marital status: Widowed    Spouse name: Tanyika Mulberry  . Number of children: 4  . Years of education: Not on file  . Highest education level: Some college, no degree  Occupational History  . Not on file  Social Needs  . Financial resource strain: Not hard at all  . Food insecurity    Worry: Never true    Inability: Never true  . Transportation needs    Medical: No    Non-medical: No   Tobacco Use  . Smoking status: Never Smoker  . Smokeless tobacco: Never Used  Substance and Sexual Activity  . Alcohol use: Not Currently    Alcohol/week: 0.0 standard drinks    Comment: occasionally  . Drug use: No  . Sexual activity: Yes    Partners: Male    Birth control/protection: Post-menopausal  Lifestyle  . Physical activity    Days per week: 0 days    Minutes per session: 0 min  . Stress: Not at all  Relationships  . Social connections    Talks on phone: More than three times a week    Gets together: More than three times a week    Attends religious service: Never    Active member of club or organization: No    Attends meetings of clubs or organizations: Never    Relationship status: Married  . Intimate partner violence  Fear of current or ex partner: No    Emotionally abused: No    Physically abused: No    Forced sexual activity: No  Other Topics Concern  . Not on file  Social History Narrative  . Not on file     Current Outpatient Medications:  .  albuterol (PROVENTIL) (2.5 MG/3ML) 0.083% nebulizer solution, Take 3 mLs (2.5 mg total) by nebulization every 6 (six) hours as needed for wheezing or shortness of breath., Disp: 75 mL, Rfl: 0 .  albuterol (VENTOLIN HFA) 108 (90 Base) MCG/ACT inhaler, Inhale 2 puffs into the lungs every 6 (six) hours as needed for wheezing or shortness of breath., Disp: 18 g, Rfl: 0 .  aspirin 81 MG EC tablet, TAKE 1 TABLET BY MOUTH EVERY DAY, Disp: 30 tablet, Rfl: 0 .  atorvastatin (LIPITOR) 40 MG tablet, TAKE 1 TABLET BY MOUTH EVERY DAY, Disp: 90 tablet, Rfl: 1 .  benzonatate (TESSALON) 100 MG capsule, Take 1-2 capsules (100-200 mg total) by mouth 2 (two) times daily as needed., Disp: 60 capsule, Rfl: 0 .  budesonide (PULMICORT) 0.5 MG/2ML nebulizer solution, Take 2 mLs (0.5 mg total) by nebulization 2 (two) times daily., Disp: 120 mL, Rfl: 0 .  Cholecalciferol (VITAMIN D3 ADULT GUMMIES PO), Take 2 each by mouth daily. , Disp: ,  Rfl:  .  ferrous sulfate (IRON SUPPLEMENT) 325 (65 FE) MG tablet, Take 325 mg by mouth daily with breakfast., Disp: , Rfl:  .  levofloxacin (LEVAQUIN) 500 MG tablet, Take 1 tablet (500 mg total) by mouth daily., Disp: 7 tablet, Rfl: 0 .  loratadine (CLARITIN) 10 MG tablet, Take 1 tablet (10 mg total) by mouth daily., Disp: 90 tablet, Rfl: 0 .  losartan (COZAAR) 25 MG tablet, TAKE 1 TABLET BY MOUTH EVERY DAY, Disp: 90 tablet, Rfl: 0 .  magnesium oxide (MAG-OX) 400 MG tablet, Take 400 mg by mouth daily. Reported on 12/08/2015, Disp: , Rfl:  .  Multiple Vitamins-Minerals (CENTRUM SILVER) tablet, Take 1 tablet by mouth daily., Disp: 30 tablet, Rfl: 0 .  omeprazole (PRILOSEC) 20 MG capsule, TAKE 1 CAPSULE BY MOUTH EVERY DAY, Disp: 90 capsule, Rfl: 3 .  Azelastine HCl 137 MCG/SPRAY SOLN, Place 2 sprays into the nose daily. 2 sprays in each nostril daily (Patient not taking: Reported on 07/21/2019), Disp: 1 Bottle, Rfl: 5 .  fluticasone (FLONASE) 50 MCG/ACT nasal spray, SPRAY 2 SPRAYS INTO EACH NOSTRIL EVERY DAY (Patient not taking: Reported on 07/21/2019), Disp: 16 g, Rfl: 3  Allergies  Allergen Reactions  . Contrast Media [Iodinated Diagnostic Agents]   . Shellfish Allergy     Edema    I personally reviewed active problem list, medication list, allergies, family history, social history, health maintenance with the patient/caregiver today.   ROS  Constitutional: Negative for fever or weight change.  Respiratory: Negative for cough and shortness of breath.   Cardiovascular: Negative for chest pain or palpitations.  Gastrointestinal: Negative for abdominal pain, no bowel changes.  Musculoskeletal: Negative for gait problem or joint swelling.  Skin: Negative for rash.  Neurological: Negative for dizziness or headache.  No other specific complaints in a complete review of systems (except as listed in HPI above).  Objective  Vitals:   07/21/19 1153  BP: 136/72  Pulse: 99  Resp: 20  Temp: (!)  96.8 F (36 C)  TempSrc: Temporal  SpO2: 98%  Weight: 226 lb 11.2 oz (102.8 kg)  Height: 6' (1.829 m)    Body mass index is 30.75  kg/m.  Physical Exam  Constitutional: Patient appears well-developed and well-nourished. Obese No distress.  HEENT: head atraumatic, normocephalic, pupils equal and reactive to light Cardiovascular: Normal rate, regular rhythm and normal heart sounds.  No murmur heard. No BLE edema. Pulmonary/Chest: Effort normal and breath sounds normal. No respiratory distress. Abdominal: Soft.  There is no tenderness. Psychiatric: Patient has a normal mood and affect. behavior is normal. Judgment and thought content normal. Muscular Skeletal: pain during palpation of right lumbar spine, negative straight leg raise    Recent Results (from the past 2160 hour(s))  Basic metabolic panel     Status: Abnormal   Collection Time: 05/01/19  6:42 AM  Result Value Ref Range   Sodium 142 135 - 145 mmol/L   Potassium 4.0 3.5 - 5.1 mmol/L   Chloride 109 98 - 111 mmol/L   CO2 24 22 - 32 mmol/L   Glucose, Bld 108 (H) 70 - 99 mg/dL   BUN 19 8 - 23 mg/dL   Creatinine, Ser 1.47 (H) 0.44 - 1.00 mg/dL   Calcium 9.4 8.9 - 10.3 mg/dL   GFR calc non Af Amer 37 (L) >60 mL/min   GFR calc Af Amer 43 (L) >60 mL/min   Anion gap 9 5 - 15    Comment: Performed at [facility], [address] [place], [place] [address]  CBC     Status: Abnormal   Collection Time: 05/01/19  6:42 AM  Result Value Ref Range   WBC 3.9 (L) 4.0 - 10.5 K/uL   RBC 4.16 3.87 - 5.11 MIL/uL   Hemoglobin 12.2 12.0 - 15.0 g/dL   HCT 37.3 36.0 - 46.0 %   MCV 89.7 80.0 - 100.0 fL   MCH 29.3 26.0 - 34.0 pg   MCHC 32.7 30.0 - 36.0 g/dL   RDW 12.0 11.5 - 15.5 %   Platelets 194 150 - 400 K/uL   nRBC 0.0 0.0 - 0.2 %    Comment: Performed at [facility], [place], [place] [address]  Troponin I (High Sensitivity)     Status: None   Collection Time: 05/01/19  6:42 AM  Result Value  Ref Range   Troponin I (High Sensitivity) 6 <18 ng/L    Comment: (NOTE) Elevated high sensitivity troponin I (hsTnI) values and significant  changes across serial measurements may suggest ACS but many other  chronic and acute conditions are known to elevate hsTnI results.  Refer to the "Links" section for chest pain algorithms and additional  guidance. Performed at [facility], [place], [place] [address]   SARS CORONAVIRUS 2 (TAT 6-24 HRS) Nasopharyngeal Nasopharyngeal Swab     Status: Abnormal   Collection Time: 05/01/19  8:44 AM   Specimen: Nasopharyngeal Swab  Result Value Ref Range   SARS Coronavirus 2 POSITIVE (A) NEGATIVE    Comment: (NOTE) SARS-CoV-2 target nucleic acids are DETECTED. The SARS-CoV-2 RNA is generally detectable in upper and lower respiratory specimens during the acute phase of infection. Positive results are indicative of active infection with SARS-CoV-2. Clinical  correlation with patient history and other diagnostic information is necessary to determine patient infection status. Positive results do  not rule out bacterial infection or co-infection with other viruses. The expected result is Negative. Fact Sheet for Patients: SugarRoll.be Fact Sheet for Healthcare Providers: https://www.woods-mathews.com/ This test is not yet approved or cleared by the [place] FDA and  has been authorized for detection and/or diagnosis of SARS-CoV-2 by  FDA under an Emergency Use Authorization (EUA). This EUA will remain  in effect (meaning this test can be used) for the duration of the COVID-19 declaration under Section 564(b)(1) of the Act, 21 U.S.C.  section 360bbb-3(b)(1), unless the authorization is terminated or revoked sooner. Performed at [facility], [place] [address] [place], [place] [id number]   Basic metabolic panel     Status: Abnormal   Collection Time: 05/04/19  2:02 PM  Result  Value Ref Range   Sodium 139 135 - 145 mmol/L   Potassium 4.5 3.5 - 5.1 mmol/L   Chloride 110 98 - 111 mmol/L   CO2 18 (L) 22 - 32 mmol/L   Glucose, Bld 148 (H) 70 - 99 mg/dL   BUN 24 (H) 8 - 23 mg/dL   Creatinine, Ser 1.67 (H) 0.44 - 1.00 mg/dL   Calcium 9.3 8.9 - 10.3 mg/dL   GFR calc non Af Amer 32 (L) >60 mL/min   GFR calc Af Amer 37 (L) >60 mL/min   Anion gap 11 5 - 15    Comment: Performed at [facility], [address] [place], [place] [address]  CBC with Differential     Status: Abnormal   Collection Time: 05/04/19  2:02 PM  Result Value Ref Range   WBC 3.4 (L) 4.0 - 10.5 K/uL   RBC 4.33 3.87 - 5.11 MIL/uL   Hemoglobin 12.6 12.0 - 15.0 g/dL   HCT 39.6 36.0 - 46.0 %   MCV 91.5 80.0 - 100.0 fL   MCH 29.1 26.0 - 34.0 pg   MCHC 31.8 30.0 - 36.0 g/dL   RDW 11.9 11.5 - 15.5 %   Platelets 227 150 - 400 K/uL   nRBC 0.0 0.0 - 0.2 %   Neutrophils Relative % 65 %   Neutro Abs 2.2 1.7 - 7.7 K/uL   Lymphocytes Relative 31 %   Lymphs Abs 1.1 0.7 - 4.0 K/uL   Monocytes Relative 4 %   Monocytes Absolute 0.1 0.1 - 1.0 K/uL   Eosinophils Relative 0 %   Eosinophils Absolute 0.0 0.0 - 0.5 K/uL   Basophils Relative 0 %   Basophils Absolute 0.0 0.0 - 0.1 K/uL   Immature Granulocytes 0 %   Abs Immature Granulocytes 0.01 0.00 - 0.07 K/uL    Comment: Performed at [facility], [place], [place] [address]  Troponin I (High Sensitivity)     Status: None   Collection Time: 05/04/19  2:02 PM  Result Value Ref Range   Troponin I (High Sensitivity) 5 <18 ng/L    Comment: (NOTE) Elevated high sensitivity troponin I (hsTnI) values and significant  changes across serial measurements may suggest ACS but many other  chronic and acute conditions are known to elevate hsTnI results.  Refer to the "Links" section for chest pain algorithms and additional  guidance. Performed at [facility], [address] [place], [place] [address]   Comprehensive  metabolic panel     Status: Abnormal   Collection Time: 05/21/19  8:48 AM  Result Value Ref Range   Sodium 140 135 - 145 mmol/L   Potassium 4.2 3.5 - 5.1 mmol/L   Chloride 109 98 - 111 mmol/L   CO2 24 22 - 32 mmol/L   Glucose, Bld 99 70 - 99 mg/dL   BUN 18 8 - 23 mg/dL   Creatinine, Ser 1.46 (H) 0.44 - 1.00 mg/dL   Calcium 9.2 8.9 - 10.3 mg/dL  Total Protein 6.9 6.5 - 8.1 g/dL   Albumin 3.7 3.5 - 5.0 g/dL   AST 28 15 - 41 U/L   ALT 19 0 - 44 U/L   Alkaline Phosphatase 70 38 - 126 U/L   Total Bilirubin 0.7 0.3 - 1.2 mg/dL   GFR calc non Af Amer 37 (L) >60 mL/min   GFR calc Af Amer 43 (L) >60 mL/min   Anion gap 7 5 - 15    Comment: Performed at [facility], [address] [place], [place] [address]  CBC with Differential/Platelet     Status: None   Collection Time: 05/21/19  8:48 AM  Result Value Ref Range   WBC 4.8 4.0 - 10.5 K/uL   RBC 4.07 3.87 - 5.11 MIL/uL   Hemoglobin 12.0 12.0 - 15.0 g/dL   HCT 37.1 36.0 - 46.0 %   MCV 91.2 80.0 - 100.0 fL   MCH 29.5 26.0 - 34.0 pg   MCHC 32.3 30.0 - 36.0 g/dL   RDW 12.8 11.5 - 15.5 %   Platelets 200 150 - 400 K/uL   nRBC 0.0 0.0 - 0.2 %   Neutrophils Relative % 49 %   Neutro Abs 2.3 1.7 - 7.7 K/uL   Lymphocytes Relative 35 %   Lymphs Abs 1.7 0.7 - 4.0 K/uL   Monocytes Relative 13 %   Monocytes Absolute 0.6 0.1 - 1.0 K/uL   Eosinophils Relative 2 %   Eosinophils Absolute 0.1 0.0 - 0.5 K/uL   Basophils Relative 1 %   Basophils Absolute 0.0 0.0 - 0.1 K/uL   Immature Granulocytes 0 %   Abs Immature Granulocytes 0.01 0.00 - 0.07 K/uL    Comment: Performed at [facility], [address] [place], [place] [address]  Novel Coronavirus, NAA ([facility])     Status: None   Collection Time: 06/03/19 12:00 AM   Specimen: Oropharyngeal(OP) collection in vial transport medium   OROPHARYNGEA  TESTING  Result Value Ref Range   SARS-CoV-2, NAA Not Detected Not Detected    Comment: Testing was performed using the cobas(R)  SARS-CoV-2 test. This nucleic acid amplification test was developed and its performance characteristics determined by [employer]. Nucleic acid amplification tests include PCR and TMA. This test has not been FDA cleared or approved. This test has been authorized by FDA under an Emergency Use Authorization (EUA). This test is only authorized for the duration of time the declaration that circumstances exist justifying the authorization of the emergency use of in vitro diagnostic tests for detection of SARS-CoV-2 virus and/or diagnosis of COVID-19 infection under section 564(b)(1) of the Act, 21 U.S.C. [id number]) (1), unless the authorization is terminated or revoked sooner. When diagnostic testing is negative, the possibility of a false negative result should be considered in the context of a patient's recent exposures and the presence of clinical signs and symptoms consistent with COVID-19. An individual without symptoms  of COVID-19 and who is not shedding SARS-CoV-2 virus would expect to have a negative (not detected) result in this assay.       PHQ2/9: Depression screen [facility] 2/9 07/21/2019 05/19/2019 05/03/2019 03/18/2019 02/05/2019  Decreased Interest 0 0 0 0 0  Down, Depressed, Hopeless 0 0 0 0 0  PHQ - 2 Score 0 0 0 0 0  Altered sleeping 1 0 0 0 0  Tired, decreased energy 0 0 0 0 0  Change in appetite 0 0 0 0 2  Feeling bad or failure about yourself  0 0 0 0 0  Trouble concentrating 0 0 0 0 0  Moving slowly or fidgety/restless 0 0 0 0 0  Suicidal thoughts 0 0 0 0 0  PHQ-9 Score 1 0 0 0 2  Difficult doing work/chores Not difficult at all - Not difficult at all Not difficult at all Not difficult at all  Some recent data might be hidden    phq 9 is negative   Fall Risk: Fall Risk  07/21/2019 05/19/2019 05/03/2019 03/18/2019 02/05/2019  Falls in the past year? 0 0 0 0 0  Number falls in past yr: 0 0 0 0 0  Injury with Fall? 0 0 0 0 0  Follow up - - - Falls evaluation  completed -     Assessment & Plan  1. Atherosclerosis of aorta (HCC)  - Lipid panel  2. Secondary hyperparathyroidism of renal origin ([place])   3. Hypertension, benign   4. Chronic asthmatic bronchitis ([place])   5. Depression, major, in remission ([place])   6. History of 2019 novel coronavirus disease (COVID-19)  - DG Chest 2 View; Future  7. Abnormal CXR  - DG Chest 2 View; Future  8. Chronic right-sided low back pain with right-sided sciatica   9. OSA on CPAP   10. Trigeminal neuralgia  Doing well   11. Anxiety  - hydrOXYzine (ATARAX/VISTARIL) 10 MG tablet; Take 1-2 tablets (10-20 mg total) by mouth at bedtime.  Dispense: 60 tablet; Refill: 0  12. Cough  - benzonatate (TESSALON) 100 MG capsule; Take 1-2 capsules (100-200 mg total) by mouth 2 (two) times daily as needed.  Dispense: 60 capsule; Refill: 0  13. Vitamin D deficiency  - VITAMIN D 25 Hydroxy (Vit-D Deficiency, Fractures)  14. Hyperglycemia  - Hemoglobin A1c

## 2019-07-22 ENCOUNTER — Other Ambulatory Visit: Payer: Self-pay | Admitting: Family Medicine

## 2019-07-22 ENCOUNTER — Ambulatory Visit: Payer: Medicare Other | Admitting: Family Medicine

## 2019-07-22 DIAGNOSIS — K5909 Other constipation: Secondary | ICD-10-CM

## 2019-07-27 ENCOUNTER — Encounter: Payer: Self-pay | Admitting: Family Medicine

## 2019-07-27 ENCOUNTER — Other Ambulatory Visit: Payer: Self-pay | Admitting: Family Medicine

## 2019-07-27 DIAGNOSIS — K5909 Other constipation: Secondary | ICD-10-CM

## 2019-07-30 LAB — HEMOGLOBIN A1C
Hgb A1c MFr Bld: 5.5 % of total Hgb (ref <5.7)
Mean Plasma Glucose: 111 (calc)
eAG (mmol/L): 6.2 (calc)

## 2019-07-30 LAB — LIPID PANEL
Cholesterol: 220 mg/dL — ABNORMAL HIGH (ref <200)
HDL: 102 mg/dL (ref >=50)
LDL Cholesterol (Calc): 102 mg/dL (calc) — ABNORMAL HIGH
Non-HDL Cholesterol (Calc): 118 mg/dL (calc) (ref <130)
Total CHOL/HDL Ratio: 2.2 (calc) (ref <5.0)
Triglycerides: 72 mg/dL (ref <150)

## 2019-07-30 LAB — VITAMIN D 25 HYDROXY (VIT D DEFICIENCY, FRACTURES): Vit D, 25-Hydroxy: 26 ng/mL — ABNORMAL LOW (ref 30–100)

## 2019-08-03 ENCOUNTER — Other Ambulatory Visit: Payer: Self-pay | Admitting: Family Medicine

## 2019-08-03 DIAGNOSIS — Z1231 Encounter for screening mammogram for malignant neoplasm of breast: Secondary | ICD-10-CM

## 2019-08-04 MED ORDER — PREDNISONE 20 MG PO TABS: 20.0000 mg | ORAL_TABLET | Freq: Every day | ORAL | 0 refills | Status: DC

## 2019-08-04 NOTE — Telephone Encounter (Signed)
DK please advise. Thanks 

## 2019-08-06 ENCOUNTER — Other Ambulatory Visit: Payer: Self-pay | Admitting: Family Medicine

## 2019-08-06 DIAGNOSIS — I700 Atherosclerosis of aorta: Secondary | ICD-10-CM

## 2019-08-12 ENCOUNTER — Other Ambulatory Visit: Payer: Self-pay | Admitting: Family Medicine

## 2019-08-12 DIAGNOSIS — F419 Anxiety disorder, unspecified: Secondary | ICD-10-CM

## 2019-08-12 NOTE — Telephone Encounter (Signed)
Requested medication (s) are due for refill today: no  Requested medication (s) are on the active medication list: yes  Last refill:  07/21/2019  Future visit scheduled: yes  Notes to clinic:  requesting a 90 day supply Please advise    Requested Prescriptions  Pending Prescriptions Disp Refills   hydrOXYzine (ATARAX/VISTARIL) 10 MG tablet [Pharmacy Med Name: HYDROXYZINE HCL 10 MG TABLET] 180 tablet 1    Sig: Take 1-2 tablets (10-20 mg total) by mouth at bedtime.      Ear, Nose, and Throat:  Antihistamines Passed - 08/12/2019  8:41 AM      Passed - Valid encounter within last 12 months    Recent Outpatient Visits           3 weeks ago Atherosclerosis of aorta [facility])   [facility] Steele Sizer, MD   2 months ago History of 2019 novel coronavirus disease (COVID-19)   [facility] Steele Sizer, MD   3 months ago COVID-19 virus infection   [facility] Steele Sizer, MD   4 months ago Hypertension, benign   [place], FNP   6 months ago Hypertension, benign   [facility] Steele Sizer, MD       Future Appointments             In 2 months Ancil Boozer, Drue Stager, MD [facility], [facility]

## 2019-09-01 NOTE — Telephone Encounter (Signed)
   09/01/19 10:47 AM I will go to the ER if necessary however I would like to avoid that if possible. Can the doctor order the test at the hospital or an imaging location so that I can avoid the ER.  Thank you   DK please advise. Thanks

## 2019-09-01 NOTE — Telephone Encounter (Signed)
DK, please advise. Thanks 

## 2019-09-04 ENCOUNTER — Other Ambulatory Visit: Payer: Self-pay | Admitting: Family Medicine

## 2019-09-04 DIAGNOSIS — F419 Anxiety disorder, unspecified: Secondary | ICD-10-CM

## 2019-09-06 ENCOUNTER — Other Ambulatory Visit: Payer: Self-pay

## 2019-09-06 ENCOUNTER — Other Ambulatory Visit
Admission: RE | Admit: 2019-09-06 | Discharge: 2019-09-06 | Disposition: A | Discharge status: Home / Self Care | Payer: Medicare Other | Source: Ambulatory Visit | Attending: Acute Care | Admitting: Acute Care

## 2019-09-06 ENCOUNTER — Encounter: Payer: Self-pay | Admitting: Acute Care

## 2019-09-06 ENCOUNTER — Ambulatory Visit (INDEPENDENT_AMBULATORY_CARE_PROVIDER_SITE_OTHER): Payer: Medicare Other | Admitting: Acute Care

## 2019-09-06 VITALS — BP 130/68 | HR 95 | Temp 97.5°F | Ht 72.0 in | Wt 229.0 lb

## 2019-09-06 DIAGNOSIS — U071 COVID-19: Secondary | ICD-10-CM | POA: Insufficient documentation

## 2019-09-06 DIAGNOSIS — M7989 Other specified soft tissue disorders: Secondary | ICD-10-CM

## 2019-09-06 DIAGNOSIS — G4733 Obstructive sleep apnea (adult) (pediatric): Secondary | ICD-10-CM

## 2019-09-06 DIAGNOSIS — Z8616 Personal history of COVID-19: Secondary | ICD-10-CM | POA: Diagnosis not present

## 2019-09-06 DIAGNOSIS — R0602 Shortness of breath: Secondary | ICD-10-CM | POA: Insufficient documentation

## 2019-09-06 DIAGNOSIS — F325 Major depressive disorder, single episode, in full remission: Secondary | ICD-10-CM

## 2019-09-06 DIAGNOSIS — Z9989 Dependence on other enabling machines and devices: Secondary | ICD-10-CM | POA: Diagnosis not present

## 2019-09-06 DIAGNOSIS — R2241 Localized swelling, mass and lump, right lower limb: Secondary | ICD-10-CM

## 2019-09-06 DIAGNOSIS — R7989 Other specified abnormal findings of blood chemistry: Secondary | ICD-10-CM

## 2019-09-06 LAB — CBC WITH DIFFERENTIAL/PLATELET
Abs Immature Granulocytes: 0 10*3/uL (ref 0.00–0.07)
Basophils Absolute: 0.1 10*3/uL (ref 0.0–0.1)
Basophils Relative: 1 %
Eosinophils Absolute: 0.1 10*3/uL (ref 0.0–0.5)
Eosinophils Relative: 3 %
HCT: 37.1 % (ref 36.0–46.0)
Hemoglobin: 12.2 g/dL (ref 12.0–15.0)
Immature Granulocytes: 0 %
Lymphocytes Relative: 43 %
Lymphs Abs: 1.9 10*3/uL (ref 0.7–4.0)
MCH: 29.9 pg (ref 26.0–34.0)
MCHC: 32.9 g/dL (ref 30.0–36.0)
MCV: 90.9 fL (ref 80.0–100.0)
Monocytes Absolute: 0.6 10*3/uL (ref 0.1–1.0)
Monocytes Relative: 12 %
Neutro Abs: 1.9 10*3/uL (ref 1.7–7.7)
Neutrophils Relative %: 41 %
Platelets: 252 10*3/uL (ref 150–400)
RBC: 4.08 MIL/uL (ref 3.87–5.11)
RDW: 12.1 % (ref 11.5–15.5)
WBC: 4.6 10*3/uL (ref 4.0–10.5)
nRBC: 0 % (ref 0.0–0.2)

## 2019-09-06 LAB — FIBRIN DERIVATIVES D-DIMER (ARMC ONLY): Fibrin derivatives D-dimer (ARMC): 499.68 ng/mL (FEU) — ABNORMAL HIGH (ref 0.00–499.00)

## 2019-09-06 NOTE — Assessment & Plan Note (Signed)
Post COVID 04/2019 Plan D-dimer CTA or HRCT based on results of d dimer Lower extremity dopplers Follow up in 2 months with Dr. Mortimer Fries Please contact office for sooner follow up if symptoms do not improve or worsen or seek emergency care

## 2019-09-06 NOTE — Patient Instructions (Addendum)
It is good to see you today. We will check a d dimer today If this is abnormal we will get a CT Angio to rule out other potential problems. If your D dimer is normal, we will schedule an HRCT. CBC today to evaluate for anemia Please get your COVID vaccine 10/12/2019 as is scheduled. We will place an order for Pulmonary Function Tests to evaluate your pulmonary status This may take awhile to get done as we are very backed up.  Keep using your NEBs twice daily for the shortness of breath We will order Bilateral Lower extremity dopplers.   You are doing a great job with your CPAP therapy. You are benefiting from  therapy Continue on CPAP at bedtime. You appear to be benefiting from the treatment  Goal is to wear for at least 6 hours each night for maximal clinical benefit. Continue to work on weight loss, as the link between excess weight  and sleep apnea is well established.   Remember to establish a good bedtime routine, and work on sleep hygiene.  Limit daytime naps , avoid stimulants such as caffeine and nicotine close to bedtime, exercise daily to promote sleep quality, avoid heavy , spicy, fried , or rich foods before bed. Ensure adequate exposure to natural light during the day,establish a relaxing bedtime routine with a pleasant sleep environment ( Bedroom between 60 and 67 degrees, turn off bright lights , TV or device screens screens , consider black out curtains or white noise machines) Do not drive if sleepy. Remember to clean mask, tubing, filter, and reservoir once weekly with soapy water.  Follow up with Dr. Mortimer Fries or NP   In 2 months  or before as needed.    Continue going to counseling, as I hope this will help you deal with what this year has brought you and your family. Please contact office for sooner follow up if symptoms do not improve or worsen or seek emergency care

## 2019-09-06 NOTE — Assessment & Plan Note (Addendum)
Great compliance  AHI is well controlled Plan Continue on CPAP at bedtime. You appear to be benefiting from the treatment  Goal is to wear for at least 6 hours each night for maximal clinical benefit. Continue to work on weight loss, as the link between excess weight  and sleep apnea is well established.   Remember to establish a good bedtime routine, and work on sleep hygiene.  Limit daytime naps , avoid stimulants such as caffeine and nicotine close to bedtime, exercise daily to promote sleep quality, avoid heavy , spicy, fried , or rich foods before bed. Ensure adequate exposure to natural light during the day,establish a relaxing bedtime routine with a pleasant sleep environment ( Bedroom between 60 and 67 degrees, turn off bright lights , TV or device screens screens , consider black out curtains or white noise machines) Do not drive if sleepy. Remember to clean mask, tubing, filter, and reservoir once weekly with soapy water.  Follow up with Dr. Mortimer Fries or NP   In 2 months  or before as needed.

## 2019-09-06 NOTE — Assessment & Plan Note (Signed)
Now experiencing some post COVID exertional dyspnea Plan It is good to see you today. We will check a d dimer today If this is abnormal we will get a CT Angio to rule out other potential problems. If your D dimer is normal, we will schedule an HRCT. CBC today to evaluate for anemia Please get your COVID vaccine 10/12/2019 as is scheduled. We will place an order for Pulmonary Function Tests to evaluate your pulmonary status This may take awhile to get done as we are very backed up.  Keep using your NEBs twice daily for the shortness of breath We will order Bilateral Lower extremity dopplers today You will get a call to schedule these this week.  Follow up with Dr. Mortimer Fries in 2 months or sooner if diagnostics are + and need further follow up.

## 2019-09-06 NOTE — Progress Notes (Addendum)
History of Present Illness Melissa [name] is a 69 y.o. female with OSA om BiPAP, history of asthma as a child.  . She had COVID 04/2019. She is having post COVID dyspnea . She is followed by Dr. Mortimer Fries.    09/08/2019  Follow up for Continued shortness of breath post COVID. She states she feels she cannot take a deep breath. She had been advised in the past to go to the ED, but she has fear of getting re-exposed to COVID and has not felt comfortable doing this. She has exertional dyspnea. She is very tearful today. She is in therapy as she lost her husband to [place] and has what appears to be post traumatic stress syndrome  Pt. States she had COVID 04/2019. She lost her husband to [facility] 07/05/2019.He recovered and was discharged from [facility], but he became very sick after about 1 week at home and was readmitted. He did not survive his second intubation.  Four of her family members in [place] had [place] too.  She also has family from out of state who have had COVID.  Pt.  is very concerned about her post virus dyspnea. She did have a cough , but she states this has improved with use of her nebs and time. The cough was non-productive .She is using her nebs twice daily.  She states she has had some tightness and swelling in her right leg. She has a follow up visit with vascular surgery in 1 month. She denies any fever, chest pain, orthopnea or dyspnea.   Test Results: ** Covid 04/2019>> No hospitalization **CT chest 05/01/2019>> bilateral scattered infiltrates, predominantly upper lobe. **CPAP download 07/22/2018-08/20/2018>> usage greater than 4 hours 26/30 days.  Average usage on days used is 7 hours 9 minutes, pressure range 5-15.  Median pressure 8, 95th percentile pressure 11, maximum pressure 13.  Leaks are within normal limits.  Residual AHI is 4.4 overall this shows good compliance with CPAP with excellent control of obstructive sleep apnea. **HST 06/03/2018>> mild sleep apnea with AHI of 10. **chest  x-ray7/6/18>>  hyperinflation consistent with emphysema. **CBC 02/21/17>> eosinophils equals 300.  CBC Latest Ref Rng & Units 09/06/2019 05/21/2019 05/04/2019  WBC 4.0 - 10.5 K/uL 4.6 4.8 3.4(L)  Hemoglobin 12.0 - 15.0 g/dL 12.2 12.0 12.6  Hematocrit 36.0 - 46.0 % 37.1 37.1 39.6  Platelets 150 - 400 K/uL 252 200 227    BMP Latest Ref Rng & Units 05/21/2019 05/04/2019 05/01/2019  Glucose 70 - 99 mg/dL 99 148(H) 108(H)  BUN 8 - 23 mg/dL 18 24(H) 19  Creatinine 0.44 - 1.00 mg/dL 1.46(H) 1.67(H) 1.47(H)  BUN/Creat Ratio 6 - 22 (calc) - - -  Sodium 135 - 145 mmol/L 140 139 142  Potassium 3.5 - 5.1 mmol/L 4.2 4.5 4.0  Chloride 98 - 111 mmol/L 109 110 109  CO2 22 - 32 mmol/L 24 18(L) 24  Calcium 8.9 - 10.3 mg/dL 9.2 9.3 9.4    BNP No results found for: BNP  ProBNP No results found for: PROBNP  PFT No results found for: FEV1PRE, FEV1POST, FVCPRE, FVCPOST, TLC, DLCOUNC, PREFEV1FVCRT, PSTFEV1FVCRT  DG Chest 2 View  Result Date: 09/07/2019 CLINICAL DATA:  Shortness of breath and cough. Chest pain. Positive D-dimer study. Previous COVID-19 positive EXAM: CHEST - 2 VIEW COMPARISON:  July 21, 2019 chest radiograph. Chest CT May 01, 2019 FINDINGS: Ill-defined opacities are noted in the right upper lobe toward the apex, stable. There is slight bibasilar atelectasis. Lungs elsewhere are  clear. Heart size and pulmonary vascularity are normal. No adenopathy. There is degenerative change in the thoracic spine. IMPRESSION: Ill-defined opacities in the right upper lobe toward the apex, stable. Question scarring from previous pneumonia in these areas. Lungs elsewhere clear except for slight bibasilar atelectasis. Cardiac silhouette is within normal limits. No adenopathy. Electronically Signed   By: Lowella Grip III M.D.   On: 09/07/2019 13:44   NM Pulmonary Perfusion  Result Date: 09/07/2019 CLINICAL DATA:  Shortness of breath.  Positive D-dimer study EXAM: NUCLEAR MEDICINE PERFUSION LUNG SCAN  TECHNIQUE: Perfusion images were obtained in multiple projections after intravenous injection of radiopharmaceutical. Ventilation scans intentionally deferred if perfusion scan and chest x-ray adequate for interpretation during COVID 19 epidemic. Views: Anterior, posterior, left lateral, right lateral, RPO, LPO, RAO, LAO RADIOPHARMACEUTICALS:  4.23 mCi Tc-23m MAA IV COMPARISON:  Chest radiograph September 07, 2019 FINDINGS: Radiotracer uptake is homogeneous and symmetric bilaterally. No perfusion defects are evident. IMPRESSION: No appreciable perfusion defects. Very low probability of pulmonary embolus. Electronically Signed   By: Lowella Grip III M.D.   On: 09/07/2019 13:43   US Venous Img Lower Bilateral (DVT)  Result Date: 09/07/2019 CLINICAL DATA:  69 year old female with a history of leg swelling and pain EXAM: BILATERAL LOWER EXTREMITY VENOUS DOPPLER ULTRASOUND TECHNIQUE: Gray-scale sonography with graded compression, as well as color Doppler and duplex ultrasound were performed to evaluate the lower extremity deep venous systems from the level of the common femoral vein and including the common femoral, femoral, profunda femoral, popliteal and calf veins including the posterior tibial, peroneal and gastrocnemius veins when visible. The superficial great saphenous vein was also interrogated. Spectral Doppler was utilized to evaluate flow at rest and with distal augmentation maneuvers in the common femoral, femoral and popliteal veins. COMPARISON:  None. FINDINGS: RIGHT LOWER EXTREMITY Common Femoral Vein: No evidence of thrombus. Normal compressibility, respiratory phasicity and response to augmentation. Saphenofemoral Junction: No evidence of thrombus. Normal compressibility and flow on color Doppler imaging. Profunda Femoral Vein: No evidence of thrombus. Normal compressibility and flow on color Doppler imaging. Femoral Vein: No evidence of thrombus. Normal compressibility, respiratory phasicity and  response to augmentation. Popliteal Vein: No evidence of thrombus. Normal compressibility, respiratory phasicity and response to augmentation. Calf Veins: No evidence of thrombus. Normal compressibility and flow on color Doppler imaging. Superficial Great Saphenous Vein: No evidence of thrombus. Normal compressibility and flow on color Doppler imaging. Other Findings:  None. LEFT LOWER EXTREMITY Common Femoral Vein: No evidence of thrombus. Normal compressibility, respiratory phasicity and response to augmentation. Saphenofemoral Junction: No evidence of thrombus. Normal compressibility and flow on color Doppler imaging. Profunda Femoral Vein: No evidence of thrombus. Normal compressibility and flow on color Doppler imaging. Femoral Vein: No evidence of thrombus. Normal compressibility, respiratory phasicity and response to augmentation. Popliteal Vein: No evidence of thrombus. Normal compressibility, respiratory phasicity and response to augmentation. Calf Veins: No evidence of thrombus. Normal compressibility and flow on color Doppler imaging. Superficial Great Saphenous Vein: No evidence of thrombus. Normal compressibility and flow on color Doppler imaging. Other Findings:  None. IMPRESSION: Sonographic survey of the bilateral lower extremities negative for DVT Electronically Signed   By: Corrie Mckusick D.O.   On: 09/07/2019 11:19     Past medical hx Past Medical History:  Diagnosis Date  . Bruising   . Cervical radiculopathy   . Deficiency of vitamin B   . Depression   . Edema   . Flank pain   . Gross hematuria   .  HTN (hypertension)   . Iron deficiency anemia   . Leukopenia   . OA (osteoarthritis)   . Obesity   . OSA on CPAP   . Osteopenia   . Perennial allergic rhinitis   . Renal cysts, acquired, bilateral   . RLS (restless legs syndrome)   . Sickle cell trait ([place])   . Snoring   . Spinal stenosis      Social History   Tobacco Use  . Smoking status: Never Smoker  . Smokeless  tobacco: Never Used  Substance Use Topics  . Alcohol use: Not Currently    Alcohol/week: 0.0 standard drinks    Comment: occasionally  . Drug use: No    Ms.Abeyta reports that she has never smoked. She has never used smokeless tobacco. She reports previous alcohol use. She reports that she does not use drugs.  Tobacco Cessation: Never smoker   Past surgical hx, Family hx, Social hx all reviewed.  Current Outpatient Medications on File Prior to Visit  Medication Sig  . albuterol (PROVENTIL) (2.5 MG/3ML) 0.083% nebulizer solution Take 3 mLs (2.5 mg total) by nebulization every 6 (six) hours as needed for wheezing or shortness of breath.  Marland Kitchen albuterol (VENTOLIN HFA) 108 (90 Base) MCG/ACT inhaler Inhale 2 puffs into the lungs every 6 (six) hours as needed for wheezing or shortness of breath.  . ASHWAGANDHA PO Take by mouth.  Marland Kitchen aspirin 81 MG EC tablet TAKE 1 TABLET BY MOUTH EVERY DAY  . atorvastatin (LIPITOR) 40 MG tablet TAKE 1 TABLET BY MOUTH EVERY DAY  . Azelastine HCl 137 MCG/SPRAY SOLN Place 2 sprays into the nose daily. 2 sprays in each nostril daily  . benzonatate (TESSALON) 100 MG capsule Take 1-2 capsules (100-200 mg total) by mouth 2 (two) times daily as needed.  Marland Kitchen BLACK CURRANT SEED OIL PO Take by mouth.  . budesonide (PULMICORT) 0.5 MG/2ML nebulizer solution Take 2 mLs (0.5 mg total) by nebulization 2 (two) times daily.  . Cholecalciferol (VITAMIN D3 ADULT GUMMIES PO) Take 2 each by mouth daily.   . ferrous sulfate (IRON SUPPLEMENT) 325 (65 FE) MG tablet Take 325 mg by mouth daily with breakfast.  . fluticasone (FLONASE) 50 MCG/ACT nasal spray SPRAY 2 SPRAYS INTO EACH NOSTRIL EVERY DAY  . loratadine (CLARITIN) 10 MG tablet Take 1 tablet (10 mg total) by mouth daily.  Marland Kitchen losartan (COZAAR) 25 MG tablet TAKE 1 TABLET BY MOUTH EVERY DAY  . magnesium oxide (MAG-OX) 400 MG tablet Take 400 mg by mouth daily. Reported on 12/08/2015  . Multiple Vitamins-Minerals (CENTRUM SILVER) tablet Take  1 tablet by mouth daily.  Marland Kitchen omeprazole (PRILOSEC) 20 MG capsule TAKE 1 CAPSULE BY MOUTH EVERY DAY  . TURMERIC PO Take by mouth.   No current facility-administered medications on file prior to visit.     Allergies  Allergen Reactions  . Contrast Media [Iodinated Diagnostic Agents]   . Shellfish Allergy     Edema    Review Of Systems:  Constitutional:   No  weight loss, night sweats,  Fevers, chills, fatigue, or  lassitude.  HEENT:   No headaches,  Difficulty swallowing,  Tooth/dental problems, or  Sore throat,                No sneezing, itching, ear ache, nasal congestion, + post nasal drip,   CV:  Occasional  chest pain,  No Orthopnea, PND, + swelling in right  lower extremity,No  anasarca, dizziness, palpitations, syncope.   GI  No  heartburn, indigestion, abdominal pain, nausea, vomiting, diarrhea, change in bowel habits, loss of appetite, bloody stools.   Resp: +  shortness of breath with exertion less  at rest. Difficulty taking a deep breath,  No excess mucus, no productive cough,  + non-productive cough,  No coughing up of blood.  No change in color of mucus.  No wheezing.  No chest wall deformity  Skin: no rash or lesions.  GU: no dysuria, change in color of urine, no urgency or frequency.  No flank pain, no hematuria   MS:  No joint pain or swelling.  No decreased range of motion.  No back pain. + Right leg pain  Psych:  No change in mood or affect. + depression and  anxiety.  No memory loss.   Vital Signs BP 130/68 (BP Location: Left Arm, Patient Position: Sitting, Cuff Size: Large)   Pulse 95   Temp (!) 97.5 F (36.4 C) (Temporal)   Ht 6' (1.829 m)   Wt 229 lb (103.9 kg)   SpO2 97% Comment: on ra  BMI 31.06 kg/m    Physical Exam:  General- No distress,  A&Ox3, pleasant but tearful ENT: No sinus tenderness, TM clear, pale nasal mucosa, no oral exudate,no post nasal drip, no LAN Cardiac: S1, S2, regular rate and rhythm, no murmur Chest: No wheeze/ rales/  dullness; no accessory muscle use, no nasal flaring, no sternal retractions, ? Crackles per bases Abd.: Soft Non-tender, ND, BS +. Body mass index is 31.06 kg/m. Ext: No clubbing cyanosis, + edema RLE Neuro:  normal strength, MAE x 4, A&O x 3, appropriate  Skin: No rashes, warm and dry, no lesions Psych: Anxious and depressed >> appropriate as she recently lost her husband ( 06/2019)   Assessment/Plan  COVID-19 virus infection Now experiencing some post COVID exertional dyspnea Plan It is good to see you today. We will check a d dimer today If this is abnormal we will get a CT Angio to rule out other potential problems. If your D dimer is normal, we will schedule an HRCT. CBC today to evaluate for anemia Please get your COVID vaccine 10/12/2019 as is scheduled. We will place an order for Pulmonary Function Tests to evaluate your pulmonary status This may take awhile to get done as we are very backed up.  Keep using your NEBs twice daily for the shortness of breath We will order Bilateral Lower extremity dopplers today You will get a call to schedule these this week.  Follow up with Dr. Mortimer Fries in 2 months or sooner if diagnostics are + and need further follow up.    Localized swelling of right lower leg Post COVID 04/2019 Plan D-dimer CTA or HRCT based on results of d dimer Lower extremity dopplers Follow up in 2 months with Dr. Mortimer Fries Please contact office for sooner follow up if symptoms do not improve or worsen or seek emergency care   OSA on CPAP Great compliance  AHI is well controlled Plan Continue on CPAP at bedtime. You appear to be benefiting from the treatment  Goal is to wear for at least 6 hours each night for maximal clinical benefit. Continue to work on weight loss, as the link between excess weight  and sleep apnea is well established.   Remember to establish a good bedtime routine, and work on sleep hygiene.  Limit daytime naps , avoid stimulants such as caffeine  and nicotine close to bedtime, exercise daily to promote sleep quality, avoid heavy , spicy,  fried , or rich foods before bed. Ensure adequate exposure to natural light during the day,establish a relaxing bedtime routine with a pleasant sleep environment ( Bedroom between 60 and 67 degrees, turn off bright lights , TV or device screens screens , consider black out curtains or white noise machines) Do not drive if sleepy. Remember to clean mask, tubing, filter, and reservoir once weekly with soapy water.  Follow up with Dr. Mortimer Fries or NP   In 2 months  or before as needed.     Depression, major, in remission [facility]) Lost husband to COVID 19 06/2019 Plan Continue counseling as you have been doing.   I provided 50  minutes of care   Magdalen Spatz, NP 09/08/2019  9:34 AM  Addendum  Pt has had VTE work up for post Covid  dyspnea. She is negative for VTE. I discussed her with Dr. Valeta Harms, and we will treat her with a slow pred taper to see if her dyspnea is steroid responsive. Labs endorse a  slightly elevated d dimer, and CXR with notation of post covid scarring.  Suspect there could be an inflammatory component to her dyspnea. If she has no improvement we will follow up with a non-contrast HRCT in 1 month.    09/07/2019 CXR Ill-defined opacities in the right upper lobe toward the apex, stable. Question scarring from previous pneumonia in these areas. Lungs elsewhere clear except for slight bibasilar atelectasis. Cardiac silhouette is within normal limits. No adenopathy.  09/06/2019 Fibrin Derivatives D Dimer>> 499.68  CBC Latest Ref Rng & Units 09/06/2019 05/21/2019 05/04/2019  WBC 4.0 - 10.5 K/uL 4.6 4.8 3.4(L)  Hemoglobin 12.0 - 15.0 g/dL 12.2 12.0 12.6  Hematocrit 36.0 - 46.0 % 37.1 37.1 39.6  Platelets 150 - 400 K/uL 252 200 227    09/07/2019 VQ Scan IMPRESSION: No appreciable perfusion defects. Very low probability of pulmonary Embolus  09/07/2019 Bilateral Lower Extremity  Dopplers IMPRESSION: Sonographic survey of the bilateral lower extremities negative for DVT   Additional  Time evaluating labs and diagnostics 30 minutes  Magdalen Spatz, MSN, AGACNP-BC [place] Pager # [phone] After 4 pm please call [phone] 09/08/2019 10:15 AM

## 2019-09-06 NOTE — Assessment & Plan Note (Signed)
Lost husband to COVID 19 06/2019 Plan Continue counseling as you have been doing.

## 2019-09-07 ENCOUNTER — Other Ambulatory Visit: Payer: Self-pay

## 2019-09-07 ENCOUNTER — Ambulatory Visit
Admission: RE | Admit: 2019-09-07 | Discharge: 2019-09-07 | Disposition: A | Discharge status: Home / Self Care | Payer: Medicare Other | Source: Ambulatory Visit | Attending: Acute Care | Admitting: Acute Care

## 2019-09-07 ENCOUNTER — Telehealth: Payer: Self-pay | Admitting: Acute Care

## 2019-09-07 DIAGNOSIS — R2241 Localized swelling, mass and lump, right lower limb: Secondary | ICD-10-CM

## 2019-09-07 DIAGNOSIS — R079 Chest pain, unspecified: Secondary | ICD-10-CM | POA: Diagnosis not present

## 2019-09-07 DIAGNOSIS — R7989 Other specified abnormal findings of blood chemistry: Secondary | ICD-10-CM

## 2019-09-07 DIAGNOSIS — R05 Cough: Secondary | ICD-10-CM | POA: Diagnosis not present

## 2019-09-07 DIAGNOSIS — M7989 Other specified soft tissue disorders: Secondary | ICD-10-CM | POA: Diagnosis not present

## 2019-09-07 DIAGNOSIS — M79604 Pain in right leg: Secondary | ICD-10-CM | POA: Diagnosis not present

## 2019-09-07 DIAGNOSIS — R0602 Shortness of breath: Secondary | ICD-10-CM | POA: Diagnosis not present

## 2019-09-07 MED ORDER — PREDNISONE 10 MG PO TABS: ORAL_TABLET | ORAL | 0 refills | Status: DC

## 2019-09-07 MED ORDER — TECHNETIUM TO 99M ALBUMIN AGGREGATED
4.0000 | Freq: Once | INTRAVENOUS | Status: AC | PRN
  Administered 2019-09-07: 4.23 via INTRAVENOUS

## 2019-09-07 NOTE — Telephone Encounter (Signed)
Per Eric Form, NP- VQ showed low probability of PE. Recommend seeing her back in 44mo vs 68mo as discussed at yesterday's visit. Recommend getting out and walking, as there may be a deconditioning element to her sob.  Pt is aware and voiced her understanding.  Pt has been scheduled for OV on 10/01/2019 with Dr. Mortimer Fries.  Nothing further is needed.

## 2019-09-07 NOTE — Addendum Note (Signed)
Addended by: Maryanna Shape A on: 09/07/2019 08:13 AM   Modules accepted: Orders

## 2019-09-07 NOTE — Telephone Encounter (Addendum)
Received call report from [place] with [place]. Doppler was negative.  Eric Form, NP has been given results. Sarah requested that I make pt aware of results.  Lm to make pt aware of doppler results.

## 2019-09-07 NOTE — Telephone Encounter (Signed)
Pt is aware of below results and voiced her understanding.

## 2019-09-07 NOTE — Telephone Encounter (Signed)
Per Eric Form, NP verbally- order VQ scan with CXR prior due to slightly evaluated d-dimer.   Pt is aware and voiced her understanding.  CXR and VQ has been ordered. Nothing further is needed.

## 2019-09-07 NOTE — Addendum Note (Signed)
Addended by: Maryanna Shape A on: 09/07/2019 09:50 AM   Modules accepted: Orders

## 2019-09-09 ENCOUNTER — Other Ambulatory Visit: Payer: Self-pay | Admitting: Family Medicine

## 2019-09-09 DIAGNOSIS — I10 Essential (primary) hypertension: Secondary | ICD-10-CM

## 2019-09-10 ENCOUNTER — Ambulatory Visit
Admission: RE | Admit: 2019-09-10 | Discharge: 2019-09-10 | Disposition: A | Discharge status: Home / Self Care | Payer: Medicare Other | Source: Ambulatory Visit | Attending: Family Medicine | Admitting: Family Medicine

## 2019-09-10 DIAGNOSIS — M5416 Radiculopathy, lumbar region: Secondary | ICD-10-CM | POA: Diagnosis not present

## 2019-09-10 DIAGNOSIS — M5412 Radiculopathy, cervical region: Secondary | ICD-10-CM | POA: Diagnosis not present

## 2019-09-10 DIAGNOSIS — M5030 Other cervical disc degeneration, unspecified cervical region: Secondary | ICD-10-CM | POA: Diagnosis not present

## 2019-09-10 DIAGNOSIS — M48062 Spinal stenosis, lumbar region with neurogenic claudication: Secondary | ICD-10-CM | POA: Diagnosis not present

## 2019-09-10 DIAGNOSIS — M5136 Other intervertebral disc degeneration, lumbar region: Secondary | ICD-10-CM | POA: Diagnosis not present

## 2019-09-10 DIAGNOSIS — Z1231 Encounter for screening mammogram for malignant neoplasm of breast: Secondary | ICD-10-CM | POA: Diagnosis not present

## 2019-09-10 DIAGNOSIS — M47816 Spondylosis without myelopathy or radiculopathy, lumbar region: Secondary | ICD-10-CM | POA: Diagnosis not present

## 2019-09-14 DIAGNOSIS — F411 Generalized anxiety disorder: Secondary | ICD-10-CM | POA: Diagnosis not present

## 2019-09-17 ENCOUNTER — Telehealth: Payer: Self-pay

## 2019-09-17 NOTE — Telephone Encounter (Signed)
Copied from [place] [id number]. Topic: Referral - Request for Referral >> Sep 16, 2019  3:19 PM Virl Axe D wrote: Has patient seen PCP for this complaint? Yes *If NO, is insurance requiring patient see PCP for this issue before PCP can refer them? Referral for which specialty: Urologist Preferred provider/office: Pt stated Dr. Ancil Boozer referred her to a urologist a couple of years ago. She would like to go back to that same office. Did not have name. Reason for referral: Lower stomach pain

## 2019-09-19 ENCOUNTER — Other Ambulatory Visit: Payer: Self-pay | Admitting: Acute Care

## 2019-09-21 ENCOUNTER — Ambulatory Visit (INDEPENDENT_AMBULATORY_CARE_PROVIDER_SITE_OTHER): Payer: Medicare Other | Admitting: Family Medicine

## 2019-09-21 ENCOUNTER — Other Ambulatory Visit: Payer: Self-pay

## 2019-09-21 ENCOUNTER — Encounter: Payer: Self-pay | Admitting: Family Medicine

## 2019-09-21 VITALS — BP 126/80 | HR 97 | Temp 96.8°F | Resp 16 | Ht 72.0 in | Wt 227.5 lb

## 2019-09-21 DIAGNOSIS — N281 Cyst of kidney, acquired: Secondary | ICD-10-CM | POA: Diagnosis not present

## 2019-09-21 DIAGNOSIS — K5904 Chronic idiopathic constipation: Secondary | ICD-10-CM | POA: Diagnosis not present

## 2019-09-21 DIAGNOSIS — R1031 Right lower quadrant pain: Secondary | ICD-10-CM | POA: Diagnosis not present

## 2019-09-21 DIAGNOSIS — Z8616 Personal history of COVID-19: Secondary | ICD-10-CM | POA: Diagnosis not present

## 2019-09-21 MED ORDER — LUBIPROSTONE 24 MCG PO CAPS: 24.0000 ug | ORAL_CAPSULE | Freq: Two times a day (BID) | ORAL | 0 refills | Status: DC

## 2019-09-21 NOTE — Progress Notes (Signed)
Name: Melissa [name]   MRN: [id number]    DOB: 06-05-1951   Date:09/21/2019       Progress Note  Subjective  Chief Complaint  Chief Complaint  Patient presents with  . Referral  . Abdominal Pain    Had for some time and seen back doctor and they don't feel like this is pain radiating from her back to her abdomen area  . Gastroesophageal Reflux    Increase in acid reflux even when she first wakes up in the morning.    HPI  RUQ : she states she has a history of kidney stones, but this pain is different. She states this pain has happened before and has seen Urologist - work up was negative at the time ( CT Abdomen /US kidneys in 2015 ). She also has chronic lower back pain , history of DDD - sees Dr. Phyllis Ginger. She has noticed a change  in bowel movements. She described the pain as aching it is present almost the entire day. Not aggravated by movement, no changes after bowel movements. She denies dysuria, hematuria or urinary frequency, nocturia is stable about twice per night   Chronic Constipation:  she states she failed miralax, otc medication including metamucil. She was on Linzess but stopped because she was doing well on prunes and increase in fever, over the past 8 weeks prunes has not been working, she can go days without a bowel movement. Yesterday she had a small bowel movement after taking 3 stool softness the night before .  Renal cysts: had CT done 2018, [place] by Dr. Abigail Butts shortly after discussed today MRI , she states she will discuss it with Dr. Abigail Butts during upcoming appointment   History of  COVID-19: going on for months, she is currently taking prednisone taper for2 weeks and has one more week to go, pleuritic chest pain.   Patient Active Problem List   Diagnosis Date Noted  . COVID-19 virus infection 09/06/2019  . Localized swelling of right lower leg 09/06/2019  . OSA on CPAP 09/06/2019  . Secondary hyperparathyroidism of renal origin ([place]) 07/21/2019  . Anemia in  chronic kidney disease 06/14/2019  . Benign hypertensive kidney disease with chronic kidney disease 06/14/2019  . Sickle cell trait ([place]) 06/14/2019  . Chronic kidney disease, stage 3b 06/14/2019  . Chronic bronchitis ([place]) 05/19/2019  . Chronic venous insufficiency 04/15/2019  . Lymphedema 04/15/2019  . Swelling of limb 03/30/2019  . Family history of colon cancer   . Trigeminal neuralgia 09/11/2018  . Cardiac LV ejection fraction of 40-49% 05/07/2017  . Low back pain without sciatica 02/21/2017  . Renal cyst 10/18/2016  . Atherosclerosis of aorta ([place]) 10/16/2016  . History of colonic polyps 09/25/2016  . Dyslipidemia 12/08/2015  . Gastroesophageal reflux disease without esophagitis 08/11/2015  . Primary osteoarthritis of knee 06/13/2015  . Hip bursitis 06/13/2015  . RLS (restless legs syndrome) 06/13/2015  . Depression, major, in remission ([place]) 06/13/2015  . B12 deficiency 06/13/2015  . History of Roux-en-Y gastric bypass 06/13/2015  . History of iron deficiency anemia 06/13/2015  . Vitamin D deficiency 06/13/2015  . Hypertension, benign 06/13/2015  . Perennial allergic rhinitis with seasonal variation 06/13/2015  . Obesity (BMI 30.0-34.9) 06/13/2015  . Cervical radiculitis 01/04/2014  . Cervical spinal stenosis 01/04/2014  . Cervical osteoarthritis 01/04/2014    Past Surgical History:  Procedure Laterality Date  . BARIATRIC SURGERY    . CESAREAN SECTION     3 or more  . COLONOSCOPY WITH  PROPOFOL N/A 03/15/2019   Procedure: COLONOSCOPY WITH PROPOFOL;  Surgeon: [name], MD;  Location: [facility] ENDOSCOPY;  Service: Gastroenterology;  Laterality: N/A;  . GASTRIC BYPASS    . TUBAL LIGATION      Family History  Problem Relation Age of Onset  . Hypercholesterolemia Mother   . Heart disease Mother   . Hypertension Mother   . Alcohol abuse Father   . Lung cancer Brother   . Alcohol abuse Brother   . Diabetes Mellitus II Sister   . Hypertension Maternal  Grandmother   . Colon cancer Sister   . Breast cancer Neg Hx      Current Outpatient Medications:  .  albuterol (PROVENTIL) (2.5 MG/3ML) 0.083% nebulizer solution, Take 3 mLs (2.5 mg total) by nebulization every 6 (six) hours as needed for wheezing or shortness of breath., Disp: 75 mL, Rfl: 0 .  albuterol (VENTOLIN HFA) 108 (90 Base) MCG/ACT inhaler, Inhale 2 puffs into the lungs every 6 (six) hours as needed for wheezing or shortness of breath., Disp: 18 g, Rfl: 0 .  ASHWAGANDHA PO, Take by mouth., Disp: , Rfl:  .  aspirin 81 MG EC tablet, TAKE 1 TABLET BY MOUTH EVERY DAY, Disp: 30 tablet, Rfl: 0 .  atorvastatin (LIPITOR) 40 MG tablet, TAKE 1 TABLET BY MOUTH EVERY DAY, Disp: 90 tablet, Rfl: 1 .  Azelastine HCl 137 MCG/SPRAY SOLN, Place 2 sprays into the nose daily. 2 sprays in each nostril daily, Disp: 1 Bottle, Rfl: 5 .  BLACK CURRANT SEED OIL PO, Take by mouth., Disp: , Rfl:  .  budesonide (PULMICORT) 0.5 MG/2ML nebulizer solution, Take 2 mLs (0.5 mg total) by nebulization 2 (two) times daily., Disp: 120 mL, Rfl: 0 .  Cholecalciferol (VITAMIN D3 ADULT GUMMIES PO), Take 2 each by mouth daily. , Disp: , Rfl:  .  ferrous sulfate (IRON SUPPLEMENT) 325 (65 FE) MG tablet, Take 325 mg by mouth daily with breakfast., Disp: , Rfl:  .  fluticasone (FLONASE) 50 MCG/ACT nasal spray, SPRAY 2 SPRAYS INTO EACH NOSTRIL EVERY DAY, Disp: 16 g, Rfl: 3 .  loratadine (CLARITIN) 10 MG tablet, Take 1 tablet (10 mg total) by mouth daily., Disp: 90 tablet, Rfl: 0 .  losartan (COZAAR) 25 MG tablet, TAKE 1 TABLET BY MOUTH EVERY DAY, Disp: 90 tablet, Rfl: 0 .  magnesium oxide (MAG-OX) 400 MG tablet, Take 400 mg by mouth daily. Reported on 12/08/2015, Disp: , Rfl:  .  Multiple Vitamins-Minerals (CENTRUM SILVER) tablet, Take 1 tablet by mouth daily., Disp: 30 tablet, Rfl: 0 .  omeprazole (PRILOSEC) 20 MG capsule, TAKE 1 CAPSULE BY MOUTH EVERY DAY, Disp: 90 capsule, Rfl: 3 .  predniSONE (DELTASONE) 10 MG tablet, 3 tabs x7  days with breakfast, 2 tabs x7 days, 1 tab x 7 days then stop, Disp: 42 tablet, Rfl: 0 .  TURMERIC PO, Take by mouth., Disp: , Rfl:  .  benzonatate (TESSALON) 100 MG capsule, Take 1-2 capsules (100-200 mg total) by mouth 2 (two) times daily as needed. (Patient not taking: Reported on 09/21/2019), Disp: 60 capsule, Rfl: 0 .  gabapentin (NEURONTIN) 300 MG capsule, TAKE 1 CAPSULE BY MOUTH TWICE A DAY FOR 4 DAYS THEN 3 TIMES A DAY, Disp: , Rfl:  .  metaxalone (SKELAXIN) 800 MG tablet, Take 400-800 mg by mouth 2 (two) times daily as needed., Disp: , Rfl:   Allergies  Allergen Reactions  . Contrast Media [Iodinated Diagnostic Agents]   . Shellfish Allergy  Edema    I personally reviewed active problem list, medication list, allergies, family history, social history, health maintenance with the patient/caregiver today.   ROS  Constitutional: Negative for fever or weight change.  Respiratory: Negative for cough and shortness of breath.   Cardiovascular: Negative for chest pain or palpitations.  Gastrointestinal: positive  for abdominal pain, no bowel changes.  Musculoskeletal: Negative for gait problem or joint swelling.  Skin: Negative for rash.  Neurological: Negative for dizziness or headache.  No other specific complaints in a complete review of systems (except as listed in HPI above).  Objective  Vitals:   09/21/19 1332  BP: 126/80  Pulse: 97  Resp: 16  Temp: (!) 96.8 F (36 C)  TempSrc: Temporal  SpO2: 97%  Weight: 227 lb 8 oz (103.2 kg)  Height: 6' (1.829 m)    Body mass index is 30.85 kg/m.  Physical Exam  Constitutional: Patient appears well-developed and well-nourished. Obese No distress.  HEENT: head atraumatic, normocephalic, pupils equal and reactive to light Cardiovascular: Normal rate, regular rhythm and normal heart sounds.  No murmur heard. No BLE edema. Pulmonary/Chest: Effort normal and breath sounds normal. No respiratory distress. Abdominal: Soft.   There is no tenderness, but when present it is on RLQ Psychiatric: Patient has a normal mood and affect. behavior is normal. Judgment and thought content normal.  Recent Results (from the past 2160 hour(s))  Hemoglobin A1c     Status: None   Collection Time: 07/29/19 12:00 AM  Result Value Ref Range   Hgb A1c MFr Bld 5.5 <5.7 % of total Hgb    Comment: For the purpose of screening for the presence of diabetes: . <5.7%       Consistent with the absence of diabetes 5.7-6.4%    Consistent with increased risk for diabetes             (prediabetes) > or =6.5%  Consistent with diabetes . This assay result is consistent with a decreased risk of diabetes. . Currently, no consensus exists regarding use of hemoglobin A1c for diagnosis of diabetes in children. . According to American Diabetes Association (ADA) guidelines, hemoglobin A1c <7.0% represents optimal control in non-pregnant diabetic patients. Different metrics may apply to specific patient populations.  Standards of Medical Care in Diabetes(ADA). .    Mean Plasma Glucose 111 (calc)   eAG (mmol/L) 6.2 (calc)  Lipid panel     Status: Abnormal   Collection Time: 07/29/19 12:00 AM  Result Value Ref Range   Cholesterol 220 (H) <200 mg/dL   HDL 102 > OR = 50 mg/dL   Triglycerides 72 <150 mg/dL   LDL Cholesterol (Calc) 102 (H) mg/dL (calc)    Comment: Reference range: <100 . Desirable range <100 mg/dL for primary prevention;   <70 mg/dL for patients with CHD or diabetic patients  with > or = 2 CHD risk factors. Marland Kitchen LDL-C is now calculated using the Martin-Hopkins  calculation, which is a validated novel method providing  better accuracy than the Friedewald equation in the  estimation of LDL-C.  Cresenciano Genre et al. Annamaria Helling. [id number]): 2061-2068  (http://education.[facility].com/faq/FAQ164)    Total CHOL/HDL Ratio 2.2 <5.0 (calc)   Non-HDL Cholesterol (Calc) 118 <130 mg/dL (calc)    Comment: For patients with diabetes  plus 1 major ASCVD risk  factor, treating to a non-HDL-C goal of <100 mg/dL  (LDL-C of <70 mg/dL) is considered a therapeutic  option.   VITAMIN D 25 Hydroxy (Vit-D Deficiency, Fractures)  Status: Abnormal   Collection Time: 07/29/19 12:00 AM  Result Value Ref Range   Vit D, 25-Hydroxy 26 (L) 30 - 100 ng/mL    Comment: Vitamin D Status         25-OH Vitamin D: . Deficiency:                    <20 ng/mL Insufficiency:             20 - 29 ng/mL Optimal:                 > or = 30 ng/mL . For 25-OH Vitamin D testing on patients on  D2-supplementation and patients for whom quantitation  of D2 and D3 fractions is required, the QuestAssureD(TM) 25-OH VIT D, (D2,D3), LC/MS/MS is recommended: order  code [phone] (patients >46yrs). See Note 1 . Note 1 . For additional information, please refer to  http://education.[facility].com/faq/FAQ199  (This link is being provided for informational/ educational purposes only.)   CBC with Differential/Platelet     Status: None   Collection Time: 09/06/19  4:40 PM  Result Value Ref Range   WBC 4.6 4.0 - 10.5 K/uL   RBC 4.08 3.87 - 5.11 MIL/uL   Hemoglobin 12.2 12.0 - 15.0 g/dL   HCT 37.1 36.0 - 46.0 %   MCV 90.9 80.0 - 100.0 fL   MCH 29.9 26.0 - 34.0 pg   MCHC 32.9 30.0 - 36.0 g/dL   RDW 12.1 11.5 - 15.5 %   Platelets 252 150 - 400 K/uL   nRBC 0.0 0.0 - 0.2 %   Neutrophils Relative % 41 %   Neutro Abs 1.9 1.7 - 7.7 K/uL   Lymphocytes Relative 43 %   Lymphs Abs 1.9 0.7 - 4.0 K/uL   Monocytes Relative 12 %   Monocytes Absolute 0.6 0.1 - 1.0 K/uL   Eosinophils Relative 3 %   Eosinophils Absolute 0.1 0.0 - 0.5 K/uL   Basophils Relative 1 %   Basophils Absolute 0.1 0.0 - 0.1 K/uL   Immature Granulocytes 0 %   Abs Immature Granulocytes 0.00 0.00 - 0.07 K/uL    Comment: Performed at [facility], [address] [place], [place] [address]  Fibrin derivatives D-[facility] only)     Status: Abnormal   Collection Time: 09/06/19   4:40 PM  Result Value Ref Range   Fibrin derivatives D-dimer ([facility]) 499.68 (H) 0.00 - 499.00 ng/mL (FEU)    Comment: (NOTE) <> Exclusion of Venous Thromboembolism (VTE) - OUTPATIENT ONLY   (Emergency Department or [place])   0-499 ng/ml (FEU): With a low to intermediate pretest probability                      for VTE this test result excludes the diagnosis                      of VTE.   >499 ng/ml (FEU) : VTE not excluded; additional work up for VTE is                      required. <> Testing on Inpatients and Evaluation of Disseminated Intravascular   Coagulation (DIC) Reference Range:   0-499 ng/ml (FEU) Performed at [facility], [address] [place], [place] [address]      PHQ2/9: Depression screen [facility] 2/9 09/21/2019 07/21/2019 05/19/2019 05/03/2019 03/18/2019  Decreased Interest 0 0 0 0 0  Down, Depressed,  Hopeless 0 0 0 0 0  PHQ - 2 Score 0 0 0 0 0  Altered sleeping 0 1 0 0 0  Tired, decreased energy 1 0 0 0 0  Change in appetite 0 0 0 0 0  Feeling bad or failure about yourself  0 0 0 0 0  Trouble concentrating 0 0 0 0 0  Moving slowly or fidgety/restless 0 0 0 0 0  Suicidal thoughts 0 0 0 0 0  PHQ-9 Score 1 1 0 0 0  Difficult doing work/chores Not difficult at all Not difficult at all - Not difficult at all Not difficult at all  Some recent data might be hidden    phq 9 is negative   Fall Risk: Fall Risk  09/21/2019 07/21/2019 05/19/2019 05/03/2019 03/18/2019  Falls in the past year? 1 0 0 0 0  Comment August 2020 - - - -  Number falls in past yr: 0 0 0 0 0  Injury with Fall? 1 0 0 0 0  Comment Fractured pinky - - - -  Follow up - - - - Falls evaluation completed    Functional Status Survey: Is the patient deaf or have difficulty hearing?: No Does the patient have difficulty seeing, even when wearing glasses/contacts?: Yes Does the patient have difficulty concentrating, remembering, or making decisions?: No Does the patient have difficulty walking or  climbing stairs?: No Does the patient have difficulty dressing or bathing?: No Does the patient have difficulty doing errands alone such as visiting a doctor's office or shopping?: No    Assessment & Plan  1. Right lower quadrant pain  - CULTURE, URINE COMPREHENSIVE  2. Chronic idiopathic constipation  - lubiprostone (AMITIZA) 24 MCG capsule; Take 1 capsule (24 mcg total) by mouth 2 (two) times daily with a meal.  Dispense: 180 capsule; Refill: 0  3. Renal cyst  She will discuss further test with Dr. Abigail Butts   4. History of COVID-19  Keep follow up with Dr. Mortimer Fries

## 2019-09-23 ENCOUNTER — Other Ambulatory Visit: Payer: Self-pay

## 2019-09-23 ENCOUNTER — Telehealth: Payer: Self-pay | Admitting: Family Medicine

## 2019-09-23 ENCOUNTER — Encounter: Payer: Self-pay | Admitting: Family Medicine

## 2019-09-23 ENCOUNTER — Ambulatory Visit (INDEPENDENT_AMBULATORY_CARE_PROVIDER_SITE_OTHER): Payer: Medicare Other | Admitting: Family Medicine

## 2019-09-23 DIAGNOSIS — G500 Trigeminal neuralgia: Secondary | ICD-10-CM | POA: Diagnosis not present

## 2019-09-23 LAB — CULTURE, URINE COMPREHENSIVE
MICRO NUMBER:: 10108253
SPECIMEN QUALITY:: ADEQUATE

## 2019-09-23 MED ORDER — CARBAMAZEPINE 200 MG PO TABS: 200.0000 mg | ORAL_TABLET | Freq: Three times a day (TID) | ORAL | 0 refills | Status: DC

## 2019-09-23 MED ORDER — ACETAMINOPHEN-CODEINE #3 300-30 MG PO TABS: 1.0000 | ORAL_TABLET | ORAL | 0 refills | Status: DC | PRN

## 2019-09-23 NOTE — Telephone Encounter (Signed)
Copied from [place] [id number]. Topic: General - Other >> Sep 23, 2019  9:45 AM Keene Breath wrote: Reason for CRM: Patient called to request some pain medication before her appt. Tomorrow.  Please advise and let patient know if she can get the medication.  CB# [phone]

## 2019-09-23 NOTE — Progress Notes (Signed)
Name: Melissa [name]   MRN: [id number]    DOB: Nov 11, 1950   Date:09/23/2019       Progress Note  Subjective  Chief Complaint  Chief Complaint  Patient presents with  . Facial Swelling    Left side of her face started swelling yesterday and painful around her eye.  . Facial Pain    I connected with  [name]  on 09/23/19 at 11:40 AM EST by a video enabled telemedicine application and verified that I am speaking with the correct person using two identifiers.  I discussed the limitations of evaluation and management by telemedicine and the availability of in person appointments. The patient expressed understanding and agreed to proceed. Staff also discussed with the patient that there may be a patient responsible charge related to this service. Patient Location: at home  Provider Location: [facility]   HPI  Left jaw pain: she states she developed a sudden onset of left side facial pain, worse in front of left ear, radiates to temporal area, radiates to neck and also her teeth is very sore. Difficulty opening her mouth and daughter noticed that left side of face seems a little swollen There is no redness or rashes. She feels warm but has not checked her temperature. States mouth is not dry. This pain different than trigeminal neuralgia because it is more constant instead of intermittent.    Patient Active Problem List   Diagnosis Date Noted  . COVID-19 virus infection 09/06/2019  . Localized swelling of right lower leg 09/06/2019  . OSA on CPAP 09/06/2019  . Secondary hyperparathyroidism of renal origin ([place]) 07/21/2019  . Anemia in chronic kidney disease 06/14/2019  . Benign hypertensive kidney disease with chronic kidney disease 06/14/2019  . Sickle cell trait ([place]) 06/14/2019  . Chronic kidney disease, stage 3b 06/14/2019  . Chronic bronchitis ([place]) 05/19/2019  . Chronic venous insufficiency 04/15/2019  . Lymphedema 04/15/2019  . Swelling of limb  03/30/2019  . Family history of colon cancer   . Trigeminal neuralgia 09/11/2018  . Cardiac LV ejection fraction of 40-49% 05/07/2017  . Low back pain without sciatica 02/21/2017  . Renal cyst 10/18/2016  . Atherosclerosis of aorta ([place]) 10/16/2016  . History of colonic polyps 09/25/2016  . Dyslipidemia 12/08/2015  . Gastroesophageal reflux disease without esophagitis 08/11/2015  . Primary osteoarthritis of knee 06/13/2015  . Hip bursitis 06/13/2015  . RLS (restless legs syndrome) 06/13/2015  . Depression, major, in remission ([place]) 06/13/2015  . B12 deficiency 06/13/2015  . History of Roux-en-Y gastric bypass 06/13/2015  . History of iron deficiency anemia 06/13/2015  . Vitamin D deficiency 06/13/2015  . Hypertension, benign 06/13/2015  . Perennial allergic rhinitis with seasonal variation 06/13/2015  . Obesity (BMI 30.0-34.9) 06/13/2015  . Cervical radiculitis 01/04/2014  . Cervical spinal stenosis 01/04/2014  . Cervical osteoarthritis 01/04/2014    Past Surgical History:  Procedure Laterality Date  . BARIATRIC SURGERY    . CESAREAN SECTION     3 or more  . COLONOSCOPY WITH PROPOFOL N/A 03/15/2019   Procedure: COLONOSCOPY WITH PROPOFOL;  Surgeon: [name], MD;  Location[facility] ENDOSCOPY;  Service: Gastroenterology;  Laterality: N/A;  . GASTRIC BYPASS    . TUBAL LIGATION      Family History  Problem Relation Age of Onset  . Hypercholesterolemia Mother   . Heart disease Mother   . Hypertension Mother   . Alcohol abuse Father   . Lung cancer Brother   . Alcohol  abuse Brother   . Diabetes Mellitus II Sister   . Hypertension Maternal Grandmother   . Colon cancer Sister   . Breast cancer Neg Hx       Current Outpatient Medications:  .  albuterol (PROVENTIL) (2.5 MG/3ML) 0.083% nebulizer solution, Take 3 mLs (2.5 mg total) by nebulization every 6 (six) hours as needed for wheezing or shortness of breath., Disp: 75 mL, Rfl: 0 .  albuterol (VENTOLIN HFA) 108  (90 Base) MCG/ACT inhaler, Inhale 2 puffs into the lungs every 6 (six) hours as needed for wheezing or shortness of breath., Disp: 18 g, Rfl: 0 .  ASHWAGANDHA PO, Take by mouth., Disp: , Rfl:  .  aspirin 81 MG EC tablet, TAKE 1 TABLET BY MOUTH EVERY DAY, Disp: 30 tablet, Rfl: 0 .  atorvastatin (LIPITOR) 40 MG tablet, TAKE 1 TABLET BY MOUTH EVERY DAY, Disp: 90 tablet, Rfl: 1 .  Azelastine HCl 137 MCG/SPRAY SOLN, Place 2 sprays into the nose daily. 2 sprays in each nostril daily, Disp: 1 Bottle, Rfl: 5 .  BLACK CURRANT SEED OIL PO, Take by mouth., Disp: , Rfl:  .  budesonide (PULMICORT) 0.5 MG/2ML nebulizer solution, Take 2 mLs (0.5 mg total) by nebulization 2 (two) times daily., Disp: 120 mL, Rfl: 0 .  Cholecalciferol (VITAMIN D3 ADULT GUMMIES PO), Take 2 each by mouth daily. , Disp: , Rfl:  .  ferrous sulfate (IRON SUPPLEMENT) 325 (65 FE) MG tablet, Take 325 mg by mouth daily with breakfast., Disp: , Rfl:  .  fluticasone (FLONASE) 50 MCG/ACT nasal spray, SPRAY 2 SPRAYS INTO EACH NOSTRIL EVERY DAY, Disp: 16 g, Rfl: 3 .  gabapentin (NEURONTIN) 300 MG capsule, TAKE 1 CAPSULE BY MOUTH TWICE A DAY FOR 4 DAYS THEN 3 TIMES A DAY, Disp: , Rfl:  .  loratadine (CLARITIN) 10 MG tablet, Take 1 tablet (10 mg total) by mouth daily., Disp: 90 tablet, Rfl: 0 .  losartan (COZAAR) 25 MG tablet, TAKE 1 TABLET BY MOUTH EVERY DAY, Disp: 90 tablet, Rfl: 0 .  lubiprostone (AMITIZA) 24 MCG capsule, Take 1 capsule (24 mcg total) by mouth 2 (two) times daily with a meal., Disp: 180 capsule, Rfl: 0 .  magnesium oxide (MAG-OX) 400 MG tablet, Take 400 mg by mouth daily. Reported on 12/08/2015, Disp: , Rfl:  .  metaxalone (SKELAXIN) 800 MG tablet, Take 400-800 mg by mouth 2 (two) times daily as needed., Disp: , Rfl:  .  Multiple Vitamins-Minerals (CENTRUM SILVER) tablet, Take 1 tablet by mouth daily., Disp: 30 tablet, Rfl: 0 .  omeprazole (PRILOSEC) 20 MG capsule, TAKE 1 CAPSULE BY MOUTH EVERY DAY, Disp: 90 capsule, Rfl: 3 .   predniSONE (DELTASONE) 10 MG tablet, 3 tabs x7 days with breakfast, 2 tabs x7 days, 1 tab x 7 days then stop, Disp: 42 tablet, Rfl: 0 .  TURMERIC PO, Take by mouth., Disp: , Rfl:   Allergies  Allergen Reactions  . Contrast Media [Iodinated Diagnostic Agents]   . Shellfish Allergy     Edema    I personally reviewed active problem list, medication list, allergies, family history, social history with the patient/caregiver today.   ROS  Ten systems reviewed and is negative except as mentioned in HPI   Objective  Virtual encounter, vitals not obtained.  There is no height or weight on file to calculate BMI.  Physical Exam  Awake, alert and oriented Seems to be in a lot of pain, no facial redness, cranial nerves intact, seems to have  mild swelling over left parotid gland ?  Results for orders placed or performed in visit on 09/21/19 (from the past 72 hour(s))  CULTURE, URINE COMPREHENSIVE     Status: None (Preliminary result)   Collection Time: 09/21/19  2:33 PM   Specimen: Urine  Result Value Ref Range   MICRO NUMBER: [id number]    SPECIMEN QUALITY: Adequate    Source OTHER (SPECIFY)    STATUS: PRELIMINARY    RESULT: Culture in progress     PHQ2/9: Depression screen [facility] 2/9 09/23/2019 09/21/2019 07/21/2019 05/19/2019 05/03/2019  Decreased Interest 0 0 0 0 0  Down, Depressed, Hopeless 0 0 0 0 0  PHQ - 2 Score 0 0 0 0 0  Altered sleeping 0 0 1 0 0  Tired, decreased energy 1 1 0 0 0  Change in appetite 0 0 0 0 0  Feeling bad or failure about yourself  0 0 0 0 0  Trouble concentrating 0 0 0 0 0  Moving slowly or fidgety/restless 0 0 0 0 0  Suicidal thoughts 0 0 0 0 0  PHQ-9 Score 1 1 1  0 0  Difficult doing work/chores Not difficult at all Not difficult at all Not difficult at all - Not difficult at all  Some recent data might be hidden   PHQ-2/9 Result is negative.    Fall Risk: Fall Risk  09/23/2019 09/21/2019 07/21/2019 05/19/2019 05/03/2019  Falls in the past year? 1 1 0 0 0    Comment - August 2020 - - -  Number falls in past yr: 0 0 0 0 0  Injury with Fall? 1 1 0 0 0  Comment - Fractured pinky - - -  Follow up - - - - -    Assessment & Plan  1. Trigeminal neuralgia of left side of face  - carbamazepine (TEGRETOL) 200 MG tablet; Take 1 tablet (200 mg total) by mouth 3 (three) times daily.  Dispense: 90 tablet; Refill: 0 - acetaminophen-codeine (TYLENOL #3) 300-30 MG tablet; Take 1-2 tablets by mouth every 4 (four) hours as needed for moderate pain.  Dispense: 30 tablet; Refill: 0  Atypical symptoms with swelling, advised to try medication but if no improvement to go to [facility]. She will also try to contact her neurologist who she has seen for trigeminal neuralgia   I discussed the assessment and treatment plan with the patient. The patient was provided an opportunity to ask questions and all were answered. The patient agreed with the plan and demonstrated an understanding of the instructions.  The patient was advised to call back or seek an in-person evaluation if the symptoms worsen or if the condition fails to improve as anticipated.  I provided 15  minutes of non-face-to-face time during this encounter.

## 2019-09-23 NOTE — Telephone Encounter (Signed)
Pt is scheduled for a virtual today

## 2019-09-24 ENCOUNTER — Encounter: Payer: Self-pay | Admitting: Family Medicine

## 2019-09-24 DIAGNOSIS — G500 Trigeminal neuralgia: Secondary | ICD-10-CM | POA: Diagnosis not present

## 2019-09-26 ENCOUNTER — Ambulatory Visit: Payer: Medicare Other

## 2019-09-28 DIAGNOSIS — F411 Generalized anxiety disorder: Secondary | ICD-10-CM | POA: Diagnosis not present

## 2019-10-01 ENCOUNTER — Other Ambulatory Visit: Payer: Self-pay

## 2019-10-01 ENCOUNTER — Ambulatory Visit: Payer: Medicare Other | Admitting: Internal Medicine

## 2019-10-01 ENCOUNTER — Ambulatory Visit: Payer: Medicare Other

## 2019-10-01 ENCOUNTER — Ambulatory Visit (INDEPENDENT_AMBULATORY_CARE_PROVIDER_SITE_OTHER): Payer: Medicare Other | Admitting: Family Medicine

## 2019-10-01 ENCOUNTER — Encounter: Payer: Self-pay | Admitting: Family Medicine

## 2019-10-01 VITALS — Ht 72.0 in | Wt 226.0 lb

## 2019-10-01 DIAGNOSIS — G500 Trigeminal neuralgia: Secondary | ICD-10-CM

## 2019-10-01 DIAGNOSIS — J3489 Other specified disorders of nose and nasal sinuses: Secondary | ICD-10-CM

## 2019-10-01 DIAGNOSIS — J302 Other seasonal allergic rhinitis: Secondary | ICD-10-CM | POA: Diagnosis not present

## 2019-10-01 MED ORDER — AZELASTINE HCL 137 MCG/SPRAY NA SOLN: 2.0000 | Freq: Every day | NASAL | 2 refills | Status: DC

## 2019-10-01 MED ORDER — FLUTICASONE PROPIONATE 50 MCG/ACT NA SUSP: NASAL | 3 refills | Status: DC

## 2019-10-01 NOTE — Progress Notes (Signed)
Name: Melissa [name]   MRN: [id number]    DOB: 01/15/1951   Date:10/01/2019       Progress Note  Subjective  Chief Complaint  Chief Complaint  Patient presents with  . Sinus Problem    Started on 09/26/2018  . Trigeminal Neuralgia    Saw neurologist for this and says "it's in remission"    I connected with  [name]  on 10/01/19 at 11:00 AM EST by a video enabled telemedicine application and verified that I am speaking with the correct person using two identifiers.  I discussed the limitations of evaluation and management by telemedicine and the availability of in person appointments. The patient expressed understanding and agreed to proceed. Staff also discussed with the patient that there may be a patient responsible charge related to this service. Patient Location: at home Provider Location: [facility]   HPI  Trigeminal neuralgia: she is doing much better now, she states pain was intense but improved with medication . She is pain free and stopped carbamazepine after a few days, swelling also resolved today  Sinus problem: she states she noticed frontal headache, post-nasal drainage since yesterday, but she took loratadine and symptoms are already improving, she would like to resume nasal sprays and needs rx sent to pharmacy, no nausea, fever, chills. She was feeling tired but seems to be getting better now   Patient Active Problem List   Diagnosis Date Noted  . COVID-19 virus infection 09/06/2019  . Localized swelling of right lower leg 09/06/2019  . OSA on CPAP 09/06/2019  . Secondary hyperparathyroidism of renal origin ([place]) 07/21/2019  . Anemia in chronic kidney disease 06/14/2019  . Benign hypertensive kidney disease with chronic kidney disease 06/14/2019  . Sickle cell trait ([place]) 06/14/2019  . Chronic kidney disease, stage 3b 06/14/2019  . Chronic bronchitis ([place]) 05/19/2019  . Chronic venous insufficiency 04/15/2019  . Lymphedema 04/15/2019   . Swelling of limb 03/30/2019  . Family history of colon cancer   . Trigeminal neuralgia 09/11/2018  . Cardiac LV ejection fraction of 40-49% 05/07/2017  . Low back pain without sciatica 02/21/2017  . Renal cyst 10/18/2016  . Atherosclerosis of aorta ([place]) 10/16/2016  . History of colonic polyps 09/25/2016  . Dyslipidemia 12/08/2015  . Gastroesophageal reflux disease without esophagitis 08/11/2015  . Primary osteoarthritis of knee 06/13/2015  . Hip bursitis 06/13/2015  . RLS (restless legs syndrome) 06/13/2015  . Depression, major, in remission ([place]) 06/13/2015  . B12 deficiency 06/13/2015  . History of Roux-en-Y gastric bypass 06/13/2015  . History of iron deficiency anemia 06/13/2015  . Vitamin D deficiency 06/13/2015  . Hypertension, benign 06/13/2015  . Perennial allergic rhinitis with seasonal variation 06/13/2015  . Obesity (BMI 30.0-34.9) 06/13/2015  . Cervical radiculitis 01/04/2014  . Cervical spinal stenosis 01/04/2014  . Cervical osteoarthritis 01/04/2014    Social History   Tobacco Use  . Smoking status: Never Smoker  . Smokeless tobacco: Never Used  Substance Use Topics  . Alcohol use: Not Currently    Alcohol/week: 0.0 standard drinks    Comment: occasionally     Current Outpatient Medications:  .  albuterol (VENTOLIN HFA) 108 (90 Base) MCG/ACT inhaler, Inhale 2 puffs into the lungs every 6 (six) hours as needed for wheezing or shortness of breath., Disp: 18 g, Rfl: 0 .  ASHWAGANDHA PO, Take by mouth., Disp: , Rfl:  .  aspirin 81 MG EC tablet, TAKE 1 TABLET BY MOUTH EVERY DAY, Disp: 30 tablet,  Rfl: 0 .  atorvastatin (LIPITOR) 40 MG tablet, TAKE 1 TABLET BY MOUTH EVERY DAY, Disp: 90 tablet, Rfl: 1 .  BLACK CURRANT SEED OIL PO, Take by mouth., Disp: , Rfl:  .  Cholecalciferol (VITAMIN D3 ADULT GUMMIES PO), Take 2 each by mouth daily. , Disp: , Rfl:  .  ferrous sulfate (IRON SUPPLEMENT) 325 (65 FE) MG tablet, Take 325 mg by mouth daily with breakfast., Disp: ,  Rfl:  .  losartan (COZAAR) 25 MG tablet, TAKE 1 TABLET BY MOUTH EVERY DAY, Disp: 90 tablet, Rfl: 0 .  magnesium oxide (MAG-OX) 400 MG tablet, Take 400 mg by mouth daily. Reported on 12/08/2015, Disp: , Rfl:  .  metaxalone (SKELAXIN) 800 MG tablet, Take 400-800 mg by mouth 2 (two) times daily as needed., Disp: , Rfl:  .  Multiple Vitamins-Minerals (CENTRUM SILVER) tablet, Take 1 tablet by mouth daily., Disp: 30 tablet, Rfl: 0 .  omeprazole (PRILOSEC) 20 MG capsule, TAKE 1 CAPSULE BY MOUTH EVERY DAY, Disp: 90 capsule, Rfl: 3 .  TURMERIC PO, Take by mouth., Disp: , Rfl:  .  acetaminophen-codeine (TYLENOL #3) 300-30 MG tablet, Take 1-2 tablets by mouth every 4 (four) hours as needed for moderate pain. (Patient not taking: Reported on 10/01/2019), Disp: 30 tablet, Rfl: 0 .  albuterol (PROVENTIL) (2.5 MG/3ML) 0.083% nebulizer solution, Take 3 mLs (2.5 mg total) by nebulization every 6 (six) hours as needed for wheezing or shortness of breath. (Patient not taking: Reported on 10/01/2019), Disp: 75 mL, Rfl: 0 .  Azelastine HCl 137 MCG/SPRAY SOLN, Place 2 sprays into the nose daily. 2 sprays in each nostril daily, Disp: 30 mL, Rfl: 2 .  budesonide (PULMICORT) 0.5 MG/2ML nebulizer solution, Take 2 mLs (0.5 mg total) by nebulization 2 (two) times daily. (Patient not taking: Reported on 10/01/2019), Disp: 120 mL, Rfl: 0 .  carbamazepine (TEGRETOL) 200 MG tablet, Take 1 tablet (200 mg total) by mouth 3 (three) times daily. (Patient not taking: Reported on 10/01/2019), Disp: 90 tablet, Rfl: 0 .  fluticasone (FLONASE) 50 MCG/ACT nasal spray, SPRAY 2 SPRAYS INTO EACH NOSTRIL EVERY DAY, Disp: 16 g, Rfl: 3 .  gabapentin (NEURONTIN) 300 MG capsule, TAKE 1 CAPSULE BY MOUTH TWICE A DAY FOR 4 DAYS THEN 3 TIMES A DAY, Disp: , Rfl:  .  loratadine (CLARITIN) 10 MG tablet, Take 1 tablet (10 mg total) by mouth daily. (Patient not taking: Reported on 10/01/2019), Disp: 90 tablet, Rfl: 0 .  lubiprostone (AMITIZA) 24 MCG capsule, Take 1  capsule (24 mcg total) by mouth 2 (two) times daily with a meal. (Patient not taking: Reported on 10/01/2019), Disp: 180 capsule, Rfl: 0 .  predniSONE (DELTASONE) 10 MG tablet, 3 tabs x7 days with breakfast, 2 tabs x7 days, 1 tab x 7 days then stop, Disp: 42 tablet, Rfl: 0  Allergies  Allergen Reactions  . Contrast Media [Iodinated Diagnostic Agents]   . Shellfish Allergy     Edema    I personally reviewed active problem list, medication list, allergies, family history with the patient/caregiver today.  ROS  Ten systems reviewed and is negative except as mentioned in HPI   Objective  Virtual encounter, vitals not obtained.  Body mass index is 30.65 kg/m.  Nursing Note and Vital Signs reviewed.  Physical Exam  Awake, alert and oriented, no swelling on left side  Assessment & Plan  1. Sinus pressure  - Azelastine HCl 137 MCG/SPRAY SOLN; Place 2 sprays into the nose daily. 2 sprays in  each nostril daily  Dispense: 30 mL; Refill: 2 - fluticasone (FLONASE) 50 MCG/ACT nasal spray; SPRAY 2 SPRAYS INTO EACH NOSTRIL EVERY DAY  Dispense: 16 g; Refill: 3  2. Seasonal allergic rhinitis, unspecified trigger  - Azelastine HCl 137 MCG/SPRAY SOLN; Place 2 sprays into the nose daily. 2 sprays in each nostril daily  Dispense: 30 mL; Refill: 2 - fluticasone (FLONASE) 50 MCG/ACT nasal spray; SPRAY 2 SPRAYS INTO EACH NOSTRIL EVERY DAY  Dispense: 16 g; Refill: 3  3. Trigeminal neuralgia of left side of face  Doing well now  -Red flags and when to present for emergency care or RTC including fever >101.70F, chest pain, shortness of breath, new/worsening/un-resolving symptoms,  reviewed with patient at time of visit. Follow up and care instructions discussed and provided in AVS. - I discussed the assessment and treatment plan with the patient. The patient was provided an opportunity to ask questions and all were answered. The patient agreed with the plan and demonstrated an understanding of the  instructions.  I provided 15 minutes of non-face-to-face time during this encounter.  Loistine Chance, MD

## 2019-10-02 ENCOUNTER — Ambulatory Visit: Payer: Medicare Other | Attending: Internal Medicine

## 2019-10-02 DIAGNOSIS — Z23 Encounter for immunization: Secondary | ICD-10-CM

## 2019-10-02 NOTE — Progress Notes (Signed)
   Covid-19 Vaccination Clinic  Name:  Melissa [name]    MRN: [id number] DOB: [birth year]  10/02/2019  [name] was observed post Covid-19 immunization for 15 minutes without incidence. She was provided with Vaccine Information Sheet and instruction to access the V-Safe system.   [name] was instructed to call 911 with any severe reactions post vaccine: Marland Kitchen Difficulty breathing  . Swelling of your face and throat  . A fast heartbeat  . A bad rash all over your body  . Dizziness and weakness    Immunizations Administered    Name Date Dose VIS Date Route   Pfizer COVID-19 Vaccine 10/02/2019 10:06 AM 0.3 mL 07/30/2019 Intramuscular   Manufacturer: [place]   Lot: [id number]   [place]: [id number]

## 2019-10-08 ENCOUNTER — Ambulatory Visit (INDEPENDENT_AMBULATORY_CARE_PROVIDER_SITE_OTHER): Payer: Medicare Other

## 2019-10-08 VITALS — Ht 72.0 in | Wt 225.0 lb

## 2019-10-08 DIAGNOSIS — Z0000 Encounter for general adult medical examination without abnormal findings: Secondary | ICD-10-CM | POA: Diagnosis not present

## 2019-10-08 NOTE — Progress Notes (Signed)
Subjective:   Melissa [name] is a 69 y.o. female who presents for an Initial Medicare Annual Wellness Visit.  Virtual Visit via Telephone Note  I connected with Melissa [name] on 10/08/19 at 11:20 AM EST by telephone and verified that I am speaking with the correct person using two identifiers.  Medicare Annual Wellness visit completed telephonically due to Covid-19 pandemic.   Location: Patient: home Provider: office   I discussed the limitations, risks, security and privacy concerns of performing an evaluation and management service by telephone and the availability of in person appointments. The patient expressed understanding and agreed to proceed.  Some vital signs may be absent or patient reported.   Clemetine Marker, LPN    Review of Systems      Cardiac Risk Factors include: advanced age (>67men, >70 women);obesity (BMI >30kg/m2);hypertension;dyslipidemia     Objective:    Today's Vitals   10/08/19 1116  Weight: 225 lb (102.1 kg)  Height: 6' (1.829 m)   Body mass index is 30.52 kg/m.  Advanced Directives 10/08/2019 05/04/2019 05/01/2019 03/15/2019 05/07/2017 03/25/2017 02/21/2017  Does Patient Have a Medical Advance Directive? No No No No No No No  Would patient like information on creating a medical advance directive? Yes (MAU/Ambulatory/Procedural Areas - Information given) - - - - - -    Current Medications (verified) Outpatient Encounter Medications as of 10/08/2019  Medication Sig  . albuterol (VENTOLIN HFA) 108 (90 Base) MCG/ACT inhaler Inhale 2 puffs into the lungs every 6 (six) hours as needed for wheezing or shortness of breath.  . ASHWAGANDHA PO Take by mouth.  Marland Kitchen aspirin 81 MG EC tablet TAKE 1 TABLET BY MOUTH EVERY DAY  . atorvastatin (LIPITOR) 40 MG tablet TAKE 1 TABLET BY MOUTH EVERY DAY  . Azelastine HCl 137 MCG/SPRAY SOLN Place 2 sprays into the nose daily. 2 sprays in each nostril daily  . BLACK CURRANT SEED OIL PO Take by mouth.  . budesonide  (PULMICORT) 0.25 MG/2ML nebulizer solution Take 0.25 mg by nebulization 2 (two) times daily. PRN only  . carbamazepine (TEGRETOL) 200 MG tablet Take 1 tablet (200 mg total) by mouth 3 (three) times daily.  . Cholecalciferol (VITAMIN D3 ADULT GUMMIES PO) Take 2 each by mouth daily.   . ferrous sulfate (IRON SUPPLEMENT) 325 (65 FE) MG tablet Take 325 mg by mouth daily with breakfast.  . fluticasone (FLONASE) 50 MCG/ACT nasal spray SPRAY 2 SPRAYS INTO EACH NOSTRIL EVERY DAY  . loratadine (CLARITIN) 10 MG tablet Take 1 tablet (10 mg total) by mouth daily.  Marland Kitchen losartan (COZAAR) 25 MG tablet TAKE 1 TABLET BY MOUTH EVERY DAY  . magnesium oxide (MAG-OX) 400 MG tablet Take 400 mg by mouth daily. Reported on 12/08/2015  . metaxalone (SKELAXIN) 800 MG tablet Take 400-800 mg by mouth 2 (two) times daily as needed.  . Multiple Vitamins-Minerals (CENTRUM SILVER) tablet Take 1 tablet by mouth daily.  Marland Kitchen omeprazole (PRILOSEC) 20 MG capsule TAKE 1 CAPSULE BY MOUTH EVERY DAY  . TURMERIC PO Take by mouth.  . [DISCONTINUED] lubiprostone (AMITIZA) 24 MCG capsule Take 1 capsule (24 mcg total) by mouth 2 (two) times daily with a meal. (Patient not taking: Reported on 10/01/2019)   No facility-administered encounter medications on file as of 10/08/2019.    Allergies (verified) Contrast media [iodinated diagnostic agents] and Shellfish allergy   History: Past Medical History:  Diagnosis Date  . Bruising   . Cervical radiculopathy   . Deficiency of vitamin B   .  Depression   . Edema   . Flank pain   . Gross hematuria   . HTN (hypertension)   . Iron deficiency anemia   . Leukopenia   . OA (osteoarthritis)   . Obesity   . OSA on CPAP   . Osteopenia   . Perennial allergic rhinitis   . Renal cysts, acquired, bilateral   . RLS (restless legs syndrome)   . Sickle cell trait ([place])   . Snoring   . Spinal stenosis   . Trigeminal neuralgia    Past Surgical History:  Procedure Laterality Date  . BARIATRIC  SURGERY    . CESAREAN SECTION     3 or more  . COLONOSCOPY WITH PROPOFOL N/A 03/15/2019   Procedure: COLONOSCOPY WITH PROPOFOL;  Surgeon: [name], MD;  Location: [facility] ENDOSCOPY;  Service: Gastroenterology;  Laterality: N/A;  . GASTRIC BYPASS    . TUBAL LIGATION     Family History  Problem Relation Age of Onset  . Hypercholesterolemia Mother   . Heart disease Mother   . Hypertension Mother   . Alcohol abuse Father   . Lung cancer Brother   . Alcohol abuse Brother   . Diabetes Mellitus II Sister   . Hypertension Maternal Grandmother   . Colon cancer Sister   . Breast cancer Neg Hx    Social History   Socioeconomic History  . Marital status: Widowed    Spouse name: Melissa [name]  . Number of children: 4  . Years of education: Not on file  . Highest education level: Some college, no degree  Occupational History    Comment: retired  Tobacco Use  . Smoking status: Never Smoker  . Smokeless tobacco: Never Used  Substance and Sexual Activity  . Alcohol use: Not Currently    Alcohol/week: 0.0 standard drinks  . Drug use: No  . Sexual activity: Yes    Partners: Male    Birth control/protection: Post-menopausal  Other Topics Concern  . Not on file  Social History Narrative  . Not on file   Social Determinants of Health   Financial Resource Strain: Low Risk   . Difficulty of Paying Living Expenses: Not hard at all  Food Insecurity: No Food Insecurity  . Worried About Charity fundraiser in the Last Year: Never true  . Ran Out of Food in the Last Year: Never true  Transportation Needs: No Transportation Needs  . Lack of Transportation (Medical): No  . Lack of Transportation (Non-Medical): No  Physical Activity: Inactive  . Days of Exercise per Week: 0 days  . Minutes of Exercise per Session: 0 min  Stress: No Stress Concern Present  . Feeling of Stress : Not at all  Social Connections: Somewhat Isolated  . Frequency of Communication with Friends and Family:  More than three times a week  . Frequency of Social Gatherings with Friends and Family: More than three times a week  . Attends Religious Services: Never  . Active Member of Clubs or Organizations: No  . Attends Archivist Meetings: Never  . Marital Status: Married    Tobacco Counseling Counseling given: Not Answered   Clinical Intake:  Pre-visit preparation completed: Yes  Pain : No/denies pain     BMI - recorded: 30.52 Nutritional Status: BMI > 30  Obese Nutritional Risks: None Diabetes: No  How often do you need to have someone help you when you read instructions, pamphlets, or other written materials from your doctor or pharmacy?: 1 -  Never  Interpreter Needed?: No  Information entered by :: Clemetine Marker LPN   Activities of Daily Living In your present state of health, do you have any difficulty performing the following activities: 10/08/2019 10/01/2019  Hearing? N N  Comment declines hearing aids -  Vision? N N  Difficulty concentrating or making decisions? N N  Walking or climbing stairs? N N  Dressing or bathing? N N  Doing errands, shopping? N N  Preparing Food and eating ? N -  Using the Toilet? N -  In the past six months, have you accidently leaked urine? N -  Do you have problems with loss of bowel control? N -  Managing your Medications? N -  Managing your Finances? N -  Housekeeping or managing your Housekeeping? N -  Some recent data might be hidden     Immunizations and Health Maintenance Immunization History  Administered Date(s) Administered  . Fluad Quad(high Dose 65+) 07/01/2019  . Influenza, High Dose Seasonal PF 07/02/2018  . Influenza,inj,Quad PF,6+ Mos 06/13/2015, 08/21/2016, 05/07/2017  . PFIZER SARS-COV-2 Vaccination 10/02/2019  . Pneumococcal Conjugate-13 02/24/2018  . Tdap 04/29/2011  . Zoster 08/21/2016  . Zoster Recombinat (Shingrix) 02/24/2018, 10/29/2018   Health Maintenance Due  Topic Date Due  . PNA vac Low  Risk Adult (2 of 2 - PPSV23) 02/25/2019    Patient Care Team: Steele Sizer, MD as PCP - General (Family Medicine) Rockey Situ Kathlene November, MD as PCP - Cardiology (Cardiology) Steele Sizer, MD as Attending Physician (Family Medicine) Bary Castilla, Forest Gleason, MD (General Surgery) Lucilla Lame, MD as Consulting Physician (Gastroenterology) Anabel Bene, MD as Referring Physician (Neurology) Sharlet Salina, MD as Referring Physician (Physical Medicine and Rehabilitation) Hessie Knows, MD as Consulting Physician (Orthopedic Surgery)  Indicate any recent Medical Services you may have received from other than [facility] providers in the past year (date may be approximate).     Assessment:   This is a routine wellness examination for [place].  Hearing/Vision screen  Hearing Screening   125Hz  250Hz  500Hz  1000Hz  2000Hz  3000Hz  4000Hz  6000Hz  8000Hz   Right ear:           Left ear:           Comments: Pt denies hearing aids  Vision Screening Comments: Annual vision screenings with [facility]  Dietary issues and exercise activities discussed: Current Exercise Habits: The patient does not participate in regular exercise at present, Exercise limited by: None identified  Goals    . Patient Stated     Patient states she is looking forward to planting a vegetable garden this year      Depression Screen PHQ 2/9 Scores 10/08/2019 10/01/2019 09/23/2019 09/21/2019 07/21/2019 05/19/2019 05/03/2019  PHQ - 2 Score 0 0 0 0 0 0 0  PHQ- 9 Score - 1 1 1 1  0 0    Fall Risk Fall Risk  10/08/2019 10/01/2019 09/23/2019 09/21/2019 07/21/2019  Falls in the past year? 1 1 1 1  0  Comment - - - August 2020 -  Number falls in past yr: 1 0 0 0 0  Injury with Fall? 1 1 1 1  0  Comment fracture of pinky finger on right hand - - Fractured pinky -  Risk for fall due to : History of fall(s) - - - -  Follow up Falls prevention discussed - - - -    FALL RISK PREVENTION PERTAINING TO THE HOME:  Any stairs in or around  the home? Yes  If so, do  they handrails? Yes   Home free of loose throw rugs in walkways, pet beds, electrical cords, etc? Yes  Adequate lighting in your home to reduce risk of falls? Yes   ASSISTIVE DEVICES UTILIZED TO PREVENT FALLS:  Life alert? No  Use of a cane, walker or w/c? No  Grab bars in the bathroom? Yes  Shower chair or bench in shower? No  Elevated toilet seat or a handicapped toilet? No   DME ORDERS:  DME order needed?  No   TIMED UP AND GO:  Was the test performed? No . Telephonic visit.   Education: Fall risk prevention has been discussed.  Intervention(s) required? No   Cognitive Function: 6CIT deferred for 2021 AWV; pt has no memory issues.         Screening Tests Health Maintenance  Topic Date Due  . PNA vac Low Risk Adult (2 of 2 - PPSV23) 02/25/2019  . TETANUS/TDAP  04/28/2021  . MAMMOGRAM  09/09/2021  . COLONOSCOPY  03/14/2022  . INFLUENZA VACCINE  Completed  . DEXA SCAN  Completed  . Hepatitis C Screening  Completed    Qualifies for Shingles Vaccine? Shingrix series completed.  Tdap: Up to date  Flu Vaccine: Up to date  Pneumococcal Vaccine: Due for Pneumococcal vaccine. Does the patient want to receive this vaccine today?  No . Education has been provided regarding the importance of this vaccine but still declined. Advised may receive this vaccine at local pharmacy or Health Dept. Aware to provide a copy of the vaccination record if obtained from local pharmacy or Health Dept. Verbalized acceptance and understanding.   Cancer Screenings:  Colorectal Screening: Completed 03/15/19. Repeat every 3 years;   Mammogram: Completed 09/10/19. Repeat every year;  Bone Density: Completed 09/08/18. Results reflect NORMAL. Repeat every 2 years.  Lung Cancer Screening: (Low Dose CT Chest recommended if Age 72-80 years, 30 pack-year currently smoking OR have quit w/in 15years.) does not qualify.    Additional Screening:  Hepatitis C Screening:  does qualify; Completed 11/13/12  Vision Screening: Recommended annual ophthalmology exams for early detection of glaucoma and other disorders of the eye. Is the patient up to date with their annual eye exam?  Yes  Who is the provider or what is the name of the office in which the pt attends annual eye exams? [place]  Dental Screening: Recommended annual dental exams for proper oral hygiene  Community Resource Referral:  CRR required this visit?  No       Plan:    I have personally reviewed and addressed the Medicare Annual Wellness questionnaire and have noted the following in the patient's chart:  A. Medical and social history B. Use of alcohol, tobacco or illicit drugs  C. Current medications and supplements D. Functional ability and status E.  Nutritional status F.  Physical activity G. Advance directives H. List of other physicians I.  Hospitalizations, surgeries, and ER visits in previous 12 months J.  [place] such as hearing and vision if needed, cognitive and depression L. Referrals and appointments   In addition, I have reviewed and discussed with patient certain preventive protocols, quality metrics, and best practice recommendations. A written personalized care plan for preventive services as well as general preventive health recommendations were provided to patient.   Signed,  Clemetine Marker, LPN Nurse Health Advisor   Nurse Notes: none

## 2019-10-08 NOTE — Patient Instructions (Signed)
Melissa [name] , Thank you for taking time to come for your Medicare Wellness Visit. I appreciate your ongoing commitment to your health goals. Please review the following plan we discussed and let me know if I can assist you in the future.   Screening recommendations/referrals: Colonoscopy: done 03/15/19. Repeat in 2023. Mammogram: done 09/10/19 Bone Density: done 09/08/18 Recommended yearly ophthalmology/optometry visit for glaucoma screening and checkup Recommended yearly dental visit for hygiene and checkup  Vaccinations: Influenza vaccine: done 07/01/19 Pneumococcal vaccine: done 02/24/18. Due for second dose.  Tdap vaccine: done 04/29/11 Shingles vaccine: Shingrix series completed 10/29/18  Advanced directives: Advance directive discussed with you today. I have provided a copy for you to complete at home and have notarized. Once this is complete please bring a copy in to our office so we can scan it into your chart.  Conditions/risks identified: Recommend increasing physical activity.   Next appointment: Please follow up in one year for your Medicare Annual Wellness visit.     Preventive Care 18 Years and Older, Female Preventive care refers to lifestyle choices and visits with your health care provider that can promote health and wellness. What does preventive care include?  A yearly physical exam. This is also called an annual well check.  Dental exams once or twice a year.  Routine eye exams. Ask your health care provider how often you should have your eyes checked.  Personal lifestyle choices, including:  Daily care of your teeth and gums.  Regular physical activity.  Eating a healthy diet.  Avoiding tobacco and drug use.  Limiting alcohol use.  Practicing safe sex.  Taking low-dose aspirin every day.  Taking vitamin and mineral supplements as recommended by your health care provider. What happens during an annual well check? The services and screenings done by your  health care provider during your annual well check will depend on your age, overall health, lifestyle risk factors, and family history of disease. Counseling  Your health care provider may ask you questions about your:  Alcohol use.  Tobacco use.  Drug use.  Emotional well-being.  Home and relationship well-being.  Sexual activity.  Eating habits.  History of falls.  Memory and ability to understand (cognition).  Work and work Statistician.  Reproductive health. Screening  You may have the following tests or measurements:  Height, weight, and BMI.  Blood pressure.  Lipid and cholesterol levels. These may be checked every 5 years, or more frequently if you are over 22 years old.  Skin check.  Lung cancer screening. You may have this screening every year starting at age 81 if you have a 30-pack-year history of smoking and currently smoke or have quit within the past 15 years.  Fecal occult blood test (FOBT) of the stool. You may have this test every year starting at age 79.  Flexible sigmoidoscopy or colonoscopy. You may have a sigmoidoscopy every 5 years or a colonoscopy every 10 years starting at age 37.  Hepatitis C blood test.  Hepatitis B blood test.  Sexually transmitted disease (STD) testing.  Diabetes screening. This is done by checking your blood sugar (glucose) after you have not eaten for a while (fasting). You may have this done every 1-3 years.  Bone density scan. This is done to screen for osteoporosis. You may have this done starting at age 7.  Mammogram. This may be done every 1-2 years. Talk to your health care provider about how often you should have regular mammograms. Talk with your health  care provider about your test results, treatment options, and if necessary, the need for more tests. Vaccines  Your health care provider may recommend certain vaccines, such as:  Influenza vaccine. This is recommended every year.  Tetanus, diphtheria, and  acellular pertussis (Tdap, Td) vaccine. You may need a Td booster every 10 years.  Zoster vaccine. You may need this after age 16.  Pneumococcal 13-valent conjugate (PCV13) vaccine. One dose is recommended after age 35.  Pneumococcal polysaccharide (PPSV23) vaccine. One dose is recommended after age 65. Talk to your health care provider about which screenings and vaccines you need and how often you need them. This information is not intended to replace advice given to you by your health care provider. Make sure you discuss any questions you have with your health care provider. Document Released: 09/01/2015 Document Revised: 04/24/2016 Document Reviewed: 06/06/2015 Elsevier Interactive Patient Education  2017 [employer] Prevention in the Home Falls can cause injuries. They can happen to people of all ages. There are many things you can do to make your home safe and to help prevent falls. What can I do on the outside of my home?  Regularly fix the edges of walkways and driveways and fix any cracks.  Remove anything that might make you trip as you walk through a door, such as a raised step or threshold.  Trim any bushes or trees on the path to your home.  Use bright outdoor lighting.  Clear any walking paths of anything that might make someone trip, such as rocks or tools.  Regularly check to see if handrails are loose or broken. Make sure that both sides of any steps have handrails.  Any raised decks and porches should have guardrails on the edges.  Have any leaves, snow, or ice cleared regularly.  Use sand or salt on walking paths during winter.  Clean up any spills in your garage right away. This includes oil or grease spills. What can I do in the bathroom?  Use night lights.  Install grab bars by the toilet and in the tub and shower. Do not use towel bars as grab bars.  Use non-skid mats or decals in the tub or shower.  If you need to sit down in the shower, use  a plastic, non-slip stool.  Keep the floor dry. Clean up any water that spills on the floor as soon as it happens.  Remove soap buildup in the tub or shower regularly.  Attach bath mats securely with double-sided non-slip rug tape.  Do not have throw rugs and other things on the floor that can make you trip. What can I do in the bedroom?  Use night lights.  Make sure that you have a light by your bed that is easy to reach.  Do not use any sheets or blankets that are too big for your bed. They should not hang down onto the floor.  Have a firm chair that has side arms. You can use this for support while you get dressed.  Do not have throw rugs and other things on the floor that can make you trip. What can I do in the kitchen?  Clean up any spills right away.  Avoid walking on wet floors.  Keep items that you use a lot in easy-to-reach places.  If you need to reach something above you, use a strong step stool that has a grab bar.  Keep electrical cords out of the way.  Do not use floor  polish or wax that makes floors slippery. If you must use wax, use non-skid floor wax.  Do not have throw rugs and other things on the floor that can make you trip. What can I do with my stairs?  Do not leave any items on the stairs.  Make sure that there are handrails on both sides of the stairs and use them. Fix handrails that are broken or loose. Make sure that handrails are as long as the stairways.  Check any carpeting to make sure that it is firmly attached to the stairs. Fix any carpet that is loose or worn.  Avoid having throw rugs at the top or bottom of the stairs. If you do have throw rugs, attach them to the floor with carpet tape.  Make sure that you have a light switch at the top of the stairs and the bottom of the stairs. If you do not have them, ask someone to add them for you. What else can I do to help prevent falls?  Wear shoes that:  Do not have high heels.  Have  rubber bottoms.  Are comfortable and fit you well.  Are closed at the toe. Do not wear sandals.  If you use a stepladder:  Make sure that it is fully opened. Do not climb a closed stepladder.  Make sure that both sides of the stepladder are locked into place.  Ask someone to hold it for you, if possible.  Clearly mark and make sure that you can see:  Any grab bars or handrails.  First and last steps.  Where the edge of each step is.  Use tools that help you move around (mobility aids) if they are needed. These include:  Canes.  Walkers.  Scooters.  Crutches.  Turn on the lights when you go into a dark area. Replace any light bulbs as soon as they burn out.  Set up your furniture so you have a clear path. Avoid moving your furniture around.  If any of your floors are uneven, fix them.  If there are any pets around you, be aware of where they are.  Review your medicines with your doctor. Some medicines can make you feel dizzy. This can increase your chance of falling. Ask your doctor what other things that you can do to help prevent falls. This information is not intended to replace advice given to you by your health care provider. Make sure you discuss any questions you have with your health care provider. Document Released: 06/01/2009 Document Revised: 01/11/2016 Document Reviewed: 09/09/2014 Elsevier Interactive Patient Education  2017 [employer].

## 2019-10-11 ENCOUNTER — Encounter: Payer: Self-pay | Admitting: Acute Care

## 2019-10-11 ENCOUNTER — Other Ambulatory Visit: Payer: Self-pay

## 2019-10-11 ENCOUNTER — Ambulatory Visit (INDEPENDENT_AMBULATORY_CARE_PROVIDER_SITE_OTHER): Payer: Medicare Other | Admitting: Acute Care

## 2019-10-11 DIAGNOSIS — R918 Other nonspecific abnormal finding of lung field: Secondary | ICD-10-CM | POA: Diagnosis not present

## 2019-10-11 DIAGNOSIS — Z9989 Dependence on other enabling machines and devices: Secondary | ICD-10-CM | POA: Diagnosis not present

## 2019-10-11 DIAGNOSIS — G4733 Obstructive sleep apnea (adult) (pediatric): Secondary | ICD-10-CM | POA: Diagnosis not present

## 2019-10-11 DIAGNOSIS — R0609 Other forms of dyspnea: Secondary | ICD-10-CM

## 2019-10-11 DIAGNOSIS — R0600 Dyspnea, unspecified: Secondary | ICD-10-CM | POA: Diagnosis not present

## 2019-10-11 NOTE — Patient Instructions (Addendum)
It is good to talk with  you today. We will make sure you are scheduled for PFT's. We will do a 6 month follow up CT Chest without contrast to evaluate pulmonary nodule noted in 04/2019 scan. ( March of 2021)  You are doing a great job with your CPAP. You are benefiting from treatment.  Continue on CPAP at bedtime. You appear to be benefiting from the treatment  Goal is to wear for at least 6 hours each night for maximal clinical benefit. Continue to work on weight loss, as the link between excess weight  and sleep apnea is well established.   Remember to establish a good bedtime routine, and work on sleep hygiene.  Limit daytime naps , avoid stimulants such as caffeine and nicotine close to bedtime, exercise daily to promote sleep quality, avoid heavy , spicy, fried , or rich foods before bed. Ensure adequate exposure to natural light during the day,establish a relaxing bedtime routine with a pleasant sleep environment ( Bedroom between 60 and 67 degrees, turn off bright lights , TV or device screens screens , consider black out curtains or white noise machines) Do not drive if sleepy. Remember to clean mask, tubing, filter, and reservoir once weekly with soapy water.  Follow up with Dr. Mortimer Fries 6 months or before as needed.

## 2019-10-11 NOTE — Progress Notes (Signed)
Virtual Tele Visit I connected with [name] on 10/13/19 at  2:30 PM EST by telephone and verified that I am speaking with the correct person using two identifiers.  Location: Patient: At home Provider: [place], [place], [address]   I discussed the limitations, risks, security and privacy concerns of performing an evaluation and management service by telephone and the availability of in person appointments. I also discussed with the patient that there may be a patient responsible charge related to this service. The patient expressed understanding and agreed to proceed.  Melissa [name] is a 69 y.o. female with OSA onCPAP, history of asthma as a child. She had COVID 19  04/2019. She did not require hospitalization, but she lost her husband to the virus. She has  having post COVID dyspnea . She is followed by Dr. Mortimer Fries.    History of Present Illness: Pt. Presents for follow up. She was last seen 08/2019 with continued exertional dyspnea after COVID 04/2019. She had a + d dimer with  bilateral LE dopplers that  were negative for DVT, VQ scan was done which was low probability  for PE. Pt. States she has been doing well. She is compliant with her albuterol and Pulmicort nebs. She is compliant with her CPAP. PFT's are pending.  She is compliant with her Claritin, Flonase, Prilosec  and Azelastine spray.   Pt has had VTE work up for post Covid  dyspnea. She is negative for VTE. I discussed her with Dr. Valeta Harms, and we treated  her with a slow pred taper to see if her dyspnea is steroid responsive in 08/2019. Labs endorse a slightly elevated d dimer, and CXR with notation of post covid scarring.  Suspect there could be an inflammatory component to her dyspnea. If she has no improvement we will follow up with a non-contrast HRCT in the future.    She states she has been feeling much better. She has no pain in her back. She has not had to use her nebs in 3 weeks. She feels that the last  taper  of prednisone helped. She states she is back to her baseline. She has had her first dose of COVID vaccine and is scheduled for her second dose 10/23/2019.She denies fever, chest pain, orthopnea or hemoptysis.    10/11/2019 Down Load 09/06/2019-10/05/2019 Usage 28/30 days ( 93%) Average usage>> 8 hours 10 minutes Auto Set 5-15 cm H2O Median pressure 9.6 cm H2O Max pressure 13.2 cm H2O AHI 1.8   Observations/Objective: ** Covid + 04/2019>> No hospitalization **CT chest 05/01/2019>> bilateral scattered infiltrates, predominantly upper lobe. **HST 06/03/2018>> mild sleep apnea with AHI of 10. **chest x-ray7/6/18>>hyperinflation consistent with emphysema. **CBC 02/21/17>>eosinophils equals 300.  Physical Exam   Assessment and Plan: Exertional Dyspnea  Steroid responsive? Scarring 2/2 COVID 19 Not needing pulmonary nebs after steroids.  Plan Resolved with steroid dosing We will order PFT's  We will continue albuterol rescue inhaler as needed for shortness of breath or wheezing. ( up to 3 times daily) If you need rescue inhaler more than 3 times daily, please call for an appointment  OSA on CPAP Good compliance  Great compliance  AHI is well controlled at 1.8 Plan Continue on CPAP at bedtime. You appear to be benefiting from the treatment Goal is to wear for at least 6 hours each night for maximal clinical benefit. Continue to work on weight loss, as the link between excess weight  and sleep apnea is well established.  Remember to establish a good bedtime routine, and work on sleep hygiene. Limit daytime naps , avoid stimulants such as caffeine and nicotine close to bedtime, exercise daily to promote sleep quality, avoid heavy , spicy, fried , or rich foods before bed. Ensure adequate exposure to natural light during the day,establish a relaxing bedtime routine with a pleasant sleep environment ( Bedroom between 60 and 67 degrees, turn off bright lights , TV or device screens  screens , consider black out curtains or white noise machines) Do not drive if sleepy. Remember to clean mask, tubing, filter, and reservoir once weekly with soapy water.  Follow up with Dr. Mortimer Fries or NP in 12  months.  Follow Up Instructions: Follow up in 12  months with Dr. Mortimer Fries or NP    I discussed the assessment and treatment plan with the patient. The patient was provided an opportunity to ask questions and all were answered. The patient agreed with the plan and demonstrated an understanding of the instructions.   The patient was advised to call back or seek an in-person evaluation if the symptoms worsen or if the condition fails to improve as anticipated.  I provided 30 minutes   of non-face-to-face time during this encounter.  Magdalen Spatz, MSN, AGACNP-BC [place] Pager # [phone] After 4 pm please call [phone] 10/13/2019 1:19 PM

## 2019-10-12 ENCOUNTER — Ambulatory Visit: Payer: Medicare Other

## 2019-10-12 ENCOUNTER — Ambulatory Visit (INDEPENDENT_AMBULATORY_CARE_PROVIDER_SITE_OTHER): Payer: Medicare Other | Admitting: Vascular Surgery

## 2019-10-13 ENCOUNTER — Encounter: Payer: Self-pay | Admitting: Acute Care

## 2019-10-13 DIAGNOSIS — M48062 Spinal stenosis, lumbar region with neurogenic claudication: Secondary | ICD-10-CM | POA: Diagnosis not present

## 2019-10-15 ENCOUNTER — Other Ambulatory Visit: Payer: Self-pay | Admitting: Family Medicine

## 2019-10-15 DIAGNOSIS — G500 Trigeminal neuralgia: Secondary | ICD-10-CM

## 2019-10-15 NOTE — Telephone Encounter (Signed)
Requested medication (s) are due for refill today: yes  Requested medication (s) are on the active medication list: yes  Last refill:  09/23/19  Future visit scheduled: yes  Notes to clinic:  not delegated   Requested Prescriptions  Pending Prescriptions Disp Refills   carbamazepine (TEGRETOL) 200 MG tablet [Pharmacy Med Name: CARBAMAZEPINE 200 MG TABLET] 90 tablet 0    Sig: Take 1 tablet (200 mg total) by mouth 3 (three) times daily.      Not Delegated - Neurology:  Anticonvulsants - carbamazepine Failed - 10/15/2019  8:38 AM      Failed - This refill cannot be delegated      Failed - AST in normal range and within 90 days    AST  Date Value Ref Range Status  05/21/2019 28 15 - 41 U/L Final   SGOT(AST)  Date Value Ref Range Status  04/15/2014 24 15 - 37 Unit/L Final          Failed - ALT in normal range and within 90 days    ALT  Date Value Ref Range Status  05/21/2019 19 0 - 44 U/L Final   SGPT (ALT)  Date Value Ref Range Status  04/15/2014 12 (L) U/L Final    Comment:    14-63 NOTE: New Reference Range 03/08/14           Failed - Carbamazepine (serum) in normal range and within 360 days    No results found for: CBMZ, LABCARB        Failed - Na in normal range and within 90 days    Sodium  Date Value Ref Range Status  05/21/2019 140 135 - 145 mmol/L Final  06/28/2015 141 136 - 144 mmol/L Final  04/15/2014 139 136 - 145 mmol/L Final          Passed - WBC in normal range and within 90 days    WBC  Date Value Ref Range Status  09/06/2019 4.6 4.0 - 10.5 K/uL Final          Passed - PLT in normal range and within 90 days    Platelets  Date Value Ref Range Status  09/06/2019 252 150 - 400 K/uL Final  06/28/2015 317 150 - 379 x10E3/uL Final          Passed - HGB in normal range and within 90 days    Hemoglobin  Date Value Ref Range Status  09/06/2019 12.2 12.0 - 15.0 g/dL Final  06/28/2015 10.9 (L) 11.1 - 15.9 g/dL Final          Passed - HCT  in normal range and within 90 days    HCT  Date Value Ref Range Status  09/06/2019 37.1 36.0 - 46.0 % Final   Hematocrit  Date Value Ref Range Status  06/28/2015 35.2 34.0 - 46.6 % Final          Passed - Valid encounter within last 12 months    Recent Outpatient Visits           2 weeks ago Sinus pressure   [facility], Drue Stager, MD   3 weeks ago Trigeminal neuralgia of left side of face   [facility] Steele Sizer, MD   3 weeks ago Right lower quadrant pain   [facility] Steele Sizer, MD   2 months ago Atherosclerosis of aorta [facility])   [facility] Steele Sizer, MD   4 months ago History  of 2019 novel coronavirus disease (COVID-19)   [facility] Steele Sizer, MD       Future Appointments             In 5 days Steele Sizer, MD [facility], [place]   In 12 months  [place]

## 2019-10-20 ENCOUNTER — Ambulatory Visit: Payer: Medicare Other | Admitting: Family Medicine

## 2019-10-23 ENCOUNTER — Other Ambulatory Visit: Payer: Self-pay

## 2019-10-23 ENCOUNTER — Ambulatory Visit: Payer: Medicare Other | Attending: Internal Medicine

## 2019-10-23 DIAGNOSIS — Z23 Encounter for immunization: Secondary | ICD-10-CM

## 2019-10-23 NOTE — Progress Notes (Signed)
   Covid-19 Vaccination Clinic  Name:  Melissa [name]    MRN: [id number] DOB: 05-03-1951  10/23/2019  [name] was observed post Covid-19 immunization for 15 minutes without incident. She was provided with Vaccine Information Sheet and instruction to access the V-Safe system.   [name] was instructed to call 911 with any severe reactions post vaccine: Marland Kitchen Difficulty breathing  . Swelling of face and throat  . A fast heartbeat  . A bad rash all over body  . Dizziness and weakness   Immunizations Administered    Name Date Dose VIS Date Route   Pfizer COVID-19 Vaccine 10/23/2019 10:19 AM 0.3 mL 07/30/2019 Intramuscular   Manufacturer: [place]   Lot: [id number]   [place]: [id number]

## 2019-10-23 NOTE — Progress Notes (Signed)
   Covid-19 Vaccination Clinic  Name:  Melissa [name]    MRN: [id number] DOB: Jun 04, 1951  10/23/2019  [name] was observed post Covid-19 immunization for 15 minutes without incident. She was provided with Vaccine Information Sheet and instruction to access the V-Safe system.   [name] was instructed to call 911 with any severe reactions post vaccine: Marland Kitchen Difficulty breathing  . Swelling of face and throat  . A fast heartbeat  . A bad rash all over body  . Dizziness and weakness

## 2019-10-26 DIAGNOSIS — F411 Generalized anxiety disorder: Secondary | ICD-10-CM | POA: Diagnosis not present

## 2019-10-27 ENCOUNTER — Other Ambulatory Visit: Payer: Self-pay

## 2019-10-27 ENCOUNTER — Ambulatory Visit
Admission: RE | Admit: 2019-10-27 | Discharge: 2019-10-27 | Disposition: A | Discharge status: Home / Self Care | Payer: Medicare Other | Source: Ambulatory Visit | Attending: Acute Care | Admitting: Acute Care

## 2019-10-27 DIAGNOSIS — R911 Solitary pulmonary nodule: Secondary | ICD-10-CM | POA: Diagnosis not present

## 2019-10-31 ENCOUNTER — Other Ambulatory Visit: Payer: Self-pay | Admitting: Family Medicine

## 2019-10-31 DIAGNOSIS — G500 Trigeminal neuralgia: Secondary | ICD-10-CM

## 2019-10-31 NOTE — Telephone Encounter (Signed)
Requested medication (s) are due for refill today: no  Requested medication (s) are on the active medication list: yes  Last refill:  10/17/19  Future visit scheduled: yes  Notes to clinic:  pt requesting additional 90 day refills The medication cannot be delegated to NT to refill   Requested Prescriptions  Pending Prescriptions Disp Refills   carbamazepine (TEGRETOL) 200 MG tablet [Pharmacy Med Name: CARBAMAZEPINE 200 MG TABLET] 270 tablet 1    Sig: TAKE 1 TABLET (200 MG TOTAL) BY MOUTH 3 (THREE) TIMES DAILY.      Not Delegated - Neurology:  Anticonvulsants - carbamazepine Failed - 10/31/2019  5:37 PM      Failed - This refill cannot be delegated      Failed - AST in normal range and within 90 days    AST  Date Value Ref Range Status  05/21/2019 28 15 - 41 U/L Final   SGOT(AST)  Date Value Ref Range Status  04/15/2014 24 15 - 37 Unit/L Final          Failed - ALT in normal range and within 90 days    ALT  Date Value Ref Range Status  05/21/2019 19 0 - 44 U/L Final   SGPT (ALT)  Date Value Ref Range Status  04/15/2014 12 (L) U/L Final    Comment:    14-63 NOTE: New Reference Range 03/08/14           Failed - Carbamazepine (serum) in normal range and within 360 days    No results found for: CBMZ, LABCARB        Failed - Na in normal range and within 90 days    Sodium  Date Value Ref Range Status  05/21/2019 140 135 - 145 mmol/L Final  06/28/2015 141 136 - 144 mmol/L Final  04/15/2014 139 136 - 145 mmol/L Final          Passed - WBC in normal range and within 90 days    WBC  Date Value Ref Range Status  09/06/2019 4.6 4.0 - 10.5 K/uL Final          Passed - PLT in normal range and within 90 days    Platelets  Date Value Ref Range Status  09/06/2019 252 150 - 400 K/uL Final  06/28/2015 317 150 - 379 x10E3/uL Final          Passed - HGB in normal range and within 90 days    Hemoglobin  Date Value Ref Range Status  09/06/2019 12.2 12.0 - 15.0  g/dL Final  06/28/2015 10.9 (L) 11.1 - 15.9 g/dL Final          Passed - HCT in normal range and within 90 days    HCT  Date Value Ref Range Status  09/06/2019 37.1 36.0 - 46.0 % Final   Hematocrit  Date Value Ref Range Status  06/28/2015 35.2 34.0 - 46.6 % Final          Passed - Valid encounter within last 12 months    Recent Outpatient Visits           1 month ago Sinus pressure   [facility], Drue Stager, MD   1 month ago Trigeminal neuralgia of left side of face   [facility] Steele Sizer, MD   1 month ago Right lower quadrant pain   [facility] [place], Drue Stager, MD   3 months ago Atherosclerosis of aorta [facility])  [facility] Steele Sizer, MD   5 months ago History of 2019 novel coronavirus disease (COVID-19)   [facility] Steele Sizer, MD       Future Appointments             In 76 months [facility], [facility]

## 2019-11-02 DIAGNOSIS — M170 Bilateral primary osteoarthritis of knee: Secondary | ICD-10-CM | POA: Diagnosis not present

## 2019-11-11 DIAGNOSIS — M5136 Other intervertebral disc degeneration, lumbar region: Secondary | ICD-10-CM | POA: Diagnosis not present

## 2019-11-11 DIAGNOSIS — M5416 Radiculopathy, lumbar region: Secondary | ICD-10-CM | POA: Diagnosis not present

## 2019-11-11 DIAGNOSIS — M48062 Spinal stenosis, lumbar region with neurogenic claudication: Secondary | ICD-10-CM | POA: Diagnosis not present

## 2019-11-23 DIAGNOSIS — F411 Generalized anxiety disorder: Secondary | ICD-10-CM | POA: Diagnosis not present

## 2019-11-23 DIAGNOSIS — J410 Simple chronic bronchitis: Secondary | ICD-10-CM | POA: Diagnosis not present

## 2019-11-30 DIAGNOSIS — I129 Hypertensive chronic kidney disease with stage 1 through stage 4 chronic kidney disease, or unspecified chronic kidney disease: Secondary | ICD-10-CM | POA: Diagnosis not present

## 2019-11-30 DIAGNOSIS — N1832 Chronic kidney disease, stage 3b: Secondary | ICD-10-CM | POA: Diagnosis not present

## 2019-11-30 DIAGNOSIS — N390 Urinary tract infection, site not specified: Secondary | ICD-10-CM | POA: Diagnosis not present

## 2019-11-30 DIAGNOSIS — D631 Anemia in chronic kidney disease: Secondary | ICD-10-CM | POA: Diagnosis not present

## 2019-12-03 ENCOUNTER — Other Ambulatory Visit: Payer: Self-pay | Admitting: Family Medicine

## 2019-12-03 DIAGNOSIS — I10 Essential (primary) hypertension: Secondary | ICD-10-CM

## 2019-12-03 NOTE — Telephone Encounter (Signed)
Requested  medications are  due for refill today yes  Requested medications are on the active medication list yes  Last refill 09/09/19  Last OV - July 2020  Future visit scheduled 2022  Notes to clinic Many OV but none that addressed HTN since July which is more than the required 6 month limit per protocol.

## 2019-12-07 DIAGNOSIS — F411 Generalized anxiety disorder: Secondary | ICD-10-CM | POA: Diagnosis not present

## 2019-12-09 DIAGNOSIS — M5136 Other intervertebral disc degeneration, lumbar region: Secondary | ICD-10-CM | POA: Diagnosis not present

## 2019-12-09 DIAGNOSIS — M48062 Spinal stenosis, lumbar region with neurogenic claudication: Secondary | ICD-10-CM | POA: Diagnosis not present

## 2019-12-09 DIAGNOSIS — M47816 Spondylosis without myelopathy or radiculopathy, lumbar region: Secondary | ICD-10-CM | POA: Diagnosis not present

## 2019-12-09 DIAGNOSIS — M5416 Radiculopathy, lumbar region: Secondary | ICD-10-CM | POA: Diagnosis not present

## 2019-12-10 DIAGNOSIS — M5136 Other intervertebral disc degeneration, lumbar region: Secondary | ICD-10-CM | POA: Diagnosis not present

## 2019-12-10 DIAGNOSIS — M5416 Radiculopathy, lumbar region: Secondary | ICD-10-CM | POA: Diagnosis not present

## 2019-12-10 DIAGNOSIS — M48062 Spinal stenosis, lumbar region with neurogenic claudication: Secondary | ICD-10-CM | POA: Diagnosis not present

## 2019-12-17 DIAGNOSIS — M48061 Spinal stenosis, lumbar region without neurogenic claudication: Secondary | ICD-10-CM | POA: Diagnosis not present

## 2019-12-17 DIAGNOSIS — M6281 Muscle weakness (generalized): Secondary | ICD-10-CM | POA: Diagnosis not present

## 2019-12-20 DIAGNOSIS — M48061 Spinal stenosis, lumbar region without neurogenic claudication: Secondary | ICD-10-CM | POA: Diagnosis not present

## 2019-12-20 DIAGNOSIS — M6281 Muscle weakness (generalized): Secondary | ICD-10-CM | POA: Diagnosis not present

## 2019-12-22 DIAGNOSIS — M6281 Muscle weakness (generalized): Secondary | ICD-10-CM | POA: Diagnosis not present

## 2019-12-22 DIAGNOSIS — M48061 Spinal stenosis, lumbar region without neurogenic claudication: Secondary | ICD-10-CM | POA: Diagnosis not present

## 2019-12-24 ENCOUNTER — Other Ambulatory Visit: Payer: Self-pay | Admitting: Family Medicine

## 2019-12-24 DIAGNOSIS — J3489 Other specified disorders of nose and nasal sinuses: Secondary | ICD-10-CM

## 2019-12-24 DIAGNOSIS — J302 Other seasonal allergic rhinitis: Secondary | ICD-10-CM

## 2019-12-29 DIAGNOSIS — M48061 Spinal stenosis, lumbar region without neurogenic claudication: Secondary | ICD-10-CM | POA: Diagnosis not present

## 2019-12-31 DIAGNOSIS — M48061 Spinal stenosis, lumbar region without neurogenic claudication: Secondary | ICD-10-CM | POA: Diagnosis not present

## 2020-01-03 DIAGNOSIS — M48061 Spinal stenosis, lumbar region without neurogenic claudication: Secondary | ICD-10-CM | POA: Diagnosis not present

## 2020-01-03 DIAGNOSIS — M6281 Muscle weakness (generalized): Secondary | ICD-10-CM | POA: Diagnosis not present

## 2020-01-04 DIAGNOSIS — F411 Generalized anxiety disorder: Secondary | ICD-10-CM | POA: Diagnosis not present

## 2020-01-05 DIAGNOSIS — M6281 Muscle weakness (generalized): Secondary | ICD-10-CM | POA: Diagnosis not present

## 2020-01-05 DIAGNOSIS — M48061 Spinal stenosis, lumbar region without neurogenic claudication: Secondary | ICD-10-CM | POA: Diagnosis not present

## 2020-01-10 DIAGNOSIS — M48061 Spinal stenosis, lumbar region without neurogenic claudication: Secondary | ICD-10-CM | POA: Diagnosis not present

## 2020-01-10 DIAGNOSIS — M6281 Muscle weakness (generalized): Secondary | ICD-10-CM | POA: Diagnosis not present

## 2020-01-13 DIAGNOSIS — M48061 Spinal stenosis, lumbar region without neurogenic claudication: Secondary | ICD-10-CM | POA: Diagnosis not present

## 2020-01-13 DIAGNOSIS — M6281 Muscle weakness (generalized): Secondary | ICD-10-CM | POA: Diagnosis not present

## 2020-01-18 DIAGNOSIS — F411 Generalized anxiety disorder: Secondary | ICD-10-CM | POA: Diagnosis not present

## 2020-01-19 DIAGNOSIS — M48061 Spinal stenosis, lumbar region without neurogenic claudication: Secondary | ICD-10-CM | POA: Diagnosis not present

## 2020-01-19 DIAGNOSIS — M6281 Muscle weakness (generalized): Secondary | ICD-10-CM | POA: Diagnosis not present

## 2020-01-24 DIAGNOSIS — M9901 Segmental and somatic dysfunction of cervical region: Secondary | ICD-10-CM | POA: Diagnosis not present

## 2020-01-24 DIAGNOSIS — M50322 Other cervical disc degeneration at C5-C6 level: Secondary | ICD-10-CM | POA: Diagnosis not present

## 2020-01-24 DIAGNOSIS — M545 Low back pain: Secondary | ICD-10-CM | POA: Diagnosis not present

## 2020-01-24 DIAGNOSIS — M5417 Radiculopathy, lumbosacral region: Secondary | ICD-10-CM | POA: Diagnosis not present

## 2020-01-24 DIAGNOSIS — M9902 Segmental and somatic dysfunction of thoracic region: Secondary | ICD-10-CM | POA: Diagnosis not present

## 2020-01-24 DIAGNOSIS — M9903 Segmental and somatic dysfunction of lumbar region: Secondary | ICD-10-CM | POA: Diagnosis not present

## 2020-01-24 DIAGNOSIS — M5137 Other intervertebral disc degeneration, lumbosacral region: Secondary | ICD-10-CM | POA: Diagnosis not present

## 2020-01-24 DIAGNOSIS — M542 Cervicalgia: Secondary | ICD-10-CM | POA: Diagnosis not present

## 2020-01-26 ENCOUNTER — Ambulatory Visit (INDEPENDENT_AMBULATORY_CARE_PROVIDER_SITE_OTHER): Payer: Medicare Other | Admitting: Family Medicine

## 2020-01-26 ENCOUNTER — Encounter: Payer: Self-pay | Admitting: Family Medicine

## 2020-01-26 ENCOUNTER — Other Ambulatory Visit: Payer: Self-pay

## 2020-01-26 VITALS — BP 132/84 | HR 107 | Temp 97.3°F | Resp 16 | Ht 72.0 in | Wt 228.9 lb

## 2020-01-26 DIAGNOSIS — Z87442 Personal history of urinary calculi: Secondary | ICD-10-CM | POA: Diagnosis not present

## 2020-01-26 DIAGNOSIS — Z23 Encounter for immunization: Secondary | ICD-10-CM

## 2020-01-26 DIAGNOSIS — R1031 Right lower quadrant pain: Secondary | ICD-10-CM

## 2020-01-26 NOTE — Progress Notes (Signed)
Name: Melissa [name]   MRN: [id number]    DOB: 11-16-1950   Date:01/26/2020       Progress Note  Subjective  Chief Complaint  Chief Complaint  Patient presents with  . Abdominal Pain    She has had lower abdominal cramping on and off. She has been evaluated for this before.    HPI  RLQ pain: she noticed recurrence of RLQ pain over the past few days, the pain is described as cramping, severe when present, associated with some bladder fullness but no dysuria or hematuria. She states it feels like her previous kidney stone. She denies nausea, vomiting or fever. No bulging noticed. When the pain is present she feels like she needs to have a bowel movement. States constipation under control with prunes. No blood or mucus in her stools.     Patient Active Problem List   Diagnosis Date Noted  . COVID-19 virus infection 09/06/2019  . Localized swelling of right lower leg 09/06/2019  . OSA on CPAP 09/06/2019  . Secondary hyperparathyroidism of renal origin ([place]) 07/21/2019  . Anemia in chronic kidney disease 06/14/2019  . Benign hypertensive kidney disease with chronic kidney disease 06/14/2019  . Sickle cell trait ([place]) 06/14/2019  . Chronic kidney disease, stage 3b 06/14/2019  . Chronic bronchitis ([place]) 05/19/2019  . Chronic venous insufficiency 04/15/2019  . Lymphedema 04/15/2019  . Swelling of limb 03/30/2019  . Family history of colon cancer   . Trigeminal neuralgia 09/11/2018  . Cardiac LV ejection fraction of 40-49% 05/07/2017  . Low back pain without sciatica 02/21/2017  . Renal cyst 10/18/2016  . Atherosclerosis of aorta ([place]) 10/16/2016  . History of colonic polyps 09/25/2016  . Dyslipidemia 12/08/2015  . Gastroesophageal reflux disease without esophagitis 08/11/2015  . Primary osteoarthritis of knee 06/13/2015  . Hip bursitis 06/13/2015  . RLS (restless legs syndrome) 06/13/2015  . Depression, major, in remission ([place]) 06/13/2015  . B12 deficiency 06/13/2015  . History  of Roux-en-Y gastric bypass 06/13/2015  . History of iron deficiency anemia 06/13/2015  . Vitamin D deficiency 06/13/2015  . Hypertension, benign 06/13/2015  . Perennial allergic rhinitis with seasonal variation 06/13/2015  . Obesity (BMI 30.0-34.9) 06/13/2015  . Cervical radiculitis 01/04/2014  . Cervical spinal stenosis 01/04/2014  . Cervical osteoarthritis 01/04/2014    Past Surgical History:  Procedure Laterality Date  . BARIATRIC SURGERY    . CESAREAN SECTION     3 or more  . COLONOSCOPY WITH PROPOFOL N/A 03/15/2019   Procedure: COLONOSCOPY WITH PROPOFOL;  Surgeon: [name], MD;  [facility];  Service: Gastroenterology;  Laterality: N/A;  . GASTRIC BYPASS    . TUBAL LIGATION      Family History  Problem Relation Age of Onset  . Hypercholesterolemia Mother   . Heart disease Mother   . Hypertension Mother   . Alcohol abuse Father   . Lung cancer Brother   . Alcohol abuse Brother   . Diabetes Mellitus II Sister   . Hypertension Maternal Grandmother   . Colon cancer Sister   . Breast cancer Neg Hx     Social History   Tobacco Use  . Smoking status: Never Smoker  . Smokeless tobacco: Never Used  Substance Use Topics  . Alcohol use: Not Currently    Alcohol/week: 0.0 standard drinks     Current Outpatient Medications:  .  albuterol (VENTOLIN HFA) 108 (90 Base) MCG/ACT inhaler, Inhale 2 puffs into the lungs every 6 (six) hours as needed  for wheezing or shortness of breath., Disp: 18 g, Rfl: 0 .  ASHWAGANDHA PO, Take by mouth., Disp: , Rfl:  .  aspirin 81 MG EC tablet, TAKE 1 TABLET BY MOUTH EVERY DAY, Disp: 30 tablet, Rfl: 0 .  atorvastatin (LIPITOR) 40 MG tablet, TAKE 1 TABLET BY MOUTH EVERY DAY, Disp: 90 tablet, Rfl: 1 .  azelastine (ASTELIN) 0.1 % nasal spray, 2 SPRAYS IN EACH NOSTRIL DAILY, Disp: 90 mL, Rfl: 0 .  BLACK CURRANT SEED OIL PO, Take by mouth., Disp: , Rfl:  .  budesonide (PULMICORT) 0.25 MG/2ML nebulizer solution, Take 0.25 mg  by nebulization 2 (two) times daily. PRN only, Disp: , Rfl:  .  carbamazepine (TEGRETOL) 200 MG tablet, TAKE 1 TABLET (200 MG TOTAL) BY MOUTH 3 (THREE) TIMES DAILY., Disp: 270 tablet, Rfl: 1 .  Cholecalciferol (VITAMIN D3 ADULT GUMMIES PO), Take 2 each by mouth daily. , Disp: , Rfl:  .  Docusate Sodium (DSS) 100 MG CAPS, Take by mouth., Disp: , Rfl:  .  ferrous sulfate (IRON SUPPLEMENT) 325 (65 FE) MG tablet, Take 325 mg by mouth daily with breakfast., Disp: , Rfl:  .  fluticasone (FLONASE) 50 MCG/ACT nasal spray, SPRAY 2 SPRAYS INTO EACH NOSTRIL EVERY DAY, Disp: 48 mL, Rfl: 1 .  gabapentin (NEURONTIN) 100 MG capsule, Take 100 mg by mouth at bedtime., Disp: , Rfl:  .  loratadine (CLARITIN) 10 MG tablet, Take 1 tablet (10 mg total) by mouth daily., Disp: 90 tablet, Rfl: 0 .  losartan (COZAAR) 25 MG tablet, TAKE 1 TABLET BY MOUTH EVERY DAY, Disp: 90 tablet, Rfl: 0 .  magnesium oxide (MAG-OX) 400 MG tablet, Take 400 mg by mouth daily. Reported on 12/08/2015, Disp: , Rfl:  .  metaxalone (SKELAXIN) 800 MG tablet, Take 400-800 mg by mouth 2 (two) times daily as needed., Disp: , Rfl:  .  methocarbamol (ROBAXIN) 750 MG tablet, Take 750 mg by mouth 2 (two) times daily., Disp: , Rfl:  .  Multiple Vitamins-Minerals (CENTRUM SILVER) tablet, Take 1 tablet by mouth daily., Disp: 30 tablet, Rfl: 0 .  omeprazole (PRILOSEC) 20 MG capsule, TAKE 1 CAPSULE BY MOUTH EVERY DAY, Disp: 90 capsule, Rfl: 3 .  TURMERIC PO, Take by mouth., Disp: , Rfl:   Allergies  Allergen Reactions  . Contrast Media [Iodinated Diagnostic Agents]   . Shellfish Allergy     Edema    I personally reviewed active problem list, medication list, allergies, family history, social history, health maintenance with the patient/caregiver today.   ROS  Ten systems reviewed and is negative except as mentioned in HPI   Objective  Vitals:   01/26/20 1107  BP: 132/84  Pulse: (!) 107  Resp: 16  Temp: (!) 97.3 F (36.3 C)  TempSrc:  Temporal  SpO2: 98%  Weight: 228 lb 14.4 oz (103.8 kg)  Height: 6' (1.829 m)    Body mass index is 31.04 kg/m.  Physical Exam  Constitutional: Patient appears well-developed and well-nourished. Obese No distress.  HEENT: head atraumatic, normocephalic, pupils equal and reactive to light, neck supple Cardiovascular: Normal rate, regular rhythm and normal heart sounds.  No murmur heard. No BLE edema. Pulmonary/Chest: Effort normal and breath sounds normal. No respiratory distress. Abdominal: Soft.  There is no tenderness, negative CVA tenderness  Psychiatric: Patient has a normal mood and affect. behavior is normal. Judgment and thought content normal.  PHQ2/9: Depression screen [facility] 2/9 01/26/2020 10/08/2019 10/01/2019 09/23/2019 09/21/2019  Decreased Interest 0 0 0 0 0  Down, Depressed, Hopeless 0 0 0 0 0  PHQ - 2 Score 0 0 0 0 0  Altered sleeping 0 - 0 0 0  Tired, decreased energy 0 - 1 1 1   Change in appetite 0 - 0 0 0  Feeling bad or failure about yourself  0 - 0 0 0  Trouble concentrating 0 - 0 0 0  Moving slowly or fidgety/restless 0 - 0 0 0  Suicidal thoughts 0 - 0 0 0  PHQ-9 Score 0 - 1 1 1   Difficult doing work/chores - - Not difficult at all Not difficult at all Not difficult at all  Some recent data might be hidden    phq 9 is negative   Fall Risk: Fall Risk  01/26/2020 10/08/2019 10/01/2019 09/23/2019 09/21/2019  Falls in the past year? 1 1 1 1 1   Comment - - - - August 2020  Number falls in past yr: 0 1 0 0 0  Injury with Fall? 1 1 1 1 1   Comment - fracture of pinky finger on right hand - - Fractured pinky  Risk for fall due to : - History of fall(s) - - -  Follow up - Falls prevention discussed - - -     Functional Status Survey: Is the patient deaf or have difficulty hearing?: No Does the patient have difficulty seeing, even when wearing glasses/contacts?: No Does the patient have difficulty concentrating, remembering, or making decisions?: No Does the patient have  difficulty walking or climbing stairs?: No Does the patient have difficulty dressing or bathing?: No Does the patient have difficulty doing errands alone such as visiting a doctor's office or shopping?: No    Assessment & Plan  1. History of kidney stones  - CT RENAL STONE STUDY; Future - Ambulatory referral to Urology  2. Need for 23-polyvalent pneumococcal polysaccharide vaccine  - Pneumococcal polysaccharide vaccine 23-valent greater than or equal to 2yo subcutaneous/IM  3. Right lower quadrant pain  - CT RENAL STONE STUDY; Future - Ambulatory referral to Urology - CULTURE, URINE COMPREHENSIVE

## 2020-01-27 LAB — CULTURE, URINE COMPREHENSIVE
MICRO NUMBER:: 10574795
SPECIMEN QUALITY:: ADEQUATE

## 2020-01-28 DIAGNOSIS — M50322 Other cervical disc degeneration at C5-C6 level: Secondary | ICD-10-CM | POA: Diagnosis not present

## 2020-01-28 DIAGNOSIS — M5137 Other intervertebral disc degeneration, lumbosacral region: Secondary | ICD-10-CM | POA: Diagnosis not present

## 2020-01-28 DIAGNOSIS — M9902 Segmental and somatic dysfunction of thoracic region: Secondary | ICD-10-CM | POA: Diagnosis not present

## 2020-01-28 DIAGNOSIS — M9901 Segmental and somatic dysfunction of cervical region: Secondary | ICD-10-CM | POA: Diagnosis not present

## 2020-01-28 DIAGNOSIS — M5417 Radiculopathy, lumbosacral region: Secondary | ICD-10-CM | POA: Diagnosis not present

## 2020-01-28 DIAGNOSIS — M545 Low back pain: Secondary | ICD-10-CM | POA: Diagnosis not present

## 2020-01-28 DIAGNOSIS — M9903 Segmental and somatic dysfunction of lumbar region: Secondary | ICD-10-CM | POA: Diagnosis not present

## 2020-01-28 DIAGNOSIS — M542 Cervicalgia: Secondary | ICD-10-CM | POA: Diagnosis not present

## 2020-01-31 ENCOUNTER — Other Ambulatory Visit: Payer: Self-pay | Admitting: Family Medicine

## 2020-01-31 DIAGNOSIS — I700 Atherosclerosis of aorta: Secondary | ICD-10-CM

## 2020-02-08 ENCOUNTER — Ambulatory Visit
Admission: RE | Admit: 2020-02-08 | Discharge: 2020-02-08 | Disposition: A | Discharge status: Home / Self Care | Payer: Medicare Other | Source: Ambulatory Visit | Attending: Family Medicine | Admitting: Family Medicine

## 2020-02-08 ENCOUNTER — Other Ambulatory Visit: Payer: Self-pay

## 2020-02-08 DIAGNOSIS — R1031 Right lower quadrant pain: Secondary | ICD-10-CM | POA: Insufficient documentation

## 2020-02-08 DIAGNOSIS — Z87442 Personal history of urinary calculi: Secondary | ICD-10-CM | POA: Insufficient documentation

## 2020-02-11 ENCOUNTER — Encounter: Payer: Self-pay | Admitting: Family Medicine

## 2020-02-11 DIAGNOSIS — I728 Aneurysm of other specified arteries: Secondary | ICD-10-CM | POA: Insufficient documentation

## 2020-02-11 DIAGNOSIS — M5417 Radiculopathy, lumbosacral region: Secondary | ICD-10-CM | POA: Diagnosis not present

## 2020-02-11 DIAGNOSIS — M542 Cervicalgia: Secondary | ICD-10-CM | POA: Diagnosis not present

## 2020-02-11 DIAGNOSIS — M50322 Other cervical disc degeneration at C5-C6 level: Secondary | ICD-10-CM | POA: Diagnosis not present

## 2020-02-11 DIAGNOSIS — M9903 Segmental and somatic dysfunction of lumbar region: Secondary | ICD-10-CM | POA: Diagnosis not present

## 2020-02-11 DIAGNOSIS — M9902 Segmental and somatic dysfunction of thoracic region: Secondary | ICD-10-CM | POA: Diagnosis not present

## 2020-02-11 DIAGNOSIS — M5137 Other intervertebral disc degeneration, lumbosacral region: Secondary | ICD-10-CM | POA: Diagnosis not present

## 2020-02-11 DIAGNOSIS — M545 Low back pain: Secondary | ICD-10-CM | POA: Diagnosis not present

## 2020-02-11 DIAGNOSIS — M9901 Segmental and somatic dysfunction of cervical region: Secondary | ICD-10-CM | POA: Diagnosis not present

## 2020-02-14 NOTE — Telephone Encounter (Signed)
Sarah please advise on patient mychart message  At one point I was prescribed Qvar twice a day. I am not taking it at the present time. Is this something (or something similar) that I should be taking daily. If so could you please send a prescription for me? Thank you

## 2020-02-15 ENCOUNTER — Encounter: Payer: Self-pay | Admitting: Family Medicine

## 2020-02-15 DIAGNOSIS — I700 Atherosclerosis of aorta: Secondary | ICD-10-CM

## 2020-02-15 MED ORDER — ATORVASTATIN CALCIUM 40 MG PO TABS: 40.0000 mg | ORAL_TABLET | Freq: Every day | ORAL | 1 refills | Status: DC

## 2020-02-16 DIAGNOSIS — M545 Low back pain: Secondary | ICD-10-CM | POA: Diagnosis not present

## 2020-02-16 DIAGNOSIS — M9903 Segmental and somatic dysfunction of lumbar region: Secondary | ICD-10-CM | POA: Diagnosis not present

## 2020-02-16 DIAGNOSIS — M5137 Other intervertebral disc degeneration, lumbosacral region: Secondary | ICD-10-CM | POA: Diagnosis not present

## 2020-02-16 DIAGNOSIS — M9902 Segmental and somatic dysfunction of thoracic region: Secondary | ICD-10-CM | POA: Diagnosis not present

## 2020-02-16 DIAGNOSIS — M50322 Other cervical disc degeneration at C5-C6 level: Secondary | ICD-10-CM | POA: Diagnosis not present

## 2020-02-16 DIAGNOSIS — M542 Cervicalgia: Secondary | ICD-10-CM | POA: Diagnosis not present

## 2020-02-16 DIAGNOSIS — M5417 Radiculopathy, lumbosacral region: Secondary | ICD-10-CM | POA: Diagnosis not present

## 2020-02-16 DIAGNOSIS — M9901 Segmental and somatic dysfunction of cervical region: Secondary | ICD-10-CM | POA: Diagnosis not present

## 2020-02-19 NOTE — Progress Notes (Signed)
02/22/2020 4:54 PM   Melissa [name] [birth year] [id number]  Referring provider: Steele Sizer, MD [address] [place],  [place] [address] Chief Complaint  Patient presents with  . Nephrolithiasis    New Patient    HPI: Melissa [name] is a 69 y.o. female with a known history of kidney stones and right lower quadrant pain presents today for evaluation and management.    Patient was seen by her PCP on 01/26/2020. She noted on and off RLQ abdominal cramping x 3 days prior to visit. She had associated bladder fullness but no dysuria or hematuria. She stated it felt like her previous kidney stone. When the pain was present she felt like she needed to have a bowel movement. Stated constipation under control with prunes. She denied nausea, vomiting or fever. No bulging noticed. No blood or mucus in her stools. Patient was referred to urology.   Urine culture on 01/26/2020 showed 10,000-49,000 CFU/mL of gram positive cocci isolated.   Renal CT on 02/08/2020 revealed no acute abdominopelvic findings. Punctate nonobstructing right renal calculus. No hydronephrosis. Redemonstration of multiple rounded lesions within the bilateral kidneys of varying size and attenuation suggesting multiple complex bilateral renal cysts. No appreciable interval change from prior CT 10/18/2016.   She continues to have faint RLQ abdominal pain.  The pain comes and goes without any alleviating or exacerbating factors.  It is worse on some days than others.  She has no dysuria or burning during urination. She has a known history of kidney stones.  She has constant constipation that is managed with medication.  She has hip problems. She has abnormal ambulation and posture related to back pain. The patient is able to perform ADLs without assistance.    PMH: Past Medical History:  Diagnosis Date  . Bruising   . Cervical radiculopathy   . Deficiency of vitamin B   . Depression   . Edema   .  Flank pain   . Gross hematuria   . HTN (hypertension)   . Iron deficiency anemia   . Leukopenia   . OA (osteoarthritis)   . Obesity   . OSA on CPAP   . Osteopenia   . Perennial allergic rhinitis   . Renal cysts, acquired, bilateral   . RLS (restless legs syndrome)   . Sickle cell trait ([place])   . Snoring   . Spinal stenosis   . Trigeminal neuralgia     Surgical History: Past Surgical History:  Procedure Laterality Date  . BARIATRIC SURGERY    . CESAREAN SECTION     3 or more  . COLONOSCOPY WITH PROPOFOL N/A 03/15/2019   Procedure: COLONOSCOPY WITH PROPOFOL;  Surgeon: [name], MD;  Location: [facility] ENDOSCOPY;  Service: Gastroenterology;  Laterality: N/A;  . GASTRIC BYPASS    . SHOULDER ARTHROSCOPY Right   . TUBAL LIGATION      Home Medications:  Allergies as of 02/22/2020      Reactions   Contrast Media [iodinated Diagnostic Agents]    Shellfish Allergy    Edema      Medication List       Accurate as of February 22, 2020  4:54 PM. If you have any questions, ask your nurse or doctor.        STOP taking these medications   gabapentin 100 MG capsule Commonly known as: NEURONTIN Stopped by: Hollice Espy, MD   methocarbamol 750 MG tablet Commonly known as: ROBAXIN Stopped by: Hollice Espy, MD  TAKE these medications   albuterol 108 (90 Base) MCG/ACT inhaler Commonly known as: VENTOLIN HFA Inhale 2 puffs into the lungs every 6 (six) hours as needed for wheezing or shortness of breath.   ASHWAGANDHA PO Take by mouth.   aspirin 81 MG EC tablet TAKE 1 TABLET BY MOUTH EVERY DAY   atorvastatin 40 MG tablet Commonly known as: LIPITOR Take 1 tablet (40 mg total) by mouth daily.   azelastine 0.1 % nasal spray Commonly known as: ASTELIN 2 SPRAYS IN EACH NOSTRIL DAILY   BLACK CURRANT SEED OIL PO Take by mouth.   budesonide 0.25 MG/2ML nebulizer solution Commonly known as: PULMICORT Take 0.25 mg by nebulization 2 (two) times daily. PRN only     carbamazepine 200 MG tablet Commonly known as: TEGRETOL TAKE 1 TABLET (200 MG TOTAL) BY MOUTH 3 (THREE) TIMES DAILY.   Centrum Silver tablet Take 1 tablet by mouth daily.   DSS 100 MG Caps Take by mouth.   fluticasone 50 MCG/ACT nasal spray Commonly known as: FLONASE SPRAY 2 SPRAYS INTO EACH NOSTRIL EVERY DAY   Iron Supplement 325 (65 FE) MG tablet Generic drug: ferrous sulfate Take 325 mg by mouth daily with breakfast.   loratadine 10 MG tablet Commonly known as: CLARITIN Take 1 tablet (10 mg total) by mouth daily.   losartan 25 MG tablet Commonly known as: COZAAR TAKE 1 TABLET BY MOUTH EVERY DAY   magnesium oxide 400 MG tablet Commonly known as: MAG-OX Take 400 mg by mouth daily. Reported on 12/08/2015   metaxalone 800 MG tablet Commonly known as: SKELAXIN Take 400-800 mg by mouth 2 (two) times daily as needed.   omeprazole 20 MG capsule Commonly known as: PRILOSEC TAKE 1 CAPSULE BY MOUTH EVERY DAY   TURMERIC PO Take by mouth.   VITAMIN D3 ADULT GUMMIES PO Take 2 each by mouth daily.       Allergies:  Allergies  Allergen Reactions  . Contrast Media [Iodinated Diagnostic Agents]   . Shellfish Allergy     Edema    Family History: Family History  Problem Relation Age of Onset  . Hypercholesterolemia Mother   . Heart disease Mother   . Hypertension Mother   . Alcohol abuse Father   . Lung cancer Brother   . Alcohol abuse Brother   . Diabetes Mellitus II Sister   . Hypertension Maternal Grandmother   . Colon cancer Sister   . Breast cancer Neg Hx   . Prostate cancer Neg Hx   . Bladder Cancer Neg Hx   . Kidney cancer Neg Hx     Social History:  reports that she has never smoked. She has never used smokeless tobacco. She reports previous alcohol use. She reports that she does not use drugs.   Physical Exam: BP 128/80   Pulse (!) 103   Ht 6' (1.829 m)   Wt 225 lb (102.1 kg)   BMI 30.52 kg/m   Constitutional:  Alert and oriented, No acute  distress. HEENT: [place] AT, moist mucus membranes.  Trachea midline, no masses. Cardiovascular: No clubbing, cyanosis, or edema. Respiratory: Normal respiratory effort, no increased work of breathing. Skin: No rashes, bruises or suspicious lesions. Neurologic: Grossly intact, no focal deficits, moving all 4 extremities. Psychiatric: Normal mood and affect.   Urinalysis Negative  Pertinent Imaging:  Results for orders placed during the hospital encounter of 02/08/20  CT RENAL STONE STUDY  Narrative CLINICAL DATA:  Intermittent back pain and right lower quadrant pain  EXAM: CT ABDOMEN AND PELVIS WITHOUT CONTRAST  TECHNIQUE: Multidetector CT imaging of the abdomen and pelvis was performed following the standard protocol without IV contrast.  COMPARISON:  10/18/2016  FINDINGS: Lower chest: Stable bibasilar scarring. Heart size without pericardial effusion.  Hepatobiliary: No focal liver lesion identified. Fluid-fluid level within the gallbladder lumen suggesting biliary sludge. No hyperdense gallstone. No biliary dilatation.  Pancreas: Unremarkable. No pancreatic ductal dilatation or surrounding inflammatory changes.  Spleen: Normal in size without focal abnormality.  Adrenals/Urinary Tract: Unremarkable adrenal glands. Redemonstration of multiple rounded lesions within the bilateral kidneys of varying size and attenuation suggesting multiple complex bilateral renal cysts. Punctate 1-2 mm calculus within the superior calyx of the right kidney. Unchanged cortically based calcifications within both kidneys. No hydronephrosis. No ureteral calculi. Numerous pelvic phleboliths. Urinary bladder appears within normal limits for the degree of distension.  Stomach/Bowel: Sequela of prior gastric bypass. Appendix appears normal (series 3, image 48). Scattered colonic diverticulosis. No evidence of bowel wall thickening, distention, or inflammatory changes.  Vascular/Lymphatic:  Scattered atherosclerotic calcification the aortoiliac axis. Stable aneurysmal dilatation the mid artery measuring 15 mm in diameter (series 3, image 22). No abdominal aortic aneurysm. No abdominopelvic lymphadenopathy.  Reproductive: Uterus and bilateral adnexa are unremarkable.  Other: Tiny fat containing umbilical.  No free air or free fluid.  Musculoskeletal: Degenerative changes of the bilateral hips and lower lumbar spine.  IMPRESSION: 1. No acute abdominopelvic findings. 2. Punctate nonobstructing right renal calculus. No hydronephrosis. 3. Redemonstration of multiple rounded lesions within the bilateral kidneys of varying size and attenuation suggesting multiple complex bilateral renal cysts. No appreciable interval change from prior CT 10/18/2016. 4. Fluid-fluid level within the gallbladder lumen suggesting biliary sludge. No CT evidence to suggest cholecystitis. 5. 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 6. Scattered colonic diverticulosis without evidence of acute diverticulitis. 7. Aortic atherosclerosis. (ICD10-I70.0).   Electronically Signed By: Davina Poke D.O. On: 02/09/2020 09:56   I have personally reviewed the images and agree with radiologist interpretation.    Assessment & Plan:    1. Bilateral Nephrolithiasis  Bilateral nonobstructing stones, left greater than right Unlikely related to current presentation, see below  She has a known history of kidney stones UA was negative today Would not recommend any intervention for these relatively small stones, will observe We discussed general stone prevention techniques including drinking plenty water with goal of producing 2.5 L urine daily, increased citric acid intake, avoidance of high oxalate containing foods, and decreased salt intake.  Information about dietary recommendations given today.   2. Constipation and bowel issues Managed with  medication  3.  Right lower quadrant pain Given the absence of any obstructing stones and negative urinalysis as well as the location nature of her stones, suspect  pain is not likely GU related Advised her to follow-up with her PCP to continue further evaluation of this  4. Bilateral Renal lesions Consistent with complex cyst Overall stable in size and location dating back to renal ultrasound/CT in 2018 which was personally reviewed for comparison today Given the stability, risk of malignancy is quite low and relatively small in size thus would recommend continue conservative management Will be reasonable to continue to follow these lesions with ultrasound Repeat ultrasound 2 years    [facility] [address] [place], [place] [address] [phone]  I, Selena Batten, am acting as a scribe for Dr. Hollice Espy.  I have reviewed the  above documentation for accuracy and completeness, and I agree with the above.   Hollice Espy, MD

## 2020-02-22 ENCOUNTER — Ambulatory Visit (INDEPENDENT_AMBULATORY_CARE_PROVIDER_SITE_OTHER): Payer: Medicare Other | Admitting: Urology

## 2020-02-22 ENCOUNTER — Encounter: Payer: Self-pay | Admitting: Urology

## 2020-02-22 ENCOUNTER — Other Ambulatory Visit: Payer: Self-pay

## 2020-02-22 VITALS — BP 128/80 | HR 103 | Ht 72.0 in | Wt 225.0 lb

## 2020-02-22 DIAGNOSIS — N200 Calculus of kidney: Secondary | ICD-10-CM | POA: Diagnosis not present

## 2020-02-23 ENCOUNTER — Encounter: Payer: Self-pay | Admitting: Family Medicine

## 2020-02-23 LAB — URINALYSIS, COMPLETE
Bilirubin, UA: NEGATIVE
Glucose, UA: NEGATIVE
Ketones, UA: NEGATIVE
Leukocytes,UA: NEGATIVE
Nitrite, UA: NEGATIVE
Protein,UA: NEGATIVE
Specific Gravity, UA: 1.01 (ref 1.005–1.030)
Urobilinogen, Ur: 0.2 mg/dL (ref 0.2–1.0)
pH, UA: 5.5 (ref 5.0–7.5)

## 2020-02-23 LAB — MICROSCOPIC EXAMINATION

## 2020-02-25 DIAGNOSIS — M50322 Other cervical disc degeneration at C5-C6 level: Secondary | ICD-10-CM | POA: Diagnosis not present

## 2020-02-25 DIAGNOSIS — M9903 Segmental and somatic dysfunction of lumbar region: Secondary | ICD-10-CM | POA: Diagnosis not present

## 2020-02-25 DIAGNOSIS — M545 Low back pain: Secondary | ICD-10-CM | POA: Diagnosis not present

## 2020-02-25 DIAGNOSIS — M9901 Segmental and somatic dysfunction of cervical region: Secondary | ICD-10-CM | POA: Diagnosis not present

## 2020-02-25 DIAGNOSIS — M5417 Radiculopathy, lumbosacral region: Secondary | ICD-10-CM | POA: Diagnosis not present

## 2020-02-25 DIAGNOSIS — M5137 Other intervertebral disc degeneration, lumbosacral region: Secondary | ICD-10-CM | POA: Diagnosis not present

## 2020-02-25 DIAGNOSIS — M9902 Segmental and somatic dysfunction of thoracic region: Secondary | ICD-10-CM | POA: Diagnosis not present

## 2020-02-25 DIAGNOSIS — M542 Cervicalgia: Secondary | ICD-10-CM | POA: Diagnosis not present

## 2020-02-27 ENCOUNTER — Other Ambulatory Visit: Payer: Self-pay | Admitting: Family Medicine

## 2020-02-27 DIAGNOSIS — I10 Essential (primary) hypertension: Secondary | ICD-10-CM

## 2020-02-27 NOTE — Telephone Encounter (Signed)
Requested medication (s) are due for refill today: yes  Requested medication (s) are on the active medication list: yes  Last refill:  12/03/19  Future visit scheduled: no  Notes to clinic:  HTN addressed 03/18/19- could not find a later visit-   Requested Prescriptions  Pending Prescriptions Disp Refills   losartan (COZAAR) 25 MG tablet [Pharmacy Med Name: LOSARTAN POTASSIUM 25 MG TAB] 90 tablet 0    Sig: TAKE 1 TABLET BY MOUTH EVERY DAY      Cardiovascular:  Angiotensin Receptor Blockers Failed - 02/27/2020 12:20 PM      Failed - Cr in normal range and within 180 days    Creat  Date Value Ref Range Status  11/27/2018 1.50 (H) 0.50 - 0.99 mg/dL Final    Comment:    For patients >15 years of age, the reference limit for Creatinine is approximately 13% higher for people identified as African-American. .    Creatinine, Ser  Date Value Ref Range Status  05/21/2019 1.46 (H) 0.44 - 1.00 mg/dL Final          Failed - K in normal range and within 180 days    Potassium  Date Value Ref Range Status  05/21/2019 4.2 3.5 - 5.1 mmol/L Final  04/15/2014 4.4 3.5 - 5.1 mmol/L Final          Passed - Patient is not pregnant      Passed - Last BP in normal range    BP Readings from Last 1 Encounters:  02/22/20 128/80          Passed - Valid encounter within last 6 months    Recent Outpatient Visits           1 month ago History of kidney stones   [facility] Steele Sizer, MD   4 months ago Sinus pressure   [facility] Steele Sizer, MD   5 months ago Trigeminal neuralgia of left side of face   [facility] Steele Sizer, MD   5 months ago Right lower quadrant pain   [facility] Steele Sizer, MD   7 months ago Atherosclerosis of aorta [facility])   [facility] Steele Sizer, MD       Future Appointments             In 2 weeks Gollan, Kathlene November, MD [facility]   In 2 months Flora Lipps, MD [place]   In 7 months  [facility], [facility]

## 2020-02-28 NOTE — Telephone Encounter (Signed)
Per PCP   Please send routine meds to PCP per previously discussion

## 2020-02-29 MED ORDER — QVAR REDIHALER 40 MCG/ACT IN AERB
1.0000 | INHALATION_SPRAY | Freq: Two times a day (BID) | RESPIRATORY_TRACT | 2 refills | Status: DC
Start: 2020-02-29 — End: 2020-05-10

## 2020-02-29 NOTE — Telephone Encounter (Signed)
Pt is requesting a Rx for qvar, however I do not see this medication mentioned in last OV note.  Per our records it appears that pt was prescribed both qvar 40 and 39mcg in 2018 and 2019.  Dr. Mortimer Fries, please advise if okay to refill and dosage. thanks

## 2020-03-03 DIAGNOSIS — M9901 Segmental and somatic dysfunction of cervical region: Secondary | ICD-10-CM | POA: Diagnosis not present

## 2020-03-03 DIAGNOSIS — M545 Low back pain: Secondary | ICD-10-CM | POA: Diagnosis not present

## 2020-03-03 DIAGNOSIS — M9902 Segmental and somatic dysfunction of thoracic region: Secondary | ICD-10-CM | POA: Diagnosis not present

## 2020-03-03 DIAGNOSIS — M542 Cervicalgia: Secondary | ICD-10-CM | POA: Diagnosis not present

## 2020-03-03 DIAGNOSIS — M5137 Other intervertebral disc degeneration, lumbosacral region: Secondary | ICD-10-CM | POA: Diagnosis not present

## 2020-03-03 DIAGNOSIS — M5417 Radiculopathy, lumbosacral region: Secondary | ICD-10-CM | POA: Diagnosis not present

## 2020-03-03 DIAGNOSIS — M9903 Segmental and somatic dysfunction of lumbar region: Secondary | ICD-10-CM | POA: Diagnosis not present

## 2020-03-03 DIAGNOSIS — M50322 Other cervical disc degeneration at C5-C6 level: Secondary | ICD-10-CM | POA: Diagnosis not present

## 2020-03-06 ENCOUNTER — Telehealth: Payer: Self-pay

## 2020-03-06 MED ORDER — OMEPRAZOLE 20 MG PO CPDR: DELAYED_RELEASE_CAPSULE | ORAL | 0 refills | Status: DC

## 2020-03-06 NOTE — Telephone Encounter (Signed)
I can refill one more time,  but patient needs to see GI for referral, she needs to be further evaluated

## 2020-03-06 NOTE — Telephone Encounter (Signed)
Lm for pt

## 2020-03-06 NOTE — Telephone Encounter (Signed)
Pt is requesting a refill on prilosec 20mg . This medication was last prescribed Dr. Ashby Dawes 04/30/2019. I do not see this medication noted in last OV.  Dr. Mortimer Fries please advise if okay to refill. Thanks

## 2020-03-06 NOTE — Telephone Encounter (Signed)
Spoke to pt and relayed below message.  Pt will contact Dr. Verl Blalock for OV, as she has seen him previously.  Rx for Prilosec has been sent to preferred. Nothing further is needed.

## 2020-03-07 ENCOUNTER — Encounter: Payer: Self-pay | Admitting: Family Medicine

## 2020-03-08 ENCOUNTER — Ambulatory Visit: Payer: Self-pay

## 2020-03-08 NOTE — Telephone Encounter (Signed)
Pt. Reports she started having swelling to left side of face yesterday. Around her eye and cheek. States "this happened this past February and went away." Has pain with this - 4/10. Tylenol helps. No redness, rash or fever. No tooth pain. Appointment for tomorrow. Instructed if symptoms worsen go to ED. Verbalizes understanding.   Reason for Disposition . Swelling is painful to touch  Answer Assessment - Initial Assessment Questions 1. ONSET: "When did the swelling start?" (e.g., minutes, hours, days)     Yesterday 2. LOCATION: "What part of the face is swollen?"     Eye and cheek 3. SEVERITY: "How swollen is it?"     Moderate 4. ITCHING: "Is there any itching?" If Yes, ask: "How much?"   (Scale 1-10; mild, moderate or severe)     No 5. PAIN: "Is the swelling painful to touch?" If Yes, ask: "How painful is it?"   (Scale 1-10; mild, moderate or severe)     4 6. FEVER: "Do you have a fever?" If Yes, ask: "What is it, how was it measured, and when did it start?"      No 7. CAUSE: "What do you think is causing the face swelling?"     Unsure 8. RECURRENT SYMPTOM: "Have you had face swelling before?" If Yes, ask: "When was the last time?" "What happened that time?"     Yes 9. OTHER SYMPTOMS: "Do you have any other symptoms?" (e.g., toothache, leg swelling)     No 10. PREGNANCY: "Is there any chance you are pregnant?" "When was your last menstrual period?"       No  Protocols used: [facility]

## 2020-03-09 ENCOUNTER — Encounter: Payer: Self-pay | Admitting: Internal Medicine

## 2020-03-09 ENCOUNTER — Ambulatory Visit (INDEPENDENT_AMBULATORY_CARE_PROVIDER_SITE_OTHER): Payer: Medicare Other | Admitting: Internal Medicine

## 2020-03-09 ENCOUNTER — Other Ambulatory Visit: Payer: Self-pay

## 2020-03-09 ENCOUNTER — Telehealth: Payer: Self-pay

## 2020-03-09 VITALS — BP 130/90 | HR 83 | Temp 97.9°F | Resp 20 | Ht 72.0 in | Wt 221.7 lb

## 2020-03-09 DIAGNOSIS — G500 Trigeminal neuralgia: Secondary | ICD-10-CM | POA: Diagnosis not present

## 2020-03-09 DIAGNOSIS — R110 Nausea: Secondary | ICD-10-CM | POA: Diagnosis not present

## 2020-03-09 NOTE — Progress Notes (Signed)
Patient ID: Melissa [name], female    DOB: 1951/03/27, 69 y.o.   MRN: [id number]  PCP: Steele Sizer, MD  Chief Complaint  Patient presents with  . Facial Swelling    started originally in 2019, comes and goes   . Nausea    patient has been very nauseous today    Subjective:   Melissa [name] is a 69 y.o. female, presents to clinic with CC of the following:  Chief Complaint  Patient presents with  . Facial Swelling    started originally in 2019, comes and goes   . Nausea    patient has been very nauseous today    HPI:  Patient is a 69 year old female patient of Dr. Ancil Boozer She left a message as follows:  In February I experienced some face pain and swelling in my face. I thought it was TN. I have the same symptoms now. It does not feel like when I was first diagnosis with the TN. My first diagnosis did not respond to pain meds and there was no swelling. Since it took so long for the meds to get in my system the first time I started taking the carbamazepine this afternoon.  She was scheduled for a follow-up visit today Her daughter was with her today.  Patient was seen 09/23/2019 by Dr. Ancil Boozer, diagnosed with trigeminal neuralgia.  Her symptoms from that visit were as follows:  she states she developed a sudden onset of left side facial pain, worse in front of left ear, radiates to temporal area, radiates to neck and also her teeth is very sore. Difficulty opening her mouth and daughter noticed that left side of face seems a little swollen There is no redness or rashes. She feels warm but has not checked her temperature. States mouth is not dry. This pain different than trigeminal neuralgia because it is more constant instead of intermittent.  The Assessment/plan from that visit was as follows:  Atypical symptoms with swelling, advised to try medication but if no improvement to go to [facility].  She was started on carbamazepine 200 mg 3 times daily and given Tylenol with codeine for  as needed use and she will also try to contact her neurologist who she has seen for trigeminal neuralgia  I discussed the assessment and treatment plan with the patient. The patient was provided an opportunity to ask questions and all were answered. The patient agreed with the plan and demonstrated an understanding of the instructions  She then did follow-up with neurology on 09/24/2019, not an in person visit.  It was recommended to continue the carbamazepine product and call the office if her symptoms were not improving. It was also noted  - "we were not able to do thorough physical exam during this televisit. Neurological exam is a crucial part of patient evaluation and lack of it can lead to misdiagnosis or missed diagnosis. If patient has concerns, they should consider making in person appointment with the provider. Provider should not be liable for consequences of lack of in person exam."   On follow-up today, she notes her symptoms started again the night before last, with pain felt on the upper left side of her face in the V2 distribution of the trigeminal nerve.  She noted some used to have more sharp pains every now and then, although has progressed to a dull pain now in this region with intermittent paroxysms of sharp pains in his facial region.  She also notes some  mild swelling.  (Although not as swollen as it was in February).  She denies any problems swallowing, and has had no fevers.  Denies any one-sided symptoms of concern, no facial droop.  No marked increased tearing, no increased postnasal drip.  No decreased vision or blurred vision or double vision.  Denies any hearing loss. She started the carbamazepine again yesterday, took 3 times a day, and 1 dose this morning.  She also took Tylenol with codeine that she had from last time 1 dose this morning.  She now notes she has increased nausea, but has not thrown up. She noted her first episode was in November 2019, did not have another  episode until February when she saw Dr. Ancil Boozer with neurology input received at that time.  Then recurred the night before last.   Patient Active Problem List   Diagnosis Date Noted  . Aneurysm of celiac artery ([place]) 02/11/2020  . COVID-19 virus infection 09/06/2019  . Localized swelling of right lower leg 09/06/2019  . OSA on CPAP 09/06/2019  . Secondary hyperparathyroidism of renal origin ([place]) 07/21/2019  . Anemia in chronic kidney disease 06/14/2019  . Benign hypertensive kidney disease with chronic kidney disease 06/14/2019  . Sickle cell trait ([place]) 06/14/2019  . Chronic kidney disease, stage 3b 06/14/2019  . Chronic bronchitis ([place]) 05/19/2019  . Chronic venous insufficiency 04/15/2019  . Lymphedema 04/15/2019  . Swelling of limb 03/30/2019  . Family history of colon cancer   . Trigeminal neuralgia 09/11/2018  . Cardiac LV ejection fraction of 40-49% 05/07/2017  . Low back pain without sciatica 02/21/2017  . Renal cyst 10/18/2016  . Atherosclerosis of aorta ([place]) 10/16/2016  . History of colonic polyps 09/25/2016  . Dyslipidemia 12/08/2015  . Gastroesophageal reflux disease without esophagitis 08/11/2015  . Primary osteoarthritis of knee 06/13/2015  . Hip bursitis 06/13/2015  . RLS (restless legs syndrome) 06/13/2015  . Depression, major, in remission ([place]) 06/13/2015  . B12 deficiency 06/13/2015  . History of Roux-en-Y gastric bypass 06/13/2015  . History of iron deficiency anemia 06/13/2015  . Vitamin D deficiency 06/13/2015  . Hypertension, benign 06/13/2015  . Perennial allergic rhinitis with seasonal variation 06/13/2015  . Obesity (BMI 30.0-34.9) 06/13/2015  . Cervical radiculitis 01/04/2014  . Cervical spinal stenosis 01/04/2014  . Cervical osteoarthritis 01/04/2014      Current Outpatient Medications:  .  albuterol (VENTOLIN HFA) 108 (90 Base) MCG/ACT inhaler, Inhale 2 puffs into the lungs every 6 (six) hours as needed for wheezing or shortness of breath.,  Disp: 18 g, Rfl: 0 .  ASHWAGANDHA PO, Take by mouth., Disp: , Rfl:  .  aspirin 81 MG EC tablet, TAKE 1 TABLET BY MOUTH EVERY DAY, Disp: 30 tablet, Rfl: 0 .  atorvastatin (LIPITOR) 40 MG tablet, Take 1 tablet (40 mg total) by mouth daily., Disp: 90 tablet, Rfl: 1 .  azelastine (ASTELIN) 0.1 % nasal spray, 2 SPRAYS IN EACH NOSTRIL DAILY, Disp: 90 mL, Rfl: 0 .  beclomethasone (QVAR REDIHALER) 40 MCG/ACT inhaler, Inhale 1 puff into the lungs 2 (two) times daily., Disp: 10.6 g, Rfl: 2 .  BLACK CURRANT SEED OIL PO, Take by mouth., Disp: , Rfl:  .  budesonide (PULMICORT) 0.25 MG/2ML nebulizer solution, Take 0.25 mg by nebulization 2 (two) times daily. PRN only, Disp: , Rfl:  .  carbamazepine (TEGRETOL) 200 MG tablet, TAKE 1 TABLET (200 MG TOTAL) BY MOUTH 3 (THREE) TIMES DAILY., Disp: 270 tablet, Rfl: 1 .  Cholecalciferol (VITAMIN D3 ADULT GUMMIES PO),  Take 2 each by mouth daily. , Disp: , Rfl:  .  Docusate Sodium (DSS) 100 MG CAPS, Take by mouth., Disp: , Rfl:  .  ferrous sulfate (IRON SUPPLEMENT) 325 (65 FE) MG tablet, Take 325 mg by mouth daily with breakfast., Disp: , Rfl:  .  fluticasone (FLONASE) 50 MCG/ACT nasal spray, SPRAY 2 SPRAYS INTO EACH NOSTRIL EVERY DAY, Disp: 48 mL, Rfl: 1 .  loratadine (CLARITIN) 10 MG tablet, Take 1 tablet (10 mg total) by mouth daily., Disp: 90 tablet, Rfl: 0 .  losartan (COZAAR) 25 MG tablet, TAKE 1 TABLET BY MOUTH EVERY DAY, Disp: 90 tablet, Rfl: 3 .  magnesium oxide (MAG-OX) 400 MG tablet, Take 400 mg by mouth daily. Reported on 12/08/2015, Disp: , Rfl:  .  methocarbamol (ROBAXIN) 750 MG tablet, Take 375-750 mg by mouth 2 (two) times daily as needed., Disp: , Rfl:  .  Multiple Vitamins-Minerals (CENTRUM SILVER) tablet, Take 1 tablet by mouth daily., Disp: 30 tablet, Rfl: 0 .  omeprazole (PRILOSEC) 20 MG capsule, TAKE 1 CAPSULE BY MOUTH EVERY DAY, Disp: 30 capsule, Rfl: 0 .  TURMERIC PO, Take by mouth., Disp: , Rfl:  .  metaxalone (SKELAXIN) 800 MG tablet, Take  400-800 mg by mouth 2 (two) times daily as needed. (Patient not taking: Reported on 03/09/2020), Disp: , Rfl:    Allergies  Allergen Reactions  . Contrast Media [Iodinated Diagnostic Agents]   . Shellfish Allergy     Edema     Past Surgical History:  Procedure Laterality Date  . BARIATRIC SURGERY    . CESAREAN SECTION     3 or more  . COLONOSCOPY WITH PROPOFOL N/A 03/15/2019   Procedure: COLONOSCOPY WITH PROPOFOL;  Surgeon: [name], MD;  Location: [facility] ENDOSCOPY;  Service: Gastroenterology;  Laterality: N/A;  . GASTRIC BYPASS    . SHOULDER ARTHROSCOPY Right   . TUBAL LIGATION       Family History  Problem Relation Age of Onset  . Hypercholesterolemia Mother   . Heart disease Mother   . Hypertension Mother   . Alcohol abuse Father   . Lung cancer Brother   . Alcohol abuse Brother   . Diabetes Mellitus II Sister   . Hypertension Maternal Grandmother   . Colon cancer Sister   . Breast cancer Neg Hx   . Prostate cancer Neg Hx   . Bladder Cancer Neg Hx   . Kidney cancer Neg Hx      Social History   Tobacco Use  . Smoking status: Never Smoker  . Smokeless tobacco: Never Used  Substance Use Topics  . Alcohol use: Not Currently    Alcohol/week: 0.0 standard drinks    With staff assistance, above reviewed with the patient today.  ROS: As per HPI, otherwise no specific complaints on a limited and focused system review   No results found for this or any previous visit (from the past 72 hour(s)).   PHQ2/9: Depression screen [facility] 2/9 03/09/2020 01/26/2020 10/08/2019 10/01/2019 09/23/2019  Decreased Interest 0 0 0 0 0  Down, Depressed, Hopeless 0 0 0 0 0  PHQ - 2 Score 0 0 0 0 0  Altered sleeping 0 0 - 0 0  Tired, decreased energy 0 0 - 1 1  Change in appetite 0 0 - 0 0  Feeling bad or failure about yourself  0 0 - 0 0  Trouble concentrating 0 0 - 0 0  Moving slowly or fidgety/restless 0 0 - 0 0  Suicidal thoughts 0 0 - 0 0  PHQ-9 Score 0 0 - 1 1  Difficult  doing work/chores Not difficult at all - - Not difficult at all Not difficult at all  Some recent data might be hidden   PHQ-2/9 Result is neg  Fall Risk: Fall Risk  03/09/2020 01/26/2020 10/08/2019 10/01/2019 09/23/2019  Falls in the past year? 1 1 1 1 1   Comment - - - - -  Number falls in past yr: 0 0 1 0 0  Injury with Fall? 1 1 1 1 1   Comment - - fracture of pinky finger on right hand - -  Risk for fall due to : - - History of fall(s) - -  Follow up - - Falls prevention discussed - -      Objective:   Vitals:   03/09/20 0847  BP: 130/90  Pulse: (!) 108  Resp: 20  Temp: 97.9 F (36.6 C)  TempSrc: Temporal  SpO2: 100%  Weight: 221 lb 11.2 oz (100.6 kg)  Height: 6' (1.829 m)    Body mass index is 30.07 kg/m.  Physical Exam   NAD, masked, patient was lying down on the table as she was feeling quite nauseous.  She was able to sit up for my assessment. HEENT - [place]/AT, sclera anicteric, PERRL, EOMI, conj - non-inj'ed, TM's and canals clear, pharynx clear, not markedly tender palpating the face, including nontender in the V2 distribution of the trigeminal nerve where she feels the pain.  Not marked tenderness palpating the TMJ joint with discomfort noted when opening her jaw at the TMJ region. Neck - supple, no adenopathy, carotids 2+ and = without bruits bilat Car - RRR without m/g/r, heart rate was approximately 84 and regular on my assessment, she was not tachycardic Pulm- RR and effort normal at rest, CTA without wheeze or rales Abd - soft, NT, Skin- no rash noted on the facial area,  Neuro/psychiatric - affect was not flat, appropriate with conversation  Alert and oriented  Cranial nerves II through XII intact with visual acuity not tested in the office today,  Grossly non-focal - good strength on testing extremities, sensation intact to LT in distal extremities  Speech  normal   Results for orders placed or performed in visit on 02/22/20  Microscopic Examination   Urine    Result Value Ref Range   WBC, UA 0-5 0 - 5 /hpf   RBC 0-2 0 - 2 /hpf   Epithelial Cells (non renal) 0-10 0 - 10 /hpf   Bacteria, UA Few None seen/Few   Yeast, UA Present (A) None seen  Urinalysis, Complete  Result Value Ref Range   Specific Gravity, UA 1.010 1.005 - 1.030   pH, UA 5.5 5.0 - 7.5   Color, UA Yellow Yellow   Appearance Ur Cloudy (A) Clear   Leukocytes,UA Negative Negative   Protein,UA Negative Negative/Trace   Glucose, UA Negative Negative   Ketones, UA Negative Negative   RBC, UA Trace (A) Negative   Bilirubin, UA Negative Negative   Urobilinogen, Ur 0.2 0.2 - 1.0 mg/dL   Nitrite, UA Negative Negative   Microscopic Examination See below:        Assessment & Plan:   1. Trigeminal neuralgia of left side of face Do feel patient's presentation is pretty classic for trigeminal neuralgia, with symptoms in the V2 distribution of the trigeminal nerve.  Noted to patient and daughter that often can have continuous dull pain between paroxysms of pain  that seems to be the case presently. Do feel having neurology input would be helpful presently noting the above, and do feel some type of neuroimaging is probably appropriate at this point as well. She has seen neurology for this previously, and will asked Melissa to help in contacting their office to have follow-up more acutely for her to be assessed.  Did note to the patient and the daughter that there are other entities in the differential as well. Recommended continuing the carbamazepine presently. Also using extra strength Tylenol, and avoiding the Tylenol with codeine as I do think that is making her more nauseous. Discussed potentially adding a gabapentin product to help, although the patient noted she cannot take that as she did not tolerate that previously. Also discussed potentially adding a tramadol type product, although concerned that we will also potentially cause some nausea as it is also an opioid entity. Agreed to  stick with the extra strength Tylenol as well as the carbamazepine product and await further input from neurology presently.   2. Nausea Do feel related to the Tylenol with codeine product, and as above, avoiding that product presently recommended. Emphasized to the patient and daughter if her pain is significantly worsening, or if she is having vomiting with the nausea, she should proceed to an emergency setting for more acute management.  Melissa helping with contact with neurology presently and await their further input and evaluation.    Towanda Malkin, MD 03/09/20 8:53 AM

## 2020-03-09 NOTE — Patient Instructions (Signed)
Trigeminal Neuralgia  Trigeminal neuralgia is a nerve disorder that causes severe pain on one side of the face. The pain may last from a few seconds to several minutes. The pain is usually only on one side of the face. Symptoms may occur for days, weeks, or months and then go away for months or years. The pain may return and be worse than before. What are the causes? This condition is caused by damage or pressure to a nerve in the head that is called the trigeminal nerve. An attack can be triggered by:  Talking.  Chewing.  Putting on makeup.  Washing your face.  Shaving your face.  Brushing your teeth.  Touching your face. What increases the risk? You are more likely to develop this condition if you:  Are 50 years of age or older.  Are female. What are the signs or symptoms? The main symptom of this condition is severe pain in the:  Jaw.  Lips.  Eyes.  Nose.  Scalp.  Forehead.  Face. The pain may be:  Intense.  Stabbing.  Electric.  Shock-like. How is this diagnosed? This condition is diagnosed with a physical exam. A CT scan or an MRI may be done to rule out other conditions that can cause facial pain. How is this treated? This condition may be treated with:  Avoiding the things that trigger your symptoms.  Taking prescription medicines (anticonvulsants).  Having surgery. This may be done in severe cases if other medical treatment does not provide relief.  Having procedures such as ablation, thermal, or radiation therapy. It may take up to one month for treatment to start relieving the pain. Follow these instructions at home: Managing pain  Learn as much as you can about how to manage your pain. Ask your health care provider if a pain specialist would be helpful.  Consider talking with a mental health care provider (psychologist) about how to cope with the pain.  Consider joining a pain support group. General instructions  Take  over-the-counter and prescription medicines only as told by your health care provider.  Avoid the things that trigger your symptoms. It may help to: ? Chew on the unaffected side of your mouth. ? Avoid touching your face. ? Avoid blasts of hot or cold air.  Follow your treatment plan as told by your health care provider. This may include: ? Cognitive or behavioral therapy. ? Gentle, regular exercise. ? Meditation or yoga. ? Aromatherapy.  Keep all follow-up visits as told by your health care provider. You may need to be monitored closely to make sure treatment is working well for you. Where to find more information  Facial Pain Association: fpa-support.org Contact a health care provider if:  Your medicine is not helping your symptoms.  You have side effects from the medicine used for treatment.  You develop new, unexplained symptoms, such as: ? Double vision. ? Facial weakness. ? Facial numbness. ? Changes in hearing or balance.  You feel depressed. Get help right away if:  Your pain is severe and is not getting better.  You develop suicidal thoughts. If you ever feel like you may hurt yourself or others, or have thoughts about taking your own life, get help right away. You can go to your nearest emergency department or call:  Your local emergency services (911 in the [place]  A suicide crisis helpline, such as the National Suicide Prevention Lifeline at 1-800-273-8255. This is open 24 hours a day. Summary  Trigeminal neuralgia is a   nerve disorder that causes severe pain on one side of the face. The pain may last from a few seconds to several minutes.  This condition is caused by damage or pressure to a nerve in the head that is called the trigeminal nerve.  Treatment may include avoiding the things that trigger your symptoms, taking medicines, or having surgery or procedures. It may take up to one month for treatment to start relieving the pain.  Avoid the things that  trigger your symptoms.  Keep all follow-up visits as told by your health care provider. You may need to be monitored closely to make sure treatment is working well for you. This information is not intended to replace advice given to you by your health care provider. Make sure you discuss any questions you have with your health care provider. Document Revised: 06/22/2018 Document Reviewed: 06/22/2018 Elsevier Patient Education  2020 [employer]  

## 2020-03-09 NOTE — Telephone Encounter (Signed)
Appointment scheduled with Neurology, Dr. Melrose Nakayama for 03/09/2020 @ 1:45. Patient was made aware.

## 2020-03-14 DIAGNOSIS — F411 Generalized anxiety disorder: Secondary | ICD-10-CM | POA: Diagnosis not present

## 2020-03-17 ENCOUNTER — Ambulatory Visit: Payer: Medicare Other | Admitting: Cardiovascular Disease

## 2020-03-17 DIAGNOSIS — M545 Low back pain: Secondary | ICD-10-CM | POA: Diagnosis not present

## 2020-03-17 DIAGNOSIS — M9902 Segmental and somatic dysfunction of thoracic region: Secondary | ICD-10-CM | POA: Diagnosis not present

## 2020-03-17 DIAGNOSIS — M5137 Other intervertebral disc degeneration, lumbosacral region: Secondary | ICD-10-CM | POA: Diagnosis not present

## 2020-03-17 DIAGNOSIS — M50322 Other cervical disc degeneration at C5-C6 level: Secondary | ICD-10-CM | POA: Diagnosis not present

## 2020-03-17 DIAGNOSIS — M9901 Segmental and somatic dysfunction of cervical region: Secondary | ICD-10-CM | POA: Diagnosis not present

## 2020-03-17 DIAGNOSIS — M9903 Segmental and somatic dysfunction of lumbar region: Secondary | ICD-10-CM | POA: Diagnosis not present

## 2020-03-17 DIAGNOSIS — M542 Cervicalgia: Secondary | ICD-10-CM | POA: Diagnosis not present

## 2020-03-17 DIAGNOSIS — M5417 Radiculopathy, lumbosacral region: Secondary | ICD-10-CM | POA: Diagnosis not present

## 2020-03-20 DIAGNOSIS — M50322 Other cervical disc degeneration at C5-C6 level: Secondary | ICD-10-CM | POA: Diagnosis not present

## 2020-03-20 DIAGNOSIS — M9901 Segmental and somatic dysfunction of cervical region: Secondary | ICD-10-CM | POA: Diagnosis not present

## 2020-03-20 DIAGNOSIS — M542 Cervicalgia: Secondary | ICD-10-CM | POA: Diagnosis not present

## 2020-03-20 DIAGNOSIS — M5417 Radiculopathy, lumbosacral region: Secondary | ICD-10-CM | POA: Diagnosis not present

## 2020-03-20 DIAGNOSIS — M5137 Other intervertebral disc degeneration, lumbosacral region: Secondary | ICD-10-CM | POA: Diagnosis not present

## 2020-03-20 DIAGNOSIS — M9902 Segmental and somatic dysfunction of thoracic region: Secondary | ICD-10-CM | POA: Diagnosis not present

## 2020-03-20 DIAGNOSIS — M545 Low back pain: Secondary | ICD-10-CM | POA: Diagnosis not present

## 2020-03-20 DIAGNOSIS — M9903 Segmental and somatic dysfunction of lumbar region: Secondary | ICD-10-CM | POA: Diagnosis not present

## 2020-03-22 ENCOUNTER — Telehealth: Payer: Self-pay | Admitting: Gastroenterology

## 2020-03-22 DIAGNOSIS — M5137 Other intervertebral disc degeneration, lumbosacral region: Secondary | ICD-10-CM | POA: Diagnosis not present

## 2020-03-22 DIAGNOSIS — M5417 Radiculopathy, lumbosacral region: Secondary | ICD-10-CM | POA: Diagnosis not present

## 2020-03-22 DIAGNOSIS — M545 Low back pain: Secondary | ICD-10-CM | POA: Diagnosis not present

## 2020-03-22 DIAGNOSIS — M50322 Other cervical disc degeneration at C5-C6 level: Secondary | ICD-10-CM | POA: Diagnosis not present

## 2020-03-22 DIAGNOSIS — M9901 Segmental and somatic dysfunction of cervical region: Secondary | ICD-10-CM | POA: Diagnosis not present

## 2020-03-22 DIAGNOSIS — M9902 Segmental and somatic dysfunction of thoracic region: Secondary | ICD-10-CM | POA: Diagnosis not present

## 2020-03-22 DIAGNOSIS — M542 Cervicalgia: Secondary | ICD-10-CM | POA: Diagnosis not present

## 2020-03-22 DIAGNOSIS — M9903 Segmental and somatic dysfunction of lumbar region: Secondary | ICD-10-CM | POA: Diagnosis not present

## 2020-03-22 NOTE — Telephone Encounter (Signed)
LVM for patient to call office to schedule an appointment. (followed up with patient). Patient wants to continue care with Dr. Allen Norris.

## 2020-03-22 NOTE — Telephone Encounter (Signed)
Pt was originally scheduled with Dr. Allen Norris for a colonoscopy but was rescheduled with Dr. Marius Ditch, maybe due to a scheduling issue. She can be scheduled for an appt with Dr. Allen Norris at his next available.

## 2020-03-22 NOTE — Telephone Encounter (Signed)
Patient wants to know if she can get an appointment with Dr. Allen Norris (last seen on 7/16/20215). Dr. Marius Ditch did Colonoscopy Procedure on 03/15/2019.

## 2020-03-22 NOTE — Telephone Encounter (Signed)
Please call the patient and find out the reasons for switching.

## 2020-03-26 NOTE — Progress Notes (Signed)
Office Visit    Patient Name: Melissa [name] Date of Encounter: 03/27/2020  Primary Care Provider:  Steele Sizer, MD Primary Cardiologist:  Ida Rogue, MD Electrophysiologist:  None   Chief Complaint    Melissa [name] is a 69 y.o. female with a hx of cardiomyopathy, aortic atherosclerosis, CKDIII, HTN, HLD, obesity s/p gastric bypass, OSA on CPAP, RLS, sickle cell trait, cervical radiculopathy, chronic back pain, depression presents today for follow up of cardiomyopathy, HTN.   Past Medical History    Past Medical History:  Diagnosis Date  . Bruising   . Cervical radiculopathy   . Deficiency of vitamin B   . Depression   . Edema   . Flank pain   . Gross hematuria   . HTN (hypertension)   . Iron deficiency anemia   . Leukopenia   . OA (osteoarthritis)   . Obesity   . OSA on CPAP   . Osteopenia   . Perennial allergic rhinitis   . Renal cysts, acquired, bilateral   . RLS (restless legs syndrome)   . Sickle cell trait ([place])   . Snoring   . Spinal stenosis   . Trigeminal neuralgia    Past Surgical History:  Procedure Laterality Date  . BARIATRIC SURGERY    . CESAREAN SECTION     3 or more  . COLONOSCOPY WITH PROPOFOL N/A 03/15/2019   Procedure: COLONOSCOPY WITH PROPOFOL;  Surgeon: [name], MD;  Location: [facility] ENDOSCOPY;  Service: Gastroenterology;  Laterality: N/A;  . GASTRIC BYPASS    . SHOULDER ARTHROSCOPY Right   . TUBAL LIGATION      Allergies  Allergies  Allergen Reactions  . Contrast Media [Iodinated Diagnostic Agents]   . Shellfish Allergy     Edema    History of Present Illness    Melissa [name] is a 69 y.o. female with a hx of cardiomyopathy, aortic atherosclerosis, CKDIII, HTN, HLD, obesity s/p gastric bypass, OSA on CPAP, RLS, sickle cell trait, cervical radiculopathy, chronic back pain, depression last seen via telemedicine 02/2019.  Previously followed by Dr. Yvone Neu though more recenlty established with Dr. Rockey Situ.  Prior myoview 10/2015 with no ischemia, EF 57%, low risk scan. Echo 11/24/15 EF 35-40%, severe hypokinesis of mid apicalanteroseptal and anterior myocardium, gr1DD, mild TR, normal PASP. Follow up limited echo 11/2015 with EF 40-45%, mild concentric LVH, normal LA, RVSF/PASP normal. CT renal stone protocol from 10/2016 with aortic atherosclerosis.   Seen by PCP 11/2018 and placed on HCTZ for ankle edema with improvement. She was seen via telemedicine 01/2019 and recommended for repeat echocardiogram. Her HCTZ was discontinued due to decline in renal function. Echo 02/2019 with EF 07-37%, normal diastolic function, no RWMA, aortic root and ascending aorta normal in size, trivial MR. Seen by her [facility] 02/2019 doing well and no changes were made.  Reports feeling overall well.  Does share with me that she had Covid in September.  She was not hospitalized and the remainder of her family members were.  She did have trouble with dyspnea after coded and was able to get off her nebulizer in February.  Unfortunately she did lose her husband due to Covid and I offered my condolences.  She has a strong faith and this has helped her through her grieving process.  No formal exercise regimen.  Her exercise is limited by spinal stenosis and chronic back pain.  Reports no shortness of breath nor dyspnea on exertion. Reports no chest pain, pressure, or  tightness. No edema, orthopnea, PND. Reports no palpitations.   EKGs/Labs/Other Studies Reviewed:   The following studies were reviewed today:  2D Echo 11/2015: - Left ventricle: The cavity size was normal. There was mild concentric hypertrophy. Systolic function was mildly to moderately reduced. The estimated ejection fraction was in the range of 40% to 45%. Mild diffuse hypokinesis. Regional wal motion abnormalities cannot be excluded. The study is not technically sufficient to allow evaluation of LV diastolic function. - Left atrium: The atrium was normal in size. - Right  ventricle: Systolic function was normal. - Pulmonary arteries: Systolic pressure was within the normal   range. __________   Myoview 10/2015: Pharmacological myocardial perfusion imaging study with no significant ischemia Normal wall motion, EF estimated at 57% No EKG changes concerning for ischemia at peak stress or in recovery. Low risk scan    EKG:  EKG is ordered today.  The ekg ordered today demonstrates NSR 76 bpm with no acute ST/T wave changes.  Recent Labs: 05/21/2019: ALT 19; BUN 18; Creatinine, Ser 1.46; Potassium 4.2; Sodium 140 09/06/2019: Hemoglobin 12.2; Platelets 252  Recent Lipid Panel    Component Value Date/Time   CHOL 220 (H) 07/29/2019 0000   CHOL 256 (H) 06/28/2015 1103   TRIG 72 07/29/2019 0000   HDL 102 07/29/2019 0000   HDL 92 06/28/2015 1103   CHOLHDL 2.2 07/29/2019 0000   VLDL 17 10/17/2016 0806   LDLCALC 102 (H) 07/29/2019 0000    Home Medications   Current Meds  Medication Sig  . albuterol (VENTOLIN HFA) 108 (90 Base) MCG/ACT inhaler Inhale 2 puffs into the lungs every 6 (six) hours as needed for wheezing or shortness of breath.  . ASHWAGANDHA PO Take by mouth daily.   Marland Kitchen aspirin 81 MG EC tablet TAKE 1 TABLET BY MOUTH EVERY DAY  . atorvastatin (LIPITOR) 40 MG tablet Take 1 tablet (40 mg total) by mouth daily.  Marland Kitchen azelastine (ASTELIN) 0.1 % nasal spray 2 SPRAYS IN EACH NOSTRIL DAILY  . beclomethasone (QVAR REDIHALER) 40 MCG/ACT inhaler Inhale 1 puff into the lungs 2 (two) times daily.  Marland Kitchen BLACK CURRANT SEED OIL PO Take by mouth daily.   . budesonide (PULMICORT) 0.25 MG/2ML nebulizer solution Take 0.25 mg by nebulization 2 (two) times daily. PRN only  . carbamazepine (TEGRETOL) 200 MG tablet Take 200 mg by mouth 3 (three) times daily as needed.  . Cholecalciferol (VITAMIN D3 ADULT GUMMIES PO) Take 2 each by mouth daily.   Mariane Baumgarten Sodium (DSS) 100 MG CAPS Take by mouth at bedtime.   . ferrous sulfate (IRON SUPPLEMENT) 325 (65 FE) MG tablet Take 325  mg by mouth daily with breakfast.  . fluticasone (FLONASE) 50 MCG/ACT nasal spray SPRAY 2 SPRAYS INTO EACH NOSTRIL EVERY DAY  . gabapentin (NEURONTIN) 100 MG capsule Take 100 mg by mouth at bedtime as needed.  . loratadine (CLARITIN) 10 MG tablet Take 1 tablet (10 mg total) by mouth daily.  Marland Kitchen losartan (COZAAR) 25 MG tablet TAKE 1 TABLET BY MOUTH EVERY DAY  . magnesium oxide (MAG-OX) 400 MG tablet Take 400 mg by mouth daily. Reported on 12/08/2015  . Multiple Vitamins-Minerals (CENTRUM SILVER) tablet Take 1 tablet by mouth daily.  Marland Kitchen omeprazole (PRILOSEC) 20 MG capsule TAKE 1 CAPSULE BY MOUTH EVERY DAY  . TURMERIC PO Take by mouth daily.     Review of Systems   Review of Systems  Constitutional: Negative for chills, fever and malaise/fatigue.  Cardiovascular: Negative for chest pain, dyspnea  on exertion, leg swelling, near-syncope, orthopnea, palpitations and syncope.  Respiratory: Negative for cough, shortness of breath and wheezing.   Musculoskeletal: Positive for back pain.  Gastrointestinal: Negative for nausea and vomiting.  Neurological: Negative for dizziness, light-headedness and weakness.   All other systems reviewed and are otherwise negative except as noted above.  Physical Exam    VS:  BP 130/90 (BP Location: Left Arm, Patient Position: Sitting, Cuff Size: Normal)   Pulse 76   Ht 6' (1.829 m)   Wt 224 lb 2 oz (101.7 kg)   SpO2 99%   BMI 30.40 kg/m  , BMI Body mass index is 30.4 kg/m. GEN: Well nourished, well developed, in no acute distress. HEENT: normal. Neck: Supple, no JVD, carotid bruits, or masses. Cardiac: RRR, no murmurs, rubs, or gallops. No clubbing, cyanosis, edema.  Radials/DP/PT 2+ and equal bilaterally.  Respiratory:  Respirations regular and unlabored, clear to auscultation bilaterally. GI: Soft, nontender, nondistended, BS + x 4. MS: No deformity or atrophy. Skin: Warm and dry, no rash. Neuro:  Strength and sensation are intact. Psych: Normal  affect.   Assessment & Plan    1. Cardiomyopathy -euvolemic and well compensated on exam.  Most recent echo 01/2019 with LVEF 50-55%, no RWMA, trivial MR.  Repeat echocardiogram for reassessment of LVEF after Covid. Will plan to add beta blocker -likely low-dose Coreg - pending results. Continue Losartan.   2. Aortic atherosclerosis -she reports no anginal symptoms.  No indication for ischemic evaluation.  EKG today no acute ST/T wave changes.  Continue aspirin, statin.  Update lipid panel, as below.  3. CKD III -careful titration of diuretics and antihypertensive.  Avoid nephrotoxic agents.  4. HTN - BP well controlled. Continue current antihypertensive regimen.  5. HLD - 07/2019 LDL 102. Continue Atorvastatin 40mg  daily. Lipid panel, CMP today. Consider increased dose of Atorvastatin. LDL goal <100, ideally <70.  6. Post COVID -reports improvement in her dyspnea since Covid.  She was diagnosed 04/2019 and was able to get off of her nebulizer 09/2019.  To reassess EF and any cardiac complications of Covid, update echocardiogram.  Disposition: Follow up in 6 month(s) with Dr. Rockey Situ or APP   Loel Dubonnet, NP 03/27/2020, 9:30 AM

## 2020-03-27 ENCOUNTER — Other Ambulatory Visit: Payer: Self-pay

## 2020-03-27 ENCOUNTER — Ambulatory Visit (INDEPENDENT_AMBULATORY_CARE_PROVIDER_SITE_OTHER): Payer: Medicare Other | Admitting: Family

## 2020-03-27 ENCOUNTER — Encounter: Payer: Self-pay | Admitting: Family

## 2020-03-27 VITALS — BP 130/90 | HR 76 | Ht 72.0 in | Wt 224.1 lb

## 2020-03-27 DIAGNOSIS — I429 Cardiomyopathy, unspecified: Secondary | ICD-10-CM | POA: Diagnosis not present

## 2020-03-27 DIAGNOSIS — N1831 Chronic kidney disease, stage 3a: Secondary | ICD-10-CM | POA: Diagnosis not present

## 2020-03-27 DIAGNOSIS — E782 Mixed hyperlipidemia: Secondary | ICD-10-CM | POA: Diagnosis not present

## 2020-03-27 DIAGNOSIS — Z8616 Personal history of COVID-19: Secondary | ICD-10-CM | POA: Diagnosis not present

## 2020-03-27 DIAGNOSIS — I700 Atherosclerosis of aorta: Secondary | ICD-10-CM

## 2020-03-27 DIAGNOSIS — R0600 Dyspnea, unspecified: Secondary | ICD-10-CM | POA: Diagnosis not present

## 2020-03-27 NOTE — Patient Instructions (Addendum)
Medication Instructions:  No medication changes today.   *If you need a refill on your cardiac medications before your next appointment, please call your pharmacy*   Lab Work: Your provider recommends lab work: lipid panel, CMP  If you have labs (blood work) drawn today and your tests are completely normal, you will receive your results only by: Marland Kitchen MyChart Message (if you have MyChart) OR . A paper copy in the mail If you have any lab test that is abnormal or we need to change your treatment, we will call you to review the results.   Testing/Procedures: Your physician has requested that you have an echocardiogram. Echocardiography is a painless test that uses sound waves to create images of your heart. It provides your doctor with information about the size and shape of your heart and how well your heart's chambers and valves are working. This procedure takes approximately one hour. There are no restrictions for this procedure. You may get an IV, if needed, to receive an ultrasound enhancing agent through to better visualize your heart.   Follow-Up: At [facility], you and your health needs are our priority.  As part of our continuing mission to provide you with exceptional heart care, we have created designated Provider Care Teams.  These Care Teams include your primary Cardiologist (physician) and Advanced Practice Providers (APPs -  Physician Assistants and Nurse Practitioners) who all work together to provide you with the care you need, when you need it.  We recommend signing up for the patient portal called "MyChart".  Sign up information is provided on this After Visit Summary.  MyChart is used to connect with patients for Virtual Visits (Telemedicine).  Patients are able to view lab/test results, encounter notes, upcoming appointments, etc.  Non-urgent messages can be sent to your provider as well.   To learn more about what you can do with MyChart, go to [email or web address].     Your next appointment:   6 month(s)  The format for your next appointment:   In Person  Provider:   You may see Ida Rogue, MD or one of the following Advanced Practice Providers on your designated Care Team:    Murray Hodgkins, NP  Christell Faith, [name]  Marrianne Mood, [name]  Laurann Montana, NP  Other Instructions  Fat and Cholesterol Restricted Eating Plan Getting too much fat and cholesterol in your diet may cause health problems. Choosing the right foods helps keep your fat and cholesterol at normal levels. This can keep you from getting certain diseases.  What are tips for following this plan? Meal planning  At meals, divide your plate into four equal parts: ? Fill one-half of your plate with vegetables and green salads. ? Fill one-fourth of your plate with whole grains. ? Fill one-fourth of your plate with low-fat (lean) protein foods.  Eat fish that is high in omega-3 fats at least two times a week. This includes mackerel, tuna, sardines, and salmon.  Eat foods that are high in fiber, such as whole grains, beans, apples, broccoli, carrots, peas, and barley. General tips   Work with your doctor to lose weight if you need to.  Avoid: ? Foods with added sugar. ? Fried foods. ? Foods with partially hydrogenated oils.  Limit alcohol intake to no more than 1 drink a day for nonpregnant women and 2 drinks a day for men. One drink equals 12 oz of beer, 5 oz of wine, or 1 oz of hard liquor. Reading food labels  Check food labels for: ? Trans fats. ? Partially hydrogenated oils. ? Saturated fat (g) in each serving. ? Cholesterol (mg) in each serving. ? Fiber (g) in each serving.  Choose foods with healthy fats, such as: ? Monounsaturated fats. ? Polyunsaturated fats. ? Omega-3 fats.  Choose grain products that have whole grains. Look for the word "whole" as the first word in the ingredient list. Cooking  Cook foods using low-fat methods. These include  baking, boiling, grilling, and broiling.  Eat more home-cooked foods. Eat at restaurants and buffets less often.  Avoid cooking using saturated fats, such as butter, cream, palm oil, palm kernel oil, and coconut oil. Recommended foods  Fruits  All fresh, canned (in natural juice), or frozen fruits. Vegetables  Fresh or frozen vegetables (raw, steamed, roasted, or grilled). Green salads. Grains  Whole grains, such as whole wheat or whole grain breads, crackers, cereals, and pasta. Unsweetened oatmeal, bulgur, barley, quinoa, or brown rice. Corn or whole wheat flour tortillas. Meats and other protein foods  Ground beef (85% or leaner), grass-fed beef, or beef trimmed of fat. Skinless chicken or [place]. Ground chicken or [place]. Pork trimmed of fat. All fish and seafood. Egg whites. Dried beans, peas, or lentils. Unsalted nuts or seeds. Unsalted canned beans. Nut butters without added sugar or oil. Dairy  Low-fat or nonfat dairy products, such as skim or 1% milk, 2% or reduced-fat cheeses, low-fat and fat-free ricotta or cottage cheese, or plain low-fat and nonfat yogurt. Fats and oils  Tub margarine without trans fats. Light or reduced-fat mayonnaise and salad dressings. Avocado. Olive, canola, sesame, or safflower oils. The items listed above may not be a complete list of foods and beverages you can eat. Contact a dietitian for more information. Foods to avoid Fruits  Canned fruit in heavy syrup. Fruit in cream or butter sauce. Fried fruit. Vegetables  Vegetables cooked in cheese, cream, or butter sauce. Fried vegetables. Grains  White bread. White pasta. White rice. Cornbread. Bagels, pastries, and croissants. Crackers and snack foods that contain trans fat and hydrogenated oils. Meats and other protein foods  Fatty cuts of meat. Ribs, chicken wings, bacon, sausage, bologna, salami, chitterlings, fatback, hot dogs, bratwurst, and packaged lunch meats. Liver and organ meats.  Whole eggs and egg yolks. Chicken and [place] with skin. Fried meat. Dairy  Whole or 2% milk, cream, half-and-half, and cream cheese. Whole milk cheeses. Whole-fat or sweetened yogurt. Full-fat cheeses. Nondairy creamers and whipped toppings. Processed cheese, cheese spreads, and cheese curds. Beverages  Alcohol. Sugar-sweetened drinks such as sodas, lemonade, and fruit drinks. Fats and oils  Butter, stick margarine, lard, shortening, ghee, or bacon fat. Coconut, palm kernel, and palm oils. Sweets and desserts  Corn syrup, sugars, honey, and molasses. Candy. Jam and jelly. Syrup. Sweetened cereals. Cookies, pies, cakes, donuts, muffins, and ice cream. The items listed above may not be a complete list of foods and beverages you should avoid. Contact a dietitian for more information. Summary  Choosing the right foods helps keep your fat and cholesterol at normal levels. This can keep you from getting certain diseases.  At meals, fill one-half of your plate with vegetables and green salads.  Eat high-fiber foods, like whole grains, beans, apples, carrots, peas, and barley.  Limit added sugar, saturated fats, alcohol, and fried foods. This information is not intended to replace advice given to you by your health care provider. Make sure you discuss any questions you have with your health care provider. Document Revised: 04/08/2018  Document Reviewed: 04/22/2017 Elsevier Patient Education  [employer].

## 2020-03-28 LAB — COMPREHENSIVE METABOLIC PANEL
ALT: 15 IU/L (ref 0–32)
AST: 24 IU/L (ref 0–40)
Albumin/Globulin Ratio: 1.4 (ref 1.2–2.2)
Albumin: 4 g/dL (ref 3.8–4.8)
Alkaline Phosphatase: 72 IU/L (ref 48–121)
BUN/Creatinine Ratio: 10 — ABNORMAL LOW (ref 12–28)
BUN: 14 mg/dL (ref 8–27)
Bilirubin Total: 0.3 mg/dL (ref 0.0–1.2)
CO2: 25 mmol/L (ref 20–29)
Calcium: 9.6 mg/dL (ref 8.7–10.3)
Chloride: 108 mmol/L — ABNORMAL HIGH (ref 96–106)
Creatinine, Ser: 1.37 mg/dL — ABNORMAL HIGH (ref 0.57–1.00)
GFR calc Af Amer: 46 mL/min/{1.73_m2} — ABNORMAL LOW (ref >59)
GFR calc non Af Amer: 40 mL/min/{1.73_m2} — ABNORMAL LOW (ref >59)
Globulin, Total: 2.9 g/dL (ref 1.5–4.5)
Glucose: 98 mg/dL (ref 65–99)
Potassium: 4.7 mmol/L (ref 3.5–5.2)
Sodium: 142 mmol/L (ref 134–144)
Total Protein: 6.9 g/dL (ref 6.0–8.5)

## 2020-03-28 LAB — LIPID PANEL
Chol/HDL Ratio: 2.2 ratio (ref 0.0–4.4)
Cholesterol, Total: 193 mg/dL (ref 100–199)
HDL: 88 mg/dL (ref >39)
LDL Chol Calc (NIH): 92 mg/dL (ref 0–99)
Triglycerides: 69 mg/dL (ref 0–149)
VLDL Cholesterol Cal: 13 mg/dL (ref 5–40)

## 2020-03-29 DIAGNOSIS — M5137 Other intervertebral disc degeneration, lumbosacral region: Secondary | ICD-10-CM | POA: Diagnosis not present

## 2020-03-29 DIAGNOSIS — M9901 Segmental and somatic dysfunction of cervical region: Secondary | ICD-10-CM | POA: Diagnosis not present

## 2020-03-29 DIAGNOSIS — M50322 Other cervical disc degeneration at C5-C6 level: Secondary | ICD-10-CM | POA: Diagnosis not present

## 2020-03-29 DIAGNOSIS — M542 Cervicalgia: Secondary | ICD-10-CM | POA: Diagnosis not present

## 2020-03-29 DIAGNOSIS — M9902 Segmental and somatic dysfunction of thoracic region: Secondary | ICD-10-CM | POA: Diagnosis not present

## 2020-03-29 DIAGNOSIS — M5417 Radiculopathy, lumbosacral region: Secondary | ICD-10-CM | POA: Diagnosis not present

## 2020-03-29 DIAGNOSIS — M545 Low back pain: Secondary | ICD-10-CM | POA: Diagnosis not present

## 2020-03-29 DIAGNOSIS — M9903 Segmental and somatic dysfunction of lumbar region: Secondary | ICD-10-CM | POA: Diagnosis not present

## 2020-03-31 ENCOUNTER — Other Ambulatory Visit: Payer: Self-pay | Admitting: Internal Medicine

## 2020-04-05 ENCOUNTER — Telehealth: Payer: Self-pay | Admitting: Acute Care

## 2020-04-05 NOTE — Telephone Encounter (Signed)
CT Chest was scheduled for 04/05/2020 at [facility] (documented in Referral) however, it never appeared on schedule.  Called and R/S CT Chest for 04/12/2020 at 8:30 at [facility]. Pt to arrive at 8:15. No prep.  Pt is aware of appointment date of 04/12/2020 at [facility].  Waited for CT Chest to appear on pt's schedule, once CT appeared I called and spoke with patient and she is aware of this appointment for 04/12/2020 at 8:15. [name] Nothing else needed at this time. [name]

## 2020-04-06 ENCOUNTER — Other Ambulatory Visit: Payer: Self-pay

## 2020-04-06 DIAGNOSIS — R0602 Shortness of breath: Secondary | ICD-10-CM

## 2020-04-07 ENCOUNTER — Ambulatory Visit (INDEPENDENT_AMBULATORY_CARE_PROVIDER_SITE_OTHER): Payer: Medicare Other | Admitting: Internal Medicine

## 2020-04-07 ENCOUNTER — Other Ambulatory Visit: Payer: Self-pay

## 2020-04-07 DIAGNOSIS — M5417 Radiculopathy, lumbosacral region: Secondary | ICD-10-CM | POA: Diagnosis not present

## 2020-04-07 DIAGNOSIS — R0600 Dyspnea, unspecified: Secondary | ICD-10-CM

## 2020-04-07 DIAGNOSIS — M9902 Segmental and somatic dysfunction of thoracic region: Secondary | ICD-10-CM | POA: Diagnosis not present

## 2020-04-07 DIAGNOSIS — M545 Low back pain: Secondary | ICD-10-CM | POA: Diagnosis not present

## 2020-04-07 DIAGNOSIS — M50322 Other cervical disc degeneration at C5-C6 level: Secondary | ICD-10-CM | POA: Diagnosis not present

## 2020-04-07 DIAGNOSIS — M9901 Segmental and somatic dysfunction of cervical region: Secondary | ICD-10-CM | POA: Diagnosis not present

## 2020-04-07 DIAGNOSIS — M5137 Other intervertebral disc degeneration, lumbosacral region: Secondary | ICD-10-CM | POA: Diagnosis not present

## 2020-04-07 DIAGNOSIS — M9903 Segmental and somatic dysfunction of lumbar region: Secondary | ICD-10-CM | POA: Diagnosis not present

## 2020-04-07 DIAGNOSIS — M542 Cervicalgia: Secondary | ICD-10-CM | POA: Diagnosis not present

## 2020-04-07 DIAGNOSIS — R0609 Other forms of dyspnea: Secondary | ICD-10-CM

## 2020-04-07 LAB — PULMONARY FUNCTION TEST
DL/VA % pred: 70 %
DL/VA: 2.8 ml/min/mmHg/L
DLCO cor % pred: 81 %
DLCO cor: 20.18 ml/min/mmHg
DLCO unc % pred: 82 %
DLCO unc: 20.3 ml/min/mmHg
FEF 25-75 Post: 2.81 L/sec
FEF 25-75 Pre: 2.85 L/sec
FEF2575-%Change-Post: -1 %
FEF2575-%Pred-Post: 120 %
FEF2575-%Pred-Pre: 122 %
FEV1-%Change-Post: 0 %
FEV1-%Pred-Post: 137 %
FEV1-%Pred-Pre: 137 %
FEV1-Post: 3.55 L
FEV1-Pre: 3.54 L
FEV1FVC-%Change-Post: 2 %
FEV1FVC-%Pred-Pre: 96 %
FEV6-%Change-Post: -2 %
FEV6-%Pred-Post: 141 %
FEV6-%Pred-Pre: 144 %
FEV6-Post: 4.52 L
FEV6-Pre: 4.61 L
FEV6FVC-%Change-Post: -1 %
FEV6FVC-%Pred-Post: 101 %
FEV6FVC-%Pred-Pre: 102 %
FVC-%Change-Post: -2 %
FVC-%Pred-Post: 140 %
FVC-%Pred-Pre: 143 %
FVC-Post: 4.61 L
FVC-Pre: 4.71 L
Post FEV1/FVC ratio: 77 %
Post FEV6/FVC ratio: 98 %
Pre FEV1/FVC ratio: 75 %
Pre FEV6/FVC Ratio: 99 %
RV % pred: 113 %
RV: 2.81 L
TLC % pred: 122 %
TLC: 7.48 L

## 2020-04-07 NOTE — Progress Notes (Signed)
PFT done today. 

## 2020-04-10 ENCOUNTER — Ambulatory Visit: Payer: Medicare Other | Admitting: Acute Care

## 2020-04-10 DIAGNOSIS — M1711 Unilateral primary osteoarthritis, right knee: Secondary | ICD-10-CM | POA: Diagnosis not present

## 2020-04-11 DIAGNOSIS — F411 Generalized anxiety disorder: Secondary | ICD-10-CM | POA: Diagnosis not present

## 2020-04-12 ENCOUNTER — Ambulatory Visit
Admission: RE | Admit: 2020-04-12 | Discharge: 2020-04-12 | Disposition: A | Discharge status: Home / Self Care | Payer: Medicare Other | Source: Ambulatory Visit | Attending: Acute Care | Admitting: Acute Care

## 2020-04-12 ENCOUNTER — Other Ambulatory Visit: Payer: Self-pay

## 2020-04-12 DIAGNOSIS — R918 Other nonspecific abnormal finding of lung field: Secondary | ICD-10-CM | POA: Insufficient documentation

## 2020-04-12 DIAGNOSIS — I2510 Atherosclerotic heart disease of native coronary artery without angina pectoris: Secondary | ICD-10-CM | POA: Diagnosis not present

## 2020-04-12 DIAGNOSIS — R911 Solitary pulmonary nodule: Secondary | ICD-10-CM | POA: Diagnosis not present

## 2020-04-12 DIAGNOSIS — R590 Localized enlarged lymph nodes: Secondary | ICD-10-CM | POA: Diagnosis not present

## 2020-04-12 DIAGNOSIS — H43811 Vitreous degeneration, right eye: Secondary | ICD-10-CM | POA: Diagnosis not present

## 2020-04-12 DIAGNOSIS — I700 Atherosclerosis of aorta: Secondary | ICD-10-CM | POA: Diagnosis not present

## 2020-04-13 DIAGNOSIS — G500 Trigeminal neuralgia: Secondary | ICD-10-CM | POA: Diagnosis not present

## 2020-04-14 ENCOUNTER — Other Ambulatory Visit: Payer: Self-pay | Admitting: Family

## 2020-04-14 DIAGNOSIS — M9901 Segmental and somatic dysfunction of cervical region: Secondary | ICD-10-CM | POA: Diagnosis not present

## 2020-04-14 DIAGNOSIS — M5137 Other intervertebral disc degeneration, lumbosacral region: Secondary | ICD-10-CM | POA: Diagnosis not present

## 2020-04-14 DIAGNOSIS — I429 Cardiomyopathy, unspecified: Secondary | ICD-10-CM

## 2020-04-14 DIAGNOSIS — Z8616 Personal history of COVID-19: Secondary | ICD-10-CM

## 2020-04-14 DIAGNOSIS — M9902 Segmental and somatic dysfunction of thoracic region: Secondary | ICD-10-CM | POA: Diagnosis not present

## 2020-04-14 DIAGNOSIS — R0600 Dyspnea, unspecified: Secondary | ICD-10-CM

## 2020-04-14 DIAGNOSIS — M9903 Segmental and somatic dysfunction of lumbar region: Secondary | ICD-10-CM | POA: Diagnosis not present

## 2020-04-14 DIAGNOSIS — M5417 Radiculopathy, lumbosacral region: Secondary | ICD-10-CM | POA: Diagnosis not present

## 2020-04-14 DIAGNOSIS — M545 Low back pain: Secondary | ICD-10-CM | POA: Diagnosis not present

## 2020-04-14 DIAGNOSIS — M50322 Other cervical disc degeneration at C5-C6 level: Secondary | ICD-10-CM | POA: Diagnosis not present

## 2020-04-14 DIAGNOSIS — M542 Cervicalgia: Secondary | ICD-10-CM | POA: Diagnosis not present

## 2020-04-17 ENCOUNTER — Other Ambulatory Visit: Payer: Self-pay

## 2020-04-17 ENCOUNTER — Ambulatory Visit (INDEPENDENT_AMBULATORY_CARE_PROVIDER_SITE_OTHER): Payer: Medicare Other | Admitting: Acute Care

## 2020-04-17 ENCOUNTER — Ambulatory Visit (INDEPENDENT_AMBULATORY_CARE_PROVIDER_SITE_OTHER): Payer: Medicare Other

## 2020-04-17 ENCOUNTER — Encounter: Payer: Self-pay | Admitting: Acute Care

## 2020-04-17 VITALS — BP 122/84 | HR 83 | Temp 94.4°F | Ht 71.0 in | Wt 221.4 lb

## 2020-04-17 DIAGNOSIS — I429 Cardiomyopathy, unspecified: Secondary | ICD-10-CM

## 2020-04-17 DIAGNOSIS — Z9989 Dependence on other enabling machines and devices: Secondary | ICD-10-CM

## 2020-04-17 DIAGNOSIS — Z8616 Personal history of COVID-19: Secondary | ICD-10-CM | POA: Diagnosis not present

## 2020-04-17 DIAGNOSIS — G4733 Obstructive sleep apnea (adult) (pediatric): Secondary | ICD-10-CM

## 2020-04-17 DIAGNOSIS — R0600 Dyspnea, unspecified: Secondary | ICD-10-CM | POA: Diagnosis not present

## 2020-04-17 DIAGNOSIS — D869 Sarcoidosis, unspecified: Secondary | ICD-10-CM | POA: Diagnosis not present

## 2020-04-17 DIAGNOSIS — R634 Abnormal weight loss: Secondary | ICD-10-CM

## 2020-04-17 NOTE — Patient Instructions (Addendum)
It is good to see you today. We will collect some labs today to evaluate for sarcoidosis.( ACE, PR3,MPO,GBM,RF,SCL-70, CCP, ANA, SSA, SSB) We will refer to ILD clinic for review Continue annual eye exams, cards follow up, renal follow up.  Follow up telephone call in 2 weeks to go over labs, and ILD conference recommendations.   Continue on CPAP at bedtime. You appear to be benefiting from the treatment  Goal is to wear for at least 6 hours each night for maximal clinical benefit. Continue to work on weight loss, as the link between excess weight  and sleep apnea is well established.   Remember to establish a good bedtime routine, and work on sleep hygiene.  Limit daytime naps , avoid stimulants such as caffeine and nicotine close to bedtime, exercise daily to promote sleep quality, avoid heavy , spicy, fried , or rich foods before bed. Ensure adequate exposure to natural light during the day,establish a relaxing bedtime routine with a pleasant sleep environment ( Bedroom between 60 and 67 degrees, turn off bright lights , TV or device screens screens , consider black out curtains or white noise machines) Do not drive if sleepy. Remember to clean mask, tubing, filter, and reservoir once weekly with soapy water.  Follow up televisit  with Melissa Bellis NP   In 2-3 weeks  or before as needed.   Please place order for new CPAP face mask. Patient would like one that covers nose and mouth.

## 2020-04-17 NOTE — Progress Notes (Signed)
History of Present Illness Melissa [name] is a 69 y.o. female never smoker with OSA on CPAP, history of asthma as a child.She had COVID 19  04/2019. She did not require hospitalization, but she lost her husband to the virus. She has  having post COVID dyspnea . She is followed by Dr. Mortimer Fries.   04/17/2020  Pt. Presents for follow up for exertional dyspnea post Covid 19, and to review results of her CT Chest done as follow up to her 10/2019 scan to re-evaluate multiple stable irregular and spiculated densities are seen throughout both lungs which most likely represent chronic inflammation or postinfectious scarring. The CT done 04/12/2020 showed a spectrum of findings in the thorax which are very similar to prior examinations, strongly favored to reflect a systemic disease such as sarcoidosis. I gave the results to the patient , and she was surprised, but not surprised. 25-30 years ago had a biopsy for sarcoidosis. She  was told by her surgeon she did have sarcoid, but her PCP told her she did not. She has not had significant issues that she is aware of. She does have a history of bronchitis. Dr. She does have annual eye exams and she is followed by a cardiologist. She does have CKD stage III. She is agreeable to lab work to better diagnose, vs rule out another cause. I have spoken with Dr. Chase Caller, and he will present the case at the ILD conference.    Pt. Was Started on QVAR by Dr. Ashby Dawes , she is not sure why. Upon reading his note, he felt she had a degree of obstructive disease that he suspected is asthma. She did have systemic eosinophils in 2018 that were > 300. Marland Kitchen She states she has not been using it, as she is no longer experiencing exertional dyspnea. She has not had issues with bronchitis lately.   Pt. Is compliant with her CPAP machine. ( Auto Set 5-15 cm H2O) Down Load shows usage 27 of 30 days ( 90%) Average usage of 7 hours 26 minutes Average pressure 9.2/ Max pressure 12.9 AHI is  1.4 She would like a new mask.   Test Results: CT Chest 03/2020 Lungs/Pleura: Patchy areas of thickening of the peribronchovascular interstitium, cylindrical and varicose bronchiectasis, and peribronchovascular micro and macronodularity these are very similar in number and size, most evident throughout the mid to upper lungs bilaterally likely reflective of a chronic systemic disease. A few tiny subpleural micro nodules are also noted (i.e., overall distribution appears to be perilymphatic). There is a spectrum of findings in the thorax which are very similar to prior examinations, strongly favored to reflect a systemic disease such as sarcoidosis. Further clinical evaluation is recommended. Aortic atherosclerosis, in addition to 2 vessel coronary artery disease. Please note that although the presence of coronary artery calcium documents the presence of coronary artery disease, the severity of this disease and any potential stenosis cannot be assessed on this non-gated CT examination.   ** Covid + 04/2019>> No hospitalization **CT chest 05/01/2019>> bilateral scattered infiltrates, predominantly upper lobe. **HST 06/03/2018>> mild sleep apnea with AHI of 10. **chest x-ray7/6/18>>hyperinflation consistent with emphysema. **CBC 02/21/17>>eosinophils equals 300. 02/11/2019 Echo>> EF 50-55%, Pulmonary artery not well visualized.   PFT's 04/07/2020         CBC Latest Ref Rng & Units 09/06/2019 05/21/2019 05/04/2019  WBC 4.0 - 10.5 K/uL 4.6 4.8 3.4(L)  Hemoglobin 12.0 - 15.0 g/dL 12.2 12.0 12.6  Hematocrit 36 - 46 % 37.1 37.1 39.6  Platelets 150 - 400 K/uL 252 200 227    BMP Latest Ref Rng & Units 03/27/2020 05/21/2019 05/04/2019  Glucose 65 - 99 mg/dL 98 99 148(H)  BUN 8 - 27 mg/dL 14 18 24(H)  Creatinine 0.57 - 1.00 mg/dL 1.37(H) 1.46(H) 1.67(H)  BUN/Creat Ratio 12 - 28 10(L) - -  Sodium 134 - 144 mmol/L 142 140 139  Potassium 3.5 - 5.2 mmol/L 4.7 4.2 4.5  Chloride 96 - 106  mmol/L 108(H) 109 110  CO2 20 - 29 mmol/L 25 24 18(L)  Calcium 8.7 - 10.3 mg/dL 9.6 9.2 9.3    BNP No results found for: BNP  ProBNP No results found for: PROBNP  PFT    Component Value Date/Time   FEV1PRE 3.54 04/07/2020 1148   FEV1POST 3.55 04/07/2020 1148   FVCPRE 4.71 04/07/2020 1148   FVCPOST 4.61 04/07/2020 1148   TLC 7.48 04/07/2020 1148   DLCOUNC 20.30 04/07/2020 1148   PREFEV1FVCRT 75 04/07/2020 1148   PSTFEV1FVCRT 77 04/07/2020 1148    CT CHEST WO CONTRAST  Result Date: 04/12/2020 CLINICAL DATA:  69 year old female with history of pulmonary nodule. Followup study. EXAM: CT CHEST WITHOUT CONTRAST TECHNIQUE: Multidetector CT imaging of the chest was performed following the standard protocol without IV contrast. COMPARISON:  Chest CT 10/27/2019. FINDINGS: Cardiovascular: Heart size is normal. There is no significant pericardial fluid, thickening or pericardial calcification. There is aortic atherosclerosis, as well as atherosclerosis of the great vessels of the mediastinum and the coronary arteries, including calcified atherosclerotic plaque in the left anterior descending and left circumflex coronary arteries. Mediastinum/Nodes: Multiple borderline enlarged and mildly enlarged mediastinal and bilateral hilar lymph nodes, many of which are partially calcified, similar to the prior study. Esophagus is unremarkable in appearance. No axillary lymphadenopathy. Lungs/Pleura: Patchy areas of thickening of the peribronchovascular interstitium, cylindrical and varicose bronchiectasis, and peribronchovascular micro and macronodularity these are very similar in number and size, most evident throughout the mid to upper lungs bilaterally likely reflective of a chronic systemic disease. A few tiny subpleural micro nodules are also noted (i.e., overall distribution appears to be perilymphatic). Upper Abdomen: Postoperative changes in the upper abdomen of gastric bypass. Low-attenuation lesions in  both kidneys, incompletely characterized on today's non-contrast CT examination, but statistically likely to represent cysts. Nonobstructive calculi are noted within the collecting systems of both kidneys measuring up to 5 mm in the interpolar collecting system of left kidney. Musculoskeletal: There are no aggressive appearing lytic or blastic lesions noted in the visualized portions of the skeleton. IMPRESSION: 1. There is a spectrum of findings in the thorax which are very similar to prior examinations, strongly favored to reflect a systemic disease such as sarcoidosis. Further clinical evaluation is recommended. 2. Aortic atherosclerosis, in addition to 2 vessel coronary artery disease. Please note that although the presence of coronary artery calcium documents the presence of coronary artery disease, the severity of this disease and any potential stenosis cannot be assessed on this non-gated CT examination. Assessment for potential risk factor modification, dietary therapy or pharmacologic therapy may be warranted, if clinically indicated. 3. Nonobstructive calculi in the collecting systems of both kidneys measuring up to 5 mm in the interpolar collecting system of the left kidney. Aortic Atherosclerosis (ICD10-I70.0). Electronically Signed   By: Vinnie Langton M.D.   On: 04/12/2020 09:12     Past medical hx Past Medical History:  Diagnosis Date  . Bruising   . Cervical radiculopathy   . Deficiency of vitamin B   .  Depression   . Edema   . Flank pain   . Gross hematuria   . HTN (hypertension)   . Iron deficiency anemia   . Leukopenia   . OA (osteoarthritis)   . Obesity   . OSA on CPAP   . Osteopenia   . Perennial allergic rhinitis   . Renal cysts, acquired, bilateral   . RLS (restless legs syndrome)   . Sickle cell trait ([place])   . Snoring   . Spinal stenosis   . Trigeminal neuralgia      Social History   Tobacco Use  . Smoking status: Never Smoker  . Smokeless tobacco: Never  Used  Vaping Use  . Vaping Use: Never used  Substance Use Topics  . Alcohol use: Not Currently    Alcohol/week: 0.0 standard drinks  . Drug use: No    Ms.Schachter reports that she has never smoked. She has never used smokeless tobacco. She reports previous alcohol use. She reports that she does not use drugs.  Tobacco Cessation: Never smoker, but significant second hand smoke exposure  Past surgical hx, Family hx, Social hx all reviewed.  Current Outpatient Medications on File Prior to Visit  Medication Sig  . albuterol (VENTOLIN HFA) 108 (90 Base) MCG/ACT inhaler Inhale 2 puffs into the lungs every 6 (six) hours as needed for wheezing or shortness of breath.  . ASHWAGANDHA PO Take by mouth daily.   Marland Kitchen aspirin 81 MG EC tablet TAKE 1 TABLET BY MOUTH EVERY DAY  . atorvastatin (LIPITOR) 40 MG tablet Take 1 tablet (40 mg total) by mouth daily.  Marland Kitchen azelastine (ASTELIN) 0.1 % nasal spray 2 SPRAYS IN EACH NOSTRIL DAILY  . BLACK CURRANT SEED OIL PO Take by mouth daily.   . budesonide (PULMICORT) 0.25 MG/2ML nebulizer solution Take 0.25 mg by nebulization 2 (two) times daily. PRN only  . carbamazepine (TEGRETOL) 200 MG tablet Take 200 mg by mouth 3 (three) times daily as needed.  . Cholecalciferol (VITAMIN D3 ADULT GUMMIES PO) Take 2 each by mouth daily.   Mariane Baumgarten Sodium (DSS) 100 MG CAPS Take by mouth at bedtime.   . ferrous sulfate (IRON SUPPLEMENT) 325 (65 FE) MG tablet Take 325 mg by mouth daily with breakfast.  . fluticasone (FLONASE) 50 MCG/ACT nasal spray SPRAY 2 SPRAYS INTO EACH NOSTRIL EVERY DAY  . gabapentin (NEURONTIN) 100 MG capsule Take 100 mg by mouth at bedtime as needed.  . loratadine (CLARITIN) 10 MG tablet Take 1 tablet (10 mg total) by mouth daily.  Marland Kitchen losartan (COZAAR) 25 MG tablet TAKE 1 TABLET BY MOUTH EVERY DAY  . magnesium oxide (MAG-OX) 400 MG tablet Take 400 mg by mouth daily. Reported on 12/08/2015  . Multiple Vitamins-Minerals (CENTRUM SILVER) tablet Take 1 tablet by  mouth daily.  Marland Kitchen omeprazole (PRILOSEC) 20 MG capsule TAKE 1 CAPSULE BY MOUTH EVERY DAY  . TURMERIC PO Take by mouth daily.   . beclomethasone (QVAR REDIHALER) 40 MCG/ACT inhaler Inhale 1 puff into the lungs 2 (two) times daily. (Patient not taking: Reported on 04/17/2020)   No current facility-administered medications on file prior to visit.     Allergies  Allergen Reactions  . Contrast Media [Iodinated Diagnostic Agents]   . Shellfish Allergy     Edema    Review Of Systems:  Constitutional:   No  weight loss, night sweats,  Fevers, chills, fatigue, or  lassitude.  HEENT:   No headaches,  Difficulty swallowing,  Tooth/dental problems, or  Sore throat,  No sneezing, itching, ear ache, nasal congestion, post nasal drip,   CV:  No chest pain,  Orthopnea, PND, swelling in lower extremities, anasarca, dizziness, palpitations, syncope.   GI  No heartburn, indigestion, abdominal pain, nausea, vomiting, diarrhea, change in bowel habits, loss of appetite, bloody stools.   Resp: Rare  shortness of breath only with climbing stairs.None  or at rest.  No excess mucus, no productive cough,  No non-productive cough,  No coughing up of blood.  No change in color of mucus.  No wheezing.  No chest wall deformity  Skin: no rash or lesions.  GU: no dysuria, change in color of urine, no urgency or frequency.  No flank pain, no hematuria   MS:  No joint pain or swelling.  No decreased range of motion.  No back pain.  Psych:  No change in mood or affect. No depression or anxiety.  No memory loss.   Vital Signs BP 122/84 (BP Location: Left Arm, Cuff Size: Normal)   Pulse 83   Temp (!) 94.4 F (34.7 C) (Oral)   Ht 5\' 11"  (1.803 m)   Wt 221 lb 6.4 oz (100.4 kg)   SpO2 99%   BMI 30.88 kg/m    Physical Exam:  General- No distress,  A&Ox3, pleasant ENT: No sinus tenderness, TM clear, pale nasal mucosa, no oral exudate,no post nasal drip, no LAN Cardiac: S1, S2, regular rate and  rhythm, no murmur Chest: No wheeze/ rales/ dullness; no accessory muscle use, no nasal flaring, no sternal retractions Abd.: Soft Non-tender, ND, BS +, Body mass index is 30.88 kg/m. Ext: No clubbing cyanosis, slight ankle edema, no obvious deformities.  Neuro:  normal strength, A&O x 3, MAE x 4, appropriate Skin: No rashes, No lesions, warm and dry, intact Psych: normal mood and behavior   Assessment/Plan Chronic Inflammatory, scarring per Chest CT that favors sarcoid.  Previous history of biopsy for Sarcoid, but told she did not have it by PCP Plan We will collect some labs today to evaluate for sarcoidosis.( ACE, PR3,MPO,GBM,RF,SCL-70, CCP, ANA, SSA, SSB) We will refer to ILD clinic for review Continue annual eye exams, cards follow up, renal follow up.  Follow up telephone call in 2 weeks to go over labs, and ILD conference recommendations. ( E mailed Alethia Berthold for ILD conference)  OSA on CPAP Excellent  compliance AHI is well controlled  Plan Continue on CPAP at bedtime. You appear to be benefiting from the treatment  Goal is to wear for at least 6 hours each night for maximal clinical benefit. Continue to work on weight loss, as the link between excess weight  and sleep apnea is well established.   Remember to establish a good bedtime routine, and work on sleep hygiene.  Limit daytime naps , avoid stimulants such as caffeine and nicotine close to bedtime, exercise daily to promote sleep quality, avoid heavy , spicy, fried , or rich foods before bed. Ensure adequate exposure to natural light during the day,establish a relaxing bedtime routine with a pleasant sleep environment ( Bedroom between 60 and 67 degrees, turn off bright lights , TV or device screens screens , consider black out curtains or white noise machines) Do not drive if sleepy. Remember to clean mask, tubing, filter, and reservoir once weekly with soapy water.  Follow up televisit  with Analeese Andreatta NP   In 2-3 weeks  or  before as needed.   Please place order for new CPAP face mask. Patient would like one  that covers nose and mouth.    Follow up Tele visit to review labs, and ILD conference recommendations  Magdalen Spatz, NP 04/17/2020  11:08 AM

## 2020-04-18 LAB — ECHOCARDIOGRAM COMPLETE
Area-P 1/2: 4.31 cm2
Height: 71 in
S' Lateral: 3.3 cm
Weight: 3542.4 oz

## 2020-04-20 LAB — ANTI-NUCLEAR AB-TITER (ANA TITER): ANA Titer 1: 1:40 {titer} — ABNORMAL HIGH

## 2020-04-20 LAB — GLOMERULAR BASEMENT MEMBRANE ANTIBODIES: GBM Ab: <1 AI

## 2020-04-20 LAB — ANTI-SCLERODERMA ANTIBODY: Scleroderma (Scl-70) (ENA) Antibody, IgG: <1 AI

## 2020-04-20 LAB — CYCLIC CITRUL PEPTIDE ANTIBODY, IGG: Cyclic Citrullin Peptide Ab: <16 UNITS

## 2020-04-20 LAB — MPO/PR-3 (ANCA) ANTIBODIES
Myeloperoxidase Abs: <1 AI
Serine Protease 3: <1 AI

## 2020-04-20 LAB — RHEUMATOID FACTOR: Rheumatoid fact SerPl-aCnc: <14 IU/mL (ref <14)

## 2020-04-20 LAB — ANGIOTENSIN CONVERTING ENZYME: Angiotensin-Converting Enzyme: 57 U/L (ref 9–67)

## 2020-04-20 LAB — SJOGRENS SYNDROME-A EXTRACTABLE NUCLEAR ANTIBODY: SSA (Ro) (ENA) Antibody, IgG: <1 AI

## 2020-04-20 LAB — ANA: Anti Nuclear Antibody (ANA): POSITIVE — AB

## 2020-04-20 LAB — SJOGRENS SYNDROME-B EXTRACTABLE NUCLEAR ANTIBODY: SSB (La) (ENA) Antibody, IgG: <1 AI

## 2020-04-20 NOTE — Progress Notes (Signed)
These results were reviewed with the patient at the time of her OV 04/17/2020. She verbalized understanding. Please review OV note.

## 2020-04-21 DIAGNOSIS — M5137 Other intervertebral disc degeneration, lumbosacral region: Secondary | ICD-10-CM | POA: Diagnosis not present

## 2020-04-21 DIAGNOSIS — M50322 Other cervical disc degeneration at C5-C6 level: Secondary | ICD-10-CM | POA: Diagnosis not present

## 2020-04-21 DIAGNOSIS — M9901 Segmental and somatic dysfunction of cervical region: Secondary | ICD-10-CM | POA: Diagnosis not present

## 2020-04-21 DIAGNOSIS — M9903 Segmental and somatic dysfunction of lumbar region: Secondary | ICD-10-CM | POA: Diagnosis not present

## 2020-04-21 DIAGNOSIS — M9902 Segmental and somatic dysfunction of thoracic region: Secondary | ICD-10-CM | POA: Diagnosis not present

## 2020-04-21 DIAGNOSIS — M545 Low back pain: Secondary | ICD-10-CM | POA: Diagnosis not present

## 2020-04-21 DIAGNOSIS — M542 Cervicalgia: Secondary | ICD-10-CM | POA: Diagnosis not present

## 2020-04-21 DIAGNOSIS — M5417 Radiculopathy, lumbosacral region: Secondary | ICD-10-CM | POA: Diagnosis not present

## 2020-04-25 DIAGNOSIS — F411 Generalized anxiety disorder: Secondary | ICD-10-CM | POA: Diagnosis not present

## 2020-04-27 ENCOUNTER — Other Ambulatory Visit: Payer: Self-pay | Admitting: Acute Care

## 2020-04-27 ENCOUNTER — Ambulatory Visit: Payer: Medicare Other | Admitting: Family Medicine

## 2020-04-28 DIAGNOSIS — M545 Low back pain: Secondary | ICD-10-CM | POA: Diagnosis not present

## 2020-04-28 DIAGNOSIS — M50322 Other cervical disc degeneration at C5-C6 level: Secondary | ICD-10-CM | POA: Diagnosis not present

## 2020-04-28 DIAGNOSIS — M9901 Segmental and somatic dysfunction of cervical region: Secondary | ICD-10-CM | POA: Diagnosis not present

## 2020-04-28 DIAGNOSIS — M5417 Radiculopathy, lumbosacral region: Secondary | ICD-10-CM | POA: Diagnosis not present

## 2020-04-28 DIAGNOSIS — M9903 Segmental and somatic dysfunction of lumbar region: Secondary | ICD-10-CM | POA: Diagnosis not present

## 2020-04-28 DIAGNOSIS — M5137 Other intervertebral disc degeneration, lumbosacral region: Secondary | ICD-10-CM | POA: Diagnosis not present

## 2020-04-28 DIAGNOSIS — M9902 Segmental and somatic dysfunction of thoracic region: Secondary | ICD-10-CM | POA: Diagnosis not present

## 2020-04-28 DIAGNOSIS — M542 Cervicalgia: Secondary | ICD-10-CM | POA: Diagnosis not present

## 2020-05-01 ENCOUNTER — Encounter: Payer: Self-pay | Admitting: Acute Care

## 2020-05-01 ENCOUNTER — Ambulatory Visit (INDEPENDENT_AMBULATORY_CARE_PROVIDER_SITE_OTHER): Payer: Medicare Other | Admitting: Acute Care

## 2020-05-01 ENCOUNTER — Other Ambulatory Visit: Payer: Self-pay

## 2020-05-01 DIAGNOSIS — R0600 Dyspnea, unspecified: Secondary | ICD-10-CM

## 2020-05-01 DIAGNOSIS — R0609 Other forms of dyspnea: Secondary | ICD-10-CM

## 2020-05-01 NOTE — Patient Instructions (Addendum)
It was great to speak with you today. We will present your case at the ILD Conference 05/02/2020 1 week follow up to review the assessment and recommendations of the ILD Panel Annual eye exam Continue on CPAP at bedtime. You appear to be benefiting from the treatment  Goal is to wear for at least 6 hours each night for maximal clinical benefit. Continue to work on weight loss, as the link between excess weight  and sleep apnea is well established.   Remember to establish a good bedtime routine, and work on sleep hygiene.  Limit daytime naps , avoid stimulants such as caffeine and nicotine close to bedtime, exercise daily to promote sleep quality, avoid heavy , spicy, fried , or rich foods before bed. Ensure adequate exposure to natural light during the day,establish a relaxing bedtime routine with a pleasant sleep environment ( Bedroom between 60 and 67 degrees, turn off bright lights , TV or device screens screens , consider black out curtains or white noise machines) Do not drive if sleepy. Remember to clean mask, tubing, filter, and reservoir once weekly with soapy water.  Follow up as noted above. Please contact office for sooner follow up if symptoms do not improve or worsen or seek emergency care

## 2020-05-01 NOTE — Progress Notes (Signed)
Virtual Visit via Video Note  I connected with Melissa [name] on 05/01/20 at 11:30 AM EDT by a video enabled telemedicine application and verified that I am speaking with the correct person using two identifiers.  Location: Patient: At home Provider: [address], [place], [place], [address]   I discussed the limitations of evaluation and management by telemedicine and the availability of in person appointments. The patient expressed understanding and agreed to proceed.   Synopsis Melissa [name] is a 69 y.o. female never smoker with OSA on CPAP, history of asthma as a child.She had COVID199/2020. She did not require hospitalization, but she lost her husband to the virus.[id number] post COVID dyspnea. She is followed by Dr. Mortimer Fries.   History of Present Illness: Pt. Presents for follow up. She was last seen in the office 04/17/2020, and she had a CT done 04/12/2020 strongly favored to reflect a systemic disease such as sarcoidosis, which she had been told she had in the past by a surgeon ( she had biopsy in [place] 25-30 years ago)but her PCP at the time  told her she did not have it. She has not had any significant issues over the years, so she assumed she did not have sarcoidosis. After the CT findings, she requested a  work up to determine if she does have sarcoid after all.She has had recent onset of exertional dyspnea.  .  We did  Basic auto immune  panel, which was negative for everything with the exception of ANA which was positive with a titer of 1:40. She has had little to no issues over the years, despite  her CT imaging Called patient. Pt. Is scheduled to be reviewed at the ILD conference 05/02/2020.    She is compliant with her CPAP every night. NO daytime sleepiness.    Observations/Objective:         04/12/2020 CT Chest WO Contrast  There is a spectrum of findings in the thorax which are very similar to prior examinations, strongly favored to reflect  a systemic disease such as sarcoidosis. Further clinical evaluation is recommended. 2. Aortic atherosclerosis, in addition to 2 vessel coronary artery disease. Please note that although the presence of coronary artery calcium documents the presence of coronary artery disease, the severity of this disease and any potential stenosis cannot be assessed on this non-gated CT examination. Assessment for potential risk factor modification, dietary therapy or pharmacologic therapy may be warranted, if clinically indicated. 3. Nonobstructive calculi in the collecting systems of both kidneys measuring up to 5 mm in the interpolar collecting system of the left Kidney.  Echo 02/11/2020 The left ventricle has low normal systolic function, with an ejection  fraction of 50-55%. The cavity size was normal. Left ventricular diastolic  parameters were normal. No evidence of left ventricular regional wall  motion abnormalities.  2. The right ventricle has normal systolic function. The cavity was  normal. There is no increase in right ventricular wall thickness. Right  ventricular systolic pressure is normal with an estimated pressure of 22.0  mmHg.  3. Right atrial size was mildly dilated.  4. The tricuspid valve is grossly normal.  5. The aortic valve was not well visualized.  6. The aortic root and ascending aorta are normal in size and structure.   Assessment and Plan: ? Sarcoid Auto Immune Work up negative with exception of ANA ( 1:40) Plan We will present your case at the ILD Conference 05/02/2020 1 week follow up to review  the assessment and recommendations of the ILD Panel Annual eye exam Call if you need [place] sooner    OSA on CPAP Great compliance  Plan Continue on CPAP at bedtime. You appear to be benefiting from the treatment  Goal is to wear for at least 6 hours each night for maximal clinical benefit. Continue to work on weight loss, as the link between excess weight  and sleep  apnea is well established.   Remember to establish a good bedtime routine, and work on sleep hygiene.  Limit daytime naps , avoid stimulants such as caffeine and nicotine close to bedtime, exercise daily to promote sleep quality, avoid heavy , spicy, fried , or rich foods before bed. Ensure adequate exposure to natural light during the day,establish a relaxing bedtime routine with a pleasant sleep environment ( Bedroom between 60 and 67 degrees, turn off bright lights , TV or device screens screens , consider black out curtains or white noise machines) Do not drive if sleepy. Remember to clean mask, tubing, filter, and reservoir once weekly with soapy water.  Follow up as noted above. Please contact office for sooner follow up if symptoms do not improve or worsen or seek emergency care      Follow Up Instructions: Follow up tele visit in 1 week to  review the assessment and recommendations of the ILD Panel Call if you need [place] sooner   I discussed the assessment and treatment plan with the patient. The patient was provided an opportunity to ask questions and all were answered. The patient agreed with the plan and demonstrated an understanding of the instructions.   The patient was advised to call back or seek an in-person evaluation if the symptoms worsen or if the condition fails to improve as anticipated.  I provided 21 minutes of non-face-to-face time during this encounter.   Magdalen Spatz, NP 05/01/2020 12:12 PM

## 2020-05-02 ENCOUNTER — Ambulatory Visit (INDEPENDENT_AMBULATORY_CARE_PROVIDER_SITE_OTHER): Payer: Medicare Other | Admitting: Internal Medicine

## 2020-05-02 DIAGNOSIS — D869 Sarcoidosis, unspecified: Secondary | ICD-10-CM

## 2020-05-03 NOTE — Progress Notes (Signed)
Interstitial Lung Disease Multidisciplinary Conference   Melissa [name]    MRN [id number]    DOB 09-11-1950  Primary Care Physician:Sowles, Drue Stager, MD  Referring Physician: Eric Form NP  Time of Conference: 7.30am- 8.30am Date of conference: 05/02/20 Location of Conference: -  Virtual  Participating Pulmonary: Dr. Brand Males, MD - yes,  Dr Marshell Garfinkel, MD - yes Pathology: Dr Jaquita Folds, MD - yes , Dr Enid Cutter - no Radiology: Dr Salvatore Marvel MD - n, Dr Vinnie Langton MD - no,  Dr Lorin Picket, MD - no , Dr Eddie Candle - yes Others: no  Brief History: She had Covid 04/2019.  We have been following CT's and some nodules since her Covid , and the last CT is suspicious for Sarcoid. Looking back at previous CT's ( 2012, 2020, 2021) it appears that these changes are chronic.  She has no significant symptoms. She told me that 25-30 years ago she had a biopsy in [place]. She said the surgeon told her she did have sarcoid, and her PCP told her she did not. She assumed she did not and she has not had any further work up.   Serology: negative aug 2021  MDD discussion of CT scan   - 2021 ct to old  Autoimmune negative end aug 2021. Calcified nodes c/w sarcoid in mediastinum. On lung images -> sarcoid can be very variable. . Has "interesting" but NOT usual findings of sarcoid. Has many spiculated nodules centered around airway. (normal sarocid -> perilymphatic - this one is airway) UL predmoninance almost entirely. . DX _> UNUSUAL and NOT TYPICAL for Sarcoid but SARcoid is protean so can be c/w Sarcoid.  At lung base -> there might be some fibrosis (retic, bronchiecleatisis, bilateral. In past . Now in present worse over 8-9 years and defiite concern for ILD     Pathology discussion of biopsy *  PFTs:  PFT Results Latest Ref Rng & Units 04/07/2020  FVC-Pre L 4.71  FVC-Predicted Pre % 143  FVC-Post L 4.61  FVC-Predicted Post % 140  Pre FEV1/FVC % % 75  Post  FEV1/FCV % % 77  FEV1-Pre L 3.54  FEV1-Predicted Pre % 137  FEV1-Post L 3.55  DLCO uncorrected ml/min/mmHg 20.30  DLCO UNC% % 82  DLCO corrected ml/min/mmHg 20.18  DLCO COR %Predicted % 81  DLVA Predicted % 70  TLC L 7.48  TLC % Predicted % 122  RV % Predicted % 113     Labs: *x  MDD Impression/Recs:   IF there is clinical concern -> consider BAL or tissue sampling. At BASE -> might be something different - definite ILD. ILD worse since 2012. Does not look like fibrotic Sarcoid.  Has basal ILD - some in 2012. No findings of post infectious covid    D/w APP - refer to ILD program. Dr Chase Caller will see   Time Spent in preparation and discussion:  > 84 min    SIGNATURE   Dr. Brand Males, M.D., F.C.C.P,  Pulmonary and Critical Care Medicine Staff Physician, [place] Director - Interstitial Lung Disease  Program  [facility] [place] [place], [place], [address]  Pager: [phone], If no answer or between  15:00h - 7:00h: call [phone] Telephone: [phone]  3:58 PM 05/03/2020 ...................................................................................................................Marland Kitchen References: Diagnosis of Hypersensitivity Pneumonitis in Adults. An Official ATS/JRS/ALAT Clinical Practice Guideline. Ragu G et al, [place]  Med. 2020 Aug 1;202(3):e36-e69.       Diagnosis of Idiopathic Pulmonary Fibrosis. An Official ATS/ERS/JRS/ALAT Clinical Practice Guideline. Raghu G et al, [place]. 2018 Sep 1;198(5):e44-e68.   IPF Suspected   Histopath ology Pattern      UIP  Probable UIP  Indeterminate for  UIP  Alternative  diagnosis    UIP  IPF  IPF  IPF  Non-IPF dx   HRCT   Probabe UIP  IPF  IPF  IPF (Likely)**  Non-IPF dx  Pattern  Indeterminate for UIP  IPF  IPF (Likely)**  Indeterminate  for IPF**  Non-IPF dx    Alternative  diagnosis  IPF (Likely)**/ non-IPF dx  Non-IPF dx  Non-IPF dx  Non-IPF dx     Idiopathic pulmonary fibrosis diagnosis based upon HRCT and Biopsy paterns.  ** IPF is the likely diagnosis when any of following features are present:  . Moderate-to-severe traction bronchiectasis/bronchiolectasis (defined as mild traction bronchiectasis/bronchiolectasis in four or more lobes including the lingual as a lobe, or moderate to severe traction bronchiectasis in two or more lobes) in a man over age 45 years or in a woman over age 64 years . Extensive (>30%) reticulation on HRCT and an age >70 years  . Increased neutrophils and/or absence of lymphocytosis in BAL fluid  . Multidisciplinary discussion reaches a confident diagnosis of IPF.   **Indeterminate for IPF  . Without an adequate biopsy is unlikely to be IPF  . With an adequate biopsy may be reclassified to a more specific diagnosis after multidisciplinary discussion and/or additional consultation.   dx = diagnosis; HRCT = high-resolution computed tomography; IPF = idiopathic pulmonary fibrosis; UIP = usual interstitial pneumonia.

## 2020-05-05 ENCOUNTER — Other Ambulatory Visit: Payer: Self-pay | Admitting: Internal Medicine

## 2020-05-08 DIAGNOSIS — M5417 Radiculopathy, lumbosacral region: Secondary | ICD-10-CM | POA: Diagnosis not present

## 2020-05-08 DIAGNOSIS — M5137 Other intervertebral disc degeneration, lumbosacral region: Secondary | ICD-10-CM | POA: Diagnosis not present

## 2020-05-08 DIAGNOSIS — M542 Cervicalgia: Secondary | ICD-10-CM | POA: Diagnosis not present

## 2020-05-08 DIAGNOSIS — M545 Low back pain: Secondary | ICD-10-CM | POA: Diagnosis not present

## 2020-05-08 DIAGNOSIS — M9901 Segmental and somatic dysfunction of cervical region: Secondary | ICD-10-CM | POA: Diagnosis not present

## 2020-05-08 DIAGNOSIS — M9903 Segmental and somatic dysfunction of lumbar region: Secondary | ICD-10-CM | POA: Diagnosis not present

## 2020-05-08 DIAGNOSIS — M9902 Segmental and somatic dysfunction of thoracic region: Secondary | ICD-10-CM | POA: Diagnosis not present

## 2020-05-08 DIAGNOSIS — M50322 Other cervical disc degeneration at C5-C6 level: Secondary | ICD-10-CM | POA: Diagnosis not present

## 2020-05-09 DIAGNOSIS — F411 Generalized anxiety disorder: Secondary | ICD-10-CM | POA: Diagnosis not present

## 2020-05-10 ENCOUNTER — Other Ambulatory Visit: Payer: Self-pay

## 2020-05-10 ENCOUNTER — Other Ambulatory Visit: Payer: Self-pay | Admitting: Acute Care

## 2020-05-10 ENCOUNTER — Encounter: Payer: Self-pay | Admitting: Acute Care

## 2020-05-10 ENCOUNTER — Ambulatory Visit (INDEPENDENT_AMBULATORY_CARE_PROVIDER_SITE_OTHER): Payer: Medicare Other | Admitting: Acute Care

## 2020-05-10 DIAGNOSIS — D869 Sarcoidosis, unspecified: Secondary | ICD-10-CM

## 2020-05-10 DIAGNOSIS — J849 Interstitial pulmonary disease, unspecified: Secondary | ICD-10-CM

## 2020-05-10 DIAGNOSIS — R5383 Other fatigue: Secondary | ICD-10-CM

## 2020-05-10 DIAGNOSIS — Z862 Personal history of diseases of the blood and blood-forming organs and certain disorders involving the immune mechanism: Secondary | ICD-10-CM

## 2020-05-10 NOTE — Progress Notes (Signed)
Virtual Visit via Video Note  I connected with Melissa [name] on 05/10/20 at  4:00 PM EDT by a video enabled telemedicine application and verified that I am speaking with the correct person using two identifiers.  Location: Patient: At home Provider:  [facility], [place], [place], [address]   I discussed the limitations of evaluation and management by telemedicine and the availability of in person appointments. The patient expressed understanding and agreed to proceed.  Synopsis Melissa Burbage Burkeis a 69 y.o.femalenever smokerwithOSA on CPAP, history of asthma as a child.She had COVID199/2020. She did not require hospitalization, but she lost her husband to the virus.[id number] post COVID dyspnea. She is followed by Dr. Mortimer Fries  History of Present Illness: Pt. Presents for follow up.This appointment is to review the findings of the ILD conference on 05/02/2020  after reviewing the patients CT Chest. Scan was reviewed at my request for findings consistent with Sarcoidosis. Pt. Did undergo a biopsy in [place] about 25 years ago, and her surgeon told her she did have Sarcoid, but her PCP told her she did not. She has not had any issues since, but after her Covid 19 illness, she did have slow to resolve dyspnea. CT Chest was done to evaluate for another cause of Dyspnea. It was consistent with Sarcoid, therefore the scan was reviewed in ILD conference.    Observations/Objective: Participating Pulmonary: Dr. Brand Males, MD - yes,  Dr Marshell Garfinkel, MD - yes Pathology: Dr Jaquita Folds, MD - yes , Dr Enid Cutter - no Radiology: Dr Salvatore Marvel MD - n, Dr Vinnie Langton MD - no,  Dr Lorin Picket, MD - no , Dr Eddie Candle - yes Others: no  Brief History: She had Covid 04/2019.  We have been following CT's and some nodules since her Covid , and the last CT is suspicious for Sarcoid. Looking back at previous CT's ( 2012, 2020, 2021) it appears that these changes are chronic.    She has no significant symptoms. She told me that 25-30 years ago she had a biopsy in [place]. She said the surgeon told her she did have sarcoid, and her PCP told her she did not. She assumed she did not and she has not had any further work up.   Serology: negative aug 2021  MDD discussion of CT scan   - 2021 ct to old  Autoimmune negative end aug 2021. Calcified nodes c/w sarcoid in mediastinum. On lung images -> sarcoid can be very variable. . Has "interesting" but NOT usual findings of sarcoid. Has many spiculated nodules centered around airway. (normal sarocid -> perilymphatic - this one is airway) UL predmoninance almost entirely. . DX _> UNUSUAL and NOT TYPICAL for Sarcoid but SARcoid is protean so can be c/w Sarcoid.  At lung base -> there might be some fibrosis (retic, bronchiecleatisis, bilateral. In past . Now in present worse over 8-9 years and defiite concern for ILD  Plan Have patient complete ILD questionnaire Follow up with Dr. Vaughan Browner or Chase Caller at [place] availability. Not classic sarcoid presentation. Sarcoid nodules have become larger over time( last 8-9 years)   Pathology discussion of biopsy *  PFTs:  PFT Results Latest Ref Rng & Units 04/07/2020  FVC-Pre L 4.71  FVC-Predicted Pre % 143  FVC-Post L 4.61  FVC-Predicted Post % 140  Pre FEV1/FVC % % 75  Post FEV1/FCV % % 77  FEV1-Pre L 3.54  FEV1-Predicted Pre % 137  FEV1-Post L 3.55  DLCO  uncorrected ml/min/mmHg 20.30  DLCO UNC% % 82  DLCO corrected ml/min/mmHg 20.18  DLCO COR %Predicted % 81  DLVA Predicted % 70  TLC L 7.48  TLC % Predicted % 122  RV % Predicted % 113     Labs:   MDD Impression/Recs:   IF there is clinical concern -> consider BAL or tissue sampling. At BASE -> might be something different - definite ILD. ILD worse since 2012. Does not look like fibrotic Sarcoid.  Has basal ILD - some in 2012. No findings of post infectious covid    D/w APP - refer to ILD program.  Dr Chase Caller will see   Time Spent in preparation and discussion:  > 30 min  Down Load CPAP        Assessment and Plan: Possible ILD / + Sarcoid per CT 04/2020 Minimal clinical manifestations Plan Complete ILD questionnaire  Follow up with Dr. Vaughan Browner or Chase Caller at first available to discuss further.    Follow Up Instructions: Follow up with Dr. Vaughan Browner or Chase Caller at first available to discuss further.  Discuss BAL for diagnosis   I discussed the assessment and treatment plan with the patient. The patient was provided an opportunity to ask questions and all were answered. The patient agreed with the plan and demonstrated an understanding of the instructions.   The patient was advised to call back or seek an in-person evaluation if the symptoms worsen or if the condition fails to improve as anticipated.  I provided 30  minutes of non-face-to-face time during this encounter.   Magdalen Spatz, NP 05/10/2020 4:32 PM

## 2020-05-10 NOTE — Patient Instructions (Addendum)
It was good to talk with you today. The ILD Conference reviewed your CT Chest. They confirmed the scan is consistent with sarcoid. There was also some notation of questionable Interstitial Lung Disease ( ILD) There are changes in the bases of your lungs that have progressed over the last 8-9 years.  These are the areas concerning for ILD. The good news is that you do not have clinical signs and symptoms . We do need to continue to monitor these for progression, and to make sure you do not have clinical symptoms. We will mail you and ILD questionnaire to complete. This may give [place] some answers.  We will order a CBC for your fatigue.  We will call you with results Follow up with Dr. Vaughan Browner or Dr. Chase Caller at their first available appointment to discuss this with you further.  Please contact office for sooner follow up if symptoms do not improve or worsen or seek emergency care

## 2020-05-11 ENCOUNTER — Other Ambulatory Visit
Admission: RE | Admit: 2020-05-11 | Discharge: 2020-05-11 | Disposition: A | Discharge status: Home / Self Care | Payer: Medicare Other | Attending: Acute Care | Admitting: Acute Care

## 2020-05-11 ENCOUNTER — Other Ambulatory Visit: Payer: Self-pay

## 2020-05-11 DIAGNOSIS — R5383 Other fatigue: Secondary | ICD-10-CM | POA: Insufficient documentation

## 2020-05-11 LAB — CBC WITH DIFFERENTIAL/PLATELET
Abs Immature Granulocytes: 0.02 10*3/uL (ref 0.00–0.07)
Basophils Absolute: 0 10*3/uL (ref 0.0–0.1)
Basophils Relative: 1 %
Eosinophils Absolute: 0.2 10*3/uL (ref 0.0–0.5)
Eosinophils Relative: 3 %
HCT: 35.8 % — ABNORMAL LOW (ref 36.0–46.0)
Hemoglobin: 11.8 g/dL — ABNORMAL LOW (ref 12.0–15.0)
Immature Granulocytes: 0 %
Lymphocytes Relative: 45 %
Lymphs Abs: 2.6 10*3/uL (ref 0.7–4.0)
MCH: 29.9 pg (ref 26.0–34.0)
MCHC: 33 g/dL (ref 30.0–36.0)
MCV: 90.9 fL (ref 80.0–100.0)
Monocytes Absolute: 0.7 10*3/uL (ref 0.1–1.0)
Monocytes Relative: 12 %
Neutro Abs: 2.2 10*3/uL (ref 1.7–7.7)
Neutrophils Relative %: 39 %
Platelets: 221 10*3/uL (ref 150–400)
RBC: 3.94 MIL/uL (ref 3.87–5.11)
RDW: 12.4 % (ref 11.5–15.5)
WBC: 5.7 10*3/uL (ref 4.0–10.5)
nRBC: 0 % (ref 0.0–0.2)

## 2020-05-11 NOTE — Progress Notes (Signed)
These results were reviewed during an office visit. She verbalized understanding

## 2020-05-11 NOTE — Addendum Note (Signed)
Addended by: Santiago Bur on: 05/11/2020 08:31 AM   Modules accepted: Orders

## 2020-05-11 NOTE — Progress Notes (Signed)
These results have been called to the patient. We discussed that she was a little anemic. She is re-starting her iron tablets. We also discussed use of Miralax and colace to help with constipation. She will need a follow up CBC to ensure amenia is correcting, and possibly blood iron levels.She verbalized understanding

## 2020-05-12 DIAGNOSIS — M50322 Other cervical disc degeneration at C5-C6 level: Secondary | ICD-10-CM | POA: Diagnosis not present

## 2020-05-12 DIAGNOSIS — Z23 Encounter for immunization: Secondary | ICD-10-CM | POA: Diagnosis not present

## 2020-05-12 DIAGNOSIS — M545 Low back pain: Secondary | ICD-10-CM | POA: Diagnosis not present

## 2020-05-12 DIAGNOSIS — M5417 Radiculopathy, lumbosacral region: Secondary | ICD-10-CM | POA: Diagnosis not present

## 2020-05-12 DIAGNOSIS — M5137 Other intervertebral disc degeneration, lumbosacral region: Secondary | ICD-10-CM | POA: Diagnosis not present

## 2020-05-12 DIAGNOSIS — M9901 Segmental and somatic dysfunction of cervical region: Secondary | ICD-10-CM | POA: Diagnosis not present

## 2020-05-12 DIAGNOSIS — M9903 Segmental and somatic dysfunction of lumbar region: Secondary | ICD-10-CM | POA: Diagnosis not present

## 2020-05-12 DIAGNOSIS — M9902 Segmental and somatic dysfunction of thoracic region: Secondary | ICD-10-CM | POA: Diagnosis not present

## 2020-05-12 DIAGNOSIS — M542 Cervicalgia: Secondary | ICD-10-CM | POA: Diagnosis not present

## 2020-05-17 ENCOUNTER — Ambulatory Visit: Payer: Medicare Other | Admitting: Internal Medicine

## 2020-05-18 ENCOUNTER — Ambulatory Visit (INDEPENDENT_AMBULATORY_CARE_PROVIDER_SITE_OTHER): Payer: Medicare Other | Admitting: Gastroenterology

## 2020-05-18 ENCOUNTER — Encounter: Payer: Self-pay | Admitting: Gastroenterology

## 2020-05-18 VITALS — BP 136/83 | HR 92 | Temp 98.2°F | Ht 71.0 in | Wt 218.2 lb

## 2020-05-18 DIAGNOSIS — K5904 Chronic idiopathic constipation: Secondary | ICD-10-CM

## 2020-05-18 DIAGNOSIS — K219 Gastro-esophageal reflux disease without esophagitis: Secondary | ICD-10-CM

## 2020-05-18 NOTE — Progress Notes (Signed)
Primary Care Physician: Steele Sizer, MD  Primary Gastroenterologist:  Dr. Lucilla Lame  Chief Complaint  Patient presents with  . Gastroesophageal Reflux    HPI: FREDNA STRICKER is a 69 y.o. female here for follow-up with a history of constipation and GERD.  The patient had a colonoscopy in 2020 by Dr. Marius Ditch and at that time was found to have multiple adenomas and was requested to have a repeat colonoscopy in 3 years.  The patient has been taking MiraLAX and Colace for her constipation. The patient reports that she wakes up middle night with burning in her throat.  She is presently on omeprazole.  She also states that she has food coming up when she lays down at night.  The patient has been taking her omeprazole in the evening.  There is no report of any unexplained weight loss fevers chills nausea or vomiting.  Past Medical History:  Diagnosis Date  . Bruising   . Cervical radiculopathy   . Deficiency of vitamin B   . Depression   . Edema   . Flank pain   . Gross hematuria   . HTN (hypertension)   . Iron deficiency anemia   . Leukopenia   . OA (osteoarthritis)   . Obesity   . OSA on CPAP   . Osteopenia   . Perennial allergic rhinitis   . Renal cysts, acquired, bilateral   . RLS (restless legs syndrome)   . Sickle cell trait ([place])   . Snoring   . Spinal stenosis   . Trigeminal neuralgia     Current Outpatient Medications  Medication Sig Dispense Refill  . albuterol (VENTOLIN HFA) 108 (90 Base) MCG/ACT inhaler Inhale 2 puffs into the lungs every 6 (six) hours as needed for wheezing or shortness of breath. 18 g 0  . ASHWAGANDHA PO Take by mouth daily.     Marland Kitchen aspirin 81 MG EC tablet TAKE 1 TABLET BY MOUTH EVERY DAY 30 tablet 0  . atorvastatin (LIPITOR) 40 MG tablet Take 1 tablet (40 mg total) by mouth daily. 90 tablet 1  . azelastine (ASTELIN) 0.1 % nasal spray 2 SPRAYS IN EACH NOSTRIL DAILY 90 mL 0  . BLACK CURRANT SEED OIL PO Take by mouth daily.     . budesonide  (PULMICORT) 0.25 MG/2ML nebulizer solution Take 0.25 mg by nebulization 2 (two) times daily. PRN only    . carbamazepine (TEGRETOL) 200 MG tablet Take 200 mg by mouth 3 (three) times daily as needed.    . Cholecalciferol (VITAMIN D3 ADULT GUMMIES PO) Take 2 each by mouth daily.     Mariane Baumgarten Sodium (DSS) 100 MG CAPS Take by mouth at bedtime.     . ferrous sulfate (IRON SUPPLEMENT) 325 (65 FE) MG tablet Take 325 mg by mouth daily with breakfast.    . fluticasone (FLONASE) 50 MCG/ACT nasal spray SPRAY 2 SPRAYS INTO EACH NOSTRIL EVERY DAY 48 mL 1  . gabapentin (NEURONTIN) 100 MG capsule Take 100 mg by mouth at bedtime as needed.    . loratadine (CLARITIN) 10 MG tablet Take 1 tablet (10 mg total) by mouth daily. 90 tablet 0  . losartan (COZAAR) 25 MG tablet TAKE 1 TABLET BY MOUTH EVERY DAY 90 tablet 3  . magnesium oxide (MAG-OX) 400 MG tablet Take 400 mg by mouth daily. Reported on 12/08/2015    . Multiple Vitamins-Minerals (CENTRUM SILVER) tablet Take 1 tablet by mouth daily. 30 tablet 0  . omeprazole (PRILOSEC) 20 MG  capsule TAKE 1 CAPSULE BY MOUTH EVERY DAY 30 capsule 0  . TURMERIC PO Take by mouth daily.      No current facility-administered medications for this visit.    Allergies as of 05/18/2020 - Review Complete 05/18/2020  Allergen Reaction Noted  . Contrast media [iodinated diagnostic agents]  04/17/2015  . Shellfish allergy  04/17/2015    ROS:  General: Negative for anorexia, weight loss, fever, chills, fatigue, weakness. ENT: Negative for hoarseness, difficulty swallowing , nasal congestion. CV: Negative for chest pain, angina, palpitations, dyspnea on exertion, peripheral edema.  Respiratory: Negative for dyspnea at rest, dyspnea on exertion, cough, sputum, wheezing.  GI: See history of present illness. GU:  Negative for dysuria, hematuria, urinary incontinence, urinary frequency, nocturnal urination.  Endo: Negative for unusual weight change.    Physical Examination:   BP  136/83   Pulse 92   Temp 98.2 F (36.8 C) (Oral)   Ht 5\' 11"  (1.803 m)   Wt 218 lb 3.2 oz (99 kg)   BMI 30.43 kg/m   General: Well-nourished, well-developed in no acute distress.  Eyes: No icterus. Conjunctivae pink. Lungs: Clear to auscultation bilaterally. Non-labored. Heart: Regular rate and rhythm, no murmurs rubs or gallops.  Abdomen: Bowel sounds are normal, nontender, nondistended, no hepatosplenomegaly or masses, no abdominal bruits or hernia , no rebound or guarding.   Extremities: No lower extremity edema. No clubbing or deformities. Neuro: Alert and oriented x 3.  Grossly intact. Skin: Warm and dry, no jaundice.   Psych: Alert and cooperative, normal mood and affect.  Labs:    Imaging Studies: No results found.  Assessment and Plan:   DANIAL SISLEY is a 69 y.o. y/o female who comes in today with a history of constipation.  The patient has been given samples of Linzess at 72 mcg and 145 mcg and she will try the lower dose and if it does not work she will take the higher dose.  The patient will also be given samples of Dexilant to be taken approximately 4 hours before she goes to sleep to see if this helps her heartburn.  The patient does not have any worry symptoms such as dysphagia.  She has been told that regurgitation may not improve but what she regurgitates should not be acidic and burning.  The patient has been explained the plan and agrees with it.     Lucilla Lame, MD. Marval Regal    Note: This dictation was prepared with Dragon dictation along with smaller phrase technology. Any transcriptional errors that result from this process are unintentional.

## 2020-05-19 DIAGNOSIS — M50322 Other cervical disc degeneration at C5-C6 level: Secondary | ICD-10-CM | POA: Diagnosis not present

## 2020-05-19 DIAGNOSIS — M5459 Other low back pain: Secondary | ICD-10-CM | POA: Diagnosis not present

## 2020-05-19 DIAGNOSIS — M5137 Other intervertebral disc degeneration, lumbosacral region: Secondary | ICD-10-CM | POA: Diagnosis not present

## 2020-05-19 DIAGNOSIS — M5417 Radiculopathy, lumbosacral region: Secondary | ICD-10-CM | POA: Diagnosis not present

## 2020-05-19 DIAGNOSIS — M9902 Segmental and somatic dysfunction of thoracic region: Secondary | ICD-10-CM | POA: Diagnosis not present

## 2020-05-19 DIAGNOSIS — M542 Cervicalgia: Secondary | ICD-10-CM | POA: Diagnosis not present

## 2020-05-19 DIAGNOSIS — M9903 Segmental and somatic dysfunction of lumbar region: Secondary | ICD-10-CM | POA: Diagnosis not present

## 2020-05-19 DIAGNOSIS — M9901 Segmental and somatic dysfunction of cervical region: Secondary | ICD-10-CM | POA: Diagnosis not present

## 2020-05-24 ENCOUNTER — Telehealth: Payer: Self-pay | Admitting: Acute Care

## 2020-05-24 NOTE — Telephone Encounter (Signed)
Called and spoke to pt. Pt states she was advised to call and make an appt and thought it was regarding the email sent to [place]. Advised pt we have not heard back from [place] regarding the email yet, pt verbalized understanding. She states she is just have PND and a tickle in her throat causing her cough. Pt states when she had covid before she was given a albuterol and budesonide neb and this helped her cough, pt questioning if this would help her now. (email has been closed, we can contact pt through this encounter).  Sarah please advise. Thanks.

## 2020-05-24 NOTE — Telephone Encounter (Signed)
Please refer to phone note from 10.6.21.   Will sign off.

## 2020-05-26 DIAGNOSIS — M9903 Segmental and somatic dysfunction of lumbar region: Secondary | ICD-10-CM | POA: Diagnosis not present

## 2020-05-26 DIAGNOSIS — M5417 Radiculopathy, lumbosacral region: Secondary | ICD-10-CM | POA: Diagnosis not present

## 2020-05-26 DIAGNOSIS — M50322 Other cervical disc degeneration at C5-C6 level: Secondary | ICD-10-CM | POA: Diagnosis not present

## 2020-05-26 DIAGNOSIS — M542 Cervicalgia: Secondary | ICD-10-CM | POA: Diagnosis not present

## 2020-05-26 DIAGNOSIS — M9901 Segmental and somatic dysfunction of cervical region: Secondary | ICD-10-CM | POA: Diagnosis not present

## 2020-05-26 DIAGNOSIS — M9902 Segmental and somatic dysfunction of thoracic region: Secondary | ICD-10-CM | POA: Diagnosis not present

## 2020-05-26 DIAGNOSIS — M5137 Other intervertebral disc degeneration, lumbosacral region: Secondary | ICD-10-CM | POA: Diagnosis not present

## 2020-05-26 DIAGNOSIS — M5459 Other low back pain: Secondary | ICD-10-CM | POA: Diagnosis not present

## 2020-05-29 ENCOUNTER — Other Ambulatory Visit: Payer: Self-pay | Admitting: Gastroenterology

## 2020-05-29 ENCOUNTER — Telehealth (INDEPENDENT_AMBULATORY_CARE_PROVIDER_SITE_OTHER): Payer: Medicare Other | Admitting: Internal Medicine

## 2020-05-29 ENCOUNTER — Other Ambulatory Visit: Payer: Self-pay

## 2020-05-29 ENCOUNTER — Encounter: Payer: Self-pay | Admitting: Internal Medicine

## 2020-05-29 DIAGNOSIS — J3489 Other specified disorders of nose and nasal sinuses: Secondary | ICD-10-CM

## 2020-05-29 DIAGNOSIS — J00 Acute nasopharyngitis [common cold]: Secondary | ICD-10-CM

## 2020-05-29 DIAGNOSIS — Z20822 Contact with and (suspected) exposure to covid-19: Secondary | ICD-10-CM | POA: Diagnosis not present

## 2020-05-29 DIAGNOSIS — D860 Sarcoidosis of lung: Secondary | ICD-10-CM | POA: Insufficient documentation

## 2020-05-29 DIAGNOSIS — J302 Other seasonal allergic rhinitis: Secondary | ICD-10-CM

## 2020-05-29 DIAGNOSIS — D869 Sarcoidosis, unspecified: Secondary | ICD-10-CM

## 2020-05-29 DIAGNOSIS — Z03818 Encounter for observation for suspected exposure to other biological agents ruled out: Secondary | ICD-10-CM | POA: Diagnosis not present

## 2020-05-29 MED ORDER — FLUTICASONE PROPIONATE 50 MCG/ACT NA SUSP: NASAL | 1 refills | Status: DC

## 2020-05-29 MED ORDER — DEXLANSOPRAZOLE 60 MG PO CPDR
60.0000 mg | DELAYED_RELEASE_CAPSULE | Freq: Every day | ORAL | 6 refills | Status: DC
Start: 2020-05-29 — End: 2020-07-18

## 2020-05-29 MED ORDER — LINACLOTIDE 72 MCG PO CAPS: 72.0000 ug | ORAL_CAPSULE | Freq: Every day | ORAL | 6 refills | Status: DC

## 2020-05-29 NOTE — Progress Notes (Signed)
Name: Melissa [name]   MRN: [id number]    DOB: [birth year]   Date:05/29/2020       Progress Note  Subjective  Chief Complaint  Chief Complaint  Patient presents with  . Follow-up    I connected with  Melissa [name] on 05/29/20 at 11:00 AM EDT by telephone and verified that I am speaking with the correct person using two identifiers.  I discussed the limitations, risks, security and privacy concerns of performing an evaluation and management service by telephone and the availability of in person appointments. The patient expressed understanding and agreed to proceed. Staff also discussed with the patient that there may be a patient responsible charge related to this service. Patient Location: parking lot Provider Location: [facility] Additional Individuals present: none  HPI Patient is a 69 year old female patient of Dr. Ancil [name] Presents with a phone visit with concerns with some postnasal drip and cough  On 05/22/2020, she sent a message to pulmonology as follows:  Hello. I hope all is well with you and family.  I am experiencing a little cough  and tickle in my throat. It feels like I could benefit from my nebulizer. I am not sure if I should use it or not based on my most recent diagnosis. Would it be ok for me to use it if needed? If so what meds and how often.   Their response on 05/24/2020 was as follows  Yes, It is fine to use those nebs. Please let us know if you are not better so we can get you in to be seen. Thanks so much.  She noted she did not get the response Has appt with Pulm scheduled for 06/20/2020 for follow-up.  The nebulizer is Pulmicort. On last visit with pulmonary 05/10/2020, it was noted they have been following CT scans for findings suspicious with sarcoidosis, with a prior biopsy 25 years ago where she was told she did have sarcoid, but then that diagnosis was questioned by her primary care provider.  She was told she does have sarcoid at that visit. She does  have OSA on CPAP, history of asthma as a child, and struggled with some post Covid dyspnea after having Covid in September 2020.  Covid vaccine - had and also have the booster Has Covid test this evening planned.  + cough, minimal, 10-12 times a day, not marked, no production No marked SOB + PND, mucus dripping down throat, is clear when does see No fever, not feeling feverish + tickle more than sore throat, minimal  No marked congestion or sinus pains No loss of smell, loss of taste No N/V No muscle aches )body aches daily and not new) No marked loose stools/diarrhea No CP,  passing out episodes Have not taken anything  Comorbid conditions reviewed No h/o DM,  + Pulmonary history as above + HTN,  +CKD,  + obesity Wt Readings from Last 3 Encounters:  05/18/20 218 lb 3.2 oz (99 kg)  04/17/20 221 lb 6.4 oz (100.4 kg)  03/27/20 224 lb 2 oz (101.7 kg)   Tobacco-never smoker, has secondhand smoke exposure in her history     Patient Active Problem List   Diagnosis Date Noted  . Aneurysm of celiac artery ([place]) 02/11/2020  . COVID-19 virus infection 09/06/2019  . Localized swelling of right lower leg 09/06/2019  . OSA on CPAP 09/06/2019  . Secondary hyperparathyroidism of renal origin ([place]) 07/21/2019  . Anemia in chronic kidney disease 06/14/2019  . Benign hypertensive kidney  disease with chronic kidney disease 06/14/2019  . Sickle cell trait ([place]) 06/14/2019  . Chronic kidney disease, stage 3b ([place]) 06/14/2019  . Chronic bronchitis ([place]) 05/19/2019  . Chronic venous insufficiency 04/15/2019  . Lymphedema 04/15/2019  . Swelling of limb 03/30/2019  . Family history of colon cancer   . Trigeminal neuralgia 09/11/2018  . Cardiac LV ejection fraction of 40-49% 05/07/2017  . Low back pain without sciatica 02/21/2017  . Renal cyst 10/18/2016  . Atherosclerosis of aorta ([place]) 10/16/2016  . History of colonic polyps 09/25/2016  . Dyslipidemia 12/08/2015  . Gastroesophageal  reflux disease without esophagitis 08/11/2015  . Primary osteoarthritis of knee 06/13/2015  . Hip bursitis 06/13/2015  . RLS (restless legs syndrome) 06/13/2015  . Depression, major, in remission ([place]) 06/13/2015  . B12 deficiency 06/13/2015  . History of Roux-en-Y gastric bypass 06/13/2015  . History of iron deficiency anemia 06/13/2015  . Vitamin D deficiency 06/13/2015  . Hypertension, benign 06/13/2015  . Perennial allergic rhinitis with seasonal variation 06/13/2015  . Obesity (BMI 30.0-34.9) 06/13/2015  . Cervical radiculitis 01/04/2014  . Cervical spinal stenosis 01/04/2014  . Cervical osteoarthritis 01/04/2014    Past Surgical History:  Procedure Laterality Date  . BARIATRIC SURGERY    . CESAREAN SECTION     3 or more  . COLONOSCOPY WITH PROPOFOL N/A 03/15/2019   Procedure: COLONOSCOPY WITH PROPOFOL;  Surgeon: [name], MD;  Location: [facility] ENDOSCOPY;  Service: Gastroenterology;  Laterality: N/A;  . GASTRIC BYPASS    . SHOULDER ARTHROSCOPY Right   . TUBAL LIGATION      Family History  Problem Relation Age of Onset  . Hypercholesterolemia Mother   . Heart disease Mother   . Hypertension Mother   . Alcohol abuse Father   . Lung cancer Brother   . Alcohol abuse Brother   . Diabetes Mellitus II Sister   . Hypertension Maternal Grandmother   . Colon cancer Sister   . Breast cancer Neg Hx   . Prostate cancer Neg Hx   . Bladder Cancer Neg Hx   . Kidney cancer Neg Hx     Social History   Tobacco Use  . Smoking status: Never Smoker  . Smokeless tobacco: Never Used  . Tobacco comment: Second hand smoke exposure  Substance Use Topics  . Alcohol use: Not Currently    Alcohol/week: 0.0 standard drinks     Current Outpatient Medications:  .  albuterol (VENTOLIN HFA) 108 (90 Base) MCG/ACT inhaler, Inhale 2 puffs into the lungs every 6 (six) hours as needed for wheezing or shortness of breath., Disp: 18 g, Rfl: 0 .  ASHWAGANDHA PO, Take by mouth daily. ,  Disp: , Rfl:  .  aspirin 81 MG EC tablet, TAKE 1 TABLET BY MOUTH EVERY DAY, Disp: 30 tablet, Rfl: 0 .  atorvastatin (LIPITOR) 40 MG tablet, Take 1 tablet (40 mg total) by mouth daily., Disp: 90 tablet, Rfl: 1 .  azelastine (ASTELIN) 0.1 % nasal spray, 2 SPRAYS IN EACH NOSTRIL DAILY, Disp: 90 mL, Rfl: 0 .  BLACK CURRANT SEED OIL PO, Take by mouth daily. , Disp: , Rfl:  .  budesonide (PULMICORT) 0.25 MG/2ML nebulizer solution, Take 0.25 mg by nebulization 2 (two) times daily. PRN only, Disp: , Rfl:  .  carbamazepine (TEGRETOL) 200 MG tablet, Take 200 mg by mouth 3 (three) times daily as needed., Disp: , Rfl:  .  Cholecalciferol (VITAMIN D3 ADULT GUMMIES PO), Take 2 each by mouth daily. , Disp: ,  Rfl:  .  Docusate Sodium (DSS) 100 MG CAPS, Take by mouth at bedtime. , Disp: , Rfl:  .  ferrous sulfate (IRON SUPPLEMENT) 325 (65 FE) MG tablet, Take 325 mg by mouth daily with breakfast., Disp: , Rfl:  .  fluticasone (FLONASE) 50 MCG/ACT nasal spray, SPRAY 2 SPRAYS INTO EACH NOSTRIL EVERY DAY, Disp: 48 mL, Rfl: 1 .  gabapentin (NEURONTIN) 100 MG capsule, Take 100 mg by mouth at bedtime as needed., Disp: , Rfl:  .  loratadine (CLARITIN) 10 MG tablet, Take 1 tablet (10 mg total) by mouth daily., Disp: 90 tablet, Rfl: 0 .  losartan (COZAAR) 25 MG tablet, TAKE 1 TABLET BY MOUTH EVERY DAY, Disp: 90 tablet, Rfl: 3 .  magnesium oxide (MAG-OX) 400 MG tablet, Take 400 mg by mouth daily. Reported on 12/08/2015, Disp: , Rfl:  .  Multiple Vitamins-Minerals (CENTRUM SILVER) tablet, Take 1 tablet by mouth daily., Disp: 30 tablet, Rfl: 0 .  omeprazole (PRILOSEC) 20 MG capsule, TAKE 1 CAPSULE BY MOUTH EVERY DAY, Disp: 30 capsule, Rfl: 0 .  TURMERIC PO, Take by mouth daily. , Disp: , Rfl:  .  dexlansoprazole (DEXILANT) 60 MG capsule, Take 1 capsule (60 mg total) by mouth daily., Disp: 30 capsule, Rfl: 6 .  linaclotide (LINZESS) 72 MCG capsule, Take 1 capsule (72 mcg total) by mouth daily before breakfast., Disp: 30 capsule,  Rfl: 6  Allergies  Allergen Reactions  . Contrast Media [Iodinated Diagnostic Agents]   . Shellfish Allergy     Edema    With staff assistance, above reviewed with the patient today.  ROS: As per HPI, otherwise no specific complaints on a limited and focused system review   Objective  Virtual encounter, vitals not obtained.  There is no height or weight on file to calculate BMI.  Physical Exam   Appears in NAD via conversation, no cough during our conversation, pleasant Breathing: No obvious respiratory distress. Speaking in complete sentences Neurological: Pt is alert, Speech is normal Psychiatric: Patient has a normal mood and affect, behavior is normal. Judgment and thought content normal.   No results found for this or any previous visit (from the past 72 hour(s)).  PHQ2/9: Depression screen [facility] 2/9 05/29/2020 03/09/2020 01/26/2020 10/08/2019 10/01/2019  Decreased Interest 0 0 0 0 0  Down, Depressed, Hopeless 0 0 0 0 0  PHQ - 2 Score 0 0 0 0 0  Altered sleeping - 0 0 - 0  Tired, decreased energy - 0 0 - 1  Change in appetite - 0 0 - 0  Feeling bad or failure about yourself  - 0 0 - 0  Trouble concentrating - 0 0 - 0  Moving slowly or fidgety/restless - 0 0 - 0  Suicidal thoughts - 0 0 - 0  PHQ-9 Score - 0 0 - 1  Difficult doing work/chores - Not difficult at all - - Not difficult at all  Some recent data might be hidden   PHQ-2/9 Result reviewed  Fall Risk: Fall Risk  05/29/2020 03/09/2020 01/26/2020 10/08/2019 10/01/2019  Falls in the past year? 0 1 1 1 1   Comment - - - - -  Number falls in past yr: 0 0 0 1 0  Injury with Fall? 0 1 1 1 1   Comment - - - fracture of pinky finger on right hand -  Risk for fall due to : - - - History of fall(s) -  Follow up - - - Falls prevention discussed -  Assessment & Plan  1. Acute rhinitis/mild cough Agree with patient that this does not seem likely to be Covid, which she has had in September 2020, and has had the vaccine  as well as a booster.  Does not seem like a bacterial infectious concern noted, and it does have a mild infectious component, likely more viral based. She notes she was just more concerned with her recent diagnosis with pulmonary, and then she noted concerns that she did not get a follow-up with her question asked, although I did see a follow-up response that she did not see apparently.  I did share that with her today.  They did say it was okay to use that nebulizer as needed. Felt best to add a Flonase product, can use twice daily for the first 3 days, then use once daily She was prescribed a Flonase product in the past for seasonal allergic rhinitis noted. Do not feel she needs to have an inhaler at this point, and has a steroid to use via nebulizer at home if her coughing is a little more problematic.  Emphasized if her symptoms are not improving or more problematic, following up again, and also should follow-up if the Covid test is positive.  (Expect this to be negative as we discussed, butdo feel getting this is helpful)  2. Sarcoidosis Continue with plan follow-up with pulmonary, and if is having more cough, can call and follow-up with them as they noted in their message response to her that if she was not getting better that they did want to see her.   I discussed the assessment and treatment plan with the patient. The patient was provided an opportunity to ask questions and all were answered. The patient agreed with the plan and demonstrated an understanding of the instructions.  Red flags and when to present for emergency care or RTC including fevers, chest pain, shortness of breath, new/worsening/un-resolving symptoms reviewed with patient at time of visit.   The patient was advised to call back or seek an in-person evaluation if the symptoms worsen or if the condition fails to improve as anticipated.  I provided 20 minutes of non-face-to-face time during this encounter that included  discussing at length patient's sx/history, pertinent pmhx, medications, treatment and follow up plan. This time also included the necessary documentation, orders, and chart review.  Towanda Malkin, MD

## 2020-06-02 DIAGNOSIS — M5417 Radiculopathy, lumbosacral region: Secondary | ICD-10-CM | POA: Diagnosis not present

## 2020-06-02 DIAGNOSIS — M5459 Other low back pain: Secondary | ICD-10-CM | POA: Diagnosis not present

## 2020-06-02 DIAGNOSIS — M9903 Segmental and somatic dysfunction of lumbar region: Secondary | ICD-10-CM | POA: Diagnosis not present

## 2020-06-02 DIAGNOSIS — M9901 Segmental and somatic dysfunction of cervical region: Secondary | ICD-10-CM | POA: Diagnosis not present

## 2020-06-02 DIAGNOSIS — M5137 Other intervertebral disc degeneration, lumbosacral region: Secondary | ICD-10-CM | POA: Diagnosis not present

## 2020-06-02 DIAGNOSIS — M50322 Other cervical disc degeneration at C5-C6 level: Secondary | ICD-10-CM | POA: Diagnosis not present

## 2020-06-02 DIAGNOSIS — M9902 Segmental and somatic dysfunction of thoracic region: Secondary | ICD-10-CM | POA: Diagnosis not present

## 2020-06-02 DIAGNOSIS — M542 Cervicalgia: Secondary | ICD-10-CM | POA: Diagnosis not present

## 2020-06-06 ENCOUNTER — Other Ambulatory Visit: Payer: Self-pay

## 2020-06-06 DIAGNOSIS — F411 Generalized anxiety disorder: Secondary | ICD-10-CM | POA: Diagnosis not present

## 2020-06-06 MED ORDER — PANTOPRAZOLE SODIUM 40 MG PO TBEC: 40.0000 mg | DELAYED_RELEASE_TABLET | Freq: Every day | ORAL | 3 refills | Status: DC

## 2020-06-06 NOTE — Telephone Encounter (Signed)
Sarah, please advise. Thanks.

## 2020-06-06 NOTE — Telephone Encounter (Signed)
It is fine to use the nebs while she is having a flare . She can use as she did previously, but if she does not get better, she will need to call for an appointment to be seen. Thanks so much

## 2020-06-06 NOTE — Telephone Encounter (Signed)
Spoke with pt. She is aware of Sarah's response. Nothing further was needed.

## 2020-06-06 NOTE — Telephone Encounter (Signed)
LMTCB x1 for pt.  

## 2020-06-09 DIAGNOSIS — M9903 Segmental and somatic dysfunction of lumbar region: Secondary | ICD-10-CM | POA: Diagnosis not present

## 2020-06-09 DIAGNOSIS — M542 Cervicalgia: Secondary | ICD-10-CM | POA: Diagnosis not present

## 2020-06-09 DIAGNOSIS — M5459 Other low back pain: Secondary | ICD-10-CM | POA: Diagnosis not present

## 2020-06-09 DIAGNOSIS — M9901 Segmental and somatic dysfunction of cervical region: Secondary | ICD-10-CM | POA: Diagnosis not present

## 2020-06-09 DIAGNOSIS — M50322 Other cervical disc degeneration at C5-C6 level: Secondary | ICD-10-CM | POA: Diagnosis not present

## 2020-06-09 DIAGNOSIS — M9902 Segmental and somatic dysfunction of thoracic region: Secondary | ICD-10-CM | POA: Diagnosis not present

## 2020-06-09 DIAGNOSIS — M5417 Radiculopathy, lumbosacral region: Secondary | ICD-10-CM | POA: Diagnosis not present

## 2020-06-09 DIAGNOSIS — M5137 Other intervertebral disc degeneration, lumbosacral region: Secondary | ICD-10-CM | POA: Diagnosis not present

## 2020-06-15 ENCOUNTER — Ambulatory Visit: Payer: Self-pay | Admitting: *Deleted

## 2020-06-15 NOTE — Telephone Encounter (Signed)
Patient with lower left-sided back pain that increases with activity and pain in the groin on the left. This began yesterday comes and goes along with bilateral toe cramping intermittently. Denies CP/SOB/Fever. No difficulty voiding, emptying bladder, denies N/V, tolerating fluids and food.LBM today. Advised increasing water intake to at least 5-6 glasses today and daily. Try a potassium enriched food like bananas/potatoes to help with the cramping. No availability with pcp tomorrow. Advised UC/ED at this time as patient sounded in much discomfort. Unsure if patient will seek immediate evaluation of her symptoms. Reviewed other urgent symptoms if noticed she should seek treatment immediately.  Reason for Disposition . MODERATE pain (e.g., interferes with normal activities or awakens from sleep)  Answer Assessment - Initial Assessment Questions 1. LOCATION: "Where does it hurt?"      Possible the pain is beginning in the back-lower left side 2. RADIATION: "Does the pain shoot anywhere else?" (e.g., chest, back)     Goes to the abdomen-lower 3. ONSET: "When did the pain begin?" (e.g., minutes, hours or days ago)      yesterday 4. SUDDEN: "Gradual or sudden onset?"     gradual 5. PATTERN "Does the pain come and go, or is it constant?"    - If constant: "Is it getting better, staying the same, or worsening?"      (Note: Constant means the pain never goes away completely; most serious pain is constant and it progresses)     - If intermittent: "How long does it last?" "Do you have pain now?"     (Note: Intermittent means the pain goes away completely between bouts)     Comes and goes 6. SEVERITY: "How bad is the pain?"  (e.g., Scale 1-10; mild, moderate, or severe)   - MILD (1-3): doesn't interfere with normal activities, abdomen soft and not tender to touch    - MODERATE (4-7): interferes with normal activities or awakens from sleep, tender to touch    - SEVERE (8-10): excruciating pain, doubled  over, unable to do any normal activities      6 7. RECURRENT SYMPTOM: "Have you ever had this type of stomach pain before?" If Yes, ask: "When was the last time?" and "What happened that time?"      Yes but unsure what is occurring with the stomach. 8. CAUSE: "What do you think is causing the stomach pain?"     unsure 9. RELIEVING/AGGRAVATING FACTORS: "What makes it better or worse?" (e.g., movement, antacids, bowel movement)     Movement worsens the back pain 10. OTHER SYMPTOMS: "Has there been any vomiting, diarrhea, constipation, or urine problems?"       None 11. PREGNANCY: "Is there any chance you are pregnant?" "When was your last menstrual period?"       na  Protocols used: FLANK PAIN-A-AH, ABDOMINAL PAIN - [facility]

## 2020-06-16 DIAGNOSIS — D631 Anemia in chronic kidney disease: Secondary | ICD-10-CM | POA: Diagnosis not present

## 2020-06-16 DIAGNOSIS — N1832 Chronic kidney disease, stage 3b: Secondary | ICD-10-CM | POA: Diagnosis not present

## 2020-06-16 DIAGNOSIS — I129 Hypertensive chronic kidney disease with stage 1 through stage 4 chronic kidney disease, or unspecified chronic kidney disease: Secondary | ICD-10-CM | POA: Diagnosis not present

## 2020-06-16 DIAGNOSIS — D573 Sickle-cell trait: Secondary | ICD-10-CM | POA: Diagnosis not present

## 2020-06-19 DIAGNOSIS — M160 Bilateral primary osteoarthritis of hip: Secondary | ICD-10-CM | POA: Diagnosis not present

## 2020-06-19 DIAGNOSIS — M25552 Pain in left hip: Secondary | ICD-10-CM | POA: Diagnosis not present

## 2020-06-19 DIAGNOSIS — M7061 Trochanteric bursitis, right hip: Secondary | ICD-10-CM | POA: Diagnosis not present

## 2020-06-19 DIAGNOSIS — M25551 Pain in right hip: Secondary | ICD-10-CM | POA: Diagnosis not present

## 2020-06-20 ENCOUNTER — Other Ambulatory Visit: Payer: Self-pay

## 2020-06-20 ENCOUNTER — Encounter: Payer: Self-pay | Admitting: Pulmonary Disease

## 2020-06-20 ENCOUNTER — Ambulatory Visit (INDEPENDENT_AMBULATORY_CARE_PROVIDER_SITE_OTHER): Payer: Medicare Other | Admitting: Pulmonary Disease

## 2020-06-20 VITALS — BP 124/78 | HR 79 | Temp 97.3°F | Ht 71.0 in | Wt 219.0 lb

## 2020-06-20 DIAGNOSIS — M48062 Spinal stenosis, lumbar region with neurogenic claudication: Secondary | ICD-10-CM | POA: Diagnosis not present

## 2020-06-20 DIAGNOSIS — D869 Sarcoidosis, unspecified: Secondary | ICD-10-CM | POA: Diagnosis not present

## 2020-06-20 DIAGNOSIS — J849 Interstitial pulmonary disease, unspecified: Secondary | ICD-10-CM | POA: Diagnosis not present

## 2020-06-20 DIAGNOSIS — M47816 Spondylosis without myelopathy or radiculopathy, lumbar region: Secondary | ICD-10-CM | POA: Diagnosis not present

## 2020-06-20 DIAGNOSIS — M5416 Radiculopathy, lumbar region: Secondary | ICD-10-CM | POA: Diagnosis not present

## 2020-06-20 DIAGNOSIS — M5136 Other intervertebral disc degeneration, lumbar region: Secondary | ICD-10-CM | POA: Diagnosis not present

## 2020-06-20 NOTE — Patient Instructions (Addendum)
Schedule pulmonary function test and high-res CT Follow-up in 6 months after these tests

## 2020-06-20 NOTE — Progress Notes (Signed)
Melissa [name]    [id number]    10/22/1950  Primary Care Physician:Sowles, Drue Stager, MD  Referring Physician: Steele Sizer, MD [address] [place],  [place] [address]  Chief complaint: Consult for interstitial lung disease, sarcoidosis  HPI: 69 year old with history of nodular lung disease. She had a mediastinoscopy in [place] around 2000.  She was told by the surgeon that she had sarcoid but later seen by a pulmonologist who said she did not have sarcoid.  Never got treated Moved to [place] around 2005 and evaluated in pulmonary clinic around 2020 for abnormal CT scan with nodular opacities, mediastinal adenopathy with calcification.  She had Covid around September 2020.  Did not get hospitalized and had a mild infection Has chronic cough, chest congestion.  Denies any dyspnea, rash  Case reviewed in multidisciplinary ILD clinic on 05/02/2020  Pets: Dog Occupation: Retired post Sales promotion account executive Exposures: No known exposures.  No mold, hot tub, Jacuzzi.  No feather pillows or comforters Smoking history: Never smoker Travel history: From [place].  New to [place] in 2005 Relevant family history: Brother had lung cancer.  He was a smoker.  Outpatient Encounter Medications as of 06/20/2020  Medication Sig  . albuterol (VENTOLIN HFA) 108 (90 Base) MCG/ACT inhaler Inhale 2 puffs into the lungs every 6 (six) hours as needed for wheezing or shortness of breath.  . ASHWAGANDHA PO Take by mouth daily.   Marland Kitchen aspirin 81 MG EC tablet TAKE 1 TABLET BY MOUTH EVERY DAY  . atorvastatin (LIPITOR) 40 MG tablet Take 1 tablet (40 mg total) by mouth daily.  Marland Kitchen azelastine (ASTELIN) 0.1 % nasal spray 2 SPRAYS IN EACH NOSTRIL DAILY  . BLACK CURRANT SEED OIL PO Take by mouth daily.   . budesonide (PULMICORT) 0.25 MG/2ML nebulizer solution Take 0.25 mg by nebulization 2 (two) times daily. PRN only  . carbamazepine (TEGRETOL) 200 MG tablet Take 200 mg by mouth 3 (three)  times daily as needed.  . Cholecalciferol (VITAMIN D3 ADULT GUMMIES PO) Take 2 each by mouth daily.   Marland Kitchen dexlansoprazole (DEXILANT) 60 MG capsule Take 1 capsule (60 mg total) by mouth daily.  Mariane Baumgarten Sodium (DSS) 100 MG CAPS Take by mouth at bedtime.   . ferrous sulfate (IRON SUPPLEMENT) 325 (65 FE) MG tablet Take 325 mg by mouth daily with breakfast.  . fluticasone (FLONASE) 50 MCG/ACT nasal spray 2 sprays to each nostril once daily.  Can use twice daily for the first 3 days  . gabapentin (NEURONTIN) 100 MG capsule Take 100 mg by mouth at bedtime as needed.  . linaclotide (LINZESS) 72 MCG capsule Take 1 capsule (72 mcg total) by mouth daily before breakfast.  . loratadine (CLARITIN) 10 MG tablet Take 1 tablet (10 mg total) by mouth daily.  Marland Kitchen losartan (COZAAR) 25 MG tablet TAKE 1 TABLET BY MOUTH EVERY DAY  . magnesium oxide (MAG-OX) 400 MG tablet Take 400 mg by mouth daily. Reported on 12/08/2015  . Multiple Vitamins-Minerals (CENTRUM SILVER) tablet Take 1 tablet by mouth daily.  Marland Kitchen omeprazole (PRILOSEC) 20 MG capsule TAKE 1 CAPSULE BY MOUTH EVERY DAY  . pantoprazole (PROTONIX) 40 MG tablet Take 1 tablet (40 mg total) by mouth daily.  . TURMERIC PO Take by mouth daily.    No facility-administered encounter medications on file as of 06/20/2020.    Allergies as of 06/20/2020 - Review Complete 06/20/2020  Allergen Reaction Noted  . Contrast media [iodinated diagnostic  agents]  04/17/2015  . Shellfish allergy  04/17/2015    Past Medical History:  Diagnosis Date  . Bruising   . Cervical radiculopathy   . Deficiency of vitamin B   . Depression   . Edema   . Flank pain   . Gross hematuria   . HTN (hypertension)   . Iron deficiency anemia   . Leukopenia   . OA (osteoarthritis)   . Obesity   . OSA on CPAP   . Osteopenia   . Perennial allergic rhinitis   . Renal cysts, acquired, bilateral   . RLS (restless legs syndrome)   . Sickle cell trait ([place])   . Snoring   . Spinal stenosis     . Trigeminal neuralgia     Past Surgical History:  Procedure Laterality Date  . BARIATRIC SURGERY    . CESAREAN SECTION     3 or more  . COLONOSCOPY WITH PROPOFOL N/A 03/15/2019   Procedure: COLONOSCOPY WITH PROPOFOL;  Surgeon: [name], MD;  Location: [facility] ENDOSCOPY;  Service: Gastroenterology;  Laterality: N/A;  . GASTRIC BYPASS    . SHOULDER ARTHROSCOPY Right   . TUBAL LIGATION      Family History  Problem Relation Age of Onset  . Hypercholesterolemia Mother   . Heart disease Mother   . Hypertension Mother   . Alcohol abuse Father   . Lung cancer Brother   . Alcohol abuse Brother   . Diabetes Mellitus II Sister   . Hypertension Maternal Grandmother   . Colon cancer Sister   . Breast cancer Neg Hx   . Prostate cancer Neg Hx   . Bladder Cancer Neg Hx   . Kidney cancer Neg Hx     Social History   Socioeconomic History  . Marital status: Widowed    Spouse name: Tenaya Hilyer  . Number of children: 4  . Years of education: Not on file  . Highest education level: Some college, no degree  Occupational History    Comment: retired  Tobacco Use  . Smoking status: Never Smoker  . Smokeless tobacco: Never Used  . Tobacco comment: Second hand smoke exposure  Vaping Use  . Vaping Use: Never used  Substance and Sexual Activity  . Alcohol use: Not Currently    Alcohol/week: 0.0 standard drinks  . Drug use: No  . Sexual activity: Yes    Partners: Male    Birth control/protection: Post-menopausal  Other Topics Concern  . Not on file  Social History Narrative  . Not on file   Social Determinants of Health   Financial Resource Strain: Low Risk   . Difficulty of Paying Living Expenses: Not hard at all  Food Insecurity: No Food Insecurity  . Worried About Charity fundraiser in the Last Year: Never true  . Ran Out of Food in the Last Year: Never true  Transportation Needs: No Transportation Needs  . Lack of Transportation (Medical): No  . Lack of  Transportation (Non-Medical): No  Physical Activity: Inactive  . Days of Exercise per Week: 0 days  . Minutes of Exercise per Session: 0 min  Stress: No Stress Concern Present  . Feeling of Stress : Not at all  Social Connections: Moderately Isolated  . Frequency of Communication with Friends and Family: More than three times a week  . Frequency of Social Gatherings with Friends and Family: More than three times a week  . Attends Religious Services: Never  . Active Member of Clubs or  Organizations: No  . Attends Archivist Meetings: Never  . Marital Status: Married  Human resources officer Violence: Not At Risk  . Fear of Current or Ex-Partner: No  . Emotionally Abused: No  . Physically Abused: No  . Sexually Abused: No    Review of systems: Review of Systems  Constitutional: Negative for fever and chills.  HENT: Negative.   Eyes: Negative for blurred vision.  Respiratory: as per HPI  Cardiovascular: Negative for chest pain and palpitations.  Gastrointestinal: Negative for vomiting, diarrhea, blood per rectum. Genitourinary: Negative for dysuria, urgency, frequency and hematuria.  Musculoskeletal: Negative for myalgias, back pain and joint pain.  Skin: Negative for itching and rash.  Neurological: Negative for dizziness, tremors, focal weakness, seizures and loss of consciousness.  Endo/Heme/Allergies: Negative for environmental allergies.  Psychiatric/Behavioral: Negative for depression, suicidal ideas and hallucinations.  All other systems reviewed and are negative.  Physical Exam: Blood pressure 124/78, pulse 79, temperature (!) 97.3 F (36.3 C), temperature source Skin, height 5\' 11"  (1.803 m), weight 219 lb (99.3 kg), SpO2 98 %. Gen:      No acute distress HEENT:  EOMI, sclera anicteric Neck:     No masses; no thyromegaly Lungs:    Clear to auscultation bilaterally; normal respiratory effort CV:         Regular rate and rhythm; no murmurs Abd:      + bowel sounds;  soft, non-tender; no palpable masses, no distension Ext:    No edema; adequate peripheral perfusion Skin:      Warm and dry; no rash Neuro: alert and oriented x 3 Psych: normal mood and affect  Data Reviewed: Imaging: CT chest 04/12/2020-stable peribronchovascular thickening, bronchiectasis with nodularity most evident in the upper lungs.  Borderline enlarged mediastinal and hilar lymph nodes. Findings are stable compared to 2020 but worsened compared to CT scans in 2013.  PFTs: 04/07/2020 FVC 4.61 [140%), FEV1 3.55 [137%], F/F 77, TLC 7.48 [122%], DLCO 20.30 [82%] Normal test  Labs: CTD serologies 04/17/2020-ANA 1:40, nuclear speckled  Assessment:  Sarcoidosis, interstitial lung disease She has history of sarcoidosis apparently diagnosed by biopsy many years ago, not on treatment CT scan reviewed with peribronchovascular nodularity, apical scarring with lymph nodes that is consistent with sarcoid.  She has mild interstitial changes at the bases which appears nonspecific but will require close monitoring to make sure she is not developing other kinds of ILD's  Does not need treatment at present, PFTs are normal  Follow-up in 6 months with high-res CT and PFTs  Plan/Recommendations: High-res CT and PFTs in 6 months  Marshell Garfinkel MD [facility] 06/20/2020, 10:56 AM  CC: Steele Sizer, MD

## 2020-06-23 ENCOUNTER — Encounter: Payer: Self-pay | Admitting: Family Medicine

## 2020-06-23 ENCOUNTER — Telehealth (INDEPENDENT_AMBULATORY_CARE_PROVIDER_SITE_OTHER): Payer: Medicare Other | Admitting: Family Medicine

## 2020-06-23 VITALS — Ht 71.0 in | Wt 219.0 lb

## 2020-06-23 DIAGNOSIS — M542 Cervicalgia: Secondary | ICD-10-CM | POA: Diagnosis not present

## 2020-06-23 DIAGNOSIS — D71 Functional disorders of polymorphonuclear neutrophils: Secondary | ICD-10-CM | POA: Diagnosis not present

## 2020-06-23 DIAGNOSIS — D869 Sarcoidosis, unspecified: Secondary | ICD-10-CM | POA: Diagnosis not present

## 2020-06-23 DIAGNOSIS — J449 Chronic obstructive pulmonary disease, unspecified: Secondary | ICD-10-CM | POA: Diagnosis not present

## 2020-06-23 DIAGNOSIS — M5417 Radiculopathy, lumbosacral region: Secondary | ICD-10-CM | POA: Diagnosis not present

## 2020-06-23 DIAGNOSIS — J8489 Other specified interstitial pulmonary diseases: Secondary | ICD-10-CM | POA: Diagnosis not present

## 2020-06-23 DIAGNOSIS — M5137 Other intervertebral disc degeneration, lumbosacral region: Secondary | ICD-10-CM | POA: Diagnosis not present

## 2020-06-23 DIAGNOSIS — M9903 Segmental and somatic dysfunction of lumbar region: Secondary | ICD-10-CM | POA: Diagnosis not present

## 2020-06-23 DIAGNOSIS — M9902 Segmental and somatic dysfunction of thoracic region: Secondary | ICD-10-CM | POA: Diagnosis not present

## 2020-06-23 DIAGNOSIS — M50322 Other cervical disc degeneration at C5-C6 level: Secondary | ICD-10-CM | POA: Diagnosis not present

## 2020-06-23 DIAGNOSIS — M5459 Other low back pain: Secondary | ICD-10-CM | POA: Diagnosis not present

## 2020-06-23 DIAGNOSIS — M9901 Segmental and somatic dysfunction of cervical region: Secondary | ICD-10-CM | POA: Diagnosis not present

## 2020-06-23 MED ORDER — IPRATROPIUM-ALBUTEROL 0.5-2.5 (3) MG/3ML IN SOLN: 3.0000 mL | Freq: Four times a day (QID) | RESPIRATORY_TRACT | 0 refills | Status: DC | PRN

## 2020-06-23 MED ORDER — MONTELUKAST SODIUM 10 MG PO TABS: 10.0000 mg | ORAL_TABLET | Freq: Every day | ORAL | 1 refills | Status: DC

## 2020-06-23 MED ORDER — BENZONATATE 100 MG PO CAPS: 100.0000 mg | ORAL_CAPSULE | Freq: Three times a day (TID) | ORAL | 1 refills | Status: DC | PRN

## 2020-06-23 MED ORDER — BUDESONIDE 0.5 MG/2ML IN SUSP: 0.5000 mg | Freq: Two times a day (BID) | RESPIRATORY_TRACT | 1 refills | Status: DC

## 2020-06-23 NOTE — Progress Notes (Signed)
Name: Melissa [name]   MRN: [id number]    DOB: 1951/02/28   Date:06/23/2020       Progress Note  Subjective  Chief Complaint  Chief Complaint  Patient presents with  . Follow-up  . Cough    x3 weeks    I connected with  [name]  on 06/23/20 at  7:40 AM EDT by a video enabled telemedicine application and verified that I am speaking with the correct person using two identifiers.  I discussed the limitations of evaluation and management by telemedicine and the availability of in person appointments. The patient expressed understanding and agreed to proceed with the virtual visit  Staff also discussed with the patient that there may be a patient responsible charge related to this service. Patient Location: at home  Provider Location: [facility]   HPI  Sarcoidosis/ILD/Chronic asthma: she has been using nebulizer machine with albuterol and budesonide 0.25/2 ml daily. She has a dry cough that has been bothersome for over 3 weeks, she is currently seeing pulmonologist at [facility] but is worried about having to go for repeat CT's every 6 months, she is concerned about progression of disease and would like more information about ILD. We discussed sending her for a second opinion and she would like to go to [facility]. She denies wheezing at this time. She has post-nasal drainage that causes her to have a dry cough. She is using nasal spray, we will add atrovent and also singulair and also tessalon perles to improve symptoms.    Patient Active Problem List   Diagnosis Date Noted  . Sarcoidosis 05/29/2020  . Aneurysm of celiac artery ([place]) 02/11/2020  . COVID-19 virus infection 09/06/2019  . Localized swelling of right lower leg 09/06/2019  . OSA on CPAP 09/06/2019  . Secondary hyperparathyroidism of renal origin ([place]) 07/21/2019  . Anemia in chronic kidney disease 06/14/2019  . Benign hypertensive kidney disease with chronic kidney disease 06/14/2019  . Sickle cell trait ([place]) 06/14/2019  .  Chronic kidney disease, stage 3b ([place]) 06/14/2019  . Chronic bronchitis ([place]) 05/19/2019  . Chronic venous insufficiency 04/15/2019  . Lymphedema 04/15/2019  . Swelling of limb 03/30/2019  . Family history of colon cancer   . Trigeminal neuralgia 09/11/2018  . Cardiac LV ejection fraction of 40-49% 05/07/2017  . Low back pain without sciatica 02/21/2017  . Renal cyst 10/18/2016  . Atherosclerosis of aorta ([place]) 10/16/2016  . History of colonic polyps 09/25/2016  . Dyslipidemia 12/08/2015  . Gastroesophageal reflux disease without esophagitis 08/11/2015  . Primary osteoarthritis of knee 06/13/2015  . Hip bursitis 06/13/2015  . RLS (restless legs syndrome) 06/13/2015  . Depression, major, in remission ([place]) 06/13/2015  . B12 deficiency 06/13/2015  . History of Roux-en-Y gastric bypass 06/13/2015  . History of iron deficiency anemia 06/13/2015  . Vitamin D deficiency 06/13/2015  . Hypertension, benign 06/13/2015  . Perennial allergic rhinitis with seasonal variation 06/13/2015  . Obesity (BMI 30.0-34.9) 06/13/2015  . Cervical radiculitis 01/04/2014  . Cervical spinal stenosis 01/04/2014  . Cervical osteoarthritis 01/04/2014    Past Surgical History:  Procedure Laterality Date  . BARIATRIC SURGERY    . CESAREAN SECTION     3 or more  . COLONOSCOPY WITH PROPOFOL N/A 03/15/2019   Procedure: COLONOSCOPY WITH PROPOFOL;  Surgeon: [name], MD;  Location: [facility] ENDOSCOPY;  Service: Gastroenterology;  Laterality: N/A;  . GASTRIC BYPASS    . SHOULDER ARTHROSCOPY Right   . TUBAL LIGATION  Family History  Problem Relation Age of Onset  . Hypercholesterolemia Mother   . Heart disease Mother   . Hypertension Mother   . Alcohol abuse Father   . Lung cancer Brother   . Alcohol abuse Brother   . Diabetes Mellitus II Sister   . Hypertension Maternal Grandmother   . Colon cancer Sister   . Breast cancer Neg Hx   . Prostate cancer Neg Hx   . Bladder Cancer Neg Hx   .  Kidney cancer Neg Hx     Social History   Socioeconomic History  . Marital status: Widowed    Spouse name: Lacy Sofia  . Number of children: 4  . Years of education: Not on file  . Highest education level: Some college, no degree  Occupational History    Comment: retired  Tobacco Use  . Smoking status: Never Smoker  . Smokeless tobacco: Never Used  . Tobacco comment: Second hand smoke exposure  Vaping Use  . Vaping Use: Never used  Substance and Sexual Activity  . Alcohol use: Not Currently    Alcohol/week: 0.0 standard drinks  . Drug use: No  . Sexual activity: Yes    Partners: Male    Birth control/protection: Post-menopausal  Other Topics Concern  . Not on file  Social History Narrative  . Not on file   Social Determinants of Health   Financial Resource Strain: Low Risk   . Difficulty of Paying Living Expenses: Not hard at all  Food Insecurity: No Food Insecurity  . Worried About Charity fundraiser in the Last Year: Never true  . Ran Out of Food in the Last Year: Never true  Transportation Needs: No Transportation Needs  . Lack of Transportation (Medical): No  . Lack of Transportation (Non-Medical): No  Physical Activity: Inactive  . Days of Exercise per Week: 0 days  . Minutes of Exercise per Session: 0 min  Stress: No Stress Concern Present  . Feeling of Stress : Not at all  Social Connections: Moderately Isolated  . Frequency of Communication with Friends and Family: More than three times a week  . Frequency of Social Gatherings with Friends and Family: More than three times a week  . Attends Religious Services: Never  . Active Member of Clubs or Organizations: No  . Attends Archivist Meetings: Never  . Marital Status: Married  Human resources officer Violence: Not At Risk  . Fear of Current or Ex-Partner: No  . Emotionally Abused: No  . Physically Abused: No  . Sexually Abused: No     Current Outpatient Medications:  .  albuterol (VENTOLIN  HFA) 108 (90 Base) MCG/ACT inhaler, Inhale 2 puffs into the lungs every 6 (six) hours as needed for wheezing or shortness of breath., Disp: 18 g, Rfl: 0 .  ASHWAGANDHA PO, Take by mouth daily. , Disp: , Rfl:  .  aspirin 81 MG EC tablet, TAKE 1 TABLET BY MOUTH EVERY DAY, Disp: 30 tablet, Rfl: 0 .  atorvastatin (LIPITOR) 40 MG tablet, Take 1 tablet (40 mg total) by mouth daily., Disp: 90 tablet, Rfl: 1 .  azelastine (ASTELIN) 0.1 % nasal spray, 2 SPRAYS IN EACH NOSTRIL DAILY, Disp: 90 mL, Rfl: 0 .  BLACK CURRANT SEED OIL PO, Take by mouth daily. , Disp: , Rfl:  .  budesonide (PULMICORT) 0.25 MG/2ML nebulizer solution, Take 0.25 mg by nebulization 2 (two) times daily. PRN only, Disp: , Rfl:  .  carbamazepine (TEGRETOL) 200 MG tablet, Take  200 mg by mouth 3 (three) times daily as needed., Disp: , Rfl:  .  Cholecalciferol (VITAMIN D3 ADULT GUMMIES PO), Take 2 each by mouth daily. , Disp: , Rfl:  .  dexlansoprazole (DEXILANT) 60 MG capsule, Take 1 capsule (60 mg total) by mouth daily., Disp: 30 capsule, Rfl: 6 .  Docusate Sodium (DSS) 100 MG CAPS, Take by mouth at bedtime. , Disp: , Rfl:  .  ferrous sulfate (IRON SUPPLEMENT) 325 (65 FE) MG tablet, Take 325 mg by mouth daily with breakfast., Disp: , Rfl:  .  fluticasone (FLONASE) 50 MCG/ACT nasal spray, 2 sprays to each nostril once daily.  Can use twice daily for the first 3 days, Disp: 16 g, Rfl: 1 .  gabapentin (NEURONTIN) 100 MG capsule, Take 100 mg by mouth at bedtime as needed., Disp: , Rfl:  .  linaclotide (LINZESS) 72 MCG capsule, Take 1 capsule (72 mcg total) by mouth daily before breakfast., Disp: 30 capsule, Rfl: 6 .  loratadine (CLARITIN) 10 MG tablet, Take 1 tablet (10 mg total) by mouth daily., Disp: 90 tablet, Rfl: 0 .  losartan (COZAAR) 25 MG tablet, TAKE 1 TABLET BY MOUTH EVERY DAY, Disp: 90 tablet, Rfl: 3 .  magnesium oxide (MAG-OX) 400 MG tablet, Take 400 mg by mouth daily. Reported on 12/08/2015, Disp: , Rfl:  .  Multiple  Vitamins-Minerals (CENTRUM SILVER) tablet, Take 1 tablet by mouth daily., Disp: 30 tablet, Rfl: 0 .  omeprazole (PRILOSEC) 20 MG capsule, TAKE 1 CAPSULE BY MOUTH EVERY DAY, Disp: 30 capsule, Rfl: 0 .  pantoprazole (PROTONIX) 40 MG tablet, Take 1 tablet (40 mg total) by mouth daily., Disp: 30 tablet, Rfl: 3 .  TURMERIC PO, Take by mouth daily. , Disp: , Rfl:   Allergies  Allergen Reactions  . Contrast Media [Iodinated Diagnostic Agents]   . Shellfish Allergy     Edema    I personally reviewed active problem list, medication list, allergies, family history, social history, health maintenance, notes from last encounter with the patient/caregiver today.   ROS  Ten systems reviewed and is negative except as mentioned in HPI   Objective  Virtual encounter, vitals not obtained.  Body mass index is 30.54 kg/m.  Physical Exam  Awake, alert and oriented, dry cough   PHQ2/9: Depression screen [facility] 2/9 06/23/2020 05/29/2020 03/09/2020 01/26/2020 10/08/2019  Decreased Interest 0 0 0 0 0  Down, Depressed, Hopeless 0 0 0 0 0  PHQ - 2 Score 0 0 0 0 0  Altered sleeping - - 0 0 -  Tired, decreased energy - - 0 0 -  Change in appetite - - 0 0 -  Feeling bad or failure about yourself  - - 0 0 -  Trouble concentrating - - 0 0 -  Moving slowly or fidgety/restless - - 0 0 -  Suicidal thoughts - - 0 0 -  PHQ-9 Score - - 0 0 -  Difficult doing work/chores - - Not difficult at all - -  Some recent data might be hidden   PHQ-2/9 Result is negative.    Fall Risk: Fall Risk  06/23/2020 05/29/2020 03/09/2020 01/26/2020 10/08/2019  Falls in the past year? 0 0 1 1 1   Comment - - - - -  Number falls in past yr: 0 0 0 0 1  Injury with Fall? 0 0 1 1 1   Comment - - - - fracture of pinky finger on right hand  Risk for fall due to : - - - -  History of fall(s)  Follow up - - - - Falls prevention discussed     Assessment & Plan  1. Sarcoidosis  - Ambulatory referral to Pulmonology  2. Interstitial  lung disease due to granulomatous disease ([place])  - Ambulatory referral to Pulmonology  3. Chronic asthmatic bronchitis ([facility])  - ipratropium-albuterol (DUONEB) 0.5-2.5 (3) MG/3ML SOLN; Take 3 mLs by nebulization every 6 (six) hours as needed.  Dispense: 360 mL; Refill: 0 - budesonide (PULMICORT) 0.5 MG/2ML nebulizer solution; Take 2 mLs (0.5 mg total) by nebulization in the morning and at bedtime.  Dispense: 120 mL; Refill: 1 - montelukast (SINGULAIR) 10 MG tablet; Take 1 tablet (10 mg total) by mouth at bedtime.  Dispense: 90 tablet; Refill: 1 - benzonatate (TESSALON) 100 MG capsule; Take 1-2 capsules (100-200 mg total) by mouth 3 (three) times daily as needed.  Dispense: 100 capsule; Refill: 1 - Ambulatory referral to Pulmonology  I discussed the assessment and treatment plan with the patient. The patient was provided an opportunity to ask questions and all were answered. The patient agreed with the plan and demonstrated an understanding of the instructions.  The patient was advised to call back or seek an in-person evaluation if the symptoms worsen or if the condition fails to improve as anticipated.  I provided 25  minutes of non-face-to-face time during this encounter.

## 2020-06-26 DIAGNOSIS — M9902 Segmental and somatic dysfunction of thoracic region: Secondary | ICD-10-CM | POA: Diagnosis not present

## 2020-06-26 DIAGNOSIS — M542 Cervicalgia: Secondary | ICD-10-CM | POA: Diagnosis not present

## 2020-06-26 DIAGNOSIS — M5417 Radiculopathy, lumbosacral region: Secondary | ICD-10-CM | POA: Diagnosis not present

## 2020-06-26 DIAGNOSIS — M5459 Other low back pain: Secondary | ICD-10-CM | POA: Diagnosis not present

## 2020-06-26 DIAGNOSIS — M5137 Other intervertebral disc degeneration, lumbosacral region: Secondary | ICD-10-CM | POA: Diagnosis not present

## 2020-06-26 DIAGNOSIS — M9901 Segmental and somatic dysfunction of cervical region: Secondary | ICD-10-CM | POA: Diagnosis not present

## 2020-06-26 DIAGNOSIS — M50322 Other cervical disc degeneration at C5-C6 level: Secondary | ICD-10-CM | POA: Diagnosis not present

## 2020-06-26 DIAGNOSIS — M9903 Segmental and somatic dysfunction of lumbar region: Secondary | ICD-10-CM | POA: Diagnosis not present

## 2020-07-04 DIAGNOSIS — M5459 Other low back pain: Secondary | ICD-10-CM | POA: Diagnosis not present

## 2020-07-04 DIAGNOSIS — M9902 Segmental and somatic dysfunction of thoracic region: Secondary | ICD-10-CM | POA: Diagnosis not present

## 2020-07-04 DIAGNOSIS — M50322 Other cervical disc degeneration at C5-C6 level: Secondary | ICD-10-CM | POA: Diagnosis not present

## 2020-07-04 DIAGNOSIS — M9903 Segmental and somatic dysfunction of lumbar region: Secondary | ICD-10-CM | POA: Diagnosis not present

## 2020-07-04 DIAGNOSIS — M5137 Other intervertebral disc degeneration, lumbosacral region: Secondary | ICD-10-CM | POA: Diagnosis not present

## 2020-07-04 DIAGNOSIS — M5417 Radiculopathy, lumbosacral region: Secondary | ICD-10-CM | POA: Diagnosis not present

## 2020-07-04 DIAGNOSIS — F411 Generalized anxiety disorder: Secondary | ICD-10-CM | POA: Diagnosis not present

## 2020-07-04 DIAGNOSIS — M542 Cervicalgia: Secondary | ICD-10-CM | POA: Diagnosis not present

## 2020-07-04 DIAGNOSIS — M9901 Segmental and somatic dysfunction of cervical region: Secondary | ICD-10-CM | POA: Diagnosis not present

## 2020-07-06 DIAGNOSIS — I129 Hypertensive chronic kidney disease with stage 1 through stage 4 chronic kidney disease, or unspecified chronic kidney disease: Secondary | ICD-10-CM | POA: Diagnosis not present

## 2020-07-06 DIAGNOSIS — D573 Sickle-cell trait: Secondary | ICD-10-CM | POA: Diagnosis not present

## 2020-07-06 DIAGNOSIS — D631 Anemia in chronic kidney disease: Secondary | ICD-10-CM | POA: Diagnosis not present

## 2020-07-06 DIAGNOSIS — N1832 Chronic kidney disease, stage 3b: Secondary | ICD-10-CM | POA: Diagnosis not present

## 2020-07-10 DIAGNOSIS — M9903 Segmental and somatic dysfunction of lumbar region: Secondary | ICD-10-CM | POA: Diagnosis not present

## 2020-07-10 DIAGNOSIS — M9901 Segmental and somatic dysfunction of cervical region: Secondary | ICD-10-CM | POA: Diagnosis not present

## 2020-07-10 DIAGNOSIS — M5417 Radiculopathy, lumbosacral region: Secondary | ICD-10-CM | POA: Diagnosis not present

## 2020-07-10 DIAGNOSIS — M5137 Other intervertebral disc degeneration, lumbosacral region: Secondary | ICD-10-CM | POA: Diagnosis not present

## 2020-07-10 DIAGNOSIS — M542 Cervicalgia: Secondary | ICD-10-CM | POA: Diagnosis not present

## 2020-07-10 DIAGNOSIS — M9902 Segmental and somatic dysfunction of thoracic region: Secondary | ICD-10-CM | POA: Diagnosis not present

## 2020-07-10 DIAGNOSIS — M50322 Other cervical disc degeneration at C5-C6 level: Secondary | ICD-10-CM | POA: Diagnosis not present

## 2020-07-10 DIAGNOSIS — M5459 Other low back pain: Secondary | ICD-10-CM | POA: Diagnosis not present

## 2020-07-17 ENCOUNTER — Encounter: Payer: Self-pay | Admitting: Family Medicine

## 2020-07-18 ENCOUNTER — Encounter: Payer: Self-pay | Admitting: Family Medicine

## 2020-07-18 ENCOUNTER — Other Ambulatory Visit: Payer: Self-pay

## 2020-07-18 ENCOUNTER — Ambulatory Visit (INDEPENDENT_AMBULATORY_CARE_PROVIDER_SITE_OTHER): Payer: Medicare Other | Admitting: Family Medicine

## 2020-07-18 VITALS — BP 132/74 | HR 89 | Temp 97.5°F | Resp 16 | Ht 71.0 in | Wt 213.5 lb

## 2020-07-18 DIAGNOSIS — R053 Chronic cough: Secondary | ICD-10-CM | POA: Diagnosis not present

## 2020-07-18 DIAGNOSIS — R079 Chest pain, unspecified: Secondary | ICD-10-CM

## 2020-07-18 DIAGNOSIS — F411 Generalized anxiety disorder: Secondary | ICD-10-CM | POA: Diagnosis not present

## 2020-07-18 NOTE — Progress Notes (Signed)
Name: Melissa [name]   MRN: [id number]    DOB: 1951-02-26   Date:07/18/2020       Progress Note  Subjective  Chief Complaint  Tightness in chest Post nasal drip  HPI  Cough: she states symptoms started about one month ago, she takes tessalon perles , she states she has constant post-nasal drainage, she also has intermittent chest tightness but no wheezing that improves with nebulizer machine. Cough is triggered by strong scents ( that is new since COVID-19), diagnosed with sarcoidosis over 20 years ago but at the time she had two doctors and one stated she did not have sarcoidosis. She has been diagnosed again back in 09/2019 because of persistent cough after COVID-19 She will be seeing sarcoidosis sub-specialist at [facility] in Feb. She is now mostly concerned about post-nasal drainage and some right side chest pain and also sometimes pain on right scapular area. No nausea, vomiting or diaphoresis. No fever or chills. She denies decreased in exercises tolerance. No diaphoresis or nausea  She states she is getting gradually better   Patient Active Problem List   Diagnosis Date Noted  . Sarcoidosis 05/29/2020  . Aneurysm of celiac artery ([place]) 02/11/2020  . COVID-19 virus infection 09/06/2019  . Localized swelling of right lower leg 09/06/2019  . OSA on CPAP 09/06/2019  . Secondary hyperparathyroidism of renal origin ([place]) 07/21/2019  . Anemia in chronic kidney disease 06/14/2019  . Benign hypertensive kidney disease with chronic kidney disease 06/14/2019  . Sickle cell trait ([place]) 06/14/2019  . Chronic kidney disease, stage 3b ([place]) 06/14/2019  . Chronic bronchitis ([place]) 05/19/2019  . Chronic venous insufficiency 04/15/2019  . Lymphedema 04/15/2019  . Swelling of limb 03/30/2019  . Family history of colon cancer   . Trigeminal neuralgia 09/11/2018  . Cardiac LV ejection fraction of 40-49% 05/07/2017  . Low back pain without sciatica 02/21/2017  . Renal cyst 10/18/2016  .  Atherosclerosis of aorta ([place]) 10/16/2016  . History of colonic polyps 09/25/2016  . Dyslipidemia 12/08/2015  . Gastroesophageal reflux disease without esophagitis 08/11/2015  . Primary osteoarthritis of knee 06/13/2015  . Hip bursitis 06/13/2015  . RLS (restless legs syndrome) 06/13/2015  . Depression, major, in remission ([place]) 06/13/2015  . B12 deficiency 06/13/2015  . History of Roux-en-Y gastric bypass 06/13/2015  . History of iron deficiency anemia 06/13/2015  . Vitamin D deficiency 06/13/2015  . Hypertension, benign 06/13/2015  . Perennial allergic rhinitis with seasonal variation 06/13/2015  . Obesity (BMI 30.0-34.9) 06/13/2015  . Cervical radiculitis 01/04/2014  . Cervical spinal stenosis 01/04/2014  . Cervical osteoarthritis 01/04/2014    Past Surgical History:  Procedure Laterality Date  . BARIATRIC SURGERY    . CESAREAN SECTION     3 or more  . COLONOSCOPY WITH PROPOFOL N/A 03/15/2019   Procedure: COLONOSCOPY WITH PROPOFOL;  Surgeon: [name], MD;  Location: [facility] ENDOSCOPY;  Service: Gastroenterology;  Laterality: N/A;  . GASTRIC BYPASS    . SHOULDER ARTHROSCOPY Right   . TUBAL LIGATION      Family History  Problem Relation Age of Onset  . Hypercholesterolemia Mother   . Heart disease Mother   . Hypertension Mother   . Alcohol abuse Father   . Lung cancer Brother   . Alcohol abuse Brother   . Diabetes Mellitus II Sister   . Hypertension Maternal Grandmother   . Colon cancer Sister   . Breast cancer Neg Hx   . Prostate cancer Neg Hx   . Bladder Cancer  Neg Hx   . Kidney cancer Neg Hx     Social History   Tobacco Use  . Smoking status: Never Smoker  . Smokeless tobacco: Never Used  . Tobacco comment: Second hand smoke exposure  Substance Use Topics  . Alcohol use: Not Currently    Alcohol/week: 0.0 standard drinks     Current Outpatient Medications:  .  albuterol (VENTOLIN HFA) 108 (90 Base) MCG/ACT inhaler, Inhale 2 puffs into the lungs  every 6 (six) hours as needed for wheezing or shortness of breath., Disp: 18 g, Rfl: 0 .  ASHWAGANDHA PO, Take by mouth daily. , Disp: , Rfl:  .  aspirin 81 MG EC tablet, TAKE 1 TABLET BY MOUTH EVERY DAY, Disp: 30 tablet, Rfl: 0 .  atorvastatin (LIPITOR) 40 MG tablet, Take 1 tablet (40 mg total) by mouth daily., Disp: 90 tablet, Rfl: 1 .  azelastine (ASTELIN) 0.1 % nasal spray, 2 SPRAYS IN EACH NOSTRIL DAILY, Disp: 90 mL, Rfl: 0 .  benzonatate (TESSALON) 100 MG capsule, Take 1-2 capsules (100-200 mg total) by mouth 3 (three) times daily as needed., Disp: 100 capsule, Rfl: 1 .  BLACK CURRANT SEED OIL PO, Take by mouth daily. , Disp: , Rfl:  .  budesonide (PULMICORT) 0.5 MG/2ML nebulizer solution, Take 2 mLs (0.5 mg total) by nebulization in the morning and at bedtime., Disp: 120 mL, Rfl: 1 .  carbamazepine (TEGRETOL) 200 MG tablet, Take 200 mg by mouth 3 (three) times daily as needed., Disp: , Rfl:  .  chlorhexidine (PERIDEX) 0.12 % solution, SMARTSIG:By Mouth, Disp: , Rfl:  .  Cholecalciferol (VITAMIN D3 ADULT GUMMIES PO), Take 2 each by mouth daily. , Disp: , Rfl:  .  ferrous sulfate (IRON SUPPLEMENT) 325 (65 FE) MG tablet, Take 325 mg by mouth daily with breakfast., Disp: , Rfl:  .  fluticasone (FLONASE) 50 MCG/ACT nasal spray, 2 sprays to each nostril once daily.  Can use twice daily for the first 3 days, Disp: 16 g, Rfl: 1 .  gabapentin (NEURONTIN) 100 MG capsule, Take 100 mg by mouth at bedtime as needed., Disp: , Rfl:  .  ipratropium-albuterol (DUONEB) 0.5-2.5 (3) MG/3ML SOLN, Take 3 mLs by nebulization every 6 (six) hours as needed., Disp: 360 mL, Rfl: 0 .  linaclotide (LINZESS) 72 MCG capsule, Take 1 capsule (72 mcg total) by mouth daily before breakfast., Disp: 30 capsule, Rfl: 6 .  loratadine (CLARITIN) 10 MG tablet, Take 1 tablet (10 mg total) by mouth daily., Disp: 90 tablet, Rfl: 0 .  losartan (COZAAR) 25 MG tablet, TAKE 1 TABLET BY MOUTH EVERY DAY, Disp: 90 tablet, Rfl: 3 .  magnesium  oxide (MAG-OX) 400 MG tablet, Take 400 mg by mouth daily. Reported on 12/08/2015, Disp: , Rfl:  .  montelukast (SINGULAIR) 10 MG tablet, Take 1 tablet (10 mg total) by mouth at bedtime., Disp: 90 tablet, Rfl: 1 .  Multiple Vitamins-Minerals (CENTRUM SILVER) tablet, Take 1 tablet by mouth daily., Disp: 30 tablet, Rfl: 0 .  pantoprazole (PROTONIX) 40 MG tablet, Take 1 tablet (40 mg total) by mouth daily., Disp: 30 tablet, Rfl: 3 .  traMADol (ULTRAM) 50 MG tablet, Take by mouth., Disp: , Rfl:  .  TURMERIC PO, Take by mouth daily. , Disp: , Rfl:   Allergies  Allergen Reactions  . Contrast Media [Iodinated Diagnostic Agents]   . Shellfish Allergy     Edema    I personally reviewed active problem list, medication list, allergies, family history, social  history, health maintenance with the patient/caregiver today.   ROS  Constitutional: Negative for fever or weight change.  Respiratory: Negative for cough and shortness of breath.   Cardiovascular: Negative for chest pain or palpitations.  Gastrointestinal: Negative for abdominal pain, no bowel changes.  Musculoskeletal: Negative for gait problem or joint swelling.  Skin: Negative for rash.  Neurological: Negative for dizziness or headache.  No other specific complaints in a complete review of systems (except as listed in HPI above).  Objective  Vitals:   07/18/20 1442  BP: 132/74  Pulse: 89  Resp: 16  Temp: (!) 97.5 F (36.4 C)  TempSrc: Oral  SpO2: 100%  Weight: 213 lb 8 oz (96.8 kg)  Height: 5\' 11"  (1.803 m)    Body mass index is 29.78 kg/m.  Physical Exam  Constitutional: Patient appears well-developed and well-nourished. Overweight.  No distress.  HEENT: head atraumatic, normocephalic, pupils equal and reactive to light, ears normal TM,  neck supple, throat within normal limits, no tenderness during percussion of sinus Cardiovascular: Normal rate, regular rhythm and normal heart sounds.  No murmur heard. No BLE  edema. Pulmonary/Chest: Effort normal and breath sounds normal. No respiratory distress. Abdominal: Soft.  There is no tenderness. Psychiatric: Patient has a normal mood and affect. behavior is normal. Judgment and thought content normal.   Recent Results (from the past 2160 hour(s))  CBC w/Diff     Status: Abnormal   Collection Time: 05/11/20  8:36 AM  Result Value Ref Range   WBC 5.7 4.0 - 10.5 K/uL   RBC 3.94 3.87 - 5.11 MIL/uL   Hemoglobin 11.8 (L) 12.0 - 15.0 g/dL   HCT 35.8 (L) 36 - 46 %   MCV 90.9 80.0 - 100.0 fL   MCH 29.9 26.0 - 34.0 pg   MCHC 33.0 30.0 - 36.0 g/dL   RDW 12.4 11.5 - 15.5 %   Platelets 221 150 - 400 K/uL   nRBC 0.0 0.0 - 0.2 %   Neutrophils Relative % 39 %   Neutro Abs 2.2 1.7 - 7.7 K/uL   Lymphocytes Relative 45 %   Lymphs Abs 2.6 0.7 - 4.0 K/uL   Monocytes Relative 12 %   Monocytes Absolute 0.7 0.1 - 1.0 K/uL   Eosinophils Relative 3 %   Eosinophils Absolute 0.2 0.0 - 0.5 K/uL   Basophils Relative 1 %   Basophils Absolute 0.0 0.0 - 0.1 K/uL   Immature Granulocytes 0 %   Abs Immature Granulocytes 0.02 0.00 - 0.07 K/uL    Comment: Performed at [facility], [address] [place], [place] [address]     PHQ2/9: Depression screen [facility] 2/9 07/18/2020 06/23/2020 05/29/2020 03/09/2020 01/26/2020  Decreased Interest 0 0 0 0 0  Down, Depressed, Hopeless 0 0 0 0 0  PHQ - 2 Score 0 0 0 0 0  Altered sleeping - - - 0 0  Tired, decreased energy - - - 0 0  Change in appetite - - - 0 0  Feeling bad or failure about yourself  - - - 0 0  Trouble concentrating - - - 0 0  Moving slowly or fidgety/restless - - - 0 0  Suicidal thoughts - - - 0 0  PHQ-9 Score - - - 0 0  Difficult doing work/chores - - - Not difficult at all -  Some recent data might be hidden    phq 9 is negative   Fall Risk: Fall Risk  07/18/2020 06/23/2020 05/29/2020 03/09/2020 01/26/2020  Falls in the past year? 1 0 0 1 1  Comment - - - - -  Number falls in past yr: 0 0 0 0 0   Injury with Fall? 0 0 0 1 1  Comment - - - - -  Risk for fall due to : - - - - -  Follow up - - - - -     Functional Status Survey: Is the patient deaf or have difficulty hearing?: No Does the patient have difficulty seeing, even when wearing glasses/contacts?: No Does the patient have difficulty concentrating, remembering, or making decisions?: No Does the patient have difficulty walking or climbing stairs?: No Does the patient have difficulty dressing or bathing?: No Does the patient have difficulty doing errands alone such as visiting a doctor's office or shopping?: No    Assessment & Plan  1. Chronic cough  Continue medication, getting better, try little noses, continue medications   2. Right-sided chest pain  Reassurance for now

## 2020-07-19 ENCOUNTER — Telehealth: Payer: Self-pay | Admitting: Family Medicine

## 2020-07-19 DIAGNOSIS — K219 Gastro-esophageal reflux disease without esophagitis: Secondary | ICD-10-CM | POA: Diagnosis not present

## 2020-07-19 DIAGNOSIS — D573 Sickle-cell trait: Secondary | ICD-10-CM | POA: Diagnosis not present

## 2020-07-19 DIAGNOSIS — D869 Sarcoidosis, unspecified: Secondary | ICD-10-CM | POA: Diagnosis not present

## 2020-07-19 DIAGNOSIS — G500 Trigeminal neuralgia: Secondary | ICD-10-CM | POA: Diagnosis not present

## 2020-07-19 DIAGNOSIS — N1832 Chronic kidney disease, stage 3b: Secondary | ICD-10-CM | POA: Diagnosis not present

## 2020-07-19 DIAGNOSIS — I129 Hypertensive chronic kidney disease with stage 1 through stage 4 chronic kidney disease, or unspecified chronic kidney disease: Secondary | ICD-10-CM | POA: Diagnosis not present

## 2020-07-19 DIAGNOSIS — R519 Headache, unspecified: Secondary | ICD-10-CM | POA: Diagnosis not present

## 2020-07-19 NOTE — Telephone Encounter (Signed)
Janett Billow from   [facility] fax # [phone]  Pharmacy requesting 30 day supply of albuterol (VENTOLIN HFA) 108 (90 Base) MCG/ACT inhaler and budesonide (PULMICORT) 0.5 MG/2ML nebulizer solution. Pharmacy advised they do not have e scribe.

## 2020-07-21 ENCOUNTER — Telehealth: Payer: Self-pay | Admitting: Family Medicine

## 2020-07-21 DIAGNOSIS — M48062 Spinal stenosis, lumbar region with neurogenic claudication: Secondary | ICD-10-CM | POA: Diagnosis not present

## 2020-07-21 DIAGNOSIS — M5416 Radiculopathy, lumbar region: Secondary | ICD-10-CM | POA: Diagnosis not present

## 2020-07-21 DIAGNOSIS — M5136 Other intervertebral disc degeneration, lumbar region: Secondary | ICD-10-CM | POA: Diagnosis not present

## 2020-07-21 NOTE — Telephone Encounter (Signed)
I have spoken to four people from here and they are not sure about a billing document.

## 2020-07-21 NOTE — Telephone Encounter (Signed)
Called to check the status of a billing document that she said was faxed on 12/1 for medicare.  Please call to verify and discuss at [phone]

## 2020-07-21 NOTE — Telephone Encounter (Signed)
This call was regarding form for [employer] respiratory medication prescription and supplies form which is located under the media tab.  It was signed and faxed on 07/17/2020.

## 2020-07-21 NOTE — Telephone Encounter (Signed)
Rx's called into pharmacy.

## 2020-07-21 NOTE — Telephone Encounter (Signed)
I spoke with the pharmacy about calling in her rx's, but nothing mentioned about medicare documents. Unsure of what they're referring to.

## 2020-08-02 DIAGNOSIS — F411 Generalized anxiety disorder: Secondary | ICD-10-CM | POA: Diagnosis not present

## 2020-08-03 DIAGNOSIS — I700 Atherosclerosis of aorta: Secondary | ICD-10-CM | POA: Diagnosis not present

## 2020-08-03 DIAGNOSIS — M1611 Unilateral primary osteoarthritis, right hip: Secondary | ICD-10-CM | POA: Diagnosis not present

## 2020-08-03 DIAGNOSIS — M170 Bilateral primary osteoarthritis of knee: Secondary | ICD-10-CM | POA: Diagnosis not present

## 2020-08-06 DIAGNOSIS — M1611 Unilateral primary osteoarthritis, right hip: Secondary | ICD-10-CM | POA: Insufficient documentation

## 2020-08-15 DIAGNOSIS — F411 Generalized anxiety disorder: Secondary | ICD-10-CM | POA: Diagnosis not present

## 2020-08-21 ENCOUNTER — Other Ambulatory Visit: Payer: Self-pay | Admitting: Gastroenterology

## 2020-08-29 ENCOUNTER — Ambulatory Visit: Payer: Self-pay | Admitting: *Deleted

## 2020-08-29 DIAGNOSIS — F411 Generalized anxiety disorder: Secondary | ICD-10-CM | POA: Diagnosis not present

## 2020-08-29 NOTE — Telephone Encounter (Signed)
C/o chest tightness on inhalation that started Saturday or Sunday. Patient reports upper right back pain as well. Cough has decreased and coughing up clear thick mucus. Drinking plenty of fluids. Just completed a class of water aerobics and reports on inhalation chest tightness feels different than before. Denies chest pain, difficulty breathing , SOB, dizziness or lightheadedness. appt scheduled for 09/01/20. Patient requesting to see PCP earlier if appt available. Please advise if PCP triage request to go to ED today. Care advise given. Patient verbalized understanding of care advise and to call back or to go ED if symptoms worsen.   Reason for Disposition . Taking a deep breath makes pain worse  Answer Assessment - Initial Assessment Questions 1. LOCATION: "Where does it hurt?"       Upper portion on chest above right breast  2. RADIATION: "Does the pain go anywhere else?" (e.g., into neck, jaw, arms, back)     Back  3. ONSET: "When did the chest pain begin?" (Minutes, hours or days)      Saturday or Sunday  4. PATTERN "Does the pain come and go, or has it been constant since it started?"  "Does it get worse with exertion?"      Constant . Just did water aerobics  5. DURATION: "How long does it last" (e.g., seconds, minutes, hours)     Constant  6. SEVERITY: "How bad is the pain?"  (e.g., Scale 1-10; mild, moderate, or severe)    - MILD (1-3): doesn't interfere with normal activities     - MODERATE (4-7): interferes with normal activities or awakens from sleep    - SEVERE (8-10): excruciating pain, unable to do any normal activities       Mild  7. CARDIAC RISK FACTORS: "Do you have any history of heart problems or risk factors for heart disease?" (e.g., angina, prior heart attack; diabetes, high blood pressure, high cholesterol, smoker, or strong family history of heart disease)     HTN, high cholesterol 8. PULMONARY RISK FACTORS: "Do you have any history of lung disease?"  (e.g., blood  clots in lung, asthma, emphysema, birth control pills)     Asthma as a child. Has had constant cough , better after nebulizer  9. CAUSE: "What do you think is causing the chest pain?"     Unknown  10. OTHER SYMPTOMS: "Do you have any other symptoms?" (e.g., dizziness, nausea, vomiting, sweating, fever, difficulty breathing, cough)       No  11. PREGNANCY: "Is there any chance you are pregnant?" "When was your last menstrual period?"       na  Protocols used: CHEST PAIN-A-AH

## 2020-08-30 NOTE — Telephone Encounter (Signed)
Please advise where we can put her appt.

## 2020-08-30 NOTE — Progress Notes (Signed)
Name: Melissa [name]   MRN: [id number]    DOB: 04-Sep-1950   Date:08/31/2020       Progress Note  Subjective  Chief Complaint  Chest Tightness  HPI  Camisha states she noticed substernal chest tightness that was constant but at times severe on Sunday, it persisted and she decided to schedule an appointment . She has a history of right side chest pain but this felt different. She states not associated with increase in  SOB, palpitation, diaphoresis, nausea or vomiting. She states pain is not aggravated by deep inspiration , movement or palpation of chest wall. She has been afebrile. She has a history of cardiomyopathy and sarcoidosis. Last echo showed normal EF, she has long term sob due to COVID-19 but stable. She also has a long history of constipation. Takes Linzess and has bowel movements about 5 times per week. Last bowel movement was yesterday. She states pain almost gone today, she felt much better after the passed flatus and had multiple episodes of eructation yesterday and feels silly for coming to the office today   Patient Active Problem List   Diagnosis Date Noted  . Sarcoidosis 05/29/2020  . Aneurysm of celiac artery ([place]) 02/11/2020  . COVID-19 virus infection 09/06/2019  . Localized swelling of right lower leg 09/06/2019  . OSA on CPAP 09/06/2019  . Secondary hyperparathyroidism of renal origin ([place]) 07/21/2019  . Anemia in chronic kidney disease 06/14/2019  . Benign hypertensive kidney disease with chronic kidney disease 06/14/2019  . Sickle cell trait ([place]) 06/14/2019  . Chronic kidney disease, stage 3b ([place]) 06/14/2019  . Chronic bronchitis ([place]) 05/19/2019  . Chronic venous insufficiency 04/15/2019  . Lymphedema 04/15/2019  . Swelling of limb 03/30/2019  . Family history of colon cancer   . Trigeminal neuralgia 09/11/2018  . Cardiac LV ejection fraction of 40-49% 05/07/2017  . Low back pain without sciatica 02/21/2017  . Renal cyst 10/18/2016  . Atherosclerosis of  aorta ([place]) 10/16/2016  . History of colonic polyps 09/25/2016  . Dyslipidemia 12/08/2015  . Gastroesophageal reflux disease without esophagitis 08/11/2015  . Primary osteoarthritis of knee 06/13/2015  . Hip bursitis 06/13/2015  . RLS (restless legs syndrome) 06/13/2015  . Depression, major, in remission ([place]) 06/13/2015  . B12 deficiency 06/13/2015  . History of Roux-en-Y gastric bypass 06/13/2015  . History of iron deficiency anemia 06/13/2015  . Vitamin D deficiency 06/13/2015  . Hypertension, benign 06/13/2015  . Perennial allergic rhinitis with seasonal variation 06/13/2015  . Obesity (BMI 30.0-34.9) 06/13/2015  . Cervical radiculitis 01/04/2014  . Cervical spinal stenosis 01/04/2014  . Cervical osteoarthritis 01/04/2014    Past Surgical History:  Procedure Laterality Date  . BARIATRIC SURGERY    . CESAREAN SECTION     3 or more  . COLONOSCOPY WITH PROPOFOL N/A 03/15/2019   Procedure: COLONOSCOPY WITH PROPOFOL;  Surgeon: [name], MD;  Location: [facility] ENDOSCOPY;  Service: Gastroenterology;  Laterality: N/A;  . GASTRIC BYPASS    . SHOULDER ARTHROSCOPY Right   . TUBAL LIGATION      Family History  Problem Relation Age of Onset  . Hypercholesterolemia Mother   . Heart disease Mother   . Hypertension Mother   . Alcohol abuse Father   . Lung cancer Brother   . Alcohol abuse Brother   . Diabetes Mellitus II Sister   . Hypertension Maternal Grandmother   . Colon cancer Sister   . Breast cancer Neg Hx   . Prostate cancer Neg Hx   .  Bladder Cancer Neg Hx   . Kidney cancer Neg Hx     Social History   Tobacco Use  . Smoking status: Never Smoker  . Smokeless tobacco: Never Used  . Tobacco comment: Second hand smoke exposure  Substance Use Topics  . Alcohol use: Not Currently    Alcohol/week: 0.0 standard drinks     Current Outpatient Medications:  .  albuterol (VENTOLIN HFA) 108 (90 Base) MCG/ACT inhaler, Inhale 2 puffs into the lungs every 6 (six)  hours as needed for wheezing or shortness of breath., Disp: 18 g, Rfl: 0 .  ASHWAGANDHA PO, Take by mouth daily. , Disp: , Rfl:  .  aspirin 81 MG EC tablet, TAKE 1 TABLET BY MOUTH EVERY DAY, Disp: 30 tablet, Rfl: 0 .  atorvastatin (LIPITOR) 40 MG tablet, Take 1 tablet (40 mg total) by mouth daily., Disp: 90 tablet, Rfl: 1 .  azelastine (ASTELIN) 0.1 % nasal spray, 2 SPRAYS IN EACH NOSTRIL DAILY, Disp: 90 mL, Rfl: 0 .  BLACK CURRANT SEED OIL PO, Take by mouth daily. , Disp: , Rfl:  .  budesonide (PULMICORT) 0.5 MG/2ML nebulizer solution, Take 2 mLs (0.5 mg total) by nebulization in the morning and at bedtime., Disp: 120 mL, Rfl: 1 .  carbamazepine (TEGRETOL) 200 MG tablet, Take 200 mg by mouth 3 (three) times daily as needed., Disp: , Rfl:  .  ferrous sulfate 325 (65 FE) MG tablet, Take 325 mg by mouth daily with breakfast., Disp: , Rfl:  .  fluticasone (FLONASE) 50 MCG/ACT nasal spray, 2 sprays to each nostril once daily.  Can use twice daily for the first 3 days, Disp: 16 g, Rfl: 1 .  gabapentin (NEURONTIN) 100 MG capsule, Take 100 mg by mouth at bedtime as needed., Disp: , Rfl:  .  ipratropium-albuterol (DUONEB) 0.5-2.5 (3) MG/3ML SOLN, Take 3 mLs by nebulization every 6 (six) hours as needed., Disp: 360 mL, Rfl: 0 .  linaclotide (LINZESS) 72 MCG capsule, Take 1 capsule (72 mcg total) by mouth daily before breakfast., Disp: 30 capsule, Rfl: 6 .  loratadine (CLARITIN) 10 MG tablet, Take 1 tablet (10 mg total) by mouth daily., Disp: 90 tablet, Rfl: 0 .  losartan (COZAAR) 25 MG tablet, TAKE 1 TABLET BY MOUTH EVERY DAY, Disp: 90 tablet, Rfl: 3 .  magnesium oxide (MAG-OX) 400 MG tablet, Take 400 mg by mouth daily. Reported on 12/08/2015, Disp: , Rfl:  .  Multiple Vitamins-Minerals (CENTRUM SILVER) tablet, Take 1 tablet by mouth daily., Disp: 30 tablet, Rfl: 0 .  pantoprazole (PROTONIX) 40 MG tablet, TAKE 1 TABLET BY MOUTH EVERY DAY, Disp: 90 tablet, Rfl: 1 .  TURMERIC PO, Take by mouth daily. , Disp: ,  Rfl:  .  benzonatate (TESSALON) 100 MG capsule, Take 1-2 capsules (100-200 mg total) by mouth 3 (three) times daily as needed. (Patient not taking: Reported on 08/31/2020), Disp: 100 capsule, Rfl: 1 .  chlorhexidine (PERIDEX) 0.12 % solution, SMARTSIG:By Mouth (Patient not taking: Reported on 08/31/2020), Disp: , Rfl:  .  Cholecalciferol (VITAMIN D3 ADULT GUMMIES PO), Take 2 each by mouth daily.  (Patient not taking: Reported on 08/31/2020), Disp: , Rfl:  .  montelukast (SINGULAIR) 10 MG tablet, Take 1 tablet (10 mg total) by mouth at bedtime. (Patient not taking: Reported on 08/31/2020), Disp: 90 tablet, Rfl: 1 .  traMADol (ULTRAM) 50 MG tablet, Take by mouth. (Patient not taking: Reported on 08/31/2020), Disp: , Rfl:   Allergies  Allergen Reactions  . Contrast Media [  Iodinated Diagnostic Agents]   . Shellfish Allergy     Edema    I personally reviewed active problem list, medication list, allergies and records    ROS  Ten systems reviewed and is negative except as mentioned in HPI Chronic neck/back pain   Objective  Vitals:   08/31/20 1151  BP: 130/86  Pulse: 92  Resp: 16  Temp: 97.7 F (36.5 C)  SpO2: 96%  Weight: 209 lb 6.4 oz (95 kg)  Height: 5\' 11"  (1.803 m)    Body mass index is 29.21 kg/m.  Physical Exam  Constitutional: Patient appears well-developed and well-nourished.  No distress.  HEENT: head atraumatic, normocephalic, pupils equal and reactive to light, neck supple Cardiovascular: Normal rate, regular rhythm and normal heart sounds.  No murmur heard. No BLE edema. No tenderness during palpation of chest wall  Pulmonary/Chest: Effort normal and breath sounds normal. No respiratory distress. Abdominal: Soft.  There is no tenderness. Psychiatric: Patient has a normal mood and affect. behavior is normal. Judgment and thought content normal.  PHQ2/9: Depression screen [facility] 2/9 08/31/2020 07/18/2020 06/23/2020 05/29/2020 03/09/2020  Decreased Interest 0 0 0 0 0  Down,  Depressed, Hopeless 0 0 0 0 0  PHQ - 2 Score 0 0 0 0 0  Altered sleeping 1 - - - 0  Tired, decreased energy 0 - - - 0  Change in appetite 0 - - - 0  Feeling bad or failure about yourself  0 - - - 0  Trouble concentrating 0 - - - 0  Moving slowly or fidgety/restless 0 - - - 0  Suicidal thoughts 0 - - - 0  PHQ-9 Score 1 - - - 0  Difficult doing work/chores Not difficult at all - - - Not difficult at all  Some recent data might be hidden    phq 9 is negative   Fall Risk: Fall Risk  08/31/2020 07/18/2020 06/23/2020 05/29/2020 03/09/2020  Falls in the past year? 0 1 0 0 1  Comment - - - - -  Number falls in past yr: 0 0 0 0 0  Injury with Fall? 0 0 0 0 1  Comment - - - - -  Risk for fall due to : - - - - -  Follow up - - - - -     Assessment & Plan  1. Chest pain, atypical  - EKG 12-Lead - Troponin I - CK Total (and CKMB)  Advised simethicone for gas. Contact cardiologist for sooner visit EKG sinus rhythm  No ST changes

## 2020-08-31 ENCOUNTER — Encounter: Payer: Self-pay | Admitting: Family Medicine

## 2020-08-31 ENCOUNTER — Ambulatory Visit (INDEPENDENT_AMBULATORY_CARE_PROVIDER_SITE_OTHER): Payer: Medicare Other | Admitting: Family Medicine

## 2020-08-31 ENCOUNTER — Other Ambulatory Visit: Payer: Self-pay

## 2020-08-31 VITALS — BP 130/86 | HR 92 | Temp 97.7°F | Resp 16 | Ht 71.0 in | Wt 209.4 lb

## 2020-08-31 DIAGNOSIS — R0789 Other chest pain: Secondary | ICD-10-CM | POA: Diagnosis not present

## 2020-08-31 MED ORDER — SIMETHICONE EXTRA STRENGTH 125 MG PO CAPS: 1.0000 | ORAL_CAPSULE | Freq: Four times a day (QID) | ORAL | 0 refills | Status: DC

## 2020-09-01 ENCOUNTER — Ambulatory Visit: Payer: Self-pay | Admitting: Family Medicine

## 2020-09-01 ENCOUNTER — Other Ambulatory Visit: Payer: Self-pay | Admitting: Family Medicine

## 2020-09-01 DIAGNOSIS — R748 Abnormal levels of other serum enzymes: Secondary | ICD-10-CM

## 2020-09-01 LAB — CK TOTAL AND CKMB (NOT AT ARMC)
CK, MB: 2.3 ng/mL (ref 0–5.0)
Relative Index: 1.3 (ref 0–4.0)
Total CK: 178 U/L — ABNORMAL HIGH (ref 29–143)

## 2020-09-01 LAB — TROPONIN I: Troponin I: 5 ng/L (ref <=47)

## 2020-09-11 ENCOUNTER — Other Ambulatory Visit: Payer: Self-pay | Admitting: Family Medicine

## 2020-09-11 DIAGNOSIS — Z1231 Encounter for screening mammogram for malignant neoplasm of breast: Secondary | ICD-10-CM

## 2020-09-12 DIAGNOSIS — R748 Abnormal levels of other serum enzymes: Secondary | ICD-10-CM | POA: Diagnosis not present

## 2020-09-13 LAB — CK: Total CK: 154 U/L — ABNORMAL HIGH (ref 29–143)

## 2020-09-22 ENCOUNTER — Ambulatory Visit
Admission: RE | Admit: 2020-09-22 | Discharge: 2020-09-22 | Disposition: A | Discharge status: Home / Self Care | Payer: Medicare Other | Source: Ambulatory Visit | Attending: Family Medicine | Admitting: Family Medicine

## 2020-09-22 ENCOUNTER — Other Ambulatory Visit: Payer: Self-pay

## 2020-09-22 DIAGNOSIS — Z1231 Encounter for screening mammogram for malignant neoplasm of breast: Secondary | ICD-10-CM | POA: Diagnosis not present

## 2020-09-23 NOTE — Progress Notes (Signed)
Date:  09/27/2020   ID:  Melissa [name], DOB 07-23-1951, MRN [id number]  Patient Location:  [address] [place] [address]   Provider location:   [facility], [place] office  PCP:  Steele Sizer, MD  Cardiologist:  Arvid Right [facility]  Chief Complaint  Patient presents with  . Other    6 month follow up. Patient c.o chest pain that goes to her back. Meds reviewed verbally with patient.      History of Present Illness:    Melissa [name] is a 70 y.o. female  past medical history of Atherosclerosis of Aorta seen on CT scan 2018:  hyperlipidemia CRI, likely secondary to polycystic kidney Depression Obesity, Roux-en-Y gastric bypass Back pain Who presents for routine follow-up of her aortic atherosclerosis, hyperlipidemia, back and chest pain  In follow-up today reports she is doing very well 2x a week, water aerobics Atypical chest and back pain Work-up with primary care, Elevated CK 178 TNT 5  Was told that she might have Sarcoid in lungs? Working with pulmonary  Reviewed CT chest 03/2020, images pulled up Minimal aortic atherosclerosis, no significant coronary calcification  Denies significant ankle swelling Status change, weight decreasing  Echocardiogram February 11, 2019 reviewed Ejection fraction 50 to 55% Otherwise normal study  EKG personally reviewed by myself on todays visit Shows normal sinus rhythm rate 97 bpm  Other past medical history reviewed CT scan abdomen pelvis  mild aortic atherosclerosis extending into the common iliac arteries And kidney stone noted  Myoview in 10/2015 showed no evidence of significant ischemia, EF 57%, low risk scan.   Echo on 11/24/2015 showed an EF of 35-40%, severe hypokinesis of the mid-apicalanteroseptal and anterior myocardium, Gr1DD, mild TR, PASP normal.   echo on 12/05/2015 to reassess the EF showed an EF of 40-45%, mild diffuse hypokinesis, mild concentric LVH, normal size left atrium,  RVSF normal, PASP normal.  Prior CV studies:   The following studies were reviewed today:   Past Medical History:  Diagnosis Date  . Bruising   . Cervical radiculopathy   . Deficiency of vitamin B   . Depression   . Edema   . Flank pain   . Gross hematuria   . HTN (hypertension)   . Iron deficiency anemia   . Leukopenia   . OA (osteoarthritis)   . Obesity   . OSA on CPAP   . Osteopenia   . Perennial allergic rhinitis   . Renal cysts, acquired, bilateral   . RLS (restless legs syndrome)   . Sickle cell trait ([place])   . Snoring   . Spinal stenosis   . Trigeminal neuralgia    Past Surgical History:  Procedure Laterality Date  . BARIATRIC SURGERY    . CESAREAN SECTION     3 or more  . COLONOSCOPY WITH PROPOFOL N/A 03/15/2019   Procedure: COLONOSCOPY WITH PROPOFOL;  Surgeon: [name], MD;  Location[facility]COPY;  Service: Gastroenterology;  Laterality: N/A;  . GASTRIC BYPASS    . SHOULDER ARTHROSCOPY Right   . TUBAL LIGATION       Current Meds  Medication Sig  . albuterol (VENTOLIN HFA) 108 (90 Base) MCG/ACT inhaler Inhale 2 puffs into the lungs every 6 (six) hours as needed for wheezing or shortness of breath.  . ASHWAGANDHA PO Take by mouth daily.   Marland Kitchen aspirin 81 MG EC tablet TAKE 1 TABLET BY MOUTH EVERY DAY  . atorvastatin (LIPITOR) 40  MG tablet Take 1 tablet (40 mg total) by mouth daily.  Marland Kitchen azelastine (ASTELIN) 0.1 % nasal spray 2 SPRAYS IN EACH NOSTRIL DAILY  . BLACK CURRANT SEED OIL PO Take by mouth daily.   . budesonide (PULMICORT) 0.5 MG/2ML nebulizer solution Take 2 mLs (0.5 mg total) by nebulization in the morning and at bedtime.  . carbamazepine (TEGRETOL) 200 MG tablet Take 200 mg by mouth 3 (three) times daily as needed.  . ferrous sulfate 325 (65 FE) MG tablet Take 325 mg by mouth daily with breakfast.  . fluticasone (FLONASE) 50 MCG/ACT nasal spray 2 sprays to each nostril once daily.  Can use twice daily for the first 3 days  . gabapentin  (NEURONTIN) 100 MG capsule Take 100 mg by mouth at bedtime as needed.  Marland Kitchen ipratropium-albuterol (DUONEB) 0.5-2.5 (3) MG/3ML SOLN Take 3 mLs by nebulization every 6 (six) hours as needed.  . linaclotide (LINZESS) 72 MCG capsule Take 1 capsule (72 mcg total) by mouth daily before breakfast.  . loratadine (CLARITIN) 10 MG tablet Take 1 tablet (10 mg total) by mouth daily.  Marland Kitchen losartan (COZAAR) 25 MG tablet TAKE 1 TABLET BY MOUTH EVERY DAY  . magnesium oxide (MAG-OX) 400 MG tablet Take 400 mg by mouth daily. Reported on 12/08/2015  . montelukast (SINGULAIR) 10 MG tablet Take 1 tablet (10 mg total) by mouth at bedtime.  . Multiple Vitamins-Minerals (CENTRUM SILVER) tablet Take 1 tablet by mouth daily.  . traMADol (ULTRAM) 50 MG tablet Take by mouth.  . TURMERIC PO Take by mouth daily.      Allergies:   Contrast media [iodinated diagnostic agents] and Shellfish allergy   Social History   Tobacco Use  . Smoking status: Never Smoker  . Smokeless tobacco: Never Used  . Tobacco comment: Second hand smoke exposure  Vaping Use  . Vaping Use: Never used  Substance Use Topics  . Alcohol use: Not Currently    Alcohol/week: 0.0 standard drinks  . Drug use: No     Current Outpatient Medications on File Prior to Visit  Medication Sig Dispense Refill  . albuterol (VENTOLIN HFA) 108 (90 Base) MCG/ACT inhaler Inhale 2 puffs into the lungs every 6 (six) hours as needed for wheezing or shortness of breath. 18 g 0  . ASHWAGANDHA PO Take by mouth daily.     Marland Kitchen aspirin 81 MG EC tablet TAKE 1 TABLET BY MOUTH EVERY DAY 30 tablet 0  . atorvastatin (LIPITOR) 40 MG tablet Take 1 tablet (40 mg total) by mouth daily. 90 tablet 1  . azelastine (ASTELIN) 0.1 % nasal spray 2 SPRAYS IN EACH NOSTRIL DAILY 90 mL 0  . BLACK CURRANT SEED OIL PO Take by mouth daily.     . budesonide (PULMICORT) 0.5 MG/2ML nebulizer solution Take 2 mLs (0.5 mg total) by nebulization in the morning and at bedtime. 120 mL 1  . carbamazepine  (TEGRETOL) 200 MG tablet Take 200 mg by mouth 3 (three) times daily as needed.    . ferrous sulfate 325 (65 FE) MG tablet Take 325 mg by mouth daily with breakfast.    . fluticasone (FLONASE) 50 MCG/ACT nasal spray 2 sprays to each nostril once daily.  Can use twice daily for the first 3 days 16 g 1  . gabapentin (NEURONTIN) 100 MG capsule Take 100 mg by mouth at bedtime as needed.    Marland Kitchen ipratropium-albuterol (DUONEB) 0.5-2.5 (3) MG/3ML SOLN Take 3 mLs by nebulization every 6 (six) hours as needed. [place]  mL 0  . linaclotide (LINZESS) 72 MCG capsule Take 1 capsule (72 mcg total) by mouth daily before breakfast. 30 capsule 6  . loratadine (CLARITIN) 10 MG tablet Take 1 tablet (10 mg total) by mouth daily. 90 tablet 0  . losartan (COZAAR) 25 MG tablet TAKE 1 TABLET BY MOUTH EVERY DAY 90 tablet 3  . magnesium oxide (MAG-OX) 400 MG tablet Take 400 mg by mouth daily. Reported on 12/08/2015    . montelukast (SINGULAIR) 10 MG tablet Take 1 tablet (10 mg total) by mouth at bedtime. 90 tablet 1  . Multiple Vitamins-Minerals (CENTRUM SILVER) tablet Take 1 tablet by mouth daily. 30 tablet 0  . traMADol (ULTRAM) 50 MG tablet Take by mouth.    . TURMERIC PO Take by mouth daily.      No current facility-administered medications on file prior to visit.     Family Hx: The patient's family history includes Alcohol abuse in her brother and father; Colon cancer in her sister; Diabetes Mellitus II in her sister; Heart disease in her mother; Hypercholesterolemia in her mother; Hypertension in her maternal grandmother and mother; Lung cancer in her brother. There is no history of Breast cancer, Prostate cancer, Bladder Cancer, or Kidney cancer.  ROS:   Please see the history of present illness.    Review of Systems  Constitutional: Negative.   HENT: Negative.   Respiratory: Negative.   Cardiovascular: Positive for leg swelling.  Gastrointestinal: Negative.   Musculoskeletal: Negative.   Neurological: Negative.    Psychiatric/Behavioral: Negative.   All other systems reviewed and are negative.    Labs/Other Tests and Data Reviewed:    Recent Labs: 03/27/2020: ALT 15; BUN 14; Creatinine, Ser 1.37; Potassium 4.7; Sodium 142 05/11/2020: Hemoglobin 11.8; Platelets 221   Recent Lipid Panel Lab Results  Component Value Date/Time   CHOL 193 03/27/2020 09:56 AM   TRIG 69 03/27/2020 09:56 AM   HDL 88 03/27/2020 09:56 AM   CHOLHDL 2.2 03/27/2020 09:56 AM   CHOLHDL 2.2 07/29/2019 12:00 AM   LDLCALC 92 03/27/2020 09:56 AM   LDLCALC 102 (H) 07/29/2019 12:00 AM    Wt Readings from Last 3 Encounters:  09/27/20 210 lb (95.3 kg)  08/31/20 209 lb 6.4 oz (95 kg)  07/18/20 213 lb 8 oz (96.8 kg)     Exam:   BP 124/74 (BP Location: Left Arm, Patient Position: Sitting, Cuff Size: Normal)   Pulse 97   Ht 5\' 11"  (1.803 m)   Wt 210 lb (95.3 kg)   SpO2 98%   BMI 29.29 kg/m  Constitutional:  oriented to person, place, and time. No distress.  HENT:  Head: Grossly normal Eyes:  no discharge. No scleral icterus.  Neck: No JVD, no carotid bruits  Cardiovascular: Regular rate and rhythm, no murmurs appreciated Pulmonary/Chest: Clear to auscultation bilaterally, no wheezes or rails Abdominal: Soft.  no distension.  no tenderness.  Musculoskeletal: Normal range of motion Neurological:  normal muscle tone. Coordination normal. No atrophy Skin: Skin warm and dry Psychiatric: normal affect, pleasant   ASSESSMENT & PLAN:    Problem List Items Addressed This Visit      Cardiology Problems   Atherosclerosis of aorta ([place]) - Primary    Other Visit Diagnoses    Cardiomyopathy, unspecified type ([place])       Stage 3a chronic kidney disease ([place])       Mixed hyperlipidemia       Dyspnea, unspecified type       History of  COVID-19         Leg edema  Minimal swelling on today's visit  Anemia Likely related to underlying kidney failure Stable  Aortic atherosclerosis Minimal disease on CT scan Continue  current medications, she will work on weight loss, does not want additional medication such as Zetia  Hyperlipidemia Reports compliance with her Lipitor 40 Options discussed with her We will hold off on Zetia  Back and chest pain Atypical in nature, doing more water aerobics 1-1/2 hours 2 times a week likely causing aggravation to scapular/trapezius region radiating through to the front, some muscle tenderness on the right Unable to exclude some back issues, " rib issue" If symptoms persist recommend she change of the exercise routine, ice/heat, NSAIDs, consider chiropractic    Signed, Ida Rogue, MD  09/27/2020 3:27 PM    [facility] [address] [place] [address], [place], [place] [address]

## 2020-09-25 ENCOUNTER — Other Ambulatory Visit: Payer: Self-pay | Admitting: Family Medicine

## 2020-09-25 DIAGNOSIS — R928 Other abnormal and inconclusive findings on diagnostic imaging of breast: Secondary | ICD-10-CM

## 2020-09-25 DIAGNOSIS — N6489 Other specified disorders of breast: Secondary | ICD-10-CM

## 2020-09-26 DIAGNOSIS — G500 Trigeminal neuralgia: Secondary | ICD-10-CM | POA: Diagnosis not present

## 2020-09-26 DIAGNOSIS — M2550 Pain in unspecified joint: Secondary | ICD-10-CM | POA: Diagnosis not present

## 2020-09-26 DIAGNOSIS — E559 Vitamin D deficiency, unspecified: Secondary | ICD-10-CM | POA: Diagnosis not present

## 2020-09-26 DIAGNOSIS — E785 Hyperlipidemia, unspecified: Secondary | ICD-10-CM | POA: Diagnosis not present

## 2020-09-26 DIAGNOSIS — I129 Hypertensive chronic kidney disease with stage 1 through stage 4 chronic kidney disease, or unspecified chronic kidney disease: Secondary | ICD-10-CM | POA: Diagnosis not present

## 2020-09-26 DIAGNOSIS — J849 Interstitial pulmonary disease, unspecified: Secondary | ICD-10-CM | POA: Diagnosis not present

## 2020-09-26 DIAGNOSIS — J449 Chronic obstructive pulmonary disease, unspecified: Secondary | ICD-10-CM | POA: Diagnosis not present

## 2020-09-26 DIAGNOSIS — Z6829 Body mass index (BMI) 29.0-29.9, adult: Secondary | ICD-10-CM | POA: Diagnosis not present

## 2020-09-26 DIAGNOSIS — D860 Sarcoidosis of lung: Secondary | ICD-10-CM | POA: Diagnosis not present

## 2020-09-26 DIAGNOSIS — N189 Chronic kidney disease, unspecified: Secondary | ICD-10-CM | POA: Diagnosis not present

## 2020-09-26 DIAGNOSIS — R590 Localized enlarged lymph nodes: Secondary | ICD-10-CM | POA: Diagnosis not present

## 2020-09-27 ENCOUNTER — Ambulatory Visit (INDEPENDENT_AMBULATORY_CARE_PROVIDER_SITE_OTHER): Payer: Medicare Other | Admitting: Cardiovascular Disease

## 2020-09-27 ENCOUNTER — Encounter: Payer: Self-pay | Admitting: Cardiovascular Disease

## 2020-09-27 ENCOUNTER — Other Ambulatory Visit: Payer: Self-pay

## 2020-09-27 VITALS — BP 124/74 | HR 97 | Ht 71.0 in | Wt 210.0 lb

## 2020-09-27 DIAGNOSIS — I700 Atherosclerosis of aorta: Secondary | ICD-10-CM | POA: Diagnosis not present

## 2020-09-27 DIAGNOSIS — I429 Cardiomyopathy, unspecified: Secondary | ICD-10-CM | POA: Diagnosis not present

## 2020-09-27 DIAGNOSIS — Z8616 Personal history of COVID-19: Secondary | ICD-10-CM

## 2020-09-27 DIAGNOSIS — N1831 Chronic kidney disease, stage 3a: Secondary | ICD-10-CM

## 2020-09-27 DIAGNOSIS — R0600 Dyspnea, unspecified: Secondary | ICD-10-CM

## 2020-09-27 DIAGNOSIS — E782 Mixed hyperlipidemia: Secondary | ICD-10-CM | POA: Diagnosis not present

## 2020-09-27 NOTE — Patient Instructions (Signed)
Medication Instructions:  No changes  If you need a refill on your cardiac medications before your next appointment, please call your pharmacy.    Lab work: No new labs needed   If you have labs (blood work) drawn today and your tests are completely normal, you will receive your results only by: . MyChart Message (if you have MyChart) OR . A paper copy in the mail If you have any lab test that is abnormal or we need to change your treatment, we will call you to review the results.   Testing/Procedures: No new testing needed   Follow-Up: At [facility], you and your health needs are our priority.  As part of our continuing mission to provide you with exceptional heart care, we have created designated Provider Care Teams.  These Care Teams include your primary Cardiologist (physician) and Advanced Practice Providers (APPs -  Physician Assistants and Nurse Practitioners) who all work together to provide you with the care you need, when you need it.  . You will need a follow up appointment in 12 months  . Providers on your designated Care Team:   . Christopher Berge, NP . Ryan Dunn, [name] . Jacquelyn Visser, [name]  Any Other Special Instructions Will Be Listed Below (If Applicable).  COVID-19 Vaccine Information can be found at: [email or web address] For questions related to vaccine distribution or appointments, please email [email or web address] or call [phone].     

## 2020-10-04 ENCOUNTER — Ambulatory Visit
Admission: RE | Admit: 2020-10-04 | Discharge: 2020-10-04 | Disposition: A | Discharge status: Home / Self Care | Payer: Medicare Other | Source: Ambulatory Visit | Attending: Family Medicine | Admitting: Family Medicine

## 2020-10-04 ENCOUNTER — Other Ambulatory Visit: Payer: Self-pay

## 2020-10-04 DIAGNOSIS — N6489 Other specified disorders of breast: Secondary | ICD-10-CM | POA: Diagnosis not present

## 2020-10-04 DIAGNOSIS — R928 Other abnormal and inconclusive findings on diagnostic imaging of breast: Secondary | ICD-10-CM | POA: Insufficient documentation

## 2020-10-04 DIAGNOSIS — R922 Inconclusive mammogram: Secondary | ICD-10-CM | POA: Diagnosis not present

## 2020-10-08 NOTE — Discharge Instructions (Signed)
Instructions after Total Knee Replacement   Melissa [name], Jr., Melissa [name].     Dept. of Orthopaedics & Sports Medicine  [facility]  [address]  [place], [place]  [address]  Phone: [phone]   Fax: [phone]    DIET: Drink plenty of non-alcoholic fluids. Resume your normal diet. Include foods high in fiber.  ACTIVITY:  You may use crutches or a walker with weight-bearing as tolerated, unless instructed otherwise. You may be weaned off of the walker or crutches by your Physical Therapist.  Do NOT place pillows under the knee. Anything placed under the knee could limit your ability to straighten the knee.   Continue doing gentle exercises. Exercising will reduce the pain and swelling, increase motion, and prevent muscle weakness.   Please continue to use the TED compression stockings for 6 weeks. You may remove the stockings at night, but should reapply them in the morning. Do not drive or operate any equipment until instructed.  WOUND CARE:  Continue to use the PolarCare or ice packs periodically to reduce pain and swelling. You may bathe or shower after the staples are removed at the first office visit following surgery.  MEDICATIONS: You may resume your regular medications. Please take the pain medication as prescribed on the medication. Do not take pain medication on an empty stomach. You have been given a prescription for a blood thinner (Lovenox or Coumadin). Please take the medication as instructed. (NOTE: After completing a 2 week course of Lovenox, take one Enteric-coated aspirin once a day. This along with elevation will help reduce the possibility of phlebitis in your operated leg.) Do not drive or drink alcoholic beverages when taking pain medications.  CALL THE OFFICE FOR: Temperature above 101 degrees Excessive bleeding or drainage on the dressing. Excessive swelling, coldness, or paleness of the toes. Persistent nausea and vomiting.  FOLLOW-UP:  You  should have an appointment to return to the office in 10-14 days after surgery. Arrangements have been made for continuation of Physical Therapy (either home therapy or outpatient therapy).   [facility] Department Directory         [email or web address]       [email or web address]          Cardiology  Appointments: [place] - [phone] [place] - [phone]  Endocrinology  Appointments: [place] - [phone] [place] - [phone]  Gastroenterology  Appointments: [place] - [phone] [place] - [phone]        General Surgery   Appointments: [place] - [phone]  Internal Medicine/Family Medicine  Appointments: [place] - [phone] [place] - [phone] [place] - [phone]  Metabolic and Weigh Loss Surgery  Appointments: [place] - [phone]        Neurology  Appointments: [place] - [phone] [place] - [phone]  Neurosurgery  Appointments: [place] - [phone]  Obstetrics & Gynecology  Appointments: [place] - [phone] [place] - [phone]        Pediatrics  Appointments: [place] - [phone] [place] - [phone]  Physiatry  Appointments: [place] -[phone]  Physical Therapy  Appointments: [place] - [phone] [place] - [phone]        Podiatry  Appointments: [place] - [phone] [place] - [phone]  Pulmonology  Appointments: [place] - [phone]  Rheumatology  Appointments: [place] - [phone]        [place] Location: [facility]  [address] [place], [place]  [address]  [place] Location: [facility] [address] [place], [place]  [address]  [place] Location: [facility] [address] [place],   [address]    

## 2020-10-10 ENCOUNTER — Other Ambulatory Visit: Payer: Self-pay

## 2020-10-10 ENCOUNTER — Ambulatory Visit: Payer: Self-pay

## 2020-10-10 ENCOUNTER — Telehealth: Payer: Self-pay | Admitting: *Deleted

## 2020-10-10 ENCOUNTER — Encounter
Admission: RE | Admit: 2020-10-10 | Discharge: 2020-10-10 | Disposition: A | Discharge status: Home / Self Care | Payer: Medicare Other | Source: Ambulatory Visit | Attending: Orthopedic Surgery | Admitting: Orthopedic Surgery

## 2020-10-10 DIAGNOSIS — Z01818 Encounter for other preprocedural examination: Secondary | ICD-10-CM | POA: Diagnosis not present

## 2020-10-10 HISTORY — DX: Pneumonia, unspecified organism: J18.9

## 2020-10-10 LAB — URINALYSIS, ROUTINE W REFLEX MICROSCOPIC
Bacteria, UA: NONE SEEN
Bilirubin Urine: NEGATIVE
Glucose, UA: NEGATIVE mg/dL
Hgb urine dipstick: NEGATIVE
Ketones, ur: NEGATIVE mg/dL
Nitrite: NEGATIVE
Protein, ur: NEGATIVE mg/dL
Specific Gravity, Urine: 1.011 (ref 1.005–1.030)
pH: 5 (ref 5.0–8.0)

## 2020-10-10 LAB — CBC
HCT: 36.8 % (ref 36.0–46.0)
Hemoglobin: 12.2 g/dL (ref 12.0–15.0)
MCH: 29.8 pg (ref 26.0–34.0)
MCHC: 33.2 g/dL (ref 30.0–36.0)
MCV: 89.8 fL (ref 80.0–100.0)
Platelets: 234 10*3/uL (ref 150–400)
RBC: 4.1 MIL/uL (ref 3.87–5.11)
RDW: 12.5 % (ref 11.5–15.5)
WBC: 5.3 10*3/uL (ref 4.0–10.5)
nRBC: 0 % (ref 0.0–0.2)

## 2020-10-10 LAB — COMPREHENSIVE METABOLIC PANEL
ALT: 24 U/L (ref 0–44)
AST: 35 U/L (ref 15–41)
Albumin: 3.8 g/dL (ref 3.5–5.0)
Alkaline Phosphatase: 68 U/L (ref 38–126)
Anion gap: 7 (ref 5–15)
BUN: 20 mg/dL (ref 8–23)
CO2: 27 mmol/L (ref 22–32)
Calcium: 9.6 mg/dL (ref 8.9–10.3)
Chloride: 105 mmol/L (ref 98–111)
Creatinine, Ser: 1.38 mg/dL — ABNORMAL HIGH (ref 0.44–1.00)
GFR, Estimated: 42 mL/min — ABNORMAL LOW (ref >60)
Glucose, Bld: 62 mg/dL — ABNORMAL LOW (ref 70–99)
Potassium: 3.5 mmol/L (ref 3.5–5.1)
Sodium: 139 mmol/L (ref 135–145)
Total Bilirubin: 0.7 mg/dL (ref 0.3–1.2)
Total Protein: 7.5 g/dL (ref 6.5–8.1)

## 2020-10-10 LAB — PROTIME-INR
INR: 1 (ref 0.8–1.2)
Prothrombin Time: 12.7 seconds (ref 11.4–15.2)

## 2020-10-10 LAB — APTT: aPTT: 27 seconds (ref 24–36)

## 2020-10-10 LAB — TYPE AND SCREEN
ABO/RH(D): AB POS
Antibody Screen: NEGATIVE

## 2020-10-10 LAB — C-REACTIVE PROTEIN: CRP: 0.6 mg/dL (ref <1.0)

## 2020-10-10 LAB — SURGICAL PCR SCREEN
MRSA, PCR: NEGATIVE
Staphylococcus aureus: NEGATIVE

## 2020-10-10 LAB — SEDIMENTATION RATE: Sed Rate: 33 mm/hr — ABNORMAL HIGH (ref 0–30)

## 2020-10-10 NOTE — Telephone Encounter (Signed)
Request for pre-operative cardiac clearance Received: Today Karen Kitchens, NP  P C[facility]  Request for pre-operative cardiac clearance:    1. What type of surgery is being performed?  RIGHT COMPUTER ASSISTED TOTAL KNEE ARTHROPLASTY   2. When is this surgery scheduled?  10/18/2020    3. Are there any medications that need to be held prior to surgery?  ASA   4. Practice name and name of physician performing surgery?  Performing surgeon: Dr. Skip Estimable, MD  Requesting clearance: Honor Loh, FNP-C     5. Anesthesia type (none, local, MAC, general)? Regional + General   6. What is the office phone and fax number?   Phone: [phone]  Fax: [phone]   ATTENTION: Unable to create telephone message as per your standard workflow. Directed by [facility] providers to send requests for cardiac clearance to this pool for appropriate distribution to provider covering pre-operative clearances.   Honor Loh, MSN, APRN, FNP-C, CEN  [facility]  Peri-operative Services Nurse Practitioner  Phone: [phone]  10/10/20 2:39 PM

## 2020-10-10 NOTE — Telephone Encounter (Signed)
   Primary Cardiologist: Ida Rogue, MD  Chart reviewed as part of pre-operative protocol coverage. Miss Melissa [name] has a hx of aortic atherosclerosis, CKDIII, HTN, HLD, OSA on CPAP, cardiomyopathy. Most recent echo 03/2020 with normal LVEF 55-60%, no wall motion abnormalities, and no significant valvular disease. She was last seen by Dr. Rockey Situ 09/27/20 doing well from cardiac perspective with only complaint of back pain presumed musculoskeletal from water aerobics. She was recommended for follow up in 1 year.   Will route to Dr. Rockey Situ regarding recommendations for Aspirin.   Loel Dubonnet, NP 10/10/2020, 3:16 PM

## 2020-10-10 NOTE — Patient Instructions (Addendum)
Your procedure is scheduled on: 10/18/20 Report to the Registration Desk on the 1st floor of the [facility]. To find out your arrival time, please call [phone] between 1PM - 3PM on: 10/17/20   REMEMBER: Instructions that are not followed completely may result in serious medical risk, up to and including death; or upon the discretion of your surgeon and anesthesiologist your surgery may need to be rescheduled.  Do not eat food or drink any fluids after midnight the night before surgery.  No gum chewing, lozengers or hard candies.  -  your doctor has ordered for you to drink the provided  Ensure Pre-Surgery Clear Carbohydrate Drink  Drinking this carbohydrate drink up to two hours before surgery helps to reduce insulin resistance and improve patient outcomes. Please complete drinking 2 hours prior to scheduled arrival time.  TAKE THESE MEDICATIONS THE MORNING OF SURGERY WITH A SIP OF WATER: - fluticasone (FLONASE) 50 MCG/ACT nasal spray - loratadine (CLARITIN) 10 MG tablet - azelastine (ASTELIN) 0.1 % nasal spray   Use inhaler albuterol (VENTOLIN HFA) 108 (90 Base) MCG/ACT inhaler on the day of surgery and bring to the hospital.  Follow recommendations from Cardiologist, Pulmonologist or PCP regarding stopping Aspirin, Coumadin, Plavix, Eliquis, Pradaxa, or Pletal.  One week prior to surgery: Stop Anti-inflammatories (NSAIDS) such as Advil, Aleve, Ibuprofen, Motrin, Naproxen, Naprosyn and Aspirin based products such as Excedrin, Goodys Powder, BC Powder.  Stop 10/11/20, ANY OVER THE COUNTER supplements until after surgery, magnesium oxide (MAG-OX) 400 MG tablet (However, you may continue taking Vitamin D, Vitamin B, and multivitamin up until the day before surgery.)  No Alcohol for 24 hours before or after surgery.  No Smoking including e-cigarettes for 24 hours prior to surgery.  No chewable tobacco products for at least 6 hours prior to surgery.  No nicotine patches on the  day of surgery.  Do not use any "recreational" drugs for at least a week prior to your surgery.  Please be advised that the combination of cocaine and anesthesia may have negative outcomes, up to and including death. If you test positive for cocaine, your surgery will be cancelled.  On the morning of surgery brush your teeth with toothpaste and water, you may rinse your mouth with mouthwash if you wish. Do not swallow any toothpaste or mouthwash.  Do not wear jewelry, make-up, hairpins, clips or nail polish.  Do not wear lotions, powders, or perfumes.   Do not shave body from the neck down 48 hours prior to surgery just in case you cut yourself which could leave a site for infection.  Also, freshly shaved skin may become irritated if using the CHG soap.  Contact lenses, hearing aids and dentures may not be worn into surgery.  Do not bring valuables to the hospital. [facility] is not responsible for any missing/lost belongings or valuables.   Use CHG Soap or wipes as directed on instruction sheet.  Bring your C-PAP to the hospital with you in case you may have to spend the night.   Notify your doctor if there is any change in your medical condition (cold, fever, infection).  Wear comfortable clothing (specific to your surgery type) to the hospital.  Plan for stool softeners for home use; pain medications have a tendency to cause constipation. You can also help prevent constipation by eating foods high in fiber such as fruits and vegetables and drinking plenty of fluids as your diet allows.  After surgery, you can help prevent lung complications  by doing breathing exercises.  Take deep breaths and cough every 1-2 hours. Your doctor may order a device called an Incentive Spirometer to help you take deep breaths. When coughing or sneezing, hold a pillow firmly against your incision with both hands. This is called "splinting." Doing this helps protect your incision. It also decreases  belly discomfort.  If you are being admitted to the hospital overnight, leave your suitcase in the car. After surgery it may be brought to your room.  If you are being discharged the day of surgery, you will not be allowed to drive home. You will need a responsible adult (18 years or older) to drive you home and stay with you that night.   If you are taking public transportation, you will need to have a responsible adult (18 years or older) with you. Please confirm with your physician that it is acceptable to use public transportation.   Please call the [facility] at [phone] if you have any questions about these instructions.  Visitation Policy:  Patients undergoing a surgery or procedure may have one family member or support person with them as long as that person is not COVID-19 positive or experiencing its symptoms.  That person may remain in the waiting area during the procedure.  Inpatient Visitation:    Visiting hours are 7 a.m. to 8 p.m. Patients will be allowed one visitor. The visitor may change daily. The visitor must pass COVID-19 screenings, use hand sanitizer when entering and exiting the patient's room and wear a mask at all times, including in the patient's room. Patients must also wear a mask when staff or their visitor are in the room. Masking is required regardless of vaccination status. Systemwide, no visitors 17 or younger.

## 2020-10-12 ENCOUNTER — Encounter: Payer: Self-pay | Admitting: Orthopedic Surgery

## 2020-10-12 ENCOUNTER — Other Ambulatory Visit: Payer: Self-pay

## 2020-10-12 ENCOUNTER — Ambulatory Visit (INDEPENDENT_AMBULATORY_CARE_PROVIDER_SITE_OTHER): Payer: Medicare Other

## 2020-10-12 VITALS — BP 122/76 | HR 95 | Temp 97.5°F | Resp 18 | Ht 71.0 in | Wt 210.2 lb

## 2020-10-12 DIAGNOSIS — Z0000 Encounter for general adult medical examination without abnormal findings: Secondary | ICD-10-CM

## 2020-10-12 NOTE — Progress Notes (Signed)
Perioperative Services  Pre-Admission/Anesthesia Testing Clinical Review  Date: 10/13/20  Patient Demographics:  Name: Melissa [name] DOB:   [birth year] MRN:   [id number]  Planned Surgical Procedure(s):    Case: [id number] Date/Time: 10/18/20 1229   Procedure: COMPUTER ASSISTED TOTAL KNEE ARTHROPLASTY (Right Knee)   Anesthesia type: Choice   Pre-op diagnosis: PRIMARY OSTEOARTHRITIS OF RIGHT KNEE.   Location: [facility] OR ROOM 01 / [place] ORS FOR ANESTHESIA GROUP   Surgeons: Dereck Leep, MD    NOTE: Available PAT nursing documentation and vital signs have been reviewed. Clinical nursing staff has updated patient's PMH/PSHx, current medication list, and drug allergies/intolerances to ensure comprehensive history available to assist in medical decision making as it pertains to the aforementioned surgical procedure and anticipated anesthetic course.   Clinical Discussion:  Melissa [name] is a 70 y.o. female who is submitted for pre-surgical anesthesia review and clearance prior to her undergoing the above procedure. Patient has never been a smoker. Pertinent PMH includes: cardiomyopathy, aortic atherosclerosis, celiac artery aneurysm, aortic atherosclerosis, HTN, HLD, CKD-III, OSAH (requires nocturnal PAP therapy), ILD, sarcoidosis, cervical spine stenosis with associated radiculopathy, anemia, RNY bypass, OA, RLS  Patient is followed by cardiology Rockey Situ, MD). She was last seen in the cardiology clinic on 09/27/2020; notes reviewed.  At the time of her clinic visit, patient noted to be doing well overall from a cardiovascular perspective. She denied shortness of breath, orthopnea, PND, palpitations, peripheral edema, vertiginous symptoms, and presyncope/syncope.  Patient with a PMH (+) for cardiac diagnoses.  Myocardial perfusion imaging study performed in 10/2015 revealed no evidence of stress-induced myocardial ischemia or arrhythmia; LVEF 57%.  Last TTE performed in 03/2020 revealed a normal  left ventricular systolic function with an EF of 55-60%.  Patient underwent CT imaging of the chest on 04/12/2020 that revealed aortic atherosclerosis as well as atherosclerotic plaque in the LAD and left circumflex coronary arteries (see full interpretation of cardiovascular testing below).  Patient making efforts to remain active.  Patient participating in water aerobics twice a week. Functional capacity, as defined by DASI, is documented as being >/= 4 METS. She did report some episodes of atypical chest pain and back pain, however this was felt to be musculoskeletal in nature.  ECG in the office with revealed NSR at a rate of 97 bpm. Patient on GDMT for her HTN and HLD diagnoses.  Blood pressure well controlled at 124/74 on currently prescribed ARB monotherapy.  Patient is on a statin for her HLD.  Cardiologist recommended adding ezetimibe for risk factor modification due to patient's aortic atherosclerosis, however patient declined wishing to work on further weight loss following her RNY bypass.  No other changes were made to patient's medication regimen.  Patient to follow-up with outpatient cardiology in 1 year or sooner if needed  Patient scheduled to undergo an elective orthopedic procedure on 10/18/2020 with Dr. Skip Estimable.  Given patient's past medical history significant for cardiovascular disease, presurgical cardiac clearance was sought by the PAT team.  Per cardiology, "based on patient's past medical history and time since his last clinic visit, patient would be at an overall ACCEPTABLE risk for the planned procedure without further cardiovascular testing or intervention at this time". This patient is on daily antiplatelet therapy.  She has been instructed on recommendations from her surgeon for holding her daily low-dose ASA.  Patient denies previous perioperative complications with anesthesia in the past. In review of the available records, it is noted that patient underwent a general  anesthetic  course here (ASA III) in 02/2019 without documented complications.   Vitals with BMI 10/12/2020 10/10/2020 09/27/2020  Height 5\' 11"  5\' 11"  5\' 11"   Weight 210 lbs 3 oz 212 lbs 1 oz 210 lbs  BMI 29.33 97.35 32.9  Systolic 924 268 341  Diastolic 76 74 74  Pulse 95 105 97    Providers/Specialists:   NOTE: Primary physician provider listed below. Patient may have been seen by APP or partner within same practice.   PROVIDER ROLE / SPECIALTY LAST OV  Hooten, Laurice Record, MD Orthopedics (Surgeon)  08/03/2020  Steele Sizer, MD Primary Care Provider  08/31/2020  Ida Rogue, MD Cardiology  09/27/2020  Loni Muse, MD Pulmonary Medicine  09/26/2020   Allergies:  Contrast media [iodinated diagnostic agents] and Shellfish allergy  Current Home Medications:   No current facility-administered medications for this encounter.   Marland Kitchen acetaminophen (TYLENOL) 650 MG CR tablet  . albuterol (VENTOLIN HFA) 108 (90 Base) MCG/ACT inhaler  . aspirin 81 MG EC tablet  . atorvastatin (LIPITOR) 40 MG tablet  . azelastine (ASTELIN) 0.1 % nasal spray  . budesonide (PULMICORT) 0.5 MG/2ML nebulizer solution  . calcium carbonate (TUMS - DOSED IN MG ELEMENTAL CALCIUM) 500 MG chewable tablet  . carbamazepine (TEGRETOL) 200 MG tablet  . ferrous sulfate 325 (65 FE) MG tablet  . fluticasone (FLONASE) 50 MCG/ACT nasal spray  . gabapentin (NEURONTIN) 100 MG capsule  . ipratropium-albuterol (DUONEB) 0.5-2.5 (3) MG/3ML SOLN  . linaclotide (LINZESS) 72 MCG capsule  . loratadine (CLARITIN) 10 MG tablet  . losartan (COZAAR) 25 MG tablet  . magnesium oxide (MAG-OX) 400 MG tablet  . Multiple Vitamins-Minerals (CENTRUM SILVER) tablet  . Ascorbic Acid (VITAMIN C) 1000 MG tablet  . montelukast (SINGULAIR) 10 MG tablet  . zinc gluconate 50 MG tablet   History:   Past Medical History:  Diagnosis Date  . Aortic atherosclerosis ([place])   . Bruising   . Cardiomyopathy ([place])   . Celiac artery aneurysm ([place])    . Cervical radiculopathy   . CKD (chronic kidney disease), stage III ([place])   . Deficiency of vitamin B   . Depression   . Edema   . Flank pain   . Gross hematuria   . History of 2019 novel coronavirus disease (COVID-19) 05/01/2019  . History of bariatric surgery   . HLD (hyperlipidemia)   . HTN (hypertension)   . Interstitial lung disease ([place])   . Iron deficiency anemia   . Leukopenia   . OA (osteoarthritis)   . Obesity   . OSA on CPAP   . Osteopenia   . Perennial allergic rhinitis   . Pneumonia due to COVID-19 virus 2020  . Renal cysts, acquired, bilateral   . RLS (restless legs syndrome)   . Sarcoidosis   . Sickle cell trait ([place])   . Snoring   . Spinal stenosis   . Trigeminal neuralgia    Past Surgical History:  Procedure Laterality Date  . BARIATRIC SURGERY    . CESAREAN SECTION     3 or more  . COLONOSCOPY WITH PROPOFOL N/A 03/15/2019   Procedure: COLONOSCOPY WITH PROPOFOL;  Surgeon: [name], MD;  Location[facility] ENDOSCOPY;  Service: Gastroenterology;  Laterality: N/A;  . GASTRIC BYPASS    . SHOULDER ARTHROSCOPY Right   . TUBAL LIGATION     Family History  Problem Relation Age of Onset  . Hypercholesterolemia Mother   . Heart disease Mother   . Hypertension Mother   .  Alcohol abuse Father   . Lung cancer Brother   . Alcohol abuse Brother   . Diabetes Mellitus II Sister   . Hypertension Maternal Grandmother   . Colon cancer Sister   . Breast cancer Neg Hx   . Prostate cancer Neg Hx   . Bladder Cancer Neg Hx   . Kidney cancer Neg Hx    Social History   Tobacco Use  . Smoking status: Never Smoker  . Smokeless tobacco: Never Used  . Tobacco comment: Second hand smoke exposure  Vaping Use  . Vaping Use: Never used  Substance Use Topics  . Alcohol use: Not Currently    Alcohol/week: 0.0 standard drinks  . Drug use: No    Pertinent Clinical Results:  LABS: Labs reviewed: Acceptable for surgery.  No visits with results within 3 Day(s)  from this visit.  Latest known visit with results is:  Hospital Outpatient Visit on 10/10/2020  Component Date Value Ref Range Status  . CRP 10/10/2020 0.6  <1.0 mg/dL Final   Performed at [facility] [address], [place], [place] [address]  . MRSA, PCR 10/10/2020 NEGATIVE  NEGATIVE Final  . Staphylococcus aureus 10/10/2020 NEGATIVE  NEGATIVE Final   Comment: (NOTE) The Xpert SA Assay (FDA approved for NASAL specimens in patients 10 years of age and older), is one component of a comprehensive surveillance program. It is not intended to diagnose infection nor to guide or monitor treatment. Performed at [facility], [address], [place], [place] [address]   . Sed Rate 10/10/2020 33* 0 - 30 mm/hr Final   Performed at [facility], [address] [place], [place] [address]  . WBC 10/10/2020 5.3  4.0 - 10.5 K/uL Final  . RBC 10/10/2020 4.10  3.87 - 5.11 MIL/uL Final  . Hemoglobin 10/10/2020 12.2  12.0 - 15.0 g/dL Final  . HCT 10/10/2020 36.8  36.0 - 46.0 % Final  . MCV 10/10/2020 89.8  80.0 - 100.0 fL Final  . MCH 10/10/2020 29.8  26.0 - 34.0 pg Final  . MCHC 10/10/2020 33.2  30.0 - 36.0 g/dL Final  . RDW 10/10/2020 12.5  11.5 - 15.5 % Final  . Platelets 10/10/2020 234  150 - 400 K/uL Final  . nRBC 10/10/2020 0.0  0.0 - 0.2 % Final   Performed at [facility], [address] [place], [place] [address]  . Sodium 10/10/2020 139  135 - 145 mmol/L Final  . Potassium 10/10/2020 3.5  3.5 - 5.1 mmol/L Final  . Chloride 10/10/2020 105  98 - 111 mmol/L Final  . CO2 10/10/2020 27  22 - 32 mmol/L Final  . Glucose, Bld 10/10/2020 62* 70 - 99 mg/dL Final   Glucose reference range applies only to samples taken after fasting for at least 8 hours.  . BUN 10/10/2020 20  8 - 23 mg/dL Final  . Creatinine, Ser 10/10/2020 1.38* 0.44 - 1.00 mg/dL Final  . Calcium 10/10/2020 9.6  8.9 - 10.3 mg/dL Final  . Total Protein 10/10/2020 7.5  6.5 - 8.1 g/dL Final  .  Albumin 10/10/2020 3.8  3.5 - 5.0 g/dL Final  . AST 10/10/2020 35  15 - 41 U/L Final  . ALT 10/10/2020 24  0 - 44 U/L Final  . Alkaline Phosphatase 10/10/2020 68  38 - 126 U/L Final  . Total Bilirubin 10/10/2020 0.7  0.3 - 1.2 mg/dL Final  . GFR, Estimated 10/10/2020 42* >60 mL/min Final   Comment: (  NOTE) Calculated using the CKD-EPI Creatinine Equation (2021)   . Anion gap 10/10/2020 7  5 - 15 Final   Performed at [facility], [address] [place], [place] [address]  . Prothrombin Time 10/10/2020 12.7  11.4 - 15.2 seconds Final  . INR 10/10/2020 1.0  0.8 - 1.2 Final   Comment: (NOTE) INR goal varies based on device and disease states. Performed at [facility], [address], [place], [place] [address]   . aPTT 10/10/2020 27  24 - 36 seconds Final   Performed at [facility], [address] [place], [place] [address]  . Color, Urine 10/10/2020 YELLOW* YELLOW Final  . APPearance 10/10/2020 CLEAR* CLEAR Final  . Specific Gravity, Urine 10/10/2020 1.011  1.005 - 1.030 Final  . pH 10/10/2020 5.0  5.0 - 8.0 Final  . Glucose, UA 10/10/2020 NEGATIVE  NEGATIVE mg/dL Final  . Hgb urine dipstick 10/10/2020 NEGATIVE  NEGATIVE Final  . Bilirubin Urine 10/10/2020 NEGATIVE  NEGATIVE Final  . Ketones, ur 10/10/2020 NEGATIVE  NEGATIVE mg/dL Final  . Protein, ur 10/10/2020 NEGATIVE  NEGATIVE mg/dL Final  . Nitrite 10/10/2020 NEGATIVE  NEGATIVE Final  . Chalmers Guest 10/10/2020 TRACE* NEGATIVE Final  . RBC / HPF 10/10/2020 0-5  0 - 5 RBC/hpf Final  . WBC, UA 10/10/2020 0-5  0 - 5 WBC/hpf Final  . Bacteria, UA 10/10/2020 NONE SEEN  NONE SEEN Final  . Squamous Epithelial / LPF 10/10/2020 0-5  0 - 5 Final   Performed at [facility], [address] [place], [place] [address]  . Specimen Description 10/10/2020    Final                   Value:URINE, RANDOM Performed at [facility], [address] [place], [place] [address]   . Special  Requests 10/10/2020    Final                   Value:NONE Performed at [facility], [place] [address] [place], [place] [address]   . Culture 10/10/2020 30,000 COLONIES/mL ESCHERICHIA COLI*  Final  . Report Status 10/10/2020 10/13/2020 FINAL   Final  . Organism ID, Bacteria 10/10/2020 ESCHERICHIA COLI*  Final  . ABO/RH(D) 10/10/2020 AB POS   Final  . Antibody Screen 10/10/2020 NEG   Final  . Sample Expiration 10/10/2020 10/24/2020,2359   Final  . Extend sample reason 10/10/2020    Final                   Value:NO TRANSFUSIONS OR PREGNANCY IN THE PAST 3 MONTHS Performed at [facility], [address] [place], [place] [address]     ECG: Date: 09/27/2020 Time ECG obtained: 0918 AM Rate: 97 bpm Rhythm: normal sinus Axis (leads I and aVF): Normal Intervals: PR 130 ms. QRS 90 ms. QTc 477 ms. ST segment and T wave changes: No evidence of acute ST segment elevation or depression Comparison: Similar to previous tracing obtained on 08/31/2020.   IMAGING / PROCEDURES: PULMONARY FUNCTION TESTING performed on 09/26/2020    ECHOCARDIOGRAM performed on 04/17/2020 1. Left ventricular ejection fraction, by estimation, is 55 to 60%. The left ventricle has normal function. Left ventricular endocardial border not optimally defined to evaluate regional wall motion. Left ventricular  diastolic parameters were normal.  2. Right ventricular systolic function is normal. The right ventricular size is normal. There is normal pulmonary artery systolic pressure.  3. The mitral valve is  normal in structure. No evidence of mitral valve regurgitation. No evidence of mitral stenosis.  4. The aortic valve is normal in structure. Aortic valve regurgitation is not visualized. Mild aortic valve sclerosis is present, with no evidence of aortic valve stenosis.  5. The inferior vena cava is normal in size with greater than 50% respiratory variability, suggesting right atrial pressure of 3 mmHg.   CT  CHEST WITHOUT CONTRAST performed on 04/12/2020 1. There is a spectrum of findings in the thorax which are very similar to prior examinations, strongly favored to reflect a systemic disease such as sarcoidosis. Further clinical evaluation is recommended. 2. Aortic atherosclerosis, in addition to 2 vessel coronary artery disease. Please note that although the presence of coronary artery calcium documents the presence of coronary artery disease, the severity of this disease and any potential stenosis cannot be assessed on this non-gated CT examination. Assessment for potential risk factor modification, dietary therapy or pharmacologic therapy may be warranted, if clinically indicated. 3. Nonobstructive calculi in the collecting systems of both kidneys measuring up to 5 mm in the interpolar collecting system of the left kidney.    PULMONARY FUNCTION TESTING performed on 04/07/2020     LEXISCAN performed on 11/17/2015 1. LVEF 57% 2. No regional wall motion abnormalities 3. No evidence of stress-induced myocardial ischemia or arrhythmia. 4. Low risk scan  Impression and Plan:  ARPI DIEBOLD has been referred for pre-anesthesia review and clearance prior to her undergoing the planned anesthetic and procedural courses. Available labs, pertinent testing, and imaging results were personally reviewed by me. This patient has been appropriately cleared by cardiology with an overall ACCEPTABLE risk of significant perioperative cardiovascular complications.  Based on clinical review performed today (10/13/20), barring any significant acute changes in the patient's overall condition, it is anticipated that she will be able to proceed with the planned surgical intervention. Any acute changes in clinical condition may necessitate her procedure being postponed and/or cancelled. Pre-surgical instructions were reviewed with the patient during her PAT appointment and questions were fielded by PAT clinical  staff.  Honor Loh, MSN, APRN, FNP-C, CEN [facility]  Peri-operative Services Nurse Practitioner Phone: [phone] 10/13/20 8:33 AM  NOTE: This note has been prepared using Dragon dictation software. Despite my best ability to proofread, there is always the potential that unintentional transcriptional errors may still occur from this process.

## 2020-10-12 NOTE — Progress Notes (Signed)
Subjective:   Melissa [name] is a 70 y.o. female who presents for Medicare Annual (Subsequent) preventive examination.  Review of Systems     Cardiac Risk Factors include: advanced age (>70men, >22 women);dyslipidemia;hypertension     Objective:    Today's Vitals   10/12/20 1439 10/12/20 1441  BP: 122/76   Pulse: 95   Resp: 18   Temp: (!) 97.5 F (36.4 C)   TempSrc: Oral   SpO2: 99%   Weight: 210 lb 3.2 oz (95.3 kg)   Height: 5\' 11"  (1.803 m)   PainSc:  4    Body mass index is 29.32 kg/m.  Advanced Directives 10/12/2020 10/10/2020 10/08/2019 05/04/2019 05/01/2019 03/15/2019 05/07/2017  Does Patient Have a Medical Advance Directive? No No No No No No No  Would patient like information on creating a medical advance directive? Yes (MAU/Ambulatory/Procedural Areas - Information given) - Yes (MAU/Ambulatory/Procedural Areas - Information given) - - - -    Current Medications (verified) Outpatient Encounter Medications as of 10/12/2020  Medication Sig  . acetaminophen (TYLENOL) 650 MG CR tablet Take 1,300 mg by mouth every 8 (eight) hours as needed for pain.  Marland Kitchen albuterol (VENTOLIN HFA) 108 (90 Base) MCG/ACT inhaler Inhale 2 puffs into the lungs every 6 (six) hours as needed for wheezing or shortness of breath.  . Ascorbic Acid (VITAMIN C) 1000 MG tablet Take 1,000 mg by mouth daily.  Marland Kitchen aspirin 81 MG EC tablet TAKE 1 TABLET BY MOUTH EVERY DAY (Patient taking differently: Take 81 mg by mouth daily.)  . atorvastatin (LIPITOR) 40 MG tablet Take 1 tablet (40 mg total) by mouth daily.  Marland Kitchen azelastine (ASTELIN) 0.1 % nasal spray 2 SPRAYS IN EACH NOSTRIL DAILY (Patient taking differently: Place 2 sprays into both nostrils daily.)  . budesonide (PULMICORT) 0.5 MG/2ML nebulizer solution Take 2 mLs (0.5 mg total) by nebulization in the morning and at bedtime.  . calcium carbonate (TUMS - DOSED IN MG ELEMENTAL CALCIUM) 500 MG chewable tablet Chew 500-1,000 mg by mouth daily as needed for  indigestion or heartburn.  . carbamazepine (TEGRETOL) 200 MG tablet Take 200 mg by mouth 3 (three) times daily as needed (trigeminal neuralgia).  . ferrous sulfate 325 (65 FE) MG tablet Take 325 mg by mouth daily with breakfast.  . fluticasone (FLONASE) 50 MCG/ACT nasal spray 2 sprays to each nostril once daily.  Can use twice daily for the first 3 days (Patient taking differently: Place 2 sprays into both nostrils daily.)  . gabapentin (NEURONTIN) 100 MG capsule Take 100 mg by mouth at bedtime as needed (pain).  Marland Kitchen ipratropium-albuterol (DUONEB) 0.5-2.5 (3) MG/3ML SOLN Take 3 mLs by nebulization every 6 (six) hours as needed. (Patient taking differently: Take 3 mLs by nebulization every 6 (six) hours as needed (shortness of breath).)  . linaclotide (LINZESS) 72 MCG capsule Take 1 capsule (72 mcg total) by mouth daily before breakfast.  . loratadine (CLARITIN) 10 MG tablet Take 1 tablet (10 mg total) by mouth daily.  Marland Kitchen losartan (COZAAR) 25 MG tablet TAKE 1 TABLET BY MOUTH EVERY DAY (Patient taking differently: Take 25 mg by mouth daily.)  . magnesium oxide (MAG-OX) 400 MG tablet Take 400 mg by mouth daily.  . montelukast (SINGULAIR) 10 MG tablet Take 1 tablet (10 mg total) by mouth at bedtime.  . Multiple Vitamins-Minerals (CENTRUM SILVER) tablet Take 1 tablet by mouth daily.  Marland Kitchen zinc gluconate 50 MG tablet Take 50 mg by mouth daily.  . [DISCONTINUED] TURMERIC PO Take  100 mg by mouth daily.   No facility-administered encounter medications on file as of 10/12/2020.    Allergies (verified) Contrast media [iodinated diagnostic agents] and Shellfish allergy   History: Past Medical History:  Diagnosis Date  . Aortic atherosclerosis ([place])   . Bruising   . Cardiomyopathy ([place])   . 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 Cervical radiculopathy   . CKD (chronic kidney disease), stage III ([place])   . Deficiency of vitamin B   . Depression   . Edema   . Flank pain   . Gross hematuria   . History of 2019  novel coronavirus disease (COVID-19) 05/01/2019  . History of bariatric surgery   . HLD (hyperlipidemia)   . HTN (hypertension)   . Interstitial lung disease ([place])   . Iron deficiency anemia   . Leukopenia   . OA (osteoarthritis)   . Obesity   . OSA on CPAP   . Osteopenia   . Perennial allergic rhinitis   . Pneumonia due to COVID-19 virus 2020  . Renal cysts, acquired, bilateral   . RLS (restless legs syndrome)   . Sarcoidosis   . Sickle cell trait ([place])   . Snoring   . Spinal stenosis   . Trigeminal neuralgia    Past Surgical History:  Procedure Laterality Date  . BARIATRIC SURGERY    . CESAREAN SECTION     3 or more  . COLONOSCOPY WITH PROPOFOL N/A 03/15/2019   Procedure: COLONOSCOPY WITH PROPOFOL;  Surgeon: [name], MD;  Location: [facility] ENDOSCOPY;  Service: Gastroenterology;  Laterality: N/A;  . GASTRIC BYPASS    . SHOULDER ARTHROSCOPY Right   . TUBAL LIGATION     Family History  Problem Relation Age of Onset  . Hypercholesterolemia Mother   . Heart disease Mother   . Hypertension Mother   . Alcohol abuse Father   . Lung cancer Brother   . Alcohol abuse Brother   . Diabetes Mellitus II Sister   . Hypertension Maternal Grandmother   . Colon cancer Sister   . Breast cancer Neg Hx   . Prostate cancer Neg Hx   . Bladder Cancer Neg Hx   . Kidney cancer Neg Hx    Social History   Socioeconomic History  . Marital status: Widowed    Spouse name: Shellyann Wandrey  . Number of children: 4  . Years of education: Not on file  . Highest education level: Some college, no degree  Occupational History    Comment: retired  Tobacco Use  . Smoking status: Never Smoker  . Smokeless tobacco: Never Used  . Tobacco comment: Second hand smoke exposure  Vaping Use  . Vaping Use: Never used  Substance and Sexual Activity  . Alcohol use: Not Currently    Alcohol/week: 0.0 standard drinks  . Drug use: No  . Sexual activity: Yes    Partners: Male    Birth  control/protection: Post-menopausal  Other Topics Concern  . Not on file  Social History Narrative   Pt lives her son    Social Determinants of Health   Financial Resource Strain: Low Risk   . Difficulty of Paying Living Expenses: Not hard at all  Food Insecurity: No Food Insecurity  . Worried About Charity fundraiser in the Last Year: Never true  . Ran Out of Food in the Last Year: Never true  Transportation Needs: No Transportation Needs  . Lack of Transportation (Medical): No  . Lack of Transportation (Non-Medical):  No  Physical Activity: Sufficiently Active  . Days of Exercise per Week: 2 days  . Minutes of Exercise per Session: 90 min  Stress: No Stress Concern Present  . Feeling of Stress : Not at all  Social Connections: Socially Isolated  . Frequency of Communication with Friends and Family: More than three times a week  . Frequency of Social Gatherings with Friends and Family: More than three times a week  . Attends Religious Services: Never  . Active Member of Clubs or Organizations: No  . Attends Archivist Meetings: Never  . Marital Status: Widowed    Tobacco Counseling Counseling given: Not Answered Comment: Second hand smoke exposure   Clinical Intake:  Pre-visit preparation completed: Yes  Pain : 0-10 Pain Score: 4  Pain Type: Chronic pain Pain Location: Knee Pain Orientation: Right Pain Descriptors / Indicators: Aching,Sore Pain Onset: More than a month ago Pain Frequency: Constant     BMI - recorded: 29.32 Nutritional Status: BMI 25 -29 Overweight Nutritional Risks: None Diabetes: No  How often do you need to have someone help you when you read instructions, pamphlets, or other written materials from your doctor or pharmacy?: 1 - Never    Interpreter Needed?: No  Information entered by :: Clemetine Marker LPN   Activities of Daily Living In your present state of health, do you have any difficulty performing the following  activities: 10/12/2020 10/10/2020  Hearing? N N  Comment declines hearing aids -  Vision? N N  Difficulty concentrating or making decisions? N N  Walking or climbing stairs? Y Y  Dressing or bathing? N N  Doing errands, shopping? N N  Preparing Food and eating ? N -  Using the Toilet? N -  In the past six months, have you accidently leaked urine? N -  Do you have problems with loss of bowel control? N -  Managing your Medications? N -  Managing your Finances? N -  Housekeeping or managing your Housekeeping? N -  Some recent data might be hidden    Patient Care Team: Steele Sizer, MD as PCP - General (Family Medicine) Minna Merritts, MD as PCP - Cardiology (Cardiology) Steele Sizer, MD as Attending Physician (Family Medicine) Lucilla Lame, MD as Consulting Physician (Gastroenterology) Anabel Bene, MD as Referring Physician (Neurology) Sharlet Salina, MD as Referring Physician (Physical Medicine and Rehabilitation) Hooten, Laurice Record, MD (Orthopedic Surgery)  Indicate any recent Medical Services you may have received from other than [facility] providers in the past year (date may be approximate).     Assessment:   This is a routine wellness examination for Melissa [name].  Hearing/Vision screen  Hearing Screening   125Hz  250Hz  500Hz  1000Hz  2000Hz  3000Hz  4000Hz  6000Hz  8000Hz   Right ear:           Left ear:           Comments: Pt declines hearing difficulty  Vision Screening Comments: Annual vision screenings with [facility]  Dietary issues and exercise activities discussed: Current Exercise Habits: Structured exercise class, Type of exercise: Other - see comments (water aerobics), Time (Minutes): > 60, Frequency (Times/Week): 2, Weekly Exercise (Minutes/Week): 0, Intensity: Moderate, Exercise limited by: orthopedic condition(s)  Goals    . Patient Stated     Patient states she is looking forward to planting a vegetable garden this year      Depression  Screen PHQ 2/9 Scores 10/12/2020 08/31/2020 07/18/2020 06/23/2020 05/29/2020 03/09/2020 01/26/2020  PHQ - 2 Score 0 0  0 0 0 0 0  PHQ- 9 Score - 1 - - - 0 0    Fall Risk Fall Risk  10/12/2020 08/31/2020 07/18/2020 06/23/2020 05/29/2020  Falls in the past year? 1 0 1 0 0  Comment - - - - -  Number falls in past yr: 0 0 0 0 0  Injury with Fall? 0 0 0 0 0  Comment - - - - -  Risk for fall due to : No Fall Risks - - - -  Follow up Falls prevention discussed - - - -    FALL RISK PREVENTION PERTAINING TO THE HOME:  Any stairs in or around the home? Yes  If so, are there any without handrails? No  Home free of loose throw rugs in walkways, pet beds, electrical cords, etc? Yes  Adequate lighting in your home to reduce risk of falls? Yes   ASSISTIVE DEVICES UTILIZED TO PREVENT FALLS:  Life alert? No  Use of a cane, walker or w/c? No  Grab bars in the bathroom? Yes  Shower chair or bench in shower? Yes  Elevated toilet seat or a handicapped toilet? No   TIMED UP AND GO:  Was the test performed? Yes .  Length of time to ambulate 10 feet: 7 sec.   Gait steady and fast without use of assistive device  Cognitive Function:        Immunizations Immunization History  Administered Date(s) Administered  . Fluad Quad(high Dose 65+) 07/01/2019, 07/20/2020  . Influenza, High Dose Seasonal PF 07/02/2018  . Influenza,inj,Quad PF,6+ Mos 06/13/2015, 08/21/2016, 05/07/2017  . PFIZER(Purple Top)SARS-COV-2 Vaccination 10/02/2019, 10/23/2019, 05/12/2020  . Pneumococcal Conjugate-13 02/24/2018  . Pneumococcal Polysaccharide-23 01/26/2020  . Tdap 04/29/2011  . Zoster 08/21/2016  . Zoster Recombinat (Shingrix) 02/24/2018, 10/29/2018    TDAP status: Up to date  Flu Vaccine status: Up to date  Pneumococcal vaccine status: Up to date  Covid-19 vaccine status: Completed vaccines  Qualifies for Shingles Vaccine? Yes   Zostavax completed Yes   Shingrix Completed?: Yes  Screening Tests Health  Maintenance  Topic Date Due  . COVID-19 Vaccine (4 - Booster for Pfizer series) 11/09/2020  . TETANUS/TDAP  04/28/2021  . COLONOSCOPY (Pts 45-81yrs Insurance coverage will need to be confirmed)  03/14/2022  . MAMMOGRAM  09/22/2022  . INFLUENZA VACCINE  Completed  . DEXA SCAN  Completed  . Hepatitis C Screening  Completed  . PNA vac Low Risk Adult  Completed    Health Maintenance  There are no preventive care reminders to display for this patient.  Colorectal cancer screening: Type of screening: Colonoscopy. Completed 03/15/19. Repeat every 3 years  Mammogram status: Completed 09/22/20. Repeat every year  Bone Density status: Completed 09/08/18. Results reflect: Bone density results: NORMAL. Repeat every 2 years.  Lung Cancer Screening: (Low Dose CT Chest recommended if Age 57-80 years, 30 pack-year currently smoking OR have quit w/in 15years.) does not qualify.   Additional Screening:  Hepatitis C Screening: does qualify; Completed 11/13/12  Vision Screening: Recommended annual ophthalmology exams for early detection of glaucoma and other disorders of the eye. Is the patient up to date with their annual eye exam?  Yes  Who is the provider or what is the name of the office in which the patient attends annual eye exams? [place]   Dental Screening: Recommended annual dental exams for proper oral hygiene  Community Resource Referral / Chronic Care Management: CRR required this visit?  No   CCM required  this visit?  No      Plan:     I have personally reviewed and noted the following in the patient's chart:   . Medical and social history . Use of alcohol, tobacco or illicit drugs  . Current medications and supplements . Functional ability and status . Nutritional status . Physical activity . Advanced directives . List of other physicians . Hospitalizations, surgeries, and ER visits in previous 12 months . Vitals . Screenings to include cognitive, depression,  and falls . Referrals and appointments  In addition, I have reviewed and discussed with patient certain preventive protocols, quality metrics, and best practice recommendations. A written personalized care plan for preventive services as well as general preventive health recommendations were provided to patient.     Clemetine Marker, LPN   [id number]   Nurse Notes: pt scheduled for right knee replacement next week

## 2020-10-12 NOTE — Patient Instructions (Signed)
Melissa [name] , Thank you for taking time to come for your Medicare Wellness Visit. I appreciate your ongoing commitment to your health goals. Please review the following plan we discussed and let me know if I can assist you in the future.   Screening recommendations/referrals: Colonoscopy: done 03/15/19. Repeat in 2023 Mammogram: done 09/22/20 Bone Density: done 09/08/18 Recommended yearly ophthalmology/optometry visit for glaucoma screening and checkup Recommended yearly dental visit for hygiene and checkup  Vaccinations: Influenza vaccine: done 07/20/20 Pneumococcal vaccine: done 01/26/20 Tdap vaccine: done 04/29/11 Shingles vaccine: done 02/24/18 & 10/29/18   Covid-19: done 10/02/19, 10/23/19 & 05/12/20  Advanced directives: Advance directive discussed with you today. I have provided a copy for you to complete at home and have notarized. Once this is complete please bring a copy in to our office so we can scan it into your chart.  Conditions/risks identified: Recommend fall prevention in the home post knee replacement surgery   Next appointment: Follow up in one year for your annual wellness visit    Preventive Care 65 Years and Older, Female Preventive care refers to lifestyle choices and visits with your health care provider that can promote health and wellness. What does preventive care include?  A yearly physical exam. This is also called an annual well check.  Dental exams once or twice a year.  Routine eye exams. Ask your health care provider how often you should have your eyes checked.  Personal lifestyle choices, including:  Daily care of your teeth and gums.  Regular physical activity.  Eating a healthy diet.  Avoiding tobacco and drug use.  Limiting alcohol use.  Practicing safe sex.  Taking low-dose aspirin every day.  Taking vitamin and mineral supplements as recommended by your health care provider. What happens during an annual well check? The services and  screenings done by your health care provider during your annual well check will depend on your age, overall health, lifestyle risk factors, and family history of disease. Counseling  Your health care provider may ask you questions about your:  Alcohol use.  Tobacco use.  Drug use.  Emotional well-being.  Home and relationship well-being.  Sexual activity.  Eating habits.  History of falls.  Memory and ability to understand (cognition).  Work and work Statistician.  Reproductive health. Screening  You may have the following tests or measurements:  Height, weight, and BMI.  Blood pressure.  Lipid and cholesterol levels. These may be checked every 5 years, or more frequently if you are over 45 years old.  Skin check.  Lung cancer screening. You may have this screening every year starting at age 36 if you have a 30-pack-year history of smoking and currently smoke or have quit within the past 15 years.  Fecal occult blood test (FOBT) of the stool. You may have this test every year starting at age 42.  Flexible sigmoidoscopy or colonoscopy. You may have a sigmoidoscopy every 5 years or a colonoscopy every 10 years starting at age 91.  Hepatitis C blood test.  Hepatitis B blood test.  Sexually transmitted disease (STD) testing.  Diabetes screening. This is done by checking your blood sugar (glucose) after you have not eaten for a while (fasting). You may have this done every 1-3 years.  Bone density scan. This is done to screen for osteoporosis. You may have this done starting at age 53.  Mammogram. This may be done every 1-2 years. Talk to your health care provider about how often you should have  regular mammograms. Talk with your health care provider about your test results, treatment options, and if necessary, the need for more tests. Vaccines  Your health care provider may recommend certain vaccines, such as:  Influenza vaccine. This is recommended every  year.  Tetanus, diphtheria, and acellular pertussis (Tdap, Td) vaccine. You may need a Td booster every 10 years.  Zoster vaccine. You may need this after age 35.  Pneumococcal 13-valent conjugate (PCV13) vaccine. One dose is recommended after age 26.  Pneumococcal polysaccharide (PPSV23) vaccine. One dose is recommended after age 76. Talk to your health care provider about which screenings and vaccines you need and how often you need them. This information is not intended to replace advice given to you by your health care provider. Make sure you discuss any questions you have with your health care provider. Document Released: 09/01/2015 Document Revised: 04/24/2016 Document Reviewed: 06/06/2015 Elsevier Interactive Patient Education  2017 [place] Prevention in the Home Falls can cause injuries. They can happen to people of all ages. There are many things you can do to make your home safe and to help prevent falls. What can I do on the outside of my home?  Regularly fix the edges of walkways and driveways and fix any cracks.  Remove anything that might make you trip as you walk through a door, such as a raised step or threshold.  Trim any bushes or trees on the path to your home.  Use bright outdoor lighting.  Clear any walking paths of anything that might make someone trip, such as rocks or tools.  Regularly check to see if handrails are loose or broken. Make sure that both sides of any steps have handrails.  Any raised decks and porches should have guardrails on the edges.  Have any leaves, snow, or ice cleared regularly.  Use sand or salt on walking paths during winter.  Clean up any spills in your garage right away. This includes oil or grease spills. What can I do in the bathroom?  Use night lights.  Install grab bars by the toilet and in the tub and shower. Do not use towel bars as grab bars.  Use non-skid mats or decals in the tub or shower.  If you  need to sit down in the shower, use a plastic, non-slip stool.  Keep the floor dry. Clean up any water that spills on the floor as soon as it happens.  Remove soap buildup in the tub or shower regularly.  Attach bath mats securely with double-sided non-slip rug tape.  Do not have throw rugs and other things on the floor that can make you trip. What can I do in the bedroom?  Use night lights.  Make sure that you have a light by your bed that is easy to reach.  Do not use any sheets or blankets that are too big for your bed. They should not hang down onto the floor.  Have a firm chair that has side arms. You can use this for support while you get dressed.  Do not have throw rugs and other things on the floor that can make you trip. What can I do in the kitchen?  Clean up any spills right away.  Avoid walking on wet floors.  Keep items that you use a lot in easy-to-reach places.  If you need to reach something above you, use a strong step stool that has a grab bar.  Keep electrical cords out of the  way.  Do not use floor polish or wax that makes floors slippery. If you must use wax, use non-skid floor wax.  Do not have throw rugs and other things on the floor that can make you trip. What can I do with my stairs?  Do not leave any items on the stairs.  Make sure that there are handrails on both sides of the stairs and use them. Fix handrails that are broken or loose. Make sure that handrails are as long as the stairways.  Check any carpeting to make sure that it is firmly attached to the stairs. Fix any carpet that is loose or worn.  Avoid having throw rugs at the top or bottom of the stairs. If you do have throw rugs, attach them to the floor with carpet tape.  Make sure that you have a light switch at the top of the stairs and the bottom of the stairs. If you do not have them, ask someone to add them for you. What else can I do to help prevent falls?  Wear shoes  that:  Do not have high heels.  Have rubber bottoms.  Are comfortable and fit you well.  Are closed at the toe. Do not wear sandals.  If you use a stepladder:  Make sure that it is fully opened. Do not climb a closed stepladder.  Make sure that both sides of the stepladder are locked into place.  Ask someone to hold it for you, if possible.  Clearly mark and make sure that you can see:  Any grab bars or handrails.  First and last steps.  Where the edge of each step is.  Use tools that help you move around (mobility aids) if they are needed. These include:  Canes.  Walkers.  Scooters.  Crutches.  Turn on the lights when you go into a dark area. Replace any light bulbs as soon as they burn out.  Set up your furniture so you have a clear path. Avoid moving your furniture around.  If any of your floors are uneven, fix them.  If there are any pets around you, be aware of where they are.  Review your medicines with your doctor. Some medicines can make you feel dizzy. This can increase your chance of falling. Ask your doctor what other things that you can do to help prevent falls. This information is not intended to replace advice given to you by your health care provider. Make sure you discuss any questions you have with your health care provider. Document Released: 06/01/2009 Document Revised: 01/11/2016 Document Reviewed: 09/09/2014 Elsevier Interactive Patient Education  2017 [employer].

## 2020-10-13 LAB — URINE CULTURE: Culture: 30000 — AB

## 2020-10-13 NOTE — Telephone Encounter (Signed)
   Primary Cardiologist: Ida Rogue, MD  Chart reviewed as part of pre-operative protocol coverage. Received request for review from [facility] surgical preop team. Given past medical history and time since last visit, based on ACC/AHA guidelines, BETHEL SIROIS would be at acceptable risk for the planned procedure without further cardiovascular testing.   Length of Aspirin hold is pending Dr. Donivan Scull input however as she has history of aortic atherosclerosis but no coronary artery disease nor previous stenting, low suspicion there would be contraindication to hold.  I will route this recommendation to the requesting party via Epic fax function.  Please call with questions.  Loel Dubonnet, NP 10/13/2020, 8:25 AM

## 2020-10-15 ENCOUNTER — Encounter: Payer: Self-pay | Admitting: Orthopedic Surgery

## 2020-10-15 NOTE — H&P (Signed)
ORTHOPAEDIC HISTORY & PHYSICAL Gwenlyn Fudge, [place] - 10/13/2020 10:00 AM EST Formatting of this note is different from the original. [place] [facility] Chief Complaint:   Chief Complaint  Patient presents with  . Knee Pain  H & P RIGHT KNEE   History of Present Illness:   Melissa [name] is a 70 y.o. female with a that presents to clinic today for her preoperative history and evaluation. Patient presents unaccompanied. The patient is scheduled to undergo a right total knee arthroplasty on 10/18/20 by Dr. Marry Guan. Her pain began several years ago. The pain is located primarily along the lateral aspect of the knees. She describes her pain as worse with weightbearing. She is also limited in her ability to ambulate long distances. She reports associated swelling and some giving way of the knees. She denies associated numbness or tingling, denies locking.   The patient's symptoms have progressed to the point that they decrease her quality of life. The patient has previously undergone conservative treatment including NSAIDS and injections to the knee without adequate control of her symptoms.  Denies significant cardiac history, history of blood clots, or lumbar surgery. No significant drug allergies.   Past Medical, Surgical, Family, Social History, Allergies, Medications:   Past Medical History:  Past Medical History:  Diagnosis Date  . Bilateral renal cysts  . Hypertension  . Kidney stones  . Leukopenia  . Obesity  . Restless leg syndrome  . Sleep apnea  . Spinal stenosis, lumbar  Dr. Arnoldo Morale  . Vitamin B12 deficiency   Past Surgical History:  Past Surgical History:  Procedure Laterality Date  . CESAREAN SECTION  x3  . SHOULDER SURGERY   Current Medications:  Current Outpatient Medications  Medication Sig Dispense Refill  . acetaminophen (TYLENOL) 650 MG ER tablet Take 1,300 mg by mouth every 8 (eight) hours as needed  . albuterol  (PROVENTIL) 2.5 mg /3 mL (0.083 %) nebulizer solution Take 2.5 mg by nebulization every 6 (six) hours as needed  . albuterol 90 mcg/actuation inhaler Inhale 2 inhalations into the lungs every 4 (four) hours as needed  . ascorbic acid, vitamin C, (VITAMIN C) 1000 MG tablet Take 1,000 mg by mouth once daily  . aspirin 81 MG EC tablet TAKE 1 TABLET BY MOUTH EVERY DAY  . atorvastatin (LIPITOR) 40 MG tablet 1 tablet once daily  . azelastine (ASTELIN) 137 mcg nasal spray Place 1 spray into both nostrils 2 (two) times daily  . budesonide (PULMICORT) 0.25 mg/2 mL nebulizer solution Inhale 0.25 mg into the lungs once daily  . calcium carbonate (TUMS) 200 mg calcium (500 mg) chewable tablet Take 1 tablet by mouth 2 (two) times daily as needed  . carBAMazepine (TEGRETOL) 200 mg tablet Take 200 mg by mouth 3 (three) times daily  . docusate (COLACE) 100 MG capsule Take 100 mg by mouth 2 (two) times daily as needed  . ferrous sulfate 325 (65 FE) MG EC tablet Take 325 mg by mouth once daily.  . fluticasone propionate (FLONASE) 50 mcg/actuation nasal spray SPRAY 2 SPRAYS INTO EACH NOSTRIL EVERY DAY  . gabapentin (NEURONTIN) 100 MG capsule Take 100 mg by mouth nightly  . ipratropium-albuteroL (DUO-NEB) nebulizer solution Inhale 3 mLs into the lungs 4 (four) times daily as needed  . LINZESS 72 mcg capsule 72 mcg once daily  . loratadine (CLARITIN) 10 mg tablet Take 10 mg by mouth once daily  . losartan (COZAAR) 25 MG tablet Take 25 mg by  mouth once daily. as directed 5  . magnesium oxide (MAG-OX) 400 mg tablet Take 400 mg by mouth once daily.  . ranitidine (ZANTAC) 150 MG tablet Take 1 tablet by mouth once daily as needed  . zinc gluconate 50 mg tablet Take 50 mg by mouth once daily   No current facility-administered medications for this visit.   Allergies:  Allergies  Allergen Reactions  . Iodinated Contrast Media Swelling and Angioedema  Added due to swelling with shellfish  . Shellfish Containing  Products Angioedema  Swelling of face, eyes, and lips   Social History:  Social History   Socioeconomic History  . Marital status: Widowed  Spouse name: Not on file  . Number of children: 4  . Years of education: 74  . Highest education level: High school graduate  Occupational History  . Occupation: Retired  Tobacco Use  . Smoking status: Never Smoker  . Smokeless tobacco: Never Used  Vaping Use  . Vaping Use: Never used  Substance and Sexual Activity  . Alcohol use: Never  . Drug use: Not on file  . Sexual activity: Not on file  Other Topics Concern  . Not on file  Social History Narrative  Melissa [name] is divorced and works as an Editor, commissioning for [employer]. She denies tobacco or alcohol use.   Social Determinants of Health   Financial Resource Strain: Not on file  Food Insecurity: Not on file  Transportation Needs: Not on file  Physical Activity: Not on file  Stress: Not on file  Social Connections: Not on file  Housing Stability: Not on file   Family History:  Family History  Problem Relation Age of Onset  . High blood pressure (Hypertension) Other  . Diabetes Other   Review of Systems:   A 10+ ROS was performed, reviewed, and the pertinent orthopaedic findings are documented in the HPI.   Physical Examination:   BP 120/82 (BP Location: Left upper arm, Patient Position: Sitting, BP Cuff Size: Adult)  Ht 182.9 cm (6')  Wt 94.4 kg (208 lb 3.2 oz)  BMI 28.24 kg/m   Patient is a well-developed, well-nourished female in no acute distress. Patient has normal mood and affect. Patient is alert and oriented to person, place, and time.   HEENT: Atraumatic, normocephalic. Pupils equal and reactive to light. Extraocular motion intact. Noninjected sclera.  Cardiovascular: Regular rate and rhythm, with no murmurs, rubs, or gallops. Distal pulses palpable. No bruits.  Respiratory: Lungs clear to auscultation bilaterally.   Right Knee: Soft tissue  swelling: mild Effusion: minimal Erythema: none Crepitance: mild Tenderness: lateral Alignment: relative valgus Mediolateral laxity: lateral pseudolaxity Posterior sag: negative Patellar tracking: Good tracking without evidence of subluxation or tilt Atrophy: No significant atrophy.  Quadriceps tone was fair to good. Range of motion: 0/10/106 degrees  Sensation intact over the saphenous, lateral sural cutaneous, superficial fibular, and deep fibular nerve distributions.  Tests Performed/Reviewed:  X-rays  No new radiographs were obtained today. Previous radiographs were reviewed of the right knee and revealed complete loss of lateral compartment joint space with significant osteophyte formation and subchondral sclerosis. Medial compartment shows moderate loss of joint space with osteophyte formation. Mild to moderate loss of patellofemoral joint space with osteophyte formation noted. No fractures.  Impression:   ICD-10-CM  1. Primary osteoarthritis of right knee M17.11   Plan:   The patient has end-stage degenerative changes of the right knee. It was explained to the patient that the condition is progressive in  nature. Having failed conservative treatment, the patient has elected to proceed with a total joint arthroplasty. The patient will undergo a total joint arthroplasty with Dr. Marry Guan. The risks of surgery, including blood clot and infection, were discussed with the patient. Measures to reduce these risks, including the use of anticoagulation, perioperative antibiotics, and early ambulation were discussed. The importance of postoperative physical therapy was discussed with the patient. The patient elects to proceed with surgery. The patient is instructed to stop all blood thinners prior to surgery. The patient is instructed to call the hospital the day before surgery to learn of the proper arrival time.   Contact our office with any questions or concerns. Follow up as indicated, or  sooner should any new problems arise, if conditions worsen, or if they are otherwise concerned.   Gwenlyn Fudge, [name] [facility] [place], [place] [address] Phone: [phone]  This note was generated in part with voice recognition software and I apologize for any typographical errors that were not detected and corrected.   Electronically signed by Gwenlyn Fudge, PA at 10/13/2020 1:10 PM EST

## 2020-10-15 NOTE — Telephone Encounter (Signed)
She can stop asa 5 days before procedure Reviewing her data, Not sure if she needs to restart aspirin as minimal aorta plaque

## 2020-10-16 ENCOUNTER — Other Ambulatory Visit: Payer: Self-pay

## 2020-10-16 ENCOUNTER — Other Ambulatory Visit
Admission: RE | Admit: 2020-10-16 | Discharge: 2020-10-16 | Disposition: A | Discharge status: Home / Self Care | Payer: Medicare Other | Source: Ambulatory Visit | Attending: Orthopedic Surgery | Admitting: Orthopedic Surgery

## 2020-10-16 DIAGNOSIS — Z20822 Contact with and (suspected) exposure to covid-19: Secondary | ICD-10-CM | POA: Insufficient documentation

## 2020-10-16 DIAGNOSIS — Z01812 Encounter for preprocedural laboratory examination: Secondary | ICD-10-CM | POA: Insufficient documentation

## 2020-10-16 LAB — SARS CORONAVIRUS 2 (TAT 6-24 HRS): SARS Coronavirus 2: NEGATIVE

## 2020-10-16 NOTE — Telephone Encounter (Signed)
   Primary Cardiologist: Ida Rogue, MD  Chart reviewed as part of pre-operative protocol coverage.   Please see notes from Laurann Montana, NP and Ida Rogue, MD regarding cardiac clearance and hold ASA.  Please call with questions. Richardson Dopp, [name] 10/16/2020, 8:01 AM

## 2020-10-16 NOTE — Telephone Encounter (Signed)
Notes faxed to surgeon. This phone note will be removed from the preop pool. Richardson Dopp, [name]  10/16/2020 8:03 AM

## 2020-10-18 ENCOUNTER — Inpatient Hospital Stay: Payer: Medicare Other | Admitting: Urgent Care

## 2020-10-18 ENCOUNTER — Inpatient Hospital Stay
Admission: RE | Admit: 2020-10-18 | Discharge: 2020-10-20 | DRG: 470 | Disposition: A | Discharge status: Home Health | Payer: Medicare Other | Attending: Orthopedic Surgery | Admitting: Orthopedic Surgery

## 2020-10-18 ENCOUNTER — Encounter
Admission: RE | Disposition: A | Discharge status: Home Health | Payer: Self-pay | Source: Home / Self Care | Attending: Orthopedic Surgery

## 2020-10-18 ENCOUNTER — Inpatient Hospital Stay: Payer: Medicare Other

## 2020-10-18 ENCOUNTER — Other Ambulatory Visit: Payer: Self-pay

## 2020-10-18 ENCOUNTER — Encounter: Payer: Self-pay | Admitting: Orthopedic Surgery

## 2020-10-18 DIAGNOSIS — J3089 Other allergic rhinitis: Secondary | ICD-10-CM | POA: Diagnosis present

## 2020-10-18 DIAGNOSIS — Z79899 Other long term (current) drug therapy: Secondary | ICD-10-CM

## 2020-10-18 DIAGNOSIS — K219 Gastro-esophageal reflux disease without esophagitis: Secondary | ICD-10-CM | POA: Diagnosis present

## 2020-10-18 DIAGNOSIS — Z7982 Long term (current) use of aspirin: Secondary | ICD-10-CM

## 2020-10-18 DIAGNOSIS — D631 Anemia in chronic kidney disease: Secondary | ICD-10-CM | POA: Diagnosis present

## 2020-10-18 DIAGNOSIS — J849 Interstitial pulmonary disease, unspecified: Secondary | ICD-10-CM | POA: Diagnosis present

## 2020-10-18 DIAGNOSIS — I129 Hypertensive chronic kidney disease with stage 1 through stage 4 chronic kidney disease, or unspecified chronic kidney disease: Secondary | ICD-10-CM | POA: Diagnosis present

## 2020-10-18 DIAGNOSIS — E785 Hyperlipidemia, unspecified: Secondary | ICD-10-CM | POA: Diagnosis present

## 2020-10-18 DIAGNOSIS — D869 Sarcoidosis, unspecified: Secondary | ICD-10-CM | POA: Diagnosis present

## 2020-10-18 DIAGNOSIS — Z6828 Body mass index (BMI) 28.0-28.9, adult: Secondary | ICD-10-CM

## 2020-10-18 DIAGNOSIS — I428 Other cardiomyopathies: Secondary | ICD-10-CM | POA: Diagnosis present

## 2020-10-18 DIAGNOSIS — I872 Venous insufficiency (chronic) (peripheral): Secondary | ICD-10-CM | POA: Diagnosis present

## 2020-10-18 DIAGNOSIS — D573 Sickle-cell trait: Secondary | ICD-10-CM | POA: Diagnosis present

## 2020-10-18 DIAGNOSIS — E669 Obesity, unspecified: Secondary | ICD-10-CM | POA: Diagnosis present

## 2020-10-18 DIAGNOSIS — Z8616 Personal history of COVID-19: Secondary | ICD-10-CM | POA: Diagnosis not present

## 2020-10-18 DIAGNOSIS — G4733 Obstructive sleep apnea (adult) (pediatric): Secondary | ICD-10-CM | POA: Diagnosis present

## 2020-10-18 DIAGNOSIS — N2581 Secondary hyperparathyroidism of renal origin: Secondary | ICD-10-CM | POA: Diagnosis present

## 2020-10-18 DIAGNOSIS — Z96659 Presence of unspecified artificial knee joint: Secondary | ICD-10-CM

## 2020-10-18 DIAGNOSIS — Z9884 Bariatric surgery status: Secondary | ICD-10-CM

## 2020-10-18 DIAGNOSIS — I429 Cardiomyopathy, unspecified: Secondary | ICD-10-CM | POA: Diagnosis present

## 2020-10-18 DIAGNOSIS — M1711 Unilateral primary osteoarthritis, right knee: Principal | ICD-10-CM | POA: Diagnosis present

## 2020-10-18 DIAGNOSIS — Z8249 Family history of ischemic heart disease and other diseases of the circulatory system: Secondary | ICD-10-CM | POA: Diagnosis not present

## 2020-10-18 DIAGNOSIS — Z7722 Contact with and (suspected) exposure to environmental tobacco smoke (acute) (chronic): Secondary | ICD-10-CM | POA: Diagnosis present

## 2020-10-18 DIAGNOSIS — I700 Atherosclerosis of aorta: Secondary | ICD-10-CM | POA: Diagnosis present

## 2020-10-18 DIAGNOSIS — Z9889 Other specified postprocedural states: Secondary | ICD-10-CM | POA: Diagnosis not present

## 2020-10-18 DIAGNOSIS — M25761 Osteophyte, right knee: Secondary | ICD-10-CM | POA: Diagnosis present

## 2020-10-18 DIAGNOSIS — Z471 Aftercare following joint replacement surgery: Secondary | ICD-10-CM | POA: Diagnosis not present

## 2020-10-18 DIAGNOSIS — Z20822 Contact with and (suspected) exposure to covid-19: Secondary | ICD-10-CM | POA: Diagnosis present

## 2020-10-18 DIAGNOSIS — N1832 Chronic kidney disease, stage 3b: Secondary | ICD-10-CM | POA: Diagnosis present

## 2020-10-18 DIAGNOSIS — Z91013 Allergy to seafood: Secondary | ICD-10-CM

## 2020-10-18 DIAGNOSIS — Z91041 Radiographic dye allergy status: Secondary | ICD-10-CM

## 2020-10-18 DIAGNOSIS — Z96651 Presence of right artificial knee joint: Secondary | ICD-10-CM | POA: Diagnosis not present

## 2020-10-18 DIAGNOSIS — G2581 Restless legs syndrome: Secondary | ICD-10-CM | POA: Diagnosis present

## 2020-10-18 HISTORY — DX: Chronic kidney disease, stage 3 unspecified: N18.30

## 2020-10-18 HISTORY — DX: Cardiomyopathy, unspecified: I42.9

## 2020-10-18 HISTORY — DX: Hyperlipidemia, unspecified: E78.5

## 2020-10-18 HISTORY — DX: Interstitial pulmonary disease, unspecified: J84.9

## 2020-10-18 HISTORY — DX: Aneurysm of other specified arteries: I72.8

## 2020-10-18 HISTORY — DX: Sarcoidosis, unspecified: D86.9

## 2020-10-18 HISTORY — DX: Bariatric surgery status: Z98.84

## 2020-10-18 HISTORY — DX: Atherosclerosis of aorta: I70.0

## 2020-10-18 HISTORY — PX: KNEE ARTHROPLASTY: SHX992

## 2020-10-18 LAB — POCT I-STAT, CHEM 8
BUN: 23 mg/dL (ref 8–23)
Calcium, Ion: 1.29 mmol/L (ref 1.15–1.40)
Chloride: 106 mmol/L (ref 98–111)
Creatinine, Ser: 1.6 mg/dL — ABNORMAL HIGH (ref 0.44–1.00)
Glucose, Bld: 54 mg/dL — ABNORMAL LOW (ref 70–99)
HCT: 34 % — ABNORMAL LOW (ref 36.0–46.0)
Hemoglobin: 11.6 g/dL — ABNORMAL LOW (ref 12.0–15.0)
Potassium: 3.5 mmol/L (ref 3.5–5.1)
Sodium: 143 mmol/L (ref 135–145)
TCO2: 25 mmol/L (ref 22–32)

## 2020-10-18 LAB — ABO/RH: ABO/RH(D): AB POS

## 2020-10-18 LAB — GLUCOSE, CAPILLARY
Glucose-Capillary: 64 mg/dL — ABNORMAL LOW (ref 70–99)
Glucose-Capillary: 132 mg/dL — ABNORMAL HIGH (ref 70–99)

## 2020-10-18 SURGERY — ARTHROPLASTY, KNEE, TOTAL, USING IMAGELESS COMPUTER-ASSISTED NAVIGATION
Anesthesia: Choice | Site: Knee | Laterality: Right

## 2020-10-18 MED ORDER — IPRATROPIUM-ALBUTEROL 0.5-2.5 (3) MG/3ML IN SOLN: 3.0000 mL | Freq: Four times a day (QID) | RESPIRATORY_TRACT | Status: DC | PRN

## 2020-10-18 MED ORDER — MAGNESIUM OXIDE 400 MG PO TABS
400.0000 mg | ORAL_TABLET | Freq: Every day | ORAL | Status: DC
  Administered 2020-10-18 – 2020-10-20 (×3): 400 mg via ORAL
  Filled 2020-10-18 (×5): qty 1

## 2020-10-18 MED ORDER — ADULT MULTIVITAMIN W/MINERALS CH
1.0000 | ORAL_TABLET | Freq: Every day | ORAL | Status: DC
  Administered 2020-10-19 – 2020-10-20 (×2): 1 via ORAL
  Filled 2020-10-18 (×2): qty 1

## 2020-10-18 MED ORDER — LACTATED RINGERS IV SOLN: INTRAVENOUS | Status: DC

## 2020-10-18 MED ORDER — DIPHENHYDRAMINE HCL 12.5 MG/5ML PO ELIX: 12.5000 mg | ORAL_SOLUTION | ORAL | Status: DC | PRN

## 2020-10-18 MED ORDER — PHENOL 1.4 % MT LIQD
1.0000 | OROMUCOSAL | Status: DC | PRN
  Filled 2020-10-18: qty 177

## 2020-10-18 MED ORDER — CELECOXIB 200 MG PO CAPS
ORAL_CAPSULE | ORAL | Status: AC
  Administered 2020-10-18: 400 mg via ORAL
  Filled 2020-10-18: qty 2

## 2020-10-18 MED ORDER — ORAL CARE MOUTH RINSE: 15.0000 mL | Freq: Once | OROMUCOSAL | Status: AC

## 2020-10-18 MED ORDER — SURGIPHOR WOUND IRRIGATION SYSTEM - OPTIME
TOPICAL | Status: DC | PRN
  Administered 2020-10-18: 1

## 2020-10-18 MED ORDER — ACETAMINOPHEN 10 MG/ML IV SOLN
INTRAVENOUS | Status: DC | PRN
  Administered 2020-10-18: 1000 mg via INTRAVENOUS

## 2020-10-18 MED ORDER — HYDROMORPHONE HCL 1 MG/ML IJ SOLN
0.5000 mg | INTRAMUSCULAR | Status: DC | PRN
  Administered 2020-10-18: 1 mg via INTRAVENOUS
  Filled 2020-10-18: qty 1

## 2020-10-18 MED ORDER — SENNOSIDES-DOCUSATE SODIUM 8.6-50 MG PO TABS: 1.0000 | ORAL_TABLET | Freq: Two times a day (BID) | ORAL | Status: DC

## 2020-10-18 MED ORDER — BUPIVACAINE HCL (PF) 0.25 % IJ SOLN
INTRAMUSCULAR | Status: DC | PRN
  Administered 2020-10-18: 60 mL

## 2020-10-18 MED ORDER — FAMOTIDINE 20 MG PO TABS
ORAL_TABLET | ORAL | Status: AC
  Administered 2020-10-18: 20 mg via ORAL
  Filled 2020-10-18: qty 1

## 2020-10-18 MED ORDER — MAGNESIUM HYDROXIDE 400 MG/5ML PO SUSP
30.0000 mL | Freq: Every day | ORAL | Status: DC
  Administered 2020-10-19: 30 mL via ORAL
  Filled 2020-10-18: qty 30

## 2020-10-18 MED ORDER — ONDANSETRON HCL 4 MG PO TABS: 4.0000 mg | ORAL_TABLET | Freq: Four times a day (QID) | ORAL | Status: DC | PRN

## 2020-10-18 MED ORDER — ONDANSETRON HCL 4 MG/2ML IJ SOLN: 4.0000 mg | Freq: Once | INTRAMUSCULAR | Status: DC | PRN

## 2020-10-18 MED ORDER — PROPOFOL 500 MG/50ML IV EMUL
INTRAVENOUS | Status: DC | PRN
  Administered 2020-10-18: 75 ug/kg/min via INTRAVENOUS

## 2020-10-18 MED ORDER — ASCORBIC ACID 500 MG PO TABS
1000.0000 mg | ORAL_TABLET | Freq: Every day | ORAL | Status: DC
Start: 2020-10-18 — End: 2020-10-21
  Administered 2020-10-19 – 2020-10-20 (×2): 1000 mg via ORAL
  Filled 2020-10-18 (×3): qty 2

## 2020-10-18 MED ORDER — OXYCODONE HCL 5 MG/5ML PO SOLN: 5.0000 mg | Freq: Once | ORAL | Status: DC | PRN

## 2020-10-18 MED ORDER — AZELASTINE HCL 0.1 % NA SOLN
2.0000 | Freq: Every day | NASAL | Status: DC
  Administered 2020-10-19 – 2020-10-20 (×2): 2 via NASAL
  Filled 2020-10-18: qty 30

## 2020-10-18 MED ORDER — TRANEXAMIC ACID-NACL 1000-0.7 MG/100ML-% IV SOLN
INTRAVENOUS | Status: AC
  Filled 2020-10-18: qty 100

## 2020-10-18 MED ORDER — BUDESONIDE 0.5 MG/2ML IN SUSP
0.5000 mg | Freq: Two times a day (BID) | RESPIRATORY_TRACT | Status: DC
  Filled 2020-10-18 (×4): qty 2

## 2020-10-18 MED ORDER — DEXAMETHASONE SODIUM PHOSPHATE 10 MG/ML IJ SOLN
INTRAMUSCULAR | Status: AC
  Administered 2020-10-18: 8 mg via INTRAVENOUS
  Filled 2020-10-18: qty 1

## 2020-10-18 MED ORDER — BISACODYL 10 MG RE SUPP: 10.0000 mg | Freq: Every day | RECTAL | Status: DC | PRN

## 2020-10-18 MED ORDER — GABAPENTIN 300 MG PO CAPS
ORAL_CAPSULE | ORAL | Status: AC
  Administered 2020-10-18: 300 mg via ORAL
  Filled 2020-10-18: qty 1

## 2020-10-18 MED ORDER — CEFAZOLIN SODIUM-DEXTROSE 2-4 GM/100ML-% IV SOLN
INTRAVENOUS | Status: AC
  Filled 2020-10-18: qty 100

## 2020-10-18 MED ORDER — FENTANYL CITRATE (PF) 100 MCG/2ML IJ SOLN
INTRAMUSCULAR | Status: AC
  Filled 2020-10-18: qty 2

## 2020-10-18 MED ORDER — ONDANSETRON HCL 4 MG/2ML IJ SOLN
4.0000 mg | Freq: Four times a day (QID) | INTRAMUSCULAR | Status: DC | PRN
  Administered 2020-10-19: 4 mg via INTRAVENOUS
  Filled 2020-10-18: qty 2

## 2020-10-18 MED ORDER — CHLORHEXIDINE GLUCONATE 0.12 % MT SOLN: 15.0000 mL | Freq: Once | OROMUCOSAL | Status: AC

## 2020-10-18 MED ORDER — GABAPENTIN 300 MG PO CAPS: 300.0000 mg | ORAL_CAPSULE | Freq: Once | ORAL | Status: AC

## 2020-10-18 MED ORDER — ALUM & MAG HYDROXIDE-SIMETH 200-200-20 MG/5ML PO SUSP: 30.0000 mL | ORAL | Status: DC | PRN

## 2020-10-18 MED ORDER — CELECOXIB 200 MG PO CAPS
200.0000 mg | ORAL_CAPSULE | Freq: Two times a day (BID) | ORAL | Status: DC
  Administered 2020-10-18 – 2020-10-20 (×4): 200 mg via ORAL
  Filled 2020-10-18 (×4): qty 1

## 2020-10-18 MED ORDER — CHLORHEXIDINE GLUCONATE 0.12 % MT SOLN
OROMUCOSAL | Status: AC
  Administered 2020-10-18: 15 mL via OROMUCOSAL
  Filled 2020-10-18: qty 15

## 2020-10-18 MED ORDER — MENTHOL 3 MG MT LOZG
1.0000 | LOZENGE | OROMUCOSAL | Status: DC | PRN
  Filled 2020-10-18: qty 9

## 2020-10-18 MED ORDER — ACETAMINOPHEN 10 MG/ML IV SOLN: 1000.0000 mg | Freq: Once | INTRAVENOUS | Status: DC | PRN

## 2020-10-18 MED ORDER — FERROUS SULFATE 325 (65 FE) MG PO TABS
325.0000 mg | ORAL_TABLET | Freq: Every day | ORAL | Status: DC
  Administered 2020-10-19 – 2020-10-20 (×2): 325 mg via ORAL
  Filled 2020-10-18 (×2): qty 1

## 2020-10-18 MED ORDER — BUPIVACAINE HCL (PF) 0.25 % IJ SOLN
INTRAMUSCULAR | Status: AC
  Filled 2020-10-18: qty 30

## 2020-10-18 MED ORDER — LINACLOTIDE 72 MCG PO CAPS
72.0000 ug | ORAL_CAPSULE | Freq: Every day | ORAL | Status: DC
  Administered 2020-10-19 – 2020-10-20 (×2): 72 ug via ORAL
  Filled 2020-10-18 (×3): qty 1

## 2020-10-18 MED ORDER — ENOXAPARIN SODIUM 40 MG/0.4ML ~~LOC~~ SOLN
40.0000 mg | SUBCUTANEOUS | Status: DC
  Administered 2020-10-19 – 2020-10-20 (×2): 40 mg via SUBCUTANEOUS
  Filled 2020-10-18 (×2): qty 0.4

## 2020-10-18 MED ORDER — FENTANYL CITRATE (PF) 100 MCG/2ML IJ SOLN
25.0000 ug | INTRAMUSCULAR | Status: DC | PRN
Start: 2020-10-18 — End: 2020-10-18

## 2020-10-18 MED ORDER — FENTANYL CITRATE (PF) 100 MCG/2ML IJ SOLN
INTRAMUSCULAR | Status: DC | PRN
  Administered 2020-10-18: 50 ug via INTRAVENOUS

## 2020-10-18 MED ORDER — DEXAMETHASONE SODIUM PHOSPHATE 10 MG/ML IJ SOLN: 8.0000 mg | Freq: Once | INTRAMUSCULAR | Status: AC

## 2020-10-18 MED ORDER — TRANEXAMIC ACID-NACL 1000-0.7 MG/100ML-% IV SOLN
INTRAVENOUS | Status: AC
  Administered 2020-10-18: 1000 mg via INTRAVENOUS
  Filled 2020-10-18: qty 100

## 2020-10-18 MED ORDER — LOSARTAN POTASSIUM 25 MG PO TABS
25.0000 mg | ORAL_TABLET | Freq: Every day | ORAL | Status: DC
Start: 2020-10-18 — End: 2020-10-21
  Administered 2020-10-19 – 2020-10-20 (×2): 25 mg via ORAL
  Filled 2020-10-18 (×2): qty 1

## 2020-10-18 MED ORDER — METOCLOPRAMIDE HCL 10 MG PO TABS
10.0000 mg | ORAL_TABLET | Freq: Three times a day (TID) | ORAL | Status: DC
  Administered 2020-10-19 – 2020-10-20 (×7): 10 mg via ORAL
  Filled 2020-10-18 (×8): qty 1

## 2020-10-18 MED ORDER — GABAPENTIN 100 MG PO CAPS: 100.0000 mg | ORAL_CAPSULE | Freq: Every evening | ORAL | Status: DC | PRN

## 2020-10-18 MED ORDER — SODIUM CHLORIDE 0.9 % IV SOLN: INTRAVENOUS | Status: DC

## 2020-10-18 MED ORDER — CARBAMAZEPINE 200 MG PO TABS: 200.0000 mg | ORAL_TABLET | Freq: Three times a day (TID) | ORAL | Status: DC | PRN

## 2020-10-18 MED ORDER — PROPOFOL 500 MG/50ML IV EMUL
INTRAVENOUS | Status: AC
  Filled 2020-10-18: qty 50

## 2020-10-18 MED ORDER — OXYCODONE HCL 5 MG PO TABS
5.0000 mg | ORAL_TABLET | ORAL | Status: DC | PRN
  Administered 2020-10-18: 5 mg via ORAL
  Administered 2020-10-19 (×2): 10 mg via ORAL
  Administered 2020-10-19: 5 mg via ORAL
  Administered 2020-10-20: 10 mg via ORAL
  Administered 2020-10-20: 5 mg via ORAL
  Administered 2020-10-20: 10 mg via ORAL
  Filled 2020-10-18: qty 2
  Filled 2020-10-18 (×2): qty 1
  Filled 2020-10-18: qty 2
  Filled 2020-10-18: qty 1
  Filled 2020-10-18 (×2): qty 2

## 2020-10-18 MED ORDER — SODIUM CHLORIDE 0.9 % IV SOLN
INTRAVENOUS | Status: DC | PRN
  Administered 2020-10-18: 60 mL

## 2020-10-18 MED ORDER — CALCIUM CARBONATE ANTACID 500 MG PO CHEW: 500.0000 mg | CHEWABLE_TABLET | Freq: Every day | ORAL | Status: DC | PRN

## 2020-10-18 MED ORDER — ACETAMINOPHEN 10 MG/ML IV SOLN
1000.0000 mg | Freq: Four times a day (QID) | INTRAVENOUS | Status: AC
  Administered 2020-10-18 – 2020-10-19 (×4): 1000 mg via INTRAVENOUS
  Filled 2020-10-18 (×4): qty 100

## 2020-10-18 MED ORDER — FLUTICASONE PROPIONATE 50 MCG/ACT NA SUSP
2.0000 | Freq: Every day | NASAL | Status: DC
  Administered 2020-10-18 – 2020-10-20 (×3): 2 via NASAL
  Filled 2020-10-18: qty 16

## 2020-10-18 MED ORDER — FAMOTIDINE 20 MG PO TABS: 20.0000 mg | ORAL_TABLET | Freq: Once | ORAL | Status: AC

## 2020-10-18 MED ORDER — PANTOPRAZOLE SODIUM 40 MG PO TBEC
40.0000 mg | DELAYED_RELEASE_TABLET | Freq: Two times a day (BID) | ORAL | Status: DC
  Administered 2020-10-19 – 2020-10-20 (×3): 40 mg via ORAL
  Filled 2020-10-18 (×4): qty 1

## 2020-10-18 MED ORDER — CEFAZOLIN SODIUM-DEXTROSE 2-4 GM/100ML-% IV SOLN
2.0000 g | Freq: Four times a day (QID) | INTRAVENOUS | Status: AC
  Administered 2020-10-18 – 2020-10-19 (×2): 2 g via INTRAVENOUS
  Filled 2020-10-18 (×2): qty 100

## 2020-10-18 MED ORDER — OXYCODONE HCL 5 MG PO TABS
5.0000 mg | ORAL_TABLET | Freq: Once | ORAL | Status: DC | PRN
Start: 2020-10-18 — End: 2020-10-18

## 2020-10-18 MED ORDER — TRAMADOL HCL 50 MG PO TABS: 50.0000 mg | ORAL_TABLET | ORAL | Status: DC | PRN

## 2020-10-18 MED ORDER — FLEET ENEMA 7-19 GM/118ML RE ENEM: 1.0000 | ENEMA | Freq: Once | RECTAL | Status: DC | PRN

## 2020-10-18 MED ORDER — ATORVASTATIN CALCIUM 20 MG PO TABS
40.0000 mg | ORAL_TABLET | Freq: Every day | ORAL | Status: DC
  Administered 2020-10-19 – 2020-10-20 (×2): 40 mg via ORAL
  Filled 2020-10-18 (×2): qty 2

## 2020-10-18 MED ORDER — BUPIVACAINE LIPOSOME 1.3 % IJ SUSP
INTRAMUSCULAR | Status: AC
  Filled 2020-10-18: qty 20

## 2020-10-18 MED ORDER — ZINC GLUCONATE 50 MG PO TABS
50.0000 mg | ORAL_TABLET | Freq: Every day | ORAL | Status: DC
Start: 2020-10-18 — End: 2020-10-18

## 2020-10-18 MED ORDER — TRANEXAMIC ACID-NACL 1000-0.7 MG/100ML-% IV SOLN
1000.0000 mg | INTRAVENOUS | Status: AC
  Administered 2020-10-18: 1000 mg via INTRAVENOUS

## 2020-10-18 MED ORDER — ACETAMINOPHEN 10 MG/ML IV SOLN
INTRAVENOUS | Status: AC
  Filled 2020-10-18: qty 100

## 2020-10-18 MED ORDER — CELECOXIB 200 MG PO CAPS: 400.0000 mg | ORAL_CAPSULE | Freq: Once | ORAL | Status: AC

## 2020-10-18 MED ORDER — ZINC SULFATE 220 (50 ZN) MG PO CAPS
220.0000 mg | ORAL_CAPSULE | Freq: Every day | ORAL | Status: DC
  Administered 2020-10-19 – 2020-10-20 (×2): 220 mg via ORAL
  Filled 2020-10-18 (×2): qty 1

## 2020-10-18 MED ORDER — ALBUTEROL SULFATE HFA 108 (90 BASE) MCG/ACT IN AERS
2.0000 | INHALATION_SPRAY | Freq: Four times a day (QID) | RESPIRATORY_TRACT | Status: DC | PRN
  Filled 2020-10-18: qty 6.7

## 2020-10-18 MED ORDER — LORATADINE 10 MG PO TABS
10.0000 mg | ORAL_TABLET | Freq: Every day | ORAL | Status: DC
  Administered 2020-10-19 – 2020-10-20 (×2): 10 mg via ORAL
  Filled 2020-10-18 (×2): qty 1

## 2020-10-18 MED ORDER — TRANEXAMIC ACID-NACL 1000-0.7 MG/100ML-% IV SOLN: 1000.0000 mg | Freq: Once | INTRAVENOUS | Status: AC

## 2020-10-18 MED ORDER — BUPIVACAINE HCL (PF) 0.5 % IJ SOLN
INTRAMUSCULAR | Status: DC | PRN
  Administered 2020-10-18: 3 mL

## 2020-10-18 MED ORDER — ACETAMINOPHEN 325 MG PO TABS: 325.0000 mg | ORAL_TABLET | Freq: Four times a day (QID) | ORAL | Status: DC | PRN

## 2020-10-18 MED ORDER — CHLORHEXIDINE GLUCONATE 4 % EX LIQD: 60.0000 mL | Freq: Once | CUTANEOUS | Status: DC

## 2020-10-18 MED ORDER — CEFAZOLIN SODIUM-DEXTROSE 2-4 GM/100ML-% IV SOLN
2.0000 g | INTRAVENOUS | Status: AC
  Administered 2020-10-18: 2 g via INTRAVENOUS

## 2020-10-18 SURGICAL SUPPLY — 77 items
ATTUNE MED DOME PAT 38 KNEE (Knees) ×1 IMPLANT
ATTUNE PS FEM RT SZ 6 CEM KNEE (Femur) ×1 IMPLANT
ATTUNE PSRP INSR SZ6 5 KNEE (Insert) ×1 IMPLANT
BASE TIBIA ATTUNE KNEE SYS SZ6 (Knees) IMPLANT
BATTERY INSTRU NAVIGATION (MISCELLANEOUS) ×8 IMPLANT
BLADE SAW 70X12.5 (BLADE) ×2 IMPLANT
BLADE SAW 90X13X1.19 OSCILLAT (BLADE) ×2 IMPLANT
BLADE SAW 90X25X1.19 OSCILLAT (BLADE) ×2 IMPLANT
CANISTER PREVENA PLUS 150 (CANNISTER) ×2 IMPLANT
CANISTER SUCT 3000ML PPV (MISCELLANEOUS) ×2 IMPLANT
CEMENT HV SMART SET (Cement) ×2 IMPLANT
COOLER POLAR GLACIER W/PUMP (MISCELLANEOUS) ×2 IMPLANT
COVER WAND RF STERILE (DRAPES) ×2 IMPLANT
CUFF TOURN SGL QUICK 24 (TOURNIQUET CUFF)
CUFF TOURN SGL QUICK 30 (TOURNIQUET CUFF)
CUFF TRNQT CYL 24X4X16.5-23 (TOURNIQUET CUFF) IMPLANT
CUFF TRNQT CYL 30X4X21-28X (TOURNIQUET CUFF) IMPLANT
DRAPE 3/4 80X56 (DRAPES) ×2 IMPLANT
DRESSING PEEL AND PLAC PRVNA20 (GAUZE/BANDAGES/DRESSINGS) ×1 IMPLANT
DRSG DERMACEA 8X12 NADH (GAUZE/BANDAGES/DRESSINGS) ×2 IMPLANT
DRSG MEPILEX SACRM 8.7X9.8 (GAUZE/BANDAGES/DRESSINGS) ×2 IMPLANT
DRSG OPSITE POSTOP 4X14 (GAUZE/BANDAGES/DRESSINGS) ×2 IMPLANT
DRSG PEEL AND PLACE PREVENA 20 (GAUZE/BANDAGES/DRESSINGS) ×2
DRSG TEGADERM 4X4.75 (GAUZE/BANDAGES/DRESSINGS) ×2 IMPLANT
DURAPREP 26ML APPLICATOR (WOUND CARE) ×4 IMPLANT
ELECT REM PT RETURN 9FT ADLT (ELECTROSURGICAL) ×2
ELECTRODE REM PT RTRN 9FT ADLT (ELECTROSURGICAL) ×1 IMPLANT
EX-PIN ORTHOLOCK NAV 4X150 (PIN) ×4 IMPLANT
GLOVE BIOGEL PI IND STRL 7.5 (GLOVE) ×1 IMPLANT
GLOVE BIOGEL PI INDICATOR 7.5 (GLOVE) ×1
GLOVE INDICATOR 8.0 STRL GRN (GLOVE) ×2 IMPLANT
GLOVE SURG ENC MOIS LTX SZ7.5 (GLOVE) ×4 IMPLANT
GLOVE SURG ENC TEXT LTX SZ7.5 (GLOVE) ×4 IMPLANT
GOWN STRL REUS W/ TWL LRG LVL3 (GOWN DISPOSABLE) ×2 IMPLANT
GOWN STRL REUS W/ TWL XL LVL3 (GOWN DISPOSABLE) ×1 IMPLANT
GOWN STRL REUS W/TWL LRG LVL3 (GOWN DISPOSABLE) ×4
GOWN STRL REUS W/TWL XL LVL3 (GOWN DISPOSABLE) ×2
HEMOVAC 400CC 10FR (MISCELLANEOUS) ×2 IMPLANT
HOLDER FOLEY CATH W/STRAP (MISCELLANEOUS) ×2 IMPLANT
HOOD PEEL AWAY FLYTE STAYCOOL (MISCELLANEOUS) ×4 IMPLANT
IRRIGATION SURGIPHOR STRL (IV SOLUTION) ×2 IMPLANT
KIT PUMP PREVENA PLUS 14DAY (MISCELLANEOUS) ×2 IMPLANT
KIT TURNOVER KIT A (KITS) ×2 IMPLANT
KNIFE SCULPS 14X20 (INSTRUMENTS) ×2 IMPLANT
LABEL OR SOLS (LABEL) ×2 IMPLANT
MANIFOLD NEPTUNE II (INSTRUMENTS) ×4 IMPLANT
NDL SAFETY ECLIPSE 18X1.5 (NEEDLE) ×1 IMPLANT
NDL SPNL 20GX3.5 QUINCKE YW (NEEDLE) ×2 IMPLANT
NEEDLE HYPO 18GX1.5 SHARP (NEEDLE) ×2
NEEDLE SPNL 20GX3.5 QUINCKE YW (NEEDLE) ×4 IMPLANT
NS IRRIG 500ML POUR BTL (IV SOLUTION) ×2 IMPLANT
PACK TOTAL KNEE (MISCELLANEOUS) ×2 IMPLANT
PAD WRAPON POLAR KNEE (MISCELLANEOUS) ×1 IMPLANT
PENCIL SMOKE EVACUATOR (MISCELLANEOUS) ×2 IMPLANT
PIN FIXATION 1/8DIA X 3INL (PIN) ×6 IMPLANT
PULSAVAC PLUS IRRIG FAN TIP (DISPOSABLE) ×2
SOL .9 NS 3000ML IRR  AL (IV SOLUTION) ×2
SOL .9 NS 3000ML IRR UROMATIC (IV SOLUTION) ×1 IMPLANT
SOL PREP PVP 2OZ (MISCELLANEOUS) ×2
SOLUTION PREP PVP 2OZ (MISCELLANEOUS) ×1 IMPLANT
SPONGE DRAIN TRACH 4X4 STRL 2S (GAUZE/BANDAGES/DRESSINGS) ×2 IMPLANT
STAPLER SKIN PROX 35W (STAPLE) ×2 IMPLANT
STOCKINETTE IMPERV 14X48 (MISCELLANEOUS) IMPLANT
STRAP TIBIA SHORT (MISCELLANEOUS) ×2 IMPLANT
SUCTION FRAZIER HANDLE 10FR (MISCELLANEOUS) ×2
SUCTION TUBE FRAZIER 10FR DISP (MISCELLANEOUS) ×1 IMPLANT
SUT VIC AB 0 CT1 36 (SUTURE) ×4 IMPLANT
SUT VIC AB 1 CT1 36 (SUTURE) ×4 IMPLANT
SUT VIC AB 2-0 CT2 27 (SUTURE) ×2 IMPLANT
SYR 20ML LL LF (SYRINGE) ×2 IMPLANT
SYR 30ML LL (SYRINGE) ×4 IMPLANT
TIBIA ATTUNE KNEE SYS BASE SZ6 (Knees) ×2 IMPLANT
TIP FAN IRRIG PULSAVAC PLUS (DISPOSABLE) ×1 IMPLANT
TOWEL OR 17X26 4PK STRL BLUE (TOWEL DISPOSABLE) ×2 IMPLANT
TOWER CARTRIDGE SMART MIX (DISPOSABLE) ×2 IMPLANT
TRAY FOLEY MTR SLVR 16FR STAT (SET/KITS/TRAYS/PACK) ×2 IMPLANT
WRAPON POLAR PAD KNEE (MISCELLANEOUS) ×2

## 2020-10-18 NOTE — Plan of Care (Signed)

## 2020-10-18 NOTE — H&P (Signed)
The patient has been re-examined, and the chart reviewed, and there have been no interval changes to the documented history and physical.    The risks, benefits, and alternatives have been discussed at length. The patient expressed understanding of the risks benefits and agreed with plans for surgical intervention.  Gowri Suchan P. Alexzavier Girardin, Jr. M.D.    

## 2020-10-18 NOTE — Transfer of Care (Signed)
Immediate Anesthesia Transfer of Care Note  Patient: Melissa [name]  Procedure(s) Performed: COMPUTER ASSISTED TOTAL KNEE ARTHROPLASTY (Right Knee)  Patient Location: PACU  Anesthesia Type:Spinal  Level of Consciousness: awake, alert  and oriented  Airway & Oxygen Therapy: Patient Spontanous Breathing  Post-op Assessment: Report given to RN and Post -op Vital signs reviewed and stable  Post vital signs: Reviewed and stable  Last Vitals:  Vitals Value Taken Time  BP 124/82 10/18/20 1631  Temp    Pulse 89 10/18/20 1635  Resp 19 10/18/20 1635  SpO2 100 % 10/18/20 1635  Vitals shown include unvalidated device data.  Last Pain:  Vitals:   10/18/20 1113  TempSrc: Temporal  PainSc: 3          Complications: No complications documented.

## 2020-10-18 NOTE — Anesthesia Postprocedure Evaluation (Signed)
Anesthesia Post Note  Patient: Melissa [name]  Procedure(s) Performed: COMPUTER ASSISTED TOTAL KNEE ARTHROPLASTY (Right Knee)  Patient location during evaluation: PACU Anesthesia Type: Combined General/Spinal Level of consciousness: awake and alert Pain management: pain level controlled Vital Signs Assessment: post-procedure vital signs reviewed and stable Respiratory status: spontaneous breathing, nonlabored ventilation, respiratory function stable and patient connected to nasal cannula oxygen Cardiovascular status: blood pressure returned to baseline and stable Postop Assessment: no apparent nausea or vomiting Anesthetic complications: no   No complications documented.   Last Vitals:  Vitals:   10/18/20 1700 10/18/20 1715  BP: (!) 144/91 (!) 144/95  Pulse: 77 74  Resp: 12 10  Temp:    SpO2: 100% 100%    Last Pain:  Vitals:   10/18/20 1715  TempSrc:   PainSc: 0-No pain                 Molli Barrows

## 2020-10-18 NOTE — Op Note (Signed)
OPERATIVE NOTE  DATE OF SURGERY:  10/18/2020  PATIENT NAME:  ANIHYA TUMA   DOB: 1951/05/18  MRN: [id number]  PRE-OPERATIVE DIAGNOSIS: Degenerative arthrosis of the right knee, primary  POST-OPERATIVE DIAGNOSIS:  Same  PROCEDURE:  Right total knee arthroplasty using computer-assisted navigation  SURGEON:  Marciano Sequin. M.D.  ASSISTANT: Cassell Smiles, [name] (present and scrubbed throughout the case, critical for assistance with exposure, retraction, instrumentation, and closure)  ANESTHESIA: spinal  ESTIMATED BLOOD LOSS: 50 mL  FLUIDS REPLACED: 1000 mL of crystalloid  TOURNIQUET TIME: 98 minutes  DRAINS: 2 medium Hemovac drains  SOFT TISSUE RELEASES: Anterior cruciate ligament, posterior cruciate ligament, deep medial collateral ligament, patellofemoral ligament, and posterolateral corner  IMPLANTS UTILIZED: DePuy Attune size 6 posterior stabilized femoral component (cemented), size 6 rotating platform tibial component (cemented), 38 mm medialized dome patella (cemented), and a 5 mm stabilized rotating platform polyethylene insert.  INDICATIONS FOR SURGERY: WANETTE ROBISON is a 70 y.o. year old female with a long history of progressive knee pain. X-rays demonstrated severe degenerative changes in tricompartmental fashion. The patient had not seen any significant improvement despite conservative nonsurgical intervention. After discussion of the risks and benefits of surgical intervention, the patient expressed understanding of the risks benefits and agree with plans for total knee arthroplasty.   The risks, benefits, and alternatives were discussed at length including but not limited to the risks of infection, bleeding, nerve injury, stiffness, blood clots, the need for revision surgery, cardiopulmonary complications, among others, and they were willing to proceed.  PROCEDURE IN DETAIL: The patient was brought into the operating room and, after adequate spinal anesthesia was  achieved, a tourniquet was placed on the patient's upper thigh. The patient's knee and leg were cleaned and prepped with alcohol and DuraPrep and draped in the usual sterile fashion. A "timeout" was performed as per usual protocol. The lower extremity was exsanguinated using an Esmarch, and the tourniquet was inflated to 300 mmHg. An anterior longitudinal incision was made followed by a standard mid vastus approach. The deep fibers of the medial collateral ligament were elevated in a subperiosteal fashion off of the medial flare of the tibia so as to maintain a continuous soft tissue sleeve. The patella was subluxed laterally and the patellofemoral ligament was incised. Inspection of the knee demonstrated severe degenerative changes with full-thickness loss of articular cartilage. Osteophytes were debrided using a rongeur. Anterior and posterior cruciate ligaments were excised. Two 4.0 mm Schanz pins were inserted in the femur and into the tibia for attachment of the array of trackers used for computer-assisted navigation. Hip center was identified using a circumduction technique. Distal landmarks were mapped using the computer. The distal femur and proximal tibia were mapped using the computer. The distal femoral cutting guide was positioned using computer-assisted navigation so as to achieve a 5 distal valgus cut. The femur was sized and it was felt that a size 6 femoral component was appropriate. A size 6 femoral cutting guide was positioned and the anterior cut was performed and verified using the computer. This was followed by completion of the posterior and chamfer cuts. Femoral cutting guide for the central box was then positioned in the center box cut was performed.  Attention was then directed to the proximal tibia. Medial and lateral menisci were excised. The extramedullary tibial cutting guide was positioned using computer-assisted navigation so as to achieve a 0 varus-valgus alignment and 3  posterior slope. The cut was performed and verified using the computer. The  proximal tibia was sized and it was felt that a size 6 tibial tray was appropriate. Tibial and femoral trials were inserted followed by insertion of a 5 mm polyethylene insert. The knee was felt to be tight laterally.  The trial components were removed and the knee was brought into full extension and distracted using the Moreland retractors.  The posterolateral corner was carefully released using a combination of electrocautery and Metzenbaum scissors.  Trial components were reinserted followed by placement of a 5 mm polyethylene trial.  This allowed for excellent mediolateral soft tissue balancing both in flexion and in full extension. Finally, the patella was cut and prepared so as to accommodate a 38 mm medialized dome patella. A patella trial was placed and the knee was placed through a range of motion with excellent patellar tracking appreciated. The femoral trial was removed after debridement of posterior osteophytes. The central post-hole for the tibial component was reamed followed by insertion of a keel punch. Tibial trials were then removed. Cut surfaces of bone were irrigated with copious amounts of normal saline using pulsatile lavage and then suctioned dry. Polymethylmethacrylate cement was prepared in the usual fashion using a vacuum mixer. Cement was applied to the cut surface of the proximal tibia as well as along the undersurface of a size 6 rotating platform tibial component. Tibial component was positioned and impacted into place. Excess cement was removed using [employer] fast streamer. Cement was then applied to the cut surfaces of the femur as well as along the posterior flanges of the size 6 femoral component. The femoral component was positioned and impacted into place. Excess cement was removed using [employer] fast streamer. A 5 mm polyethylene trial was inserted and the knee was brought into full extension with steady axial  compression applied. Finally, cement was applied to the backside of a 38 mm medialized dome patella and the patellar component was positioned and patellar clamp applied. Excess cement was removed using [employer] fast streamer. After adequate curing of the cement, the tourniquet was deflated after a total tourniquet time of 98 minutes. Hemostasis was achieved using electrocautery. The knee was irrigated with copious amounts of normal saline using pulsatile lavage followed by 500 ml of Surgiphor and then suctioned dry. 20 mL of 1.3% Exparel and 60 mL of 0.25% Marcaine in 40 mL of normal saline was injected along the posterior capsule, medial and lateral gutters, and along the arthrotomy site. A 5 mm stabilized rotating platform polyethylene insert was inserted and the knee was placed through a range of motion with excellent mediolateral soft tissue balancing appreciated and excellent patellar tracking noted. 2 medium drains were placed in the wound bed and brought out through separate stab incisions. The medial parapatellar portion of the incision was reapproximated using interrupted sutures of #1 Vicryl. Subcutaneous tissue was approximated in layers using first #0 Vicryl followed #2-0 Vicryl. The skin was approximated with skin staples. A sterile dressing was applied.  The patient tolerated the procedure well and was transported to the recovery room in stable condition.    Janaiya Beauchesne P. Holley Bouche., M.D.

## 2020-10-18 NOTE — Anesthesia Procedure Notes (Signed)
Spinal  Patient location during procedure: OR Staffing Performed: resident/CRNA  Anesthesiologist: Arita Miss, MD Resident/CRNA: Fredderick Phenix, CRNA Preanesthetic Checklist Completed: patient identified, IV checked, site marked, risks and benefits discussed, surgical consent, monitors and equipment checked, pre-op evaluation and timeout performed Spinal Block Patient position: sitting Prep: DuraPrep Patient monitoring: heart rate, cardiac monitor, continuous pulse ox and blood pressure Approach: midline Location: L3-4 Injection technique: single-shot Needle Needle type: Sprotte  Needle gauge: 24 G Needle length: 9 cm Assessment Sensory level: T4

## 2020-10-18 NOTE — Anesthesia Preprocedure Evaluation (Addendum)
Anesthesia Evaluation  Patient identified by MRN, date of birth, ID band Patient awake    Reviewed: Allergy & Precautions, NPO status , Patient's Chart, lab work & pertinent test results  History of Anesthesia Complications Negative for: history of anesthetic complications  Airway Mallampati: II  TM Distance: >3 FB Neck ROM: Full    Dental  (+) Teeth Intact, Implants   Pulmonary sleep apnea and Continuous Positive Airway Pressure Ventilation , pneumonia, resolved, neg COPD, Patient abstained from smoking.Not current smoker,  covid in 07/2020. Residual dry occasional cough.   Pulmonary exam normal breath sounds clear to auscultation       Cardiovascular Exercise Tolerance: Good METShypertension, (-) CAD and (-) Past MI (-) dysrhythmias  Rhythm:Regular Rate:Normal - Systolic murmurs TTE 0/2542: 1. Left ventricular ejection fraction, by estimation, is 55 to 60%. The  left ventricle has normal function. Left ventricular endocardial border  not optimally defined to evaluate regional wall motion. Left ventricular  diastolic parameters were normal.  2. Right ventricular systolic function is normal. The right ventricular  size is normal. There is normal pulmonary artery systolic pressure.  3. The mitral valve is normal in structure. No evidence of mitral valve  regurgitation. No evidence of mitral stenosis.  4. The aortic valve is normal in structure. Aortic valve regurgitation is  not visualized. Mild aortic valve sclerosis is present, with no evidence  of aortic valve stenosis.  5. The inferior vena cava is normal in size with greater than 50%  respiratory variability, suggesting right atrial pressure of 3 mmHg.    Neuro/Psych PSYCHIATRIC DISORDERS Depression negative neurological ROS     GI/Hepatic GERD  Medicated,(+)     (-) substance abuse  ,   Endo/Other  neg diabetes  Renal/GU CRFRenal disease      Musculoskeletal  (+) Arthritis ,   Abdominal   Peds  Hematology   Anesthesia Other Findings Past Medical History: No date: Aortic atherosclerosis (HCC) No date: Bruising No date: Cardiomyopathy [facility]) No date: Celiac artery aneurysm (HCC) No date: Cervical radiculopathy No date: CKD (chronic kidney disease), stage III (HCC) No date: Deficiency of vitamin B No date: Depression No date: Edema No date: Flank pain No date: Gross hematuria 05/01/2019: History of 2019 novel coronavirus disease (COVID-19) No date: History of bariatric surgery No date: HLD (hyperlipidemia) No date: HTN (hypertension) No date: Interstitial lung disease (HCC) No date: Iron deficiency anemia No date: Leukopenia No date: OA (osteoarthritis) No date: Obesity No date: OSA on CPAP No date: Osteopenia No date: Perennial allergic rhinitis 2020: Pneumonia due to COVID-19 virus No date: Renal cysts, acquired, bilateral No date: RLS (restless legs syndrome) No date: Sarcoidosis No date: Sickle cell trait (HCC) No date: Snoring No date: Spinal stenosis No date: Trigeminal neuralgia  Reproductive/Obstetrics                            Anesthesia Physical Anesthesia Plan  ASA: II  Anesthesia Plan: General/Spinal   Post-op Pain Management:    Induction: Intravenous  PONV Risk Score and Plan: 3 and Ondansetron, Dexamethasone, Propofol infusion, TIVA and Treatment may vary due to age or medical condition  Airway Management Planned: Natural Airway  Additional Equipment: None  Intra-op Plan:   Post-operative Plan:   Informed Consent: I have reviewed the patients History and Physical, chart, labs and discussed the procedure including the risks, benefits and alternatives for the proposed anesthesia with the patient or authorized representative who has  indicated his/her understanding and acceptance.       Plan Discussed with: CRNA and Surgeon  Anesthesia Plan Comments:  (Discussed R/B/A of neuraxial anesthesia technique with patient: - rare risks of spinal/epidural hematoma, nerve damage, infection - Risk of PDPH - Risk of nausea and vomiting - Risk of conversion to general anesthesia and its associated risks, including sore throat, damage to lips/teeth/oropharynx, and rare risks such as cardiac and respiratory events.  Patient voiced understanding.)        Anesthesia Quick Evaluation

## 2020-10-18 NOTE — Progress Notes (Signed)
I stat checked AM of surgery. K ok, but glucose was 54. Rechecked via finger stick, CBG 64. Dr. Bertell Maria made aware and ok to proceed. No hx of diabetes.

## 2020-10-19 ENCOUNTER — Encounter: Payer: Self-pay | Admitting: Orthopedic Surgery

## 2020-10-19 MED ORDER — DOCUSATE SODIUM 100 MG PO CAPS
100.0000 mg | ORAL_CAPSULE | Freq: Two times a day (BID) | ORAL | Status: DC
  Administered 2020-10-19: 100 mg via ORAL
  Filled 2020-10-19: qty 1

## 2020-10-19 MED ORDER — SENNA 8.6 MG PO TABS
1.0000 | ORAL_TABLET | Freq: Two times a day (BID) | ORAL | Status: DC
  Administered 2020-10-19: 8.6 mg via ORAL

## 2020-10-19 NOTE — Evaluation (Signed)
Physical Therapy Evaluation Patient Details Name: Melissa [name] MRN: [id number] DOB: September 12, 1950 Today's Date: 10/19/2020   History of Present Illness  Patient is s/p R TKA.  Clinical Impression  Patient received in bed, sister present. Patient is very pleasant, talkative during session. She performed bed level exercises and performed bed mobility with mod independence. Transfers with min guard and cues. Ambulated 30 feet with RW and min guard. Cues for sequencing, posture and use of RW. Patient doing well first session and will continue to benefit from skilled PT while here to improve functional mobility, independence, strength and ROM.       Follow Up Recommendations Home health PT    Equipment Recommendations  Rolling walker with 5" wheels    Recommendations for Other Services       Precautions / Restrictions Precautions Precautions: Fall Restrictions Weight Bearing Restrictions: No RLE Weight Bearing: Weight bearing as tolerated      Mobility  Bed Mobility Overal bed mobility: Modified Independent                  Transfers Overall transfer level: Needs assistance Equipment used: Rolling walker (2 wheeled) Transfers: Sit to/from Stand Sit to Stand: Min guard         General transfer comment: Cues for hand placement, increased time and effort  Ambulation/Gait Ambulation/Gait assistance: Min guard Gait Distance (Feet): 30 Feet Assistive device: Rolling walker (2 wheeled) Gait Pattern/deviations: Step-to pattern;Decreased stance time - right;Decreased step length - left;Decreased stride length;Trunk flexed;Antalgic Gait velocity: decr   General Gait Details: patient ambulates with antalgic gait pattern, flexed posture due to back pain. Cues for sequencing and WB status.  Stairs            Wheelchair Mobility    Modified Rankin (Stroke Patients Only)       Balance Overall balance assessment: Needs assistance Sitting-balance support: Feet  supported Sitting balance-Leahy Scale: Good     Standing balance support: Bilateral upper extremity supported;During functional activity Standing balance-Leahy Scale: Fair Standing balance comment: reliant on RW and external assist for safety                             Pertinent Vitals/Pain Pain Assessment: Faces Faces Pain Scale: Hurts little more Pain Location: R knee Pain Descriptors / Indicators: Aching;Guarding;Grimacing;Discomfort Pain Intervention(s): Limited activity within patient's tolerance;Monitored during session;Patient requesting pain meds-RN notified;Repositioned;Premedicated before session;Ice applied    Home Living Family/patient expects to be discharged to:: Private residence Living Arrangements: Children Available Help at Discharge: Family;Available PRN/intermittently Type of Home: House Home Access: Level entry     Home Layout: Two level;Able to live on main level with bedroom/bathroom        Prior Function Level of Independence: Independent               Hand Dominance        Extremity/Trunk Assessment   Upper Extremity Assessment Upper Extremity Assessment: Defer to OT evaluation    Lower Extremity Assessment Lower Extremity Assessment: RLE deficits/detail RLE Deficits / Details: ROM 0-70 degrees AAROM RLE Sensation: WNL RLE Coordination: decreased gross motor    Cervical / Trunk Assessment Cervical / Trunk Assessment: Normal  Communication   Communication: No difficulties  Cognition Arousal/Alertness: Awake/alert Behavior During Therapy: WFL for tasks assessed/performed Overall Cognitive Status: Within Functional Limits for tasks assessed  General Comments      Exercises Total Joint Exercises Ankle Circles/Pumps: AROM;10 reps;Both Heel Slides: AROM;Right;10 reps Hip ABduction/ADduction: AROM;Right;10 reps Straight Leg Raises: AROM;Right;10  reps Goniometric ROM: 0-70   Assessment/Plan    PT Assessment Patient needs continued PT services  PT Problem List Decreased strength;Decreased mobility;Decreased activity tolerance;Decreased balance;Pain;Decreased range of motion       PT Treatment Interventions DME instruction;Therapeutic exercise;Gait training;Balance training;Stair training;Functional mobility training;Therapeutic activities;Patient/family education    PT Goals (Current goals can be found in the Care Plan section)  Acute Rehab PT Goals Patient Stated Goal: to get better PT Goal Formulation: With patient Time For Goal Achievement: 10/26/20 Potential to Achieve Goals: Good    Frequency BID   Barriers to discharge        Co-evaluation               AM-PAC PT "6 Clicks" Mobility  Outcome Measure Help needed turning from your back to your side while in a flat bed without using bedrails?: A Little Help needed moving from lying on your back to sitting on the side of a flat bed without using bedrails?: A Little Help needed moving to and from a bed to a chair (including a wheelchair)?: A Little Help needed standing up from a chair using your arms (e.g., wheelchair or bedside chair)?: A Little Help needed to walk in hospital room?: A Little Help needed climbing 3-5 steps with a railing? : A Little 6 Click Score: 18    End of Session Equipment Utilized During Treatment: Gait belt Activity Tolerance: Patient tolerated treatment well;Patient limited by pain Patient left: in chair;with call bell/phone within reach;with chair alarm set Nurse Communication: Mobility status PT Visit Diagnosis: Muscle weakness (generalized) (M62.81);Difficulty in walking, not elsewhere classified (R26.2);Pain Pain - Right/Left: Right Pain - part of body: Knee    Time: 6144-3154 PT Time Calculation (min) (ACUTE ONLY): 34 min   Charges:   PT Evaluation $PT Eval Moderate Complexity: 1 Mod PT Treatments $Gait Training: 8-22  mins        Kennadi Albany, PT, GCS 10/19/20,9:57 AM

## 2020-10-19 NOTE — Plan of Care (Signed)

## 2020-10-19 NOTE — Progress Notes (Signed)
  Subjective: 1 Day Post-Op Procedure(s) (LRB): COMPUTER ASSISTED TOTAL KNEE ARTHROPLASTY (Right) Patient reports pain as well-controlled.   Patient is well, and has had no acute complaints or problems Plan is to go Home after hospital stay. Negative for chest pain and shortness of breath Fever: no Gastrointestinal: negative for nausea and vomiting.   Patient has not had a bowel movement.  Objective: Vital signs in last 24 hours: Temp:  [97.4 F (36.3 C)-98.7 F (37.1 C)] 97.8 F (36.6 C) (03/03 0753) Pulse Rate:  [58-106] 68 (03/03 0753) Resp:  [10-20] 16 (03/03 0753) BP: (96-144)/(53-95) 122/84 (03/03 0753) SpO2:  [98 %-100 %] 100 % (03/03 0753) Weight:  [95.3 kg] 95.3 kg (03/02 1113)  Intake/Output from previous day:  Intake/Output Summary (Last 24 hours) at 10/19/2020 0907 Last data filed at 10/19/2020 0634 Gross per 24 hour  Intake 2663.73 ml  Output 1010 ml  Net 1653.73 ml    Intake/Output this shift: No intake/output data recorded.  Labs: Recent Labs    10/18/20 1201  HGB 11.6*   Recent Labs    10/18/20 1201  HCT 34.0*   Recent Labs    10/18/20 1201  NA 143  K 3.5  CL 106  BUN 23  CREATININE 1.60*  GLUCOSE 54*   No results for input(s): LABPT, INR in the last 72 hours.   EXAM General - Patient is Alert, Appropriate and Oriented Extremity - Neurovascular intact Dorsiflexion/Plantar flexion intact Compartment soft Dressing/Incision -Postoperative dressing remains in place., Polar Care in place and working. , Hemovac in place.  Motor Function - intact, moving foot and toes well on exam.  Cardiovascular- Regular rate and rhythm, no murmurs/rubs/gallops Respiratory- Lungs clear to auscultation bilaterally Gastrointestinal- soft, nontender and active bowel sounds   Assessment/Plan: 1 Day Post-Op Procedure(s) (LRB): COMPUTER ASSISTED TOTAL KNEE ARTHROPLASTY (Right) Active Problems:   Total knee replacement status  Estimated body mass index is  29.32 kg/m as calculated from the following:   Height as of this encounter: 5\' 11"  (1.803 m).   Weight as of this encounter: 95.3 kg. Advance diet Up with therapy    DVT Prophylaxis - Lovenox, Ted hose and foot pumps Weight-Bearing as tolerated to right leg  Cassell Smiles, [name] [facility] Orthopaedic Surgery 10/19/2020, 9:07 AM

## 2020-10-19 NOTE — Care Management Important Message (Signed)
Important Message  Patient Details  Name: Melissa [name] MRN: [id number] Date of Birth: 01/10/1951   Medicare Important Message Given:  N/A - LOS <3 / Initial given by admissions  Initial Medicare IM reviewed with patient by Meredith Mody, Patient Access Associate on 10/19/2020 at 11:40am.    Dannette Barbara 10/19/2020, 7:24 PM

## 2020-10-19 NOTE — TOC Initial Note (Signed)
Transition of Care [facility]) - Initial/Assessment Note    Patient Details  Name: Melissa [name] MRN: [id number] Date of Birth: [birth year]  [facility]) CM/SW Contact:    Magnus Ivan, LCSW Phone Number: 10/19/2020, 11:09 AM  Clinical Narrative:                CSW met with patient at bedside. Patient lives with her son. She confirmed she wants to use [facility] for HHPT which was arranged by [employer]. Patient reported she needs a RW and 3 in 1. CSW ordered DME through [employer]. Patient reported her sister will provide transportation at time of hospital DC. Denied additional needs at this time.   Expected Discharge Plan: [place] Barriers to Discharge: Continued Medical Work up   Patient Goals and CMS Choice Patient states their goals for this hospitalization and ongoing recovery are:: home with home health CMS Medicare.gov Compare Post Acute Care list provided to:: Patient Choice offered to / list presented to : Patient  Expected Discharge Plan and Services Expected Discharge Plan: [place]       Living arrangements for the past 2 months: Single Family Home                 DME Arranged: Walker rolling DME Agency: [employer] Date DME Agency Contacted: 10/19/20   Representative spoke with at DME Agency: Nash Shearer HH Arranged: PT [place]: [facility] [employer] Date [place]: 10/16/20   Representative spoke with at [place]: Drue Novel  Prior Living Arrangements/Services Living arrangements for the past 2 months: [place] with:: Adult Children Patient language and need for interpreter reviewed:: Yes Do you feel safe going back to the place where you live?: Yes      Need for Family Participation in Patient Care: Yes (Comment) Care giver support system in place?: Yes (comment)   Criminal Activity/Legal Involvement Pertinent to Current  Situation/Hospitalization: No - Comment as needed  Activities of Daily Living Home Assistive Devices/Equipment: CPAP,Eyeglasses,Cane (specify quad or straight),Shower chair without back ADL Screening (condition at time of admission) Patient's cognitive ability adequate to safely complete daily activities?: Yes Is the patient deaf or have difficulty hearing?: No Does the patient have difficulty seeing, even when wearing glasses/contacts?: No Does the patient have difficulty concentrating, remembering, or making decisions?: No Patient able to express need for assistance with ADLs?: Yes Does the patient have difficulty dressing or bathing?: No Independently performs ADLs?: Yes (appropriate for developmental age) Does the patient have difficulty walking or climbing stairs?: Yes Weakness of Legs: Both Weakness of Arms/Hands: None  Permission Sought/Granted Permission sought to share information with : Chartered certified accountant granted to share information with : Yes, Verbal Permission Granted  Share Information with NAME: son, sister  Permission granted to share info w AGENCY: HH, DME agencies        Emotional Assessment       Orientation: : Oriented to Self,Oriented to Place,Oriented to  Time,Oriented to Situation Alcohol / Substance Use: Not Applicable Psych Involvement: No (comment)  Admission diagnosis:  Total knee replacement status [Z96.659] Patient Active Problem List   Diagnosis Date Noted  . Total knee replacement status 10/18/2020  . Primary osteoarthritis of right hip 08/06/2020  . Sarcoidosis 05/29/2020  . Aneurysm of celiac artery ([place]) 02/11/2020  . COVID-19 virus infection 09/06/2019  . Localized swelling of right lower leg 09/06/2019  .  OSA on CPAP 09/06/2019  . Secondary hyperparathyroidism of renal origin ([place]) 07/21/2019  . Anemia in chronic kidney disease 06/14/2019  . Benign hypertensive kidney disease with chronic kidney disease 06/14/2019   . Sickle cell trait ([place]) 06/14/2019  . Chronic kidney disease, stage 3b ([place]) 06/14/2019  . Chronic bronchitis ([place]) 05/19/2019  . Chronic venous insufficiency 04/15/2019  . Lymphedema 04/15/2019  . Swelling of limb 03/30/2019  . Family history of colon cancer   . Trigeminal neuralgia 09/11/2018  . Cardiac LV ejection fraction of 40-49% 05/07/2017  . Low back pain without sciatica 02/21/2017  . Renal cyst 10/18/2016  . Atherosclerosis of aorta ([place]) 10/16/2016  . History of colonic polyps 09/25/2016  . Dyslipidemia 12/08/2015  . Gastroesophageal reflux disease without esophagitis 08/11/2015  . Primary osteoarthritis of knee 06/13/2015  . Hip bursitis 06/13/2015  . RLS (restless legs syndrome) 06/13/2015  . Depression, major, in remission ([place]) 06/13/2015  . B12 deficiency 06/13/2015  . History of Roux-en-Y gastric bypass 06/13/2015  . History of iron deficiency anemia 06/13/2015  . Vitamin D deficiency 06/13/2015  . Hypertension, benign 06/13/2015  . Perennial allergic rhinitis with seasonal variation 06/13/2015  . Obesity (BMI 30.0-34.9) 06/13/2015  . Primary osteoarthritis of both knees 06/13/2015  . Cervical radiculitis 01/04/2014  . Cervical spinal stenosis 01/04/2014  . Cervical osteoarthritis 01/04/2014   PCP:  Steele Sizer, MD Pharmacy:   [id number] [email or web address] [id number]: [phone]: [phone]    Social Determinants of Health (SDOH) Interventions    Readmission Risk Interventions No flowsheet data found.

## 2020-10-19 NOTE — Progress Notes (Signed)
Physical Therapy Treatment Patient Details Name: Melissa [name] MRN: [id number] DOB: 08/28/1950 Today's Date: 10/19/2020    History of Present Illness Patient is s/p R TKA.    PT Comments    Patient received in bed, reports she is doing well, feels tired. She is mod independent with bed mobility, transfers with cues and min guard. Increased time needed to come from sit to stand. Patient ambulated 35 feet with RW and min guard, cues for sequencing, safety.  Patient is progressing well with mobility and will continue to benefit from skilled PT while here to improve strength, ROM, functional independence and safety.       Follow Up Recommendations  Home health PT     Equipment Recommendations  Rolling walker with 5" wheels    Recommendations for Other Services       Precautions / Restrictions Precautions Precautions: Fall Restrictions Weight Bearing Restrictions: No RLE Weight Bearing: Weight bearing as tolerated    Mobility  Bed Mobility Overal bed mobility: Modified Independent                  Transfers Overall transfer level: Needs assistance Equipment used: Rolling walker (2 wheeled) Transfers: Sit to/from Stand Sit to Stand: Min guard         General transfer comment: Cues for hand placement, increased time and effort  Ambulation/Gait Ambulation/Gait assistance: Min guard Gait Distance (Feet): 35 Feet Assistive device: Rolling walker (2 wheeled) Gait Pattern/deviations: Step-to pattern;Decreased step length - left;Decreased stance time - right;Decreased stride length;Trunk flexed Gait velocity: decr   General Gait Details: patient ambulates with antalgic gait pattern, flexed posture due to back pain. Cues for sequencing and WB status.   Stairs             Wheelchair Mobility    Modified Rankin (Stroke Patients Only)       Balance Overall balance assessment: Needs assistance Sitting-balance support: Feet supported Sitting  balance-Leahy Scale: Good     Standing balance support: Bilateral upper extremity supported;During functional activity Standing balance-Leahy Scale: Fair Standing balance comment: reliant on RW and external assist for safety                            Cognition Arousal/Alertness: Awake/alert Behavior During Therapy: WFL for tasks assessed/performed Overall Cognitive Status: Within Functional Limits for tasks assessed                                        Exercises Total Joint Exercises Ankle Circles/Pumps: AROM;Both;10 reps Quad Sets: AROM;Right;10 reps Heel Slides: AROM;Right;10 reps Straight Leg Raises: AROM;Right;10 reps Long Arc Quad: AROM;Right;10 reps    General Comments        Pertinent Vitals/Pain Pain Assessment: Faces Faces Pain Scale: Hurts little more Pain Location: R knee Pain Descriptors / Indicators: Aching;Guarding;Grimacing;Discomfort Pain Intervention(s): Limited activity within patient's tolerance;Monitored during session;Repositioned;Ice applied    Home Living                      Prior Function            PT Goals (current goals can now be found in the care plan section) Acute Rehab PT Goals Patient Stated Goal: to get better PT Goal Formulation: With patient Time For Goal Achievement: 10/26/20 Potential to Achieve Goals: Good Progress towards PT goals: Progressing toward  goals    Frequency    BID      PT Plan Current plan remains appropriate    Co-evaluation              AM-PAC PT "6 Clicks" Mobility   Outcome Measure  Help needed turning from your back to your side while in a flat bed without using bedrails?: A Little Help needed moving from lying on your back to sitting on the side of a flat bed without using bedrails?: A Little Help needed moving to and from a bed to a chair (including a wheelchair)?: A Little Help needed standing up from a chair using your arms (e.g., wheelchair or  bedside chair)?: A Little Help needed to walk in hospital room?: A Little Help needed climbing 3-5 steps with a railing? : A Little 6 Click Score: 18    End of Session Equipment Utilized During Treatment: Gait belt Activity Tolerance: Patient tolerated treatment well Patient left: in bed;with call bell/phone within reach Nurse Communication: Mobility status PT Visit Diagnosis: Muscle weakness (generalized) (M62.81);Difficulty in walking, not elsewhere classified (R26.2);Pain Pain - Right/Left: Right Pain - part of body: Knee     Time: 1338-1401 PT Time Calculation (min) (ACUTE ONLY): 23 min  Charges:  $Gait Training: 8-22 mins $Therapeutic Exercise: 8-22 mins                     Yvonnie Schinke, PT, GCS 10/19/20,2:16 PM

## 2020-10-19 NOTE — Evaluation (Signed)
Occupational Therapy Evaluation Patient Details Name: Melissa [name] MRN: [id number] DOB: Sep 23, 1950 Today's Date: 10/19/2020    History of Present Illness Patient is s/p R TKA.   Clinical Impression   Patient presenting with decreased I in self care, balance, functional mobility/transfer, endurance, and safety awareness.  Patient reports living at home alone and independent without use of AD PTA. Patient currently functioning S - min A with use of RW for functional transfer and self care tasks. OT reviewed polar care with pt verbalizing understanding. OT discussing how to increase I with self care tasks with pt reporting she does also own reacher. Reviewed that pt could not shower for ~ 2 weeks and will need to sink bathe until then. 3 in 1 commode chair can be used as shower seat when cleared to do so. Patient will benefit from acute OT to increase overall independence in the areas of ADLs, functional mobility, and safety awareness in order to safely discharge home with family. Son will be able to provide assist at discharge as needed.    Follow Up Recommendations  No OT follow up;Supervision - Intermittent    Equipment Recommendations  Other (comment) (RW)       Precautions / Restrictions Precautions Precautions: Fall Restrictions Weight Bearing Restrictions: Yes RLE Weight Bearing: Weight bearing as tolerated      Mobility Bed Mobility Overal bed mobility: Modified Independent                  Transfers Overall transfer level: Needs assistance Equipment used: Rolling walker (2 wheeled) Transfers: Sit to/from Stand Sit to Stand: Min guard         General transfer comment: Cues for hand placement, increased time and effort    Balance Overall balance assessment: Needs assistance Sitting-balance support: Feet supported Sitting balance-Leahy Scale: Good     Standing balance support: Bilateral upper extremity supported;During functional activity Standing  balance-Leahy Scale: Fair Standing balance comment: reliant on RW and external assist for safety                           ADL either performed or assessed with clinical judgement   ADL Overall ADL's : Needs assistance/impaired Eating/Feeding: Independent   Grooming: Wash/dry hands;Wash/dry face;Oral care;Set up                                 General ADL Comments: set up A for UB self care and OT anticipates min - mod A for LB self care     Vision Patient Visual Report: No change from baseline              Pertinent Vitals/Pain Pain Assessment: Faces Faces Pain Scale: Hurts even more Pain Location: R knee Pain Descriptors / Indicators: Aching;Guarding;Grimacing;Discomfort Pain Intervention(s): Limited activity within patient's tolerance;Monitored during session;Repositioned;Ice applied;RN gave pain meds during session     Hand Dominance Right   Extremity/Trunk Assessment Upper Extremity Assessment Upper Extremity Assessment: Overall WFL for tasks assessed   Lower Extremity Assessment Lower Extremity Assessment: Defer to PT evaluation       Communication Communication Communication: No difficulties   Cognition Arousal/Alertness: Awake/alert Behavior During Therapy: WFL for tasks assessed/performed Overall Cognitive Status: Within Functional Limits for tasks assessed  Exercises Total Joint Exercises Ankle Circles/Pumps: AROM;Both;10 reps Quad Sets: AROM;Right;10 reps Heel Slides: AROM;Right;10 reps Straight Leg Raises: AROM;Right;10 reps Long Arc Quad: AROM;Right;10 reps   Shoulder Instructions      Home Living Family/patient expects to be discharged to:: Private residence Living Arrangements: Children Available Help at Discharge: Family;Available PRN/intermittently Type of Home: House Home Access: Level entry     Home Layout: Two level;Able to live on main level with  bedroom/bathroom Alternate Level Stairs-Number of Steps: flight   Bathroom Shower/Tub: Occupational psychologist: Standard Bathroom Accessibility: Yes   Home Equipment: Bedside commode;Walker - 2 wheels          Prior Functioning/Environment Level of Independence: Independent                 OT Problem List: Decreased strength;Impaired balance (sitting and/or standing);Pain;Decreased range of motion;Decreased safety awareness;Decreased activity tolerance;Decreased knowledge of use of DME or AE      OT Treatment/Interventions: Self-care/ADL training;Manual therapy;Therapeutic exercise;Patient/family education;Energy conservation;DME and/or AE instruction;Therapeutic activities;Balance training    OT Goals(Current goals can be found in the care plan section) Acute Rehab OT Goals Patient Stated Goal: to get better OT Goal Formulation: With patient Time For Goal Achievement: 11/02/20 ADL Goals Pt Will Perform Grooming: with modified independence;standing Pt Will Transfer to Toilet: with modified independence;ambulating Pt Will Perform Toileting - Clothing Manipulation and hygiene: with modified independence;sit to/from stand Pt Will Perform Tub/Shower Transfer: with modified independence;ambulating;shower seat  OT Frequency: Min 2X/week   Barriers to D/C:    none known at this time          AM-PAC OT "6 Clicks" Daily Activity     Outcome Measure Help from another person eating meals?: None Help from another person taking care of personal grooming?: None Help from another person toileting, which includes using toliet, bedpan, or urinal?: A Little Help from another person bathing (including washing, rinsing, drying)?: A Little Help from another person to put on and taking off regular upper body clothing?: None Help from another person to put on and taking off regular lower body clothing?: A Little 6 Click Score: 21   End of Session Equipment Utilized During  Treatment: Rolling walker Nurse Communication: Mobility status  Activity Tolerance: Patient tolerated treatment well Patient left: in bed;with call bell/phone within reach;with bed alarm set  OT Visit Diagnosis: Unsteadiness on feet (R26.81);Muscle weakness (generalized) (M62.81);Pain Pain - Right/Left: Right Pain - part of body: Knee                Time: 8250-0370 OT Time Calculation (min): 28 min Charges:  OT General Charges $OT Visit: 1 Visit OT Evaluation $OT Eval Low Complexity: 1 Low OT Treatments $Self Care/Home Management : 8-22 mins $Therapeutic Activity: 8-22 mins  Darleen Crocker, MS, OTR/L , CBIS ascom [phone]  10/19/20, 4:01 PM

## 2020-10-20 MED ORDER — OXYCODONE HCL 5 MG PO TABS: 5.0000 mg | ORAL_TABLET | ORAL | 0 refills | Status: DC | PRN

## 2020-10-20 MED ORDER — CELECOXIB 200 MG PO CAPS: 200.0000 mg | ORAL_CAPSULE | Freq: Two times a day (BID) | ORAL | 0 refills | Status: DC

## 2020-10-20 MED ORDER — ENOXAPARIN SODIUM 40 MG/0.4ML ~~LOC~~ SOLN: 40.0000 mg | SUBCUTANEOUS | 0 refills | Status: DC

## 2020-10-20 NOTE — Progress Notes (Signed)
  Subjective: 2 Days Post-Op Procedure(s) (LRB): COMPUTER ASSISTED TOTAL KNEE ARTHROPLASTY (Right) Patient reports pain as well-controlled.   Patient is well, and has had no acute complaints or problems Plan is to go Home after hospital stay. Negative for chest pain and shortness of breath Fever: no Gastrointestinal: negative for nausea and vomiting.   Patient has had a bowel movement.  Objective: Vital signs in last 24 hours: Temp:  [97.7 F (36.5 C)-98.6 F (37 C)] 97.7 F (36.5 C) (03/04 0359) Pulse Rate:  [68-85] 78 (03/04 0359) Resp:  [16-18] 17 (03/04 0359) BP: (127-140)/(66-89) 127/66 (03/04 0359) SpO2:  [100 %] 100 % (03/04 0359)  Intake/Output from previous day:  Intake/Output Summary (Last 24 hours) at 10/20/2020 0817 Last data filed at 10/20/2020 0636 Gross per 24 hour  Intake 360 ml  Output 800 ml  Net -440 ml    Intake/Output this shift: No intake/output data recorded.  Labs: Recent Labs    10/18/20 1201  HGB 11.6*   Recent Labs    10/18/20 1201  HCT 34.0*   Recent Labs    10/18/20 1201  NA 143  K 3.5  CL 106  BUN 23  CREATININE 1.60*  GLUCOSE 54*   No results for input(s): LABPT, INR in the last 72 hours.   EXAM General - Patient is Alert, Appropriate and Oriented Extremity - Neurovascular intact Dorsiflexion/Plantar flexion intact Compartment soft Dressing/Incision -Postoperative dressing remains in place., Polar Care in place and working. , Hemovac in place. Following post-op dressing removal, minimal sanguinous drainage noted.  Motor Function - intact, moving foot and toes well on exam.  Cardiovascular- Regular rate and rhythm, no murmurs/rubs/gallops Respiratory- Lungs clear to auscultation bilaterally Gastrointestinal- soft and nontender   Assessment/Plan: 2 Days Post-Op Procedure(s) (LRB): COMPUTER ASSISTED TOTAL KNEE ARTHROPLASTY (Right) Active Problems:   Total knee replacement status  Estimated body mass index is 29.32  kg/m as calculated from the following:   Height as of this encounter: 5\' 11"  (1.803 m).   Weight as of this encounter: 95.3 kg. Advance diet Up with therapy Discharge home with home health pending completion of PT goals    DVT Prophylaxis - Lovenox, Ted hose and foot pumps Weight-Bearing as tolerated to right leg  Cassell Smiles, [name] [facility] Orthopaedic Surgery 10/20/2020, 8:17 AM

## 2020-10-20 NOTE — Discharge Summary (Signed)
Physician Discharge Summary  Patient ID: Melissa [name] MRN: [id number] DOB/AGE: 1951-04-01 70 y.o.  Admit date: 10/18/2020 Discharge date: 10/20/2020  Admission Diagnoses:  Total knee replacement status [Z96.659]  Surgeries:Procedure(s):   Right total knee arthroplasty using computer-assisted navigation  SURGEON:  Marciano Sequin. M.D.  ASSISTANT: Cassell Smiles, [name] (present and scrubbed throughout the case, critical for assistance with exposure, retraction, instrumentation, and closure)  ANESTHESIA: spinal  ESTIMATED BLOOD LOSS: 50 mL  FLUIDS REPLACED: 1000 mL of crystalloid  TOURNIQUET TIME: 98 minutes  DRAINS: 2 medium Hemovac drains  SOFT TISSUE RELEASES: Anterior cruciate ligament, posterior cruciate ligament, deep medial collateral ligament, patellofemoral ligament, and posterolateral corner  IMPLANTS UTILIZED: DePuy Attune size 6 posterior stabilized femoral component (cemented), size 6 rotating platform tibial component (cemented), 38 mm medialized dome patella (cemented), and a 5 mm stabilized rotating platform polyethylene insert.  Discharge Diagnoses: Patient Active Problem List   Diagnosis Date Noted  . Total knee replacement status 10/18/2020  . Primary osteoarthritis of right hip 08/06/2020  . Sarcoidosis 05/29/2020  . 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 COVID-19 virus infection 09/06/2019  . Localized swelling of right lower leg 09/06/2019  . OSA on CPAP 09/06/2019  . Secondary hyperparathyroidism of renal origin ([place]) 07/21/2019  . Anemia in chronic kidney disease 06/14/2019  . Benign hypertensive kidney disease with chronic kidney disease 06/14/2019  . Sickle cell trait ([place]) 06/14/2019  . Chronic kidney disease, stage 3b ([place]) 06/14/2019  . Chronic bronchitis ([place]) 05/19/2019  . Chronic venous insufficiency 04/15/2019  . Lymphedema 04/15/2019  . Swelling of limb 03/30/2019  . Family history of colon cancer   . Trigeminal neuralgia  09/11/2018  . Cardiac LV ejection fraction of 40-49% 05/07/2017  . Low back pain without sciatica 02/21/2017  . Renal cyst 10/18/2016  . Atherosclerosis of aorta ([place]) 10/16/2016  . History of colonic polyps 09/25/2016  . Dyslipidemia 12/08/2015  . Gastroesophageal reflux disease without esophagitis 08/11/2015  . Primary osteoarthritis of knee 06/13/2015  . Hip bursitis 06/13/2015  . RLS (restless legs syndrome) 06/13/2015  . Depression, major, in remission ([place]) 06/13/2015  . B12 deficiency 06/13/2015  . History of Roux-en-Y gastric bypass 06/13/2015  . History of iron deficiency anemia 06/13/2015  . Vitamin D deficiency 06/13/2015  . Hypertension, benign 06/13/2015  . Perennial allergic rhinitis with seasonal variation 06/13/2015  . Obesity (BMI 30.0-34.9) 06/13/2015  . Primary osteoarthritis of both knees 06/13/2015  . Cervical radiculitis 01/04/2014  . Cervical spinal stenosis 01/04/2014  . Cervical osteoarthritis 01/04/2014    Past Medical History:  Diagnosis Date  . Aortic atherosclerosis ([place])   . Bruising   . Cardiomyopathy ([place])   . Celiac artery aneurysm ([place])   . Cervical radiculopathy   . CKD (chronic kidney disease), stage III ([place])   . Deficiency of vitamin B   . Depression   . Edema   . Flank pain   . Gross hematuria   . History of 2019 novel coronavirus disease (COVID-19) 05/01/2019  . History of bariatric surgery   . HLD (hyperlipidemia)   . HTN (hypertension)   . Interstitial lung disease ([place])   . Iron deficiency anemia   . Leukopenia   . OA (osteoarthritis)   . Obesity   . OSA on CPAP   . Osteopenia   . Perennial allergic rhinitis   . Pneumonia due to COVID-19 virus 2020  . Renal cysts, acquired, bilateral   . RLS (restless legs syndrome)   .  Sarcoidosis   . Sickle cell trait ([place])   . Snoring   . Spinal stenosis   . Trigeminal neuralgia      Transfusion:    Consultants (if any):   Discharged Condition: Improved  Hospital Course:  Melissa [name] is an 70 y.o. female who was admitted 10/18/2020 with a diagnosis of right knee osteoarthritis and went to the operating room on 10/18/2020 and underwent right total knee arthroplasty. The patient received perioperative antibiotics for prophylaxis (see below). The patient tolerated the procedure well and was transported to PACU in stable condition. After meeting PACU criteria, the patient was subsequently transferred to the Orthopaedics/Rehabilitation unit.   The patient received DVT prophylaxis in the form of early mobilization, Lovenox, Foot Pumps and TED hose. A sacral pad had been placed and heels were elevated off of the bed with rolled towels in order to protect skin integrity. Foley catheter was discontinued on postoperative day #0. Wound drains were discontinued on postoperative day #2. The surgical incision was healing well without signs of infection.  Physical therapy was initiated postoperatively for transfers, gait training, and strengthening. Occupational therapy was initiated for activities of daily living and evaluation for assisted devices. Rehabilitation goals were reviewed in detail with the patient. The patient made steady progress with physical therapy and physical therapy recommended discharge to Home.   The patient achieved the preliminary goals of this hospitalization and was felt to be medically and orthopaedically appropriate for discharge.  She was given perioperative antibiotics:  Anti-infectives (From admission, onward)   Start     Dose/Rate Route Frequency Ordered Stop   10/18/20 1915  ceFAZolin (ANCEF) IVPB 2g/100 mL premix        2 g 200 mL/hr over 30 Minutes Intravenous Every 6 hours 10/18/20 1822 10/19/20 0213   10/18/20 1118  ceFAZolin (ANCEF) 2-4 GM/100ML-% IVPB       Note to Pharmacy: Ronnell Freshwater   : cabinet override      10/18/20 1118 10/18/20 1308   10/18/20 1115  ceFAZolin (ANCEF) IVPB 2g/100 mL premix        2 g 200 mL/hr over 30 Minutes  Intravenous On call to O.R. 10/18/20 1105 10/18/20 1305    .  Recent vital signs:  Vitals:   10/20/20 1135 10/20/20 1552  BP: 127/78 130/82  Pulse: 89 96  Resp: 16 16  Temp: 98 F (36.7 C) 98.2 F (36.8 C)  SpO2:  100%    Recent laboratory studies:  Recent Labs    10/18/20 1201  HGB 11.6*  HCT 34.0*  K 3.5  CL 106  BUN 23  CREATININE 1.60*  GLUCOSE 54*    Diagnostic Studies: DG Knee Right Port  Result Date: 10/18/2020 CLINICAL DATA:  Status post knee replacement EXAM: PORTABLE RIGHT KNEE - 1-2 VIEW COMPARISON:  None. FINDINGS: Status post RIGHT knee arthroplasty. Hardware appears intact and appropriately positioned. Osseous alignment is anatomic. Expected postsurgical changes within the overlying soft tissues. Drains in place. IMPRESSION: Status post RIGHT knee arthroplasty. No evidence of surgical complicating feature. Electronically Signed   By: Franki Cabot M.D.   On: 10/18/2020 17:03   US BREAST LTD UNI RIGHT INC AXILLA  Result Date: 10/04/2020 CLINICAL DATA:  Recall from screening mammogram for possible asymmetry in the right breast. EXAM: DIGITAL DIAGNOSTIC UNILATERAL RIGHT MAMMOGRAM WITH TOMOSYNTHESIS AND CAD; ULTRASOUND RIGHT BREAST LIMITED TECHNIQUE: Right digital diagnostic mammography and breast tomosynthesis was performed. The images were evaluated with computer-aided detection.; Targeted ultrasound examination of  the right breast was performed COMPARISON:  Previous exam(s). ACR Breast Density Category c: The breast tissue is heterogeneously dense, which may obscure small masses. FINDINGS: The previously described asymmetry does not persist with additional views, consistent with superimposed fibroglandular tissue. No suspicious mass, microcalcification, or other finding is identified. Targeted ultrasound was performed in the area of the previously described asymmetry from the 8 o'clock to the 10 o'clock position. No suspicious solid or cystic mass is identified in this  area. IMPRESSION: No evidence of malignancy in the right breast. RECOMMENDATION: Recommend routine annual screening mammogram in 1 year. I have discussed the findings and recommendations with the patient. If applicable, a reminder letter will be sent to the patient regarding the next appointment. BI-RADS CATEGORY  1: Negative. Electronically Signed   By: Zerita Boers M.D.   On: 10/04/2020 17:13   MM DIAG BREAST TOMO UNI RIGHT  Result Date: 10/04/2020 CLINICAL DATA:  Recall from screening mammogram for possible asymmetry in the right breast. EXAM: DIGITAL DIAGNOSTIC UNILATERAL RIGHT MAMMOGRAM WITH TOMOSYNTHESIS AND CAD; ULTRASOUND RIGHT BREAST LIMITED TECHNIQUE: Right digital diagnostic mammography and breast tomosynthesis was performed. The images were evaluated with computer-aided detection.; Targeted ultrasound examination of the right breast was performed COMPARISON:  Previous exam(s). ACR Breast Density Category c: The breast tissue is heterogeneously dense, which may obscure small masses. FINDINGS: The previously described asymmetry does not persist with additional views, consistent with superimposed fibroglandular tissue. No suspicious mass, microcalcification, or other finding is identified. Targeted ultrasound was performed in the area of the previously described asymmetry from the 8 o'clock to the 10 o'clock position. No suspicious solid or cystic mass is identified in this area. IMPRESSION: No evidence of malignancy in the right breast. RECOMMENDATION: Recommend routine annual screening mammogram in 1 year. I have discussed the findings and recommendations with the patient. If applicable, a reminder letter will be sent to the patient regarding the next appointment. BI-RADS CATEGORY  1: Negative. Electronically Signed   By: Zerita Boers M.D.   On: 10/04/2020 17:13   MM 3D SCREEN BREAST BILATERAL  Result Date: 09/25/2020 CLINICAL DATA:  Screening. EXAM: DIGITAL SCREENING BILATERAL MAMMOGRAM WITH  TOMOSYNTHESIS AND CAD TECHNIQUE: Bilateral screening digital craniocaudal and mediolateral oblique mammograms were obtained. Bilateral screening digital breast tomosynthesis was performed. The images were evaluated with computer-aided detection. COMPARISON:  Previous exam(s). ACR Breast Density Category c: The breast tissue is heterogeneously dense, which may obscure small masses. FINDINGS: In the right breast, a possible asymmetry warrants further evaluation. In the left breast, no findings suspicious for malignancy. The images were evaluated with computer-aided detection. IMPRESSION: Further evaluation is suggested for possible asymmetry in the right breast. RECOMMENDATION: Diagnostic mammogram and possibly ultrasound of the right breast. (Code:[id number]) The patient will be contacted regarding the findings, and additional imaging will be scheduled. BI-RADS CATEGORY  0: Incomplete. Need additional imaging evaluation and/or prior mammograms for comparison. Electronically Signed   By: Lillia Mountain M.D.   On: 09/25/2020 10:59    Discharge Medications:   Allergies as of 10/20/2020      Reactions   Contrast Media [iodinated Diagnostic Agents]    Unknown reaction   Shellfish Allergy    Edema      Medication List    STOP taking these medications   aspirin 81 MG EC tablet     TAKE these medications   acetaminophen 650 MG CR tablet Commonly known as: TYLENOL Take 1,300 mg by mouth every 8 (eight) hours as needed for  pain.   albuterol 108 (90 Base) MCG/ACT inhaler Commonly known as: VENTOLIN HFA Inhale 2 puffs into the lungs every 6 (six) hours as needed for wheezing or shortness of breath.   atorvastatin 40 MG tablet Commonly known as: LIPITOR Take 1 tablet (40 mg total) by mouth daily.   azelastine 0.1 % nasal spray Commonly known as: ASTELIN 2 SPRAYS IN EACH NOSTRIL DAILY   budesonide 0.5 MG/2ML nebulizer solution Commonly known as: Pulmicort Take 2 mLs (0.5 mg total) by nebulization in  the morning and at bedtime.   calcium carbonate 500 MG chewable tablet Commonly known as: TUMS - dosed in mg elemental calcium Chew 500-1,000 mg by mouth daily as needed for indigestion or heartburn.   carbamazepine 200 MG tablet Commonly known as: TEGRETOL Take 200 mg by mouth 3 (three) times daily as needed (trigeminal neuralgia).   celecoxib 200 MG capsule Commonly known as: CELEBREX Take 1 capsule (200 mg total) by mouth 2 (two) times daily.   Centrum Silver tablet Take 1 tablet by mouth daily.   DSS 100 MG Caps Take 1 capsule by mouth 2 (two) times daily as needed for constipation.   enoxaparin 40 MG/0.4ML injection Commonly known as: LOVENOX Inject 0.4 mLs (40 mg total) into the skin daily for 14 days.   ferrous sulfate 325 (65 FE) MG tablet Take 325 mg by mouth daily with breakfast.   fluticasone 50 MCG/ACT nasal spray Commonly known as: FLONASE 2 sprays to each nostril once daily.  Can use twice daily for the first 3 days What changed:   how much to take  how to take this  when to take this  additional instructions   gabapentin 100 MG capsule Commonly known as: NEURONTIN Take 100 mg by mouth at bedtime as needed (pain).   ipratropium-albuterol 0.5-2.5 (3) MG/3ML Soln Commonly known as: DUONEB Take 3 mLs by nebulization every 6 (six) hours as needed.   linaclotide 72 MCG capsule Commonly known as: LINZESS Take 1 capsule (72 mcg total) by mouth daily before breakfast.   loratadine 10 MG tablet Commonly known as: CLARITIN Take 1 tablet (10 mg total) by mouth daily.   losartan 25 MG tablet Commonly known as: COZAAR TAKE 1 TABLET BY MOUTH EVERY DAY   magnesium oxide 400 MG tablet Commonly known as: MAG-OX Take 400 mg by mouth daily.   oxyCODONE 5 MG immediate release tablet Commonly known as: Oxy IR/ROXICODONE Take 1 tablet (5 mg total) by mouth every 4 (four) hours as needed for moderate pain (pain score 4-6).   vitamin C 1000 MG tablet Take  1,000 mg by mouth daily.   zinc gluconate 50 MG tablet Take 50 mg by mouth daily.            Durable Medical Equipment  (From admission, onward)         Start     Ordered   10/18/20 1822  DME Walker rolling  Once       Question:  Patient needs a walker to treat with the following condition  Answer:  Total knee replacement status   10/18/20 1822   10/18/20 1822  DME Bedside commode  Once       Question:  Patient needs a bedside commode to treat with the following condition  Answer:  Total knee replacement status   10/18/20 1822          Disposition: home with home health PT      Follow-up Information  Fausto Skillern, [name] On 11/02/2020.   Specialty: Orthopedic Surgery Why: at 1:15pm Contact information: [place] [address] [phone]        Dereck Leep, MD On 11/30/2020.   Specialty: Orthopedic Surgery Why: at 9:45am Contact information: [place] [address] [place], [name] 10/20/2020, 4:44 PM

## 2020-10-20 NOTE — Progress Notes (Signed)
Pt scored a 3 on the RT protocol assessment. She has been refusing the pulmicort treatments, stating she only uses her inhaler prn. The nebulizer treatments are being D/C.

## 2020-10-20 NOTE — Plan of Care (Signed)
  Problem: Education: Goal: Knowledge of General Education information will improve Description: Including pain rating scale, medication(s)/side effects and non-pharmacologic comfort measures Outcome: Adequate for Discharge   Problem: Health Behavior/Discharge Planning: Goal: Ability to manage health-related needs will improve Outcome: Adequate for Discharge   Problem: Clinical Measurements: Goal: Diagnostic test results will improve Outcome: Adequate for Discharge  Patient discharged home per order at this time.All medication orders reviewed with patient at bedside.Follow up appointments also provided to patient.All personal belongings in the room handed over to patient. Patient expressed understanding and will comply with [facility] recommendations and wound care.

## 2020-10-20 NOTE — Progress Notes (Signed)
Occupational Therapy Treatment Patient Details Name: Melissa [name] MRN: [id number] DOB: 1951-03-04 Today's Date: 10/20/2020    History of present illness Patient is s/p R TKA.   OT comments  Upon entering the room, pt supine in bed and agreeable to OT intervention. Pt with 2/10 pain in R knee initially with pt slightly increasing with mobility to 4/10. RN notified. Pt performed side stepping with use of RW through tight space in room and then into bathroom for toileting need. Pt needing min A to stand from standard height toilet with min guard for balance while managing clothing in standing. Pt returning to sink with side steps and standing at sink for hand hygiene and oral care with close supervision. Pt then ambulating to recliner chair with polar care donned at end of session. All needs within reach. Education to continue in regards to self care and energy conservation with self care tasks.   Follow Up Recommendations  No OT follow up;Supervision - Intermittent    Equipment Recommendations  Other (comment) (RW)       Precautions / Restrictions Precautions Precautions: Fall Precaution Comments: mod fall Restrictions Weight Bearing Restrictions: No RLE Weight Bearing: Weight bearing as tolerated       Mobility Bed Mobility Overal bed mobility: Needs Assistance Bed Mobility: Supine to Sit     Supine to sit: Supervision;HOB elevated     General bed mobility comments: min cuing for technique    Transfers Overall transfer level: Needs assistance Equipment used: Rolling walker (2 wheeled) Transfers: Sit to/from [facility] Sit to Stand: Supervision Stand pivot transfers: Supervision       General transfer comment: able to recall proper hand placement and technique    Balance Overall balance assessment: Needs assistance Sitting-balance support: Feet supported Sitting balance-Leahy Scale: Good     Standing balance support: Bilateral upper extremity  supported;During functional activity Standing balance-Leahy Scale: Fair Standing balance comment: reliant on RW and external assist for safety                           ADL either performed or assessed with clinical judgement   ADL Overall ADL's : Needs assistance/impaired     Grooming: Wash/dry hands;Oral care;Supervision/safety;Standing                   Toilet Transfer: Minimal Teacher, English as a foreign language;Ambulation;RW   Toileting- Water quality scientist and Hygiene: Min guard;Sit to/from stand       Functional mobility during ADLs: Supervision/safety;Min guard;Rolling walker       Vision Patient Visual Report: No change from baseline            Cognition Arousal/Alertness: Awake/alert Behavior During Therapy: WFL for tasks assessed/performed Overall Cognitive Status: Within Functional Limits for tasks assessed                                                     Pertinent Vitals/ Pain       Pain Assessment: Faces Faces Pain Scale: Hurts little more Pain Location: R knee Pain Descriptors / Indicators: Aching;Guarding;Grimacing;Discomfort;Sore Pain Intervention(s): Limited activity within patient's tolerance;Monitored during session;Repositioned;Patient requesting pain meds-RN notified  Home Living Family/patient expects to be discharged to:: Private residence Living Arrangements: Children Available Help at Discharge: Family;Available PRN/intermittently  Frequency  Min 2X/week        Progress Toward Goals  OT Goals(current goals can now be found in the care plan section)  Progress towards OT goals: Progressing toward goals  Acute Rehab OT Goals Patient Stated Goal: to get better and go home OT Goal Formulation: With patient Time For Goal Achievement: 11/02/20  Plan Discharge plan remains appropriate       AM-PAC OT "6 Clicks" Daily Activity     Outcome  Measure   Help from another person eating meals?: None Help from another person taking care of personal grooming?: None Help from another person toileting, which includes using toliet, bedpan, or urinal?: A Little Help from another person bathing (including washing, rinsing, drying)?: A Little Help from another person to put on and taking off regular upper body clothing?: None Help from another person to put on and taking off regular lower body clothing?: A Little 6 Click Score: 21    End of Session Equipment Utilized During Treatment: Rolling walker  OT Visit Diagnosis: Unsteadiness on feet (R26.81);Muscle weakness (generalized) (M62.81);Pain Pain - Right/Left: Right Pain - part of body: Knee   Activity Tolerance Patient tolerated treatment well   Patient Left with call bell/phone within reach;in chair;with chair alarm set   Nurse Communication Mobility status        Time: 2111-7356 OT Time Calculation (min): 23 min  Charges: OT General Charges $OT Visit: 1 Visit OT Treatments $Self Care/Home Management : 23-37 mins  Darleen Crocker, MS, OTR/L , CBIS ascom [phone]  10/20/20, 3:27 PM

## 2020-10-20 NOTE — Progress Notes (Signed)
Physical Therapy Treatment Patient Details Name: Melissa [name] MRN: [id number] DOB: 20-Feb-1951 Today's Date: 10/20/2020    History of Present Illness Patient is s/p R TKA.    PT Comments    Patient received in recliner. She is ready for PT session. Patient transfers with supervision. Cues given to stay inside RW. Patient ambulated 150 feet this session and up/down 4 steps with B rails. Cues for sequencing and min guard to supervision. Bed level exercises reviewed and performed, handout given. Patient progressing well with therapy and motivated to improve. She will continue to benefit from skilled PT while here to improve rom and functional independence.         Follow Up Recommendations  Home health PT     Equipment Recommendations  Other (comment) (all equipment delivered)    Recommendations for Other Services       Precautions / Restrictions Precautions Precautions: Fall Precaution Comments: mod fall Restrictions Weight Bearing Restrictions: No RLE Weight Bearing: Weight bearing as tolerated    Mobility  Bed Mobility Overal bed mobility: Modified Independent Bed Mobility: Supine to Sit;Sit to Supine     Supine to sit: Modified independent (Device/Increase time) Sit to supine: Modified independent (Device/Increase time)   General bed mobility comments: min cuing for technique    Transfers Overall transfer level: Needs assistance Equipment used: Rolling walker (2 wheeled) Transfers: Sit to/from Stand Sit to Stand: Supervision Stand pivot transfers: Supervision       General transfer comment: able to recall proper hand placement and technique  Ambulation/Gait Ambulation/Gait assistance: Min guard;Supervision Gait Distance (Feet): 150 Feet Assistive device: Rolling walker (2 wheeled) Gait Pattern/deviations: Step-to pattern;Decreased step length - left;Decreased stance time - right;Decreased stride length Gait velocity: decr   General Gait Details: Cues to  stay inside RW   Stairs Stairs: Yes Stairs assistance: Supervision;Min guard Stair Management: Two rails Number of Stairs: 4 General stair comments: generally safe with steps. Cues for sequencing provided.   Wheelchair Mobility    Modified Rankin (Stroke Patients Only)       Balance Overall balance assessment: Needs assistance Sitting-balance support: Feet supported Sitting balance-Leahy Scale: Good     Standing balance support: Bilateral upper extremity supported;During functional activity Standing balance-Leahy Scale: Fair Standing balance comment: reliant on RW and supervision                            Cognition Arousal/Alertness: Awake/alert Behavior During Therapy: WFL for tasks assessed/performed Overall Cognitive Status: Within Functional Limits for tasks assessed                                        Exercises Total Joint Exercises Ankle Circles/Pumps: AROM;Both;10 reps Quad Sets: AROM;Right;10 reps Short Arc Quad: AROM;Right;10 reps Heel Slides: AROM;Right;10 reps Hip ABduction/ADduction: AROM;Right;10 reps Straight Leg Raises: AROM;Right;10 reps    General Comments        Pertinent Vitals/Pain Pain Assessment: Faces Faces Pain Scale: Hurts a little bit Pain Location: R knee Pain Descriptors / Indicators: Aching;Guarding;Grimacing;Discomfort;Sore Pain Intervention(s): Monitored during session;Premedicated before session;Repositioned;Ice applied    Home Living Family/patient expects to be discharged to:: Private residence Living Arrangements: Children Available Help at Discharge: Family;Available PRN/intermittently                Prior Function            PT  Goals (current goals can now be found in the care plan section) Acute Rehab PT Goals Patient Stated Goal: to get better and go home PT Goal Formulation: With patient Time For Goal Achievement: 10/26/20 Potential to Achieve Goals: Good Progress  towards PT goals: Progressing toward goals    Frequency    BID      PT Plan Current plan remains appropriate    Co-evaluation              AM-PAC PT "6 Clicks" Mobility   Outcome Measure  Help needed turning from your back to your side while in a flat bed without using bedrails?: None Help needed moving from lying on your back to sitting on the side of a flat bed without using bedrails?: None Help needed moving to and from a bed to a chair (including a wheelchair)?: A Little Help needed standing up from a chair using your arms (e.g., wheelchair or bedside chair)?: A Little Help needed to walk in hospital room?: A Little Help needed climbing 3-5 steps with a railing? : A Little 6 Click Score: 20    End of Session Equipment Utilized During Treatment: Gait belt Activity Tolerance: Patient tolerated treatment well Patient left: in bed;with call bell/phone within reach;with SCD's reapplied;Other (comment) (patient in bone foam and Ice applied) Nurse Communication: Mobility status PT Visit Diagnosis: Muscle weakness (generalized) (M62.81);Difficulty in walking, not elsewhere classified (R26.2);Pain Pain - Right/Left: Right Pain - part of body: Knee     Time: 4599-7741 PT Time Calculation (min) (ACUTE ONLY): 25 min  Charges:  $Gait Training: 8-22 mins $Therapeutic Exercise: 8-22 mins                     Mallerie Blok, PT, GCS 10/20/20,3:43 PM

## 2020-10-20 NOTE — Progress Notes (Signed)
Physical Therapy Treatment Patient Details Name: Melissa [name] MRN: [id number] DOB: [birth year] Today's Date: 10/20/2020    History of Present Illness Patient is s/p R TKA.    PT Comments    Patient received in bed, ready for PT session. Patient reports she slept really well last night. Patient with increased stiffness and soreness this am. She is mod independent with bed mobility, supervision for transfers with good recall for technique. Ambulated 60 feet with RW and min guard.  Patient reports moderate fatigue with ambulation limiting distance. She will continue to benefit from skilled PT while here to improve rom and strength as well as ambulation and endurance.     Follow Up Recommendations  Home health PT     Equipment Recommendations  Rolling walker with 5" wheels    Recommendations for Other Services       Precautions / Restrictions Precautions Precautions: Fall Precaution Comments: mod fall Restrictions Weight Bearing Restrictions: No RLE Weight Bearing: Weight bearing as tolerated    Mobility  Bed Mobility Overal bed mobility: Modified Independent                  Transfers Overall transfer level: Needs assistance Equipment used: Rolling walker (2 wheeled) Transfers: Sit to/from Stand Sit to Stand: Supervision         General transfer comment: able to recall proper hand placement and technique  Ambulation/Gait Ambulation/Gait assistance: Min guard Gait Distance (Feet): 60 Feet Assistive device: Rolling walker (2 wheeled) Gait Pattern/deviations: Step-to pattern;Decreased step length - left;Decreased stance time - right;Trunk flexed;Decreased stride length Gait velocity: decr   General Gait Details: patient ambulates with antalgic gait pattern, flexed posture due to back pain. Cues for sequencing and WB status.  Cues given to ambulate with flat foot   Stairs             Wheelchair Mobility    Modified Rankin (Stroke Patients Only)        Balance Overall balance assessment: Needs assistance Sitting-balance support: Feet supported Sitting balance-Leahy Scale: Good     Standing balance support: Bilateral upper extremity supported;During functional activity Standing balance-Leahy Scale: Fair Standing balance comment: reliant on RW and external assist for safety                            Cognition Arousal/Alertness: Awake/alert Behavior During Therapy: WFL for tasks assessed/performed Overall Cognitive Status: Within Functional Limits for tasks assessed                                        Exercises Total Joint Exercises Ankle Circles/Pumps: AROM;Both;10 reps Quad Sets: AROM;Right;10 reps Heel Slides: AROM;Right;10 reps Hip ABduction/ADduction: AROM;Right;10 reps Straight Leg Raises: AROM;Right;10 reps Long Arc Quad: AROM;Right;10 reps Goniometric ROM: 0-75    General Comments        Pertinent Vitals/Pain Pain Assessment: Faces Faces Pain Scale: Hurts little more Pain Location: R knee Pain Descriptors / Indicators: Aching;Guarding;Grimacing;Discomfort;Sore Pain Intervention(s): Monitored during session;Limited activity within patient's tolerance;Ice applied;Premedicated before session    Home Living                      Prior Function            PT Goals (current goals can now be found in the care plan section) Acute Rehab PT Goals Patient Stated Goal:  to get better PT Goal Formulation: With patient Time For Goal Achievement: 10/26/20 Potential to Achieve Goals: Good Progress towards PT goals: Progressing toward goals    Frequency    BID      PT Plan Current plan remains appropriate    Co-evaluation              AM-PAC PT "6 Clicks" Mobility   Outcome Measure  Help needed turning from your back to your side while in a flat bed without using bedrails?: None Help needed moving from lying on your back to sitting on the side of a flat bed  without using bedrails?: A Little Help needed moving to and from a bed to a chair (including a wheelchair)?: A Little Help needed standing up from a chair using your arms (e.g., wheelchair or bedside chair)?: A Little Help needed to walk in hospital room?: A Little Help needed climbing 3-5 steps with a railing? : A Little 6 Click Score: 19    End of Session Equipment Utilized During Treatment: Gait belt Activity Tolerance: Patient tolerated treatment well Patient left: in chair;with call bell/phone within reach;with chair alarm set Nurse Communication: Mobility status PT Visit Diagnosis: Muscle weakness (generalized) (M62.81);Difficulty in walking, not elsewhere classified (R26.2);Pain Pain - Right/Left: Right Pain - part of body: Knee     Time: 8916-9450 PT Time Calculation (min) (ACUTE ONLY): 23 min  Charges:  $Gait Training: 8-22 mins $Therapeutic Exercise: 8-22 mins                     Melvenia Favela, PT, GCS 10/20/20,10:24 AM

## 2020-10-22 DIAGNOSIS — Z8616 Personal history of COVID-19: Secondary | ICD-10-CM | POA: Diagnosis not present

## 2020-10-22 DIAGNOSIS — E538 Deficiency of other specified B group vitamins: Secondary | ICD-10-CM | POA: Diagnosis not present

## 2020-10-22 DIAGNOSIS — E669 Obesity, unspecified: Secondary | ICD-10-CM | POA: Diagnosis not present

## 2020-10-22 DIAGNOSIS — Z7901 Long term (current) use of anticoagulants: Secondary | ICD-10-CM | POA: Diagnosis not present

## 2020-10-22 DIAGNOSIS — Z6829 Body mass index (BMI) 29.0-29.9, adult: Secondary | ICD-10-CM | POA: Diagnosis not present

## 2020-10-22 DIAGNOSIS — N200 Calculus of kidney: Secondary | ICD-10-CM | POA: Diagnosis not present

## 2020-10-22 DIAGNOSIS — M48061 Spinal stenosis, lumbar region without neurogenic claudication: Secondary | ICD-10-CM | POA: Diagnosis not present

## 2020-10-22 DIAGNOSIS — D72819 Decreased white blood cell count, unspecified: Secondary | ICD-10-CM | POA: Diagnosis not present

## 2020-10-22 DIAGNOSIS — Z96651 Presence of right artificial knee joint: Secondary | ICD-10-CM | POA: Diagnosis not present

## 2020-10-22 DIAGNOSIS — G2581 Restless legs syndrome: Secondary | ICD-10-CM | POA: Diagnosis not present

## 2020-10-22 DIAGNOSIS — K5900 Constipation, unspecified: Secondary | ICD-10-CM | POA: Diagnosis not present

## 2020-10-22 DIAGNOSIS — I10 Essential (primary) hypertension: Secondary | ICD-10-CM | POA: Diagnosis not present

## 2020-10-22 DIAGNOSIS — G4733 Obstructive sleep apnea (adult) (pediatric): Secondary | ICD-10-CM | POA: Diagnosis not present

## 2020-10-22 DIAGNOSIS — N281 Cyst of kidney, acquired: Secondary | ICD-10-CM | POA: Diagnosis not present

## 2020-10-22 DIAGNOSIS — Z471 Aftercare following joint replacement surgery: Secondary | ICD-10-CM | POA: Diagnosis not present

## 2020-10-24 ENCOUNTER — Encounter: Payer: Self-pay | Admitting: Family Medicine

## 2020-10-24 DIAGNOSIS — I10 Essential (primary) hypertension: Secondary | ICD-10-CM | POA: Diagnosis not present

## 2020-10-24 DIAGNOSIS — Z471 Aftercare following joint replacement surgery: Secondary | ICD-10-CM | POA: Diagnosis not present

## 2020-10-24 DIAGNOSIS — K5900 Constipation, unspecified: Secondary | ICD-10-CM | POA: Diagnosis not present

## 2020-10-24 DIAGNOSIS — F411 Generalized anxiety disorder: Secondary | ICD-10-CM | POA: Diagnosis not present

## 2020-10-24 DIAGNOSIS — D72819 Decreased white blood cell count, unspecified: Secondary | ICD-10-CM | POA: Diagnosis not present

## 2020-10-24 DIAGNOSIS — N200 Calculus of kidney: Secondary | ICD-10-CM | POA: Diagnosis not present

## 2020-10-24 DIAGNOSIS — N281 Cyst of kidney, acquired: Secondary | ICD-10-CM | POA: Diagnosis not present

## 2020-10-25 ENCOUNTER — Other Ambulatory Visit: Payer: Self-pay | Admitting: Family Medicine

## 2020-10-25 DIAGNOSIS — B373 Candidiasis of vulva and vagina: Secondary | ICD-10-CM

## 2020-10-25 DIAGNOSIS — B3731 Acute candidiasis of vulva and vagina: Secondary | ICD-10-CM

## 2020-10-25 MED ORDER — FLUCONAZOLE 150 MG PO TABS: 150.0000 mg | ORAL_TABLET | ORAL | 0 refills | Status: DC

## 2020-10-26 DIAGNOSIS — N281 Cyst of kidney, acquired: Secondary | ICD-10-CM | POA: Diagnosis not present

## 2020-10-26 DIAGNOSIS — D72819 Decreased white blood cell count, unspecified: Secondary | ICD-10-CM | POA: Diagnosis not present

## 2020-10-26 DIAGNOSIS — I10 Essential (primary) hypertension: Secondary | ICD-10-CM | POA: Diagnosis not present

## 2020-10-26 DIAGNOSIS — Z471 Aftercare following joint replacement surgery: Secondary | ICD-10-CM | POA: Diagnosis not present

## 2020-10-26 DIAGNOSIS — N200 Calculus of kidney: Secondary | ICD-10-CM | POA: Diagnosis not present

## 2020-10-26 DIAGNOSIS — K5900 Constipation, unspecified: Secondary | ICD-10-CM | POA: Diagnosis not present

## 2020-10-27 DIAGNOSIS — N281 Cyst of kidney, acquired: Secondary | ICD-10-CM | POA: Diagnosis not present

## 2020-10-27 DIAGNOSIS — Z471 Aftercare following joint replacement surgery: Secondary | ICD-10-CM | POA: Diagnosis not present

## 2020-10-27 DIAGNOSIS — D72819 Decreased white blood cell count, unspecified: Secondary | ICD-10-CM | POA: Diagnosis not present

## 2020-10-27 DIAGNOSIS — N200 Calculus of kidney: Secondary | ICD-10-CM | POA: Diagnosis not present

## 2020-10-27 DIAGNOSIS — I10 Essential (primary) hypertension: Secondary | ICD-10-CM | POA: Diagnosis not present

## 2020-10-27 DIAGNOSIS — K5900 Constipation, unspecified: Secondary | ICD-10-CM | POA: Diagnosis not present

## 2020-10-29 ENCOUNTER — Emergency Department: Payer: Medicare Other

## 2020-10-29 ENCOUNTER — Encounter: Payer: Self-pay | Admitting: Emergency Medicine

## 2020-10-29 ENCOUNTER — Emergency Department
Admission: EM | Admit: 2020-10-29 | Discharge: 2020-10-29 | Disposition: A | Discharge status: Home / Self Care | Payer: Medicare Other | Attending: Emergency Medicine | Admitting: Emergency Medicine

## 2020-10-29 ENCOUNTER — Other Ambulatory Visit: Payer: Self-pay

## 2020-10-29 DIAGNOSIS — I129 Hypertensive chronic kidney disease with stage 1 through stage 4 chronic kidney disease, or unspecified chronic kidney disease: Secondary | ICD-10-CM | POA: Diagnosis not present

## 2020-10-29 DIAGNOSIS — R000 Tachycardia, unspecified: Secondary | ICD-10-CM | POA: Insufficient documentation

## 2020-10-29 DIAGNOSIS — Z79899 Other long term (current) drug therapy: Secondary | ICD-10-CM | POA: Insufficient documentation

## 2020-10-29 DIAGNOSIS — N1832 Chronic kidney disease, stage 3b: Secondary | ICD-10-CM | POA: Insufficient documentation

## 2020-10-29 DIAGNOSIS — R42 Dizziness and giddiness: Secondary | ICD-10-CM | POA: Diagnosis not present

## 2020-10-29 DIAGNOSIS — Z8616 Personal history of COVID-19: Secondary | ICD-10-CM | POA: Insufficient documentation

## 2020-10-29 DIAGNOSIS — K5900 Constipation, unspecified: Secondary | ICD-10-CM | POA: Diagnosis not present

## 2020-10-29 DIAGNOSIS — Z96651 Presence of right artificial knee joint: Secondary | ICD-10-CM | POA: Diagnosis not present

## 2020-10-29 DIAGNOSIS — Z7901 Long term (current) use of anticoagulants: Secondary | ICD-10-CM | POA: Insufficient documentation

## 2020-10-29 MED ORDER — FLEET ENEMA 7-19 GM/118ML RE ENEM
1.0000 | ENEMA | Freq: Once | RECTAL | Status: AC
  Administered 2020-10-29: 1 via RECTAL

## 2020-10-29 MED ORDER — DOCUSATE SODIUM 50 MG/5ML PO LIQD
100.0000 mg | Freq: Once | ORAL | Status: AC
  Administered 2020-10-29: 100 mg
  Filled 2020-10-29: qty 10

## 2020-10-29 MED ORDER — MAGNESIUM CITRATE PO SOLN
60.0000 mL | Freq: Once | ORAL | Status: AC
  Administered 2020-10-29: 60 mL via ORAL
  Filled 2020-10-29: qty 296

## 2020-10-29 MED ORDER — MINERAL OIL RE ENEM
1.0000 | ENEMA | Freq: Once | RECTAL | Status: AC
  Administered 2020-10-29: 1 via RECTAL

## 2020-10-29 NOTE — ED Triage Notes (Signed)
Pt to ED via POV with c/o constipation, pt states had knee surgery on 3/2, states last BM several days after that, pt states has not had a good BM since then. Pt states "a little bit but not enough to to call it a bowel movement". Pt states has been taking laxatives, endorses that she is still passing gas at this time.

## 2020-10-29 NOTE — Discharge Instructions (Signed)
Take 17 mg of Miralax once daily.

## 2020-10-29 NOTE — ED Provider Notes (Signed)
[facility]-EMERGENCY DEPARTMENT  ____________________________________________  Time seen: Approximately 3:26 PM  I have reviewed the triage vital signs and the nursing notes.   HISTORY  Chief Complaint Constipation   Historian Patient     HPI Melissa [name] is a 70 y.o. female presents to the emergency department with concern for constipation.  Patient had a total knee replacement by Dr. Marry Guan on 10/18/2020.  Patient states that she has had a bowel movement since her surgery occurred and has been having several small bowel movements but states it does not feel normal to her.  She states that she has a constant urge to produce a bowel movement but states that she has had to strain and it makes her lightheaded.  She denies abdominal discomfort.   Past Medical History:  Diagnosis Date  . Aortic atherosclerosis ([place])   . Bruising   . Cardiomyopathy ([place])   . 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 Cervical radiculopathy   . CKD (chronic kidney disease), stage III ([place])   . Deficiency of vitamin B   . Depression   . Edema   . Flank pain   . Gross hematuria   . History of 2019 novel coronavirus disease (COVID-19) 05/01/2019  . History of bariatric surgery   . HLD (hyperlipidemia)   . HTN (hypertension)   . Interstitial lung disease ([place])   . Iron deficiency anemia   . Leukopenia   . OA (osteoarthritis)   . Obesity   . OSA on CPAP   . Osteopenia   . Perennial allergic rhinitis   . Pneumonia due to COVID-19 virus 2020  . Renal cysts, acquired, bilateral   . RLS (restless legs syndrome)   . Sarcoidosis   . Sickle cell trait ([place])   . Snoring   . Spinal stenosis   . Trigeminal neuralgia      Immunizations up to date:  Yes.     Past Medical History:  Diagnosis Date  . Aortic atherosclerosis ([place])   . Bruising   . Cardiomyopathy ([place])   . 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 Cervical radiculopathy   . CKD (chronic kidney disease), stage III ([place])   . Deficiency of vitamin  B   . Depression   . Edema   . Flank pain   . Gross hematuria   . History of 2019 novel coronavirus disease (COVID-19) 05/01/2019  . History of bariatric surgery   . HLD (hyperlipidemia)   . HTN (hypertension)   . Interstitial lung disease ([place])   . Iron deficiency anemia   . Leukopenia   . OA (osteoarthritis)   . Obesity   . OSA on CPAP   . Osteopenia   . Perennial allergic rhinitis   . Pneumonia due to COVID-19 virus 2020  . Renal cysts, acquired, bilateral   . RLS (restless legs syndrome)   . Sarcoidosis   . Sickle cell trait ([place])   . Snoring   . Spinal stenosis   . Trigeminal neuralgia     Patient Active Problem List   Diagnosis Date Noted  . Total knee replacement status 10/18/2020  . Primary osteoarthritis of right hip 08/06/2020  . Sarcoidosis 05/29/2020  . Aneurysm of celiac artery ([place]) 02/11/2020  . COVID-19 virus infection 09/06/2019  . Localized swelling of right lower leg 09/06/2019  . OSA on CPAP 09/06/2019  . Secondary hyperparathyroidism of renal origin ([place]) 07/21/2019  . Anemia in chronic kidney disease 06/14/2019  . Benign hypertensive kidney disease with chronic  kidney disease 06/14/2019  . Sickle cell trait ([place]) 06/14/2019  . Chronic kidney disease, stage 3b ([place]) 06/14/2019  . Chronic bronchitis ([place]) 05/19/2019  . Chronic venous insufficiency 04/15/2019  . Lymphedema 04/15/2019  . Swelling of limb 03/30/2019  . Family history of colon cancer   . Trigeminal neuralgia 09/11/2018  . Cardiac LV ejection fraction of 40-49% 05/07/2017  . Low back pain without sciatica 02/21/2017  . Renal cyst 10/18/2016  . Atherosclerosis of aorta ([place]) 10/16/2016  . History of colonic polyps 09/25/2016  . Dyslipidemia 12/08/2015  . Gastroesophageal reflux disease without esophagitis 08/11/2015  . Primary osteoarthritis of knee 06/13/2015  . Hip bursitis 06/13/2015  . RLS (restless legs syndrome) 06/13/2015  . Depression, major, in remission ([place]) 06/13/2015   . B12 deficiency 06/13/2015  . History of Roux-en-Y gastric bypass 06/13/2015  . History of iron deficiency anemia 06/13/2015  . Vitamin D deficiency 06/13/2015  . Hypertension, benign 06/13/2015  . Perennial allergic rhinitis with seasonal variation 06/13/2015  . Obesity (BMI 30.0-34.9) 06/13/2015  . Primary osteoarthritis of both knees 06/13/2015  . Cervical radiculitis 01/04/2014  . Cervical spinal stenosis 01/04/2014  . Cervical osteoarthritis 01/04/2014    Past Surgical History:  Procedure Laterality Date  . BARIATRIC SURGERY    . CESAREAN SECTION     3 or more  . COLONOSCOPY WITH PROPOFOL N/A 03/15/2019   Procedure: COLONOSCOPY WITH PROPOFOL;  Surgeon: [name], MD;  Location: [facility] ENDOSCOPY;  Service: Gastroenterology;  Laterality: N/A;  . GASTRIC BYPASS    . KNEE ARTHROPLASTY Right 10/18/2020   Procedure: COMPUTER ASSISTED TOTAL KNEE ARTHROPLASTY;  Surgeon: Dereck Leep, MD;  Location: [facility] ORS;  Service: Orthopedics;  Laterality: Right;  . SHOULDER ARTHROSCOPY Right   . TUBAL LIGATION      Prior to Admission medications   Medication Sig Start Date End Date Taking? Authorizing Provider  acetaminophen (TYLENOL) 650 MG CR tablet Take 1,300 mg by mouth every 8 (eight) hours as needed for pain.    [provider]  albuterol (VENTOLIN HFA) 108 (90 Base) MCG/ACT inhaler Inhale 2 puffs into the lungs every 6 (six) hours as needed for wheezing or shortness of breath. Patient not taking: Reported on 10/18/2020 05/03/19   Steele Sizer, MD  Ascorbic Acid (VITAMIN C) 1000 MG tablet Take 1,000 mg by mouth daily.    [provider]  atorvastatin (LIPITOR) 40 MG tablet Take 1 tablet (40 mg total) by mouth daily. 02/15/20   Steele Sizer, MD  azelastine (ASTELIN) 0.1 % nasal spray 2 SPRAYS IN EACH NOSTRIL DAILY Patient taking differently: Place 2 sprays into both nostrils daily. 12/24/19   Sowles, Drue Stager, MD  budesonide (PULMICORT) 0.5 MG/2ML nebulizer  solution Take 2 mLs (0.5 mg total) by nebulization in the morning and at bedtime. 06/23/20   Steele Sizer, MD  calcium carbonate (TUMS - DOSED IN MG ELEMENTAL CALCIUM) 500 MG chewable tablet Chew 500-1,000 mg by mouth daily as needed for indigestion or heartburn.    [provider]  carbamazepine (TEGRETOL) 200 MG tablet Take 200 mg by mouth 3 (three) times daily as needed (trigeminal neuralgia). Patient not taking: Reported on 10/18/2020    [provider]  celecoxib (CELEBREX) 200 MG capsule Take 1 capsule (200 mg total) by mouth 2 (two) times daily. 10/20/20   Fausto Skillern, [name]  Docusate Sodium (DSS) 100 MG CAPS Take 1 capsule by mouth 2 (two) times daily as needed for constipation.    [provider]  enoxaparin (LOVENOX) 40 MG/0.4ML injection Inject 0.4 mLs (40 mg total) into the skin daily for 14 days. 10/20/20 11/03/20  Tamala Julian B, [name]  ferrous sulfate 325 (65 FE) MG tablet Take 325 mg by mouth daily with breakfast.    [provider]  fluconazole (DIFLUCAN) 150 MG tablet Take 1 tablet (150 mg total) by mouth every other day. 10/25/20   Steele Sizer, MD  fluticasone (FLONASE) 50 MCG/ACT nasal spray 2 sprays to each nostril once daily.  Can use twice daily for the first 3 days Patient taking differently: Place 2 sprays into both nostrils daily. 05/29/20   Towanda Malkin, MD  gabapentin (NEURONTIN) 100 MG capsule Take 100 mg by mouth at bedtime as needed (pain). Patient not taking: Reported on 10/18/2020 03/09/20   [provider]  ipratropium-albuterol (DUONEB) 0.5-2.5 (3) MG/3ML SOLN Take 3 mLs by nebulization every 6 (six) hours as needed. Patient not taking: Reported on 10/18/2020 06/23/20   Steele Sizer, MD  linaclotide Rolan Lipa) 72 MCG capsule Take 1 capsule (72 mcg total) by mouth daily before breakfast. 05/29/20   Lucilla Lame, MD  loratadine (CLARITIN) 10 MG tablet Take 1 tablet (10 mg total) by mouth daily. 10/02/18   Steele Sizer, MD  losartan (COZAAR) 25 MG tablet TAKE 1 TABLET BY MOUTH EVERY DAY Patient taking differently: Take 25 mg by mouth daily. 02/28/20   Steele Sizer, MD  magnesium oxide (MAG-OX) 400 MG tablet Take 400 mg by mouth daily.    [provider]  Multiple Vitamins-Minerals (CENTRUM SILVER) tablet Take 1 tablet by mouth daily. 07/02/18   Steele Sizer, MD  oxyCODONE (OXY IR/ROXICODONE) 5 MG immediate release tablet Take 1 tablet (5 mg total) by mouth every 4 (four) hours as needed for moderate pain (pain score 4-6). 10/20/20   Tamala Julian B, [name]  zinc gluconate 50 MG tablet Take 50 mg by mouth daily.    [provider]    Allergies Contrast media [iodinated diagnostic agents] and Shellfish allergy  Family History  Problem Relation Age of Onset  . Hypercholesterolemia Mother   . Heart disease Mother   . Hypertension Mother   . Alcohol abuse Father   . Lung cancer Brother   . Alcohol abuse Brother   . Diabetes Mellitus II Sister   . Hypertension Maternal Grandmother   . Colon cancer Sister   . Breast cancer Neg Hx   . Prostate cancer Neg Hx   . Bladder Cancer Neg Hx   . Kidney cancer Neg Hx     Social History Social History   Tobacco Use  . Smoking status: Never Smoker  . Smokeless tobacco: Never Used  . Tobacco comment: Second hand smoke exposure  Vaping Use  . Vaping Use: Never used  Substance Use Topics  . Alcohol use: Not Currently    Alcohol/week: 0.0 standard drinks  . Drug use: No     Review of Systems  Constitutional: No fever/chills Eyes:  No discharge ENT: No upper respiratory complaints. Respiratory: no cough. No SOB/ use of accessory muscles to breath Gastrointestinal:   No nausea, no vomiting.  No diarrhea.  No constipation. Musculoskeletal: Negative for musculoskeletal pain. Skin: Negative for rash, abrasions, lacerations, ecchymosis.    ____________________________________________   PHYSICAL EXAM:  VITAL SIGNS: ED  Triage Vitals [10/29/20 1451]  Enc Vitals Group     BP 120/77     Pulse Rate (!) 104     Resp 20     Temp 98.2  F (36.8 C)     Temp Source Oral     SpO2 100 %     Weight 208 lb (94.3 kg)     Height 5\' 11"  (1.803 m)     Head Circumference      Peak Flow      Pain Score 3     Pain Loc      Pain Edu?      Excl. in [place]?      Constitutional: Alert and oriented. Well appearing and in no acute distress. Eyes: Conjunctivae are normal. PERRL. EOMI. Head: Atraumatic. ENT:      Nose: No congestion/rhinnorhea.      Mouth/Throat: Mucous membranes are moist.  Neck: No stridor.  No cervical spine tenderness to palpation. Cardiovascular: Normal rate, regular rhythm. Normal S1 and S2.  Good peripheral circulation. Respiratory: Normal respiratory effort without tachypnea or retractions. Lungs CTAB. Good air entry to the bases with no decreased or absent breath sounds Gastrointestinal: Bowel sounds x 4 quadrants. Soft and nontender to palpation. No guarding or rigidity. No distention. Musculoskeletal: Full range of motion to all extremities. No obvious deformities noted Neurologic:  Normal for age. No gross focal neurologic deficits are appreciated.  Skin:  Skin is warm, dry and intact. No rash noted. Psychiatric: Mood and affect are normal for age. Speech and behavior are normal.   ____________________________________________   LABS (all labs ordered are listed, but only abnormal results are displayed)  Labs Reviewed - No data to display ____________________________________________  EKG   ____________________________________________  RADIOLOGY Unk Pinto, personally viewed and evaluated these images (plain radiographs) as part of my medical decision making, as well as reviewing the written report by the radiologist.    DG Abdomen 1 View  Result Date: 10/29/2020 CLINICAL DATA:  Constipation. EXAM: ABDOMEN - 1 VIEW COMPARISON:  None. FINDINGS: The bowel gas pattern is normal.  A large amount of stool is seen within the ascending colon and distal sigmoid colon. Radiopaque surgical clips are seen overlying the expected region of the esophageal hiatus and lateral aspects of the mid abdomen, bilaterally. No radio-opaque calculi or other significant radiographic abnormality are seen. IMPRESSION: 1. Large stool burden without evidence of bowel obstruction. Electronically Signed   By: Virgina Norfolk M.D.   On: 10/29/2020 15:56    ____________________________________________    PROCEDURES  Procedure(s) performed:     Procedures     Medications  sodium phosphate (FLEET) 7-19 GM/118ML enema 1 enema (1 enema Rectal Given 10/29/20 1740)  docusate (COLACE) 50 MG/5ML liquid 100 mg (100 mg Per Tube Given 10/29/20 1738)  magnesium citrate solution 60 mL (60 mLs Oral Given 10/29/20 1739)  mineral oil enema 1 enema (1 enema Rectal Given 10/29/20 1740)     ____________________________________________   INITIAL IMPRESSION / ASSESSMENT AND PLAN / ED COURSE  Pertinent labs & imaging results that were available during my care of the patient were reviewed by me and considered in my medical decision making (see chart for details).      Assessment and Plan: Constipation:  70 year old female presents to the emergency department with abdominal discomfort and constipation after patient had a total knee arthroplasty.  Patient was mildly tachycardic at triage but vital signs were otherwise reassuring.  Abdomen was soft and nontender without guarding.  KUB revealed a large stool burden.  Patient received a pink lady enema in the emergency department produced a large bowel movement.  She reported that she felt much better.  ____________________________________________  FINAL CLINICAL IMPRESSION(S) / ED DIAGNOSES  Final diagnoses:  Constipation, unspecified constipation type      NEW MEDICATIONS STARTED DURING THIS VISIT:  ED Discharge Orders    None           This chart was dictated using voice recognition software/Dragon. Despite best efforts to proofread, errors can occur which can change the meaning. Any change was purely unintentional.     Lannie Fields, [name] 10/29/20 1851    Duffy Bruce, MD 10/30/20 [phone]

## 2020-10-29 NOTE — ED Notes (Signed)
Patient had XL BM post enema.

## 2020-10-30 DIAGNOSIS — I10 Essential (primary) hypertension: Secondary | ICD-10-CM | POA: Diagnosis not present

## 2020-10-30 DIAGNOSIS — D72819 Decreased white blood cell count, unspecified: Secondary | ICD-10-CM | POA: Diagnosis not present

## 2020-10-30 DIAGNOSIS — Z471 Aftercare following joint replacement surgery: Secondary | ICD-10-CM | POA: Diagnosis not present

## 2020-10-30 DIAGNOSIS — K5900 Constipation, unspecified: Secondary | ICD-10-CM | POA: Diagnosis not present

## 2020-10-30 DIAGNOSIS — N200 Calculus of kidney: Secondary | ICD-10-CM | POA: Diagnosis not present

## 2020-10-30 DIAGNOSIS — N281 Cyst of kidney, acquired: Secondary | ICD-10-CM | POA: Diagnosis not present

## 2020-10-31 ENCOUNTER — Encounter: Payer: Self-pay | Admitting: Family Medicine

## 2020-10-31 NOTE — Progress Notes (Signed)
Name: Melissa [name]   MRN: [id number]    DOB: [birth year]   Date:11/01/2020       Progress Note  Subjective  Chief Complaint  Insomnia   I connected with  [name]  on 11/01/20 at  1:00 PM EDT by a video enabled telemedicine application and verified that I am speaking with the correct person using two identifiers.  I discussed the limitations of evaluation and management by telemedicine and the availability of in person appointments. The patient expressed understanding and agreed to proceed with the virtual visit  Staff also discussed with the patient that there may be a patient responsible charge related to this service. Patient Location: at home  Provider Location: [facility] Additional Individuals present: son is at the house now   HPI  Insomnia: she states since right before her surgery she has not been able to sleep all night. She sleeps at most 3 hours per night. She had an episode like this before. She is feeling tired during the day but unable to nap. She denies agitation . She has tried Benadryl without help, she tried Tylenol pm with mild improvement. She states very interrupted. She states she tried using white noise sounds but did not work either. Melatonin also failed. She states in the past never lasted this long She is feeling exhausted now  Chronic constipation: she take Linzess daily but had knee replacement surgery on 10/18/2020 and had one bowel movement the day after surgery but nothing for over one week and even straining she could not have a bowel movement. She went to [facility] on 10/29/2020, she was given an enema and since than taking Linzess plus metamucil daily , since then she has normal bowel movements, no straining or blood in stools. We will try adjusting dose to 145 mg , she has samples at home and will try that first    Patient Active Problem List   Diagnosis Date Noted  . Total knee replacement status 10/18/2020  . Primary osteoarthritis of right hip 08/06/2020   . Sarcoidosis 05/29/2020  . Aneurysm of celiac artery ([place]) 02/11/2020  . COVID-19 virus infection 09/06/2019  . Localized swelling of right lower leg 09/06/2019  . OSA on CPAP 09/06/2019  . Secondary hyperparathyroidism of renal origin ([place]) 07/21/2019  . Anemia in chronic kidney disease 06/14/2019  . Benign hypertensive kidney disease with chronic kidney disease 06/14/2019  . Sickle cell trait ([place]) 06/14/2019  . Chronic kidney disease, stage 3b ([place]) 06/14/2019  . Chronic bronchitis ([place]) 05/19/2019  . Chronic venous insufficiency 04/15/2019  . Lymphedema 04/15/2019  . Swelling of limb 03/30/2019  . Family history of colon cancer   . Trigeminal neuralgia 09/11/2018  . Cardiac LV ejection fraction of 40-49% 05/07/2017  . Low back pain without sciatica 02/21/2017  . Renal cyst 10/18/2016  . Atherosclerosis of aorta ([place]) 10/16/2016  . History of colonic polyps 09/25/2016  . Dyslipidemia 12/08/2015  . Gastroesophageal reflux disease without esophagitis 08/11/2015  . Primary osteoarthritis of knee 06/13/2015  . Hip bursitis 06/13/2015  . RLS (restless legs syndrome) 06/13/2015  . Depression, major, in remission ([place]) 06/13/2015  . B12 deficiency 06/13/2015  . History of Roux-en-Y gastric bypass 06/13/2015  . History of iron deficiency anemia 06/13/2015  . Vitamin D deficiency 06/13/2015  . Hypertension, benign 06/13/2015  . Perennial allergic rhinitis with seasonal variation 06/13/2015  . Obesity (BMI 30.0-34.9) 06/13/2015  . Primary osteoarthritis of both knees 06/13/2015  . Cervical radiculitis 01/04/2014  .  Cervical spinal stenosis 01/04/2014  . Cervical osteoarthritis 01/04/2014    Past Surgical History:  Procedure Laterality Date  . BARIATRIC SURGERY    . CESAREAN SECTION     3 or more  . COLONOSCOPY WITH PROPOFOL N/A 03/15/2019   Procedure: COLONOSCOPY WITH PROPOFOL;  Surgeon: [name], MD;  Location: [facility] ENDOSCOPY;  Service: Gastroenterology;   Laterality: N/A;  . GASTRIC BYPASS    . KNEE ARTHROPLASTY Right 10/18/2020   Procedure: COMPUTER ASSISTED TOTAL KNEE ARTHROPLASTY;  Surgeon: Dereck Leep, MD;  Location: [facility] ORS;  Service: Orthopedics;  Laterality: Right;  . SHOULDER ARTHROSCOPY Right   . TUBAL LIGATION      Family History  Problem Relation Age of Onset  . Hypercholesterolemia Mother   . Heart disease Mother   . Hypertension Mother   . Alcohol abuse Father   . Lung cancer Brother   . Alcohol abuse Brother   . Diabetes Mellitus II Sister   . Hypertension Maternal Grandmother   . Colon cancer Sister   . Breast cancer Neg Hx   . Prostate cancer Neg Hx   . Bladder Cancer Neg Hx   . Kidney cancer Neg Hx     Social History   Socioeconomic History  . Marital status: Widowed    Spouse name: Jemya Depierro  . Number of children: 4  . Years of education: Not on file  . Highest education level: Some college, no degree  Occupational History    Comment: retired  Tobacco Use  . Smoking status: Never Smoker  . Smokeless tobacco: Never Used  . Tobacco comment: Second hand smoke exposure  Vaping Use  . Vaping Use: Never used  Substance and Sexual Activity  . Alcohol use: Not Currently    Alcohol/week: 0.0 standard drinks  . Drug use: No  . Sexual activity: Yes    Partners: Male    Birth control/protection: Post-menopausal  Other Topics Concern  . Not on file  Social History Narrative   Pt lives her son    Social Determinants of Health   Financial Resource Strain: Low Risk   . Difficulty of Paying Living Expenses: Not hard at all  Food Insecurity: No Food Insecurity  . Worried About Charity fundraiser in the Last Year: Never true  . Ran Out of Food in the Last Year: Never true  Transportation Needs: No Transportation Needs  . Lack of Transportation (Medical): No  . Lack of Transportation (Non-Medical): No  Physical Activity: Sufficiently Active  . Days of Exercise per Week: 2 days  . Minutes of Exercise  per Session: 90 min  Stress: No Stress Concern Present  . Feeling of Stress : Not at all  Social Connections: Socially Isolated  . Frequency of Communication with Friends and Family: More than three times a week  . Frequency of Social Gatherings with Friends and Family: More than three times a week  . Attends Religious Services: Never  . Active Member of Clubs or Organizations: No  . Attends Archivist Meetings: Never  . Marital Status: Widowed  Intimate Partner Violence: Not At Risk  . Fear of Current or Ex-Partner: No  . Emotionally Abused: No  . Physically Abused: No  . Sexually Abused: No     Current Outpatient Medications:  .  acetaminophen (TYLENOL) 650 MG CR tablet, Take 1,300 mg by mouth every 8 (eight) hours as needed for pain., Disp: , Rfl:  .  Ascorbic Acid (VITAMIN C) 1000 MG  tablet, Take 1,000 mg by mouth daily., Disp: , Rfl:  .  atorvastatin (LIPITOR) 40 MG tablet, Take 1 tablet (40 mg total) by mouth daily., Disp: 90 tablet, Rfl: 1 .  azelastine (ASTELIN) 0.1 % nasal spray, 2 SPRAYS IN EACH NOSTRIL DAILY (Patient taking differently: Place 2 sprays into both nostrils daily.), Disp: 90 mL, Rfl: 0 .  budesonide (PULMICORT) 0.5 MG/2ML nebulizer solution, Take 2 mLs (0.5 mg total) by nebulization in the morning and at bedtime., Disp: 120 mL, Rfl: 1 .  calcium carbonate (TUMS - DOSED IN MG ELEMENTAL CALCIUM) 500 MG chewable tablet, Chew 500-1,000 mg by mouth daily as needed for indigestion or heartburn., Disp: , Rfl:  .  celecoxib (CELEBREX) 200 MG capsule, Take 1 capsule (200 mg total) by mouth 2 (two) times daily., Disp: 90 capsule, Rfl: 0 .  Docusate Sodium (DSS) 100 MG CAPS, Take 1 capsule by mouth 2 (two) times daily as needed for constipation., Disp: , Rfl:  .  enoxaparin (LOVENOX) 40 MG/0.4ML injection, Inject 0.4 mLs (40 mg total) into the skin daily for 14 days., Disp: 5.6 mL, Rfl: 0 .  ferrous sulfate 325 (65 FE) MG tablet, Take 325 mg by mouth daily with  breakfast., Disp: , Rfl:  .  fluticasone (FLONASE) 50 MCG/ACT nasal spray, 2 sprays to each nostril once daily.  Can use twice daily for the first 3 days (Patient taking differently: Place 2 sprays into both nostrils daily.), Disp: 16 g, Rfl: 1 .  linaclotide (LINZESS) 72 MCG capsule, Take 1 capsule (72 mcg total) by mouth daily before breakfast., Disp: 30 capsule, Rfl: 6 .  loratadine (CLARITIN) 10 MG tablet, Take 1 tablet (10 mg total) by mouth daily., Disp: 90 tablet, Rfl: 0 .  losartan (COZAAR) 25 MG tablet, TAKE 1 TABLET BY MOUTH EVERY DAY (Patient taking differently: Take 25 mg by mouth daily.), Disp: 90 tablet, Rfl: 3 .  magnesium oxide (MAG-OX) 400 MG tablet, Take 400 mg by mouth daily., Disp: , Rfl:  .  Multiple Vitamins-Minerals (CENTRUM SILVER) tablet, Take 1 tablet by mouth daily., Disp: 30 tablet, Rfl: 0 .  oxyCODONE (OXY IR/ROXICODONE) 5 MG immediate release tablet, Take 1 tablet (5 mg total) by mouth every 4 (four) hours as needed for moderate pain (pain score 4-6)., Disp: 30 tablet, Rfl: 0 .  traZODone (DESYREL) 50 MG tablet, Take 0.5-2 tablets (25-100 mg total) by mouth at bedtime as needed for sleep., Disp: 30 tablet, Rfl: 0 .  zinc gluconate 50 MG tablet, Take 50 mg by mouth daily., Disp: , Rfl:  .  albuterol (VENTOLIN HFA) 108 (90 Base) MCG/ACT inhaler, Inhale 2 puffs into the lungs every 6 (six) hours as needed for wheezing or shortness of breath. (Patient not taking: No sig reported), Disp: 18 g, Rfl: 0 .  carbamazepine (TEGRETOL) 200 MG tablet, Take 200 mg by mouth 3 (three) times daily as needed (trigeminal neuralgia). (Patient not taking: No sig reported), Disp: , Rfl:  .  gabapentin (NEURONTIN) 100 MG capsule, Take 100 mg by mouth at bedtime as needed (pain). (Patient not taking: No sig reported), Disp: , Rfl:  .  ipratropium-albuterol (DUONEB) 0.5-2.5 (3) MG/3ML SOLN, Take 3 mLs by nebulization every 6 (six) hours as needed. (Patient not taking: No sig reported), Disp: 360 mL,  Rfl: 0  Allergies  Allergen Reactions  . Contrast Media [Iodinated Diagnostic Agents]     Unknown reaction  . Shellfish Allergy     Edema    I personally  reviewed active problem list, medication list, allergies, family history, social history, health maintenance with the patient/caregiver today.   ROS  Ten systems reviewed and is negative except as mentioned in HPI   Objective  Virtual encounter, vitals not obtained.  Body mass index is 29.01 kg/m.  Physical Exam  Awake, alert and oriented  PHQ2/9: Depression screen [facility] 2/9 11/01/2020 10/12/2020 08/31/2020 07/18/2020 06/23/2020  Decreased Interest 0 0 0 0 0  Down, Depressed, Hopeless 0 0 0 0 0  PHQ - 2 Score 0 0 0 0 0  Altered sleeping - - 1 - -  Tired, decreased energy - - 0 - -  Change in appetite - - 0 - -  Feeling bad or failure about yourself  - - 0 - -  Trouble concentrating - - 0 - -  Moving slowly or fidgety/restless - - 0 - -  Suicidal thoughts - - 0 - -  PHQ-9 Score - - 1 - -  Difficult doing work/chores - - Not difficult at all - -  Some recent data might be hidden   PHQ-2/9 Result is negative.    Fall Risk: Fall Risk  11/01/2020 10/12/2020 08/31/2020 07/18/2020 06/23/2020  Falls in the past year? 1 1 0 1 0  Comment - - - - -  Number falls in past yr: 0 0 0 0 0  Injury with Fall? 0 0 0 0 0  Comment - - - - -  Risk for fall due to : History of fall(s) No Fall Risks - - -  Follow up Falls prevention discussed Falls prevention discussed - - -    Assessment & Plan  1. Insomnia, unspecified type  - traZODone (DESYREL) 50 MG tablet; Take 0.5-2 tablets (25-100 mg total) by mouth at bedtime as needed for sleep.  Dispense: 30 tablet; Refill: 0  2. Chronic idiopathic constipation  She will try 145 mg of Linzess   I discussed the assessment and treatment plan with the patient. The patient was provided an opportunity to ask questions and all were answered. The patient agreed with the plan and demonstrated an  understanding of the instructions.  The patient was advised to call back or seek an in-person evaluation if the symptoms worsen or if the condition fails to improve as anticipated.  I provided 15  minutes of non-face-to-face time during this encounter.

## 2020-11-01 ENCOUNTER — Encounter: Payer: Self-pay | Admitting: Family Medicine

## 2020-11-01 ENCOUNTER — Telehealth (INDEPENDENT_AMBULATORY_CARE_PROVIDER_SITE_OTHER): Payer: Medicare Other | Admitting: Family Medicine

## 2020-11-01 VITALS — Ht 71.0 in | Wt 208.0 lb

## 2020-11-01 DIAGNOSIS — Z471 Aftercare following joint replacement surgery: Secondary | ICD-10-CM | POA: Diagnosis not present

## 2020-11-01 DIAGNOSIS — K5904 Chronic idiopathic constipation: Secondary | ICD-10-CM

## 2020-11-01 DIAGNOSIS — N281 Cyst of kidney, acquired: Secondary | ICD-10-CM | POA: Diagnosis not present

## 2020-11-01 DIAGNOSIS — G4700 Insomnia, unspecified: Secondary | ICD-10-CM

## 2020-11-01 DIAGNOSIS — K5900 Constipation, unspecified: Secondary | ICD-10-CM | POA: Diagnosis not present

## 2020-11-01 DIAGNOSIS — I10 Essential (primary) hypertension: Secondary | ICD-10-CM | POA: Diagnosis not present

## 2020-11-01 DIAGNOSIS — D72819 Decreased white blood cell count, unspecified: Secondary | ICD-10-CM | POA: Diagnosis not present

## 2020-11-01 DIAGNOSIS — N200 Calculus of kidney: Secondary | ICD-10-CM | POA: Diagnosis not present

## 2020-11-01 MED ORDER — TRAZODONE HCL 50 MG PO TABS: 25.0000 mg | ORAL_TABLET | Freq: Every evening | ORAL | 0 refills | Status: DC | PRN

## 2020-11-02 ENCOUNTER — Encounter: Payer: Self-pay | Admitting: Family Medicine

## 2020-11-02 ENCOUNTER — Other Ambulatory Visit: Payer: Self-pay | Admitting: Family Medicine

## 2020-11-02 DIAGNOSIS — M25661 Stiffness of right knee, not elsewhere classified: Secondary | ICD-10-CM | POA: Diagnosis not present

## 2020-11-02 DIAGNOSIS — M6281 Muscle weakness (generalized): Secondary | ICD-10-CM | POA: Diagnosis not present

## 2020-11-02 DIAGNOSIS — Z96651 Presence of right artificial knee joint: Secondary | ICD-10-CM | POA: Diagnosis not present

## 2020-11-02 DIAGNOSIS — G8929 Other chronic pain: Secondary | ICD-10-CM | POA: Diagnosis not present

## 2020-11-02 DIAGNOSIS — M25561 Pain in right knee: Secondary | ICD-10-CM | POA: Diagnosis not present

## 2020-11-02 MED ORDER — QUETIAPINE FUMARATE 25 MG PO TABS: 25.0000 mg | ORAL_TABLET | Freq: Every day | ORAL | 0 refills | Status: DC

## 2020-11-02 NOTE — Progress Notes (Unsigned)
eroquel

## 2020-11-06 DIAGNOSIS — M6281 Muscle weakness (generalized): Secondary | ICD-10-CM | POA: Diagnosis not present

## 2020-11-06 DIAGNOSIS — G8929 Other chronic pain: Secondary | ICD-10-CM | POA: Diagnosis not present

## 2020-11-06 DIAGNOSIS — M25561 Pain in right knee: Secondary | ICD-10-CM | POA: Diagnosis not present

## 2020-11-06 DIAGNOSIS — Z96651 Presence of right artificial knee joint: Secondary | ICD-10-CM | POA: Diagnosis not present

## 2020-11-06 DIAGNOSIS — M25661 Stiffness of right knee, not elsewhere classified: Secondary | ICD-10-CM | POA: Diagnosis not present

## 2020-11-07 DIAGNOSIS — F411 Generalized anxiety disorder: Secondary | ICD-10-CM | POA: Diagnosis not present

## 2020-11-10 DIAGNOSIS — G8929 Other chronic pain: Secondary | ICD-10-CM | POA: Diagnosis not present

## 2020-11-10 DIAGNOSIS — M6281 Muscle weakness (generalized): Secondary | ICD-10-CM | POA: Diagnosis not present

## 2020-11-10 DIAGNOSIS — M25661 Stiffness of right knee, not elsewhere classified: Secondary | ICD-10-CM | POA: Diagnosis not present

## 2020-11-10 DIAGNOSIS — M25561 Pain in right knee: Secondary | ICD-10-CM | POA: Diagnosis not present

## 2020-11-10 DIAGNOSIS — Z96651 Presence of right artificial knee joint: Secondary | ICD-10-CM | POA: Diagnosis not present

## 2020-11-13 DIAGNOSIS — G8929 Other chronic pain: Secondary | ICD-10-CM | POA: Diagnosis not present

## 2020-11-13 DIAGNOSIS — Z96651 Presence of right artificial knee joint: Secondary | ICD-10-CM | POA: Diagnosis not present

## 2020-11-13 DIAGNOSIS — M6281 Muscle weakness (generalized): Secondary | ICD-10-CM | POA: Diagnosis not present

## 2020-11-13 DIAGNOSIS — M25661 Stiffness of right knee, not elsewhere classified: Secondary | ICD-10-CM | POA: Diagnosis not present

## 2020-11-13 DIAGNOSIS — M25561 Pain in right knee: Secondary | ICD-10-CM | POA: Diagnosis not present

## 2020-11-14 ENCOUNTER — Other Ambulatory Visit: Payer: Self-pay | Admitting: Family Medicine

## 2020-11-14 DIAGNOSIS — I700 Atherosclerosis of aorta: Secondary | ICD-10-CM

## 2020-11-14 NOTE — Telephone Encounter (Signed)
Requested medications are due for refill today yes  Requested medications are on the active medication list yes  Last refill 08/12/20  Last visit Do not see a visit where this dx/med was addressed, did see Dr. Rockey Situ 09/27/20, labs 03/2020.  Future visit scheduled no  Notes to clinic Please assess.

## 2020-11-15 DIAGNOSIS — G8929 Other chronic pain: Secondary | ICD-10-CM | POA: Diagnosis not present

## 2020-11-15 DIAGNOSIS — M6281 Muscle weakness (generalized): Secondary | ICD-10-CM | POA: Diagnosis not present

## 2020-11-15 DIAGNOSIS — M25661 Stiffness of right knee, not elsewhere classified: Secondary | ICD-10-CM | POA: Diagnosis not present

## 2020-11-15 DIAGNOSIS — M25561 Pain in right knee: Secondary | ICD-10-CM | POA: Diagnosis not present

## 2020-11-15 DIAGNOSIS — Z96651 Presence of right artificial knee joint: Secondary | ICD-10-CM | POA: Diagnosis not present

## 2020-11-17 DIAGNOSIS — Z96651 Presence of right artificial knee joint: Secondary | ICD-10-CM | POA: Diagnosis not present

## 2020-11-17 DIAGNOSIS — M6281 Muscle weakness (generalized): Secondary | ICD-10-CM | POA: Diagnosis not present

## 2020-11-17 DIAGNOSIS — M25561 Pain in right knee: Secondary | ICD-10-CM | POA: Diagnosis not present

## 2020-11-17 DIAGNOSIS — G8929 Other chronic pain: Secondary | ICD-10-CM | POA: Diagnosis not present

## 2020-11-17 DIAGNOSIS — Z471 Aftercare following joint replacement surgery: Secondary | ICD-10-CM | POA: Diagnosis not present

## 2020-11-17 DIAGNOSIS — M25661 Stiffness of right knee, not elsewhere classified: Secondary | ICD-10-CM | POA: Diagnosis not present

## 2020-11-20 DIAGNOSIS — Z96651 Presence of right artificial knee joint: Secondary | ICD-10-CM | POA: Diagnosis not present

## 2020-11-20 DIAGNOSIS — G8929 Other chronic pain: Secondary | ICD-10-CM | POA: Diagnosis not present

## 2020-11-20 DIAGNOSIS — M25661 Stiffness of right knee, not elsewhere classified: Secondary | ICD-10-CM | POA: Diagnosis not present

## 2020-11-20 DIAGNOSIS — M6281 Muscle weakness (generalized): Secondary | ICD-10-CM | POA: Diagnosis not present

## 2020-11-20 DIAGNOSIS — M25561 Pain in right knee: Secondary | ICD-10-CM | POA: Diagnosis not present

## 2020-11-22 DIAGNOSIS — Z96651 Presence of right artificial knee joint: Secondary | ICD-10-CM | POA: Diagnosis not present

## 2020-11-22 DIAGNOSIS — M25561 Pain in right knee: Secondary | ICD-10-CM | POA: Diagnosis not present

## 2020-11-22 DIAGNOSIS — G8929 Other chronic pain: Secondary | ICD-10-CM | POA: Diagnosis not present

## 2020-11-22 DIAGNOSIS — M6281 Muscle weakness (generalized): Secondary | ICD-10-CM | POA: Diagnosis not present

## 2020-11-24 ENCOUNTER — Other Ambulatory Visit: Payer: Self-pay | Admitting: Family Medicine

## 2020-11-24 DIAGNOSIS — M6281 Muscle weakness (generalized): Secondary | ICD-10-CM | POA: Diagnosis not present

## 2020-11-24 DIAGNOSIS — Z96651 Presence of right artificial knee joint: Secondary | ICD-10-CM | POA: Diagnosis not present

## 2020-11-24 DIAGNOSIS — G8929 Other chronic pain: Secondary | ICD-10-CM | POA: Diagnosis not present

## 2020-11-24 DIAGNOSIS — M25661 Stiffness of right knee, not elsewhere classified: Secondary | ICD-10-CM | POA: Diagnosis not present

## 2020-11-24 DIAGNOSIS — M25561 Pain in right knee: Secondary | ICD-10-CM | POA: Diagnosis not present

## 2020-11-24 NOTE — Telephone Encounter (Signed)
Requested medication (s) are due for refill today:   Yes  Requested medication (s) are on the active medication list:   Yes  Future visit scheduled:   No   Last ordered: 11/02/2020 #30, 0 refills  Non delegated refill.  Pharmacy requesting a 90 day supply   Requested Prescriptions  Pending Prescriptions Disp Refills   QUEtiapine (SEROQUEL) 25 MG tablet [Pharmacy Med Name: QUETIAPINE FUMARATE 25 MG TAB] 90 tablet 1    Sig: Take 1 tablet (25 mg total) by mouth at bedtime. In place of trazodone for sleep      Not Delegated - Psychiatry:  Antipsychotics - Second Generation (Atypical) - quetiapine Failed - 11/24/2020 12:31 PM      Failed - This refill cannot be delegated      Passed - ALT in normal range and within 180 days    ALT  Date Value Ref Range Status  10/10/2020 24 0 - 44 U/L Final   SGPT (ALT)  Date Value Ref Range Status  04/15/2014 12 (L) U/L Final    Comment:    14-63 NOTE: New Reference Range 03/08/14           Passed - AST in normal range and within 180 days    AST  Date Value Ref Range Status  10/10/2020 35 15 - 41 U/L Final   SGOT(AST)  Date Value Ref Range Status  04/15/2014 24 15 - 37 Unit/L Final          Passed - Completed PHQ-2 or PHQ-9 in the last 360 days      Passed - Last BP in normal range    BP Readings from Last 1 Encounters:  10/29/20 115/74          Passed - Valid encounter within last 6 months    Recent Outpatient Visits           3 weeks ago Insomnia, unspecified type   [facility] Steele Sizer, MD   2 months ago Chest pain, atypical   [facility] Steele Sizer, MD   4 months ago Chronic cough   [facility] Steele Sizer, MD   5 months ago Sarcoidosis   [facility] Steele Sizer, MD   5 months ago Acute rhinitis   [facility] Towanda Malkin, MD       Future Appointments             In 91 months [facility], [facility]

## 2020-11-27 DIAGNOSIS — M25561 Pain in right knee: Secondary | ICD-10-CM | POA: Diagnosis not present

## 2020-11-27 DIAGNOSIS — G8929 Other chronic pain: Secondary | ICD-10-CM | POA: Diagnosis not present

## 2020-11-27 DIAGNOSIS — M25661 Stiffness of right knee, not elsewhere classified: Secondary | ICD-10-CM | POA: Diagnosis not present

## 2020-11-27 DIAGNOSIS — Z96651 Presence of right artificial knee joint: Secondary | ICD-10-CM | POA: Diagnosis not present

## 2020-11-27 DIAGNOSIS — M6281 Muscle weakness (generalized): Secondary | ICD-10-CM | POA: Diagnosis not present

## 2020-11-29 DIAGNOSIS — F411 Generalized anxiety disorder: Secondary | ICD-10-CM | POA: Diagnosis not present

## 2020-11-29 DIAGNOSIS — M25561 Pain in right knee: Secondary | ICD-10-CM | POA: Diagnosis not present

## 2020-11-29 DIAGNOSIS — M25661 Stiffness of right knee, not elsewhere classified: Secondary | ICD-10-CM | POA: Diagnosis not present

## 2020-11-29 DIAGNOSIS — G8929 Other chronic pain: Secondary | ICD-10-CM | POA: Diagnosis not present

## 2020-11-29 DIAGNOSIS — M6281 Muscle weakness (generalized): Secondary | ICD-10-CM | POA: Diagnosis not present

## 2020-11-29 DIAGNOSIS — Z96651 Presence of right artificial knee joint: Secondary | ICD-10-CM | POA: Diagnosis not present

## 2020-11-30 DIAGNOSIS — Z96651 Presence of right artificial knee joint: Secondary | ICD-10-CM | POA: Diagnosis not present

## 2020-12-02 ENCOUNTER — Other Ambulatory Visit: Payer: Self-pay | Admitting: Family Medicine

## 2020-12-02 DIAGNOSIS — J3489 Other specified disorders of nose and nasal sinuses: Secondary | ICD-10-CM

## 2020-12-02 DIAGNOSIS — J302 Other seasonal allergic rhinitis: Secondary | ICD-10-CM

## 2020-12-02 NOTE — Telephone Encounter (Signed)
Requested Prescriptions  Pending Prescriptions Disp Refills  . fluticasone (FLONASE) 50 MCG/ACT nasal spray [Pharmacy Med Name: FLUTICASONE PROP 50 MCG SPRAY] 48 mL 1    Sig: SPRAY 2 SPRAYS INTO EACH NOSTRIL EVERY DAY     Ear, Nose, and Throat: Nasal Preparations - Corticosteroids Passed - 12/02/2020  9:03 AM      Passed - Valid encounter within last 12 months    Recent Outpatient Visits          1 month ago Insomnia, unspecified type   [facility] Steele Sizer, MD   3 months ago Chest pain, atypical   [facility] Steele Sizer, MD   4 months ago Chronic cough   [facility] Steele Sizer, MD   5 months ago Sarcoidosis   [facility] Steele Sizer, MD   6 months ago Acute rhinitis   [facility] Towanda Malkin, MD      Future Appointments            In 10 months [facility], [facility]

## 2020-12-19 ENCOUNTER — Ambulatory Visit: Payer: Medicare Other | Attending: Pulmonary Disease

## 2020-12-19 DIAGNOSIS — F411 Generalized anxiety disorder: Secondary | ICD-10-CM | POA: Diagnosis not present

## 2020-12-25 ENCOUNTER — Other Ambulatory Visit: Payer: Self-pay | Admitting: Family Medicine

## 2020-12-25 DIAGNOSIS — I10 Essential (primary) hypertension: Secondary | ICD-10-CM

## 2020-12-26 NOTE — Telephone Encounter (Signed)
Pt informed that prescription has been sent to pharmacy

## 2020-12-30 DIAGNOSIS — M4685 Other specified inflammatory spondylopathies, thoracolumbar region: Secondary | ICD-10-CM | POA: Diagnosis not present

## 2020-12-30 DIAGNOSIS — M1712 Unilateral primary osteoarthritis, left knee: Secondary | ICD-10-CM | POA: Diagnosis not present

## 2020-12-30 DIAGNOSIS — M1711 Unilateral primary osteoarthritis, right knee: Secondary | ICD-10-CM | POA: Diagnosis not present

## 2021-01-02 DIAGNOSIS — Z6828 Body mass index (BMI) 28.0-28.9, adult: Secondary | ICD-10-CM | POA: Diagnosis not present

## 2021-01-02 DIAGNOSIS — M2550 Pain in unspecified joint: Secondary | ICD-10-CM | POA: Diagnosis not present

## 2021-01-03 DIAGNOSIS — D631 Anemia in chronic kidney disease: Secondary | ICD-10-CM | POA: Diagnosis not present

## 2021-01-03 DIAGNOSIS — N1832 Chronic kidney disease, stage 3b: Secondary | ICD-10-CM | POA: Diagnosis not present

## 2021-01-03 DIAGNOSIS — R319 Hematuria, unspecified: Secondary | ICD-10-CM | POA: Diagnosis not present

## 2021-01-03 DIAGNOSIS — D573 Sickle-cell trait: Secondary | ICD-10-CM | POA: Diagnosis not present

## 2021-01-03 DIAGNOSIS — F411 Generalized anxiety disorder: Secondary | ICD-10-CM | POA: Diagnosis not present

## 2021-01-03 DIAGNOSIS — I129 Hypertensive chronic kidney disease with stage 1 through stage 4 chronic kidney disease, or unspecified chronic kidney disease: Secondary | ICD-10-CM | POA: Diagnosis not present

## 2021-01-10 ENCOUNTER — Encounter: Payer: Self-pay | Admitting: Family Medicine

## 2021-01-10 DIAGNOSIS — S63211A Subluxation of metacarpophalangeal joint of left index finger, initial encounter: Secondary | ICD-10-CM | POA: Diagnosis not present

## 2021-01-10 DIAGNOSIS — M180 Bilateral primary osteoarthritis of first carpometacarpal joints: Secondary | ICD-10-CM | POA: Diagnosis not present

## 2021-01-10 DIAGNOSIS — M89312 Hypertrophy of bone, left shoulder: Secondary | ICD-10-CM | POA: Diagnosis not present

## 2021-01-10 DIAGNOSIS — M1812 Unilateral primary osteoarthritis of first carpometacarpal joint, left hand: Secondary | ICD-10-CM | POA: Diagnosis not present

## 2021-01-10 DIAGNOSIS — S63210A Subluxation of metacarpophalangeal joint of right index finger, initial encounter: Secondary | ICD-10-CM | POA: Diagnosis not present

## 2021-01-10 DIAGNOSIS — M19011 Primary osteoarthritis, right shoulder: Secondary | ICD-10-CM | POA: Diagnosis not present

## 2021-01-10 DIAGNOSIS — M19012 Primary osteoarthritis, left shoulder: Secondary | ICD-10-CM | POA: Diagnosis not present

## 2021-01-10 DIAGNOSIS — M5137 Other intervertebral disc degeneration, lumbosacral region: Secondary | ICD-10-CM | POA: Diagnosis not present

## 2021-01-10 DIAGNOSIS — M5136 Other intervertebral disc degeneration, lumbar region: Secondary | ICD-10-CM | POA: Diagnosis not present

## 2021-01-10 DIAGNOSIS — M89311 Hypertrophy of bone, right shoulder: Secondary | ICD-10-CM | POA: Diagnosis not present

## 2021-01-10 DIAGNOSIS — M1811 Unilateral primary osteoarthritis of first carpometacarpal joint, right hand: Secondary | ICD-10-CM | POA: Diagnosis not present

## 2021-01-17 DIAGNOSIS — F411 Generalized anxiety disorder: Secondary | ICD-10-CM | POA: Diagnosis not present

## 2021-01-31 DIAGNOSIS — F411 Generalized anxiety disorder: Secondary | ICD-10-CM | POA: Diagnosis not present

## 2021-02-13 ENCOUNTER — Other Ambulatory Visit: Payer: Self-pay | Admitting: Family Medicine

## 2021-02-13 ENCOUNTER — Encounter: Payer: Self-pay | Admitting: Family Medicine

## 2021-02-13 DIAGNOSIS — Z87898 Personal history of other specified conditions: Secondary | ICD-10-CM

## 2021-02-13 MED ORDER — SCOPOLAMINE 1 MG/3DAYS TD PT72: 1.0000 | MEDICATED_PATCH | TRANSDERMAL | 12 refills | Status: DC

## 2021-02-14 DIAGNOSIS — F411 Generalized anxiety disorder: Secondary | ICD-10-CM | POA: Diagnosis not present

## 2021-02-25 ENCOUNTER — Other Ambulatory Visit: Payer: Self-pay | Admitting: Family Medicine

## 2021-02-25 DIAGNOSIS — I10 Essential (primary) hypertension: Secondary | ICD-10-CM

## 2021-02-25 NOTE — Telephone Encounter (Signed)
last RF 12/25/20 #90

## 2021-03-01 DIAGNOSIS — Z20822 Contact with and (suspected) exposure to covid-19: Secondary | ICD-10-CM | POA: Diagnosis not present

## 2021-03-14 DIAGNOSIS — F411 Generalized anxiety disorder: Secondary | ICD-10-CM | POA: Diagnosis not present

## 2021-04-10 ENCOUNTER — Ambulatory Visit (INDEPENDENT_AMBULATORY_CARE_PROVIDER_SITE_OTHER): Payer: Medicare Other | Admitting: Family Medicine

## 2021-04-10 ENCOUNTER — Other Ambulatory Visit: Payer: Self-pay

## 2021-04-10 ENCOUNTER — Encounter: Payer: Self-pay | Admitting: Family Medicine

## 2021-04-10 VITALS — BP 132/84 | HR 102 | Temp 97.9°F | Resp 16 | Ht 71.0 in | Wt 204.0 lb

## 2021-04-10 DIAGNOSIS — Z9989 Dependence on other enabling machines and devices: Secondary | ICD-10-CM

## 2021-04-10 DIAGNOSIS — J449 Chronic obstructive pulmonary disease, unspecified: Secondary | ICD-10-CM

## 2021-04-10 DIAGNOSIS — D573 Sickle-cell trait: Secondary | ICD-10-CM

## 2021-04-10 DIAGNOSIS — I10 Essential (primary) hypertension: Secondary | ICD-10-CM

## 2021-04-10 DIAGNOSIS — N1832 Chronic kidney disease, stage 3b: Secondary | ICD-10-CM | POA: Diagnosis not present

## 2021-04-10 DIAGNOSIS — M62838 Other muscle spasm: Secondary | ICD-10-CM | POA: Diagnosis not present

## 2021-04-10 DIAGNOSIS — F325 Major depressive disorder, single episode, in full remission: Secondary | ICD-10-CM | POA: Diagnosis not present

## 2021-04-10 DIAGNOSIS — I700 Atherosclerosis of aorta: Secondary | ICD-10-CM | POA: Diagnosis not present

## 2021-04-10 DIAGNOSIS — J8489 Other specified interstitial pulmonary diseases: Secondary | ICD-10-CM

## 2021-04-10 DIAGNOSIS — D71 Functional disorders of polymorphonuclear neutrophils: Secondary | ICD-10-CM

## 2021-04-10 DIAGNOSIS — G4733 Obstructive sleep apnea (adult) (pediatric): Secondary | ICD-10-CM

## 2021-04-10 DIAGNOSIS — N2581 Secondary hyperparathyroidism of renal origin: Secondary | ICD-10-CM | POA: Diagnosis not present

## 2021-04-10 MED ORDER — BACLOFEN 10 MG PO TABS: 10.0000 mg | ORAL_TABLET | Freq: Three times a day (TID) | ORAL | 0 refills | Status: DC | PRN

## 2021-04-10 NOTE — Progress Notes (Signed)
Name: Melissa [name]   MRN: [id number]    DOB: August 23, 1950   Date:04/10/2021       Progress Note  Subjective  Chief Complaint  Stiff Neck  HPI  Atherosclerosis aorta and CAD/Celiac artery aneurysm : found on CT chest done 2021. She has been on statin therapy and aspirin, sees cardiologist , Dr. Rockey Situ yearly. Last CT renal stone was 06/22 and size of aneurysm 15 mm and stable, monitor every 2 -3 years.   OSA : she sees pulmonologist, wears CPAP every night.   Sarcoidosis/Asthma and chronic bronchitis: under the care of Dr. Regan Lemming at [facility], having yearly CT scans, not on any medications at this time. Had multiple labs earlier this year seen by Rheumatologist since she also had joint aches and was given reassurance and advised to take Tylenol prn   CKI stage III: history of secondary hyperparathyroidism, under the care of nephrologist, last urine micro normal GFR 39. No itching, normal urine output. No anemia  Sickle cell trait: asymptomatic.   Depression Major Depression:she took medications for a while, it was present for a few years, carrying for her mother, also stress before retiring, but doing well, off medications, in remission  Cervical radiculitis: she used to get injections by Dr. Phyllis Ginger, symptoms used to be on the right side. She states this episode is on the left side, started mid day, it is described as spasms when she moves her neck, otherwise a dull aching and constant pain. It does not radiate, she can only take tylenol but not controlling symptoms at this time. She does not have muscle relaxer at home.   Arthralgias: she has aches and pains all over her body, lower back, outer hips, left knee pain, she would like to go back to Dr. Phyllis Ginger and advised her to discuss pain management    Patient Active Problem List   Diagnosis Date Noted  . Hematuria 01/03/2021  . Total knee replacement status 10/18/2020  . Primary osteoarthritis of right hip 08/06/2020  . Sarcoidosis  05/29/2020  . Aneurysm of celiac artery ([place]) 02/11/2020  . COVID-19 virus infection 09/06/2019  . Localized swelling of right lower leg 09/06/2019  . OSA on CPAP 09/06/2019  . Secondary hyperparathyroidism of renal origin ([place]) 07/21/2019  . Anemia in chronic kidney disease 06/14/2019  . Benign hypertensive kidney disease with chronic kidney disease 06/14/2019  . Sickle cell trait ([place]) 06/14/2019  . Chronic kidney disease, stage 3b ([place]) 06/14/2019  . Chronic bronchitis ([place]) 05/19/2019  . Chronic venous insufficiency 04/15/2019  . Lymphedema 04/15/2019  . Swelling of limb 03/30/2019  . Family history of colon cancer   . Trigeminal neuralgia 09/11/2018  . Cardiac LV ejection fraction of 40-49% 05/07/2017  . Low back pain without sciatica 02/21/2017  . Renal cyst 10/18/2016  . Atherosclerosis of aorta ([place]) 10/16/2016  . History of colonic polyps 09/25/2016  . Dyslipidemia 12/08/2015  . Gastroesophageal reflux disease without esophagitis 08/11/2015  . Primary osteoarthritis of knee 06/13/2015  . Hip bursitis 06/13/2015  . RLS (restless legs syndrome) 06/13/2015  . Depression, major, in remission ([place]) 06/13/2015  . B12 deficiency 06/13/2015  . History of Roux-en-Y gastric bypass 06/13/2015  . History of iron deficiency anemia 06/13/2015  . Vitamin D deficiency 06/13/2015  . Hypertension, benign 06/13/2015  . Perennial allergic rhinitis with seasonal variation 06/13/2015  . Obesity (BMI 30.0-34.9) 06/13/2015  . Primary osteoarthritis of both knees 06/13/2015  . Cervical radiculitis 01/04/2014  . Cervical spinal stenosis 01/04/2014  .  Cervical osteoarthritis 01/04/2014    Past Surgical History:  Procedure Laterality Date  . BARIATRIC SURGERY    . CESAREAN SECTION     3 or more  . COLONOSCOPY WITH PROPOFOL N/A 03/15/2019   Procedure: COLONOSCOPY WITH PROPOFOL;  Surgeon: [name], MD;  Location: [facility] ENDOSCOPY;  Service: Gastroenterology;  Laterality: N/A;  .  GASTRIC BYPASS    . KNEE ARTHROPLASTY Right 10/18/2020   Procedure: COMPUTER ASSISTED TOTAL KNEE ARTHROPLASTY;  Surgeon: Dereck Leep, MD;  Location: [facility] ORS;  Service: Orthopedics;  Laterality: Right;  . SHOULDER ARTHROSCOPY Right   . TUBAL LIGATION      Family History  Problem Relation Age of Onset  . Hypercholesterolemia Mother   . Heart disease Mother   . Hypertension Mother   . Alcohol abuse Father   . Lung cancer Brother   . Alcohol abuse Brother   . Diabetes Mellitus II Sister   . Hypertension Maternal Grandmother   . Colon cancer Sister   . Breast cancer Neg Hx   . Prostate cancer Neg Hx   . Bladder Cancer Neg Hx   . Kidney cancer Neg Hx     Social History   Tobacco Use  . Smoking status: Never  . Smokeless tobacco: Never  . Tobacco comments:    Second hand smoke exposure  Substance Use Topics  . Alcohol use: Not Currently    Alcohol/week: 0.0 standard drinks     Current Outpatient Medications:  .  acetaminophen (TYLENOL) 650 MG CR tablet, Take 1,300 mg by mouth every 8 (eight) hours as needed for pain., Disp: , Rfl:  .  albuterol (VENTOLIN HFA) 108 (90 Base) MCG/ACT inhaler, Inhale 2 puffs into the lungs every 6 (six) hours as needed for wheezing or shortness of breath., Disp: 18 g, Rfl: 0 .  Ascorbic Acid (VITAMIN C) 1000 MG tablet, Take 1,000 mg by mouth daily., Disp: , Rfl:  .  atorvastatin (LIPITOR) 40 MG tablet, TAKE 1 TABLET BY MOUTH EVERY DAY, Disp: 90 tablet, Rfl: 1 .  azelastine (ASTELIN) 0.1 % nasal spray, 2 SPRAYS IN EACH NOSTRIL DAILY, Disp: 90 mL, Rfl: 0 .  baclofen (LIORESAL) 10 MG tablet, Take 1-2 tablets (10-20 mg total) by mouth 3 (three) times daily as needed for muscle spasms., Disp: 30 each, Rfl: 0 .  budesonide (PULMICORT) 0.5 MG/2ML nebulizer solution, Take 2 mLs (0.5 mg total) by nebulization in the morning and at bedtime., Disp: 120 mL, Rfl: 1 .  calcium carbonate (TUMS - DOSED IN MG ELEMENTAL CALCIUM) 500 MG chewable tablet, Chew  500-1,000 mg by mouth daily as needed for indigestion or heartburn., Disp: , Rfl:  .  carbamazepine (TEGRETOL) 200 MG tablet, Take 200 mg by mouth 3 (three) times daily as needed (trigeminal neuralgia)., Disp: , Rfl:  .  ferrous sulfate 325 (65 FE) MG tablet, Take 325 mg by mouth daily with breakfast., Disp: , Rfl:  .  fluticasone (FLONASE) 50 MCG/ACT nasal spray, SPRAY 2 SPRAYS INTO EACH NOSTRIL EVERY DAY, Disp: 48 mL, Rfl: 1 .  gabapentin (NEURONTIN) 100 MG capsule, Take 100 mg by mouth at bedtime as needed (pain)., Disp: , Rfl:  .  ipratropium-albuterol (DUONEB) 0.5-2.5 (3) MG/3ML SOLN, Take 3 mLs by nebulization every 6 (six) hours as needed., Disp: 360 mL, Rfl: 0 .  loratadine (CLARITIN) 10 MG tablet, Take 1 tablet (10 mg total) by mouth daily., Disp: 90 tablet, Rfl: 0 .  losartan (COZAAR) 25 MG tablet, Take 1 tablet (  25 mg total) by mouth daily., Disp: 90 tablet, Rfl: 0 .  magnesium oxide (MAG-OX) 400 MG tablet, Take 400 mg by mouth daily., Disp: , Rfl:  .  Multiple Vitamins-Minerals (CENTRUM SILVER) tablet, Take 1 tablet by mouth daily., Disp: 30 tablet, Rfl: 0 .  oxyCODONE (OXY IR/ROXICODONE) 5 MG immediate release tablet, Take 1 tablet (5 mg total) by mouth every 4 (four) hours as needed for moderate pain (pain score 4-6)., Disp: 30 tablet, Rfl: 0 .  zinc gluconate 50 MG tablet, Take 50 mg by mouth daily., Disp: , Rfl:   Allergies  Allergen Reactions  . Contrast Media [Iodinated Diagnostic Agents]     Unknown reaction  . Shellfish Allergy     Edema    I personally reviewed active problem list, medication list, allergies, family history, social history, health maintenance with the patient/caregiver today.   ROS  Constitutional: Negative for fever or weight change.  Respiratory: Negative for cough and shortness of breath.   Cardiovascular: Negative for chest pain or palpitations.  Gastrointestinal: Negative for abdominal pain, no bowel changes.  Musculoskeletal: positive for gait  problem, intermittent knee  joint swelling.  Skin: Negative for rash.  Neurological: Negative for dizziness or headache.  No other specific complaints in a complete review of systems (except as listed in HPI above).   Objective  Vitals:   04/10/21 1006  BP: 132/84  Pulse: (!) 102  Resp: 16  Temp: 97.9 F (36.6 C)  SpO2: 98%  Weight: 204 lb (92.5 kg)  Height: '5\' 11"'$  (1.803 m)    Body mass index is 28.45 kg/m.  Physical Exam  Constitutional: Patient appears well-developed and well-nourished. Obese  No distress.  HEENT: head atraumatic, normocephalic, pupils equal and reactive to light Cardiovascular: Normal rate, regular rhythm and normal heart sounds.  No murmur heard. No BLE edema. Pulmonary/Chest: Effort normal and breath sounds normal. No respiratory distress. Abdominal: Soft.  There is no tenderness. Muscular Skeletal: mild effusion left knee, decrease rom of neck, tense left sternocleidomastoid muscle  Psychiatric: Patient has a normal mood and affect. behavior is normal. Judgment and thought content normal.    PHQ2/9: Depression screen [facility] 2/9 04/10/2021 11/01/2020 10/12/2020 08/31/2020 07/18/2020  Decreased Interest 0 0 0 0 0  Down, Depressed, Hopeless 0 0 0 0 0  PHQ - 2 Score 0 0 0 0 0  Altered sleeping - - - 1 -  Tired, decreased energy - - - 0 -  Change in appetite - - - 0 -  Feeling bad or failure about yourself  - - - 0 -  Trouble concentrating - - - 0 -  Moving slowly or fidgety/restless - - - 0 -  Suicidal thoughts - - - 0 -  PHQ-9 Score - - - 1 -  Difficult doing work/chores - - - Not difficult at all -  Some recent data might be hidden    phq 9 is negative   Fall Risk: Fall Risk  04/10/2021 11/01/2020 10/12/2020 08/31/2020 07/18/2020  Falls in the past year? 0 1 1 0 1  Comment - - - - -  Number falls in past yr: 0 0 0 0 0  Injury with Fall? 0 0 0 0 0  Comment - - - - -  Risk for fall due to : No Fall Risks History of fall(s) No Fall Risks - -  Follow  up Falls prevention discussed Falls prevention discussed Falls prevention discussed - -      Functional  Status Survey: Is the patient deaf or have difficulty hearing?: No Does the patient have difficulty seeing, even when wearing glasses/contacts?: No Does the patient have difficulty concentrating, remembering, or making decisions?: No Does the patient have difficulty walking or climbing stairs?: No Does the patient have difficulty dressing or bathing?: No Does the patient have difficulty doing errands alone such as visiting a doctor's office or shopping?: No    Assessment & Plan  1. Interstitial lung disease due to granulomatous disease ([place])  Keep follow up with lung doctor   2. OSA on CPAP   3. Hypertension, benign  At goal   4. Depression, major, in remission ([place])  Doing well   5. Atherosclerosis of aorta ([facility])  - Lipid panel - COMPLETE METABOLIC PANEL WITH GFR  6. Chronic asthmatic bronchitis ([place])   7. Secondary hyperparathyroidism of renal origin ([place])   8. Chronic kidney disease, stage 3b ([place])  Keep follow up with nephrologist   9. Sickle cell trait ([place])   10. Neck muscle spasm  - baclofen (LIORESAL) 10 MG tablet; Take 1-2 tablets (10-20 mg total) by mouth 3 (three) times daily as needed for muscle spasms.  Dispense: 30 each; Refill: 0

## 2021-04-11 ENCOUNTER — Other Ambulatory Visit: Payer: Self-pay | Admitting: Family Medicine

## 2021-04-11 DIAGNOSIS — F411 Generalized anxiety disorder: Secondary | ICD-10-CM | POA: Diagnosis not present

## 2021-04-11 DIAGNOSIS — N1832 Chronic kidney disease, stage 3b: Secondary | ICD-10-CM

## 2021-04-11 LAB — COMPLETE METABOLIC PANEL WITH GFR
AG Ratio: 1.2 (calc) (ref 1.0–2.5)
ALT: 20 U/L (ref 6–29)
AST: 29 U/L (ref 10–35)
Albumin: 4.1 g/dL (ref 3.6–5.1)
Alkaline phosphatase (APISO): 76 U/L (ref 37–153)
BUN/Creatinine Ratio: 19 (calc) (ref 6–22)
BUN: 31 mg/dL — ABNORMAL HIGH (ref 7–25)
CO2: 24 mmol/L (ref 20–32)
Calcium: 9.7 mg/dL (ref 8.6–10.4)
Chloride: 104 mmol/L (ref 98–110)
Creat: 1.64 mg/dL — ABNORMAL HIGH (ref 0.50–1.05)
Globulin: 3.3 g/dL (calc) (ref 1.9–3.7)
Glucose, Bld: 86 mg/dL (ref 65–99)
Potassium: 4.5 mmol/L (ref 3.5–5.3)
Sodium: 139 mmol/L (ref 135–146)
Total Bilirubin: 0.4 mg/dL (ref 0.2–1.2)
Total Protein: 7.4 g/dL (ref 6.1–8.1)
eGFR: 34 mL/min/{1.73_m2} — ABNORMAL LOW (ref >=60)

## 2021-04-11 LAB — LIPID PANEL
Cholesterol: 202 mg/dL — ABNORMAL HIGH (ref <200)
HDL: 92 mg/dL (ref >=50)
LDL Cholesterol (Calc): 94 mg/dL (calc)
Non-HDL Cholesterol (Calc): 110 mg/dL (calc) (ref <130)
Total CHOL/HDL Ratio: 2.2 (calc) (ref <5.0)
Triglycerides: 72 mg/dL (ref <150)

## 2021-04-14 ENCOUNTER — Encounter: Payer: Self-pay | Admitting: Family Medicine

## 2021-04-16 ENCOUNTER — Encounter: Payer: Self-pay | Admitting: Family Medicine

## 2021-04-16 ENCOUNTER — Telehealth: Payer: Self-pay

## 2021-04-16 NOTE — Telephone Encounter (Signed)
Spoke with patient and gave diet recommendations for helping to reduce bad cholesterol levels. Advised to avoid/limit fatty fried foods, whole dairy products, add fiber and Omega-3 fish oil. Patients stated she has an appointment in one month for follow up blood work and will begin working diligently to improve her cholesterol.

## 2021-04-17 ENCOUNTER — Other Ambulatory Visit: Payer: Self-pay | Admitting: Family Medicine

## 2021-04-17 DIAGNOSIS — M62838 Other muscle spasm: Secondary | ICD-10-CM

## 2021-04-17 MED ORDER — METAXALONE 800 MG PO TABS: 800.0000 mg | ORAL_TABLET | Freq: Three times a day (TID) | ORAL | 0 refills | Status: DC | PRN

## 2021-04-18 DIAGNOSIS — M7061 Trochanteric bursitis, right hip: Secondary | ICD-10-CM | POA: Diagnosis not present

## 2021-04-18 DIAGNOSIS — M1611 Unilateral primary osteoarthritis, right hip: Secondary | ICD-10-CM | POA: Diagnosis not present

## 2021-04-18 DIAGNOSIS — M1612 Unilateral primary osteoarthritis, left hip: Secondary | ICD-10-CM | POA: Diagnosis not present

## 2021-04-23 DIAGNOSIS — Z20822 Contact with and (suspected) exposure to covid-19: Secondary | ICD-10-CM | POA: Diagnosis not present

## 2021-04-25 DIAGNOSIS — F411 Generalized anxiety disorder: Secondary | ICD-10-CM | POA: Diagnosis not present

## 2021-05-09 DIAGNOSIS — F411 Generalized anxiety disorder: Secondary | ICD-10-CM | POA: Diagnosis not present

## 2021-05-11 IMAGING — MG DIGITAL SCREENING BILAT W/ TOMO W/ CAD
10 of 15 series · 10 of 35 positions shown · non-contrast
Comparison: Previous exam(s).

CLINICAL DATA: Screening.

EXAM:
DIGITAL SCREENING BILATERAL MAMMOGRAM WITH TOMO AND CAD

[R CC synth-2D (1 of 2)]
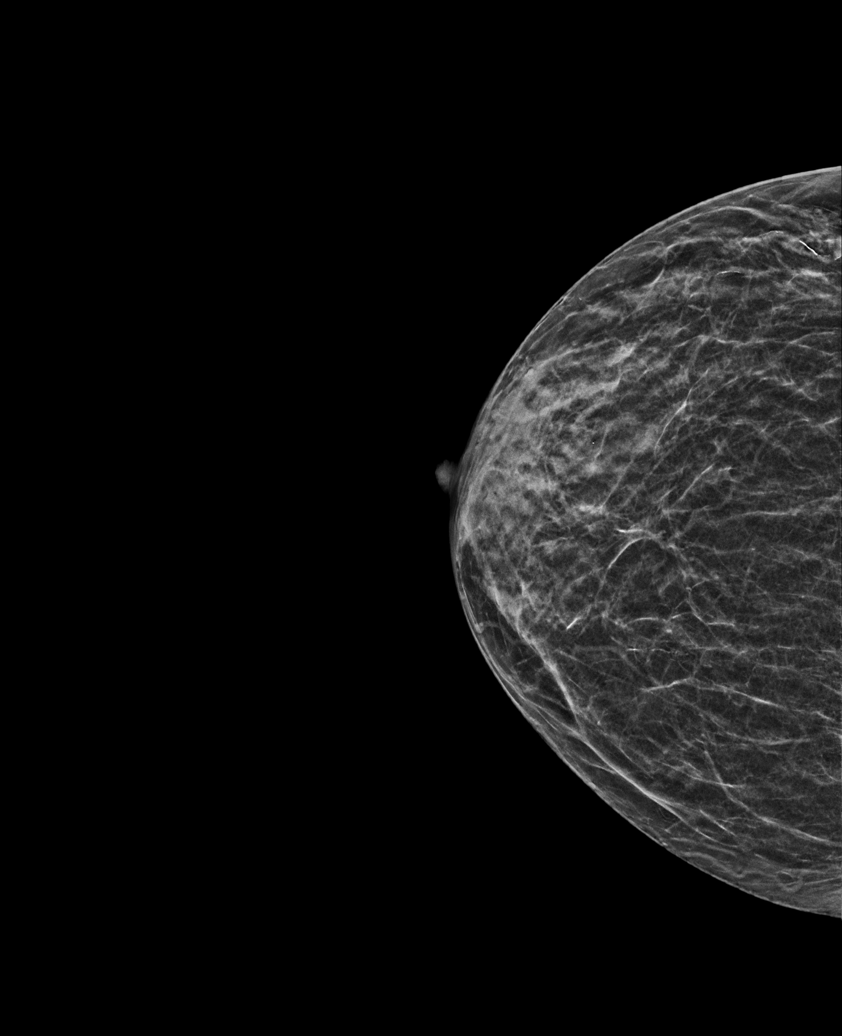

[L MLO synth-2D]
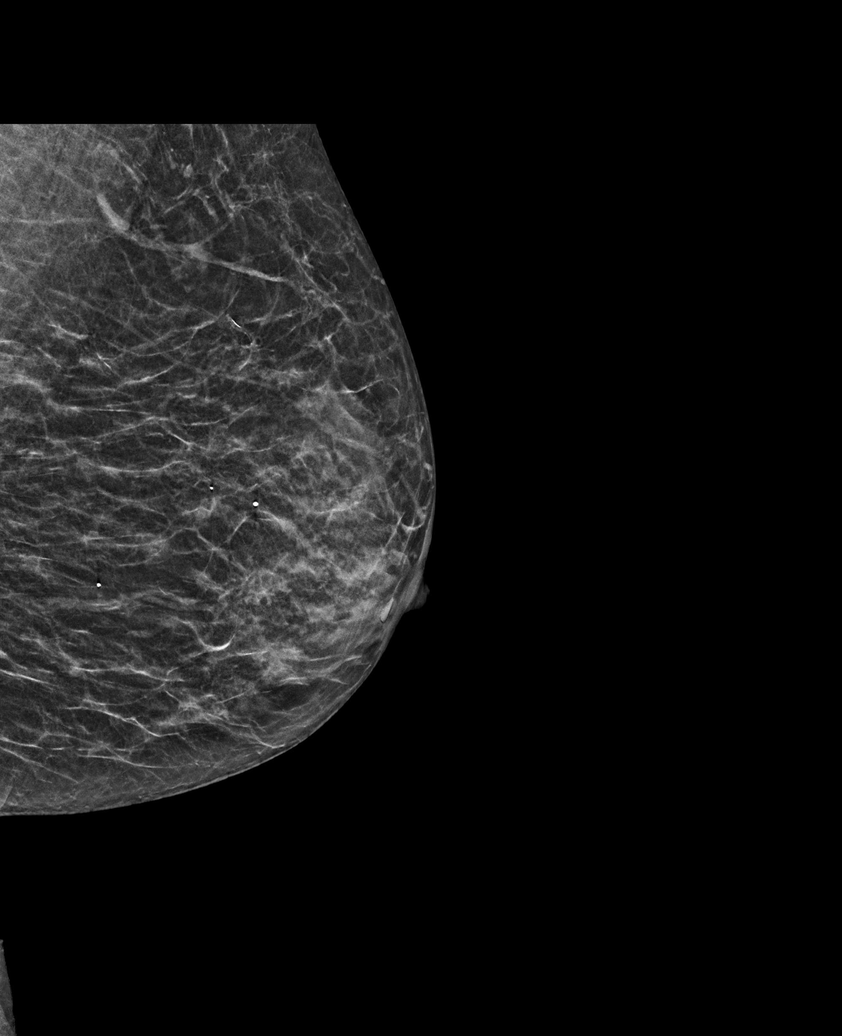

[R CC synth-2D (2 of 2)]
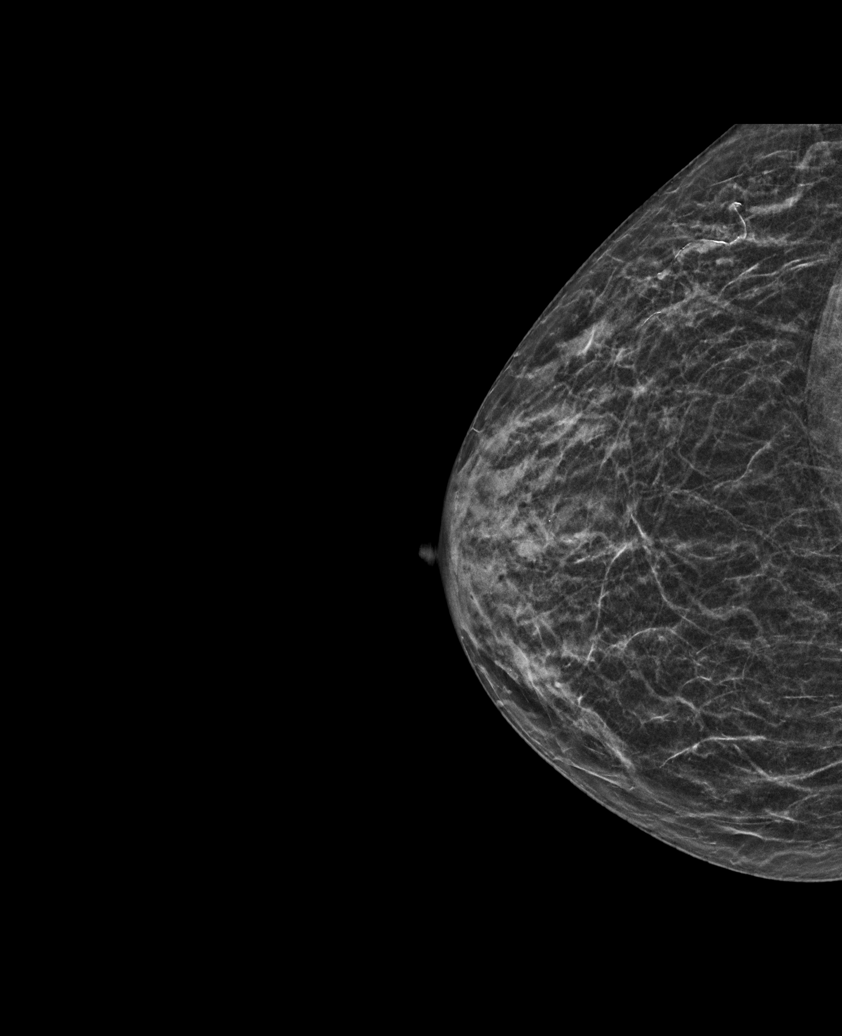

[L CC synth-2D]
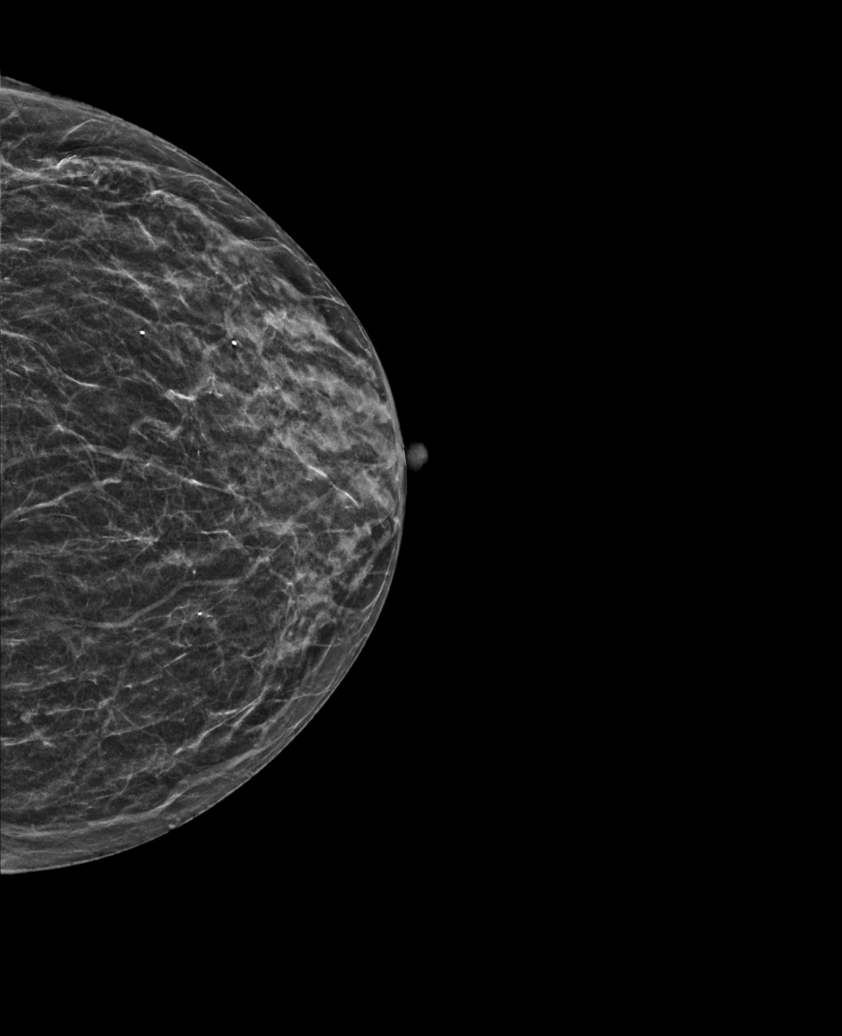

[R MLO synth-2D]
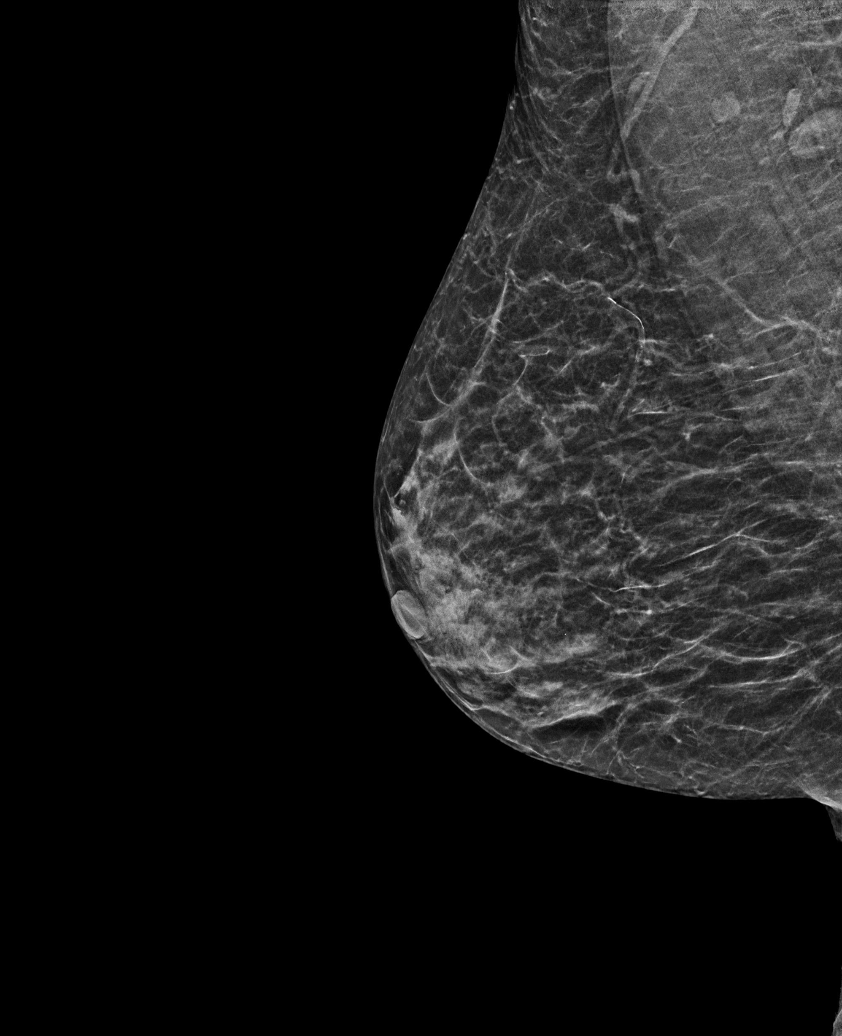

[R CC tomo (1 of 2)]
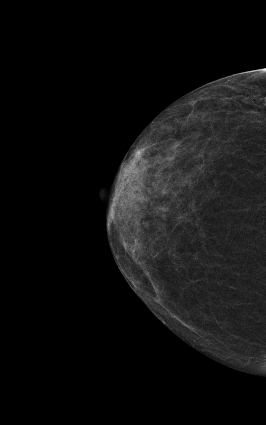

[R CC tomo (2 of 2)]
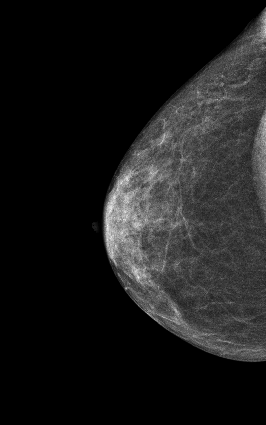

[L CC tomo]
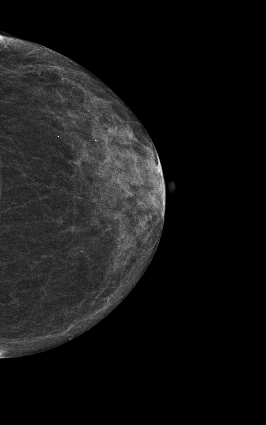

[R MLO tomo]
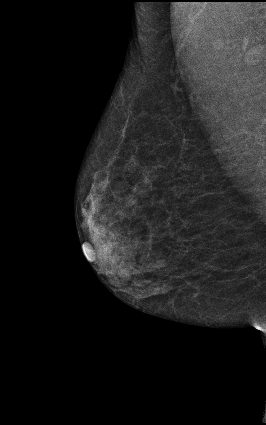

[L MLO tomo]
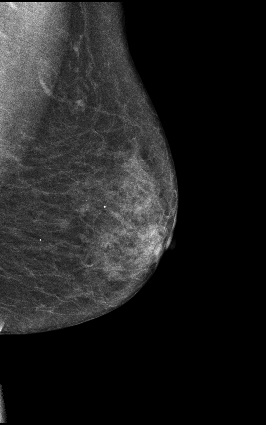

[10 of 35 positions shown; findings below may reference images not displayed]

ACR Breast Density Category c: The breast tissue is heterogeneously
dense, which may obscure small masses.
FINDINGS: There are no findings suspicious for malignancy. Images were
processed with CAD.
IMPRESSION: No mammographic evidence of malignancy. A result letter of this
screening mammogram will be mailed directly to the patient.

RECOMMENDATION:
Screening mammogram in one year. (Code:[id number])

BI-RADS CATEGORY  1: Negative.

## 2021-05-23 ENCOUNTER — Other Ambulatory Visit: Payer: Self-pay | Admitting: Family Medicine

## 2021-05-23 DIAGNOSIS — I700 Atherosclerosis of aorta: Secondary | ICD-10-CM

## 2021-05-28 DIAGNOSIS — M7061 Trochanteric bursitis, right hip: Secondary | ICD-10-CM | POA: Diagnosis not present

## 2021-05-28 DIAGNOSIS — M2560 Stiffness of unspecified joint, not elsewhere classified: Secondary | ICD-10-CM | POA: Diagnosis not present

## 2021-05-28 DIAGNOSIS — M25552 Pain in left hip: Secondary | ICD-10-CM | POA: Diagnosis not present

## 2021-05-28 DIAGNOSIS — M6281 Muscle weakness (generalized): Secondary | ICD-10-CM | POA: Diagnosis not present

## 2021-05-28 DIAGNOSIS — M25551 Pain in right hip: Secondary | ICD-10-CM | POA: Diagnosis not present

## 2021-05-30 DIAGNOSIS — M7061 Trochanteric bursitis, right hip: Secondary | ICD-10-CM | POA: Diagnosis not present

## 2021-06-04 DIAGNOSIS — M7061 Trochanteric bursitis, right hip: Secondary | ICD-10-CM | POA: Diagnosis not present

## 2021-06-06 DIAGNOSIS — F411 Generalized anxiety disorder: Secondary | ICD-10-CM | POA: Diagnosis not present

## 2021-06-06 DIAGNOSIS — M7061 Trochanteric bursitis, right hip: Secondary | ICD-10-CM | POA: Diagnosis not present

## 2021-06-11 ENCOUNTER — Other Ambulatory Visit: Payer: Self-pay

## 2021-06-11 ENCOUNTER — Ambulatory Visit (INDEPENDENT_AMBULATORY_CARE_PROVIDER_SITE_OTHER): Payer: Medicare Other

## 2021-06-11 DIAGNOSIS — N1832 Chronic kidney disease, stage 3b: Secondary | ICD-10-CM | POA: Diagnosis not present

## 2021-06-11 DIAGNOSIS — Z23 Encounter for immunization: Secondary | ICD-10-CM | POA: Diagnosis not present

## 2021-06-11 DIAGNOSIS — M7061 Trochanteric bursitis, right hip: Secondary | ICD-10-CM | POA: Diagnosis not present

## 2021-06-11 NOTE — Progress Notes (Signed)
Patient tolerated well. No adverse reaction immediately noted, Patient agreed to contact us with any questions or concerns.

## 2021-06-12 LAB — RENAL FUNCTION PANEL
Albumin: 4 g/dL (ref 3.6–5.1)
BUN/Creatinine Ratio: 13 (calc) (ref 6–22)
BUN: 20 mg/dL (ref 7–25)
CO2: 24 mmol/L (ref 20–32)
Calcium: 9.6 mg/dL (ref 8.6–10.4)
Chloride: 107 mmol/L (ref 98–110)
Creat: 1.6 mg/dL — ABNORMAL HIGH (ref 0.50–1.05)
Glucose, Bld: 82 mg/dL (ref 65–99)
Phosphorus: 3.9 mg/dL (ref 2.1–4.3)
Potassium: 4.8 mmol/L (ref 3.5–5.3)
Sodium: 141 mmol/L (ref 135–146)

## 2021-06-13 DIAGNOSIS — M7061 Trochanteric bursitis, right hip: Secondary | ICD-10-CM | POA: Diagnosis not present

## 2021-06-18 DIAGNOSIS — M7061 Trochanteric bursitis, right hip: Secondary | ICD-10-CM | POA: Diagnosis not present

## 2021-06-20 DIAGNOSIS — M7061 Trochanteric bursitis, right hip: Secondary | ICD-10-CM | POA: Diagnosis not present

## 2021-06-20 DIAGNOSIS — F411 Generalized anxiety disorder: Secondary | ICD-10-CM | POA: Diagnosis not present

## 2021-06-21 ENCOUNTER — Other Ambulatory Visit: Payer: Self-pay | Admitting: Family Medicine

## 2021-06-21 DIAGNOSIS — I10 Essential (primary) hypertension: Secondary | ICD-10-CM

## 2021-06-25 DIAGNOSIS — M7061 Trochanteric bursitis, right hip: Secondary | ICD-10-CM | POA: Diagnosis not present

## 2021-06-27 DIAGNOSIS — M7061 Trochanteric bursitis, right hip: Secondary | ICD-10-CM | POA: Diagnosis not present

## 2021-07-04 DIAGNOSIS — F411 Generalized anxiety disorder: Secondary | ICD-10-CM | POA: Diagnosis not present

## 2021-07-18 DIAGNOSIS — D869 Sarcoidosis, unspecified: Secondary | ICD-10-CM | POA: Diagnosis not present

## 2021-07-18 DIAGNOSIS — Z6828 Body mass index (BMI) 28.0-28.9, adult: Secondary | ICD-10-CM | POA: Diagnosis not present

## 2021-07-18 DIAGNOSIS — M159 Polyosteoarthritis, unspecified: Secondary | ICD-10-CM | POA: Diagnosis not present

## 2021-07-23 ENCOUNTER — Encounter: Payer: Self-pay | Admitting: Family Medicine

## 2021-07-24 NOTE — Progress Notes (Signed)
Name: Melissa [name]   MRN: [id number]    DOB: 25-Feb-1951   Date:07/25/2021       Progress Note  Subjective  Chief Complaint  Cough/Shoulder Pain  HPI  Interstitial lung disease:  diagnosed many years ago with sarcoidosis, but after COVID-19 04/2019 and had a chronic cough afterwards, was seen by local pulmonologist and CT chest showed signs of sarcoidosis an referred to [facility] and is now seeing Rheumatologist and was cleared. She was asymptomatic for months, however over the past few weeks she has noticed a cough not productive that is triggered by strong scents, deep inspiration , no wheezing or SOB   She has post-nasal drainage, but is intermittent and chronic , she has been using her nasal spray and otc allergy medication.   Denies fever, lack of appetite or chills.   Asthma Mild intermittent: since childhood but symptoms free for many years, but problems since COVID-19, this time symptoms started a few weeks ago   Patient Active Problem List   Diagnosis Date Noted   Hematuria 01/03/2021   Total knee replacement status 10/18/2020   Primary osteoarthritis of right hip 08/06/2020   Sarcoidosis 05/29/2020   Aneurysm of celiac artery ([place]) 02/11/2020   COVID-19 virus infection 09/06/2019   Localized swelling of right lower leg 09/06/2019   OSA on CPAP 09/06/2019   Secondary hyperparathyroidism of renal origin ([place]) 07/21/2019   Anemia in chronic kidney disease 06/14/2019   Benign hypertensive kidney disease with chronic kidney disease 06/14/2019   Sickle cell trait ([place]) 06/14/2019   Chronic kidney disease, stage 3b ([place]) 06/14/2019   Chronic bronchitis ([place]) 05/19/2019   Chronic venous insufficiency 04/15/2019   Lymphedema 04/15/2019   Swelling of limb 03/30/2019   Family history of colon cancer    Trigeminal neuralgia 09/11/2018   Cardiac LV ejection fraction of 40-49% 05/07/2017   Low back pain without sciatica 02/21/2017   Renal cyst 10/18/2016    Atherosclerosis of aorta ([place]) 10/16/2016   History of colonic polyps 09/25/2016   Dyslipidemia 12/08/2015   Gastroesophageal reflux disease without esophagitis 08/11/2015   Primary osteoarthritis of knee 06/13/2015   Hip bursitis 06/13/2015   RLS (restless legs syndrome) 06/13/2015   Depression, major, in remission ([place]) 06/13/2015   B12 deficiency 06/13/2015   History of Roux-en-Y gastric bypass 06/13/2015   History of iron deficiency anemia 06/13/2015   Vitamin D deficiency 06/13/2015   Hypertension, benign 06/13/2015   Perennial allergic rhinitis with seasonal variation 06/13/2015   Obesity (BMI 30.0-34.9) 06/13/2015   Primary osteoarthritis of both knees 06/13/2015   Cervical radiculitis 01/04/2014   Cervical spinal stenosis 01/04/2014   Cervical osteoarthritis 01/04/2014    Past Surgical History:  Procedure Laterality Date   BARIATRIC SURGERY     CESAREAN SECTION     3 or more   COLONOSCOPY WITH PROPOFOL N/A 03/15/2019   Procedure: COLONOSCOPY WITH PROPOFOL;  Surgeon: [name], MD;  Location: [facility] ENDOSCOPY;  Service: Gastroenterology;  Laterality: N/A;   GASTRIC BYPASS     KNEE ARTHROPLASTY Right 10/18/2020   Procedure: COMPUTER ASSISTED TOTAL KNEE ARTHROPLASTY;  Surgeon: Dereck Leep, MD;  Location: [facility] ORS;  Service: Orthopedics;  Laterality: Right;   SHOULDER ARTHROSCOPY Right    TUBAL LIGATION      Family History  Problem Relation Age of Onset   Hypercholesterolemia Mother    Heart disease Mother    Hypertension Mother    Alcohol abuse Father    Lung cancer Brother  Alcohol abuse Brother    Diabetes Mellitus II Sister    Hypertension Maternal Grandmother    Colon cancer Sister    Breast cancer Neg Hx    Prostate cancer Neg Hx    Bladder Cancer Neg Hx    Kidney cancer Neg Hx     Social History   Tobacco Use   Smoking status: Never   Smokeless tobacco: Never   Tobacco comments:    Second hand smoke exposure  Substance Use Topics    Alcohol use: Not Currently    Alcohol/week: 0.0 standard drinks     Current Outpatient Medications:    acetaminophen (TYLENOL) 650 MG CR tablet, Take 1,300 mg by mouth every 8 (eight) hours as needed for pain., Disp: , Rfl:    albuterol (VENTOLIN HFA) 108 (90 Base) MCG/ACT inhaler, Inhale 2 puffs into the lungs every 6 (six) hours as needed for wheezing or shortness of breath., Disp: 18 g, Rfl: 0   Ascorbic Acid (VITAMIN C) 1000 MG tablet, Take 1,000 mg by mouth daily., Disp: , Rfl:    atorvastatin (LIPITOR) 40 MG tablet, TAKE 1 TABLET BY MOUTH EVERY DAY, Disp: 90 tablet, Rfl: 1   azelastine (ASTELIN) 0.1 % nasal spray, 2 SPRAYS IN EACH NOSTRIL DAILY, Disp: 90 mL, Rfl: 0   budesonide (PULMICORT) 0.5 MG/2ML nebulizer solution, Take 2 mLs (0.5 mg total) by nebulization in the morning and at bedtime., Disp: 120 mL, Rfl: 1   calcium carbonate (TUMS - DOSED IN MG ELEMENTAL CALCIUM) 500 MG chewable tablet, Chew 500-1,000 mg by mouth daily as needed for indigestion or heartburn., Disp: , Rfl:    carbamazepine (TEGRETOL) 200 MG tablet, Take 200 mg by mouth 3 (three) times daily as needed (trigeminal neuralgia)., Disp: , Rfl:    ferrous sulfate 325 (65 FE) MG tablet, Take 325 mg by mouth daily with breakfast., Disp: , Rfl:    fluticasone (FLONASE) 50 MCG/ACT nasal spray, SPRAY 2 SPRAYS INTO EACH NOSTRIL EVERY DAY, Disp: 48 mL, Rfl: 1   gabapentin (NEURONTIN) 100 MG capsule, Take 100 mg by mouth at bedtime as needed (pain)., Disp: , Rfl:    ipratropium-albuterol (DUONEB) 0.5-2.5 (3) MG/3ML SOLN, Take 3 mLs by nebulization every 6 (six) hours as needed., Disp: 360 mL, Rfl: 0   loratadine (CLARITIN) 10 MG tablet, Take 1 tablet (10 mg total) by mouth daily., Disp: 90 tablet, Rfl: 0   losartan (COZAAR) 25 MG tablet, TAKE 1 TABLET (25 MG TOTAL) BY MOUTH DAILY., Disp: 90 tablet, Rfl: 0   magnesium oxide (MAG-OX) 400 MG tablet, Take 400 mg by mouth daily., Disp: , Rfl:    metaxalone (SKELAXIN) 800 MG tablet,  Take 1 tablet (800 mg total) by mouth 3 (three) times daily as needed for muscle spasms., Disp: 90 tablet, Rfl: 0   Multiple Vitamins-Minerals (CENTRUM SILVER) tablet, Take 1 tablet by mouth daily., Disp: 30 tablet, Rfl: 0   oxyCODONE (OXY IR/ROXICODONE) 5 MG immediate release tablet, Take 1 tablet (5 mg total) by mouth every 4 (four) hours as needed for moderate pain (pain score 4-6)., Disp: 30 tablet, Rfl: 0   zinc gluconate 50 MG tablet, Take 50 mg by mouth daily., Disp: , Rfl:   Allergies  Allergen Reactions   Contrast Media [Iodinated Diagnostic Agents]     Unknown reaction   Shellfish Allergy     Edema    I personally reviewed active problem list, medication list, allergies, family history, social history, health maintenance with the patient/caregiver today.  ROS  Ten systems reviewed and is negative except as mentioned in HPI   Objective  Vitals:   07/25/21 1320  BP: 134/72  Pulse: 91  Resp: 16  Temp: 97.6 F (36.4 C)  SpO2: 98%  Weight: 207 lb (93.9 kg)  Height: 5\' 11"  (1.803 m)    Body mass index is 28.87 kg/m.  Physical Exam  Constitutional: Patient appears well-developed and well-nourished. Overweight.  No distress.  HEENT: head atraumatic, normocephalic, pupils equal and reactive to light, ears normal TM, neck supple Cardiovascular: Normal rate, regular rhythm and normal heart sounds.  No murmur heard. No BLE edema. Pulmonary/Chest: Effort normal and breath sounds normal. No respiratory distress. Abdominal: Soft.  There is no tenderness. Psychiatric: Patient has a normal mood and affect. behavior is normal. Judgment and thought content normal.   Recent Results (from the past 2160 hour(s))  Renal Function Panel     Status: Abnormal   Collection Time: 06/11/21 10:25 AM  Result Value Ref Range   Glucose, Bld 82 65 - 99 mg/dL    Comment: .            Fasting reference interval .    BUN 20 7 - 25 mg/dL   Creat 1.60 (H) 0.50 - 1.05 mg/dL    BUN/Creatinine Ratio 13 6 - 22 (calc)   Sodium 141 135 - 146 mmol/L   Potassium 4.8 3.5 - 5.3 mmol/L   Chloride 107 98 - 110 mmol/L   CO2 24 20 - 32 mmol/L   Calcium 9.6 8.6 - 10.4 mg/dL   Phosphorus 3.9 2.1 - 4.3 mg/dL   Albumin 4.0 3.6 - 5.1 g/dL     PHQ2/9: Depression screen [facility] 2/9 07/25/2021 04/10/2021 11/01/2020 10/12/2020 08/31/2020  Decreased Interest 0 0 0 0 0  Down, Depressed, Hopeless 0 0 0 0 0  PHQ - 2 Score 0 0 0 0 0  Altered sleeping 0 - - - 1  Tired, decreased energy 0 - - - 0  Change in appetite 0 - - - 0  Feeling bad or failure about yourself  0 - - - 0  Trouble concentrating 0 - - - 0  Moving slowly or fidgety/restless 0 - - - 0  Suicidal thoughts 0 - - - 0  PHQ-9 Score 0 - - - 1  Difficult doing work/chores - - - - Not difficult at all  Some recent data might be hidden    phq 9 is negative   Fall Risk: Fall Risk  07/25/2021 04/10/2021 11/01/2020 10/12/2020 08/31/2020  Falls in the past year? 0 0 1 1 0  Comment - - - - -  Number falls in past yr: 0 0 0 0 0  Injury with Fall? 0 0 0 0 0  Comment - - - - -  Risk for fall due to : No Fall Risks No Fall Risks History of fall(s) No Fall Risks -  Follow up Falls prevention discussed Falls prevention discussed Falls prevention discussed Falls prevention discussed -      Functional Status Survey: Is the patient deaf or have difficulty hearing?: No Does the patient have difficulty seeing, even when wearing glasses/contacts?: No Does the patient have difficulty concentrating, remembering, or making decisions?: No Does the patient have difficulty walking or climbing stairs?: No Does the patient have difficulty dressing or bathing?: No Does the patient have difficulty doing errands alone such as visiting a doctor's office or shopping?: No    Assessment & Plan  1.  Interstitial lung disease due to granulomatous disease ([place])  Keep follow up with pulmonologist   2. Mild intermittent asthma with acute  exacerbation  - montelukast (SINGULAIR) 10 MG tablet; Take 1 tablet (10 mg total) by mouth at bedtime.  Dispense: 30 tablet; Refill: 1 - Fluticasone-Umeclidin-Vilant (TRELEGY ELLIPTA) 100-62.5-25 MCG/ACT AEPB; Inhale 1 puff into the lungs daily.  Dispense: 1 each; Refill: 0 - albuterol (VENTOLIN HFA) 108 (90 Base) MCG/ACT inhaler; Inhale 2 puffs into the lungs every 6 (six) hours as needed for wheezing or shortness of breath.  Dispense: 18 g; Refill: 0

## 2021-07-25 ENCOUNTER — Ambulatory Visit (INDEPENDENT_AMBULATORY_CARE_PROVIDER_SITE_OTHER): Payer: Medicare Other | Admitting: Family Medicine

## 2021-07-25 ENCOUNTER — Encounter: Payer: Self-pay | Admitting: Family Medicine

## 2021-07-25 VITALS — BP 134/72 | HR 91 | Temp 97.6°F | Resp 16 | Ht 71.0 in | Wt 207.0 lb

## 2021-07-25 DIAGNOSIS — D718 Other functional disorders of polymorphonuclear neutrophils: Secondary | ICD-10-CM

## 2021-07-25 DIAGNOSIS — J8489 Other specified interstitial pulmonary diseases: Secondary | ICD-10-CM

## 2021-07-25 DIAGNOSIS — D71 Functional disorders of polymorphonuclear neutrophils: Secondary | ICD-10-CM | POA: Diagnosis not present

## 2021-07-25 DIAGNOSIS — J4521 Mild intermittent asthma with (acute) exacerbation: Secondary | ICD-10-CM | POA: Diagnosis not present

## 2021-07-25 MED ORDER — ALBUTEROL SULFATE HFA 108 (90 BASE) MCG/ACT IN AERS: 2.0000 | INHALATION_SPRAY | Freq: Four times a day (QID) | RESPIRATORY_TRACT | 0 refills | Status: DC | PRN

## 2021-07-25 MED ORDER — MONTELUKAST SODIUM 10 MG PO TABS: 10.0000 mg | ORAL_TABLET | Freq: Every day | ORAL | 1 refills | Status: DC

## 2021-07-25 MED ORDER — TRELEGY ELLIPTA 100-62.5-25 MCG/ACT IN AEPB: 1.0000 | INHALATION_SPRAY | Freq: Every day | RESPIRATORY_TRACT | 0 refills | Status: DC

## 2021-08-01 DIAGNOSIS — F411 Generalized anxiety disorder: Secondary | ICD-10-CM | POA: Diagnosis not present

## 2021-08-08 ENCOUNTER — Other Ambulatory Visit: Payer: Self-pay | Admitting: Family Medicine

## 2021-08-08 DIAGNOSIS — R319 Hematuria, unspecified: Secondary | ICD-10-CM | POA: Diagnosis not present

## 2021-08-08 DIAGNOSIS — I129 Hypertensive chronic kidney disease with stage 1 through stage 4 chronic kidney disease, or unspecified chronic kidney disease: Secondary | ICD-10-CM | POA: Diagnosis not present

## 2021-08-08 DIAGNOSIS — D631 Anemia in chronic kidney disease: Secondary | ICD-10-CM | POA: Diagnosis not present

## 2021-08-08 DIAGNOSIS — N1832 Chronic kidney disease, stage 3b: Secondary | ICD-10-CM | POA: Diagnosis not present

## 2021-08-08 DIAGNOSIS — J4521 Mild intermittent asthma with (acute) exacerbation: Secondary | ICD-10-CM

## 2021-08-11 ENCOUNTER — Encounter: Payer: Self-pay | Admitting: Family Medicine

## 2021-08-15 ENCOUNTER — Telehealth: Payer: Self-pay

## 2021-08-15 ENCOUNTER — Other Ambulatory Visit: Payer: Self-pay | Admitting: Family Medicine

## 2021-08-15 DIAGNOSIS — J4521 Mild intermittent asthma with (acute) exacerbation: Secondary | ICD-10-CM

## 2021-08-15 DIAGNOSIS — F411 Generalized anxiety disorder: Secondary | ICD-10-CM | POA: Diagnosis not present

## 2021-08-15 MED ORDER — TRELEGY ELLIPTA 100-62.5-25 MCG/ACT IN AEPB: 1.0000 | INHALATION_SPRAY | Freq: Every day | RESPIRATORY_TRACT | 2 refills | Status: DC

## 2021-08-15 NOTE — Telephone Encounter (Signed)
Copied from [place] [phone]. Topic: General - Other >> Aug 15, 2021 12:11 PM Tessa Lerner A wrote: Reason for CRM: The patient shares that the cough, they were previously seen by Dr. Ancil Boozer for, is continuing to create discomfort for them   The patient has completed their Fluticasone-Umeclidin-Vilant (TRELEGY ELLIPTA) 100-62.5-25 MCG/ACT AEPB [[id number]]  prescription and would like to continue taking the medication   Please contact further when possible

## 2021-08-29 DIAGNOSIS — F411 Generalized anxiety disorder: Secondary | ICD-10-CM | POA: Diagnosis not present

## 2021-09-03 DIAGNOSIS — Z20822 Contact with and (suspected) exposure to covid-19: Secondary | ICD-10-CM | POA: Diagnosis not present

## 2021-09-10 ENCOUNTER — Other Ambulatory Visit: Payer: Self-pay | Admitting: Acute Care

## 2021-09-10 ENCOUNTER — Other Ambulatory Visit: Payer: Self-pay

## 2021-09-10 ENCOUNTER — Encounter: Payer: Self-pay | Admitting: Acute Care

## 2021-09-10 ENCOUNTER — Ambulatory Visit (INDEPENDENT_AMBULATORY_CARE_PROVIDER_SITE_OTHER): Payer: Medicare Other | Admitting: Acute Care

## 2021-09-10 ENCOUNTER — Ambulatory Visit (INDEPENDENT_AMBULATORY_CARE_PROVIDER_SITE_OTHER): Payer: Medicare Other

## 2021-09-10 VITALS — BP 130/64 | HR 71 | Temp 97.6°F | Ht 71.0 in | Wt 208.6 lb

## 2021-09-10 DIAGNOSIS — R0600 Dyspnea, unspecified: Secondary | ICD-10-CM

## 2021-09-10 DIAGNOSIS — J45909 Unspecified asthma, uncomplicated: Secondary | ICD-10-CM

## 2021-09-10 DIAGNOSIS — R0602 Shortness of breath: Secondary | ICD-10-CM | POA: Diagnosis not present

## 2021-09-10 MED ORDER — PREDNISONE 10 MG PO TABS: ORAL_TABLET | ORAL | 0 refills | Status: DC

## 2021-09-10 NOTE — Patient Instructions (Signed)
It is good to see you today. We will do an ACE level today.  We will do a CXR today. Prednisone taper; 10 mg tablets: 4 tabs x 2 days, 3 tabs x 2 days, 2 tabs x 2 days 1 tab x 2 days then stop.  She needs PFT's and HRCT. She thinks this is scheduled already with her sarcoid MD.  Let me know when we see you in 2 weeks if you need PFT's or if you will get them at your next sarcoid appointment . Use Trelegy 1 puff once daily every day.  Use Albuterol for breakthrough shortness of breath or wheezing.  Follow up in 2 weeks with Judson Roch NP. Please contact office for sooner follow up if symptoms do not improve or worsen or seek emergency care

## 2021-09-10 NOTE — Progress Notes (Signed)
History of Present Illness Melissa [name] is a 71 y.o. female never smoker with OSA on CPAP, history of asthma as a child. She had COVID 19  04/2019. She did not require hospitalization, but she lost her husband to the virus. She has  having post COVID dyspnea . 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 She is followed by Dr. Mortimer [name]  Pets: Dog Occupation: Retired post Sales promotion account executive Exposures: No known exposures.  No mold, hot tub, Jacuzzi.  No feather pillows or comforters Smoking history: Never smoker Travel history: From [place].  New to [place] in 2005 Relevant family history: Brother had lung cancer.  He was a smoker.  CT scan  with peribronchovascular nodularity, apical scarring with lymph nodes that is consistent with sarcoid. ( Previously diagnosed with biopsy She has mild interstitial changes at the bases which appears nonspecific but will require close monitoring to make sure she is not developing other kinds of ILD's. She was seen by Dr. Chase [name] 06/2020, at that time PFT's were normal , and she did not need treatment . Recommendation was for HRCT and PFT's  in 6 months, neither was done.  She is being followed for her sarcoid by a physician in [facility]. She does not remember name, and I do not see a note from [facility] with exception of her rheumatologist, who she says is not her sarcoid MD.    09/10/2021 Pt. Presents for follow up. She states she has had increased sensitivity to smells that cause her to cough.She has been using her albuterol inhaler if she needs this. She did get a Trelegy form her PCP, but she has not been using this. She states when she used this it did not make any difference.  She also feels a chest tightness that her PCP feels may be muscle strain from exercising. She has cough so it could also be as a results of her cough. She works out 4 times a week, and she is able to work out without difficulty. She states secretions are minimal and clear. No  fever, no wheezing. FENO in the office today is 91 ppb. With the scent triggers and elevated FENO, suspect this may be asthmatic in nature. Will check CXR and ACE level also.   She is having trouble with the noise her CPAP makes and she is wondering about [employer]. Her BMI is 29 today so she does meet BMI requirement. Will evaluate her initial sleep studyu at follow up to determine if she qualifies to be referred for [facility].   FENO today in the office is 91 ppb.   Test Results: PFTs: 04/07/2020 FVC 4.61 [140%), FEV1 3.55 [137%], F/F 77, TLC 7.48 [122%], DLCO 20.30 [82%] Normal test   Labs: CTD serologies 04/17/2020-ANA 1:40, nuclear speckled  CBC Latest Ref Rng & Units 10/18/2020 10/10/2020 05/11/2020  WBC 4.0 - 10.5 K/uL - 5.3 5.7  Hemoglobin 12.0 - 15.0 g/dL 11.6(L) 12.2 11.8(L)  Hematocrit 36.0 - 46.0 % 34.0(L) 36.8 35.8(L)  Platelets 150 - 400 K/uL - 234 221    BMP Latest Ref Rng & Units 06/11/2021 04/10/2021 10/18/2020  Glucose 65 - 99 mg/dL 82 86 54(L)  BUN 7 - 25 mg/dL 20 31(H) 23  Creatinine 0.50 - 1.05 mg/dL 1.60(H) 1.64(H) 1.60(H)  BUN/Creat Ratio 6 - 22 (calc) 13 19 -  Sodium 135 - 146 mmol/L 141 139 143  Potassium 3.5 - 5.3 mmol/L 4.8 4.5 3.5  Chloride 98 - 110 mmol/L 107  104 106  CO2 20 - 32 mmol/L 24 24 -  Calcium 8.6 - 10.4 mg/dL 9.6 9.7 -    BNP No results found for: BNP  ProBNP No results found for: PROBNP  PFT    Component Value Date/Time   FEV1PRE 3.54 04/07/2020 1148   FEV1POST 3.55 04/07/2020 1148   FVCPRE 4.71 04/07/2020 1148   FVCPOST 4.61 04/07/2020 1148   TLC 7.48 04/07/2020 1148   DLCOUNC 20.30 04/07/2020 1148   PREFEV1FVCRT 75 04/07/2020 1148   PSTFEV1FVCRT 77 04/07/2020 1148    No results found.   Past medical hx Past Medical History:  Diagnosis Date   Aortic atherosclerosis ([place])    Bruising    Cardiomyopathy ([place])    Celiac artery aneurysm (HCC)    Cervical radiculopathy    CKD (chronic kidney disease), stage III (HCC)     Deficiency of vitamin B    Depression    Edema    Flank pain    Gross hematuria    History of 2019 novel coronavirus disease (COVID-19) 05/01/2019   History of bariatric surgery    HLD (hyperlipidemia)    HTN (hypertension)    Interstitial lung disease (HCC)    Iron deficiency anemia    Leukopenia    OA (osteoarthritis)    Obesity    OSA on CPAP    Osteopenia    Perennial allergic rhinitis    Pneumonia due to COVID-19 virus 2020   Renal cysts, acquired, bilateral    RLS (restless legs syndrome)    Sarcoidosis    Sickle cell trait (HCC)    Snoring    Spinal stenosis    Trigeminal neuralgia      Social History   Tobacco Use   Smoking status: Never   Smokeless tobacco: Never   Tobacco comments:    Second hand smoke exposure  Vaping Use   Vaping Use: Never used  Substance Use Topics   Alcohol use: Not Currently    Alcohol/week: 0.0 standard drinks   Drug use: No    Ms.Melissa [name] reports that she has never smoked. She has never used smokeless tobacco. She reports that she does not currently use alcohol. She reports that she does not use drugs.  Tobacco Cessation: Never smoker Past surgical hx, Family hx, Social hx all reviewed.  Current Outpatient Medications on File Prior to Visit  Medication Sig   acetaminophen (TYLENOL) 650 MG CR tablet Take 1,300 mg by mouth every 8 (eight) hours as needed for pain.   albuterol (VENTOLIN HFA) 108 (90 Base) MCG/ACT inhaler Inhale 2 puffs into the lungs every 6 (six) hours as needed for wheezing or shortness of breath.   Ascorbic Acid (VITAMIN C) 1000 MG tablet Take 1,000 mg by mouth daily.   atorvastatin (LIPITOR) 40 MG tablet TAKE 1 TABLET BY MOUTH EVERY DAY   azelastine (ASTELIN) 0.1 % nasal spray 2 SPRAYS IN EACH NOSTRIL DAILY   budesonide (PULMICORT) 0.5 MG/2ML nebulizer solution Take 2 mLs (0.5 mg total) by nebulization in the morning and at bedtime.   calcium carbonate (TUMS - DOSED IN MG ELEMENTAL CALCIUM) 500 MG chewable  tablet Chew 500-1,000 mg by mouth daily as needed for indigestion or heartburn.   ferrous sulfate 325 (65 FE) MG tablet Take 325 mg by mouth daily with breakfast.   fluticasone (FLONASE) 50 MCG/ACT nasal spray SPRAY 2 SPRAYS INTO EACH NOSTRIL EVERY DAY   Fluticasone-Umeclidin-Vilant (TRELEGY ELLIPTA) 100-62.5-25 MCG/ACT AEPB Inhale 1 puff into the lungs daily.  gabapentin (NEURONTIN) 100 MG capsule Take 100 mg by mouth at bedtime as needed (pain).   ipratropium-albuterol (DUONEB) 0.5-2.5 (3) MG/3ML SOLN Take 3 mLs by nebulization every 6 (six) hours as needed.   loratadine (CLARITIN) 10 MG tablet Take 1 tablet (10 mg total) by mouth daily.   losartan (COZAAR) 25 MG tablet TAKE 1 TABLET (25 MG TOTAL) BY MOUTH DAILY.   magnesium oxide (MAG-OX) 400 MG tablet Take 400 mg by mouth daily.   metaxalone (SKELAXIN) 800 MG tablet Take 1 tablet (800 mg total) by mouth 3 (three) times daily as needed for muscle spasms.   montelukast (SINGULAIR) 10 MG tablet TAKE 1 TABLET BY MOUTH EVERYDAY AT BEDTIME   Multiple Vitamins-Minerals (CENTRUM SILVER) tablet Take 1 tablet by mouth daily.   oxyCODONE (OXY IR/ROXICODONE) 5 MG immediate release tablet Take 1 tablet (5 mg total) by mouth every 4 (four) hours as needed for moderate pain (pain score 4-6).   zinc gluconate 50 MG tablet Take 50 mg by mouth daily.   No current facility-administered medications on file prior to visit.     Allergies  Allergen Reactions   Contrast Media [Iodinated Contrast Media]     Unknown reaction   Shellfish Allergy     Edema    Review Of Systems:  Constitutional:   No  weight loss, night sweats,  Fevers, chills, fatigue, or  lassitude.  HEENT:   No headaches,  Difficulty swallowing,  Tooth/dental problems, or  Sore throat,                No sneezing, itching, ear ache, nasal congestion, post nasal drip,   CV:  No chest pain,  Orthopnea, PND, swelling in lower extremities, anasarca, dizziness, palpitations, syncope.   GI   No heartburn, indigestion, abdominal pain, nausea, vomiting, diarrhea, change in bowel habits, loss of appetite, bloody stools.   Resp: + shortness of breath with exertion less at rest.  Minimal  excess mucus, no productive cough,  No non-productive cough,  No coughing up of blood.  No change in color of mucus.  No wheezing.  No chest wall deformity  Skin: no rash or lesions.  GU: no dysuria, change in color of urine, no urgency or frequency.  No flank pain, no hematuria   MS:  No joint pain or swelling.  No decreased range of motion.  No back pain.  Psych:  No change in mood or affect. No depression or anxiety.  No memory loss.   Vital Signs BP 130/64 (BP Location: Left Arm, Patient Position: Sitting, Cuff Size: Normal)    Pulse 71    Temp 97.6 F (36.4 C) (Oral)    Ht 5\' 11"  (1.803 m)    Wt 208 lb 9.6 oz (94.6 kg)    SpO2 93%    BMI 29.09 kg/m    Physical Exam:  General- No distress,  A&Ox3, pleasant ENT: No sinus tenderness, TM clear, pale nasal mucosa, no oral exudate,no post nasal drip, no LAN Cardiac: S1, S2, regular rate and rhythm, no murmur Chest: No wheeze/ rales/ dullness; no accessory muscle use, no nasal flaring, no sternal retractions Abd.: Soft Non-tender, ND, BS +, Body mass index is 29.09 kg/m.  Ext: No clubbing cyanosis, edema Neuro:  normal strength, MAE x 4, A&O x 3. appropriate Skin: No rashes, warm and dry, no lesions  Psych: normal mood and behavior   Assessment/Plan Acute onset dyspnea triggered by smells  FENO 91 PPB>> Suspect asthmatic flare  Hx Sarcoid  and ILD Plan We will do an ACE level today.  We will do a CXR today. Prednisone taper; 10 mg tablets: 4 tabs x 2 days, 3 tabs x 2 days, 2 tabs x 2 days 1 tab x 2 days then stop.  She needs PFT's and HRCT. She thinks this is scheduled already with her sarcoid MD.  Let me know when we see you in 2 weeks if you need PFT's or if you will get them at your next sarcoid appointment . Use Trelegy 1 puff  once daily every day.  Use Albuterol for breakthrough shortness of breath or wheezing.  Follow up in 2 weeks with Judson Roch NP. Please contact office for sooner follow up if symptoms do not improve or worsen or seek emergency care    I spent 40 minutes dedicated to the care of this patient on the date of this encounter to include pre-visit review of records, face-to-face time with the patient discussing conditions above, post visit ordering of testing, clinical documentation with the electronic health record, making appropriate referrals as documented, and communicating necessary information to the patient's healthcare team.    Magdalen Spatz, NP 09/10/2021  1:44 PM

## 2021-09-11 ENCOUNTER — Telehealth: Payer: Self-pay | Admitting: Acute Care

## 2021-09-11 NOTE — Telephone Encounter (Signed)
Please call patient with the results of her chest x-ray.  Let her know that chest x-ray shows nodular densities in the right upper lobe that are likely a chronic inflammatory process.  We have treated her with prednisone which should help manage this.  We will discuss at follow-up if we want to get a CT scan. Please have her follow the recommendations she was given at her September 10, 2021 appointment.  We will discuss whether or not we need a CT chest at follow-up in 2 weeks.  Thanks so much.

## 2021-09-12 DIAGNOSIS — F411 Generalized anxiety disorder: Secondary | ICD-10-CM | POA: Diagnosis not present

## 2021-09-12 LAB — ANGIOTENSIN CONVERTING ENZYME: Angiotensin-Converting Enzyme: 83 U/L — ABNORMAL HIGH (ref 9–67)

## 2021-09-12 NOTE — Telephone Encounter (Signed)
I called the patient and she voices understanding. Nothing further needed.  °

## 2021-09-13 ENCOUNTER — Other Ambulatory Visit: Payer: Self-pay | Admitting: Family Medicine

## 2021-09-13 DIAGNOSIS — Z1231 Encounter for screening mammogram for malignant neoplasm of breast: Secondary | ICD-10-CM

## 2021-09-21 ENCOUNTER — Ambulatory Visit: Payer: Medicare Other | Admitting: Pulmonary Disease

## 2021-09-21 DIAGNOSIS — H40033 Anatomical narrow angle, bilateral: Secondary | ICD-10-CM | POA: Diagnosis not present

## 2021-09-26 DIAGNOSIS — F411 Generalized anxiety disorder: Secondary | ICD-10-CM | POA: Diagnosis not present

## 2021-09-28 ENCOUNTER — Other Ambulatory Visit: Payer: Self-pay | Admitting: Family Medicine

## 2021-09-28 ENCOUNTER — Other Ambulatory Visit: Payer: Self-pay

## 2021-09-28 ENCOUNTER — Ambulatory Visit (INDEPENDENT_AMBULATORY_CARE_PROVIDER_SITE_OTHER): Payer: Medicare Other | Admitting: Acute Care

## 2021-09-28 ENCOUNTER — Encounter: Payer: Self-pay | Admitting: Acute Care

## 2021-09-28 VITALS — BP 136/82 | HR 81 | Temp 97.7°F | Ht 71.0 in | Wt 206.8 lb

## 2021-09-28 DIAGNOSIS — J4521 Mild intermittent asthma with (acute) exacerbation: Secondary | ICD-10-CM | POA: Diagnosis not present

## 2021-09-28 DIAGNOSIS — I700 Atherosclerosis of aorta: Secondary | ICD-10-CM

## 2021-09-28 DIAGNOSIS — I10 Essential (primary) hypertension: Secondary | ICD-10-CM

## 2021-09-28 DIAGNOSIS — J45909 Unspecified asthma, uncomplicated: Secondary | ICD-10-CM | POA: Diagnosis not present

## 2021-09-28 LAB — NITRIC OXIDE: FeNO level (ppb): 63

## 2021-09-28 MED ORDER — ALBUTEROL SULFATE HFA 108 (90 BASE) MCG/ACT IN AERS: 2.0000 | INHALATION_SPRAY | Freq: Four times a day (QID) | RESPIRATORY_TRACT | 0 refills | Status: DC | PRN

## 2021-09-28 MED ORDER — PREDNISONE 10 MG PO TABS: ORAL_TABLET | ORAL | 0 refills | Status: DC

## 2021-09-28 NOTE — Telephone Encounter (Signed)
Patient must keep upcoming appointment for further refills. Requested Prescriptions  Pending Prescriptions Disp Refills   losartan (COZAAR) 25 MG tablet [Pharmacy Med Name: LOSARTAN POTASSIUM 25 MG TAB] 90 tablet 0    Sig: TAKE 1 TABLET (25 MG TOTAL) BY MOUTH DAILY.     Cardiovascular:  Angiotensin Receptor Blockers Failed - 09/28/2021  1:44 AM      Failed - Cr in normal range and within 180 days    Creat  Date Value Ref Range Status  06/11/2021 1.60 (H) 0.50 - 1.05 mg/dL Final         Passed - K in normal range and within 180 days    Potassium  Date Value Ref Range Status  06/11/2021 4.8 3.5 - 5.3 mmol/L Final  04/15/2014 4.4 3.5 - 5.1 mmol/L Final         Passed - Patient is not pregnant      Passed - Last BP in normal range    BP Readings from Last 1 Encounters:  09/10/21 130/64         Passed - Valid encounter within last 6 months    Recent Outpatient Visits          2 months ago Interstitial lung disease due to granulomatous disease [facility])   [facility] [place], Drue Stager, MD   5 months ago Interstitial lung disease due to granulomatous disease [facility])   [facility] Steele Sizer, MD   11 months ago Insomnia, unspecified type   [facility] Steele Sizer, MD   1 year ago Chest pain, atypical   [facility] Steele Sizer, MD   1 year ago Chronic cough   [facility] Steele Sizer, MD      Future Appointments            Today Magdalen Spatz, NP [facility]   In 1 week Steele Sizer, MD [facility], [place]   In 2 weeks  [facility], [facility]

## 2021-09-28 NOTE — Progress Notes (Signed)
History of Present Illness Melissa [name] is a 71 y.o. female  never smoker with OSA on CPAP, history of asthma as a child. She had COVID 19  04/2019. She did not require hospitalization, but she lost her husband to the virus. She has  having post COVID dyspnea . 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 She is followed by Dr. Mortimer Fries   09/28/2021 Pt. Presents for follow up after suspected flare of her sarcoid or her asthma. She was treated with prednisone taper and she has recovered. She rarely coughs, no dyspnea even with exercise. She is back to her baseline. She does still have some discomfort in her right upper chest which her PCP feels is musculoskeletal.She does have follow up with her sarcoid specialist in [facility] 10/26/2021. She will check to see if they are going to do a CT scan. If not we will do one at 3 month follow up.   FENO was 91 ppb at previous visit during flare.  FENO today is 63 PPB We will continue Trelegy and repeat prednisone taper.  Follow up in 1 month after treatment with pred taper.   Test Results: CXR 09/10/2021 Bilateral ill-defined nodular densities predominantly in the right upper lobe, likely chronic inflammatory process. No new consolidation. There is no pleural effusion pneumothorax. The cardiac silhouette is within normal limits. No acute osseous pathology. Degenerative changes of the spine.   IMPRESSION: No active cardiopulmonary disease.  CBC Latest Ref Rng & Units 10/18/2020 10/10/2020 05/11/2020  WBC 4.0 - 10.5 K/uL - 5.3 5.7  Hemoglobin 12.0 - 15.0 g/dL 11.6(L) 12.2 11.8(L)  Hematocrit 36.0 - 46.0 % 34.0(L) 36.8 35.8(L)  Platelets 150 - 400 K/uL - 234 221    BMP Latest Ref Rng & Units 06/11/2021 04/10/2021 10/18/2020  Glucose 65 - 99 mg/dL 82 86 54(L)  BUN 7 - 25 mg/dL 20 31(H) 23  Creatinine 0.50 - 1.05 mg/dL 1.60(H) 1.64(H) 1.60(H)  BUN/Creat Ratio 6 - 22 (calc) 13 19 -  Sodium 135 - 146 mmol/L 141 139 143  Potassium 3.5 - 5.3 mmol/L 4.8 4.5  3.5  Chloride 98 - 110 mmol/L 107 104 106  CO2 20 - 32 mmol/L 24 24 -  Calcium 8.6 - 10.4 mg/dL 9.6 9.7 -    BNP No results found for: BNP  ProBNP No results found for: PROBNP  PFT    Component Value Date/Time   FEV1PRE 3.54 04/07/2020 1148   FEV1POST 3.55 04/07/2020 1148   FVCPRE 4.71 04/07/2020 1148   FVCPOST 4.61 04/07/2020 1148   TLC 7.48 04/07/2020 1148   DLCOUNC 20.30 04/07/2020 1148   PREFEV1FVCRT 75 04/07/2020 1148   PSTFEV1FVCRT 77 04/07/2020 1148    DG Chest 2 View  Result Date: 09/11/2021 CLINICAL DATA:  Shortness of breath. EXAM: CHEST - 2 VIEW COMPARISON:  Chest radiograph dated 09/07/2019. FINDINGS: Bilateral ill-defined nodular densities predominantly in the right upper lobe, likely chronic inflammatory process. No new consolidation. There is no pleural effusion pneumothorax. The cardiac silhouette is within normal limits. No acute osseous pathology. Degenerative changes of the spine. IMPRESSION: No active cardiopulmonary disease. Electronically Signed   By: Anner Crete M.D.   On: 09/11/2021 02:33     Past medical hx Past Medical History:  Diagnosis Date   Aortic atherosclerosis ([place])    Bruising    Cardiomyopathy ([place])    Celiac artery aneurysm (HCC)    Cervical radiculopathy    CKD (chronic kidney disease), stage III ([place])  Deficiency of vitamin B    Depression    Edema    Flank pain    Gross hematuria    History of 2019 novel coronavirus disease (COVID-19) 05/01/2019   History of bariatric surgery    HLD (hyperlipidemia)    HTN (hypertension)    Interstitial lung disease (HCC)    Iron deficiency anemia    Leukopenia    OA (osteoarthritis)    Obesity    OSA on CPAP    Osteopenia    Perennial allergic rhinitis    Pneumonia due to COVID-19 virus 2020   Renal cysts, acquired, bilateral    RLS (restless legs syndrome)    Sarcoidosis    Sickle cell trait (HCC)    Snoring    Spinal stenosis    Trigeminal neuralgia      Social History    Tobacco Use   Smoking status: Never   Smokeless tobacco: Never   Tobacco comments:    Second hand smoke exposure  Vaping Use   Vaping Use: Never used  Substance Use Topics   Alcohol use: Not Currently    Alcohol/week: 0.0 standard drinks   Drug use: No    Ms.Linderman reports that she has never smoked. She has never used smokeless tobacco. She reports that she does not currently use alcohol. She reports that she does not use drugs.  Tobacco Cessation: Never smoker  + second hand smoke exposure  Past surgical hx, Family hx, Social hx all reviewed.  Current Outpatient Medications on File Prior to Visit  Medication Sig   acetaminophen (TYLENOL) 650 MG CR tablet Take 1,300 mg by mouth every 8 (eight) hours as needed for pain.   albuterol (VENTOLIN HFA) 108 (90 Base) MCG/ACT inhaler Inhale 2 puffs into the lungs every 6 (six) hours as needed for wheezing or shortness of breath.   Ascorbic Acid (VITAMIN C) 1000 MG tablet Take 1,000 mg by mouth daily.   atorvastatin (LIPITOR) 40 MG tablet TAKE 1 TABLET BY MOUTH EVERY DAY   azelastine (ASTELIN) 0.1 % nasal spray 2 SPRAYS IN EACH NOSTRIL DAILY   budesonide (PULMICORT) 0.5 MG/2ML nebulizer solution Take 2 mLs (0.5 mg total) by nebulization in the morning and at bedtime.   calcium carbonate (TUMS - DOSED IN MG ELEMENTAL CALCIUM) 500 MG chewable tablet Chew 500-1,000 mg by mouth daily as needed for indigestion or heartburn.   ferrous sulfate 325 (65 FE) MG tablet Take 325 mg by mouth daily with breakfast.   fluticasone (FLONASE) 50 MCG/ACT nasal spray SPRAY 2 SPRAYS INTO EACH NOSTRIL EVERY DAY   Fluticasone-Umeclidin-Vilant (TRELEGY ELLIPTA) 100-62.5-25 MCG/ACT AEPB Inhale 1 puff into the lungs daily.   gabapentin (NEURONTIN) 100 MG capsule Take 100 mg by mouth at bedtime as needed (pain).   ipratropium-albuterol (DUONEB) 0.5-2.5 (3) MG/3ML SOLN Take 3 mLs by nebulization every 6 (six) hours as needed.   loratadine (CLARITIN) 10 MG tablet Take  1 tablet (10 mg total) by mouth daily.   magnesium oxide (MAG-OX) 400 MG tablet Take 400 mg by mouth daily.   metaxalone (SKELAXIN) 800 MG tablet Take 1 tablet (800 mg total) by mouth 3 (three) times daily as needed for muscle spasms.   montelukast (SINGULAIR) 10 MG tablet TAKE 1 TABLET BY MOUTH EVERYDAY AT BEDTIME   Multiple Vitamins-Minerals (CENTRUM SILVER) tablet Take 1 tablet by mouth daily.   oxyCODONE (OXY IR/ROXICODONE) 5 MG immediate release tablet Take 1 tablet (5 mg total) by mouth every 4 (four) hours as needed  for moderate pain (pain score 4-6).   predniSONE (DELTASONE) 10 MG tablet Prednisone taper; 10 mg tablets: 4 tabs x 2 days, 3 tabs x 2 days, 2 tabs x 2 days 1 tab x 2 days then stop.   zinc gluconate 50 MG tablet Take 50 mg by mouth daily.   No current facility-administered medications on file prior to visit.     Allergies  Allergen Reactions   Contrast Media [Iodinated Contrast Media]     Unknown reaction   Shellfish Allergy     Edema    Review Of Systems:  Constitutional:   No  weight loss, night sweats,  Fevers, chills, fatigue, or  lassitude.  HEENT:   No headaches,  Difficulty swallowing,  Tooth/dental problems, or  Sore throat,                No sneezing, itching, ear ache, nasal congestion, post nasal drip,   CV:  No chest pain,  Orthopnea, PND, swelling in lower extremities, anasarca, dizziness, palpitations, syncope.   GI  No heartburn, indigestion, abdominal pain, nausea, vomiting, diarrhea, change in bowel habits, loss of appetite, bloody stools.   Resp: No shortness of breath with exertion or at rest.  No excess mucus, no productive cough,  No non-productive cough,  No coughing up of blood.  No change in color of mucus.  No wheezing.  No chest wall deformity  Skin: no rash or lesions.  GU: no dysuria, change in color of urine, no urgency or frequency.  No flank pain, no hematuria   MS:  No joint pain or swelling.  No decreased range of motion.  No  back pain.  Psych:  No change in mood or affect. No depression or anxiety.  No memory loss.   Vital Signs BP 136/82 (BP Location: Left Arm, Cuff Size: Normal)    Pulse 81    Temp 97.7 F (36.5 C) (Oral)    Ht 5\' 11"  (1.803 m)    Wt 206 lb 12.8 oz (93.8 kg)    SpO2 98%    BMI 28.84 kg/m    Physical Exam:  General- No distress,  A&Ox3, pleasant ENT: No sinus tenderness, TM clear, pale nasal mucosa, no oral exudate,no post nasal drip, no LAN Cardiac: S1, S2, regular rate and rhythm, no murmur Chest: No wheeze/ rales/ dullness; no accessory muscle use, no nasal flaring, no sternal retractions Abd.: Soft Non-tender, ND, BS +, Body mass index is 28.84 kg/m. Ext: No clubbing cyanosis, edema Neuro:  normal strength, MAE x 4, A&O Skin: No rashes, warm and dry, no lesions Psych: normal mood and behavior   Assessment/Plan  Resolved asthma/ Sarcoid Flare Plan We will do a FENO today.  We will send in a prescription for Trelegy Use 1 puff once daily , every day without fail. Rinse mouth after use.  We will send in a prescription for rescue inhaler ( Albuterol) Pro Air Use as needed for breakthrough shortness of breath or wheezing Rinse mouth after use.  Follow up with Judson Roch NP, or Dr. Mortimer Fries in 3 months  Follow up with sarcoid specialist as is scheduled 10/26/2021  OSA on CPAP Compliant every night  Plan Continue on CPAP at bedtime. You appear to be benefiting from the treatment  Goal is to wear for at least 6 hours each night for maximal clinical benefit. Continue to work on weight loss, as the link between excess weight  and sleep apnea is well established.   Remember to establish  a good bedtime routine, and work on sleep hygiene.  Limit daytime naps , avoid stimulants such as caffeine and nicotine close to bedtime, exercise daily to promote sleep quality, avoid heavy , spicy, fried , or rich foods before bed. Ensure adequate exposure to natural light during the day,establish a  relaxing bedtime routine with a pleasant sleep environment ( Bedroom between 60 and 67 degrees, turn off bright lights , TV or device screens screens , consider black out curtains or white noise machines) Do not drive if sleepy. Remember to clean mask, tubing, filter, and reservoir once weekly with soapy water.  Follow up with  Dr. Mortimer Fries   In 6 months  or before as needed.    I spent 45 minutes dedicated to the care of this patient on the date of this encounter to include pre-visit review of records, face-to-face time with the patient discussing conditions above, post visit ordering of testing, clinical documentation with the electronic health record, making appropriate referrals as documented, and communicating necessary information to the patient's healthcare team.     Magdalen Spatz, NP 09/28/2021  10:13 AM

## 2021-09-28 NOTE — Patient Instructions (Addendum)
It is great to see you today. We will do a FENO today.  We will send in a prescription for Trelegy Use 1 puff once daily , every day without fail. Rinse mouth after use.  We will send in a prescription for rescue inhaler ( Albuterol) Pro Air Use as needed for breakthrough shortness of breath or wheezing Rinse mouth after use.  Follow up with Judson Roch NP, or Dr. Mortimer Fries in 1 months  Continue on CPAP at bedtime. You appear to be benefiting from the treatment  Goal is to wear for at least 6 hours each night for maximal clinical benefit. Continue to work on weight loss, as the link between excess weight  and sleep apnea is well established.   Remember to establish a good bedtime routine, and work on sleep hygiene.  Limit daytime naps , avoid stimulants such as caffeine and nicotine close to bedtime, exercise daily to promote sleep quality, avoid heavy , spicy, fried , or rich foods before bed. Ensure adequate exposure to natural light during the day,establish a relaxing bedtime routine with a pleasant sleep environment ( Bedroom between 60 and 67 degrees, turn off bright lights , TV or device screens screens , consider black out curtains or white noise machines) Do not drive if sleepy. Remember to clean mask, tubing, filter, and reservoir once weekly with soapy water.  Follow up with  Dr. Mortimer Fries   In 6 months  or before as needed.

## 2021-09-29 DIAGNOSIS — Z20822 Contact with and (suspected) exposure to covid-19: Secondary | ICD-10-CM | POA: Diagnosis not present

## 2021-10-01 ENCOUNTER — Ambulatory Visit
Admission: RE | Admit: 2021-10-01 | Discharge: 2021-10-01 | Disposition: A | Discharge status: Home / Self Care | Payer: Medicare Other | Source: Ambulatory Visit | Attending: Family Medicine | Admitting: Family Medicine

## 2021-10-01 ENCOUNTER — Other Ambulatory Visit: Payer: Self-pay

## 2021-10-01 DIAGNOSIS — Z1231 Encounter for screening mammogram for malignant neoplasm of breast: Secondary | ICD-10-CM | POA: Diagnosis not present

## 2021-10-03 DIAGNOSIS — I288 Other diseases of pulmonary vessels: Secondary | ICD-10-CM | POA: Diagnosis not present

## 2021-10-03 DIAGNOSIS — D869 Sarcoidosis, unspecified: Secondary | ICD-10-CM | POA: Diagnosis not present

## 2021-10-03 DIAGNOSIS — R590 Localized enlarged lymph nodes: Secondary | ICD-10-CM | POA: Diagnosis not present

## 2021-10-03 DIAGNOSIS — D860 Sarcoidosis of lung: Secondary | ICD-10-CM | POA: Diagnosis not present

## 2021-10-03 DIAGNOSIS — J984 Other disorders of lung: Secondary | ICD-10-CM | POA: Diagnosis not present

## 2021-10-05 DIAGNOSIS — Z20822 Contact with and (suspected) exposure to covid-19: Secondary | ICD-10-CM | POA: Diagnosis not present

## 2021-10-05 NOTE — Progress Notes (Unsigned)
Name: Melissa [name]   MRN: [id number]    DOB: 1950/11/01   Date:10/05/2021       Progress Note  Subjective  Chief Complaint  Follow Up  HPI  Atherosclerosis aorta and CAD/Celiac artery aneurysm : found on CT chest done 2021. She has been on statin therapy and aspirin, sees cardiologist , Dr. Rockey Situ yearly. Last CT renal stone was 06/22 and size of aneurysm 15 mm and stable, monitor every 2 -3 years.   OSA : she sees pulmonologist, wears CPAP every night.   Sarcoidosis/Asthma and chronic bronchitis: under the care of Dr. Regan Lemming at [facility], having yearly CT scans, not on any medications at this time. Had multiple labs earlier this year seen by Rheumatologist since she also had joint aches and was given reassurance and advised to take Tylenol prn   CKI stage III: history of secondary hyperparathyroidism, under the care of nephrologist, last urine micro normal GFR 39. No itching, normal urine output. No anemia  Sickle cell trait: asymptomatic.   Depression Major Depression:she took medications for a while, it was present for a few years, carrying for her mother, also stress before retiring, but doing well, off medications, in remission  Cervical radiculitis: she used to get injections by Dr. Phyllis Ginger, symptoms used to be on the right side. She states this episode is on the left side, started mid day, it is described as spasms when she moves her neck, otherwise a dull aching and constant pain. It does not radiate, she can only take tylenol but not controlling symptoms at this time. She does not have muscle relaxer at home.   Arthralgias: she has aches and pains all over her body, lower back, outer hips, left knee pain, she would like to go back to Dr. Phyllis Ginger and advised her to discuss pain management   Patient Active Problem List   Diagnosis Date Noted   Hematuria 01/03/2021   Total knee replacement status 10/18/2020   Primary osteoarthritis of right hip 08/06/2020   Sarcoidosis 05/29/2020    Aneurysm of celiac artery ([place]) 02/11/2020   COVID-19 virus infection 09/06/2019   Localized swelling of right lower leg 09/06/2019   OSA on CPAP 09/06/2019   Secondary hyperparathyroidism of renal origin ([place]) 07/21/2019   Anemia in chronic kidney disease 06/14/2019   Benign hypertensive kidney disease with chronic kidney disease 06/14/2019   Sickle cell trait ([place]) 06/14/2019   Chronic kidney disease, stage 3b ([place]) 06/14/2019   Chronic bronchitis ([place]) 05/19/2019   Chronic venous insufficiency 04/15/2019   Lymphedema 04/15/2019   Swelling of limb 03/30/2019   Family history of colon cancer    Trigeminal neuralgia 09/11/2018   Cardiac LV ejection fraction of 40-49% 05/07/2017   Low back pain without sciatica 02/21/2017   Renal cyst 10/18/2016   Atherosclerosis of aorta ([place]) 10/16/2016   History of colonic polyps 09/25/2016   Dyslipidemia 12/08/2015   Gastroesophageal reflux disease without esophagitis 08/11/2015   Primary osteoarthritis of knee 06/13/2015   Hip bursitis 06/13/2015   RLS (restless legs syndrome) 06/13/2015   Depression, major, in remission ([place]) 06/13/2015   B12 deficiency 06/13/2015   History of Roux-en-Y gastric bypass 06/13/2015   History of iron deficiency anemia 06/13/2015   Vitamin D deficiency 06/13/2015   Hypertension, benign 06/13/2015   Perennial allergic rhinitis with seasonal variation 06/13/2015   Obesity (BMI 30.0-34.9) 06/13/2015   Primary osteoarthritis of both knees 06/13/2015   Cervical radiculitis 01/04/2014   Cervical spinal stenosis 01/04/2014   Cervical  osteoarthritis 01/04/2014    Past Surgical History:  Procedure Laterality Date   BARIATRIC SURGERY     CESAREAN SECTION     3 or more   COLONOSCOPY WITH PROPOFOL N/A 03/15/2019   Procedure: COLONOSCOPY WITH PROPOFOL;  Surgeon: [name], MD;  Location: [facility] ENDOSCOPY;  Service: Gastroenterology;  Laterality: N/A;   GASTRIC BYPASS     KNEE ARTHROPLASTY Right 10/18/2020    Procedure: COMPUTER ASSISTED TOTAL KNEE ARTHROPLASTY;  Surgeon: Dereck Leep, MD;  Location: [facility] ORS;  Service: Orthopedics;  Laterality: Right;   SHOULDER ARTHROSCOPY Right    TUBAL LIGATION      Family History  Problem Relation Age of Onset   Hypercholesterolemia Mother    Heart disease Mother    Hypertension Mother    Alcohol abuse Father    Lung cancer Brother    Alcohol abuse Brother    Diabetes Mellitus II Sister    Hypertension Maternal Grandmother    Colon cancer Sister    Breast cancer Neg Hx    Prostate cancer Neg Hx    Bladder Cancer Neg Hx    Kidney cancer Neg Hx     Social History   Tobacco Use   Smoking status: Never   Smokeless tobacco: Never   Tobacco comments:    Second hand smoke exposure  Substance Use Topics   Alcohol use: Not Currently    Alcohol/week: 0.0 standard drinks     Current Outpatient Medications:    acetaminophen (TYLENOL) 650 MG CR tablet, Take 1,300 mg by mouth every 8 (eight) hours as needed for pain., Disp: , Rfl:    albuterol (VENTOLIN HFA) 108 (90 Base) MCG/ACT inhaler, Inhale 2 puffs into the lungs every 6 (six) hours as needed for wheezing or shortness of breath., Disp: 18 g, Rfl: 0   Ascorbic Acid (VITAMIN C) 1000 MG tablet, Take 1,000 mg by mouth daily., Disp: , Rfl:    atorvastatin (LIPITOR) 40 MG tablet, TAKE 1 TABLET BY MOUTH EVERY DAY, Disp: 90 tablet, Rfl: 0   azelastine (ASTELIN) 0.1 % nasal spray, 2 SPRAYS IN EACH NOSTRIL DAILY, Disp: 90 mL, Rfl: 0   budesonide (PULMICORT) 0.5 MG/2ML nebulizer solution, Take 2 mLs (0.5 mg total) by nebulization in the morning and at bedtime., Disp: 120 mL, Rfl: 1   calcium carbonate (TUMS - DOSED IN MG ELEMENTAL CALCIUM) 500 MG chewable tablet, Chew 500-1,000 mg by mouth daily as needed for indigestion or heartburn., Disp: , Rfl:    ferrous sulfate 325 (65 FE) MG tablet, Take 325 mg by mouth daily with breakfast., Disp: , Rfl:    fluticasone (FLONASE) 50 MCG/ACT nasal spray, SPRAY 2  SPRAYS INTO EACH NOSTRIL EVERY DAY, Disp: 48 mL, Rfl: 1   Fluticasone-Umeclidin-Vilant (TRELEGY ELLIPTA) 100-62.5-25 MCG/ACT AEPB, Inhale 1 puff into the lungs daily., Disp: 1 each, Rfl: 2   gabapentin (NEURONTIN) 100 MG capsule, Take 100 mg by mouth at bedtime as needed (pain)., Disp: , Rfl:    ipratropium-albuterol (DUONEB) 0.5-2.5 (3) MG/3ML SOLN, Take 3 mLs by nebulization every 6 (six) hours as needed., Disp: 360 mL, Rfl: 0   loratadine (CLARITIN) 10 MG tablet, Take 1 tablet (10 mg total) by mouth daily., Disp: 90 tablet, Rfl: 0   losartan (COZAAR) 25 MG tablet, TAKE 1 TABLET (25 MG TOTAL) BY MOUTH DAILY., Disp: 90 tablet, Rfl: 0   magnesium oxide (MAG-OX) 400 MG tablet, Take 400 mg by mouth daily., Disp: , Rfl:    metaxalone (SKELAXIN) 800  MG tablet, Take 1 tablet (800 mg total) by mouth 3 (three) times daily as needed for muscle spasms., Disp: 90 tablet, Rfl: 0   montelukast (SINGULAIR) 10 MG tablet, TAKE 1 TABLET BY MOUTH EVERYDAY AT BEDTIME, Disp: 90 tablet, Rfl: 1   Multiple Vitamins-Minerals (CENTRUM SILVER) tablet, Take 1 tablet by mouth daily., Disp: 30 tablet, Rfl: 0   oxyCODONE (OXY IR/ROXICODONE) 5 MG immediate release tablet, Take 1 tablet (5 mg total) by mouth every 4 (four) hours as needed for moderate pain (pain score 4-6)., Disp: 30 tablet, Rfl: 0   predniSONE (DELTASONE) 10 MG tablet, Prednisone taper; 10 mg tablets: 4 tabs x 2 days, 3 tabs x 2 days, 2 tabs x 2 days 1 tab x 2 days then stop., Disp: 20 tablet, Rfl: 0   zinc gluconate 50 MG tablet, Take 50 mg by mouth daily., Disp: , Rfl:   Allergies  Allergen Reactions   Contrast Media [Iodinated Contrast Media]     Unknown reaction   Shellfish Allergy     Edema    I personally reviewed active problem list, medication list, allergies, family history, social history, health maintenance with the patient/caregiver today.   ROS  ***  Objective  There were no vitals filed for this visit.  There is no height or weight  on file to calculate BMI.  Physical Exam ***  Recent Results (from the past 2160 hour(s))  Angiotensin converting enzyme     Status: Abnormal   Collection Time: 09/10/21 12:32 PM  Result Value Ref Range   Angiotensin-Converting Enzyme 83 (H) 9 - 67 U/L  Nitric oxide     Status: Normal   Collection Time: 09/28/21 10:52 AM  Result Value Ref Range   FeNO level (ppb) 63     PHQ2/9: Depression screen [facility] 2/9 07/25/2021 04/10/2021 11/01/2020 10/12/2020 08/31/2020  Decreased Interest 0 0 0 0 0  Down, Depressed, Hopeless 0 0 0 0 0  PHQ - 2 Score 0 0 0 0 0  Altered sleeping 0 - - - 1  Tired, decreased energy 0 - - - 0  Change in appetite 0 - - - 0  Feeling bad or failure about yourself  0 - - - 0  Trouble concentrating 0 - - - 0  Moving slowly or fidgety/restless 0 - - - 0  Suicidal thoughts 0 - - - 0  PHQ-9 Score 0 - - - 1  Difficult doing work/chores - - - - Not difficult at all  Some recent data might be hidden    phq 9 is {gen pos [id number]}   Fall Risk: Fall Risk  07/25/2021 04/10/2021 11/01/2020 10/12/2020 08/31/2020  Falls in the past year? 0 0 1 1 0  Comment - - - - -  Number falls in past yr: 0 0 0 0 0  Injury with Fall? 0 0 0 0 0  Comment - - - - -  Risk for fall due to : No Fall Risks No Fall Risks History of fall(s) No Fall Risks -  Follow up Falls prevention discussed Falls prevention discussed Falls prevention discussed Falls prevention discussed -      Functional Status Survey:      Assessment & Plan  *** There are no diagnoses linked to this encounter.

## 2021-10-08 ENCOUNTER — Ambulatory Visit: Payer: Medicare Other | Admitting: Family Medicine

## 2021-10-08 DIAGNOSIS — Z23 Encounter for immunization: Secondary | ICD-10-CM

## 2021-10-10 DIAGNOSIS — F411 Generalized anxiety disorder: Secondary | ICD-10-CM | POA: Diagnosis not present

## 2021-10-12 ENCOUNTER — Ambulatory Visit: Payer: Medicare Other | Admitting: Family Medicine

## 2021-10-15 DIAGNOSIS — Z20822 Contact with and (suspected) exposure to covid-19: Secondary | ICD-10-CM | POA: Diagnosis not present

## 2021-10-16 DIAGNOSIS — I728 Aneurysm of other specified arteries: Secondary | ICD-10-CM | POA: Diagnosis not present

## 2021-10-16 DIAGNOSIS — Z96651 Presence of right artificial knee joint: Secondary | ICD-10-CM | POA: Diagnosis not present

## 2021-10-17 ENCOUNTER — Other Ambulatory Visit: Payer: Self-pay | Admitting: Acute Care

## 2021-10-17 DIAGNOSIS — J4521 Mild intermittent asthma with (acute) exacerbation: Secondary | ICD-10-CM

## 2021-10-18 ENCOUNTER — Ambulatory Visit: Payer: Medicare Other

## 2021-10-22 ENCOUNTER — Other Ambulatory Visit: Payer: Self-pay | Admitting: Family Medicine

## 2021-10-22 DIAGNOSIS — Z8739 Personal history of other diseases of the musculoskeletal system and connective tissue: Secondary | ICD-10-CM | POA: Diagnosis not present

## 2021-10-22 DIAGNOSIS — G8929 Other chronic pain: Secondary | ICD-10-CM | POA: Diagnosis not present

## 2021-10-22 DIAGNOSIS — M5412 Radiculopathy, cervical region: Secondary | ICD-10-CM | POA: Diagnosis not present

## 2021-10-22 DIAGNOSIS — I10 Essential (primary) hypertension: Secondary | ICD-10-CM

## 2021-10-22 DIAGNOSIS — M25511 Pain in right shoulder: Secondary | ICD-10-CM | POA: Diagnosis not present

## 2021-10-22 DIAGNOSIS — M5030 Other cervical disc degeneration, unspecified cervical region: Secondary | ICD-10-CM | POA: Diagnosis not present

## 2021-10-22 DIAGNOSIS — M50122 Cervical disc disorder at C5-C6 level with radiculopathy: Secondary | ICD-10-CM | POA: Diagnosis not present

## 2021-10-23 ENCOUNTER — Encounter: Payer: Self-pay | Admitting: Family Medicine

## 2021-10-23 ENCOUNTER — Ambulatory Visit (INDEPENDENT_AMBULATORY_CARE_PROVIDER_SITE_OTHER): Payer: Medicare Other

## 2021-10-23 ENCOUNTER — Ambulatory Visit: Payer: Medicare Other | Admitting: Acute Care

## 2021-10-23 VITALS — BP 132/80 | HR 67 | Temp 98.3°F | Resp 16 | Ht 71.0 in | Wt 204.5 lb

## 2021-10-23 DIAGNOSIS — Z0000 Encounter for general adult medical examination without abnormal findings: Secondary | ICD-10-CM

## 2021-10-23 NOTE — Progress Notes (Signed)
Subjective:   Melissa [name] is a 71 y.o. female who presents for Medicare Annual (Subsequent) preventive examination.  Review of Systems     Cardiac Risk Factors include: advanced age (>28mn, >>44women);dyslipidemia;hypertension     Objective:    Today's Vitals   10/23/21 1338 10/23/21 1341  BP: 132/80   Pulse: 67   Resp: 16   Temp: 98.3 F (36.8 C)   TempSrc: Oral   Weight: 204 lb 8 oz (92.8 kg)   Height: '5\' 11"'$  (1.803 m)   PainSc:  3    Body mass index is 28.52 kg/m.  Advanced Directives 10/23/2021 10/29/2020 10/18/2020 10/12/2020 10/10/2020 10/08/2019 05/04/2019  Does Patient Have a Medical Advance Directive? Yes No No No No No No  Type of AParamedicof [place] will - - - - - -  Copy of HGlenrockin Chart? No - copy requested - - - - - -  Would patient like information on creating a medical advance directive? - No - Patient declined No - Patient declined Yes (MAU/Ambulatory/Procedural Areas - Information given) - Yes (MAU/Ambulatory/Procedural Areas - Information given) -    Current Medications (verified) Outpatient Encounter Medications as of 10/23/2021  Medication Sig   acetaminophen (TYLENOL) 650 MG CR tablet Take 1,300 mg by mouth every 8 (eight) hours as needed for pain.   albuterol (VENTOLIN HFA) 108 (90 Base) MCG/ACT inhaler TAKE 2 PUFFS BY MOUTH EVERY 6 HOURS AS NEEDED FOR WHEEZE OR SHORTNESS OF BREATH   Ascorbic Acid (VITAMIN C) 1000 MG tablet Take 1,000 mg by mouth daily.   atorvastatin (LIPITOR) 40 MG tablet TAKE 1 TABLET BY MOUTH EVERY DAY   azelastine (ASTELIN) 0.1 % nasal spray 2 SPRAYS IN EACH NOSTRIL DAILY   budesonide (PULMICORT) 0.5 MG/2ML nebulizer solution Take 2 mLs (0.5 mg total) by nebulization in the morning and at bedtime.   Calcium Carbonate (CALCIUM 600 PO) Take by mouth.   calcium carbonate (TUMS - DOSED IN MG ELEMENTAL CALCIUM) 500 MG chewable tablet Chew 500-1,000 mg by mouth daily as needed for  indigestion or heartburn.   ferrous sulfate 325 (65 FE) MG tablet Take 325 mg by mouth daily with breakfast.   fluticasone (FLONASE) 50 MCG/ACT nasal spray SPRAY 2 SPRAYS INTO EACH NOSTRIL EVERY DAY   Fluticasone-Umeclidin-Vilant (TRELEGY ELLIPTA) 100-62.5-25 MCG/ACT AEPB Inhale 1 puff into the lungs daily.   gabapentin (NEURONTIN) 100 MG capsule Take 100 mg by mouth at bedtime as needed (pain).   ipratropium-albuterol (DUONEB) 0.5-2.5 (3) MG/3ML SOLN Take 3 mLs by nebulization every 6 (six) hours as needed.   loratadine (CLARITIN) 10 MG tablet Take 1 tablet (10 mg total) by mouth daily.   losartan (COZAAR) 25 MG tablet TAKE 1 TABLET (25 MG TOTAL) BY MOUTH DAILY.   magnesium oxide (MAG-OX) 400 MG tablet Take 400 mg by mouth daily.   metaxalone (SKELAXIN) 800 MG tablet Take 1 tablet (800 mg total) by mouth 3 (three) times daily as needed for muscle spasms.   montelukast (SINGULAIR) 10 MG tablet TAKE 1 TABLET BY MOUTH EVERYDAY AT BEDTIME   Multiple Vitamins-Minerals (CENTRUM SILVER) tablet Take 1 tablet by mouth daily.   oxyCODONE (OXY IR/ROXICODONE) 5 MG immediate release tablet Take 1 tablet (5 mg total) by mouth every 4 (four) hours as needed for moderate pain (pain score 4-6).   zinc gluconate 50 MG tablet Take 50 mg by mouth daily.   [DISCONTINUED] predniSONE (DELTASONE) 10 MG tablet Prednisone taper; 10  mg tablets: 4 tabs x 2 days, 3 tabs x 2 days, 2 tabs x 2 days 1 tab x 2 days then stop.   No facility-administered encounter medications on file as of 10/23/2021.    Allergies (verified) Contrast media [iodinated contrast media] and Shellfish allergy   History: Past Medical History:  Diagnosis Date   Aortic atherosclerosis ([place])    Bruising    Cardiomyopathy ([place])    Celiac artery aneurysm (HCC)    Cervical radiculopathy    CKD (chronic kidney disease), stage III (HCC)    Deficiency of vitamin B    Depression    Edema    Flank pain    Gross hematuria    History of 2019 novel  coronavirus disease (COVID-19) 05/01/2019   History of bariatric surgery    HLD (hyperlipidemia)    HTN (hypertension)    Interstitial lung disease (HCC)    Iron deficiency anemia    Leukopenia    OA (osteoarthritis)    Obesity    OSA on CPAP    Osteopenia    Perennial allergic rhinitis    Pneumonia due to COVID-19 virus 2020   Renal cysts, acquired, bilateral    RLS (restless legs syndrome)    Sarcoidosis    Sickle cell trait (HCC)    Snoring    Spinal stenosis    Trigeminal neuralgia    Past Surgical History:  Procedure Laterality Date   BARIATRIC SURGERY     CESAREAN SECTION     3 or more   COLONOSCOPY WITH PROPOFOL N/A 03/15/2019   Procedure: COLONOSCOPY WITH PROPOFOL;  Surgeon: [name], MD;  Location: [facility] ENDOSCOPY;  Service: Gastroenterology;  Laterality: N/A;   GASTRIC BYPASS     KNEE ARTHROPLASTY Right 10/18/2020   Procedure: COMPUTER ASSISTED TOTAL KNEE ARTHROPLASTY;  Surgeon: Dereck Leep, MD;  Location: [facility] ORS;  Service: Orthopedics;  Laterality: Right;   SHOULDER ARTHROSCOPY Right    TUBAL LIGATION     Family History  Problem Relation Age of Onset   Hypercholesterolemia Mother    Heart disease Mother    Hypertension Mother    Alcohol abuse Father    Lung cancer Brother    Alcohol abuse Brother    Diabetes Mellitus II Sister    Hypertension Maternal Grandmother    Colon cancer Sister    Breast cancer Neg Hx    Prostate cancer Neg Hx    Bladder Cancer Neg Hx    Kidney cancer Neg Hx    Social History   Socioeconomic History   Marital status: Widowed    Spouse name: Lexandra Rettke   Number of children: 4   Years of education: Not on file   Highest education level: Some college, no degree  Occupational History    Comment: retired  Tobacco Use   Smoking status: Never   Smokeless tobacco: Never   Tobacco comments:    Second hand smoke exposure  Vaping Use   Vaping Use: Never used  Substance and Sexual Activity   Alcohol use: Not  Currently    Alcohol/week: 0.0 standard drinks   Drug use: No   Sexual activity: Yes    Partners: Male    Birth control/protection: Post-menopausal  Other Topics Concern   Not on file  Social History Narrative   Pt's son lives with her   Social Determinants of Health   Financial Resource Strain: Low Risk    Difficulty of Paying Living Expenses: Not hard at all  Food Insecurity: No Food Insecurity   Worried About Charity fundraiser in the Last Year: Never true   Ran Out of Food in the Last Year: Never true  Transportation Needs: No Transportation Needs   Lack of Transportation (Medical): No   Lack of Transportation (Non-Medical): No  Physical Activity: Sufficiently Active   Days of Exercise per Week: 4 days   Minutes of Exercise per Session: 90 min  Stress: No Stress Concern Present   Feeling of Stress : Not at all  Social Connections: Socially Isolated   Frequency of Communication with Friends and Family: More than three times a week   Frequency of Social Gatherings with Friends and Family: More than three times a week   Attends Religious Services: Never   Marine scientist or Organizations: No   Attends Archivist Meetings: Never   Marital Status: Widowed    Tobacco Counseling Counseling given: Not Answered Tobacco comments: Second hand smoke exposure   Clinical Intake:  Pre-visit preparation completed: Yes  Pain : 0-10 Pain Score: 3  Pain Type: Chronic pain Pain Location: Back Pain Orientation: Lower Pain Descriptors / Indicators: Aching, Sore Pain Onset: More than a month ago Pain Frequency: Constant     BMI - recorded: 28.52 Nutritional Status: BMI 25 -29 Overweight Nutritional Risks: None Diabetes: No  How often do you need to have someone help you when you read instructions, pamphlets, or other written materials from your doctor or pharmacy?: 1 - Never    Interpreter Needed?: No  Information entered by :: Clemetine Marker  LPN   Activities of Daily Living In your present state of health, do you have any difficulty performing the following activities: 10/23/2021 07/25/2021  Hearing? Y N  Vision? N N  Difficulty concentrating or making decisions? N N  Walking or climbing stairs? N N  Dressing or bathing? N N  Doing errands, shopping? N N  Preparing Food and eating ? N -  Using the Toilet? N -  In the past six months, have you accidently leaked urine? N -  Do you have problems with loss of bowel control? N -  Managing your Medications? N -  Managing your Finances? N -  Housekeeping or managing your Housekeeping? N -  Some recent data might be hidden    Patient Care Team: Steele Sizer, MD as PCP - General (Family Medicine) Lucilla Lame, MD as Consulting Physician (Gastroenterology) Sharlet Salina, MD as Referring Physician (Physical Medicine and Rehabilitation) Rockey Situ, Kathlene November, MD as Consulting Physician (Cardiology) Hooten, Laurice Record, MD (Orthopedic Surgery) Magdalen Spatz, NP as Nurse Practitioner (Pulmonary Disease) Lavonia Dana, MD as Consulting Physician (Nephrology) Laverda Page, MD as Referring Physician (Pulmonary Disease)  Indicate any recent Medical Services you may have received from other than [facility] providers in the past year (date may be approximate).     Assessment:   This is a routine wellness examination for Melissa [name].  Hearing/Vision screen Hearing Screening - Comments:: Pt c/o mild hearing difficulty due to possible wax build up Vision Screening - Comments:: [facility] screenings with [facility]  Dietary issues and exercise activities discussed: Current Exercise Habits: Structured exercise class, Type of exercise: strength training/weights;stretching;Other - see comments (water aerobics), Time (Minutes): > 60, Frequency (Times/Week): 4, Weekly Exercise (Minutes/Week): 0, Intensity: Moderate, Exercise limited by: None identified   Goals Addressed              This Visit's Progress    Patient  Stated   On track    Patient states she is looking forward to planting a vegetable garden this year       Depression Screen PHQ 2/9 Scores 10/23/2021 07/25/2021 04/10/2021 11/01/2020 10/12/2020 08/31/2020 07/18/2020  PHQ - 2 Score 0 0 0 0 0 0 0  PHQ- 9 Score 3 0 - - - 1 -    Fall Risk Fall Risk  10/23/2021 07/25/2021 04/10/2021 11/01/2020 10/12/2020  Falls in the past year? 0 0 0 1 1  Comment - - - - -  Number falls in past yr: 0 0 0 0 0  Injury with Fall? 0 0 0 0 0  Comment - - - - -  Risk for fall due to : No Fall Risks No Fall Risks No Fall Risks History of fall(s) No Fall Risks  Follow up Falls prevention discussed Falls prevention discussed Falls prevention discussed Falls prevention discussed Falls prevention discussed    FALL RISK PREVENTION PERTAINING TO THE HOME:  Any stairs in or around the home? Yes  If so, are there any without handrails? No  Home free of loose throw rugs in walkways, pet beds, electrical cords, etc? Yes  Adequate lighting in your home to reduce risk of falls? Yes   ASSISTIVE DEVICES UTILIZED TO PREVENT FALLS:  Life alert? No  Use of a cane, walker or w/c? No  Grab bars in the bathroom? No  Shower chair or bench in shower? Yes  Elevated toilet seat or a handicapped toilet? No   TIMED UP AND GO:  Was the test performed? Yes .  Length of time to ambulate 10 feet: 5 sec.   Gait steady and fast without use of assistive device  Cognitive Function:        Immunizations Immunization History  Administered Date(s) Administered   Fluad Quad(high Dose 65+) 07/01/2019, 07/20/2020   Influenza, High Dose Seasonal PF 07/02/2018, 06/11/2021   Influenza,inj,Quad PF,6+ Mos 06/13/2015, 08/21/2016, 05/07/2017   PFIZER Comirnaty(Gray Top)Covid-19 Tri-Sucrose Vaccine 12/20/2020   PFIZER(Purple Top)SARS-COV-2 Vaccination 10/02/2019, 10/23/2019, 05/12/2020   Pneumococcal Conjugate-13 02/24/2018   Pneumococcal Polysaccharide-23  01/26/2020   Tdap 04/29/2011   Zoster Recombinat (Shingrix) 02/24/2018, 10/29/2018   Zoster, Live 08/21/2016    TDAP status: Due, Education has been provided regarding the importance of this vaccine. Advised may receive this vaccine at local pharmacy or Health Dept. Aware to provide a copy of the vaccination record if obtained from local pharmacy or Health Dept. Verbalized acceptance and understanding.  Flu Vaccine status: Up to date  Pneumococcal vaccine status: Up to date  Covid-19 vaccine status: Completed vaccines  Qualifies for Shingles Vaccine? Yes   Zostavax completed Yes   Shingrix Completed?: Yes  Screening Tests Health Maintenance  Topic Date Due   COVID-19 Vaccine (5 - Booster for Pfizer series) 02/14/2021   TETANUS/TDAP  04/28/2021   COLONOSCOPY (Pts 45-9yr Insurance coverage will need to be confirmed)  03/14/2022   MAMMOGRAM  10/02/2023   Pneumonia Vaccine 71 Years old  Completed   INFLUENZA VACCINE  Completed   DEXA SCAN  Completed   Hepatitis C Screening  Completed   Zoster Vaccines- Shingrix  Completed   HPV VACCINES  Aged Out    Health Maintenance  Health Maintenance Due  Topic Date Due   COVID-19 Vaccine (5 - Booster for [place]) 02/14/2021   TETANUS/TDAP  04/28/2021    Colorectal cancer screening: Type of screening: Colonoscopy. Completed 03/15/19. Repeat every 3 years  Mammogram status: Completed 10/01/21. Repeat  every year  Bone Density status: Completed 09/08/18. Results reflect: Bone density results: NORMAL. Repeat every 2 years.Marland Kitchen ordered today.   Lung Cancer Screening: (Low Dose CT Chest recommended if Age 79-80 years, 30 pack-year currently smoking OR have quit w/in 15years.) does not qualify.   Additional Screening:  Hepatitis C Screening: does qualify; Completed 11/13/12  Vision Screening: Recommended annual ophthalmology exams for early detection of glaucoma and other disorders of the eye. Is the patient up to date with their  annual eye exam?  Yes  Who is the provider or what is the name of the office in which the patient attends annual eye exams? [facility].   Dental Screening: Recommended annual dental exams for proper oral hygiene  Community Resource Referral / Chronic Care Management: CRR required this visit?  No   CCM required this visit?  No      Plan:     I have personally reviewed and noted the following in the patients chart:   Medical and social history Use of alcohol, tobacco or illicit drugs  Current medications and supplements including opioid prescriptions.  Functional ability and status Nutritional status Physical activity Advanced directives List of other physicians Hospitalizations, surgeries, and ER visits in previous 12 months Vitals Screenings to include cognitive, depression, and falls Referrals and appointments  In addition, I have reviewed and discussed with patient certain preventive protocols, quality metrics, and best practice recommendations. A written personalized care plan for preventive services as well as general preventive health recommendations were provided to patient.     Clemetine Marker, LPN   8/0/0349   Nurse Notes: none

## 2021-10-23 NOTE — Patient Instructions (Signed)
[name] , Thank you for taking time to come for your Medicare Wellness Visit. I appreciate your ongoing commitment to your health goals. Please review the following plan we discussed and let me know if I can assist you in the future.   Screening recommendations/referrals: Colonoscopy: done 03/15/19; due 02/2022 Mammogram: done 10/01/21 Bone Density: done 09/08/18. Please call [phone] to schedule your bone density screening.  Recommended yearly ophthalmology/optometry visit for glaucoma screening and checkup Recommended yearly dental visit for hygiene and checkup  Vaccinations: Influenza vaccine: done 06/11/21 Pneumococcal vaccine: done 01/26/20 Tdap vaccine: due Shingles vaccine: done 02/24/18 & 10/29/18   Covid-19:done 10/02/19, 10/23/19, 05/12/20 & 12/20/20  Advanced directives: Please bring a copy of your health care power of attorney and living will to the office at your convenience.   Conditions/risks identified: Keep up the great work!  Next appointment: Follow up in one year for your annual wellness visit    Preventive Care 65 Years and Older, Female Preventive care refers to lifestyle choices and visits with your health care provider that can promote health and wellness. What does preventive care include? A yearly physical exam. This is also called an annual well check. Dental exams once or twice a year. Routine eye exams. Ask your health care provider how often you should have your eyes checked. Personal lifestyle choices, including: Daily care of your teeth and gums. Regular physical activity. Eating a healthy diet. Avoiding tobacco and drug use. Limiting alcohol use. Practicing safe sex. Taking low-dose aspirin every day. Taking vitamin and mineral supplements as recommended by your health care provider. What happens during an annual well check? The services and screenings done by your health care provider during your annual well check will depend on your age, overall  health, lifestyle risk factors, and family history of disease. Counseling  Your health care provider may ask you questions about your: Alcohol use. Tobacco use. Drug use. Emotional well-being. Home and relationship well-being. Sexual activity. Eating habits. History of falls. Memory and ability to understand (cognition). Work and work Statistician. Reproductive health. Screening  You may have the following tests or measurements: Height, weight, and BMI. Blood pressure. Lipid and cholesterol levels. These may be checked every 5 years, or more frequently if you are over 52 years old. Skin check. Lung cancer screening. You may have this screening every year starting at age 34 if you have a 30-pack-year history of smoking and currently smoke or have quit within the past 15 years. Fecal occult blood test (FOBT) of the stool. You may have this test every year starting at age 42. Flexible sigmoidoscopy or colonoscopy. You may have a sigmoidoscopy every 5 years or a colonoscopy every 10 years starting at age 19. Hepatitis C blood test. Hepatitis B blood test. Sexually transmitted disease (STD) testing. Diabetes screening. This is done by checking your blood sugar (glucose) after you have not eaten for a while (fasting). You may have this done every 1-3 years. Bone density scan. This is done to screen for osteoporosis. You may have this done starting at age 68. Mammogram. This may be done every 1-2 years. Talk to your health care provider about how often you should have regular mammograms. Talk with your health care provider about your test results, treatment options, and if necessary, the need for more tests. Vaccines  Your health care provider may recommend certain vaccines, such as: Influenza vaccine. This is recommended every year. Tetanus, diphtheria, and acellular pertussis (Tdap, Td) vaccine. You may need a  Td booster every 10 years. Zoster vaccine. You may need this after age  96. Pneumococcal 13-valent conjugate (PCV13) vaccine. One dose is recommended after age 66. Pneumococcal polysaccharide (PPSV23) vaccine. One dose is recommended after age 69. Talk to your health care provider about which screenings and vaccines you need and how often you need them. This information is not intended to replace advice given to you by your health care provider. Make sure you discuss any questions you have with your health care provider. Document Released: 09/01/2015 Document Revised: 04/24/2016 Document Reviewed: 06/06/2015 Elsevier Interactive Patient Education  2017 [employer] Prevention in the Home Falls can cause injuries. They can happen to people of all ages. There are many things you can do to make your home safe and to help prevent falls. What can I do on the outside of my home? Regularly fix the edges of walkways and driveways and fix any cracks. Remove anything that might make you trip as you walk through a door, such as a raised step or threshold. Trim any bushes or trees on the path to your home. Use bright outdoor lighting. Clear any walking paths of anything that might make someone trip, such as rocks or tools. Regularly check to see if handrails are loose or broken. Make sure that both sides of any steps have handrails. Any raised decks and porches should have guardrails on the edges. Have any leaves, snow, or ice cleared regularly. Use sand or salt on walking paths during winter. Clean up any spills in your garage right away. This includes oil or grease spills. What can I do in the bathroom? Use night lights. Install grab bars by the toilet and in the tub and shower. Do not use towel bars as grab bars. Use non-skid mats or decals in the tub or shower. If you need to sit down in the shower, use a plastic, non-slip stool. Keep the floor dry. Clean up any water that spills on the floor as soon as it happens. Remove soap buildup in the tub or shower  regularly. Attach bath mats securely with double-sided non-slip rug tape. Do not have throw rugs and other things on the floor that can make you trip. What can I do in the bedroom? Use night lights. Make sure that you have a light by your bed that is easy to reach. Do not use any sheets or blankets that are too big for your bed. They should not hang down onto the floor. Have a firm chair that has side arms. You can use this for support while you get dressed. Do not have throw rugs and other things on the floor that can make you trip. What can I do in the kitchen? Clean up any spills right away. Avoid walking on wet floors. Keep items that you use a lot in easy-to-reach places. If you need to reach something above you, use a strong step stool that has a grab bar. Keep electrical cords out of the way. Do not use floor polish or wax that makes floors slippery. If you must use wax, use non-skid floor wax. Do not have throw rugs and other things on the floor that can make you trip. What can I do with my stairs? Do not leave any items on the stairs. Make sure that there are handrails on both sides of the stairs and use them. Fix handrails that are broken or loose. Make sure that handrails are as long as the stairways. Check any  carpeting to make sure that it is firmly attached to the stairs. Fix any carpet that is loose or worn. Avoid having throw rugs at the top or bottom of the stairs. If you do have throw rugs, attach them to the floor with carpet tape. Make sure that you have a light switch at the top of the stairs and the bottom of the stairs. If you do not have them, ask someone to add them for you. What else can I do to help prevent falls? Wear shoes that: Do not have high heels. Have rubber bottoms. Are comfortable and fit you well. Are closed at the toe. Do not wear sandals. If you use a stepladder: Make sure that it is fully opened. Do not climb a closed stepladder. Make sure that  both sides of the stepladder are locked into place. Ask someone to hold it for you, if possible. Clearly mark and make sure that you can see: Any grab bars or handrails. First and last steps. Where the edge of each step is. Use tools that help you move around (mobility aids) if they are needed. These include: Canes. Walkers. Scooters. Crutches. Turn on the lights when you go into a dark area. Replace any light bulbs as soon as they burn out. Set up your furniture so you have a clear path. Avoid moving your furniture around. If any of your floors are uneven, fix them. If there are any pets around you, be aware of where they are. Review your medicines with your doctor. Some medicines can make you feel dizzy. This can increase your chance of falling. Ask your doctor what other things that you can do to help prevent falls. This information is not intended to replace advice given to you by your health care provider. Make sure you discuss any questions you have with your health care provider. Document Released: 06/01/2009 Document Revised: 01/11/2016 Document Reviewed: 09/09/2014 Elsevier Interactive Patient Education  2017 [employer].

## 2021-10-24 DIAGNOSIS — F411 Generalized anxiety disorder: Secondary | ICD-10-CM | POA: Diagnosis not present

## 2021-10-31 ENCOUNTER — Ambulatory Visit: Payer: Medicare Other | Admitting: Family Medicine

## 2021-10-31 DIAGNOSIS — Z20822 Contact with and (suspected) exposure to covid-19: Secondary | ICD-10-CM | POA: Diagnosis not present

## 2021-11-07 DIAGNOSIS — M25511 Pain in right shoulder: Secondary | ICD-10-CM | POA: Diagnosis not present

## 2021-11-07 DIAGNOSIS — F411 Generalized anxiety disorder: Secondary | ICD-10-CM | POA: Diagnosis not present

## 2021-11-07 DIAGNOSIS — G8929 Other chronic pain: Secondary | ICD-10-CM | POA: Diagnosis not present

## 2021-11-09 DIAGNOSIS — Z20822 Contact with and (suspected) exposure to covid-19: Secondary | ICD-10-CM | POA: Diagnosis not present

## 2021-11-10 DIAGNOSIS — Z20822 Contact with and (suspected) exposure to covid-19: Secondary | ICD-10-CM | POA: Diagnosis not present

## 2021-11-16 DIAGNOSIS — Z20822 Contact with and (suspected) exposure to covid-19: Secondary | ICD-10-CM | POA: Diagnosis not present

## 2021-11-17 DIAGNOSIS — Z20822 Contact with and (suspected) exposure to covid-19: Secondary | ICD-10-CM | POA: Diagnosis not present

## 2021-11-20 ENCOUNTER — Ambulatory Visit: Payer: Medicare Other | Admitting: Pulmonary Disease

## 2021-11-20 ENCOUNTER — Ambulatory Visit: Payer: Medicare Other | Admitting: Acute Care

## 2021-11-21 DIAGNOSIS — F411 Generalized anxiety disorder: Secondary | ICD-10-CM | POA: Diagnosis not present

## 2021-11-23 DIAGNOSIS — Z20828 Contact with and (suspected) exposure to other viral communicable diseases: Secondary | ICD-10-CM | POA: Diagnosis not present

## 2021-11-26 ENCOUNTER — Other Ambulatory Visit: Payer: Self-pay | Admitting: Family Medicine

## 2021-11-26 DIAGNOSIS — J4521 Mild intermittent asthma with (acute) exacerbation: Secondary | ICD-10-CM

## 2021-11-26 DIAGNOSIS — G8929 Other chronic pain: Secondary | ICD-10-CM | POA: Diagnosis not present

## 2021-11-26 DIAGNOSIS — M25511 Pain in right shoulder: Secondary | ICD-10-CM | POA: Diagnosis not present

## 2021-11-28 DIAGNOSIS — R051 Acute cough: Secondary | ICD-10-CM | POA: Diagnosis not present

## 2021-11-28 DIAGNOSIS — Z20822 Contact with and (suspected) exposure to covid-19: Secondary | ICD-10-CM | POA: Diagnosis not present

## 2021-11-28 DIAGNOSIS — R059 Cough, unspecified: Secondary | ICD-10-CM | POA: Diagnosis not present

## 2021-11-28 DIAGNOSIS — G8929 Other chronic pain: Secondary | ICD-10-CM | POA: Diagnosis not present

## 2021-11-28 DIAGNOSIS — M25511 Pain in right shoulder: Secondary | ICD-10-CM | POA: Diagnosis not present

## 2021-12-03 DIAGNOSIS — G8929 Other chronic pain: Secondary | ICD-10-CM | POA: Diagnosis not present

## 2021-12-03 DIAGNOSIS — M25511 Pain in right shoulder: Secondary | ICD-10-CM | POA: Diagnosis not present

## 2021-12-03 DIAGNOSIS — Z20822 Contact with and (suspected) exposure to covid-19: Secondary | ICD-10-CM | POA: Diagnosis not present

## 2021-12-04 ENCOUNTER — Ambulatory Visit: Payer: Medicare Other | Admitting: Internal Medicine

## 2021-12-04 NOTE — Progress Notes (Deleted)
   Acute Office Visit  Subjective:     Patient ID: Melissa [name], female    DOB: [birth year], 71 y.o.   MRN: [id number]  No chief complaint on file.   HPI Patient is in today for asthma flare.  Asthma:  -Asthma status: {Blank single:19197::"controlled","uncontrolled","better","worse","exacerbated","stable"} -Current Treatments: Trelegy, DuoNebs -Satisfied with current treatment?: {Blank single:19197::"yes","no"} -Albuterol/rescue inhaler frequency:  -Dyspnea frequency:  -Wheezing frequency: -Cough frequency:  -Nocturnal symptom frequency:  -Limitation of activity: {Blank single:19197::"yes","no"} -Current upper respiratory symptoms: {Blank single:19197::"yes","no"} -Triggers:  -Home peak flows: -Last Spirometry:  -Failed/intolerant to following asthma meds:  -Asthma meds in past:  -Aerochamber/spacer use: {Blank single:19197::"yes","no"} -Visits to ER or Urgent Care in past year: {Blank single:19197::"yes","no"} -Pneumovax:  Prevnar 23 in 2021 -Influenza: Up to Date   ROS      Objective:    There were no vitals taken for this visit. BP Readings from Last 3 Encounters:  10/23/21 132/80  09/28/21 136/82  09/10/21 130/64   Wt Readings from Last 3 Encounters:  10/23/21 204 lb 8 oz (92.8 kg)  09/28/21 206 lb 12.8 oz (93.8 kg)  09/10/21 208 lb 9.6 oz (94.6 kg)      Physical Exam  No results found for any visits on 12/04/21.      Assessment & Plan:   Problem List Items Addressed This Visit   None   No orders of the defined types were placed in this encounter.   No follow-ups on file.  Teodora Medici, DO

## 2021-12-07 ENCOUNTER — Ambulatory Visit (INDEPENDENT_AMBULATORY_CARE_PROVIDER_SITE_OTHER): Payer: Medicare Other | Admitting: Acute Care

## 2021-12-07 ENCOUNTER — Encounter: Payer: Self-pay | Admitting: Acute Care

## 2021-12-07 VITALS — BP 110/80 | HR 90 | Temp 97.9°F | Ht 72.0 in | Wt 202.4 lb

## 2021-12-07 DIAGNOSIS — J3489 Other specified disorders of nose and nasal sinuses: Secondary | ICD-10-CM

## 2021-12-07 DIAGNOSIS — J4521 Mild intermittent asthma with (acute) exacerbation: Secondary | ICD-10-CM | POA: Diagnosis not present

## 2021-12-07 DIAGNOSIS — J302 Other seasonal allergic rhinitis: Secondary | ICD-10-CM | POA: Diagnosis not present

## 2021-12-07 DIAGNOSIS — D869 Sarcoidosis, unspecified: Secondary | ICD-10-CM

## 2021-12-07 LAB — POCT EXHALED NITRIC OXIDE: FeNO level (ppb): 42

## 2021-12-07 MED ORDER — BUDESONIDE 0.5 MG/2ML IN SUSP: 0.5000 mg | Freq: Two times a day (BID) | RESPIRATORY_TRACT | 6 refills | Status: DC

## 2021-12-07 MED ORDER — AZELASTINE HCL 0.1 % NA SOLN: 2.0000 | Freq: Two times a day (BID) | NASAL | 12 refills | Status: DC

## 2021-12-07 MED ORDER — PREDNISONE 10 MG PO TABS: ORAL_TABLET | ORAL | 0 refills | Status: DC

## 2021-12-07 MED ORDER — IPRATROPIUM-ALBUTEROL 0.5-2.5 (3) MG/3ML IN SOLN: 3.0000 mL | Freq: Four times a day (QID) | RESPIRATORY_TRACT | 3 refills | Status: DC | PRN

## 2021-12-07 MED ORDER — IPRATROPIUM-ALBUTEROL 0.5-2.5 (3) MG/3ML IN SOLN
3.0000 mL | Freq: Four times a day (QID) | RESPIRATORY_TRACT | 3 refills | Status: DC | PRN
Start: 2021-12-07 — End: 2021-12-07

## 2021-12-07 NOTE — Progress Notes (Addendum)
? ?History of Present Illness ?Melissa [name] is a 71 y.o. female  never smoker with pulmonary Sarcoid biopsy diagnosed in past,  OSA on CPAP, history of asthma as a child. She had COVID 19  04/2019. She did not require hospitalization, but she lost her husband to the virus. She did experience  post COVID dyspnea . 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 She is followed by Dr. Mortimer Fries, and she is also followed by Laverda Page, MD , pulmonary specialties [facility] for her sarcoid.  ?  ? ? ?12/07/2021 ?Pt. Presents for follow up. She was seen in the office 09/28/2021 for a suspected Sarcoid Flare. FENO was 61 ppb at her last OV. She was treated with a prednisone taper and she is here to follow up. She did get better on the second round of Prednisone . Was doing well for about 3 weeks, then she developed a cough, post nasal drip and nasal congestion. She is having a hard time determining if this is an allergy issue vs her sarcoid. She is taking an over the counter non-sedating antihistamine. She states she does need to restart her Flonase. Cough is non-productive for the most part, she will occasionally have a deep cough that is productive for a small amount of thick, slightly yellow tinged secretions She has been out of her DuoNebs and her Pulmicort. She does note that she does better when she takes these. She does not have any wheezing. She is seeing her Sarcoid Specialist in [place] on 5 /10. HRCT results 09/2021 noted below. FENO has been improving slowly over time, see last 3 results below.  ? ?FENO is 42 ppb. On 12/07/2021 ?Feno  was  61 ppb on 09/28/2021  ?FENO was 91 PPB on 09/10/2021 ? ? ? ?Test Results: ?CPAP Down Load ? ? ? ? ? ? ? ? ?HRCT Chest 10/03/2021>> Ordered at [facility] by Pulmonology Specialist ?FINDINGS:  ?AIRWAYS, LUNGS, PLEURA: Clear central tracheobronchial tree.  No pleural effusion. Multifocal scattered peribronchial nodular consolidation bilaterally. Left apical alveolar airspace  consolidation with architectural distortion. Minimal peripheral predominant reticulation and traction bronchiectasis in middle lobe, lingula and bilateral lower lobes.Mild air trapping at the lung bases. No honeycombing  ? ?MEDIASTINUM: Normal heart size.  No pericardial effusion.  Mildly dilated 3.2 cm main pulmonary artery. Numerous enlarged and calcified mediastinal and bilateral hilar lymph nodes.  ? ?IMAGED ABDOMEN: Partially imaged lobulated multicystic kidneys containing nonobstructing nephrolithiasis. Sequela of Roux-en-Y gastric bypass. Colonic diverticulosis.   ? ?CXR 09/10/2021 ?Bilateral ill-defined nodular densities predominantly in the right ?upper lobe, likely chronic inflammatory process. No new ?consolidation. There is no pleural effusion pneumothorax. The ?cardiac silhouette is within normal limits. No acute osseous ?pathology. Degenerative changes of the spine. ? ? ?  Latest Ref Rng & Units 10/18/2020  ? 12:01 PM 10/10/2020  ?  1:59 PM 05/11/2020  ?  8:36 AM  ?CBC  ?WBC 4.0 - 10.5 K/uL  5.3   5.7    ?Hemoglobin 12.0 - 15.0 g/dL 11.6   12.2   11.8    ?Hematocrit 36.0 - 46.0 % 34.0   36.8   35.8    ?Platelets 150 - 400 K/uL  234   221    ? ? ? ?  Latest Ref Rng & Units 06/11/2021  ? 10:25 AM 04/10/2021  ? 11:05 AM 10/18/2020  ? 12:01 PM  ?BMP  ?Glucose 65 - 99 mg/dL 82   86   54    ?  BUN 7 - 25 mg/dL '20   31   23    '$ ?Creatinine 0.50 - 1.05 mg/dL 1.60   1.64   1.60    ?BUN/Creat Ratio 6 - 22 (calc) 13   19     ?Sodium 135 - 146 mmol/L 141   139   143    ?Potassium 3.5 - 5.3 mmol/L 4.8   4.5   3.5    ?Chloride 98 - 110 mmol/L 107   104   106    ?CO2 20 - 32 mmol/L 24   24     ?Calcium 8.6 - 10.4 mg/dL 9.6   9.7     ? ? ?BNP ?No results found for: BNP ? ?ProBNP ?No results found for: PROBNP ? ?PFT ?   ?Component Value Date/Time  ? FEV1PRE 3.54 04/07/2020 1148  ? FEV1POST 3.55 04/07/2020 1148  ? FVCPRE 4.71 04/07/2020 1148  ? FVCPOST 4.61 04/07/2020 1148  ? TLC 7.48 04/07/2020 1148  ? [place] 20.30 04/07/2020  1148  ? PREFEV1FVCRT 75 04/07/2020 1148  ? PSTFEV1FVCRT 77 04/07/2020 1148  ? ? ?No results found. ? ? ?Past medical hx ?Past Medical History:  ?Diagnosis Date  ? Aortic atherosclerosis ([place])   ? Bruising   ? Cardiomyopathy ([place])   ? 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 Cervical radiculopathy   ? CKD (chronic kidney disease), stage III ([place])   ? Deficiency of vitamin B   ? Depression   ? Edema   ? Flank pain   ? Gross hematuria   ? History of 2019 novel coronavirus disease (COVID-19) 05/01/2019  ? History of bariatric surgery   ? HLD (hyperlipidemia)   ? HTN (hypertension)   ? Interstitial lung disease ([place])   ? Iron deficiency anemia   ? Leukopenia   ? OA (osteoarthritis)   ? Obesity   ? OSA on CPAP   ? Osteopenia   ? Perennial allergic rhinitis   ? Pneumonia due to COVID-19 virus 2020  ? Renal cysts, acquired, bilateral   ? RLS (restless legs syndrome)   ? Sarcoidosis   ? Sickle cell trait ([place])   ? Snoring   ? Spinal stenosis   ? Trigeminal neuralgia   ?  ? ?Social History  ? ?Tobacco Use  ? Smoking status: Never  ? Smokeless tobacco: Never  ? Tobacco comments:  ?  Second hand smoke exposure  ?Vaping Use  ? Vaping Use: Never used  ?Substance Use Topics  ? Alcohol use: Not Currently  ?  Alcohol/week: 0.0 standard drinks  ? Drug use: No  ? ? ?Ms.Reino reports that she has never smoked. She has never used smokeless tobacco. She reports that she does not currently use alcohol. She reports that she does not use drugs. ? ?Tobacco Cessation: ?Never smoker, + Second hand smoke exposure ? ?Past surgical hx, Family hx, Social hx all reviewed. ? ?Current Outpatient Medications on File Prior to Visit  ?Medication Sig  ? acetaminophen (TYLENOL) 650 MG CR tablet Take 1,300 mg by mouth every 8 (eight) hours as needed for pain.  ? albuterol (VENTOLIN HFA) 108 (90 Base) MCG/ACT inhaler TAKE 2 PUFFS BY MOUTH EVERY 6 HOURS AS NEEDED FOR WHEEZE OR SHORTNESS OF BREATH  ? Ascorbic Acid (VITAMIN C) 1000 MG tablet Take 1,000 mg by mouth  daily.  ? atorvastatin (LIPITOR) 40 MG tablet TAKE 1 TABLET BY MOUTH EVERY DAY  ? budesonide (PULMICORT) 0.5 MG/2ML nebulizer solution Take 2 mLs (0.5 mg total)  by nebulization in the morning and at bedtime.  ? Calcium Carbonate (CALCIUM 600 PO) Take by mouth.  ? calcium carbonate (TUMS - DOSED IN MG ELEMENTAL CALCIUM) 500 MG chewable tablet Chew 500-1,000 mg by mouth daily as needed for indigestion or heartburn.  ? ferrous sulfate 325 (65 FE) MG tablet Take 325 mg by mouth daily with breakfast.  ? fluticasone (FLONASE) 50 MCG/ACT nasal spray SPRAY 2 SPRAYS INTO EACH NOSTRIL EVERY DAY  ? gabapentin (NEURONTIN) 100 MG capsule Take 100 mg by mouth at bedtime as needed (pain).  ? ipratropium-albuterol (DUONEB) 0.5-2.5 (3) MG/3ML SOLN Take 3 mLs by nebulization every 6 (six) hours as needed.  ? loratadine (CLARITIN) 10 MG tablet Take 1 tablet (10 mg total) by mouth daily.  ? losartan (COZAAR) 25 MG tablet TAKE 1 TABLET (25 MG TOTAL) BY MOUTH DAILY.  ? magnesium oxide (MAG-OX) 400 MG tablet Take 400 mg by mouth daily.  ? metaxalone (SKELAXIN) 800 MG tablet Take 1 tablet (800 mg total) by mouth 3 (three) times daily as needed for muscle spasms.  ? montelukast (SINGULAIR) 10 MG tablet TAKE 1 TABLET BY MOUTH EVERYDAY AT BEDTIME  ? Multiple Vitamins-Minerals (CENTRUM SILVER) tablet Take 1 tablet by mouth daily.  ? oxyCODONE (OXY IR/ROXICODONE) 5 MG immediate release tablet Take 1 tablet (5 mg total) by mouth every 4 (four) hours as needed for moderate pain (pain score 4-6).  ? TRELEGY ELLIPTA 100-62.5-25 MCG/ACT AEPB TAKE 1 PUFF BY MOUTH EVERY DAY  ? zinc gluconate 50 MG tablet Take 50 mg by mouth daily.  ? ?No current facility-administered medications on file prior to visit.  ?  ? ?Allergies  ?Allergen Reactions  ? Contrast Media [Iodinated Contrast Media]   ?  Unknown reaction  ? Shellfish Allergy   ?  Edema  ? ? ?Review Of Systems: ? ?Constitutional:   No  weight loss, night sweats,  Fevers, chills, fatigue, or   lassitude. ? ?HEENT:   No headaches,  Difficulty swallowing,  Tooth/dental problems, or  Sore throat,  ?              No sneezing, itching, ear ache, + nasal congestion, + post nasal drip,  ? ?CV:  No chest pain,  Orthopne

## 2021-12-07 NOTE — Patient Instructions (Addendum)
It is good to see you today.  ?FENO today is 42 PPB, which is slowly creeping down. ?We will send in a prednisone taper. ?Prednisone taper; 10 mg tablets: 4 tabs x 2 days, 3 tabs x 2 days, 2 tabs x 2 days 1 tab x 2 days then stop.  ?We will send in prescriptions for Duonebs , use every 6 hours as needed.  ?We will re-order the Pulmicort , use in the morning and in the evening.  ?I have sent in a prescription for Astelin, 2 sprays daily,  ?Please pick up Flonase at the drug store and [place] 2 sprays up each nostril daily ?Try Xyzol for post nasal drip and allergies.  ?Continue Trelegy daily, rinse mouth after use. ?If not better, we will consider CT Chest. ?Continue on CPAP at bedtime. You appear to be benefiting from the treatment  ?Goal is to wear for at least 6 hours each night for maximal clinical benefit. ?Continue to work on weight loss, as the link between excess weight  and sleep apnea is well established.   ?Remember to establish a good bedtime routine, and work on sleep hygiene.  ?Limit daytime naps , avoid stimulants such as caffeine and nicotine close to bedtime, exercise daily to promote sleep quality, avoid heavy , spicy, fried , or rich foods before bed. Ensure adequate exposure to natural light during the day,establish a relaxing bedtime routine with a pleasant sleep environment ( Bedroom between 60 and 67 degrees, turn off bright lights , TV or device screens screens , consider black out curtains or white noise machines) ?Do not drive if sleepy. ?Remember to clean mask, tubing, filter, and reservoir once weekly with soapy water.  ?Follow up in 1 month with Judson Roch NP. ?

## 2021-12-10 ENCOUNTER — Telehealth: Payer: Self-pay | Admitting: Acute Care

## 2021-12-10 DIAGNOSIS — G8929 Other chronic pain: Secondary | ICD-10-CM | POA: Diagnosis not present

## 2021-12-10 DIAGNOSIS — J42 Unspecified chronic bronchitis: Secondary | ICD-10-CM

## 2021-12-10 DIAGNOSIS — M25511 Pain in right shoulder: Secondary | ICD-10-CM | POA: Diagnosis not present

## 2021-12-10 DIAGNOSIS — D869 Sarcoidosis, unspecified: Secondary | ICD-10-CM

## 2021-12-10 NOTE — Telephone Encounter (Signed)
Spoke with the pt  ?She said that Judson Roch called her and left her a voicemail after her visit 12/07/21  ?She said the msg told her she may need a flutter valve bc her last ct scan showed "mucus" ?Sarah- I do not see this documented..please advise, thanks! ?

## 2021-12-10 NOTE — Telephone Encounter (Signed)
I called and spoke with the pt ?She will pick up flutter valve tomorrow  ?She is aware needs to have clinical show her how to use this and let her sign for it  ? ?

## 2021-12-11 DIAGNOSIS — Z20822 Contact with and (suspected) exposure to covid-19: Secondary | ICD-10-CM | POA: Diagnosis not present

## 2021-12-12 DIAGNOSIS — G8929 Other chronic pain: Secondary | ICD-10-CM | POA: Diagnosis not present

## 2021-12-12 DIAGNOSIS — M25511 Pain in right shoulder: Secondary | ICD-10-CM | POA: Diagnosis not present

## 2021-12-17 DIAGNOSIS — M25511 Pain in right shoulder: Secondary | ICD-10-CM | POA: Diagnosis not present

## 2021-12-17 DIAGNOSIS — G8929 Other chronic pain: Secondary | ICD-10-CM | POA: Diagnosis not present

## 2021-12-18 DIAGNOSIS — N390 Urinary tract infection, site not specified: Secondary | ICD-10-CM | POA: Diagnosis not present

## 2021-12-18 DIAGNOSIS — Z20822 Contact with and (suspected) exposure to covid-19: Secondary | ICD-10-CM | POA: Diagnosis not present

## 2021-12-18 DIAGNOSIS — I129 Hypertensive chronic kidney disease with stage 1 through stage 4 chronic kidney disease, or unspecified chronic kidney disease: Secondary | ICD-10-CM | POA: Diagnosis not present

## 2021-12-18 DIAGNOSIS — R319 Hematuria, unspecified: Secondary | ICD-10-CM | POA: Diagnosis not present

## 2021-12-18 DIAGNOSIS — D573 Sickle-cell trait: Secondary | ICD-10-CM | POA: Diagnosis not present

## 2021-12-18 DIAGNOSIS — N1832 Chronic kidney disease, stage 3b: Secondary | ICD-10-CM | POA: Diagnosis not present

## 2021-12-18 DIAGNOSIS — D631 Anemia in chronic kidney disease: Secondary | ICD-10-CM | POA: Diagnosis not present

## 2021-12-19 DIAGNOSIS — G8929 Other chronic pain: Secondary | ICD-10-CM | POA: Diagnosis not present

## 2021-12-19 DIAGNOSIS — M25511 Pain in right shoulder: Secondary | ICD-10-CM | POA: Diagnosis not present

## 2021-12-19 DIAGNOSIS — F411 Generalized anxiety disorder: Secondary | ICD-10-CM | POA: Diagnosis not present

## 2021-12-24 ENCOUNTER — Other Ambulatory Visit: Payer: Self-pay | Admitting: Family Medicine

## 2021-12-24 DIAGNOSIS — Z20822 Contact with and (suspected) exposure to covid-19: Secondary | ICD-10-CM | POA: Diagnosis not present

## 2021-12-24 DIAGNOSIS — J849 Interstitial pulmonary disease, unspecified: Secondary | ICD-10-CM | POA: Diagnosis not present

## 2021-12-24 DIAGNOSIS — I700 Atherosclerosis of aorta: Secondary | ICD-10-CM

## 2021-12-25 DIAGNOSIS — N189 Chronic kidney disease, unspecified: Secondary | ICD-10-CM | POA: Diagnosis not present

## 2021-12-25 DIAGNOSIS — D860 Sarcoidosis of lung: Secondary | ICD-10-CM | POA: Diagnosis not present

## 2021-12-25 DIAGNOSIS — E785 Hyperlipidemia, unspecified: Secondary | ICD-10-CM | POA: Diagnosis not present

## 2021-12-25 DIAGNOSIS — I129 Hypertensive chronic kidney disease with stage 1 through stage 4 chronic kidney disease, or unspecified chronic kidney disease: Secondary | ICD-10-CM | POA: Diagnosis not present

## 2021-12-25 DIAGNOSIS — Z7951 Long term (current) use of inhaled steroids: Secondary | ICD-10-CM | POA: Diagnosis not present

## 2021-12-25 DIAGNOSIS — E559 Vitamin D deficiency, unspecified: Secondary | ICD-10-CM | POA: Diagnosis not present

## 2021-12-25 DIAGNOSIS — D72810 Lymphocytopenia: Secondary | ICD-10-CM | POA: Diagnosis not present

## 2021-12-25 DIAGNOSIS — Z79899 Other long term (current) drug therapy: Secondary | ICD-10-CM | POA: Diagnosis not present

## 2021-12-25 DIAGNOSIS — Z6828 Body mass index (BMI) 28.0-28.9, adult: Secondary | ICD-10-CM | POA: Diagnosis not present

## 2021-12-25 DIAGNOSIS — I358 Other nonrheumatic aortic valve disorders: Secondary | ICD-10-CM | POA: Diagnosis not present

## 2021-12-25 DIAGNOSIS — J984 Other disorders of lung: Secondary | ICD-10-CM | POA: Diagnosis not present

## 2021-12-25 DIAGNOSIS — Z7982 Long term (current) use of aspirin: Secondary | ICD-10-CM | POA: Diagnosis not present

## 2022-01-02 DIAGNOSIS — F411 Generalized anxiety disorder: Secondary | ICD-10-CM | POA: Diagnosis not present

## 2022-01-11 ENCOUNTER — Ambulatory Visit: Payer: Medicare Other | Admitting: Acute Care

## 2022-01-16 ENCOUNTER — Other Ambulatory Visit: Payer: Self-pay | Admitting: Family Medicine

## 2022-01-16 ENCOUNTER — Encounter: Payer: Self-pay | Admitting: Internal Medicine

## 2022-01-16 ENCOUNTER — Ambulatory Visit (INDEPENDENT_AMBULATORY_CARE_PROVIDER_SITE_OTHER): Payer: Medicare Other | Admitting: Internal Medicine

## 2022-01-16 VITALS — BP 120/66 | HR 86 | Temp 97.8°F | Ht 72.0 in | Wt 207.0 lb

## 2022-01-16 DIAGNOSIS — I10 Essential (primary) hypertension: Secondary | ICD-10-CM

## 2022-01-16 DIAGNOSIS — D869 Sarcoidosis, unspecified: Secondary | ICD-10-CM | POA: Diagnosis not present

## 2022-01-16 DIAGNOSIS — J4530 Mild persistent asthma, uncomplicated: Secondary | ICD-10-CM

## 2022-01-16 MED ORDER — BREZTRI AEROSPHERE 160-9-4.8 MCG/ACT IN AERO: 2.0000 | INHALATION_SPRAY | Freq: Two times a day (BID) | RESPIRATORY_TRACT | 6 refills | Status: DC

## 2022-01-16 MED ORDER — LEVOCETIRIZINE DIHYDROCHLORIDE 5 MG PO TABS: 5.0000 mg | ORAL_TABLET | Freq: Every evening | ORAL | 6 refills | Status: AC

## 2022-01-16 MED ORDER — BREZTRI AEROSPHERE 160-9-4.8 MCG/ACT IN AERO: 2.0000 | INHALATION_SPRAY | Freq: Two times a day (BID) | RESPIRATORY_TRACT | 0 refills | Status: DC

## 2022-01-16 MED ORDER — AZITHROMYCIN 250 MG PO TABS: ORAL_TABLET | ORAL | 0 refills | Status: DC

## 2022-01-16 MED ORDER — FLUTICASONE PROPIONATE 50 MCG/ACT NA SUSP: 2.0000 | Freq: Every day | NASAL | 5 refills | Status: DC

## 2022-01-16 NOTE — Progress Notes (Signed)
**CT chest 05/01/2019>> bilateral scattered infiltrates, predominantly upper lobe. **CPAP download 07/22/2018-08/20/2018>> usage greater than 4 hours 26/30 days.  Average usage on days used is 7 hours 9 minutes, pressure range 5-15.  Median pressure 8, 95th percentile pressure 11, maximum pressure 13.  Leaks are within normal limits.  Residual AHI is 4.4 overall this shows good compliance with CPAP with excellent control of obstructive sleep apnea. **HST 06/03/2018>> mild sleep apnea with AHI of 10. **chest x-ray7/6/18>>  hyperinflation consistent with emphysema. **CBC 02/21/17>> eosinophils equals 300.  Chief complaint Follow-up sarcoid Follow-up reactive airways disease Follow-up OSA   HPI Patient with biopsy-proven sarcoidosis in the year 2000 Status post mediastinoscopy Patient has had abnormal CT scan since 2020 Bilateral interstitial lung disease nodular opacities with mediastinal adenopathy with calcifications Previously seen and assessed by [facility] pulmonary  Diagnosed with COVID in 2020 Was not hospitalized Since then she has had chronic cough chest congestions and bronchorrhea  Patient was placed on Trelegy inhaler therapy for reactive airways disease and asthma Patient states that her symptoms are still persistent  May 2023 download Regarding obstructive sleep apnea Compliance report reviewed in detail with the patient Patient has excellent compliance 100% for days 100% for greater than 4 hours AHI reduced to 1.2  Several months ago patient had suspected sarcoid flare patient was given prednisone taper and states that she responded to the therapy but has a persistent cough with postnasal drip and nasal congestion  Patient was not given antibiotics at that time   Pets: Dog Occupation: Retired post Sales promotion account executive Exposures: No known exposures.  No mold, hot tub, Jacuzzi.  No feather pillows or comforters Smoking history: Never smoker Travel history: From [place].  New  to [place] in 2005 Relevant family history: Brother had lung cancer.  He was a smoker.      CT of the chest personally reviewed with the patient in detail from 2021  HRCT Chest 10/03/2021>> Ordered at [facility] by Pulmonology Specialist FINDINGS:  AIRWAYS, LUNGS, PLEURA: Clear central tracheobronchial tree.  No pleural effusion. Multifocal scattered peribronchial nodular consolidation bilaterally. Left apical alveolar airspace consolidation with architectural distortion. Minimal peripheral predominant reticulation and traction bronchiectasis in middle lobe, lingula and bilateral lower lobes.Mild air trapping at the lung bases. No honeycombing   MEDIASTINUM: Normal heart size.  No pericardial effusion.  Mildly dilated 3.2 cm main pulmonary artery. Numerous enlarged and calcified mediastinal and bilateral hilar lymph nodes.   IMAGED ABDOMEN: Partially imaged lobulated multicystic kidneys containing nonobstructing nephrolithiasis. Sequela of Roux-en-Y gastric bypass. Colonic diverticulosis.    CXR 09/10/2021 Bilateral ill-defined nodular densities predominantly in the right upper lobe, likely chronic inflammatory process. No new consolidation. There is no pleural effusion pneumothorax. The cardiac silhouette is within normal limits. No acute osseous pathology. Degenerative changes of the spine.     Latest Ref Rng & Units 10/18/2020   12:01 PM 10/10/2020    1:59 PM 05/11/2020    8:36 AM  CBC  WBC 4.0 - 10.5 K/uL  5.3   5.7    Hemoglobin 12.0 - 15.0 g/dL 11.6   12.2   11.8    Hematocrit 36.0 - 46.0 % 34.0   36.8   35.8    Platelets 150 - 400 K/uL  234   221         Latest Ref Rng & Units 06/11/2021   10:25 AM 04/10/2021   11:05 AM 10/18/2020   12:01 PM  BMP  Glucose 65 - 99  mg/dL 82   86   54    BUN 7 - 25 mg/dL '20   31   23    '$ Creatinine 0.50 - 1.05 mg/dL 1.60   1.64   1.60    BUN/Creat Ratio 6 - 22 (calc) 13   19     Sodium 135 - 146 mmol/L 141   139   143    Potassium 3.5 - 5.3  mmol/L 4.8   4.5   3.5    Chloride 98 - 110 mmol/L 107   104   106    CO2 20 - 32 mmol/L 24   24     Calcium 8.6 - 10.4 mg/dL 9.6   9.7       BNP No results found for: BNP  ProBNP No results found for: PROBNP  PFT    Component Value Date/Time   FEV1PRE 3.54 04/07/2020 1148   FEV1POST 3.55 04/07/2020 1148   FVCPRE 4.71 04/07/2020 1148   FVCPOST 4.61 04/07/2020 1148   TLC 7.48 04/07/2020 1148   DLCOUNC 20.30 04/07/2020 1148   PREFEV1FVCRT 75 04/07/2020 1148   PSTFEV1FVCRT 77 04/07/2020 1148    No results found.   Past medical hx Past Medical History:  Diagnosis Date   Aortic atherosclerosis ([place])    Bruising    Cardiomyopathy ([place])    Celiac artery aneurysm ([facility])    Cervical radiculopathy    CKD (chronic kidney disease), stage III ([facility])    Deficiency of vitamin B    Depression    Edema    Flank pain    Gross hematuria    History of 2019 novel coronavirus disease (COVID-19) 05/01/2019   History of bariatric surgery    HLD (hyperlipidemia)    HTN (hypertension)    Interstitial lung disease ([facility])    Iron deficiency anemia    Leukopenia    OA (osteoarthritis)    Obesity    OSA on CPAP    Osteopenia    Perennial allergic rhinitis    Pneumonia due to COVID-19 virus 2020   Renal cysts, acquired, bilateral    RLS (restless legs syndrome)    Sarcoidosis    Sickle cell trait ([facility])    Snoring    Spinal stenosis    Trigeminal neuralgia      Social History   Tobacco Use   Smoking status: Never   Smokeless tobacco: Never   Tobacco comments:    Second hand smoke exposure  Vaping Use   Vaping Use: Never used  Substance Use Topics   Alcohol use: Not Currently    Alcohol/week: 0.0 standard drinks   Drug use: No    Ms.Ermis reports that she has never smoked. She has never used smokeless tobacco. She reports that she does not currently use alcohol. She reports that she does not use drugs.  Tobacco Cessation: Never smoker, + Second hand smoke  exposure  Past surgical hx, Family hx, Social hx all reviewed.  Current Outpatient Medications on File Prior to Visit  Medication Sig   acetaminophen (TYLENOL) 650 MG CR tablet Take 1,300 mg by mouth every 8 (eight) hours as needed for pain.   albuterol (VENTOLIN HFA) 108 (90 Base) MCG/ACT inhaler TAKE 2 PUFFS BY MOUTH EVERY 6 HOURS AS NEEDED FOR WHEEZE OR SHORTNESS OF BREATH   Ascorbic Acid (VITAMIN C) 1000 MG tablet Take 1,000 mg by mouth daily.   atorvastatin (LIPITOR) 40 MG tablet TAKE 1 TABLET BY MOUTH EVERY DAY   azelastine (  ASTELIN) 0.1 % nasal spray Place 2 sprays into both nostrils 2 (two) times daily. Use in each nostril as directed   budesonide (PULMICORT) 0.5 MG/2ML nebulizer solution Take 2 mLs (0.5 mg total) by nebulization in the morning and at bedtime.   budesonide (PULMICORT) 0.5 MG/2ML nebulizer solution Take 2 mLs (0.5 mg total) by nebulization in the morning and at bedtime.   Calcium Carbonate (CALCIUM 600 PO) Take by mouth.   calcium carbonate (TUMS - DOSED IN MG ELEMENTAL CALCIUM) 500 MG chewable tablet Chew 500-1,000 mg by mouth daily as needed for indigestion or heartburn.   ferrous sulfate 325 (65 FE) MG tablet Take 325 mg by mouth daily with breakfast.   fluticasone (FLONASE) 50 MCG/ACT nasal spray SPRAY 2 SPRAYS INTO EACH NOSTRIL EVERY DAY   gabapentin (NEURONTIN) 100 MG capsule Take 100 mg by mouth at bedtime as needed (pain).   ipratropium-albuterol (DUONEB) 0.5-2.5 (3) MG/3ML SOLN Take 3 mLs by nebulization every 6 (six) hours as needed.   ipratropium-albuterol (DUONEB) 0.5-2.5 (3) MG/3ML SOLN Take 3 mLs by nebulization every 6 (six) hours as needed.   loratadine (CLARITIN) 10 MG tablet Take 1 tablet (10 mg total) by mouth daily.   losartan (COZAAR) 25 MG tablet TAKE 1 TABLET (25 MG TOTAL) BY MOUTH DAILY.   magnesium oxide (MAG-OX) 400 MG tablet Take 400 mg by mouth daily.   metaxalone (SKELAXIN) 800 MG tablet Take 1 tablet (800 mg total) by mouth 3 (three) times  daily as needed for muscle spasms.   montelukast (SINGULAIR) 10 MG tablet TAKE 1 TABLET BY MOUTH EVERYDAY AT BEDTIME   Multiple Vitamins-Minerals (CENTRUM SILVER) tablet Take 1 tablet by mouth daily.   oxyCODONE (OXY IR/ROXICODONE) 5 MG immediate release tablet Take 1 tablet (5 mg total) by mouth every 4 (four) hours as needed for moderate pain (pain score 4-6).   predniSONE (DELTASONE) 10 MG tablet Prednisone taper; 10 mg tablets: 4 tabs x 2 days, 3 tabs x 2 days, 2 tabs x 2 days 1 tab x 2 days then stop.   TRELEGY ELLIPTA 100-62.5-25 MCG/ACT AEPB TAKE 1 PUFF BY MOUTH EVERY DAY   zinc gluconate 50 MG tablet Take 50 mg by mouth daily.   No current facility-administered medications on file prior to visit.     Allergies  Allergen Reactions   Contrast Media [Iodinated Contrast Media]     Unknown reaction   Shellfish Allergy     Edema     Review of Systems:  Gen:  Denies  fever, sweats, chills weight loss  HEENT: Denies blurred vision, double vision, ear pain, eye pain, hearing loss, nose bleeds, sore throat Cardiac:  No dizziness, chest pain or heaviness, chest tightness,edema, No JVD Resp:  +cough, -sputum production, -shortness of breath,-wheezing, -hemoptysis,  Other:  All other systems negative   BP 120/66 (BP Location: Left Arm, Cuff Size: Normal)   Pulse 86   Temp 97.8 F (36.6 C) (Temporal)   Ht 6' (1.829 m)   Wt 207 lb (93.9 kg)   SpO2 100%   BMI 28.07 kg/m     Physical Examination:   General Appearance: No distress  EYES PERRLA, EOM intact.   NECK Supple, No JVD Pulmonary: normal breath sounds, No wheezing. +CRACKLES AT LEFT LUNG BASE CardiovascularNormal S1,S2.  No m/r/g.   Abdomen: Benign, Soft, non-tender. Skin:   warm, no rashes, no ecchymosis  Extremities: normal, no cyanosis, clubbing. Neuro:without focal findings,  speech normal  PSYCHIATRIC: Mood, affect within normal limits.  ALL OTHER ROS ARE NEGATIVE    Assessment/Plan  72 year old  pleasant African-American female with a diagnosis of pulmonary sarcoidosis back in 2000 with a history and diagnosis of obstructive sleep apnea in the setting of remote COVID-19 infection which has exacerbated her symptoms of reactive airways disease and asthma   Pulmonary sarcoid Recommend repeat CT chest to assess for interval changes No indication for prednisone at this time  Reactive airways disease and asthma At this time I would classify it as mild to moderate persistent Consider cough variant at this time as well Plan to stop Trelegy inhaler Start Breztri inhaler to assess interval changes in respiratory status   OSA Patient continues to benefit from CPAP therapy Auto CPAP 5-15 100% compliance for days and greater than 4 hours AHI reduced to 1.2 Excellent compliance report  Consider atypical pneumonia with persistent cough Recommend Z-Pak We will not prescribe prednisone at this time  Follow-up with cardiology as scheduled     MEDICATION ADJUSTMENTS/LABS AND TESTS ORDERED: STOP TRELEGY  STOP MONTELUKAST  START BREZTRI 2 puffs twice daily  START Z PAK ANTIBIOTICS  OBTAIN CT CHEST   CURRENT MEDICATIONS REVIEWED AT LENGTH WITH PATIENT TODAY   Patient  satisfied with Plan of action and management. All questions answered  Follow up 3 months  Total Time Spent  45 mins   Maretta Bees Shomari Pesa, M.D.  [facility]  Medical Director [place] Director [facility] Cardio-Pulmonary Department

## 2022-01-16 NOTE — Patient Instructions (Addendum)
STOP TRELEGY  STOP MONTELUKAST  START BREZTRI 2 puffs twice daily  START Z PAK ANTIBIOTICS  OBTAIN CT CHEST

## 2022-01-30 DIAGNOSIS — F411 Generalized anxiety disorder: Secondary | ICD-10-CM | POA: Diagnosis not present

## 2022-01-30 NOTE — Progress Notes (Signed)
Date:  02/01/2022   ID:  Melissa [name], DOB 1951-03-21, MRN [id number]  Patient Location:  [address] [place] [address]   Provider location:   [facility], [place] office  PCP:  Steele Sizer, MD  Cardiologist:  Arvid Right [facility]  Chief Complaint  Patient presents with   1 year follow up     "Doing well." Medications reviewed by the patient verbally.     History of Present Illness:    Melissa [name] is a 71 y.o. female  past medical history of Atherosclerosis of Aorta seen on CT scan 2018:  hyperlipidemia CRI, likely secondary to polycystic kidney Depression Obesity, Roux-en-Y gastric bypass Back pain Who presents for routine follow-up of her aortic atherosclerosis, hyperlipidemia, back and chest pain, sarcoid in lung and musculoskeletal  Last seen by myself in clinic February 2022  Asthma, got over covid Sarcoidosis: labs reviewed CT scan monitoring lung nodules  Echo 8/21 reviewed, essentially normal study  Does water aerobics 4 days a week, denies shortness of breath no chest pain no leg edema, no PND orthopnea  Going on a cruise in August  EKG personally reviewed by myself on todays visit Normal sinus rhythm rate 74 bpm no significant ST-T wave changes, unchanged from prior studies  Other past medical history reviewed Reviewed CT chest 03/2020 Minimal aortic atherosclerosis, no significant coronary calcification  CT scan abdomen pelvis  mild aortic atherosclerosis extending into the common iliac arteries And kidney stone noted  Myoview in 10/2015 showed no evidence of significant ischemia, EF 57%, low risk scan.   Echo on 11/24/2015 showed an EF of 35-40%, severe hypokinesis of the mid-apicalanteroseptal and anterior myocardium, Gr1DD, mild TR, PASP normal.   echo on 12/05/2015 to reassess the EF showed an EF of 40-45%, mild diffuse hypokinesis, mild concentric LVH, normal size left atrium, RVSF normal, PASP  normal.   Past Medical History:  Diagnosis Date   Aortic atherosclerosis (HCC)    Bruising    Cardiomyopathy ([place])    Celiac artery aneurysm (HCC)    Cervical radiculopathy    CKD (chronic kidney disease), stage III (HCC)    Deficiency of vitamin B    Depression    Edema    Flank pain    Gross hematuria    History of 2019 novel coronavirus disease (COVID-19) 05/01/2019   History of bariatric surgery    HLD (hyperlipidemia)    HTN (hypertension)    Interstitial lung disease (HCC)    Iron deficiency anemia    Leukopenia    OA (osteoarthritis)    Obesity    OSA on CPAP    Osteopenia    Perennial allergic rhinitis    Pneumonia due to COVID-19 virus 2020   Renal cysts, acquired, bilateral    RLS (restless legs syndrome)    Sarcoidosis    Sickle cell trait (HCC)    Snoring    Spinal stenosis    Trigeminal neuralgia    Past Surgical History:  Procedure Laterality Date   BARIATRIC SURGERY     CESAREAN SECTION     3 or more   COLONOSCOPY WITH PROPOFOL N/A 03/15/2019   Procedure: COLONOSCOPY WITH PROPOFOL;  Surgeon: [name], MD;  Location: [facility] ENDOSCOPY;  Service: Gastroenterology;  Laterality: N/A;   GASTRIC BYPASS     KNEE ARTHROPLASTY Right 10/18/2020   Procedure: COMPUTER ASSISTED TOTAL KNEE ARTHROPLASTY;  Surgeon: Dereck Leep, MD;  Location: [facility] ORS;  Service: Orthopedics;  Laterality: Right;   SHOULDER ARTHROSCOPY Right    TUBAL LIGATION       Current Meds  Medication Sig   acetaminophen (TYLENOL) 650 MG CR tablet Take 1,300 mg by mouth every 8 (eight) hours as needed for pain.   albuterol (VENTOLIN HFA) 108 (90 Base) MCG/ACT inhaler TAKE 2 PUFFS BY MOUTH EVERY 6 HOURS AS NEEDED FOR WHEEZE OR SHORTNESS OF BREATH   Ascorbic Acid (VITAMIN C) 1000 MG tablet Take 1,000 mg by mouth daily.   aspirin EC 81 MG tablet Take 1 tablet by mouth daily.   atorvastatin (LIPITOR) 40 MG tablet TAKE 1 TABLET BY MOUTH EVERY DAY   azelastine (ASTELIN) 0.1 % nasal spray  Place 2 sprays into both nostrils 2 (two) times daily. Use in each nostril as directed   Budeson-Glycopyrrol-Formoterol (BREZTRI AEROSPHERE) 160-9-4.8 MCG/ACT AERO Inhale 2 puffs into the lungs in the morning and at bedtime.   budesonide (PULMICORT) 0.5 MG/2ML nebulizer solution Take 2 mLs (0.5 mg total) by nebulization in the morning and at bedtime.   Calcium Carbonate (CALCIUM 600 PO) Take by mouth.   calcium carbonate (TUMS - DOSED IN MG ELEMENTAL CALCIUM) 500 MG chewable tablet Chew 500-1,000 mg by mouth daily as needed for indigestion or heartburn.   ferrous sulfate 325 (65 FE) MG tablet Take 325 mg by mouth daily with breakfast.   fluticasone (FLONASE) 50 MCG/ACT nasal spray SPRAY 2 SPRAYS INTO EACH NOSTRIL EVERY DAY   gabapentin (NEURONTIN) 100 MG capsule Take 100 mg by mouth at bedtime as needed (pain).   ipratropium-albuterol (DUONEB) 0.5-2.5 (3) MG/3ML SOLN Take 3 mLs by nebulization every 6 (six) hours as needed.   levocetirizine (XYZAL ALLERGY 24HR) 5 MG tablet Take 1 tablet (5 mg total) by mouth every evening.   loratadine (CLARITIN) 10 MG tablet Take 1 tablet (10 mg total) by mouth daily.   losartan (COZAAR) 25 MG tablet TAKE 1 TABLET (25 MG TOTAL) BY MOUTH DAILY.   magnesium oxide (MAG-OX) 400 MG tablet Take 400 mg by mouth daily.   metaxalone (SKELAXIN) 800 MG tablet Take 1 tablet (800 mg total) by mouth 3 (three) times daily as needed for muscle spasms.   Multiple Vitamins-Minerals (CENTRUM SILVER) tablet Take 1 tablet by mouth daily.   Probiotic Product (FORTIFY PROBIOTIC WOMENS EX ST PO) Take by mouth daily.   zinc gluconate 50 MG tablet Take 50 mg by mouth daily.     Allergies:   Contrast media [iodinated contrast media] and Shellfish allergy   Social History   Tobacco Use   Smoking status: Never   Smokeless tobacco: Never   Tobacco comments:    Second hand smoke exposure  Vaping Use   Vaping Use: Never used  Substance Use Topics   Alcohol use: Not Currently     Alcohol/week: 0.0 standard drinks of alcohol   Drug use: No     Family Hx: The patient's family history includes Alcohol abuse in her brother and father; Colon cancer in her sister; Diabetes Mellitus II in her sister; Heart disease in her mother; Hypercholesterolemia in her mother; Hypertension in her maternal grandmother and mother; Lung cancer in her brother. There is no history of Breast cancer, Prostate cancer, Bladder Cancer, or Kidney cancer.  ROS:   Please see the history of present illness.    Review of Systems  Constitutional: Negative.   HENT: Negative.    Respiratory: Negative.    Cardiovascular: Negative.   Gastrointestinal: Negative.   Musculoskeletal:  Negative.   Neurological: Negative.   Psychiatric/Behavioral: Negative.    All other systems reviewed and are negative.    Labs/Other Tests and Data Reviewed:    Recent Labs: 04/10/2021: ALT 20 06/11/2021: BUN 20; Creat 1.60; Potassium 4.8; Sodium 141   Recent Lipid Panel Lab Results  Component Value Date/Time   CHOL 202 (H) 04/10/2021 11:05 AM   CHOL 193 03/27/2020 09:56 AM   TRIG 72 04/10/2021 11:05 AM   HDL 92 04/10/2021 11:05 AM   HDL 88 03/27/2020 09:56 AM   CHOLHDL 2.2 04/10/2021 11:05 AM   LDLCALC 94 04/10/2021 11:05 AM    Wt Readings from Last 3 Encounters:  02/01/22 210 lb (95.3 kg)  01/16/22 207 lb (93.9 kg)  12/07/21 202 lb 6.4 oz (91.8 kg)     Exam:   BP 120/70 (BP Location: Left Arm, Patient Position: Sitting, Cuff Size: Normal)   Pulse 74   Ht 6' (1.829 m)   Wt 210 lb (95.3 kg)   SpO2 98%   BMI 28.48 kg/m  Constitutional:  oriented to person, place, and time. No distress.  HENT:  Head: Grossly normal Eyes:  no discharge. No scleral icterus.  Neck: No JVD, no carotid bruits  Cardiovascular: Regular rate and rhythm, no murmurs appreciated Pulmonary/Chest: Clear to auscultation bilaterally, no wheezes or rails Abdominal: Soft.  no distension.  no tenderness.  Musculoskeletal: Normal  range of motion Neurological:  normal muscle tone. Coordination normal. No atrophy Skin: Skin warm and dry Psychiatric: normal affect, pleasant  ASSESSMENT & PLAN:    Problem List Items Addressed This Visit       Cardiology Problems   Hypertension, benign   Relevant Medications   aspirin EC 81 MG tablet   Atherosclerosis of aorta (HCC) - Primary   Relevant Medications   aspirin EC 81 MG tablet   Other Relevant Orders   EKG 12-Lead   Other Visit Diagnoses     Stage 3a chronic kidney disease ([place])       Relevant Orders   EKG 12-Lead   Mixed hyperlipidemia       Relevant Medications   aspirin EC 81 MG tablet   Dyspnea, unspecified type       Relevant Orders   EKG 12-Lead     Leg edema  Minimal swelling on today's visit, no change  Anemia Likely related to underlying kidney failure Stable  Aortic atherosclerosis disease on CT scan Add zetia 10 mg daily  Hyperlipidemia Reports compliance with her Lipitor 40 Total cholesterol remains 200 recommend she add Zetia  Sarcoid pulmonary/musculoskeletal No indication of cardiac sarcoid Normal echocardiogram, normal EKG, no new symptoms  Back and chest pain Does well aerobics 4 days a week symptoms stable   Total encounter time more than 30 minutes  Greater than 50% was spent in counseling and coordination of care with the patient  Signed, Ida Rogue, MD  02/01/2022 10:00 AM    [place] Office [address], [place], [place] [address]

## 2022-02-01 ENCOUNTER — Ambulatory Visit (INDEPENDENT_AMBULATORY_CARE_PROVIDER_SITE_OTHER): Payer: Medicare Other | Admitting: Cardiovascular Disease

## 2022-02-01 ENCOUNTER — Encounter: Payer: Self-pay | Admitting: Cardiovascular Disease

## 2022-02-01 VITALS — BP 120/70 | HR 74 | Ht 72.0 in | Wt 210.0 lb

## 2022-02-01 DIAGNOSIS — I700 Atherosclerosis of aorta: Secondary | ICD-10-CM

## 2022-02-01 DIAGNOSIS — E782 Mixed hyperlipidemia: Secondary | ICD-10-CM

## 2022-02-01 DIAGNOSIS — I10 Essential (primary) hypertension: Secondary | ICD-10-CM | POA: Diagnosis not present

## 2022-02-01 DIAGNOSIS — N1831 Chronic kidney disease, stage 3a: Secondary | ICD-10-CM | POA: Diagnosis not present

## 2022-02-01 DIAGNOSIS — R0600 Dyspnea, unspecified: Secondary | ICD-10-CM

## 2022-02-01 MED ORDER — EZETIMIBE 10 MG PO TABS: 10.0000 mg | ORAL_TABLET | Freq: Every day | ORAL | 3 refills | Status: DC

## 2022-02-01 NOTE — Patient Instructions (Addendum)
Medication Instructions:  Please start zetia/ezetimibe 10 mg daily  If you need a refill on your cardiac medications before your next appointment, please call your pharmacy.   Lab work: No new labs needed  Testing/Procedures: No new testing needed  Follow-Up: At [facility], you and your health needs are our priority.  As part of our continuing mission to provide you with exceptional heart care, we have created designated Provider Care Teams.  These Care Teams include your primary Cardiologist (physician) and Advanced Practice Providers (APPs -  Physician Assistants and Nurse Practitioners) who all work together to provide you with the care you need, when you need it.  You will need a follow up appointment in 12 months  Providers on your designated Care Team:   Murray Hodgkins, NP Christell Faith, [name] Cadence Kathlen Mody, [place]  COVID-19 Vaccine Information can be found at: [email or web address] For questions related to vaccine distribution or appointments, please email [email or web address] or call [phone].

## 2022-02-06 ENCOUNTER — Ambulatory Visit: Payer: Self-pay

## 2022-02-06 NOTE — Telephone Encounter (Signed)
    Chief Complaint: Muscle cramping Symptoms: Legs, feet,neck Frequency: 1 month ago Pertinent Negatives: Patient denies  Disposition: '[]'$ ED /'[]'$ Urgent Care (no appt availability in office) / '[x]'$ Appointment(In office/virtual)/ '[]'$  [facility]/ '[]'$ Home Care/ '[]'$ Refused Recommended Disposition /'[]'$ [facility]/ '[]'$  Follow-up with PCP Additional Notes:   Reason for Disposition  [1] MODERATE pain (e.g., interferes with normal activities) AND [2] present > 3 days  Answer Assessment - Initial Assessment Questions 1. ONSET: "When did the muscle aches or body pains start?"      1 month ago 2. LOCATION: "What part of your body is hurting?" (e.g., entire body, arms, legs)      Neck, abdomen, feet 3. SEVERITY: "How bad is the pain?" (Scale 1-10; or mild, moderate, severe)   - MILD (1-3): doesn't interfere with normal activities    - MODERATE (4-7): interferes with normal activities or awakens from sleep    - SEVERE (8-10):  excruciating pain, unable to do any normal activities      Feels tight 4. CAUSE: "What do you think is causing the pains?"     Cramping 5. FEVER: "Have you been having fever?"     No 6. OTHER SYMPTOMS: "Do you have any other symptoms?" (e.g., chest pain, weakness, rash, cold or flu symptoms, weight loss)     No 7. PREGNANCY: "Is there any chance you are pregnant?" "When was your last menstrual period?"     No 8. TRAVEL: "Have you traveled out of the country in the last month?" (e.g., travel history, exposures)     No  Protocols used: Muscle Aches and Body Pain-A-AH

## 2022-02-06 NOTE — Telephone Encounter (Signed)
Lvm for pt to return call. She has the option of seeing Dr Ky Barban today. If Dr Ancil Boozer still have an opening for tomorrow you can put her there.

## 2022-02-07 DIAGNOSIS — I129 Hypertensive chronic kidney disease with stage 1 through stage 4 chronic kidney disease, or unspecified chronic kidney disease: Secondary | ICD-10-CM | POA: Diagnosis not present

## 2022-02-07 DIAGNOSIS — D631 Anemia in chronic kidney disease: Secondary | ICD-10-CM | POA: Diagnosis not present

## 2022-02-07 DIAGNOSIS — Q613 Polycystic kidney, unspecified: Secondary | ICD-10-CM | POA: Diagnosis not present

## 2022-02-07 DIAGNOSIS — N1832 Chronic kidney disease, stage 3b: Secondary | ICD-10-CM | POA: Diagnosis not present

## 2022-02-07 NOTE — Progress Notes (Unsigned)
Name: Melissa [name]   MRN: [id number]    DOB: 19-Feb-1951   Date:02/07/2022       Progress Note  Subjective  Chief Complaint  Muscle Cramping  HPI  *** Patient Active Problem List   Diagnosis Date Noted   Hematuria 01/03/2021   Total knee replacement status 10/18/2020   Primary osteoarthritis of right hip 08/06/2020   Sarcoidosis 05/29/2020   Aneurysm of celiac artery ([place]) 02/11/2020   COVID-19 virus infection 09/06/2019   Localized swelling of right lower leg 09/06/2019   OSA on CPAP 09/06/2019   Secondary hyperparathyroidism of renal origin ([place]) 07/21/2019   Anemia in chronic kidney disease 06/14/2019   Benign hypertensive kidney disease with chronic kidney disease 06/14/2019   Sickle cell trait ([place]) 06/14/2019   Chronic kidney disease, stage 3b ([place]) 06/14/2019   Chronic bronchitis ([place]) 05/19/2019   Chronic venous insufficiency 04/15/2019   Lymphedema 04/15/2019   Swelling of limb 03/30/2019   Family history of colon cancer    Trigeminal neuralgia 09/11/2018   Cardiac LV ejection fraction of 40-49% 05/07/2017   Low back pain without sciatica 02/21/2017   Renal cyst 10/18/2016   Atherosclerosis of aorta ([place]) 10/16/2016   History of colonic polyps 09/25/2016   Dyslipidemia 12/08/2015   Gastroesophageal reflux disease without esophagitis 08/11/2015   Primary osteoarthritis of knee 06/13/2015   Hip bursitis 06/13/2015   RLS (restless legs syndrome) 06/13/2015   Depression, major, in remission ([place]) 06/13/2015   B12 deficiency 06/13/2015   History of Roux-en-Y gastric bypass 06/13/2015   History of iron deficiency anemia 06/13/2015   Vitamin D deficiency 06/13/2015   Hypertension, benign 06/13/2015   Perennial allergic rhinitis with seasonal variation 06/13/2015   Obesity (BMI 30.0-34.9) 06/13/2015   Primary osteoarthritis of both knees 06/13/2015   Cervical radiculitis 01/04/2014   Cervical spinal stenosis 01/04/2014   Cervical osteoarthritis 01/04/2014     Past Surgical History:  Procedure Laterality Date   BARIATRIC SURGERY     CESAREAN SECTION     3 or more   COLONOSCOPY WITH PROPOFOL N/A 03/15/2019   Procedure: COLONOSCOPY WITH PROPOFOL;  Surgeon: [name], MD;  Location: [facility] ENDOSCOPY;  Service: Gastroenterology;  Laterality: N/A;   GASTRIC BYPASS     KNEE ARTHROPLASTY Right 10/18/2020   Procedure: COMPUTER ASSISTED TOTAL KNEE ARTHROPLASTY;  Surgeon: Dereck Leep, MD;  Location: [facility] ORS;  Service: Orthopedics;  Laterality: Right;   SHOULDER ARTHROSCOPY Right    TUBAL LIGATION      Family History  Problem Relation Age of Onset   Hypercholesterolemia Mother    Heart disease Mother    Hypertension Mother    Alcohol abuse Father    Lung cancer Brother    Alcohol abuse Brother    Diabetes Mellitus II Sister    Hypertension Maternal Grandmother    Colon cancer Sister    Breast cancer Neg Hx    Prostate cancer Neg Hx    Bladder Cancer Neg Hx    Kidney cancer Neg Hx     Social History   Tobacco Use   Smoking status: Never   Smokeless tobacco: Never   Tobacco comments:    Second hand smoke exposure  Substance Use Topics   Alcohol use: Not Currently    Alcohol/week: 0.0 standard drinks of alcohol     Current Outpatient Medications:    acetaminophen (TYLENOL) 650 MG CR tablet, Take 1,300 mg by mouth every 8 (eight) hours as needed for pain., Disp: , Rfl:  albuterol (VENTOLIN HFA) 108 (90 Base) MCG/ACT inhaler, TAKE 2 PUFFS BY MOUTH EVERY 6 HOURS AS NEEDED FOR WHEEZE OR SHORTNESS OF BREATH, Disp: 18 each, Rfl: 0   Ascorbic Acid (VITAMIN C) 1000 MG tablet, Take 1,000 mg by mouth daily., Disp: , Rfl:    aspirin EC 81 MG tablet, Take 1 tablet by mouth daily., Disp: , Rfl:    atorvastatin (LIPITOR) 40 MG tablet, TAKE 1 TABLET BY MOUTH EVERY DAY, Disp: 90 tablet, Rfl: 0   azelastine (ASTELIN) 0.1 % nasal spray, Place 2 sprays into both nostrils 2 (two) times daily. Use in each nostril as directed, Disp: 30 mL,  Rfl: 12   azithromycin (ZITHROMAX Z-PAK) 250 MG tablet, Take 2 tablets on Day 1 and then 1 tablet daily till gone. (Patient not taking: Reported on 02/01/2022), Disp: 6 each, Rfl: 0   Budeson-Glycopyrrol-Formoterol (BREZTRI AEROSPHERE) 160-9-4.8 MCG/ACT AERO, Inhale 2 puffs into the lungs in the morning and at bedtime., Disp: 1 each, Rfl: 6   Budeson-Glycopyrrol-Formoterol (BREZTRI AEROSPHERE) 160-9-4.8 MCG/ACT AERO, Inhale 2 puffs into the lungs in the morning and at bedtime. (Patient not taking: Reported on 02/01/2022), Disp: 10.7 g, Rfl: 0   budesonide (PULMICORT) 0.5 MG/2ML nebulizer solution, Take 2 mLs (0.5 mg total) by nebulization in the morning and at bedtime., Disp: 120 mL, Rfl: 6   Calcium Carbonate (CALCIUM 600 PO), Take by mouth., Disp: , Rfl:    calcium carbonate (TUMS - DOSED IN MG ELEMENTAL CALCIUM) 500 MG chewable tablet, Chew 500-1,000 mg by mouth daily as needed for indigestion or heartburn., Disp: , Rfl:    ezetimibe (ZETIA) 10 MG tablet, Take 1 tablet (10 mg total) by mouth daily., Disp: 90 tablet, Rfl: 3   ferrous sulfate 325 (65 FE) MG tablet, Take 325 mg by mouth daily with breakfast., Disp: , Rfl:    fluticasone (FLONASE) 50 MCG/ACT nasal spray, SPRAY 2 SPRAYS INTO EACH NOSTRIL EVERY DAY, Disp: 48 mL, Rfl: 1   fluticasone (FLONASE) 50 MCG/ACT nasal spray, Place 2 sprays into both nostrils daily. (Patient not taking: Reported on 02/01/2022), Disp: 1 g, Rfl: 5   gabapentin (NEURONTIN) 100 MG capsule, Take 100 mg by mouth at bedtime as needed (pain)., Disp: , Rfl:    ipratropium-albuterol (DUONEB) 0.5-2.5 (3) MG/3ML SOLN, Take 3 mLs by nebulization every 6 (six) hours as needed., Disp: 360 mL, Rfl: 0   ipratropium-albuterol (DUONEB) 0.5-2.5 (3) MG/3ML SOLN, Take 3 mLs by nebulization every 6 (six) hours as needed. (Patient not taking: Reported on 02/01/2022), Disp: 360 mL, Rfl: 3   levocetirizine (XYZAL ALLERGY 24HR) 5 MG tablet, Take 1 tablet (5 mg total) by mouth every evening.,  Disp: 30 tablet, Rfl: 6   loratadine (CLARITIN) 10 MG tablet, Take 1 tablet (10 mg total) by mouth daily., Disp: 90 tablet, Rfl: 0   losartan (COZAAR) 25 MG tablet, TAKE 1 TABLET (25 MG TOTAL) BY MOUTH DAILY., Disp: 90 tablet, Rfl: 0   magnesium oxide (MAG-OX) 400 MG tablet, Take 400 mg by mouth daily., Disp: , Rfl:    metaxalone (SKELAXIN) 800 MG tablet, Take 1 tablet (800 mg total) by mouth 3 (three) times daily as needed for muscle spasms., Disp: 90 tablet, Rfl: 0   montelukast (SINGULAIR) 10 MG tablet, TAKE 1 TABLET BY MOUTH EVERYDAY AT BEDTIME (Patient not taking: Reported on 02/01/2022), Disp: 90 tablet, Rfl: 1   Multiple Vitamins-Minerals (CENTRUM SILVER) tablet, Take 1 tablet by mouth daily., Disp: 30 tablet, Rfl: 0   oxyCODONE (OXY  IR/ROXICODONE) 5 MG immediate release tablet, Take 1 tablet (5 mg total) by mouth every 4 (four) hours as needed for moderate pain (pain score 4-6). (Patient not taking: Reported on 01/16/2022), Disp: 30 tablet, Rfl: 0   Probiotic Product (FORTIFY PROBIOTIC WOMENS EX ST PO), Take by mouth daily., Disp: , Rfl:    zinc gluconate 50 MG tablet, Take 50 mg by mouth daily., Disp: , Rfl:   Allergies  Allergen Reactions   Contrast Media [Iodinated Contrast Media]     Unknown reaction   Shellfish Allergy     Edema    I personally reviewed active problem list, medication list, allergies, family history, social history, health maintenance with the patient/caregiver today.   ROS  ***  Objective  There were no vitals filed for this visit.  There is no height or weight on file to calculate BMI.  Physical Exam ***  Recent Results (from the past 2160 hour(s))  POCT EXHALED NITRIC OXIDE     Status: Abnormal   Collection Time: 12/07/21 10:47 AM  Result Value Ref Range   FeNO level (ppb) 42     PHQ2/9:    10/23/2021    1:46 PM 07/25/2021    1:16 PM 04/10/2021   10:06 AM 11/01/2020   10:19 AM 10/12/2020    2:45 PM  Depression screen PHQ 2/9  Decreased  Interest 0 0 0 0 0  Down, Depressed, Hopeless 0 0 0 0 0  PHQ - 2 Score 0 0 0 0 0  Altered sleeping 3 0     Tired, decreased energy 0 0     Change in appetite 0 0     Feeling bad or failure about yourself  0 0     Trouble concentrating 0 0     Moving slowly or fidgety/restless 0 0     Suicidal thoughts 0 0     PHQ-9 Score 3 0     Difficult doing work/chores Not difficult at all        phq 9 is {gen pos [id number]}   Fall Risk:    10/23/2021    1:56 PM 07/25/2021    1:16 PM 04/10/2021   10:06 AM 11/01/2020   10:19 AM 10/12/2020    2:47 PM  Fall Risk   Falls in the past year? 0 0 0 1 1  Number falls in past yr: 0 0 0 0 0  Injury with Fall? 0 0 0 0 0  Risk for fall due to : No Fall Risks No Fall Risks No Fall Risks History of fall(s) No Fall Risks  Follow up Falls prevention discussed Falls prevention discussed Falls prevention discussed Falls prevention discussed Falls prevention discussed      Functional Status Survey:      Assessment & Plan  *** There are no diagnoses linked to this encounter.

## 2022-02-08 ENCOUNTER — Ambulatory Visit (INDEPENDENT_AMBULATORY_CARE_PROVIDER_SITE_OTHER): Payer: Medicare Other | Admitting: Family Medicine

## 2022-02-08 ENCOUNTER — Encounter: Payer: Self-pay | Admitting: Family Medicine

## 2022-02-08 ENCOUNTER — Telehealth: Payer: Self-pay

## 2022-02-08 VITALS — BP 110/62 | HR 97 | Resp 16 | Ht 72.0 in | Wt 210.0 lb

## 2022-02-08 DIAGNOSIS — F325 Major depressive disorder, single episode, in full remission: Secondary | ICD-10-CM | POA: Diagnosis not present

## 2022-02-08 DIAGNOSIS — I700 Atherosclerosis of aorta: Secondary | ICD-10-CM

## 2022-02-08 DIAGNOSIS — Z9989 Dependence on other enabling machines and devices: Secondary | ICD-10-CM

## 2022-02-08 DIAGNOSIS — G4733 Obstructive sleep apnea (adult) (pediatric): Secondary | ICD-10-CM

## 2022-02-08 DIAGNOSIS — D573 Sickle-cell trait: Secondary | ICD-10-CM | POA: Diagnosis not present

## 2022-02-08 DIAGNOSIS — I10 Essential (primary) hypertension: Secondary | ICD-10-CM | POA: Diagnosis not present

## 2022-02-08 DIAGNOSIS — D860 Sarcoidosis of lung: Secondary | ICD-10-CM

## 2022-02-08 DIAGNOSIS — M62838 Other muscle spasm: Secondary | ICD-10-CM

## 2022-02-08 DIAGNOSIS — I728 Aneurysm of other specified arteries: Secondary | ICD-10-CM

## 2022-02-08 DIAGNOSIS — N1832 Chronic kidney disease, stage 3b: Secondary | ICD-10-CM

## 2022-02-08 DIAGNOSIS — N2581 Secondary hyperparathyroidism of renal origin: Secondary | ICD-10-CM | POA: Diagnosis not present

## 2022-02-08 MED ORDER — LOSARTAN POTASSIUM 25 MG PO TABS: 25.0000 mg | ORAL_TABLET | Freq: Every day | ORAL | 1 refills | Status: DC

## 2022-02-08 MED ORDER — METAXALONE 800 MG PO TABS: 800.0000 mg | ORAL_TABLET | Freq: Three times a day (TID) | ORAL | 0 refills | Status: DC | PRN

## 2022-02-08 MED ORDER — ATORVASTATIN CALCIUM 40 MG PO TABS: 40.0000 mg | ORAL_TABLET | Freq: Every day | ORAL | 1 refills | Status: DC

## 2022-02-11 ENCOUNTER — Other Ambulatory Visit: Payer: Self-pay

## 2022-02-11 DIAGNOSIS — Z1211 Encounter for screening for malignant neoplasm of colon: Secondary | ICD-10-CM

## 2022-02-11 MED ORDER — PEG 3350-KCL-NA BICARB-NACL 420 G PO SOLR: 4000.0000 mL | Freq: Once | ORAL | 0 refills | Status: AC

## 2022-02-13 ENCOUNTER — Telehealth: Payer: Self-pay | Admitting: Family Medicine

## 2022-02-13 DIAGNOSIS — F411 Generalized anxiety disorder: Secondary | ICD-10-CM | POA: Diagnosis not present

## 2022-02-13 NOTE — Telephone Encounter (Signed)
Received, waiting for [place] w/ recent OV notes attached.

## 2022-02-13 NOTE — Telephone Encounter (Signed)
Copied from [place] [id number]. Topic: General - Inquiry >> Feb 13, 2022  9:43 AM Chapman Fitch wrote: Reason for CRM: [employer] /sent a fax for compression socks/ please confirm if received

## 2022-02-25 ENCOUNTER — Telehealth: Payer: Self-pay | Admitting: Family Medicine

## 2022-02-25 NOTE — Telephone Encounter (Signed)
Copied from [place]. Topic: Referral - Question >> Feb 25, 2022 10:47 AM Erskine Squibb wrote: Reason for CRM: Patient is checking in regarding a referral to a neurologist that was discussed at her last appointment with Dr Ancil Boozer. Please assist patient further.

## 2022-03-13 DIAGNOSIS — F411 Generalized anxiety disorder: Secondary | ICD-10-CM | POA: Diagnosis not present

## 2022-03-15 ENCOUNTER — Ambulatory Visit: Payer: Medicare Other | Admitting: Anesthesiology

## 2022-03-15 ENCOUNTER — Encounter
Admission: RE | Disposition: A | Discharge status: Home / Self Care | Payer: Self-pay | Source: Home / Self Care | Attending: Gastroenterology

## 2022-03-15 ENCOUNTER — Ambulatory Visit
Admission: RE | Admit: 2022-03-15 | Discharge: 2022-03-15 | Disposition: A | Discharge status: Home / Self Care | Payer: Medicare Other | Attending: Gastroenterology | Admitting: Gastroenterology

## 2022-03-15 ENCOUNTER — Encounter: Payer: Self-pay | Admitting: Gastroenterology

## 2022-03-15 DIAGNOSIS — G4730 Sleep apnea, unspecified: Secondary | ICD-10-CM | POA: Diagnosis not present

## 2022-03-15 DIAGNOSIS — Z8616 Personal history of COVID-19: Secondary | ICD-10-CM | POA: Diagnosis not present

## 2022-03-15 DIAGNOSIS — Z1211 Encounter for screening for malignant neoplasm of colon: Secondary | ICD-10-CM | POA: Diagnosis not present

## 2022-03-15 DIAGNOSIS — D124 Benign neoplasm of descending colon: Secondary | ICD-10-CM | POA: Diagnosis not present

## 2022-03-15 DIAGNOSIS — F32A Depression, unspecified: Secondary | ICD-10-CM | POA: Diagnosis not present

## 2022-03-15 DIAGNOSIS — K5790 Diverticulosis of intestine, part unspecified, without perforation or abscess without bleeding: Secondary | ICD-10-CM | POA: Diagnosis not present

## 2022-03-15 DIAGNOSIS — K219 Gastro-esophageal reflux disease without esophagitis: Secondary | ICD-10-CM | POA: Insufficient documentation

## 2022-03-15 DIAGNOSIS — K5730 Diverticulosis of large intestine without perforation or abscess without bleeding: Secondary | ICD-10-CM | POA: Diagnosis not present

## 2022-03-15 DIAGNOSIS — D120 Benign neoplasm of cecum: Secondary | ICD-10-CM | POA: Insufficient documentation

## 2022-03-15 DIAGNOSIS — N183 Chronic kidney disease, stage 3 unspecified: Secondary | ICD-10-CM | POA: Diagnosis not present

## 2022-03-15 DIAGNOSIS — J45909 Unspecified asthma, uncomplicated: Secondary | ICD-10-CM | POA: Insufficient documentation

## 2022-03-15 DIAGNOSIS — Z9884 Bariatric surgery status: Secondary | ICD-10-CM | POA: Diagnosis not present

## 2022-03-15 DIAGNOSIS — D631 Anemia in chronic kidney disease: Secondary | ICD-10-CM | POA: Insufficient documentation

## 2022-03-15 DIAGNOSIS — D126 Benign neoplasm of colon, unspecified: Secondary | ICD-10-CM | POA: Diagnosis not present

## 2022-03-15 DIAGNOSIS — I129 Hypertensive chronic kidney disease with stage 1 through stage 4 chronic kidney disease, or unspecified chronic kidney disease: Secondary | ICD-10-CM | POA: Diagnosis not present

## 2022-03-15 DIAGNOSIS — Z86010 Personal history of colonic polyps: Secondary | ICD-10-CM | POA: Diagnosis not present

## 2022-03-15 DIAGNOSIS — I429 Cardiomyopathy, unspecified: Secondary | ICD-10-CM | POA: Diagnosis not present

## 2022-03-15 DIAGNOSIS — K635 Polyp of colon: Secondary | ICD-10-CM | POA: Diagnosis not present

## 2022-03-15 DIAGNOSIS — I700 Atherosclerosis of aorta: Secondary | ICD-10-CM | POA: Insufficient documentation

## 2022-03-15 DIAGNOSIS — D123 Benign neoplasm of transverse colon: Secondary | ICD-10-CM | POA: Diagnosis not present

## 2022-03-15 HISTORY — DX: Unspecified asthma, uncomplicated: J45.909

## 2022-03-15 HISTORY — PX: COLONOSCOPY WITH PROPOFOL: SHX5780

## 2022-03-15 SURGERY — COLONOSCOPY WITH PROPOFOL
Anesthesia: General

## 2022-03-15 MED ORDER — SODIUM CHLORIDE 0.9 % IV SOLN: INTRAVENOUS | Status: DC | PRN

## 2022-03-15 MED ORDER — PROPOFOL 10 MG/ML IV BOLUS
INTRAVENOUS | Status: DC | PRN
  Administered 2022-03-15: 40 mg via INTRAVENOUS

## 2022-03-15 MED ORDER — PROPOFOL 500 MG/50ML IV EMUL
INTRAVENOUS | Status: DC | PRN
  Administered 2022-03-15: 150 ug/kg/min via INTRAVENOUS

## 2022-03-15 MED ORDER — PROPOFOL 1000 MG/100ML IV EMUL
INTRAVENOUS | Status: AC
  Filled 2022-03-15: qty 100

## 2022-03-15 MED ORDER — FENTANYL CITRATE (PF) 100 MCG/2ML IJ SOLN
INTRAMUSCULAR | Status: AC
  Filled 2022-03-15: qty 2

## 2022-03-15 MED ORDER — SODIUM CHLORIDE 0.9 % IV SOLN: INTRAVENOUS | Status: DC

## 2022-03-15 MED ORDER — MIDAZOLAM HCL 2 MG/2ML IJ SOLN
INTRAMUSCULAR | Status: AC
  Filled 2022-03-15: qty 2

## 2022-03-15 MED ORDER — MIDAZOLAM HCL 2 MG/2ML IJ SOLN
INTRAMUSCULAR | Status: DC | PRN
  Administered 2022-03-15: 2 mg via INTRAVENOUS

## 2022-03-15 MED ORDER — FENTANYL CITRATE (PF) 100 MCG/2ML IJ SOLN
INTRAMUSCULAR | Status: DC | PRN
  Administered 2022-03-15: 25 ug via INTRAVENOUS

## 2022-03-15 NOTE — Anesthesia Preprocedure Evaluation (Signed)
Anesthesia Evaluation  Patient identified by MRN, date of birth, ID band Patient awake    Reviewed: Allergy & Precautions, NPO status , Patient's Chart, lab work & pertinent test results  Airway Mallampati: II  TM Distance: >3 FB Neck ROM: Full    Dental  (+) Teeth Intact   Pulmonary neg pulmonary ROS, asthma , sleep apnea and Continuous Positive Airway Pressure Ventilation ,    Pulmonary exam normal breath sounds clear to auscultation       Cardiovascular Exercise Tolerance: Good hypertension, Pt. on medications negative cardio ROS Normal cardiovascular exam Rhythm:Regular     Neuro/Psych Depression negative neurological ROS  negative psych ROS   GI/Hepatic negative GI ROS, Neg liver ROS, GERD  Medicated,  Endo/Other  negative endocrine ROS  Renal/GU negative Renal ROS  negative genitourinary   Musculoskeletal   Abdominal Normal abdominal exam  (+)   Peds negative pediatric ROS (+)  Hematology negative hematology ROS (+) Blood dyscrasia, anemia ,   Anesthesia Other Findings Past Medical History: No date: Aortic atherosclerosis (HCC) No date: Asthma No date: Bruising No date: Cardiomyopathy [facility]) No date: Celiac artery aneurysm (HCC) No date: Cervical radiculopathy No date: CKD (chronic kidney disease), stage III (HCC) No date: Deficiency of vitamin B No date: Depression No date: Edema No date: Flank pain No date: Gross hematuria 05/01/2019: History of 2019 novel coronavirus disease (COVID-19) No date: History of bariatric surgery No date: HLD (hyperlipidemia) No date: HTN (hypertension) No date: Interstitial lung disease (HCC) No date: Iron deficiency anemia No date: Leukopenia No date: OA (osteoarthritis) No date: Obesity No date: OSA on CPAP No date: Osteopenia No date: Perennial allergic rhinitis 2020: Pneumonia due to COVID-19 virus No date: Renal cysts, acquired, bilateral No date: RLS  (restless legs syndrome) No date: Sarcoidosis No date: Sickle cell trait (HCC) No date: Snoring No date: Spinal stenosis No date: Trigeminal neuralgia  Past Surgical History: No date: BARIATRIC SURGERY No date: CESAREAN SECTION     Comment:  3 or more 03/15/2019: COLONOSCOPY WITH PROPOFOL; N/A     Comment:  Procedure: COLONOSCOPY WITH PROPOFOL;  Surgeon: [name], MD;  Location: [facility] ENDOSCOPY;  Service:               Gastroenterology;  Laterality: N/A; No date: GASTRIC BYPASS 10/18/2020: KNEE ARTHROPLASTY; Right     Comment:  Procedure: COMPUTER ASSISTED TOTAL KNEE ARTHROPLASTY;                Surgeon: Dereck Leep, MD;  Location: [facility] ORS;                Service: Orthopedics;  Laterality: Right; No date: SHOULDER ARTHROSCOPY; Right No date: TUBAL LIGATION  BMI    Body Mass Index: 28.48 kg/m      Reproductive/Obstetrics negative OB ROS                             Anesthesia Physical Anesthesia Plan  ASA: 2  Anesthesia Plan: General   Post-op Pain Management:    Induction: Intravenous  PONV Risk Score and Plan: Propofol infusion and TIVA  Airway Management Planned: Natural Airway  Additional Equipment:   Intra-op Plan:   Post-operative Plan:   Informed Consent: I have reviewed the patients History and Physical, chart, labs and discussed the procedure including the risks, benefits and alternatives for the proposed  anesthesia with the patient or authorized representative who has indicated his/her understanding and acceptance.     Dental Advisory Given  Plan Discussed with: CRNA and Surgeon  Anesthesia Plan Comments:         Anesthesia Quick Evaluation

## 2022-03-15 NOTE — Anesthesia Postprocedure Evaluation (Signed)
Anesthesia Post Note  Patient: Melissa [name]  Procedure(s) Performed: COLONOSCOPY WITH PROPOFOL  Patient location during evaluation: PACU Anesthesia Type: General Level of consciousness: awake and awake and alert Pain management: pain level controlled Vital Signs Assessment: post-procedure vital signs reviewed and stable Respiratory status: nonlabored ventilation Cardiovascular status: stable Anesthetic complications: no   No notable events documented.   Last Vitals:  Vitals:   03/15/22 1131 03/15/22 1139  BP: 118/82   Pulse: 78   Resp: 17   Temp:  (!) 35.8 C  SpO2: 100% 100%    Last Pain:  Vitals:   03/15/22 1139  TempSrc: Temporal  PainSc: 0-No pain                 VAN STAVEREN,Tamesha Ellerbrock

## 2022-03-15 NOTE — Op Note (Signed)
[facility] Gastroenterology Patient Name: Melissa [name] Procedure Date: 03/15/2022 10:49 AM MRN: [id number] Account #: [id number] Date of Birth: August 02, 1951 Admit Type: Outpatient Age: 71 Room: [facility] ENDO ROOM 1 Gender: Female Note Status: Finalized Instrument Name: Jasper Riling [id number] Procedure:             Colonoscopy Indications:           Surveillance: Personal history of adenomatous polyps                         on last colonoscopy 3 years ago, Last colonoscopy:                         July 2020 Providers:             [name] MD, MD Medicines:             General Anesthesia Complications:         No immediate complications. Estimated blood loss: None. Procedure:             Pre-Anesthesia Assessment:                        - Prior to the procedure, a History and Physical was                         performed, and patient medications and allergies were                         reviewed. The patient is competent. The risks and                         benefits of the procedure and the sedation options and                         risks were discussed with the patient. All questions                         were answered and informed consent was obtained.                         Patient identification and proposed procedure were                         verified by the physician, the nurse, the                         anesthesiologist, the anesthetist and the technician                         in the pre-procedure area in the procedure room in the                         endoscopy suite. Mental Status Examination: alert and                         oriented. Airway Examination: normal oropharyngeal                         airway and neck mobility.  Respiratory Examination:                         clear to auscultation. CV Examination: normal.                         Prophylactic Antibiotics: The patient does not require                         prophylactic  antibiotics. Prior Anticoagulants: The                         patient has taken no previous anticoagulant or                         antiplatelet agents. ASA Grade Assessment: II - A                         patient with mild systemic disease. After reviewing                         the risks and benefits, the patient was deemed in                         satisfactory condition to undergo the procedure. The                         anesthesia plan was to use general anesthesia.                         Immediately prior to administration of medications,                         the patient was re-assessed for adequacy to receive                         sedatives. The heart rate, respiratory rate, oxygen                         saturations, blood pressure, adequacy of pulmonary                         ventilation, and response to care were monitored                         throughout the procedure. The physical status of the                         patient was re-assessed after the procedure.                        After obtaining informed consent, the colonoscope was                         passed under direct vision. Throughout the procedure,                         the patient's blood pressure, pulse, and oxygen  saturations were monitored continuously. The                         Colonoscope was introduced through the anus and                         advanced to the the cecum, identified by appendiceal                         orifice and ileocecal valve. The colonoscopy was                         performed with moderate difficulty due to significant                         looping and the patient's body habitus. Successful                         completion of the procedure was aided by applying                         abdominal pressure. The patient tolerated the                         procedure well. The quality of the bowel preparation                         was  evaluated using the [facility] Bowel Preparation                         Scale) with scores of: Right Colon = 3, Transverse                         Colon = 3 and Left Colon = 3 (entire mucosa seen well                         with no residual staining, small fragments of stool or                         opaque liquid). The total BBPS score equals 9. Findings:      The perianal and digital rectal examinations were normal. Pertinent       negatives include normal sphincter tone and no palpable rectal lesions.      Two sessile polyps were found in the descending colon and cecum. The       polyps were 3 to 4 mm in size. These polyps were removed with a cold       snare. Resection and retrieval were complete. Estimated blood loss: none.      A 20 mm polyp was found in the transverse colon. The polyp was sessile.       The polyp was removed with a hot snare. Resection and retrieval were       complete. Estimated blood loss: none.      The retroflexed view of the distal rectum and anal verge was normal and       showed no anal or rectal abnormalities.      Multiple diverticula were found in the entire colon. Impression:            -  Two 3 to 4 mm polyps in the descending colon and in                         the cecum, removed with a cold snare. Resected and                         retrieved.                        - One 20 mm polyp in the transverse colon, removed                         with a hot snare. Resected and retrieved.                        - The distal rectum and anal verge are normal on                         retroflexion view.                        - Diverticulosis in the entire examined colon. Recommendation:        - Discharge patient to home (with escort).                        - Resume previous diet today.                        - Continue present medications.                        - Await pathology results.                        - Repeat colonoscopy in 3 years for  surveillance. Procedure Code(s):     --- Professional ---                        [phone], Colonoscopy, flexible; with removal of                         tumor(s), polyp(s), or other lesion(s) by snare                         technique Diagnosis Code(s):     --- Professional ---                        K63.5, Polyp of colon                        Z86.010, Personal history of colonic polyps                        K57.30, Diverticulosis of large intestine without                         perforation or abscess without bleeding CPT copyright 2019 American Medical Association. All rights reserved. The codes documented in this report are preliminary and upon coder review may  be revised to meet current compliance requirements. Dr.  Ronini Boniface Goffe Raeanne Gathers MD, MD 03/15/2022 11:33:14 AM This report has been signed electronically. Number of Addenda: 0 Note Initiated On: 03/15/2022 10:49 AM Scope Withdrawal Time: 0 hours 22 minutes 18 seconds  Total Procedure Duration: 0 hours 27 minutes 52 seconds  Estimated Blood Loss:  Estimated blood loss: none.      [facility]

## 2022-03-15 NOTE — Transfer of Care (Signed)
Immediate Anesthesia Transfer of Care Note  Patient: Melissa [name]  Procedure(s) Performed: COLONOSCOPY WITH PROPOFOL  Patient Location: PACU and Endoscopy Unit  Anesthesia Type:MAC  Level of Consciousness: drowsy  Airway & Oxygen Therapy: Patient Spontanous Breathing  Post-op Assessment: Report given to RN and Post -op Vital signs reviewed and stable  Post vital signs: Reviewed and stable  Last Vitals:  Vitals Value Taken Time  BP 118/82 03/15/22 1132  Temp    Pulse 75 03/15/22 1133  Resp 9 03/15/22 1133  SpO2 100 % 03/15/22 1133  Vitals shown include unvalidated device data.  Last Pain:  Vitals:   03/15/22 0850  TempSrc: Temporal  PainSc: 0-No pain         Complications: No notable events documented.

## 2022-03-15 NOTE — H&P (Signed)
Cephas Darby, MD [address]  [place]  [place], [place] [address]  Main: [phone]  Fax: [phone] Pager: [phone]  Primary Care Physician:  Steele Sizer, MD Primary Gastroenterologist:  Dr. Cephas Darby  Pre-Procedure History & Physical: HPI:  Melissa [name] is a 71 y.o. female is here for an colonoscopy.   Past Medical History:  Diagnosis Date   Aortic atherosclerosis ([place])    Asthma    Bruising    Cardiomyopathy ([place])    Celiac artery aneurysm (HCC)    Cervical radiculopathy    CKD (chronic kidney disease), stage III (HCC)    Deficiency of vitamin B    Depression    Edema    Flank pain    Gross hematuria    History of 2019 novel coronavirus disease (COVID-19) 05/01/2019   History of bariatric surgery    HLD (hyperlipidemia)    HTN (hypertension)    Interstitial lung disease (HCC)    Iron deficiency anemia    Leukopenia    OA (osteoarthritis)    Obesity    OSA on CPAP    Osteopenia    Perennial allergic rhinitis    Pneumonia due to COVID-19 virus 2020   Renal cysts, acquired, bilateral    RLS (restless legs syndrome)    Sarcoidosis    Sickle cell trait (HCC)    Snoring    Spinal stenosis    Trigeminal neuralgia     Past Surgical History:  Procedure Laterality Date   BARIATRIC SURGERY     CESAREAN SECTION     3 or more   COLONOSCOPY WITH PROPOFOL N/A 03/15/2019   Procedure: COLONOSCOPY WITH PROPOFOL;  Surgeon: [name], MD;  Location: [facility] ENDOSCOPY;  Service: Gastroenterology;  Laterality: N/A;   GASTRIC BYPASS     KNEE ARTHROPLASTY Right 10/18/2020   Procedure: COMPUTER ASSISTED TOTAL KNEE ARTHROPLASTY;  Surgeon: Dereck Leep, MD;  Location: [facility] ORS;  Service: Orthopedics;  Laterality: Right;   SHOULDER ARTHROSCOPY Right    TUBAL LIGATION      Prior to Admission medications   Medication Sig Start Date End Date Taking? Authorizing Provider  Ascorbic Acid (VITAMIN C) 1000 MG tablet Take 1,000 mg by mouth daily.    Yes [provider]  aspirin EC 81 MG tablet Take 1 tablet by mouth daily. 08/27/18  Yes [provider]  atorvastatin (LIPITOR) 40 MG tablet Take 1 tablet (40 mg total) by mouth daily. 02/08/22  Yes Sowles, Drue Stager, MD  azelastine (ASTELIN) 0.1 % nasal spray Place 2 sprays into both nostrils 2 (two) times daily. Use in each nostril as directed 12/07/21  Yes Magdalen Spatz, NP  losartan (COZAAR) 25 MG tablet Take 1 tablet (25 mg total) by mouth daily. 02/08/22  Yes Sowles, Drue Stager, MD  magnesium oxide (MAG-OX) 400 MG tablet Take 400 mg by mouth daily.   Yes [provider]  Multiple Vitamins-Minerals (CENTRUM SILVER) tablet Take 1 tablet by mouth daily. 07/02/18  Yes Steele Sizer, MD  Probiotic Product (FORTIFY PROBIOTIC WOMENS EX ST PO) Take by mouth daily.   Yes [provider]  zinc gluconate 50 MG tablet Take 50 mg by mouth daily.   Yes [provider]  acetaminophen (TYLENOL) 650 MG CR tablet Take 1,300 mg by mouth every 8 (eight) hours as needed for pain.    [provider]  albuterol (VENTOLIN HFA) 108 (90 Base) MCG/ACT inhaler TAKE 2 PUFFS BY MOUTH EVERY 6 HOURS AS NEEDED  FOR WHEEZE OR SHORTNESS OF BREATH 10/17/21   Magdalen Spatz, NP  Budeson-Glycopyrrol-Formoterol (BREZTRI AEROSPHERE) 160-9-4.8 MCG/ACT AERO Inhale 2 puffs into the lungs in the morning and at bedtime. 01/16/22   Flora Lipps, MD  budesonide (PULMICORT) 0.5 MG/2ML nebulizer solution Take 2 mLs (0.5 mg total) by nebulization in the morning and at bedtime. 12/07/21   Magdalen Spatz, NP  calcium carbonate (TUMS - DOSED IN MG ELEMENTAL CALCIUM) 500 MG chewable tablet Chew 500-1,000 mg by mouth daily as needed for indigestion or heartburn.    [provider]  ezetimibe (ZETIA) 10 MG tablet Take 1 tablet (10 mg total) by mouth daily. 02/01/22   Minna Merritts, MD  ferrous sulfate 325 (65 FE) MG tablet Take 325 mg by mouth daily with breakfast.    [provider]   fluticasone (FLONASE) 50 MCG/ACT nasal spray Place 2 sprays into both nostrils 2 (two) times daily.    [provider]  ipratropium-albuterol (DUONEB) 0.5-2.5 (3) MG/3ML SOLN Take 3 mLs by nebulization every 6 (six) hours as needed. 12/07/21   Magdalen Spatz, NP  levocetirizine Harlow Ohms ALLERGY 24HR) 5 MG tablet Take 1 tablet (5 mg total) by mouth every evening. 01/16/22   Flora Lipps, MD  metaxalone (SKELAXIN) 800 MG tablet Take 1 tablet (800 mg total) by mouth 3 (three) times daily as needed for muscle spasms. 02/08/22   Steele Sizer, MD    Allergies as of 02/11/2022 - Review Complete 02/11/2022  Allergen Reaction Noted   Contrast media [iodinated contrast media]  04/17/2015   Shellfish allergy  04/17/2015    Family History  Problem Relation Age of Onset   Hypercholesterolemia Mother    Heart disease Mother    Hypertension Mother    Alcohol abuse Father    Lung cancer Brother    Alcohol abuse Brother    Diabetes Mellitus II Sister    Hypertension Maternal Grandmother    Colon cancer Sister    Breast cancer Neg Hx    Prostate cancer Neg Hx    Bladder Cancer Neg Hx    Kidney cancer Neg Hx     Social History   Socioeconomic History   Marital status: Widowed    Spouse name: Marah Park   Number of children: 4   Years of education: Not on file   Highest education level: Some college, no degree  Occupational History    Comment: retired  Tobacco Use   Smoking status: Never   Smokeless tobacco: Never   Tobacco comments:    Second hand smoke exposure  Vaping Use   Vaping Use: Never used  Substance and Sexual Activity   Alcohol use: Not Currently    Alcohol/week: 0.0 standard drinks of alcohol   Drug use: No   Sexual activity: Yes    Partners: Male    Birth control/protection: Post-menopausal  Other Topics Concern   Not on file  Social History Narrative   Pt's son lives with her   Social Determinants of Health   Financial Resource Strain: [name]   (10/23/2021)   Overall Financial Resource Strain (CARDIA)    Difficulty of Paying Living Expenses: Not hard at all  Food Insecurity: No Food Insecurity (10/23/2021)   Hunger Vital Sign    Worried About Running Out of Food in the Last Year: Never true    [place] in the Last Year: Never true  Transportation Needs: No Transportation Needs (10/23/2021)   [place] - Transportation  Lack of Transportation (Medical): No    Lack of Transportation (Non-Medical): No  Physical Activity: Sufficiently Active (10/23/2021)   Exercise Vital Sign    Days of Exercise per Week: 4 days    Minutes of Exercise per Session: 90 min  Stress: No Stress Concern Present (10/23/2021)   [place]    Feeling of Stress : Not at all  Social Connections: Socially Isolated (10/23/2021)   Social Connection and Isolation Panel [NHANES]    Frequency of Communication with Friends and Family: More than three times a week    Frequency of Social Gatherings with Friends and Family: More than three times a week    Attends Religious Services: Never    Marine scientist or Organizations: No    Attends Archivist Meetings: Never    Marital Status: Widowed  Intimate Partner Violence: Not At [name] (10/23/2021)   Humiliation, Afraid, Rape, and Kick questionnaire    Fear of Current or Ex-Partner: No    Emotionally Abused: No    Physically Abused: No    Sexually Abused: No    Review of Systems: See HPI, otherwise negative ROS  Physical Exam: BP (!) 139/94   Pulse 77   Temp (!) 96.8 F (36 C) (Temporal)   Resp 20   Ht 6' (1.829 m)   Wt 95.3 kg   SpO2 100%   BMI 28.48 kg/m  General:   Alert,  pleasant and cooperative in NAD Head:  Normocephalic and atraumatic. Neck:  Supple; no masses or thyromegaly. Lungs:  Clear throughout to auscultation.    Heart:  Regular rate and rhythm. Abdomen:  Soft, nontender and nondistended. Normal bowel sounds,  without guarding, and without rebound.   Neurologic:  Alert and  oriented x4;  grossly normal neurologically.  Impression/Plan: Melissa [name] is here for an colonoscopy to be performed for h/o colon adenomas  Risks, benefits, limitations, and alternatives regarding  colonoscopy have been reviewed with the patient.  Questions have been answered.  All parties agreeable.   Sherri Sear, MD  03/15/2022, 9:19 AM

## 2022-03-18 ENCOUNTER — Encounter: Payer: Self-pay | Admitting: Gastroenterology

## 2022-03-18 LAB — SURGICAL PATHOLOGY

## 2022-03-22 ENCOUNTER — Other Ambulatory Visit: Payer: Self-pay | Admitting: Internal Medicine

## 2022-03-22 DIAGNOSIS — J4521 Mild intermittent asthma with (acute) exacerbation: Secondary | ICD-10-CM

## 2022-04-08 ENCOUNTER — Ambulatory Visit: Payer: Medicare Other

## 2022-04-09 NOTE — Progress Notes (Unsigned)
Name: Melissa [name]   MRN: [id number]    DOB: 02/20/1951   Date:04/10/2022       Progress Note  Subjective  Chief Complaint  Follow Up  HPI  Atherosclerosis aorta and CAD/Celiac artery aneurysm : found on CT chest done 2021. She has been on statin therapy and aspirin, sees cardiologist , Dr. Rockey Situ yearly. Last CT renal stone was 06/22 and size of aneurysm 15 mm and stable, monitor every 2 -3 years. Repeat in 2024   OSA : she sees pulmonologist, wears CPAP every night. , under the care of Dr. Mortimer Fries    Sarcoidosis/Asthma/OSA and chronic bronchitis: no longer seeing f Dr. Regan Lemming at [facility], currently on [facility] and albuterol prn, and back on Dr. Mortimer Fries   . She has been evaluated by Rheumatologist and negative for sarcoidosis outside of lung. She has intermittent cough and will go back for repeat CT next week.   Last CT 10/04/2019  IMPRESSION:  Pulmonary parenchymal findings as above with associated calcified mediastinal/hilar adenopathy compatible with sequelae of known pulmonary sarcoidosis.   Mildly dilated main pulmonary artery suggesting elevated pulmonary arterial pressures.   CKI stage III: history of secondary hyperparathyroidism, under the care of nephrologist, last urine micro normal , last GFR was 35 back in June 2023. No pruritis or decrease in urine output    Sickle cell trait: asymptomatic. Unchanged    Depression Major Depression: she took medications for a while, it was present for a few years, she is in remission.  She sees her therapist every 2 weeks.    Cervical radiculitis: she used to get injections by Dr. Phyllis Ginger, she states lately noticing pain on right side of neck that radiates to right upper arm. She denies weakness  Muscles spasms: she states symptoms seems to be getting worse, toes , feet and has to get up to walk, it also affects her hands and abdominal wall. She takes skelaxin prn . She has an appointment with Dr. Melrose Nakayama. She states liquid IV seems to help.    Patient Active Problem List   Diagnosis Date Noted   Colon cancer screening    Polyp of transverse colon    Hematuria 01/03/2021   Total knee replacement status 10/18/2020   Primary osteoarthritis of right hip 08/06/2020   Sarcoidosis of lung ([place]) 05/29/2020   Aneurysm of celiac artery ([place]) 02/11/2020   COVID-19 virus infection 09/06/2019   Localized swelling of right lower leg 09/06/2019   OSA on CPAP 09/06/2019   Secondary hyperparathyroidism of renal origin ([place]) 07/21/2019   Anemia in chronic kidney disease 06/14/2019   Benign hypertensive kidney disease with chronic kidney disease 06/14/2019   Sickle cell trait ([place]) 06/14/2019   Stage 3b chronic kidney disease ([place]) 06/14/2019   Chronic bronchitis ([place]) 05/19/2019   Chronic venous insufficiency 04/15/2019   Lymphedema 04/15/2019   Family history of colon cancer    Trigeminal neuralgia 09/11/2018   Cardiac LV ejection fraction of 40-49% 05/07/2017   Low back pain without sciatica 02/21/2017   Renal cyst 10/18/2016   Atherosclerosis of aorta ([place]) 10/16/2016   History of colonic polyps 09/25/2016   Dyslipidemia 12/08/2015   Gastroesophageal reflux disease without esophagitis 08/11/2015   Primary osteoarthritis of knee 06/13/2015   Hip bursitis 06/13/2015   RLS (restless legs syndrome) 06/13/2015   Depression, major, in remission ([place]) 06/13/2015   B12 deficiency 06/13/2015   History of Roux-en-Y gastric bypass 06/13/2015   History of iron deficiency anemia 06/13/2015   Vitamin D  deficiency 06/13/2015   Hypertension, benign 06/13/2015   Perennial allergic rhinitis with seasonal variation 06/13/2015   Primary osteoarthritis of both knees 06/13/2015   Cervical radiculitis 01/04/2014   Cervical spinal stenosis 01/04/2014   Cervical osteoarthritis 01/04/2014    Past Surgical History:  Procedure Laterality Date   BARIATRIC SURGERY     CESAREAN SECTION     3 or more   COLONOSCOPY WITH PROPOFOL N/A 03/15/2019    Procedure: COLONOSCOPY WITH PROPOFOL;  Surgeon: [name], MD;  Location: [facility];  Service: Gastroenterology;  Laterality: N/A;   COLONOSCOPY WITH PROPOFOL N/A 03/15/2022   Procedure: COLONOSCOPY WITH PROPOFOL;  Surgeon: [name], MD;  [facility] END[facility]COPY;  Service: Gastroenterology;  Laterality: N/A;   GASTRIC BYPASS     KNEE ARTHROPLASTY Right 10/18/2020   Procedure: COMPUTER ASSISTED TOTAL KNEE ARTHROPLASTY;  Surgeon: Dereck Leep, MD;  Location: [facility] ORS;  Service: Orthopedics;  Laterality: Right;   SHOULDER ARTHROSCOPY Right    TUBAL LIGATION      Family History  Problem Relation Age of Onset   Hypercholesterolemia Mother    Heart disease Mother    Hypertension Mother    Alcohol abuse Father    Lung cancer Brother    Alcohol abuse Brother    Diabetes Mellitus II Sister    Hypertension Maternal Grandmother    Colon cancer Sister    Breast cancer Neg Hx    Prostate cancer Neg Hx    Bladder Cancer Neg Hx    Kidney cancer Neg Hx     Social History   Tobacco Use   Smoking status: Never   Smokeless tobacco: Never   Tobacco comments:    Second hand smoke exposure  Substance Use Topics   Alcohol use: Not Currently    Alcohol/week: 0.0 standard drinks of alcohol     Current Outpatient Medications:    acetaminophen (TYLENOL) 650 MG CR tablet, Take 1,300 mg by mouth every 8 (eight) hours as needed for pain., Disp: , Rfl:    albuterol (VENTOLIN HFA) 108 (90 Base) MCG/ACT inhaler, TAKE 2 PUFFS BY MOUTH EVERY 6 HOURS AS NEEDED FOR WHEEZE OR SHORTNESS OF BREATH, Disp: 18 each, Rfl: 0   Ascorbic Acid (VITAMIN C) 1000 MG tablet, Take 1,000 mg by mouth daily., Disp: , Rfl:    aspirin EC 81 MG tablet, Take 1 tablet by mouth daily., Disp: , Rfl:    atorvastatin (LIPITOR) 40 MG tablet, Take 1 tablet (40 mg total) by mouth daily., Disp: 90 tablet, Rfl: 1   azelastine (ASTELIN) 0.1 % nasal spray, Place 2 sprays into both nostrils 2 (two) times daily. Use  in each nostril as directed, Disp: 30 mL, Rfl: 12   Budeson-Glycopyrrol-Formoterol (BREZTRI AEROSPHERE) 160-9-4.8 MCG/ACT AERO, Inhale 2 puffs into the lungs in the morning and at bedtime., Disp: 1 each, Rfl: 6   budesonide (PULMICORT) 0.5 MG/2ML nebulizer solution, Take 2 mLs (0.5 mg total) by nebulization in the morning and at bedtime., Disp: 120 mL, Rfl: 6   calcium carbonate (TUMS - DOSED IN MG ELEMENTAL CALCIUM) 500 MG chewable tablet, Chew 500-1,000 mg by mouth daily as needed for indigestion or heartburn., Disp: , Rfl:    ezetimibe (ZETIA) 10 MG tablet, Take 1 tablet (10 mg total) by mouth daily., Disp: 90 tablet, Rfl: 3   ferrous sulfate 325 (65 FE) MG tablet, Take 325 mg by mouth daily with breakfast., Disp: , Rfl:    fluticasone (FLONASE) 50 MCG/ACT nasal spray, Place  2 sprays into both nostrils 2 (two) times daily., Disp: , Rfl:    ipratropium-albuterol (DUONEB) 0.5-2.5 (3) MG/3ML SOLN, Take 3 mLs by nebulization every 6 (six) hours as needed., Disp: 360 mL, Rfl: 3   levocetirizine (XYZAL ALLERGY 24HR) 5 MG tablet, Take 1 tablet (5 mg total) by mouth every evening., Disp: 30 tablet, Rfl: 6   losartan (COZAAR) 25 MG tablet, Take 1 tablet (25 mg total) by mouth daily., Disp: 90 tablet, Rfl: 1   magnesium oxide (MAG-OX) 400 MG tablet, Take 400 mg by mouth daily., Disp: , Rfl:    metaxalone (SKELAXIN) 800 MG tablet, Take 1 tablet (800 mg total) by mouth 3 (three) times daily as needed for muscle spasms., Disp: 90 tablet, Rfl: 0   Multiple Vitamins-Minerals (CENTRUM SILVER) tablet, Take 1 tablet by mouth daily., Disp: 30 tablet, Rfl: 0   Probiotic Product (FORTIFY PROBIOTIC WOMENS EX ST PO), Take by mouth daily., Disp: , Rfl:    zinc gluconate 50 MG tablet, Take 50 mg by mouth daily., Disp: , Rfl:   Allergies  Allergen Reactions   Contrast Media [Iodinated Contrast Media]     Unknown reaction   Shellfish Allergy     Edema    I personally reviewed active problem list, medication list,  allergies, family history, social history, health maintenance with the patient/caregiver today.   ROS  Constitutional: Negative for fever or weight change.  Respiratory: Negative for cough and shortness of breath.   Cardiovascular: Negative for chest pain or palpitations.  Gastrointestinal: Negative for abdominal pain, no bowel changes.  Musculoskeletal: Negative for gait problem or joint swelling.  Skin: Negative for rash.  Neurological: Negative for dizziness or headache.  No other specific complaints in a complete review of systems (except as listed in HPI above).   Objective  Vitals:   04/10/22 0932  BP: 120/64  Pulse: 91  Resp: 16  SpO2: 100%  Weight: 208 lb (94.3 kg)  Height: 6' (1.829 m)    Body mass index is 28.21 kg/m.  Physical Exam  Constitutional: Patient appears well-developed and well-nourished.  No distress.  HEENT: head atraumatic, normocephalic, pupils equal and reactive to light, neck supple, throat within normal limits Cardiovascular: Normal rate, regular rhythm and normal heart sounds.  No murmur heard. No BLE edema. Pulmonary/Chest: Effort normal and breath sounds normal. No respiratory distress. Abdominal: Soft.  There is no tenderness. Psychiatric: Patient has a normal mood and affect. behavior is normal. Judgment and thought content normal.   Recent Results (from the past 2160 hour(s))  Surgical pathology     Status: None   Collection Time: 03/15/22 11:08 AM  Result Value Ref Range   SURGICAL PATHOLOGY      SURGICAL PATHOLOGY CASE: [id number] PATIENT: Darlyne Russian Surgical Pathology Report     Specimen Submitted: A. Colon polyp, cecum; cold snare B. Colon polyp, transverse; hot snare C. Colon polyp, descending; cold snare  Clinical History: Colon cancer screening Z12.11.  Colon polyps, diverticulosis      DIAGNOSIS: A.  COLON POLYP, CECUM; COLD SNARE: - TUBULAR ADENOMA. - NEGATIVE FOR HIGH-GRADE DYSPLASIA AND  MALIGNANCY.  B.  COLON POLYP, TRANSVERSE; HOT SNARE: - TUBULAR ADENOMA. - NEGATIVE FOR HIGH-GRADE DYSPLASIA AND MALIGNANCY.  C.  COLON POLYP, DESCENDING; COLD SNARE: - TUBULAR ADENOMA. - NEGATIVE FOR HIGH-GRADE DYSPLASIA AND MALIGNANCY.  GROSS DESCRIPTION: A. Labeled: Cecum polyp cold snare Received: Formalin Collection time: 11:08 AM on 03/15/2022 Placed into formalin time: 11:08 AM on 03/15/2022 Tissue fragment(s): Multiple  Size: Aggregate, 1.5 x 0.3 x 0.2 cm Description: Tan soft tissue fragments Entirely submitted in 1 cassette.  B. L abeled: Transverse colon polyp hot snare Received: Formalin Collection time: 11:13 AM on 03/15/2022 Placed into formalin time: 11:13 AM on 03/15/2022 Tissue fragment(s): Multiple Size: Aggregate, 1.2 x 1 x 0.7 cm Description: Received are fragments of tan soft tissue admixed with intestinal debris.  The ratio of soft tissue to intestinal debris is 90: 10.  The larger soft tissue fragment has a resection margin which is inked green.  This fragment is serially sectioned. Entirely submitted in cassettes 1-2 with the serially sectioned fragment in cassette 1 and the remaining fragments in cassette 2.  C. Labeled: Descending colon polyp cold snare Received: Formalin Collection time: 11:24 AM on 03/15/2022 Placed into formalin time: 11:24 AM on 03/15/2022 Tissue fragment(s): 2 Size: Range from 0.3-1 cm Description: Received is a single fragment of tan soft tissue and a fragment of intestinal debris.  The ratio of soft tissue to intestinal debris is 90: 10.  The soft tissue f ragment has a resection margin which is inked blue.  This fragment is serially sectioned. Entirely submitted in 1 cassette.  RB 03/15/2022  Final Diagnosis performed by Quay Burow, MD.   Electronically signed 03/18/2022 10:18:38AM The electronic signature indicates that the named Attending Pathologist has evaluated the specimen Technical component performed at [facility],  [address], [place], [place] [address] Lab: [phone] Dir: Rush Farmer, MD, MMM  Professional component performed at [facility], [facility], [place], [place], [place] [address] Lab: [phone] Dir: Kathi Simpers, MD     PHQ2/9:    04/10/2022    9:32 AM 02/08/2022   10:20 AM 10/23/2021    1:46 PM 07/25/2021    1:16 PM 04/10/2021   10:06 AM  Depression screen PHQ 2/9  Decreased Interest 0 0 0 0 0  Down, Depressed, Hopeless 0 0 0 0 0  PHQ - 2 Score 0 0 0 0 0  Altered sleeping 1 0 3 0   Tired, decreased energy 0 1 0 0   Change in appetite 0 0 0 0   Feeling bad or failure about yourself  0 0 0 0   Trouble concentrating 0 0 0 0   Moving slowly or fidgety/restless 0 0 0 0   Suicidal thoughts 0 0 0 0   PHQ-9 Score '1 1 3 '$ 0   Difficult doing work/chores   Not difficult at all      phq 9 is negative   Fall Risk:    04/10/2022    9:31 AM 02/08/2022   10:19 AM 10/23/2021    1:56 PM 07/25/2021    1:16 PM 04/10/2021   10:06 AM  Fall Risk   Falls in the past year? 0 1 0 0 0  Number falls in past yr: 0 0 0 0 0  Injury with Fall? 0 0 0 0 0  Risk for fall due to : No Fall Risks No Fall Risks No Fall Risks No Fall Risks No Fall Risks  Follow up Falls prevention discussed Falls prevention discussed Falls prevention discussed Falls prevention discussed Falls prevention discussed      Functional Status Survey: Is the patient deaf or have difficulty hearing?: No Does the patient have difficulty seeing, even when wearing glasses/contacts?: No Does the patient have difficulty concentrating, remembering, or making decisions?: No Does the patient have difficulty walking or climbing stairs?: No Does the patient have difficulty  dressing or bathing?: No Does the patient have difficulty doing errands alone such as visiting a doctor's office or shopping?: No    Assessment & Plan  1. Aneurysm of celiac artery (HCC)  Continue statin and zetia  2. Sarcoidosis of  lung ([place])  Stable  3. Atherosclerosis of aorta (HCC)  - Lipid panel - COMPLETE METABOLIC PANEL WITH GFR  4. Chronic kidney disease, stage 3b ([place])  Keep follow up with kidney function   5. Depression, major, in remission ([place])  Doing well   6. OSA on CPAP   7. Secondary hyperparathyroidism of renal origin ([place])   8. Sickle cell trait ([place])   9. Muscle spasm   Keep follow up with neurologist

## 2022-04-10 ENCOUNTER — Ambulatory Visit (INDEPENDENT_AMBULATORY_CARE_PROVIDER_SITE_OTHER): Payer: Medicare Other | Admitting: Family Medicine

## 2022-04-10 ENCOUNTER — Encounter: Payer: Self-pay | Admitting: Family Medicine

## 2022-04-10 VITALS — BP 120/64 | HR 91 | Resp 16 | Ht 72.0 in | Wt 208.0 lb

## 2022-04-10 DIAGNOSIS — I728 Aneurysm of other specified arteries: Secondary | ICD-10-CM

## 2022-04-10 DIAGNOSIS — N2581 Secondary hyperparathyroidism of renal origin: Secondary | ICD-10-CM | POA: Diagnosis not present

## 2022-04-10 DIAGNOSIS — Z9989 Dependence on other enabling machines and devices: Secondary | ICD-10-CM

## 2022-04-10 DIAGNOSIS — D860 Sarcoidosis of lung: Secondary | ICD-10-CM

## 2022-04-10 DIAGNOSIS — I700 Atherosclerosis of aorta: Secondary | ICD-10-CM

## 2022-04-10 DIAGNOSIS — G4733 Obstructive sleep apnea (adult) (pediatric): Secondary | ICD-10-CM

## 2022-04-10 DIAGNOSIS — F325 Major depressive disorder, single episode, in full remission: Secondary | ICD-10-CM

## 2022-04-10 DIAGNOSIS — M62838 Other muscle spasm: Secondary | ICD-10-CM

## 2022-04-10 DIAGNOSIS — D573 Sickle-cell trait: Secondary | ICD-10-CM | POA: Diagnosis not present

## 2022-04-10 DIAGNOSIS — N1832 Chronic kidney disease, stage 3b: Secondary | ICD-10-CM

## 2022-04-10 DIAGNOSIS — F411 Generalized anxiety disorder: Secondary | ICD-10-CM | POA: Diagnosis not present

## 2022-04-11 LAB — COMPLETE METABOLIC PANEL WITH GFR
AG Ratio: 1.4 (calc) (ref 1.0–2.5)
ALT: 24 U/L (ref 6–29)
AST: 38 U/L — ABNORMAL HIGH (ref 10–35)
Albumin: 4.1 g/dL (ref 3.6–5.1)
Alkaline phosphatase (APISO): 60 U/L (ref 37–153)
BUN/Creatinine Ratio: 14 (calc) (ref 6–22)
BUN: 21 mg/dL (ref 7–25)
CO2: 20 mmol/L (ref 20–32)
Calcium: 9.5 mg/dL (ref 8.6–10.4)
Chloride: 111 mmol/L — ABNORMAL HIGH (ref 98–110)
Creat: 1.5 mg/dL — ABNORMAL HIGH (ref 0.50–1.05)
Globulin: 3 g/dL (calc) (ref 1.9–3.7)
Glucose, Bld: 91 mg/dL (ref 65–99)
Potassium: 4.8 mmol/L (ref 3.5–5.3)
Sodium: 143 mmol/L (ref 135–146)
Total Bilirubin: 0.5 mg/dL (ref 0.2–1.2)
Total Protein: 7.1 g/dL (ref 6.1–8.1)
eGFR: 37 mL/min/{1.73_m2} — ABNORMAL LOW (ref >=60)

## 2022-04-11 LAB — LIPID PANEL
Cholesterol: 151 mg/dL (ref <200)
HDL: 82 mg/dL (ref >=50)
LDL Cholesterol (Calc): 57 mg/dL (calc)
Non-HDL Cholesterol (Calc): 69 mg/dL (calc) (ref <130)
Total CHOL/HDL Ratio: 1.8 (calc) (ref <5.0)
Triglycerides: 50 mg/dL (ref <150)

## 2022-04-15 ENCOUNTER — Ambulatory Visit
Admission: RE | Admit: 2022-04-15 | Discharge: 2022-04-15 | Disposition: A | Discharge status: Home / Self Care | Payer: Medicare Other | Source: Ambulatory Visit | Attending: Internal Medicine | Admitting: Internal Medicine

## 2022-04-15 DIAGNOSIS — R918 Other nonspecific abnormal finding of lung field: Secondary | ICD-10-CM | POA: Diagnosis not present

## 2022-04-15 DIAGNOSIS — J4530 Mild persistent asthma, uncomplicated: Secondary | ICD-10-CM | POA: Diagnosis not present

## 2022-04-15 DIAGNOSIS — J479 Bronchiectasis, uncomplicated: Secondary | ICD-10-CM | POA: Diagnosis not present

## 2022-04-15 DIAGNOSIS — D869 Sarcoidosis, unspecified: Secondary | ICD-10-CM | POA: Diagnosis not present

## 2022-04-17 DIAGNOSIS — E538 Deficiency of other specified B group vitamins: Secondary | ICD-10-CM | POA: Diagnosis not present

## 2022-04-17 DIAGNOSIS — M79605 Pain in left leg: Secondary | ICD-10-CM | POA: Diagnosis not present

## 2022-04-17 DIAGNOSIS — R5383 Other fatigue: Secondary | ICD-10-CM | POA: Diagnosis not present

## 2022-04-17 DIAGNOSIS — E559 Vitamin D deficiency, unspecified: Secondary | ICD-10-CM | POA: Diagnosis not present

## 2022-04-17 DIAGNOSIS — M79604 Pain in right leg: Secondary | ICD-10-CM | POA: Diagnosis not present

## 2022-04-17 DIAGNOSIS — E612 Magnesium deficiency: Secondary | ICD-10-CM | POA: Diagnosis not present

## 2022-04-17 DIAGNOSIS — G2581 Restless legs syndrome: Secondary | ICD-10-CM | POA: Diagnosis not present

## 2022-04-24 ENCOUNTER — Ambulatory Visit (INDEPENDENT_AMBULATORY_CARE_PROVIDER_SITE_OTHER): Payer: Medicare Other | Admitting: Internal Medicine

## 2022-04-24 ENCOUNTER — Encounter: Payer: Self-pay | Admitting: Internal Medicine

## 2022-04-24 VITALS — BP 120/74 | HR 75 | Temp 97.9°F | Ht 72.0 in | Wt 209.2 lb

## 2022-04-24 DIAGNOSIS — D869 Sarcoidosis, unspecified: Secondary | ICD-10-CM

## 2022-04-24 DIAGNOSIS — G4733 Obstructive sleep apnea (adult) (pediatric): Secondary | ICD-10-CM

## 2022-04-24 DIAGNOSIS — J4521 Mild intermittent asthma with (acute) exacerbation: Secondary | ICD-10-CM | POA: Diagnosis not present

## 2022-04-24 DIAGNOSIS — R9389 Abnormal findings on diagnostic imaging of other specified body structures: Secondary | ICD-10-CM

## 2022-04-24 MED ORDER — PREDNISONE 20 MG PO TABS: 20.0000 mg | ORAL_TABLET | Freq: Every day | ORAL | 1 refills | Status: DC

## 2022-04-24 MED ORDER — TRELEGY ELLIPTA 200-62.5-25 MCG/ACT IN AEPB: 1.0000 | INHALATION_SPRAY | Freq: Every day | RESPIRATORY_TRACT | 0 refills | Status: DC

## 2022-04-24 MED ORDER — TRELEGY ELLIPTA 200-62.5-25 MCG/ACT IN AEPB: 1.0000 | INHALATION_SPRAY | Freq: Every day | RESPIRATORY_TRACT | 5 refills | Status: DC

## 2022-04-24 NOTE — Patient Instructions (Addendum)
Switch back to Trelegy 200 Stop Breztri  Avoid sick contacts Avoid secondhand smoke  Continue CPAP as prescribed Excellent job! A+!!!  Prednisone 20 mg daily for 7 days

## 2022-04-24 NOTE — Progress Notes (Signed)
**CT chest 05/01/2019>> bilateral scattered infiltrates, predominantly upper lobe. **CPAP download 07/22/2018-08/20/2018>> usage greater than 4 hours 26/30 days.  Average usage on days used is 7 hours 9 minutes, pressure range 5-15.  Median pressure 8, 95th percentile pressure 11, maximum pressure 13.  Leaks are within normal limits.  Residual AHI is 4.4 overall this shows good compliance with CPAP with excellent control of obstructive sleep apnea. **HST 06/03/2018>> mild sleep apnea with AHI of 10. **chest x-ray7/6/18>>  hyperinflation consistent with emphysema. **CBC 02/21/17>> eosinophils equals 300. CPAP download August 2023 100% for days 90% for greater than 4 hours AHI reduced to 1.2  Chief complaint Follow-up sarcoid Follow-up reactive airways disease Follow-up OSA Follow-up abnormal CT chest   HPI Regarding sarcoid Patient with biopsy-proven sarcoidosis in the year 2000 Patient has had abnormal CT scan since 2020 Bilateral interstitial lung disease nodular opacities with mediastinal adenopathy with calcifications Previously seen and assessed by [facility] pulmonary Repeat CT chest August 2023 shows similar findings over the last several years Bilateral interstitial lung disease nodular opacities with cavitary lesions with mediastinal adenopathy with calcifications No significant changes over the last several years  Previous diagnosis of COVID in 2020 Patient was placed on Trelegy inhaler therapy for reactive airways disease and asthma Currently has acute bronchitis at this time related to environmental exposures humidity like to be seen in the car    August 2023 download Regarding obstructive sleep apnea Excellent compliance report at 97% for days 80% for greater than 4 hours auto CPAP 5-15 with AHI of 1.2   Pets: Dog Occupation: Retired Immunologist Exposures: No known exposures.  No mold, hot tub, Jacuzzi.  No feather pillows or comforters Smoking history: Never  smoker Travel history: From [place].  New to [place] in 2005 Relevant family history: Brother had lung cancer.  He was a smoker.      CT of the chest personally reviewed with the patient in detail from 2021  HRCT Chest 10/03/2021>> Ordered at [facility] by Pulmonology Specialist FINDINGS:  AIRWAYS, LUNGS, PLEURA: Clear central tracheobronchial tree.  No pleural effusion. Multifocal scattered peribronchial nodular consolidation bilaterally. Left apical alveolar airspace consolidation with architectural distortion. Minimal peripheral predominant reticulation and traction bronchiectasis in middle lobe, lingula and bilateral lower lobes.Mild air trapping at the lung bases. No honeycombing   MEDIASTINUM: Normal heart size.  No pericardial effusion.  Mildly dilated 3.2 cm main pulmonary artery. Numerous enlarged and calcified mediastinal and bilateral hilar lymph nodes.   IMAGED ABDOMEN: Partially imaged lobulated multicystic kidneys containing nonobstructing nephrolithiasis. Sequela of Roux-en-Y gastric bypass. Colonic diverticulosis.      CT chest August 2023 similar findings from February 2023  CXR 09/10/2021 Bilateral ill-defined nodular densities predominantly in the right upper lobe, likely chronic inflammatory process. No new consolidation. There is no pleural effusion pneumothorax. The cardiac silhouette is within normal limits. No acute osseous pathology. Degenerative changes of the spine.        Latest Ref Rng & Units 10/18/2020   12:01 PM 10/10/2020    1:59 PM 05/11/2020    8:36 AM  CBC  WBC 4.0 - 10.5 K/uL  5.3  5.7   Hemoglobin 12.0 - 15.0 g/dL 11.6  12.2  11.8   Hematocrit 36.0 - 46.0 % 34.0  36.8  35.8   Platelets 150 - 400 K/uL  234  221        Latest Ref Rng & Units 04/10/2022   10:14 AM 06/11/2021   10:25 AM  04/10/2021   11:05 AM  BMP  Glucose 65 - 99 mg/dL 91  82  86   BUN 7 - 25 mg/dL '21  20  31   '$ Creatinine 0.50 - 1.05 mg/dL 1.50  1.60  1.64   BUN/Creat  Ratio 6 - 22 (calc) '14  13  19   '$ Sodium 135 - 146 mmol/L 143  141  139   Potassium 3.5 - 5.3 mmol/L 4.8  4.8  4.5   Chloride 98 - 110 mmol/L 111  107  104   CO2 20 - 32 mmol/L '20  24  24   '$ Calcium 8.6 - 10.4 mg/dL 9.5  9.6  9.7     BNP No results found for: "BNP"  ProBNP No results found for: "PROBNP"  PFT    Component Value Date/Time   FEV1PRE 3.54 04/07/2020 1148   FEV1POST 3.55 04/07/2020 1148   FVCPRE 4.71 04/07/2020 1148   FVCPOST 4.61 04/07/2020 1148   TLC 7.48 04/07/2020 1148   DLCOUNC 20.30 04/07/2020 1148   PREFEV1FVCRT 75 04/07/2020 1148   PSTFEV1FVCRT 77 04/07/2020 1148    CT CHEST WO CONTRAST  Result Date: 04/15/2022 CLINICAL DATA:  F/U CT Chest High Res done at [facility] on 10/03/2021. She states she was diagnosed with Sarcoidosis and her pulmonologist wants to compare her lungs to the prior CT. She c/o lung discomfort in hot weather. NKI No hx CA. No hx thoracic surg. Never a smoker. EXAM: CT CHEST WITHOUT CONTRAST TECHNIQUE: Multidetector CT imaging of the chest was performed following the standard protocol without IV contrast. RADIATION DOSE REDUCTION: This exam was performed according to the departmental dose-optimization program which includes automated exposure control, adjustment of the mA and/or kV according to patient size and/or use of iterative reconstruction technique. COMPARISON:  Chest radiographs, 09/10/2021. Chest CTs, 04/12/2020, 10/27/2019 and 05/01/2019. FINDINGS: Cardiovascular: Heart is normal in size and configuration no pericardial effusion. Great vessels normal in caliber. Minor aortic atherosclerotic calcifications. Mediastinum/Nodes: No neck base, mediastinal or hilar masses. There are prominent and mildly enlarged lymph nodes, many with central dystrophic calcifications. Several reference measurements remained. Right paratracheal, ascus level node measures 1.4 cm in short axis. Subcarinal node measures 2.2 cm in short axis. Right suprahilar node measures  1.9 cm short axis. Left lateral hilar node measures 1.6 cm in short axis. These are all stable. Trachea and esophagus are unremarkable. Lungs/Pleura: There are patchy bilateral areas of peribronchovascular opacity and interstitial thickening, cylindrical and varicose bronchiectasis as well as peribronchovascular micro and macro nodularity, similar to prior CTs dating back to 2020. Distribution predominates in the mid to upper lungs. There are also lung base peripheral linear and reticular opacities associated with small areas of honeycombing, stable. Two nodules in left upper lobe have increased in size, larger, 8 x 6 mm, mean 7 mm, left upper lobe, image 73, series 3, previously measured 5 mm. 4 mm left upper lobe nodule, image 51, series 3, previously measured 3 mm. No pleural effusion or pneumothorax. Upper Abdomen: No acute findings or changes from the CT. Previous gastric bypass surg. Renal lesions, favored to be cysts, stones. Musculoskeletal: No fracture or acute finding. No bone lesion no chest wall mass. IMPRESSION: 1. Similar appearance compared to the prior CT's. 2. There are 2 small nodules in the left upper lobe that have mildly increased in size, but are still most likely inflammatory in etiology. However, follow-up is recommended ensure stability. Recommend follow-up unenhanced chest CT in 6-12 months. 3. Prominent and mildly  enlarged mediastinal and hilar lymph nodes with associated dystrophic calcifications. Lung findings as detailed above, with the overall combination of findings consistent with the provided history of sarcoidosis. Aortic Atherosclerosis (ICD10-I70.0). Electronically Signed   By: Lajean Manes M.D.   On: 04/15/2022 16:10     Review of Systems: Gen:  Denies  fever, sweats, chills weight loss  HEENT: Denies blurred vision, double vision, ear pain, eye pain, hearing loss, nose bleeds, sore throat Cardiac:  No dizziness, chest pain or heaviness, chest tightness,edema, No  JVD Resp:   No cough, -sputum production, -shortness of breath,-wheezing, -hemoptysis,  Other:  All other systems negative  BP 120/74 (BP Location: Left Arm, Cuff Size: Normal)   Pulse 75   Temp 97.9 F (36.6 C) (Temporal)   Ht 6' (1.829 m)   Wt 209 lb 3.2 oz (94.9 kg)   SpO2 96%   BMI 28.37 kg/m    Physical Examination:   General Appearance: No distress  EYES PERRLA, EOM intact.   NECK Supple, No JVD Pulmonary: normal breath sounds, No wheezing.  CardiovascularNormal S1,S2.  No m/r/g.   Abdomen: Benign, Soft, non-tender. ALL OTHER ROS ARE NEGATIVE    ALL OTHER ROS ARE NEGATIVE    Assessment/Plan    71 year old pleasant African-American female with a diagnosis of pulmonary sarcoidosis back in 2000 with a history and diagnosis of obstructive sleep apnea in the setting of remote COVID-19 infection which has exacerbated her symptoms of reactive airways disease and asthma   Pulmonary sarcoid repeat CT chest to assess for interval changes Positive asthma exacerbation at this time due to environmental exposures Recommend prednisone 20 mg daily for 7 days  Reactive airways disease and asthma At this time I would classify it as mild to moderate persistent Consider cough variant at this time as well Patient would like to restart Trelegy inhaler therapy as Judithann Sauger is expensive   OSA Patient continues to benefit from CPAP therapy Auto CPAP 5-15 Excellent compliance report AHI reduced to 1.2   Follow-up with cardiology as scheduled     MEDICATION ADJUSTMENTS/LABS AND TESTS ORDERED: Switch back to Trelegy 200 Stop Breztri  Avoid sick contacts Avoid secondhand smoke  Continue CPAP as prescribed Excellent job! A+!!!  Prednisone 20 mg daily for 7 days   Patient  satisfied with Plan of action and management. All questions answered  Follow-up in 6 months  Total Time Spent  22 mins   Oaklie Durrett Rozella Pesa, M.D.  [facility]   Medical Director [place] Director [facility] Cardio-Pulmonary Department

## 2022-04-26 ENCOUNTER — Telehealth: Payer: Self-pay | Admitting: Internal Medicine

## 2022-04-26 DIAGNOSIS — M5412 Radiculopathy, cervical region: Secondary | ICD-10-CM | POA: Diagnosis not present

## 2022-04-26 DIAGNOSIS — M5030 Other cervical disc degeneration, unspecified cervical region: Secondary | ICD-10-CM | POA: Diagnosis not present

## 2022-04-26 NOTE — Telephone Encounter (Signed)
Spoke to patient.  She is aware that prednisone was prescribed for 7 days per AVS. She voiced her understanding and had no further questions.  Nothing further needed.

## 2022-04-29 ENCOUNTER — Other Ambulatory Visit: Payer: Self-pay | Admitting: Family Medicine

## 2022-04-29 DIAGNOSIS — M5412 Radiculopathy, cervical region: Secondary | ICD-10-CM

## 2022-05-03 ENCOUNTER — Ambulatory Visit
Admission: RE | Admit: 2022-05-03 | Discharge: 2022-05-03 | Disposition: A | Discharge status: Home / Self Care | Payer: Medicare Other | Source: Ambulatory Visit | Attending: Family Medicine | Admitting: Family Medicine

## 2022-05-03 DIAGNOSIS — M4802 Spinal stenosis, cervical region: Secondary | ICD-10-CM | POA: Diagnosis not present

## 2022-05-03 DIAGNOSIS — M5412 Radiculopathy, cervical region: Secondary | ICD-10-CM

## 2022-05-08 DIAGNOSIS — F411 Generalized anxiety disorder: Secondary | ICD-10-CM | POA: Diagnosis not present

## 2022-06-17 ENCOUNTER — Encounter (INDEPENDENT_AMBULATORY_CARE_PROVIDER_SITE_OTHER): Payer: Self-pay

## 2022-06-19 DIAGNOSIS — F411 Generalized anxiety disorder: Secondary | ICD-10-CM | POA: Diagnosis not present

## 2022-07-02 DIAGNOSIS — H43811 Vitreous degeneration, right eye: Secondary | ICD-10-CM | POA: Diagnosis not present

## 2022-07-03 DIAGNOSIS — F411 Generalized anxiety disorder: Secondary | ICD-10-CM | POA: Diagnosis not present

## 2022-07-17 DIAGNOSIS — F411 Generalized anxiety disorder: Secondary | ICD-10-CM | POA: Diagnosis not present

## 2022-07-20 ENCOUNTER — Other Ambulatory Visit: Payer: Self-pay | Admitting: Internal Medicine

## 2022-07-20 DIAGNOSIS — J4530 Mild persistent asthma, uncomplicated: Secondary | ICD-10-CM

## 2022-07-20 DIAGNOSIS — D869 Sarcoidosis, unspecified: Secondary | ICD-10-CM

## 2022-07-23 DIAGNOSIS — M48062 Spinal stenosis, lumbar region with neurogenic claudication: Secondary | ICD-10-CM | POA: Diagnosis not present

## 2022-07-23 DIAGNOSIS — M5412 Radiculopathy, cervical region: Secondary | ICD-10-CM | POA: Diagnosis not present

## 2022-07-23 DIAGNOSIS — M5416 Radiculopathy, lumbar region: Secondary | ICD-10-CM | POA: Diagnosis not present

## 2022-07-23 DIAGNOSIS — M4802 Spinal stenosis, cervical region: Secondary | ICD-10-CM | POA: Diagnosis not present

## 2022-07-23 DIAGNOSIS — M5030 Other cervical disc degeneration, unspecified cervical region: Secondary | ICD-10-CM | POA: Diagnosis not present

## 2022-07-23 DIAGNOSIS — M545 Low back pain, unspecified: Secondary | ICD-10-CM | POA: Diagnosis not present

## 2022-07-23 DIAGNOSIS — M5136 Other intervertebral disc degeneration, lumbar region: Secondary | ICD-10-CM | POA: Diagnosis not present

## 2022-07-23 DIAGNOSIS — M47816 Spondylosis without myelopathy or radiculopathy, lumbar region: Secondary | ICD-10-CM | POA: Diagnosis not present

## 2022-07-31 DIAGNOSIS — F411 Generalized anxiety disorder: Secondary | ICD-10-CM | POA: Diagnosis not present

## 2022-08-05 DIAGNOSIS — D631 Anemia in chronic kidney disease: Secondary | ICD-10-CM | POA: Diagnosis not present

## 2022-08-05 DIAGNOSIS — I129 Hypertensive chronic kidney disease with stage 1 through stage 4 chronic kidney disease, or unspecified chronic kidney disease: Secondary | ICD-10-CM | POA: Diagnosis not present

## 2022-08-05 DIAGNOSIS — N1832 Chronic kidney disease, stage 3b: Secondary | ICD-10-CM | POA: Diagnosis not present

## 2022-08-05 DIAGNOSIS — M1991 Primary osteoarthritis, unspecified site: Secondary | ICD-10-CM | POA: Diagnosis not present

## 2022-08-05 DIAGNOSIS — Q613 Polycystic kidney, unspecified: Secondary | ICD-10-CM | POA: Diagnosis not present

## 2022-08-21 DIAGNOSIS — M5416 Radiculopathy, lumbar region: Secondary | ICD-10-CM | POA: Diagnosis not present

## 2022-08-21 DIAGNOSIS — M48062 Spinal stenosis, lumbar region with neurogenic claudication: Secondary | ICD-10-CM | POA: Diagnosis not present

## 2022-08-22 ENCOUNTER — Ambulatory Visit: Payer: Self-pay | Admitting: *Deleted

## 2022-08-22 NOTE — Telephone Encounter (Signed)
Per agent" "Pt states at her kidney dr appt 2 wks ago her bp was elevated.  tHe dr increased her losartan to 50 mg.  Pt wants to see Dr Ancil Boozer concerning her bp.  She says Dr Ancil Boozer not aware she is having bp issues."    Chief Complaint: Hypertension Symptoms: BP elevated at kidney MD appt, losartan increased. Advised to monitor at home, see PCP. Frequency: 2 weeks Pertinent Negatives: Patient denies.Marland Kitchen no missed meds, weakness Disposition: '[]'$ ED /'[]'$ Urgent Care (no appt availability in office) / '[x]'$ Appointment(In office/virtual)/ '[]'$  [place] [facility]/ '[]'$ Home Care/ '[]'$ Refused Recommended Disposition /'[]'$  Mobile Bus/ '[]'$  Follow-up with PCP Additional Notes: Appt secured for tomorrow 08/23/22. Care advise provided, pt verbalizes understanding.  Reason for Disposition  Systolic BP  >= 097 OR Diastolic >= 353  Answer Assessment - Initial Assessment Questions 1. BLOOD PRESSURE: "What is the blood pressure?" "Did you take at least two measurements 5 minutes apart?"     147/91   150/95 2. ONSET: "When did you take your blood pressure?"    Last week and this AM 3. HOW: "How did you take your blood pressure?" (e.g., automatic home BP monitor, visiting nurse)     Kidney MD and home 4. HISTORY: "Do you have a history of high blood pressure?"     yes 5. MEDICINES: "Are you taking any medicines for blood pressure?" "Have you missed any doses recently?"     Yes, no missed doses 6. OTHER SYMPTOMS: "Do you have any symptoms?" (e.g., blurred vision, chest pain, difficulty breathing, headache, weakness)     Sinus pressure, headache  Protocols used: Blood Pressure - High-A-AH

## 2022-08-23 ENCOUNTER — Encounter: Payer: Self-pay | Admitting: Family Medicine

## 2022-08-23 ENCOUNTER — Ambulatory Visit (INDEPENDENT_AMBULATORY_CARE_PROVIDER_SITE_OTHER): Payer: Medicare Other | Admitting: Family Medicine

## 2022-08-23 ENCOUNTER — Ambulatory Visit: Payer: Medicare Other | Admitting: Family Medicine

## 2022-08-23 VITALS — BP 132/84 | HR 80 | Temp 98.0°F | Resp 16 | Ht 72.0 in | Wt 214.9 lb

## 2022-08-23 DIAGNOSIS — J329 Chronic sinusitis, unspecified: Secondary | ICD-10-CM | POA: Diagnosis not present

## 2022-08-23 DIAGNOSIS — I10 Essential (primary) hypertension: Secondary | ICD-10-CM

## 2022-08-23 NOTE — Progress Notes (Signed)
Patient ID: Melissa [name], female    DOB: 07/22/1951, 72 y.o.   MRN: [id number]  PCP: Steele Sizer, MD  Chief Complaint  Patient presents with   Follow-up   Hypertension    Pt states at home bp reading average are 140- high 301S systolic number and diastolic 01U-93A.    Tingling    Feels her lips a little tingling   Headache    Subjective:   Melissa [name] is a 72 y.o. female, presents to clinic with CC of the following:  HPI  Here for f/up on HTN, was for years well controlled, she was on losartan 25 mg and only recently was BP elevated w/o changes to diet/lifestyle or weight.  BP was 141/97 at nephrology, they increased her meds to losartan 50 mg She endorses good med compliance, she take all rx meds at night Home reading vary but have remained a little high still 120, 130's - 150's SBP at home, not checking regularly or certain times of day, it is making her anxious to check and to come here to recheck, so today she did not take the meds   She sees nephrology, reviewed their last note and labs She denies CP. SOB, LE edema, palpitations, near syncope She has some congestion right now, sinus pressure and a slight HA, which is getting better.  She does have allergy meds and nose sprays on chart but isn't doing nose sprays regularly   Patient Active Problem List   Diagnosis Date Noted   Polyp of transverse colon    Hematuria 01/03/2021   Total knee replacement status 10/18/2020   Primary osteoarthritis of right hip 08/06/2020   Sarcoidosis of lung ([place]) 05/29/2020   Aneurysm of celiac artery ([facility]) 02/11/2020   Localized swelling of right lower leg 09/06/2019   OSA on CPAP 09/06/2019   Secondary hyperparathyroidism of renal origin ([place]) 07/21/2019   Anemia in chronic kidney disease 06/14/2019   Benign hypertensive kidney disease with chronic kidney disease 06/14/2019   Sickle cell trait ([place]) 06/14/2019   Stage 3b chronic kidney disease ([place]) 06/14/2019   Chronic  bronchitis ([place]) 05/19/2019   Chronic venous insufficiency 04/15/2019   Lymphedema 04/15/2019   Family history of colon cancer    Trigeminal neuralgia 09/11/2018   Cardiac LV ejection fraction of 40-49% 05/07/2017   Low back pain without sciatica 02/21/2017   Renal cyst 10/18/2016   Atherosclerosis of aorta ([place]) 10/16/2016   History of colonic polyps 09/25/2016   Dyslipidemia 12/08/2015   Gastroesophageal reflux disease without esophagitis 08/11/2015   Primary osteoarthritis of knee 06/13/2015   Hip bursitis 06/13/2015   RLS (restless legs syndrome) 06/13/2015   Depression, major, in remission ([place]) 06/13/2015   B12 deficiency 06/13/2015   History of Roux-en-Y gastric bypass 06/13/2015   History of iron deficiency anemia 06/13/2015   Vitamin D deficiency 06/13/2015   Hypertension, benign 06/13/2015   Perennial allergic rhinitis with seasonal variation 06/13/2015   Primary osteoarthritis of both knees 06/13/2015   Cervical radiculitis 01/04/2014   Cervical spinal stenosis 01/04/2014   Cervical osteoarthritis 01/04/2014      Current Outpatient Medications:    acetaminophen (TYLENOL) 650 MG CR tablet, Take 1,300 mg by mouth every 8 (eight) hours as needed for pain., Disp: , Rfl:    albuterol (VENTOLIN HFA) 108 (90 Base) MCG/ACT inhaler, TAKE 2 PUFFS BY MOUTH EVERY 6 HOURS AS NEEDED FOR WHEEZE OR SHORTNESS OF BREATH, Disp: 18 each, Rfl: 0   Ascorbic Acid (  VITAMIN C) 1000 MG tablet, Take 1,000 mg by mouth daily., Disp: , Rfl:    aspirin EC 81 MG tablet, Take 1 tablet by mouth daily., Disp: , Rfl:    atorvastatin (LIPITOR) 40 MG tablet, Take 1 tablet (40 mg total) by mouth daily., Disp: 90 tablet, Rfl: 1   azelastine (ASTELIN) 0.1 % nasal spray, Place 2 sprays into both nostrils 2 (two) times daily. Use in each nostril as directed, Disp: 30 mL, Rfl: 12   budesonide (PULMICORT) 0.5 MG/2ML nebulizer solution, Take 2 mLs (0.5 mg total) by nebulization in the morning and at bedtime.,  Disp: 120 mL, Rfl: 6   calcium carbonate (TUMS - DOSED IN MG ELEMENTAL CALCIUM) 500 MG chewable tablet, Chew 500-1,000 mg by mouth daily as needed for indigestion or heartburn., Disp: , Rfl:    ezetimibe (ZETIA) 10 MG tablet, Take 1 tablet (10 mg total) by mouth daily., Disp: 90 tablet, Rfl: 3   ferrous sulfate 325 (65 FE) MG tablet, Take 325 mg by mouth daily with breakfast., Disp: , Rfl:    fluticasone (FLONASE) 50 MCG/ACT nasal spray, SPRAY 2 SPRAYS INTO EACH NOSTRIL EVERY DAY, Disp: 48 mL, Rfl: 1   Fluticasone-Umeclidin-Vilant (TRELEGY ELLIPTA) 200-62.5-25 MCG/ACT AEPB, Inhale 1 Act into the lungs daily., Disp: 1 each, Rfl: 5   ipratropium-albuterol (DUONEB) 0.5-2.5 (3) MG/3ML SOLN, Take 3 mLs by nebulization every 6 (six) hours as needed., Disp: 360 mL, Rfl: 3   levocetirizine (XYZAL ALLERGY 24HR) 5 MG tablet, Take 1 tablet (5 mg total) by mouth every evening., Disp: 30 tablet, Rfl: 6   losartan (COZAAR) 50 MG tablet, Take 50 mg by mouth daily., Disp: , Rfl:    magnesium oxide (MAG-OX) 400 MG tablet, Take 400 mg by mouth daily., Disp: , Rfl:    metaxalone (SKELAXIN) 800 MG tablet, Take 1 tablet (800 mg total) by mouth 3 (three) times daily as needed for muscle spasms., Disp: 90 tablet, Rfl: 0   Multiple Vitamins-Minerals (CENTRUM SILVER) tablet, Take 1 tablet by mouth daily., Disp: 30 tablet, Rfl: 0   Probiotic Product (FORTIFY PROBIOTIC WOMENS EX ST PO), Take by mouth daily., Disp: , Rfl:    zinc gluconate 50 MG tablet, Take 50 mg by mouth daily., Disp: , Rfl:    Allergies  Allergen Reactions   Contrast Media [Iodinated Contrast Media]     Unknown reaction   Shellfish Allergy     Edema     Social History   Tobacco Use   Smoking status: Never   Smokeless tobacco: Never   Tobacco comments:    Second hand smoke exposure  Vaping Use   Vaping Use: Never used  Substance Use Topics   Alcohol use: Not Currently    Alcohol/week: 0.0 standard drinks of alcohol   Drug use: No       Chart Review Today: I personally reviewed active problem list, medication list, allergies, family history, social history, health maintenance, notes from last encounter, lab results, imaging with the patient/caregiver today.   Review of Systems  Constitutional: Negative.   HENT: Negative.    Eyes: Negative.   Respiratory: Negative.    Cardiovascular: Negative.   Gastrointestinal: Negative.   Endocrine: Negative.   Genitourinary: Negative.   Musculoskeletal: Negative.   Skin: Negative.   Allergic/Immunologic: Negative.   Neurological: Negative.   Hematological: Negative.   Psychiatric/Behavioral: Negative.    All other systems reviewed and are negative.      Objective:   Vitals:   08/23/22  1535  BP: 132/84  Pulse: 80  Resp: 16  Temp: 98 F (36.7 C)  TempSrc: Oral  Weight: 214 lb 14.4 oz (97.5 kg)  Height: 6' (1.829 m)    Body mass index is 29.15 kg/m.  Physical Exam Vitals and nursing note reviewed.  Constitutional:      General: She is not in acute distress.    Appearance: Normal appearance. She is well-developed. She is not ill-appearing, toxic-appearing or diaphoretic.  HENT:     Head: Normocephalic and atraumatic.     Right Ear: External ear normal.     Nose: Congestion present.     Mouth/Throat:     Mouth: Mucous membranes are moist.     Pharynx: Oropharynx is clear. Posterior oropharyngeal erythema (mild injection) present. No oropharyngeal exudate.  Eyes:     General: No scleral icterus.       Right eye: No discharge.        Left eye: No discharge.     Conjunctiva/sclera: Conjunctivae normal.  Neck:     Trachea: No tracheal deviation.  Cardiovascular:     Rate and Rhythm: Normal rate and regular rhythm.     Heart sounds: Normal heart sounds. No murmur heard.    No friction rub. No gallop.  Pulmonary:     Effort: Pulmonary effort is normal. No respiratory distress.     Breath sounds: Normal breath sounds. No stridor. No wheezing, rhonchi or  rales.  Musculoskeletal:        General: Normal range of motion.     Cervical back: Normal range of motion and neck supple.  Skin:    General: Skin is warm and dry.     Findings: No rash.  Neurological:     Mental Status: She is alert.     Motor: No abnormal muscle tone.     Coordination: Coordination normal.  Psychiatric:        Attention and Perception: Attention normal.        Mood and Affect: Affect normal. Mood is anxious. Mood is not depressed.        Speech: Speech normal.        Behavior: Behavior normal. Behavior is cooperative.        Thought Content: Thought content normal.      Results for orders placed or performed in visit on 04/10/22  Lipid panel  Result Value Ref Range   Cholesterol 151 <200 mg/dL   HDL 82 > OR = 50 mg/dL   Triglycerides 50 <150 mg/dL   LDL Cholesterol (Calc) 57 mg/dL (calc)   Total CHOL/HDL Ratio 1.8 <5.0 (calc)   Non-HDL Cholesterol (Calc) 69 <130 mg/dL (calc)  COMPLETE METABOLIC PANEL WITH GFR  Result Value Ref Range   Glucose, Bld 91 65 - 99 mg/dL   BUN 21 7 - 25 mg/dL   Creat 1.50 (H) 0.50 - 1.05 mg/dL   eGFR 37 (L) > OR = 60 mL/min/1.103m   BUN/Creatinine Ratio 14 6 - 22 (calc)   Sodium 143 135 - 146 mmol/L   Potassium 4.8 3.5 - 5.3 mmol/L   Chloride 111 (H) 98 - 110 mmol/L   CO2 20 20 - 32 mmol/L   Calcium 9.5 8.6 - 10.4 mg/dL   Total Protein 7.1 6.1 - 8.1 g/dL   Albumin 4.1 3.6 - 5.1 g/dL   Globulin 3.0 1.9 - 3.7 g/dL (calc)   AG Ratio 1.4 1.0 - 2.5 (calc)   Total Bilirubin 0.5 0.2 - 1.2 mg/dL  Alkaline phosphatase (APISO) 60 37 - 153 U/L   AST 38 (H) 10 - 35 U/L   ALT 24 6 - 29 U/L       Assessment & Plan:     ICD-10-CM   1. Hypertension, benign  I10    BP improved and near goal, trial of switching med to am and monitoring BP, she has f/up with nephro in 3 weeks, f/up here as needed    2. Rhinosinusitis  J32.9    suspect HA from congestion, acute on chronic, encouraged her to use allergy meds and nasal sprays more  to help with sx     Did have extensive discussion about BP control, concerning signs of sx of end organ damage and HTN emergency - hand out on this given and reviewed with the pt She was also given DASH info and BP log - recommended checking a couple different times of day for a couple days in the next week or two to monitor, and otherwise stop doing BP cuff checking unless she has some concerning sx. The new losartan dose and higher BP numbers have caused a lot of anxiety Reassured pt that BP is looking better, she is also anxious and sick so likely her BP at rest is normal and at goal.     Return for routine f/up due with PCP in Feb.      Delsa Grana, [name] 08/23/22 3:58 PM

## 2022-08-23 NOTE — Patient Instructions (Addendum)
Switch losartan to in the morning and in about a week or so do a few days of BP checking to see various times of the day how your blood pressure is.  Blood Pressure Record Sheet  Blood pressure log Date: _______________________ a.m. _____________________(1st reading) _____________________(2nd reading) p.m. _____________________(1st reading) _____________________(2nd reading) Date: _______________________ a.m. _____________________(1st reading) _____________________(2nd reading) p.m. _____________________(1st reading) _____________________(2nd reading) Date: _______________________ a.m. _____________________(1st reading) _____________________(2nd reading) p.m. _____________________(1st reading) _____________________(2nd reading) Date: _______________________ a.m. _____________________(1st reading) _____________________(2nd reading) p.m. _____________________(1st reading) _____________________(2nd reading) Date: _______________________ a.m. _____________________(1st reading) _____________________(2nd reading) p.m. _____________________(1st reading) _____________________(2nd reading) Date: _______________________ a.m. _____________________(1st reading) _____________________(2nd reading) p.m. _____________________(1st reading) _____________________(2nd reading) Date: _______________________ a.m. _____________________(1st reading) _____________________(2nd reading) p.m. _____________________(1st reading) _____________________(2nd reading) Date: _______________________ a.m. _____________________(1st reading) _____________________(2nd reading) p.m. _____________________(1st reading) _____________________(2nd reading) Date: _______________________ a.m. _____________________(1st reading) _____________________(2nd reading) p.m. _____________________(1st reading) _____________________(2nd reading) Date: _______________________ a.m. _____________________(1st reading) _____________________(2nd  reading) p.m. _____________________(1st reading) _____________________(2nd reading)   How to Take Your Blood Pressure Blood pressure measures how strongly your blood is pressing against the walls of your arteries. Arteries are blood vessels that carry blood from your heart throughout your body. You can take your blood pressure at home with a machine. You may need to check your blood pressure at home: To check if you have high blood pressure (hypertension). To check your blood pressure over time. To make sure your blood pressure medicine is working. Supplies needed: Blood pressure machine, or monitor. A chair to sit in. This should be a chair where you can sit upright with your back supported. Do not sit on a soft couch or an armchair. Table or desk. Small notebook. Pencil or pen. How to prepare Avoid these things for 30 minutes before checking your blood pressure: Having drinks with caffeine in them, such as coffee or tea. Drinking alcohol. Eating. Smoking. Exercising. Do these things five minutes before checking your blood pressure: Go to the bathroom and pee (urinate). Sit in a chair. Be quiet. Do not talk. How to take your blood pressure Follow the instructions that came with your machine. If you have a digital blood pressure monitor, these may be the instructions: Sit up straight. Place your feet on the floor. Do not cross your ankles or legs. Rest your left arm at the level of your heart. You may rest it on a table, desk, or chair. Pull up your shirt sleeve. Wrap the blood pressure cuff around the upper part of your left arm. The cuff should be 1 inch (2.5 cm) above your elbow. It is best to wrap the cuff around bare skin. Fit the cuff snugly around your arm, but not too tightly. You should be able to place only one finger between the cuff and your arm. Place the cord so that it rests in the bend of your elbow. Press the power button. Sit quietly while the cuff fills with  air and loses air. Write down the numbers on the screen. Wait 2-3 minutes and then repeat steps 1-10. What do the numbers mean? Two numbers make up your blood pressure. The first number is called systolic pressure. The second is called diastolic pressure. An example of a blood pressure reading is "120 over 80" (or 120/80). If you are an adult and do not have a medical condition, use this guide to find out if your blood pressure is normal: Normal First number: below 120. Second number: below 80. Elevated First number: 120-129. Second number: below 80. Hypertension stage 1 First number:  130-139. Second number: 80-89. Hypertension stage 2 First number: 140 or above. Second number: 36 or above. Your blood pressure is above normal even if only the first or only the second number is above normal. Follow these instructions at home: Medicines Take over-the-counter and prescription medicines only as told by your doctor. Tell your doctor if your medicine is causing side effects. General instructions Check your blood pressure as often as your doctor tells you to. Check your blood pressure at the same time every day. Take your monitor to your next doctor's appointment. Your doctor will: Make sure you are using it correctly. Make sure it is working right. Understand what your blood pressure numbers should be. Keep all follow-up visits. General tips You will need a blood pressure machine or monitor. Your doctor can suggest a monitor. You can buy one at a drugstore or online. When choosing one: Choose one with an arm cuff. Choose one that wraps around your upper arm. Only one finger should fit between your arm and the cuff. Do not choose one that measures your blood pressure from your wrist or finger. Where to find more information American Heart Association: www.heart.org Contact a doctor if: Your blood pressure keeps being high. Your blood pressure is suddenly low. Get help right away  if: Your first blood pressure number is higher than 180. Your second blood pressure number is higher than 120. These symptoms may be an emergency. Do not wait to see if the symptoms will go away. Get help right away. Call 911. Summary Check your blood pressure at the same time every day. Avoid caffeine, alcohol, smoking, and exercise for 30 minutes before checking your blood pressure. Make sure you understand what your blood pressure numbers should be. This information is not intended to replace advice given to you by your health care provider. Make sure you discuss any questions you have with your health care provider. Document Revised: 04/19/2021 Document Reviewed: 04/19/2021 Elsevier Patient Education  [place].

## 2022-08-28 DIAGNOSIS — F411 Generalized anxiety disorder: Secondary | ICD-10-CM | POA: Diagnosis not present

## 2022-08-30 ENCOUNTER — Other Ambulatory Visit: Payer: Self-pay | Admitting: Family Medicine

## 2022-08-30 DIAGNOSIS — Z1231 Encounter for screening mammogram for malignant neoplasm of breast: Secondary | ICD-10-CM

## 2022-09-09 DIAGNOSIS — Q613 Polycystic kidney, unspecified: Secondary | ICD-10-CM | POA: Diagnosis not present

## 2022-09-09 DIAGNOSIS — I129 Hypertensive chronic kidney disease with stage 1 through stage 4 chronic kidney disease, or unspecified chronic kidney disease: Secondary | ICD-10-CM | POA: Diagnosis not present

## 2022-09-16 ENCOUNTER — Other Ambulatory Visit: Payer: Self-pay | Admitting: Nurse Practitioner

## 2022-09-16 DIAGNOSIS — Q613 Polycystic kidney, unspecified: Secondary | ICD-10-CM

## 2022-09-25 DIAGNOSIS — F411 Generalized anxiety disorder: Secondary | ICD-10-CM | POA: Diagnosis not present

## 2022-09-27 ENCOUNTER — Ambulatory Visit: Admission: RE | Admit: 2022-09-27 | Payer: Medicare Other | Source: Ambulatory Visit

## 2022-09-27 NOTE — Progress Notes (Unsigned)
Name: Melissa [name]   MRN: [id number]    DOB: Apr 01, 1951   Date:09/27/2022       Progress Note  Subjective  Chief Complaint  Follow Up  HPI  Atherosclerosis aorta and CAD/Celiac artery aneurysm : found on CT chest done 2021. She has been on statin therapy and aspirin, sees cardiologist , Dr. Rockey Situ yearly. Last CT renal stone was 06/22 and size of aneurysm 15 mm and stable, monitor every 2 -3 years. Repeat in 2024   OSA : she sees pulmonologist, wears CPAP every night. , under the care of Dr. Mortimer Fries    Sarcoidosis/Asthma/OSA and chronic bronchitis: no longer seeing f Dr. Regan Lemming at [facility], currently on [facility] and albuterol prn, and back on Dr. Mortimer Fries   . She has been evaluated by Rheumatologist and negative for sarcoidosis outside of lung. She has intermittent cough and will go back for repeat CT next week.   Last CT 10/04/2019  IMPRESSION:  Pulmonary parenchymal findings as above with associated calcified mediastinal/hilar adenopathy compatible with sequelae of known pulmonary sarcoidosis.   Mildly dilated main pulmonary artery suggesting elevated pulmonary arterial pressures.   CKI stage III: history of secondary hyperparathyroidism, under the care of nephrologist, last urine micro normal , last GFR was 35 back in June 2023. No pruritis or decrease in urine output    Sickle cell trait: asymptomatic. Unchanged    Depression Major Depression: she took medications for a while, it was present for a few years, she is in remission.  She sees her therapist every 2 weeks.    Cervical radiculitis: she used to get injections by Dr. Phyllis Ginger, she states lately noticing pain on right side of neck that radiates to right upper arm. She denies weakness  Muscles spasms: she states symptoms seems to be getting worse, toes , feet and has to get up to walk, it also affects her hands and abdominal wall. She takes skelaxin prn . She has an appointment with Dr. Melrose Nakayama. She states liquid IV seems to help.    Patient Active Problem List   Diagnosis Date Noted   Polyp of transverse colon    Hematuria 01/03/2021   Total knee replacement status 10/18/2020   Primary osteoarthritis of right hip 08/06/2020   Sarcoidosis of lung ([place]) 05/29/2020   Aneurysm of celiac artery ([facility]) 02/11/2020   Localized swelling of right lower leg 09/06/2019   OSA on CPAP 09/06/2019   Secondary hyperparathyroidism of renal origin ([place]) 07/21/2019   Anemia in chronic kidney disease 06/14/2019   Benign hypertensive kidney disease with chronic kidney disease 06/14/2019   Sickle cell trait ([place]) 06/14/2019   Stage 3b chronic kidney disease ([place]) 06/14/2019   Chronic bronchitis ([place]) 05/19/2019   Chronic venous insufficiency 04/15/2019   Lymphedema 04/15/2019   Family history of colon cancer    Trigeminal neuralgia 09/11/2018   Cardiac LV ejection fraction of 40-49% 05/07/2017   Low back pain without sciatica 02/21/2017   Renal cyst 10/18/2016   Atherosclerosis of aorta ([place]) 10/16/2016   History of colonic polyps 09/25/2016   Dyslipidemia 12/08/2015   Gastroesophageal reflux disease without esophagitis 08/11/2015   Primary osteoarthritis of knee 06/13/2015   Hip bursitis 06/13/2015   RLS (restless legs syndrome) 06/13/2015   Depression, major, in remission ([place]) 06/13/2015   B12 deficiency 06/13/2015   History of Roux-en-Y gastric bypass 06/13/2015   History of iron deficiency anemia 06/13/2015   Vitamin D deficiency 06/13/2015   Hypertension, benign 06/13/2015   Perennial allergic rhinitis  with seasonal variation 06/13/2015   Primary osteoarthritis of both knees 06/13/2015   Cervical radiculitis 01/04/2014   Cervical spinal stenosis 01/04/2014   Cervical osteoarthritis 01/04/2014    Past Surgical History:  Procedure Laterality Date   BARIATRIC SURGERY     CESAREAN SECTION     3 or more   COLONOSCOPY WITH PROPOFOL N/A 03/15/2019   Procedure: COLONOSCOPY WITH PROPOFOL;  Surgeon: [name],  MD;  Location: [facility];  Service: Gastroenterology;  Laterality: N/A;   COLONOSCOPY WITH PROPOFOL N/A 03/15/2022   Procedure: COLONOSCOPY WITH PROPOFOL;  Surgeon: [name], MD;  Location: [facility] ENDOSCOPY;  Service: Gastroenterology;  Laterality: N/A;   GASTRIC BYPASS     KNEE ARTHROPLASTY Right 10/18/2020   Procedure: COMPUTER ASSISTED TOTAL KNEE ARTHROPLASTY;  Surgeon: Dereck Leep, MD;  Location: [facility];  Service: Orthopedics;  Laterality: Right;   SHOULDER ARTHROSCOPY Right    TUBAL LIGATION      Family History  Problem Relation Age of Onset   Hypercholesterolemia Mother    Heart disease Mother    Hypertension Mother    Alcohol abuse Father    Lung cancer Brother    Alcohol abuse Brother    Diabetes Mellitus II Sister    Hypertension Maternal Grandmother    Colon cancer Sister    Breast cancer Neg Hx    Prostate cancer Neg Hx    Bladder Cancer Neg Hx    Kidney cancer Neg Hx     Social History   Tobacco Use   Smoking status: Never   Smokeless tobacco: Never   Tobacco comments:    Second hand smoke exposure  Substance Use Topics   Alcohol use: Not Currently    Alcohol/week: 0.0 standard drinks of alcohol     Current Outpatient Medications:    acetaminophen (TYLENOL) 650 MG CR tablet, Take 1,300 mg by mouth every 8 (eight) hours as needed for pain., Disp: , Rfl:    albuterol (VENTOLIN HFA) 108 (90 Base) MCG/ACT inhaler, TAKE 2 PUFFS BY MOUTH EVERY 6 HOURS AS NEEDED FOR WHEEZE OR SHORTNESS OF BREATH, Disp: 18 each, Rfl: 0   Ascorbic Acid (VITAMIN C) 1000 MG tablet, Take 1,000 mg by mouth daily., Disp: , Rfl:    aspirin EC 81 MG tablet, Take 1 tablet by mouth daily., Disp: , Rfl:    atorvastatin (LIPITOR) 40 MG tablet, Take 1 tablet (40 mg total) by mouth daily., Disp: 90 tablet, Rfl: 1   azelastine (ASTELIN) 0.1 % nasal spray, Place 2 sprays into both nostrils 2 (two) times daily. Use in each nostril as directed, Disp: 30 mL, Rfl: 12   budesonide  (PULMICORT) 0.5 MG/2ML nebulizer solution, Take 2 mLs (0.5 mg total) by nebulization in the morning and at bedtime., Disp: 120 mL, Rfl: 6   calcium carbonate (TUMS - DOSED IN MG ELEMENTAL CALCIUM) 500 MG chewable tablet, Chew 500-1,000 mg by mouth daily as needed for indigestion or heartburn., Disp: , Rfl:    ezetimibe (ZETIA) 10 MG tablet, Take 1 tablet (10 mg total) by mouth daily., Disp: 90 tablet, Rfl: 3   ferrous sulfate 325 (65 FE) MG tablet, Take 325 mg by mouth daily with breakfast., Disp: , Rfl:    fluticasone (FLONASE) 50 MCG/ACT nasal spray, SPRAY 2 SPRAYS INTO EACH NOSTRIL EVERY DAY, Disp: 48 mL, Rfl: 1   Fluticasone-Umeclidin-Vilant (TRELEGY ELLIPTA) 200-62.5-25 MCG/ACT AEPB, Inhale 1 Act into the lungs daily., Disp: 1 each, Rfl: 5   ipratropium-albuterol (DUONEB) 0.5-2.5 (  3) MG/3ML SOLN, Take 3 mLs by nebulization every 6 (six) hours as needed., Disp: 360 mL, Rfl: 3   levocetirizine (XYZAL ALLERGY 24HR) 5 MG tablet, Take 1 tablet (5 mg total) by mouth every evening., Disp: 30 tablet, Rfl: 6   losartan (COZAAR) 50 MG tablet, Take 50 mg by mouth daily., Disp: , Rfl:    magnesium oxide (MAG-OX) 400 MG tablet, Take 400 mg by mouth daily., Disp: , Rfl:    metaxalone (SKELAXIN) 800 MG tablet, Take 1 tablet (800 mg total) by mouth 3 (three) times daily as needed for muscle spasms., Disp: 90 tablet, Rfl: 0   Multiple Vitamins-Minerals (CENTRUM SILVER) tablet, Take 1 tablet by mouth daily., Disp: 30 tablet, Rfl: 0   Probiotic Product (FORTIFY PROBIOTIC WOMENS EX ST PO), Take by mouth daily., Disp: , Rfl:    zinc gluconate 50 MG tablet, Take 50 mg by mouth daily., Disp: , Rfl:   Allergies  Allergen Reactions   Contrast Media [Iodinated Contrast Media]     Unknown reaction   Shellfish Allergy     Edema    I personally reviewed active problem list, medication list, allergies, family history, social history, health maintenance with the patient/caregiver  today.   ROS  ***  Objective  There were no vitals filed for this visit.  There is no height or weight on file to calculate BMI.  Physical Exam ***  No results found for this or any previous visit (from the past 2160 hour(s)).   PHQ2/9:    08/23/2022    3:35 PM 04/10/2022    9:32 AM 02/08/2022   10:20 AM 10/23/2021    1:46 PM 07/25/2021    1:16 PM  Depression screen PHQ 2/9  Decreased Interest 0 0 0 0 0  Down, Depressed, Hopeless 0 0 0 0 0  PHQ - 2 Score 0 0 0 0 0  Altered sleeping 0 1 0 3 0  Tired, decreased energy 0 0 1 0 0  Change in appetite 0 0 0 0 0  Feeling bad or failure about yourself  0 0 0 0 0  Trouble concentrating 0 0 0 0 0  Moving slowly or fidgety/restless 0 0 0 0 0  Suicidal thoughts 0 0 0 0 0  PHQ-9 Score 0 1 1 3 $ 0  Difficult doing work/chores Not difficult at all   Not difficult at all     phq 9 is {gen pos [id number]   Fall Risk:    08/23/2022    3:34 PM 04/10/2022    9:31 AM 02/08/2022   10:19 AM 10/23/2021    1:56 PM 07/25/2021    1:16 PM  Fall Risk   Falls in the past year? 0 0 1 0 0  Number falls in past yr: 0 0 0 0 0  Injury with Fall? 0 0 0 0 0  Risk for fall due to : No Fall Risks No Fall Risks No Fall Risks No Fall Risks No Fall Risks  Follow up Falls prevention discussed;Education provided;Falls evaluation completed Falls prevention discussed Falls prevention discussed Falls prevention discussed Falls prevention discussed      Functional Status Survey:      Assessment & Plan  *** There are no diagnoses linked to this encounter.

## 2022-09-30 ENCOUNTER — Encounter: Payer: Self-pay | Admitting: Family Medicine

## 2022-09-30 ENCOUNTER — Ambulatory Visit (INDEPENDENT_AMBULATORY_CARE_PROVIDER_SITE_OTHER): Payer: Medicare Other | Admitting: Family Medicine

## 2022-09-30 VITALS — BP 128/74 | HR 82 | Resp 16 | Ht 72.0 in | Wt 218.0 lb

## 2022-09-30 DIAGNOSIS — I728 Aneurysm of other specified arteries: Secondary | ICD-10-CM

## 2022-09-30 DIAGNOSIS — J4489 Other specified chronic obstructive pulmonary disease: Secondary | ICD-10-CM | POA: Diagnosis not present

## 2022-09-30 DIAGNOSIS — N2581 Secondary hyperparathyroidism of renal origin: Secondary | ICD-10-CM | POA: Diagnosis not present

## 2022-09-30 DIAGNOSIS — D860 Sarcoidosis of lung: Secondary | ICD-10-CM

## 2022-09-30 DIAGNOSIS — I700 Atherosclerosis of aorta: Secondary | ICD-10-CM | POA: Diagnosis not present

## 2022-09-30 DIAGNOSIS — D573 Sickle-cell trait: Secondary | ICD-10-CM

## 2022-09-30 DIAGNOSIS — N1832 Chronic kidney disease, stage 3b: Secondary | ICD-10-CM | POA: Diagnosis not present

## 2022-09-30 DIAGNOSIS — I10 Essential (primary) hypertension: Secondary | ICD-10-CM

## 2022-09-30 DIAGNOSIS — R42 Dizziness and giddiness: Secondary | ICD-10-CM

## 2022-09-30 DIAGNOSIS — F325 Major depressive disorder, single episode, in full remission: Secondary | ICD-10-CM

## 2022-09-30 DIAGNOSIS — G4733 Obstructive sleep apnea (adult) (pediatric): Secondary | ICD-10-CM

## 2022-09-30 MED ORDER — BLOOD GLUCOSE TEST VI STRP: 1.0000 | ORAL_STRIP | Freq: Three times a day (TID) | 0 refills | Status: DC

## 2022-09-30 MED ORDER — LANCET DEVICE MISC: 1.0000 | Freq: Three times a day (TID) | 0 refills | Status: AC

## 2022-09-30 MED ORDER — LANCETS MISC. MISC: 1.0000 | Freq: Three times a day (TID) | 0 refills | Status: AC

## 2022-09-30 MED ORDER — BLOOD GLUCOSE MONITORING SUPPL DEVI: 1.0000 | Freq: Three times a day (TID) | 0 refills | Status: DC

## 2022-09-30 MED ORDER — PREDNISONE 50 MG PO TABS: 50.0000 mg | ORAL_TABLET | Freq: Four times a day (QID) | ORAL | 0 refills | Status: AC

## 2022-10-02 DIAGNOSIS — M5030 Other cervical disc degeneration, unspecified cervical region: Secondary | ICD-10-CM | POA: Diagnosis not present

## 2022-10-02 DIAGNOSIS — M5136 Other intervertebral disc degeneration, lumbar region: Secondary | ICD-10-CM | POA: Diagnosis not present

## 2022-10-02 DIAGNOSIS — M5416 Radiculopathy, lumbar region: Secondary | ICD-10-CM | POA: Diagnosis not present

## 2022-10-02 DIAGNOSIS — M48062 Spinal stenosis, lumbar region with neurogenic claudication: Secondary | ICD-10-CM | POA: Diagnosis not present

## 2022-10-08 DIAGNOSIS — M5416 Radiculopathy, lumbar region: Secondary | ICD-10-CM | POA: Diagnosis not present

## 2022-10-08 DIAGNOSIS — M48062 Spinal stenosis, lumbar region with neurogenic claudication: Secondary | ICD-10-CM | POA: Diagnosis not present

## 2022-10-09 DIAGNOSIS — F411 Generalized anxiety disorder: Secondary | ICD-10-CM | POA: Diagnosis not present

## 2022-10-10 ENCOUNTER — Ambulatory Visit
Admission: RE | Admit: 2022-10-10 | Discharge: 2022-10-10 | Disposition: A | Discharge status: Home / Self Care | Payer: Medicare Other | Source: Ambulatory Visit | Attending: Nurse Practitioner | Admitting: Nurse Practitioner

## 2022-10-10 ENCOUNTER — Ambulatory Visit
Admission: RE | Admit: 2022-10-10 | Discharge: 2022-10-10 | Disposition: A | Discharge status: Home / Self Care | Payer: Medicare Other | Source: Ambulatory Visit | Attending: Family Medicine | Admitting: Family Medicine

## 2022-10-10 DIAGNOSIS — I728 Aneurysm of other specified arteries: Secondary | ICD-10-CM | POA: Insufficient documentation

## 2022-10-10 DIAGNOSIS — Q612 Polycystic kidney, adult type: Secondary | ICD-10-CM | POA: Diagnosis not present

## 2022-10-10 DIAGNOSIS — N209 Urinary calculus, unspecified: Secondary | ICD-10-CM | POA: Diagnosis not present

## 2022-10-10 DIAGNOSIS — M5136 Other intervertebral disc degeneration, lumbar region: Secondary | ICD-10-CM | POA: Diagnosis not present

## 2022-10-10 DIAGNOSIS — Q613 Polycystic kidney, unspecified: Secondary | ICD-10-CM | POA: Diagnosis not present

## 2022-10-10 DIAGNOSIS — I700 Atherosclerosis of aorta: Secondary | ICD-10-CM | POA: Diagnosis not present

## 2022-10-10 LAB — POCT I-STAT CREATININE: Creatinine, Ser: 1.6 mg/dL — ABNORMAL HIGH (ref 0.44–1.00)

## 2022-10-10 MED ORDER — IOHEXOL 350 MG/ML SOLN
100.0000 mL | Freq: Once | INTRAVENOUS | Status: AC | PRN
  Administered 2022-10-10: 80 mL via INTRAVENOUS

## 2022-10-17 DIAGNOSIS — Z96651 Presence of right artificial knee joint: Secondary | ICD-10-CM | POA: Diagnosis not present

## 2022-10-21 DIAGNOSIS — N1832 Chronic kidney disease, stage 3b: Secondary | ICD-10-CM | POA: Diagnosis not present

## 2022-10-21 DIAGNOSIS — I129 Hypertensive chronic kidney disease with stage 1 through stage 4 chronic kidney disease, or unspecified chronic kidney disease: Secondary | ICD-10-CM | POA: Diagnosis not present

## 2022-10-21 DIAGNOSIS — Q613 Polycystic kidney, unspecified: Secondary | ICD-10-CM | POA: Diagnosis not present

## 2022-10-23 DIAGNOSIS — F411 Generalized anxiety disorder: Secondary | ICD-10-CM | POA: Diagnosis not present

## 2022-10-29 ENCOUNTER — Ambulatory Visit: Payer: Medicare Other

## 2022-10-31 ENCOUNTER — Other Ambulatory Visit: Payer: Self-pay | Admitting: Family Medicine

## 2022-10-31 ENCOUNTER — Ambulatory Visit (INDEPENDENT_AMBULATORY_CARE_PROVIDER_SITE_OTHER): Payer: Medicare Other

## 2022-10-31 VITALS — Ht 72.0 in | Wt 218.0 lb

## 2022-10-31 DIAGNOSIS — Z0000 Encounter for general adult medical examination without abnormal findings: Secondary | ICD-10-CM

## 2022-10-31 DIAGNOSIS — J4521 Mild intermittent asthma with (acute) exacerbation: Secondary | ICD-10-CM

## 2022-10-31 MED ORDER — ALBUTEROL SULFATE HFA 108 (90 BASE) MCG/ACT IN AERS: 1.0000 | INHALATION_SPRAY | Freq: Four times a day (QID) | RESPIRATORY_TRACT | 0 refills | Status: DC | PRN

## 2022-10-31 NOTE — Patient Instructions (Signed)
[name] , Thank you for taking time to come for your Medicare Wellness Visit. I appreciate your ongoing commitment to your health goals. Please review the following plan we discussed and let me know if I can assist you in the future.   These are the goals we discussed:  Goals      Patient Stated     Patient states she is looking forward to planting a vegetable garden this year        This is a list of the screening recommended for you and due dates:  Health Maintenance  Topic Date Due   DTaP/Tdap/Td vaccine (2 - Td or Tdap) 04/28/2021   COVID-19 Vaccine (6 - 2023-24 season) 04/19/2022   Flu Shot  11/17/2022*   Mammogram  10/02/2023   Medicare Annual Wellness Visit  10/31/2023   Colon Cancer Screening  03/15/2025   Pneumonia Vaccine  Completed   DEXA scan (bone density measurement)  Completed   Hepatitis C Screening: USPSTF Recommendation to screen - Ages 68-79 yo.  Completed   Zoster (Shingles) Vaccine  Completed   HPV Vaccine  Aged Out  *Topic was postponed. The date shown is not the original due date.    Advanced directives: yes  Conditions/risks identified: none  Next appointment: Follow up in one year for your annual wellness visit 11/06/2023 '@10'$ :15 am telephone   Preventive Care 65 Years and Older, Female Preventive care refers to lifestyle choices and visits with your health care provider that can promote health and wellness. What does preventive care include? A yearly physical exam. This is also called an annual well check. Dental exams once or twice a year. Routine eye exams. Ask your health care provider how often you should have your eyes checked. Personal lifestyle choices, including: Daily care of your teeth and gums. Regular physical activity. Eating a healthy diet. Avoiding tobacco and drug use. Limiting alcohol use. Practicing safe sex. Taking low-dose aspirin every day. Taking vitamin and mineral supplements as recommended by your health care  provider. What happens during an annual well check? The services and screenings done by your health care provider during your annual well check will depend on your age, overall health, lifestyle risk factors, and family history of disease. Counseling  Your health care provider may ask you questions about your: Alcohol use. Tobacco use. Drug use. Emotional well-being. Home and relationship well-being. Sexual activity. Eating habits. History of falls. Memory and ability to understand (cognition). Work and work Statistician. Reproductive health. Screening  You may have the following tests or measurements: Height, weight, and BMI. Blood pressure. Lipid and cholesterol levels. These may be checked every 5 years, or more frequently if you are over 9 years old. Skin check. Lung cancer screening. You may have this screening every year starting at age 67 if you have a 30-pack-year history of smoking and currently smoke or have quit within the past 15 years. Fecal occult blood test (FOBT) of the stool. You may have this test every year starting at age 13. Flexible sigmoidoscopy or colonoscopy. You may have a sigmoidoscopy every 5 years or a colonoscopy every 10 years starting at age 70. Hepatitis C blood test. Hepatitis B blood test. Sexually transmitted disease (STD) testing. Diabetes screening. This is done by checking your blood sugar (glucose) after you have not eaten for a while (fasting). You may have this done every 1-3 years. Bone density scan. This is done to screen for osteoporosis. You may have this done starting at age  70. Mammogram. This may be done every 1-2 years. Talk to your health care provider about how often you should have regular mammograms. Talk with your health care provider about your test results, treatment options, and if necessary, the need for more tests. Vaccines  Your health care provider may recommend certain vaccines, such as: Influenza vaccine. This is  recommended every year. Tetanus, diphtheria, and acellular pertussis (Tdap, Td) vaccine. You may need a Td booster every 10 years. Zoster vaccine. You may need this after age 1. Pneumococcal 13-valent conjugate (PCV13) vaccine. One dose is recommended after age 104. Pneumococcal polysaccharide (PPSV23) vaccine. One dose is recommended after age 40. Talk to your health care provider about which screenings and vaccines you need and how often you need them. This information is not intended to replace advice given to you by your health care provider. Make sure you discuss any questions you have with your health care provider. Document Released: 09/01/2015 Document Revised: 04/24/2016 Document Reviewed: 06/06/2015 Elsevier Interactive Patient Education  2017 [place] Prevention in the Home Falls can cause injuries. They can happen to people of all ages. There are many things you can do to make your home safe and to help prevent falls. What can I do on the outside of my home? Regularly fix the edges of walkways and driveways and fix any cracks. Remove anything that might make you trip as you walk through a door, such as a raised step or threshold. Trim any bushes or trees on the path to your home. Use bright outdoor lighting. Clear any walking paths of anything that might make someone trip, such as rocks or tools. Regularly check to see if handrails are loose or broken. Make sure that both sides of any steps have handrails. Any raised decks and porches should have guardrails on the edges. Have any leaves, snow, or ice cleared regularly. Use sand or salt on walking paths during winter. Clean up any spills in your garage right away. This includes oil or grease spills. What can I do in the bathroom? Use night lights. Install grab bars by the toilet and in the tub and shower. Do not use towel bars as grab bars. Use non-skid mats or decals in the tub or shower. If you need to sit down in  the shower, use a plastic, non-slip stool. Keep the floor dry. Clean up any water that spills on the floor as soon as it happens. Remove soap buildup in the tub or shower regularly. Attach bath mats securely with double-sided non-slip rug tape. Do not have throw rugs and other things on the floor that can make you trip. What can I do in the bedroom? Use night lights. Make sure that you have a light by your bed that is easy to reach. Do not use any sheets or blankets that are too big for your bed. They should not hang down onto the floor. Have a firm chair that has side arms. You can use this for support while you get dressed. Do not have throw rugs and other things on the floor that can make you trip. What can I do in the kitchen? Clean up any spills right away. Avoid walking on wet floors. Keep items that you use a lot in easy-to-reach places. If you need to reach something above you, use a strong step stool that has a grab bar. Keep electrical cords out of the way. Do not use floor polish or wax that makes floors slippery.  If you must use wax, use non-skid floor wax. Do not have throw rugs and other things on the floor that can make you trip. What can I do with my stairs? Do not leave any items on the stairs. Make sure that there are handrails on both sides of the stairs and use them. Fix handrails that are broken or loose. Make sure that handrails are as long as the stairways. Check any carpeting to make sure that it is firmly attached to the stairs. Fix any carpet that is loose or worn. Avoid having throw rugs at the top or bottom of the stairs. If you do have throw rugs, attach them to the floor with carpet tape. Make sure that you have a light switch at the top of the stairs and the bottom of the stairs. If you do not have them, ask someone to add them for you. What else can I do to help prevent falls? Wear shoes that: Do not have high heels. Have rubber bottoms. Are comfortable  and fit you well. Are closed at the toe. Do not wear sandals. If you use a stepladder: Make sure that it is fully opened. Do not climb a closed stepladder. Make sure that both sides of the stepladder are locked into place. Ask someone to hold it for you, if possible. Clearly mark and make sure that you can see: Any grab bars or handrails. First and last steps. Where the edge of each step is. Use tools that help you move around (mobility aids) if they are needed. These include: Canes. Walkers. Scooters. Crutches. Turn on the lights when you go into a dark area. Replace any light bulbs as soon as they burn out. Set up your furniture so you have a clear path. Avoid moving your furniture around. If any of your floors are uneven, fix them. If there are any pets around you, be aware of where they are. Review your medicines with your doctor. Some medicines can make you feel dizzy. This can increase your chance of falling. Ask your doctor what other things that you can do to help prevent falls. This information is not intended to replace advice given to you by your health care provider. Make sure you discuss any questions you have with your health care provider. Document Released: 06/01/2009 Document Revised: 01/11/2016 Document Reviewed: 09/09/2014 Elsevier Interactive Patient Education  2017 [employer].

## 2022-10-31 NOTE — Progress Notes (Signed)
I connected with  [name] on 10/31/22 by a audio enabled telemedicine application and verified that I am speaking with the correct person using two identifiers.  Patient Location: Home  Provider Location: Office/Clinic  I discussed the limitations of evaluation and management by telemedicine. The patient expressed understanding and agreed to proceed.  Subjective:   Melissa [name] is a 72 y.o. female who presents for Medicare Annual (Subsequent) preventive examination.  Review of Systems    Cardiac Risk Factors include: advanced age (>52mn, >>18women);dyslipidemia;hypertension    Objective:    Today's Vitals   10/31/22 1131 10/31/22 1133  Weight: 218 lb (98.9 kg)   Height: 6' (1.829 m)   PainSc:  2    Body mass index is 29.57 kg/m.     10/31/2022   11:52 AM 10/23/2021    1:51 PM 10/29/2020    2:52 PM 10/18/2020   11:14 AM 10/12/2020    2:46 PM 10/10/2020   12:55 PM 10/08/2019   11:24 AM  Advanced Directives  Does Patient Have a Medical Advance Directive? No;Yes Yes No No No No No  Type of ACorporate [place]f [place] will       Copy of [place] Chart?  No - copy requested       Would patient like information on creating a medical advance directive?   No - Patient declined No - Patient declined Yes (MAU/Ambulatory/Procedural Areas - Information given)  Yes (MAU/Ambulatory/Procedural Areas - Information given)    Current Medications (verified) Outpatient Encounter Medications as of 10/31/2022  Medication Sig   acetaminophen (TYLENOL) 650 MG CR tablet Take 1,300 mg by mouth every 8 (eight) hours as needed for pain.   albuterol (VENTOLIN HFA) 108 (90 Base) MCG/ACT inhaler TAKE 2 PUFFS BY MOUTH EVERY 6 HOURS AS NEEDED FOR WHEEZE OR SHORTNESS OF BREATH   amLODipine (NORVASC) 5 MG tablet Take 5 mg by mouth daily.   Ascorbic Acid (VITAMIN C) 1000 MG tablet Take 1,000 mg by mouth daily.   aspirin EC 81 MG tablet Take 1 tablet  by mouth daily.   atorvastatin (LIPITOR) 40 MG tablet Take 1 tablet (40 mg total) by mouth daily.   azelastine (ASTELIN) 0.1 % nasal spray Place 2 sprays into both nostrils 2 (two) times daily. Use in each nostril as directed   Blood Glucose Monitoring Suppl DEVI 1 each by Does not apply route in the morning, at noon, and at bedtime. May substitute to any manufacturer covered by patient's insurance.   budesonide (PULMICORT) 0.5 MG/2ML nebulizer solution Take 2 mLs (0.5 mg total) by nebulization in the morning and at bedtime.   calcium carbonate (TUMS - DOSED IN MG ELEMENTAL CALCIUM) 500 MG chewable tablet Chew 500-1,000 mg by mouth daily as needed for indigestion or heartburn.   ezetimibe (ZETIA) 10 MG tablet Take 1 tablet (10 mg total) by mouth daily.   ferrous sulfate 325 (65 FE) MG tablet Take 325 mg by mouth daily with breakfast.   fluticasone (FLONASE) 50 MCG/ACT nasal spray SPRAY 2 SPRAYS INTO EACH NOSTRIL EVERY DAY   Fluticasone-Umeclidin-Vilant (TRELEGY ELLIPTA) 200-62.5-25 MCG/ACT AEPB Inhale 1 Act into the lungs daily.   ipratropium-albuterol (DUONEB) 0.5-2.5 (3) MG/3ML SOLN Take 3 mLs by nebulization every 6 (six) hours as needed.   levocetirizine (XYZAL ALLERGY 24HR) 5 MG tablet Take 1 tablet (5 mg total) by mouth every evening.   losartan (COZAAR) 50 MG tablet Take 50 mg by mouth daily.  magnesium oxide (MAG-OX) 400 MG tablet Take 400 mg by mouth daily.   metaxalone (SKELAXIN) 800 MG tablet Take 1 tablet (800 mg total) by mouth 3 (three) times daily as needed for muscle spasms.   Multiple Vitamins-Minerals (CENTRUM SILVER) tablet Take 1 tablet by mouth daily.   Probiotic Product (FORTIFY PROBIOTIC WOMENS EX ST PO) Take by mouth daily.   zinc gluconate 50 MG tablet Take 50 mg by mouth daily.   No facility-administered encounter medications on file as of 10/31/2022.    Allergies (verified) Contrast media [iodinated contrast media] and Shellfish allergy   History: Past Medical  History:  Diagnosis Date   Aortic atherosclerosis ([place])    Asthma    Bruising    Cardiomyopathy ([place])    Celiac artery aneurysm (HCC)    Cervical radiculopathy    CKD (chronic kidney disease), stage III (HCC)    Deficiency of vitamin B    Depression    Edema    Flank pain    Gross hematuria    History of 2019 novel coronavirus disease (COVID-19) 05/01/2019   History of bariatric surgery    HLD (hyperlipidemia)    HTN (hypertension)    Interstitial lung disease (HCC)    Iron deficiency anemia    Leukopenia    OA (osteoarthritis)    Obesity    OSA on CPAP    Osteopenia    Perennial allergic rhinitis    Pneumonia due to COVID-19 virus 2020   Renal cysts, acquired, bilateral    RLS (restless legs syndrome)    Sarcoidosis    Sickle cell trait (HCC)    Snoring    Spinal stenosis    Trigeminal neuralgia    Past Surgical History:  Procedure Laterality Date   BARIATRIC SURGERY     CESAREAN SECTION     3 or more   COLONOSCOPY WITH PROPOFOL N/A 03/15/2019   Procedure: COLONOSCOPY WITH PROPOFOL;  Surgeon: [name], MD;  Location: [facility] ENDOSCOPY;  Service: Gastroenterology;  Laterality: N/A;   COLONOSCOPY WITH PROPOFOL N/A 03/15/2022   Procedure: COLONOSCOPY WITH PROPOFOL;  Surgeon: [name], MD;  Location: [facility] ENDOSCOPY;  Service: Gastroenterology;  Laterality: N/A;   GASTRIC BYPASS     KNEE ARTHROPLASTY Right 10/18/2020   Procedure: COMPUTER ASSISTED TOTAL KNEE ARTHROPLASTY;  Surgeon: Dereck Leep, MD;  Location: [facility] ORS;  Service: Orthopedics;  Laterality: Right;   SHOULDER ARTHROSCOPY Right    TUBAL LIGATION     Family History  Problem Relation Age of Onset   Hypercholesterolemia Mother    Heart disease Mother    Hypertension Mother    Alcohol abuse Father    Lung cancer Brother    Alcohol abuse Brother    Diabetes Mellitus II Sister    Hypertension Maternal Grandmother    Colon cancer Sister    Breast cancer Neg Hx    Prostate cancer Neg Hx     Bladder Cancer Neg Hx    Kidney cancer Neg Hx    Social History   Socioeconomic History   Marital status: Widowed    Spouse name: Aniyia Rhodd   Number of children: 4   Years of education: Not on file   Highest education level: Some college, no degree  Occupational History    Comment: retired  Tobacco Use   Smoking status: Never   Smokeless tobacco: Never   Tobacco comments:    Second hand smoke exposure  Vaping Use   Vaping Use: Never used  Substance and Sexual Activity   Alcohol use: Not Currently    Alcohol/week: 0.0 standard drinks of alcohol   Drug use: No   Sexual activity: Yes    Partners: Male    Birth control/protection: Post-menopausal  Other Topics Concern   Not on file  Social History Narrative   Pt's son lives with her   Social Determinants of Health   Financial Resource Strain: Low Risk  (10/31/2022)   Overall Financial Resource Strain (CARDIA)    Difficulty of Paying Living Expenses: Not hard at all  Food Insecurity: No Food Insecurity (10/31/2022)   Hunger Vital Sign    Worried About Running Out of Food in the Last Year: Never true    [place] in the Last Year: Never true  Transportation Needs: No Transportation Needs (10/31/2022)   PRAPARE - Hydrologist (Medical): No    Lack of Transportation (Non-Medical): No  Physical Activity: Sufficiently Active (10/31/2022)   Exercise Vital Sign    Days of Exercise per Week: 4 days    Minutes of Exercise per Session: 90 min  Stress: No Stress Concern Present (10/31/2022)   [place]    Feeling of Stress : Not at all  Social Connections: Socially Isolated (10/31/2022)   Social Connection and Isolation Panel [NHANES]    Frequency of Communication with Friends and Family: More than three times a week    Frequency of Social Gatherings with Friends and Family: More than three times a week    Attends Religious  Services: Never    Marine scientist or Organizations: No    Attends Archivist Meetings: Never    Marital Status: Widowed    Tobacco Counseling Counseling given: Not Answered Tobacco comments: Second hand smoke exposure   Clinical Intake:  Pre-visit preparation completed: Yes  Pain : 0-10 Pain Score: 2  Pain Type: Chronic pain Pain Location: Back (hip pain) Pain Orientation: Lower Pain Descriptors / Indicators: Aching Pain Onset: More than a month ago Pain Frequency: Constant Pain Relieving Factors: rest, heat  Pain Relieving Factors: rest, heat  BMI - recorded: 29.57 Nutritional Status: BMI 25 -29 Overweight Nutritional Risks: None Diabetes: No  How often do you need to have someone help you when you read instructions, pamphlets, or other written materials from your doctor or pharmacy?: 1 - Never  Diabetic?no  Interpreter Needed?: No  Comments: son lives with her Information entered by :: B.Ahava Kissoon,LPN   Activities of Daily Living    10/31/2022   11:53 AM 09/30/2022   10:30 AM  In your present state of health, do you have any difficulty performing the following activities:  Hearing? 1 1  Vision? 0 0  Difficulty concentrating or making decisions? 0 0  Walking or climbing stairs? 0 0  Dressing or bathing? 0 0  Doing errands, shopping? 0 0  Preparing Food and eating ? N   Using the Toilet? N   In the past six months, have you accidently leaked urine? N   Do you have problems with loss of bowel control? N   Managing your Medications? N   Managing your Finances? N   Housekeeping or managing your Housekeeping? N     Patient Care Team: Steele Sizer, MD as PCP - General (Family Medicine) Lucilla Lame, MD as Consulting Physician (Gastroenterology) Sharlet Salina, MD as Referring Physician (Physical Medicine and Rehabilitation) Minna Merritts, MD as Consulting Physician (  Cardiology) Hooten, Laurice Record, MD (Orthopedic Surgery) Magdalen Spatz, NP as Nurse Practitioner (Pulmonary Disease) Lavonia Dana, MD as Consulting Physician (Nephrology) Laverda Page, MD as Referring Physician (Pulmonary Disease)  Indicate any recent Medical Services you may have received from other than [facility] providers in the past year (date may be approximate).     Assessment:   This is a routine wellness examination for [place].  Hearing/Vision screen Hearing Screening - Comments:: Inadequate hearing-she says she needs hearing test Vision Screening - Comments:: Adequate w/glasses [facility]  Dietary issues and exercise activities discussed: Current Exercise Habits: Structured exercise class, Time (Minutes): 60, Frequency (Times/Week): 3, Weekly Exercise (Minutes/Week): 180, Intensity: Mild, Exercise limited by: orthopedic condition(s);respiratory conditions(s)   Goals Addressed             This Visit's Progress    Patient Stated   On track    Patient states she is looking forward to planting a vegetable garden this year       Depression Screen    10/31/2022   11:44 AM 09/30/2022   10:30 AM 08/23/2022    3:35 PM 04/10/2022    9:32 AM 02/08/2022   10:20 AM 10/23/2021    1:46 PM 07/25/2021    1:16 PM  PHQ 2/9 Scores  PHQ - 2 Score 0 0 0 0 0 0 0  PHQ- 9 Score 1 3 0 '1 1 3 '$ 0    Fall Risk    10/31/2022   11:38 AM 09/30/2022   10:30 AM 08/23/2022    3:34 PM 04/10/2022    9:31 AM 02/08/2022   10:19 AM  Fall Risk   Falls in the past year? 0 0 0 0 1  Number falls in past yr: 0 0 0 0 0  Injury with Fall? 0 0 0 0 0  Risk for fall due to : No Fall Risks No Fall Risks No Fall Risks No Fall Risks No Fall Risks  Follow up Education provided;Falls prevention discussed Falls prevention discussed Falls prevention discussed;Education provided;Falls evaluation completed Falls prevention discussed Falls prevention discussed    FALL RISK PREVENTION PERTAINING TO THE HOME:  Any stairs in or around the home? Yes  If so, are there any without  handrails? Yes  Home free of loose throw rugs in walkways, pet beds, electrical cords, etc? Yes  Adequate lighting in your home to reduce risk of falls? Yes   ASSISTIVE DEVICES UTILIZED TO PREVENT FALLS:  Life alert? No has apple watch Use of a cane, walker or w/c? cane Grab bars in the bathroom? No  Shower chair or bench in shower? No  Elevated toilet seat or a handicapped toilet? No   Cognitive Function:        10/31/2022   11:57 AM  6CIT Screen  What Year? 0 points  What month? 0 points  What time? 0 points  Count back from 20 0 points  Months in reverse 0 points  Repeat phrase 0 points  Total Score 0 points    Immunizations Immunization History  Administered Date(s) Administered   Fluad Quad(high Dose 65+) 07/01/2019, 07/20/2020   Influenza, High Dose Seasonal PF 07/02/2018, 06/11/2021   Influenza,inj,Quad PF,6+ Mos 06/13/2015, 08/21/2016, 05/07/2017   PFIZER Comirnaty(Gray Top)Covid-19 Tri-Sucrose Vaccine 12/20/2020   PFIZER(Purple Top)SARS-COV-2 Vaccination 10/02/2019, 10/23/2019, 05/12/2020   Pfizer Covid-19 Vaccine Bivalent Booster 12yr & up 08/21/2021   Pneumococcal Conjugate-13 02/24/2018   Pneumococcal Polysaccharide-23 01/26/2020   Tdap 04/29/2011   Zoster Recombinat (Shingrix) 02/24/2018,  10/29/2018   Zoster, Live 08/21/2016    TDAP status: Up to date  Flu Vaccine status: Up to date  Pneumococcal vaccine status: Up to date  Covid-19 vaccine status: Completed vaccines  Qualifies for Shingles Vaccine? Yes   Zostavax completed Yes   Shingrix Completed?: Yes  Screening Tests Health Maintenance  Topic Date Due   DTaP/Tdap/Td (2 - Td or Tdap) 04/28/2021   COVID-19 Vaccine (6 - 2023-24 season) 04/19/2022   INFLUENZA VACCINE  11/17/2022 (Originally 03/19/2022)   MAMMOGRAM  10/02/2023   Medicare Annual Wellness (AWV)  10/31/2023   COLONOSCOPY (Pts 45-61yr Insurance coverage will need to be confirmed)  03/15/2025   Pneumonia Vaccine 70 Years old   Completed   DEXA SCAN  Completed   Hepatitis C Screening  Completed   Zoster Vaccines- Shingrix  Completed   HPV VACCINES  Aged Out    Health Maintenance  Health Maintenance Due  Topic Date Due   DTaP/Tdap/Td (2 - Td or Tdap) 04/28/2021   COVID-19 Vaccine (6 - 2023-24 season) 04/19/2022    Colorectal cancer screening: Type of screening: Colonoscopy. Completed yes. Repeat every 5 years  Mammogram status: Ordered yes. Pt provided with contact info and advised to call to schedule appt. yes  Bone Density status: Completed yes. Results reflect: Bone density results: NORMAL. Repeat every 5 years.  Lung Cancer Screening: (Low Dose CT Chest recommended if Age 70-80years, 30 pack-year currently smoking OR have quit w/in 15years.) does not qualify.   Lung Cancer Screening Referral: no  Additional Screening:  Hepatitis C Screening: does not qualify; Completed no  Vision Screening: Recommended annual ophthalmology exams for early detection of glaucoma and other disorders of the eye. Is the patient up to date with their annual eye exam?  Yes  Who is the provider or what is the name of the office in which the patient attends annual eye exams? [place] pt is not established with a provider, would they like to be referred to a provider to establish care? No .   Dental Screening: Recommended annual dental exams for proper oral hygiene  Community Resource Referral / Chronic Care Management: CRR required this visit?  No   CCM required this visit?  No      Plan:     I have personally reviewed and noted the following in the patient's chart:   Medical and social history Use of alcohol, tobacco or illicit drugs  Current medications and supplements including opioid prescriptions. Patient is not currently taking opioid prescriptions. Functional ability and status Nutritional status Physical activity Advanced directives List of other physicians Hospitalizations, surgeries, and  ER visits in previous 12 months Vitals Screenings to include cognitive, depression, and falls Referrals and appointments  In addition, I have reviewed and discussed with patient certain preventive protocols, quality metrics, and best practice recommendations. A written personalized care plan for preventive services as well as general preventive health recommendations were provided to patient.     [facility] LPN   [id number]  Nurse Notes: pt relays she is doing well. She still haggles with back and hip pain everyday. Some days it is worse than others. She goes to water aerobics three times weekly to keep moving, assist with mobility and pain.   *Pt relays she needs Albuterol inhaler renewal.

## 2022-11-02 ENCOUNTER — Other Ambulatory Visit: Payer: Self-pay | Admitting: Internal Medicine

## 2022-11-02 DIAGNOSIS — J4521 Mild intermittent asthma with (acute) exacerbation: Secondary | ICD-10-CM

## 2022-11-02 DIAGNOSIS — D869 Sarcoidosis, unspecified: Secondary | ICD-10-CM

## 2022-11-11 ENCOUNTER — Other Ambulatory Visit: Payer: Self-pay | Admitting: Cardiovascular Disease

## 2022-11-11 ENCOUNTER — Other Ambulatory Visit: Payer: Self-pay | Admitting: Family Medicine

## 2022-11-11 DIAGNOSIS — I700 Atherosclerosis of aorta: Secondary | ICD-10-CM

## 2022-11-11 DIAGNOSIS — J4521 Mild intermittent asthma with (acute) exacerbation: Secondary | ICD-10-CM

## 2022-11-12 ENCOUNTER — Other Ambulatory Visit: Payer: Self-pay | Admitting: Family Medicine

## 2022-11-12 DIAGNOSIS — R42 Dizziness and giddiness: Secondary | ICD-10-CM

## 2022-11-18 ENCOUNTER — Other Ambulatory Visit: Payer: Self-pay | Admitting: Family Medicine

## 2022-11-18 DIAGNOSIS — R42 Dizziness and giddiness: Secondary | ICD-10-CM

## 2022-11-18 NOTE — Progress Notes (Unsigned)
Name: Melissa [name]   MRN: [id number]    DOB: Mar 04, 1951   Date:11/19/2022       Progress Note  Subjective  Chief Complaint  Dizziness Follow-Up  HPI  Dizziness: she states symptoms started early Feb  mostly when walking or shifting positions. No associated with SOB or spinning or palpitation. Lasts a few seconds and resolves but itself. She also noticed some headaches that were mild on top of her head. She was seen by neurologist this morning and will have an MRI done, symptoms stable except that tingling on her lips resolved. She will also contact Dr. Garen Grams due to history of DDD cervical spine  Muscles spasms: she states symptoms seems to be getting worse, toes , feet and has to get up to walk, it also affects her hands and abdominal wall. She takes skelaxin , gabapentin has helped with symptoms , dose advised recently   Patient Active Problem List   Diagnosis Date Noted   Polyp of transverse colon    Hematuria 01/03/2021   Total knee replacement status 10/18/2020   Primary osteoarthritis of right hip 08/06/2020   Sarcoidosis of lung 05/29/2020   Aneurysm of celiac artery 02/11/2020   Localized swelling of right lower leg 09/06/2019   OSA on CPAP 09/06/2019   Secondary hyperparathyroidism of renal origin 07/21/2019   Anemia in chronic kidney disease 06/14/2019   Benign hypertensive kidney disease with chronic kidney disease 06/14/2019   Sickle cell trait 06/14/2019   Stage 3b chronic kidney disease ([place]) 06/14/2019   Chronic bronchitis 05/19/2019   Chronic venous insufficiency 04/15/2019   Lymphedema 04/15/2019   Family history of colon cancer    Trigeminal neuralgia 09/11/2018   Cardiac LV ejection fraction of 40-49% 05/07/2017   Low back pain without sciatica 02/21/2017   Renal cyst 10/18/2016   Atherosclerosis of aorta 10/16/2016   History of colonic polyps 09/25/2016   Dyslipidemia 12/08/2015   Gastroesophageal reflux disease without esophagitis 08/11/2015   Primary  osteoarthritis of knee 06/13/2015   Hip bursitis 06/13/2015   RLS (restless legs syndrome) 06/13/2015   Depression, major, in remission 06/13/2015   B12 deficiency 06/13/2015   History of Roux-en-Y gastric bypass 06/13/2015   History of iron deficiency anemia 06/13/2015   Vitamin D deficiency 06/13/2015   Hypertension, benign 06/13/2015   Perennial allergic rhinitis with seasonal variation 06/13/2015   Primary osteoarthritis of both knees 06/13/2015   Cervical radiculitis 01/04/2014   Cervical spinal stenosis 01/04/2014   Cervical osteoarthritis 01/04/2014    Past Surgical History:  Procedure Laterality Date   BARIATRIC SURGERY     CESAREAN SECTION     3 or more   COLONOSCOPY WITH PROPOFOL N/A 03/15/2019   Procedure: COLONOSCOPY WITH PROPOFOL;  Surgeon: [name], MD;  Location: [facility] ENDOSCOPY;  Service: Gastroenterology;  Laterality: N/A;   COLONOSCOPY WITH PROPOFOL N/A 03/15/2022   Procedure: COLONOSCOPY WITH PROPOFOL;  Surgeon: [name], MD;  Location: [facility] ENDOSCOPY;  Service: Gastroenterology;  Laterality: N/A;   GASTRIC BYPASS     KNEE ARTHROPLASTY Right 10/18/2020   Procedure: COMPUTER ASSISTED TOTAL KNEE ARTHROPLASTY;  Surgeon: Dereck Leep, MD;  Location: [facility] ORS;  Service: Orthopedics;  Laterality: Right;   SHOULDER ARTHROSCOPY Right    TUBAL LIGATION      Family History  Problem Relation Age of Onset   Hypercholesterolemia Mother    Heart disease Mother    Hypertension Mother    Alcohol abuse Father    Lung cancer Brother  Alcohol abuse Brother    Diabetes Mellitus II Sister    Hypertension Maternal Grandmother    Colon cancer Sister    Breast cancer Neg Hx    Prostate cancer Neg Hx    Bladder Cancer Neg Hx    Kidney cancer Neg Hx     Social History   Tobacco Use   Smoking status: Never   Smokeless tobacco: Never   Tobacco comments:    Second hand smoke exposure  Substance Use Topics   Alcohol use: Not Currently     Alcohol/week: 0.0 standard drinks of alcohol     Current Outpatient Medications:    acetaminophen (TYLENOL) 650 MG CR tablet, Take 1,300 mg by mouth every 8 (eight) hours as needed for pain., Disp: , Rfl:    albuterol (VENTOLIN HFA) 108 (90 Base) MCG/ACT inhaler, Inhale 1-2 puffs into the lungs every 6 (six) hours as needed for wheezing or shortness of breath., Disp: 18 each, Rfl: 0   amLODipine (NORVASC) 5 MG tablet, Take 5 mg by mouth daily., Disp: , Rfl:    Ascorbic Acid (VITAMIN C) 1000 MG tablet, Take 1,000 mg by mouth daily., Disp: , Rfl:    aspirin EC 81 MG tablet, Take 1 tablet by mouth daily., Disp: , Rfl:    atorvastatin (LIPITOR) 40 MG tablet, TAKE 1 TABLET BY MOUTH EVERY DAY, Disp: 90 tablet, Rfl: 1   azelastine (ASTELIN) 0.1 % nasal spray, Place 2 sprays into both nostrils 2 (two) times daily. Use in each nostril as directed, Disp: 30 mL, Rfl: 12   Blood Glucose Monitoring Suppl DEVI, 1 each by Does not apply route in the morning, at noon, and at bedtime. May substitute to any manufacturer covered by patient's insurance., Disp: 1 each, Rfl: 0   budesonide (PULMICORT) 0.5 MG/2ML nebulizer solution, Take 2 mLs (0.5 mg total) by nebulization in the morning and at bedtime., Disp: 120 mL, Rfl: 6   calcium carbonate (TUMS - DOSED IN MG ELEMENTAL CALCIUM) 500 MG chewable tablet, Chew 500-1,000 mg by mouth daily as needed for indigestion or heartburn., Disp: , Rfl:    ezetimibe (ZETIA) 10 MG tablet, Take 1 tablet (10 mg total) by mouth daily., Disp: 90 tablet, Rfl: 3   ferrous sulfate 325 (65 FE) MG tablet, Take 325 mg by mouth daily with breakfast., Disp: , Rfl:    fluticasone (FLONASE) 50 MCG/ACT nasal spray, SPRAY 2 SPRAYS INTO EACH NOSTRIL EVERY DAY, Disp: 48 mL, Rfl: 1   glucose blood (ACCU-CHEK GUIDE) test strip, 1 EACH BY IN VITRO ROUTE IN THE MORNING, AT NOON, AND AT BEDTIME. MAY SUBSTITUTE TO ANY MANUFACTURER COVERED BY PATIENT'S INSURANCE., Disp: 200 strip, Rfl: 0    ipratropium-albuterol (DUONEB) 0.5-2.5 (3) MG/3ML SOLN, Take 3 mLs by nebulization every 6 (six) hours as needed., Disp: 360 mL, Rfl: 3   levocetirizine (XYZAL ALLERGY 24HR) 5 MG tablet, Take 1 tablet (5 mg total) by mouth every evening., Disp: 30 tablet, Rfl: 6   losartan (COZAAR) 50 MG tablet, Take 50 mg by mouth daily., Disp: , Rfl:    magnesium oxide (MAG-OX) 400 MG tablet, Take 400 mg by mouth daily., Disp: , Rfl:    metaxalone (SKELAXIN) 800 MG tablet, Take 1 tablet (800 mg total) by mouth 3 (three) times daily as needed for muscle spasms., Disp: 90 tablet, Rfl: 0   Multiple Vitamins-Minerals (CENTRUM SILVER) tablet, Take 1 tablet by mouth daily., Disp: 30 tablet, Rfl: 0   Probiotic Product (FORTIFY PROBIOTIC WOMENS EX  ST PO), Take by mouth daily., Disp: , Rfl:    TRELEGY ELLIPTA 200-62.5-25 MCG/ACT AEPB, INHALE ONE ACTUATION INTO THE LUNGS DAILY, Disp: 60 each, Rfl: 5   zinc gluconate 50 MG tablet, Take 50 mg by mouth daily., Disp: , Rfl:   Allergies  Allergen Reactions   Contrast Media [Iodinated Contrast Media]     Unknown reaction   Shellfish Allergy     Edema    I personally reviewed active problem list, medication list, allergies, family history, social history, health maintenance with the patient/caregiver today.   ROS  Ten systems reviewed and is negative except as mentioned in HPI   Objective  Vitals:   11/19/22 1101  BP: 118/76  Pulse: 91  Resp: 16  SpO2: 97%  Weight: 212 lb (96.2 kg)  Height: 6' (1.829 m)    Body mass index is 28.75 kg/m.  Physical Exam  Constitutional: Patient appears well-developed and well-nourished.  No distress.  HEENT: head atraumatic, normocephalic, pupils equal and reactive to light, neck supple Cardiovascular: Normal rate, regular rhythm and normal heart sounds.  No murmur heard. No BLE edema. Pulmonary/Chest: Effort normal and breath sounds normal. No respiratory distress. Abdominal: Soft.  There is no tenderness. Psychiatric:  Patient has a normal mood and affect. behavior is normal. Judgment and thought content normal.   Recent Results (from the past 2160 hour(s))  I-STAT creatinine     Status: Abnormal   Collection Time: 10/10/22  9:40 AM  Result Value Ref Range   Creatinine, Ser 1.60 (H) 0.44 - 1.00 mg/dL    PHQ2/9:    11/19/2022   11:01 AM 10/31/2022   11:44 AM 09/30/2022   10:30 AM 08/23/2022    3:35 PM 04/10/2022    9:32 AM  Depression screen PHQ 2/9  Decreased Interest 0 0 0 0 0  Down, Depressed, Hopeless 0 0 0 0 0  PHQ - 2 Score 0 0 0 0 0  Altered sleeping 0 0 0 0 1  Tired, decreased energy 1 1 3  0 0  Change in appetite 0 0 0 0 0  Feeling bad or failure about yourself  0 0 0 0 0  Trouble concentrating 0 0 0 0 0  Moving slowly or fidgety/restless 0 0 0 0 0  Suicidal thoughts 0 0 0 0 0  PHQ-9 Score 1 1 3  0 1  Difficult doing work/chores    Not difficult at all     phq 9 is negative   Fall Risk:    11/19/2022   11:00 AM 10/31/2022   11:38 AM 09/30/2022   10:30 AM 08/23/2022    3:34 PM 04/10/2022    9:31 AM  Fall Risk   Falls in the past year? 0 0 0 0 0  Number falls in past yr: 0 0 0 0 0  Injury with Fall? 0 0 0 0 0  Risk for fall due to : No Fall Risks No Fall Risks No Fall Risks No Fall Risks No Fall Risks  Follow up Falls prevention discussed Education provided;Falls prevention discussed Falls prevention discussed Falls prevention discussed;Education provided;Falls evaluation completed Falls prevention discussed      Functional Status Survey: Is the patient deaf or have difficulty hearing?: Yes Does the patient have difficulty seeing, even when wearing glasses/contacts?: No Does the patient have difficulty concentrating, remembering, or making decisions?: No Does the patient have difficulty walking or climbing stairs?: No Does the patient have difficulty dressing or bathing?: No Does the patient  have difficulty doing errands alone such as visiting a doctor's office or shopping?:  No    Assessment & Plan  1. Dizziness  Seen by neurologist , she will have MRI  2. Other headache syndrome   Continue medication , she will start medication today , given by neurologist

## 2022-11-19 ENCOUNTER — Ambulatory Visit (INDEPENDENT_AMBULATORY_CARE_PROVIDER_SITE_OTHER): Payer: Medicare Other | Admitting: Family Medicine

## 2022-11-19 ENCOUNTER — Encounter: Payer: Self-pay | Admitting: Family Medicine

## 2022-11-19 VITALS — BP 118/76 | HR 91 | Resp 16 | Ht 72.0 in | Wt 212.0 lb

## 2022-11-19 DIAGNOSIS — R519 Headache, unspecified: Secondary | ICD-10-CM | POA: Diagnosis not present

## 2022-11-19 DIAGNOSIS — R42 Dizziness and giddiness: Secondary | ICD-10-CM | POA: Diagnosis not present

## 2022-11-19 DIAGNOSIS — G4489 Other headache syndrome: Secondary | ICD-10-CM | POA: Diagnosis not present

## 2022-11-19 DIAGNOSIS — R252 Cramp and spasm: Secondary | ICD-10-CM | POA: Diagnosis not present

## 2022-11-19 DIAGNOSIS — G2581 Restless legs syndrome: Secondary | ICD-10-CM | POA: Diagnosis not present

## 2022-11-20 DIAGNOSIS — F411 Generalized anxiety disorder: Secondary | ICD-10-CM | POA: Diagnosis not present

## 2022-12-02 ENCOUNTER — Other Ambulatory Visit: Payer: Self-pay | Admitting: Internal Medicine

## 2022-12-02 ENCOUNTER — Other Ambulatory Visit: Payer: Self-pay | Admitting: Cardiovascular Disease

## 2022-12-02 DIAGNOSIS — J4521 Mild intermittent asthma with (acute) exacerbation: Secondary | ICD-10-CM

## 2022-12-03 NOTE — Telephone Encounter (Signed)
Requested Prescriptions  Pending Prescriptions Disp Refills   TRELEGY ELLIPTA 100-62.5-25 MCG/ACT AEPB [Pharmacy Med Name: TRELEGY ELLIPTA 100-62.5-25] 60 each 2    Sig: INHALE 1 PUFF BY MOUTH EVERY DAY     Off-Protocol Failed - 12/02/2022  6:25 AM      Failed - Medication not assigned to a protocol, review manually.      Passed - Valid encounter within last 12 months    Recent Outpatient Visits           2 weeks ago Dizziness   [facility] Alba Cory, MD   2 months ago Sarcoidosis of lung [facility])   [place] Alba Cory, MD   3 months ago Hypertension, benign   [facility] Danelle Berry, [name]   7 months ago Aneurysm of celiac artery [facility])   [facility] Alba Cory, MD   9 months ago Sarcoidosis of lung [facility])   [place] [facility] Alba Cory, MD       Future Appointments             In 2 months Gollan, Tollie Pizza, MD [facility] [place]   In 3 months Alba Cory, MD [facility]

## 2022-12-04 DIAGNOSIS — F411 Generalized anxiety disorder: Secondary | ICD-10-CM | POA: Diagnosis not present

## 2022-12-10 ENCOUNTER — Ambulatory Visit
Admission: RE | Admit: 2022-12-10 | Discharge: 2022-12-10 | Disposition: A | Discharge status: Home / Self Care | Payer: Medicare Other | Source: Ambulatory Visit | Attending: Family Medicine | Admitting: Family Medicine

## 2022-12-10 DIAGNOSIS — Z1231 Encounter for screening mammogram for malignant neoplasm of breast: Secondary | ICD-10-CM | POA: Diagnosis not present

## 2022-12-18 DIAGNOSIS — F411 Generalized anxiety disorder: Secondary | ICD-10-CM | POA: Diagnosis not present

## 2022-12-24 ENCOUNTER — Other Ambulatory Visit: Payer: Self-pay | Admitting: Physician Assistant

## 2022-12-24 DIAGNOSIS — R200 Anesthesia of skin: Secondary | ICD-10-CM

## 2022-12-24 DIAGNOSIS — R519 Headache, unspecified: Secondary | ICD-10-CM

## 2023-01-01 DIAGNOSIS — F411 Generalized anxiety disorder: Secondary | ICD-10-CM | POA: Diagnosis not present

## 2023-01-02 ENCOUNTER — Ambulatory Visit
Admission: RE | Admit: 2023-01-02 | Discharge: 2023-01-02 | Disposition: A | Discharge status: Home / Self Care | Payer: Medicare Other | Source: Ambulatory Visit | Attending: Physician Assistant | Admitting: Physician Assistant

## 2023-01-02 DIAGNOSIS — R519 Headache, unspecified: Secondary | ICD-10-CM | POA: Diagnosis not present

## 2023-01-02 DIAGNOSIS — H0520 Unspecified exophthalmos: Secondary | ICD-10-CM | POA: Diagnosis not present

## 2023-01-02 DIAGNOSIS — R200 Anesthesia of skin: Secondary | ICD-10-CM

## 2023-01-02 DIAGNOSIS — G319 Degenerative disease of nervous system, unspecified: Secondary | ICD-10-CM | POA: Diagnosis not present

## 2023-01-04 ENCOUNTER — Other Ambulatory Visit: Payer: Self-pay | Admitting: Cardiovascular Disease

## 2023-01-04 ENCOUNTER — Other Ambulatory Visit: Payer: Self-pay | Admitting: Family Medicine

## 2023-01-04 DIAGNOSIS — J4521 Mild intermittent asthma with (acute) exacerbation: Secondary | ICD-10-CM

## 2023-01-06 ENCOUNTER — Other Ambulatory Visit: Payer: Self-pay

## 2023-01-06 DIAGNOSIS — J4521 Mild intermittent asthma with (acute) exacerbation: Secondary | ICD-10-CM

## 2023-01-15 ENCOUNTER — Other Ambulatory Visit: Payer: Self-pay | Admitting: Internal Medicine

## 2023-01-15 DIAGNOSIS — F411 Generalized anxiety disorder: Secondary | ICD-10-CM | POA: Diagnosis not present

## 2023-01-15 DIAGNOSIS — J4530 Mild persistent asthma, uncomplicated: Secondary | ICD-10-CM

## 2023-01-15 DIAGNOSIS — D869 Sarcoidosis, unspecified: Secondary | ICD-10-CM

## 2023-01-17 ENCOUNTER — Other Ambulatory Visit: Payer: Self-pay

## 2023-01-17 ENCOUNTER — Ambulatory Visit: Payer: Self-pay | Admitting: *Deleted

## 2023-01-17 ENCOUNTER — Emergency Department: Payer: Medicare Other

## 2023-01-17 ENCOUNTER — Inpatient Hospital Stay
Admission: EM | Admit: 2023-01-17 | Discharge: 2023-01-20 | DRG: 287 | Disposition: A | Discharge status: Home / Self Care | Payer: Medicare Other | Attending: Obstetrics and Gynecology | Admitting: Obstetrics and Gynecology

## 2023-01-17 DIAGNOSIS — Z7722 Contact with and (suspected) exposure to environmental tobacco smoke (acute) (chronic): Secondary | ICD-10-CM | POA: Diagnosis present

## 2023-01-17 DIAGNOSIS — N1832 Chronic kidney disease, stage 3b: Secondary | ICD-10-CM | POA: Diagnosis not present

## 2023-01-17 DIAGNOSIS — I700 Atherosclerosis of aorta: Secondary | ICD-10-CM | POA: Diagnosis not present

## 2023-01-17 DIAGNOSIS — I129 Hypertensive chronic kidney disease with stage 1 through stage 4 chronic kidney disease, or unspecified chronic kidney disease: Secondary | ICD-10-CM | POA: Diagnosis present

## 2023-01-17 DIAGNOSIS — Z83438 Family history of other disorder of lipoprotein metabolism and other lipidemia: Secondary | ICD-10-CM

## 2023-01-17 DIAGNOSIS — J45909 Unspecified asthma, uncomplicated: Secondary | ICD-10-CM | POA: Diagnosis present

## 2023-01-17 DIAGNOSIS — G2581 Restless legs syndrome: Secondary | ICD-10-CM | POA: Diagnosis present

## 2023-01-17 DIAGNOSIS — G4733 Obstructive sleep apnea (adult) (pediatric): Secondary | ICD-10-CM

## 2023-01-17 DIAGNOSIS — E785 Hyperlipidemia, unspecified: Secondary | ICD-10-CM | POA: Diagnosis not present

## 2023-01-17 DIAGNOSIS — I428 Other cardiomyopathies: Secondary | ICD-10-CM | POA: Diagnosis present

## 2023-01-17 DIAGNOSIS — Z9989 Dependence on other enabling machines and devices: Secondary | ICD-10-CM

## 2023-01-17 DIAGNOSIS — R079 Chest pain, unspecified: Secondary | ICD-10-CM | POA: Diagnosis present

## 2023-01-17 DIAGNOSIS — R0789 Other chest pain: Secondary | ICD-10-CM | POA: Diagnosis not present

## 2023-01-17 DIAGNOSIS — R7989 Other specified abnormal findings of blood chemistry: Secondary | ICD-10-CM

## 2023-01-17 DIAGNOSIS — J42 Unspecified chronic bronchitis: Secondary | ICD-10-CM | POA: Diagnosis present

## 2023-01-17 DIAGNOSIS — J479 Bronchiectasis, uncomplicated: Secondary | ICD-10-CM | POA: Diagnosis present

## 2023-01-17 DIAGNOSIS — D860 Sarcoidosis of lung: Secondary | ICD-10-CM | POA: Diagnosis present

## 2023-01-17 DIAGNOSIS — R000 Tachycardia, unspecified: Secondary | ICD-10-CM

## 2023-01-17 DIAGNOSIS — Z9884 Bariatric surgery status: Secondary | ICD-10-CM | POA: Diagnosis not present

## 2023-01-17 DIAGNOSIS — N189 Chronic kidney disease, unspecified: Secondary | ICD-10-CM | POA: Diagnosis not present

## 2023-01-17 DIAGNOSIS — G8929 Other chronic pain: Secondary | ICD-10-CM | POA: Diagnosis present

## 2023-01-17 DIAGNOSIS — F32A Depression, unspecified: Secondary | ICD-10-CM | POA: Diagnosis present

## 2023-01-17 DIAGNOSIS — Z79899 Other long term (current) drug therapy: Secondary | ICD-10-CM

## 2023-01-17 DIAGNOSIS — M8580 Other specified disorders of bone density and structure, unspecified site: Secondary | ICD-10-CM | POA: Diagnosis present

## 2023-01-17 DIAGNOSIS — D573 Sickle-cell trait: Secondary | ICD-10-CM | POA: Diagnosis present

## 2023-01-17 DIAGNOSIS — Z801 Family history of malignant neoplasm of trachea, bronchus and lung: Secondary | ICD-10-CM

## 2023-01-17 DIAGNOSIS — J4489 Other specified chronic obstructive pulmonary disease: Secondary | ICD-10-CM | POA: Diagnosis present

## 2023-01-17 DIAGNOSIS — Z8249 Family history of ischemic heart disease and other diseases of the circulatory system: Secondary | ICD-10-CM

## 2023-01-17 DIAGNOSIS — I10 Essential (primary) hypertension: Secondary | ICD-10-CM | POA: Diagnosis present

## 2023-01-17 DIAGNOSIS — Z7951 Long term (current) use of inhaled steroids: Secondary | ICD-10-CM

## 2023-01-17 DIAGNOSIS — Z8616 Personal history of COVID-19: Secondary | ICD-10-CM | POA: Diagnosis not present

## 2023-01-17 DIAGNOSIS — M549 Dorsalgia, unspecified: Secondary | ICD-10-CM | POA: Diagnosis present

## 2023-01-17 DIAGNOSIS — Z833 Family history of diabetes mellitus: Secondary | ICD-10-CM

## 2023-01-17 DIAGNOSIS — Z811 Family history of alcohol abuse and dependence: Secondary | ICD-10-CM

## 2023-01-17 DIAGNOSIS — Z7982 Long term (current) use of aspirin: Secondary | ICD-10-CM

## 2023-01-17 DIAGNOSIS — Z800 Family history of malignant neoplasm of digestive organs: Secondary | ICD-10-CM

## 2023-01-17 DIAGNOSIS — Z96651 Presence of right artificial knee joint: Secondary | ICD-10-CM | POA: Diagnosis present

## 2023-01-17 DIAGNOSIS — Z8701 Personal history of pneumonia (recurrent): Secondary | ICD-10-CM

## 2023-01-17 DIAGNOSIS — I200 Unstable angina: Principal | ICD-10-CM

## 2023-01-17 DIAGNOSIS — R778 Other specified abnormalities of plasma proteins: Secondary | ICD-10-CM | POA: Diagnosis not present

## 2023-01-17 LAB — BASIC METABOLIC PANEL
Anion gap: 14 (ref 5–15)
BUN: 21 mg/dL (ref 8–23)
CO2: 21 mmol/L — ABNORMAL LOW (ref 22–32)
Calcium: 9.2 mg/dL (ref 8.9–10.3)
Chloride: 103 mmol/L (ref 98–111)
Creatinine, Ser: 1.64 mg/dL — ABNORMAL HIGH (ref 0.44–1.00)
GFR, Estimated: 34 mL/min — ABNORMAL LOW (ref >60)
Glucose, Bld: 168 mg/dL — ABNORMAL HIGH (ref 70–99)
Potassium: 4.3 mmol/L (ref 3.5–5.1)
Sodium: 138 mmol/L (ref 135–145)

## 2023-01-17 LAB — URINE DRUG SCREEN, QUALITATIVE (ARMC ONLY)
Amphetamines, Ur Screen: NOT DETECTED
Barbiturates, Ur Screen: NOT DETECTED
Benzodiazepine, Ur Scrn: NOT DETECTED
Cannabinoid 50 Ng, Ur ~~LOC~~: POSITIVE — AB
Cocaine Metabolite,Ur ~~LOC~~: NOT DETECTED
MDMA (Ecstasy)Ur Screen: NOT DETECTED
Methadone Scn, Ur: NOT DETECTED
Opiate, Ur Screen: NOT DETECTED
Phencyclidine (PCP) Ur S: NOT DETECTED
Tricyclic, Ur Screen: POSITIVE — AB

## 2023-01-17 LAB — TROPONIN I (HIGH SENSITIVITY)
Troponin I (High Sensitivity): 33 ng/L — ABNORMAL HIGH (ref <18)
Troponin I (High Sensitivity): 35 ng/L — ABNORMAL HIGH (ref <18)
Troponin I (High Sensitivity): 27 ng/L — ABNORMAL HIGH (ref <18)
Troponin I (High Sensitivity): 28 ng/L — ABNORMAL HIGH (ref <18)

## 2023-01-17 LAB — PROTIME-INR
INR: 1 (ref 0.8–1.2)
Prothrombin Time: 13.2 seconds (ref 11.4–15.2)

## 2023-01-17 LAB — CBC
HCT: 40.7 % (ref 36.0–46.0)
Hemoglobin: 13 g/dL (ref 12.0–15.0)
MCH: 29.5 pg (ref 26.0–34.0)
MCHC: 31.9 g/dL (ref 30.0–36.0)
MCV: 92.3 fL (ref 80.0–100.0)
Platelets: 218 10*3/uL (ref 150–400)
RBC: 4.41 MIL/uL (ref 3.87–5.11)
RDW: 12.4 % (ref 11.5–15.5)
WBC: 7.3 10*3/uL (ref 4.0–10.5)
nRBC: 0 % (ref 0.0–0.2)

## 2023-01-17 LAB — APTT: aPTT: 27 seconds (ref 24–36)

## 2023-01-17 MED ORDER — METHOCARBAMOL 500 MG PO TABS: 500.0000 mg | ORAL_TABLET | Freq: Three times a day (TID) | ORAL | Status: DC | PRN

## 2023-01-17 MED ORDER — EZETIMIBE 10 MG PO TABS
10.0000 mg | ORAL_TABLET | Freq: Every day | ORAL | Status: DC
  Administered 2023-01-18 – 2023-01-20 (×3): 10 mg via ORAL
  Filled 2023-01-17 (×3): qty 1

## 2023-01-17 MED ORDER — LOSARTAN POTASSIUM 50 MG PO TABS
50.0000 mg | ORAL_TABLET | Freq: Every day | ORAL | Status: DC
  Administered 2023-01-18 – 2023-01-20 (×3): 50 mg via ORAL
  Filled 2023-01-17 (×3): qty 1

## 2023-01-17 MED ORDER — IOHEXOL 350 MG/ML SOLN
60.0000 mL | Freq: Once | INTRAVENOUS | Status: AC | PRN
  Administered 2023-01-17: 60 mL via INTRAVENOUS

## 2023-01-17 MED ORDER — SACCHAROMYCES BOULARDII 250 MG PO CAPS
250.0000 mg | ORAL_CAPSULE | Freq: Every day | ORAL | Status: DC
  Administered 2023-01-18 – 2023-01-20 (×3): 250 mg via ORAL
  Filled 2023-01-17 (×3): qty 1

## 2023-01-17 MED ORDER — VITAMIN C 500 MG PO TABS
1000.0000 mg | ORAL_TABLET | Freq: Every day | ORAL | Status: DC
  Administered 2023-01-18: 500 mg via ORAL
  Administered 2023-01-19 – 2023-01-20 (×2): 1000 mg via ORAL
  Filled 2023-01-17 (×3): qty 2

## 2023-01-17 MED ORDER — NITROGLYCERIN 0.4 MG SL SUBL: 0.4000 mg | SUBLINGUAL_TABLET | SUBLINGUAL | Status: DC | PRN

## 2023-01-17 MED ORDER — ASPIRIN 81 MG PO CHEW
324.0000 mg | CHEWABLE_TABLET | Freq: Once | ORAL | Status: AC
  Administered 2023-01-17: 324 mg via ORAL
  Filled 2023-01-17: qty 4

## 2023-01-17 MED ORDER — ALBUTEROL SULFATE (2.5 MG/3ML) 0.083% IN NEBU
2.5000 mg | INHALATION_SOLUTION | RESPIRATORY_TRACT | Status: DC | PRN
  Filled 2023-01-17 (×2): qty 3

## 2023-01-17 MED ORDER — ZINC SULFATE 220 (50 ZN) MG PO CAPS
220.0000 mg | ORAL_CAPSULE | Freq: Every day | ORAL | Status: DC
  Administered 2023-01-18 – 2023-01-20 (×3): 220 mg via ORAL
  Filled 2023-01-17 (×3): qty 1

## 2023-01-17 MED ORDER — FLUTICASONE FUROATE-VILANTEROL 200-25 MCG/ACT IN AEPB
1.0000 | INHALATION_SPRAY | Freq: Every day | RESPIRATORY_TRACT | Status: DC
  Administered 2023-01-19 – 2023-01-20 (×2): 1 via RESPIRATORY_TRACT
  Filled 2023-01-17 (×2): qty 28

## 2023-01-17 MED ORDER — UMECLIDINIUM BROMIDE 62.5 MCG/ACT IN AEPB
1.0000 | INHALATION_SPRAY | Freq: Every day | RESPIRATORY_TRACT | Status: DC
  Administered 2023-01-19 – 2023-01-20 (×2): 1 via RESPIRATORY_TRACT
  Filled 2023-01-17 (×2): qty 7

## 2023-01-17 MED ORDER — ENOXAPARIN SODIUM 40 MG/0.4ML IJ SOSY
40.0000 mg | PREFILLED_SYRINGE | INTRAMUSCULAR | Status: DC
  Administered 2023-01-17 – 2023-01-19 (×3): 40 mg via SUBCUTANEOUS
  Filled 2023-01-17 (×3): qty 0.4

## 2023-01-17 MED ORDER — GABAPENTIN 100 MG PO CAPS
100.0000 mg | ORAL_CAPSULE | Freq: Every day | ORAL | Status: DC
  Administered 2023-01-18 – 2023-01-20 (×3): 100 mg via ORAL
  Filled 2023-01-17 (×3): qty 1

## 2023-01-17 MED ORDER — MAGNESIUM OXIDE -MG SUPPLEMENT 400 (240 MG) MG PO TABS
400.0000 mg | ORAL_TABLET | Freq: Every day | ORAL | Status: DC
  Administered 2023-01-18 – 2023-01-20 (×3): 400 mg via ORAL
  Filled 2023-01-17 (×3): qty 1

## 2023-01-17 MED ORDER — OXYCODONE-ACETAMINOPHEN 5-325 MG PO TABS: 1.0000 | ORAL_TABLET | ORAL | Status: DC | PRN

## 2023-01-17 MED ORDER — ADULT MULTIVITAMIN W/MINERALS CH
1.0000 | ORAL_TABLET | Freq: Every day | ORAL | Status: DC
  Administered 2023-01-18 – 2023-01-20 (×3): 1 via ORAL
  Filled 2023-01-17 (×3): qty 1

## 2023-01-17 MED ORDER — ATORVASTATIN CALCIUM 20 MG PO TABS
40.0000 mg | ORAL_TABLET | Freq: Every day | ORAL | Status: DC
  Administered 2023-01-17 – 2023-01-20 (×4): 40 mg via ORAL
  Filled 2023-01-17 (×5): qty 2

## 2023-01-17 MED ORDER — BUDESONIDE 0.5 MG/2ML IN SUSP
0.5000 mg | Freq: Two times a day (BID) | RESPIRATORY_TRACT | Status: DC
  Administered 2023-01-17 – 2023-01-20 (×6): 0.5 mg via RESPIRATORY_TRACT
  Filled 2023-01-17 (×6): qty 2

## 2023-01-17 MED ORDER — GABAPENTIN 100 MG PO CAPS
200.0000 mg | ORAL_CAPSULE | Freq: Every day | ORAL | Status: DC
  Administered 2023-01-17 – 2023-01-19 (×3): 200 mg via ORAL
  Filled 2023-01-17 (×3): qty 2

## 2023-01-17 MED ORDER — ASPIRIN 81 MG PO TBEC
81.0000 mg | DELAYED_RELEASE_TABLET | Freq: Every day | ORAL | Status: DC
  Administered 2023-01-18 – 2023-01-20 (×3): 81 mg via ORAL
  Filled 2023-01-17 (×3): qty 1

## 2023-01-17 MED ORDER — FERROUS SULFATE 325 (65 FE) MG PO TABS
325.0000 mg | ORAL_TABLET | Freq: Every day | ORAL | Status: DC
  Administered 2023-01-18 – 2023-01-20 (×3): 325 mg via ORAL
  Filled 2023-01-17 (×3): qty 1

## 2023-01-17 MED ORDER — ACETAMINOPHEN 325 MG PO TABS
650.0000 mg | ORAL_TABLET | Freq: Four times a day (QID) | ORAL | Status: DC | PRN
  Administered 2023-01-17 – 2023-01-19 (×3): 650 mg via ORAL
  Filled 2023-01-17 (×3): qty 2

## 2023-01-17 MED ORDER — HYDRALAZINE HCL 20 MG/ML IJ SOLN: 5.0000 mg | INTRAMUSCULAR | Status: DC | PRN

## 2023-01-17 MED ORDER — MORPHINE SULFATE (PF) 2 MG/ML IV SOLN: 2.0000 mg | INTRAVENOUS | Status: DC | PRN

## 2023-01-17 MED ORDER — DM-GUAIFENESIN ER 30-600 MG PO TB12: 1.0000 | ORAL_TABLET | Freq: Two times a day (BID) | ORAL | Status: DC | PRN

## 2023-01-17 MED ORDER — ONDANSETRON HCL 4 MG/2ML IJ SOLN: 4.0000 mg | Freq: Three times a day (TID) | INTRAMUSCULAR | Status: DC | PRN

## 2023-01-17 MED ORDER — NORTRIPTYLINE HCL 10 MG PO CAPS
10.0000 mg | ORAL_CAPSULE | Freq: Every day | ORAL | Status: DC
  Administered 2023-01-17: 10 mg via ORAL
  Administered 2023-01-18 – 2023-01-19 (×2): 20 mg via ORAL
  Filled 2023-01-17 (×4): qty 2

## 2023-01-17 MED ORDER — SODIUM CHLORIDE 0.9 % IV BOLUS
500.0000 mL | Freq: Once | INTRAVENOUS | Status: AC
  Administered 2023-01-17: 500 mL via INTRAVENOUS

## 2023-01-17 MED ORDER — AMLODIPINE BESYLATE 5 MG PO TABS
5.0000 mg | ORAL_TABLET | Freq: Every day | ORAL | Status: DC
  Administered 2023-01-18 – 2023-01-20 (×3): 5 mg via ORAL
  Filled 2023-01-17 (×3): qty 1

## 2023-01-17 NOTE — Progress Notes (Signed)
Cardiology Consultation   Patient ID: Melissa [name] MRN: [id number]; DOB: 1951-02-03  Admit date: 01/17/2023 Date of Consult: 01/17/2023  PCP:  Melissa Cory, [name]   [facility] Providers Cardiologist:  Melissa [name]   Patient Profile:   Melissa [name] is a 72 y.o. female with a hx of atherosclerosis of the aorta, hyperlipidemia, depression, obesity status post gastric bypass, sarcoidosis in the lung and MSK, CKD stage III, OSA on CPAP, RLS, sickle cell trait, cervical radiculopathy, chronic back pain who is being seen 01/17/2023 for the evaluation of chest pain at the request of Dr. Clyde [name].  History of Present Illness:   Melissa [name] is followed by Dr. Mariah [name] for the above cardiac issues.  Prior Myoview in March 2017 showed no ischemia, EF of 57%, low risk scan.  Echo April 2017 showed EF of 35 to 40%, severe hypokinesis of the mid and apical anteroseptal and anterior myocardium, grade 1 diastolic dysfunction, mild TR, normal PASP.  Follow-up limited echo in April 2017 showed an EF of 40 to 45%, mild concentric LVH, normal left atrium.  CT renal stone protocol from March 2018 showed aortic atherosclerosis.  She was seen by PCP in March 2020 and started on hydrochlorothiazide for ankle edema with improvement.  She was seen via telemedicine 01/2019 and recommended for repeat echocardiogram.  HCTZ was discontinued due to decline in renal function.  Echo in July 2020 showed EF of 50 to 55%, normal diastolic function, no wall motion abnormality, aortic root and ascending aorta normal in size, trivial MR.  Patient was last seen in June 2023 was overall doing well.  Patient presented to the ER 01/17/23 with chest pain.  Patient completed water aerobics and felt like her heart rate was elevated. Melissa [name] it was as high as 132 based on her watch. She didn't have any palpitations. She was sitting in the car when she developed chest pressure with radiation to the back. Pain was minor, described as a dull  pain. IT was constant and nothing made the chest pain worse. Back pain is chronic. No SOB, nausea, vomiting. No recent fever or chills. No LLE.   In the ER blood pressure 148/79, pulse rate 115 bpm, respiratory rate 17, afebrile.  Labs showed CO2 21, blood glucose 168, serum creatinine 1.64.  High-sensitivity troponin 33.  WBC 7.3, hemoglobin 13.  Chest x-ray with right apical pleural-parenchymal scarring and region of cystic bronchiectasis, unchanged from prior x-rays.  CT of the chest showed mild atherosclerotic calcified plaque, no aortic dissection or aneurysm, enlarged lymph nodes similar to previous examination.  EKG shows sinus tachycardia, 119 bpm, nonspecific T wave changes.   Past Medical History:  Diagnosis Date   Aortic atherosclerosis (HCC)    Asthma    Bruising    Cardiomyopathy (HCC)    Celiac artery aneurysm (HCC)    Cervical radiculopathy    CKD (chronic kidney disease), stage III (HCC)    Deficiency of vitamin B    Depression    Edema    Flank pain    Gross hematuria    History of 2019 novel coronavirus disease (COVID-19) 05/01/2019   History of bariatric surgery    HLD (hyperlipidemia)    HTN (hypertension)    Interstitial lung disease (HCC)    Iron deficiency anemia    Leukopenia    OA (osteoarthritis)    Obesity    OSA on CPAP    Osteopenia    Perennial allergic rhinitis    Pneumonia due  to COVID-19 virus 2020   Renal cysts, acquired, bilateral    RLS (restless legs syndrome)    Sarcoidosis    Sickle cell trait (HCC)    Snoring    Spinal stenosis    Trigeminal neuralgia     Past Surgical History:  Procedure Laterality Date   BARIATRIC SURGERY     CESAREAN SECTION     3 or more   COLONOSCOPY WITH PROPOFOL N/A 03/15/2019   Procedure: COLONOSCOPY WITH PROPOFOL;  Surgeon: Melissa [name];  Location: [facility] ENDOSCOPY;  Service: Gastroenterology;  Laterality: N/A;   COLONOSCOPY WITH PROPOFOL N/A 03/15/2022   Procedure: COLONOSCOPY WITH PROPOFOL;   Surgeon: Melissa [name];  [name];  Service: Gastroenterology;  Laterality: N/A;   GASTRIC BYPASS     KNEE ARTHROPLASTY Right 10/18/2020   Procedure: COMPUTER ASSISTED TOTAL KNEE ARTHROPLASTY;  Surgeon: Melissa [name];  Location: [facility] ORS;  Service: Orthopedics;  Laterality: Right;   SHOULDER ARTHROSCOPY Right    TUBAL LIGATION       Home Medications:  Prior to Admission medications   Medication Sig Start Date End Date Taking? Authorizing Provider  acetaminophen (TYLENOL) 650 MG CR tablet Take 1,300 mg by mouth every 8 (eight) hours as needed for pain.   Yes Provider, Historical, [name]  albuterol (VENTOLIN HFA) 108 (90 Base) MCG/ACT inhaler INHALE 1-2 PUFFS BY MOUTH EVERY 6 HOURS AS NEEDED FOR WHEEZE OR SHORTNESS OF BREATH 01/06/23  Yes Melissa [name]  amLODipine (NORVASC) 5 MG tablet Take 5 mg by mouth daily.   Yes [facility]  Ascorbic Acid (VITAMIN C) 1000 MG tablet Take 1,000 mg by mouth daily.   Yes Provider, Historical, [name]  aspirin EC 81 MG tablet Take 1 tablet by mouth daily. 08/27/18  Yes Provider, Historical, [name]  atorvastatin (LIPITOR) 40 MG tablet TAKE 1 TABLET BY MOUTH EVERY DAY 11/11/22  Yes Melissa [name]  azelastine (ASTELIN) 0.1 % nasal spray Place 2 sprays into both nostrils 2 (two) times daily. Use in each nostril as directed 12/07/21  Yes Melissa [name]  budesonide (PULMICORT) 0.5 MG/2ML nebulizer solution Take 2 mLs (0.5 mg total) by nebulization in the morning and at bedtime. 12/07/21  Yes Melissa [name]  calcium carbonate (TUMS - DOSED IN MG ELEMENTAL CALCIUM) 500 MG chewable tablet Chew 500-1,000 mg by mouth daily as needed for indigestion or heartburn.   Yes Provider, Historical, [name]  ezetimibe (ZETIA) 10 MG tablet TAKE 1 TABLET BY MOUTH EVERY DAY 01/06/23  Yes Melissa [name]  ferrous sulfate 325 (65 FE) MG tablet Take 325 mg by mouth daily with breakfast.   Yes Provider, Historical, [name]  fluticasone  (FLONASE) 50 MCG/ACT nasal spray SPRAY 2 SPRAYS INTO EACH NOSTRIL EVERY DAY 01/15/23  Yes Melissa [name]  gabapentin (NEURONTIN) 100 MG capsule Take 100-200 mg by mouth 2 (two) times daily. One in am and two at night 11/19/22  Yes Potter, Paulita Fujita, [name]  levocetirizine Elita Boone ALLERGY 24HR) 5 MG tablet Take 1 tablet (5 mg total) by mouth every evening. 01/16/22  Yes Kasa, Wallis Bamberg, [name]  losartan (COZAAR) 50 MG tablet Take 50 mg by mouth daily. 08/07/22  Yes Provider, Historical, [name]  magnesium oxide (MAG-OX) 400 MG tablet Take 400 mg by mouth daily.   Yes Provider, Historical, [name]  metaxalone (SKELAXIN) 800 MG tablet Take 1 tablet (800 mg total) by mouth 3 (three) times daily as needed for muscle spasms. 02/08/22  Yes Melissa Cory, [name]  methocarbamol (ROBAXIN) 500 MG tablet Take 500 mg by mouth every 8 (eight) hours as needed for muscle spasms. 12/02/22  Yes Provider, Historical, [name]  Multiple Vitamins-Minerals (CENTRUM SILVER) tablet Take 1 tablet by mouth daily. 07/02/18  Yes Melissa [name]  nortriptyline (PAMELOR) 10 MG capsule Take 10-20 mg by mouth at bedtime. 11/19/22  Yes Morene Crocker, [name]  Probiotic Product (FORTIFY PROBIOTIC WOMENS EX ST PO) Take by mouth daily.   Yes Provider, Historical, [name]  Dwyane Luo 200-62.5-25 MCG/ACT AEPB INHALE ONE ACTUATION INTO THE LUNGS DAILY 11/04/22  Yes Melissa [name]  zinc gluconate 50 MG tablet Take 50 mg by mouth daily.   Yes Provider, Historical, [name]  ipratropium-albuterol (DUONEB) 0.5-2.5 (3) MG/3ML SOLN Take 3 mLs by nebulization every 6 (six) hours as needed. 12/07/21   Melissa [name]    Inpatient Medications: Scheduled Meds:  [START ON 01/18/2023] aspirin EC  81 mg Oral Daily   atorvastatin  40 mg Oral Daily   enoxaparin (LOVENOX) injection  40 mg Subcutaneous Q24H   Continuous Infusions:  PRN Meds: acetaminophen, albuterol, dextromethorphan-guaiFENesin, hydrALAZINE, morphine injection, nitroGLYCERIN, ondansetron (ZOFRAN) IV,  oxyCODONE-acetaminophen  Allergies:    Allergies  Allergen Reactions   Shellfish Allergy     Edema    Social History:   Social History   Socioeconomic History   Marital status: Widowed    Spouse name: Jayelle Cavendish   Number of children: 4   Years of education: Not on file   Highest education level: Some college, no degree  Occupational History    Comment: retired  Tobacco Use   Smoking status: Never   Smokeless tobacco: Never   Tobacco comments:    Second hand smoke exposure  Vaping Use   Vaping Use: Never used  Substance and Sexual Activity   Alcohol use: Not Currently    Alcohol/week: 0.0 standard drinks of alcohol   Drug use: No   Sexual activity: Yes    Partners: Male    Birth control/protection: Post-menopausal  Other Topics Concern   Not on file  Social History Narrative   Pt's son lives with her   Social Determinants of Health   Financial Resource Strain: Low Risk  (10/31/2022)   Overall Financial Resource Strain (CARDIA)    Difficulty of Paying Living Expenses: Not hard at all  Food Insecurity: No Food Insecurity (10/31/2022)   Hunger Vital Sign    Worried About Running Out of Food in the Last Year: Never true    Ran Out of Food in the Last Year: Never true  Transportation Needs: No Transportation Needs (10/31/2022)   PRAPARE - Administrator, Civil Service (Medical): No    Lack of Transportation (Non-Medical): No  Physical Activity: Sufficiently Active (10/31/2022)   Exercise Vital Sign    Days of Exercise per Week: 4 days    Minutes of Exercise per Session: 90 min  Stress: No Stress Concern Present (10/31/2022)   [employer] of Occupational Health - Occupational Stress Questionnaire    Feeling of Stress : Not at all  Social Connections: Socially Isolated (10/31/2022)   Social Connection and Isolation Panel [NHANES]    Frequency of Communication with Friends and Family: More than three times a week    Frequency of Social Gatherings with  Friends and Family: More than three times a week    Attends Religious Services: Never    Database administrator or Organizations: No  Attends Banker Meetings: Never    Marital Status: Widowed  Intimate Partner Violence: Not At Risk (10/31/2022)   Humiliation, Afraid, Rape, and Kick questionnaire    Fear of Current or Ex-Partner: No    Emotionally Abused: No    Physically Abused: No    Sexually Abused: No    Family History:    Family History  Problem Relation Age of Onset   Hypercholesterolemia Mother    Heart disease Mother    Hypertension Mother    Alcohol abuse Father    Lung cancer Brother    Alcohol abuse Brother    Diabetes Mellitus II Sister    Hypertension Maternal Grandmother    Colon cancer Sister    Breast cancer Neg Hx    Prostate cancer Neg Hx    Bladder Cancer Neg Hx    Kidney cancer Neg Hx      ROS:  Please see the history of present illness.   All other ROS reviewed and negative.     Physical Exam/Data:   Vitals:   01/17/23 1125 01/17/23 1229  BP: (!) 148/79   Pulse: (!) 115   Resp: 17   Temp: 98.9 F (37.2 C)   TempSrc: Oral   SpO2: 98%   Weight:  96.2 kg  Height:  6' (1.829 m)   No intake or output data in the 24 hours ending 01/17/23 1546    01/17/2023   12:29 PM 11/19/2022   11:01 AM 10/31/2022   11:31 AM  Last 3 Weights  Weight (lbs) 212 lb 1.3 oz 212 lb 218 lb  Weight (kg) 96.2 kg 96.163 kg 98.884 kg     Body mass index is 28.76 kg/m.  General:  Well nourished, well developed, in no acute distress HEENT: normal Neck: no JVD Vascular: No carotid bruits; Distal pulses 2+ bilaterally Cardiac:  normal S1, S2; RRR; no murmur  Lungs:  clear to auscultation bilaterally, no wheezing, rhonchi or rales  Abd: soft, nontender, no hepatomegaly  Ext: no edema Musculoskeletal:  No deformities, BUE and BLE strength normal and equal Skin: warm and dry  Neuro:  CNs 2-12 intact, no focal abnormalities noted Psych:  Normal affect    EKG:  The EKG was personally reviewed and demonstrates: Sinus tachycardia, 119 bpm, T wave inversion in aVL Telemetry:  Telemetry was personally reviewed and demonstrates:  N/A  Relevant CV Studies:  Echo 2021 1. Left ventricular ejection fraction, by estimation, is 55 to 60%. The  left ventricle has normal function. Left ventricular endocardial border  not optimally defined to evaluate regional wall motion. Left ventricular  diastolic parameters were normal.   2. Right ventricular systolic function is normal. The right ventricular  size is normal. There is normal pulmonary artery systolic pressure.   3. The mitral valve is normal in structure. No evidence of mitral valve  regurgitation. No evidence of mitral stenosis.   4. The aortic valve is normal in structure. Aortic valve regurgitation is  not visualized. Mild aortic valve sclerosis is present, with no evidence  of aortic valve stenosis.   5. The inferior vena cava is normal in size with greater than 50%  respiratory variability, suggesting right atrial pressure of 3 mmHg.   Comparison(s): Previous Echo showed LV EF 50-55%, no RWMA, mild RAE.   Laboratory Data:  High Sensitivity Troponin:   Recent Labs  Lab 01/17/23 1132  TROPONINIHS 33*     Chemistry Recent Labs  Lab 01/17/23 1132  NA 138  K 4.3  CL 103  CO2 21*  GLUCOSE 168*  BUN 21  CREATININE 1.64*  CALCIUM 9.2  GFRNONAA 34*  ANIONGAP 14    No results for input(s): "PROT", "ALBUMIN", "AST", "ALT", "ALKPHOS", "BILITOT" in the last 168 hours. Lipids No results for input(s): "CHOL", "TRIG", "HDL", "LABVLDL", "LDLCALC", "CHOLHDL" in the last 168 hours.  Hematology Recent Labs  Lab 01/17/23 1132  WBC 7.3  RBC 4.41  HGB 13.0  HCT 40.7  MCV 92.3  MCH 29.5  MCHC 31.9  RDW 12.4  PLT 218   Thyroid No results for input(s): "TSH", "FREET4" in the last 168 hours.  BNPNo results for input(s): "BNP", "PROBNP" in the last 168 hours.  DDimer No results for  input(s): "DDIMER" in the last 168 hours.   Radiology/Studies:  CT Angio Chest Aorta W and/or Wo Contrast  Result Date: 01/17/2023 CLINICAL DATA:  Acute onset chest pain after her water aerobics class. EXAM: CT ANGIOGRAPHY CHEST WITH CONTRAST TECHNIQUE: Multidetector CT imaging of the chest was performed using the standard protocol during bolus administration of intravenous contrast. Multiplanar CT image reconstructions and MIPs were obtained to evaluate the vascular anatomy. RADIATION DOSE REDUCTION: This exam was performed according to the departmental dose-optimization program which includes automated exposure control, adjustment of the mA and/or kV according to patient size and/or use of iterative reconstruction technique. CONTRAST:  60mL OMNIPAQUE IOHEXOL 350 MG/ML SOLN COMPARISON:  Chest x-ray 01/17/2023 earlier. Noncontrast chest CT 04/15/2022 and older. FINDINGS: Cardiovascular: No aortic dissection or aneurysm formation. Mild atherosclerotic plaque. Diameter of the ascending aorta proximally approaches 2.8 cm. Diameter of the mid ascending aorta at the level of the main pulmonary artery measures 3.4 x 3.5 cm. Diameter of the aortic arch approaches 2.3 cm. And diameter of the mid descending thoracic aorta measures 2.6 by 2.6 cm. Heart is nonenlarged. Trace pericardial fluid. Mediastinum/Nodes: Preserved thyroid gland. The esophagus has a normal course and caliber. No abnormal lymph node enlargement identified in the axillary regions. There are several enlarged mediastinal hilar nodes. Several these have calcifications. Example right hilar node which has a previous short axis of 19 mm, today when measured in the similar fashion measures 19 mm on series 4, image 72. Precarinal node previously has a diameter in short axis of 14 mm and today when measured in the same fashion on series 4, image 59 14 mm. Other nodes are also similar including subcarinal and left hilar. Lungs/Pleura: No pneumothorax or  effusion. There are areas of reticular and nodular changes the lungs as well as some subtle ground-glass patchy areas and cavitary lesions. Extent and distribution is similar to previous. Example noncalcified non cavitary nodule left upper lobe which measured 4 mm, today measures 5 mm on series 6, image 52. Lesion seen more caudal in the left upper lobe trauma lingula which previously measured 8 mm in maximal dimension, today measures 8 mm on series 6, image 73. Upper Abdomen: Multilobular kidneys with the areas of atrophy and dilatation with cystic areas and stones. Surgical changes from gastric bypass. Musculoskeletal: Scattered degenerative changes along the spine. Diffuse scattered bridging endplate osteophytes and syndesmophytes. Review of the MIP images confirms the above findings. IMPRESSION: Mild atherosclerotic calcified plaque. No aortic dissection or aneurysm formation. Once again there are several enlarged partially calcified lymph nodes in the hilum and mediastinum as well as reticulonodular changes the lungs with cavitary and non cavitary ill-defined areas of nodularity. Overall the distribution is similar to previous examination. Please correlate for known history  and recommend continued follow-up in 6-12 months to ensure long-term stability. Aortic Atherosclerosis (ICD10-I70.0). Electronically Signed   By: Karen Kays M.D.   On: 01/17/2023 14:48   DG Chest 2 View  Result Date: 01/17/2023 CLINICAL DATA:  Chest pain after water aerobics class. High midsternal pain. EXAM: CHEST - 2 VIEW COMPARISON:  Chest radiographs 09/10/2021 and 09/07/2019; CT chest 04/15/2022 FINDINGS: Cardiac silhouette and mediastinal contours within normal limits. There is again flattening of the diaphragms and moderate hyperinflation. There are lucencies within the bilateral upper lungs, consistent with a bronchiectasis seen on prior CT. No acute airspace opacity is seen. No pulmonary edema, pleural effusion, or  pneumothorax. Right apical pleural-parenchymal scarring and region of cystic bronchiectasis on prior CT, unchanged from prior radiographs. Moderate multilevel degenerative disc changes of the thoracic spine. Left upper abdominal surgical clips. IMPRESSION: 1. No acute airspace opacity. 2. Chronic hyperinflation and bilateral upper lobe bronchiectasis. 3. Right apical pleural-parenchymal scarring and region of cystic bronchiectasis, unchanged from prior radiographs and CT. Electronically Signed   By: Neita Garnet M.D.   On: 01/17/2023 12:28     Assessment and Plan:   Chest pain - patient reports overall atypical chest pain that started after water aerobics and noting elevated HR to 130s. Pain went into there back - HS trop 33 - EKG showed ST with no ischemic changes - She reports 1/10 dull chest pain - CTA chest without dissection - BP good - No h/o CAD. She does have RF - started on ASA 81mg  daily - no telemetry for review - check an echo - continue to trend troponin. If this is significantly elevated, may need cath, however she has CKD and overall suspicion for ACS is low. May need Myoview lexiscan  tomorrow. Can consider heart monitor at d/c given concern for elevated heart rate.  HTN - BP good - PTA amlodipine 5mg  daily and Losartan 50mg  daily  HLD - LDL 57 in 2023 - Lipitor 40mg  daily  CKD stage 3 - stable SCr   For questions or updates, please contact [facility] Please consult [email or web address] for contact info under    Signed, Jeff Frieden David Stall, [name]  01/17/2023 3:46 PM

## 2023-01-17 NOTE — ED Notes (Signed)
See triage note  Presents with some chest pain and rapid heart rate  States the sx's started while doing water aerobics  States she just didn't feel right   then had some discomfort in mid chest  Denies pain at present

## 2023-01-17 NOTE — Telephone Encounter (Signed)
Reason for Disposition  [1] Acting confused (e.g., disoriented, slurred speech) AND [2] brief (now gone)  [1] Chest pain (or "angina") comes and goes AND [2] is happening more often (increasing in frequency) or getting worse (increasing in severity)  (Exception: Chest pains that last only a few seconds.)  Answer Assessment - Initial Assessment Questions 1. LEVEL OF CONSCIOUSNESS: "How is he (she, the patient) acting right now?" (e.g., alert-oriented, confused, lethargic, stuporous, comatose)     Alert and oriented 2. ONSET: "When did the confusion start?"  (minutes, hours, days)     9:30- lasted 5 minutes 3. PATTERN "Does this come and go, or has it been constant since it started?"  "Is it present now?"     Not present now 4. ALCOHOL or DRUGS: "Has he been drinking alcohol or taking any drugs?"      no 5. NARCOTIC MEDICINES: "Has he been receiving any narcotic medications?" (e.g., morphine, Vicodin)     no 6. CAUSE: "What do you think is causing the confusion?"      unsure 7. OTHER SYMPTOMS: "Are there any other symptoms?" (e.g., difficulty breathing, headache, fever, weakness)     HR 122  Answer Assessment - Initial Assessment Questions 1. DESCRIPTION: "Please describe your heart rate or heartbeat that you are having" (e.g., fast/slow, regular/irregular, skipped or extra beats, "palpitations")     Feels different from normal 2. ONSET: "When did it start?" (Minutes, hours or days)      After confusion 3. DURATION: "How long does it last" (e.g., seconds, minutes, hours)     ongoing 4. PATTERN "Does it come and go, or has it been constant since it started?"  "Does it get worse with exertion?"   "Are you feeling it now?"     constant 5. TAP: "Using your hand, can you tap out what you are feeling on a chair or table in front of you, so that I can hear?" (Note: not all patients can do this)       *No Answer* 6. HEART RATE: "Can you tell me your heart rate?" "How many beats in 15 seconds?"   (Note: not all patients can do this)       122 7. RECURRENT SYMPTOM: "Have you ever had this before?" If Yes, ask: "When was the last time?" and "What happened that time?"      no 8. CAUSE: "What do you think is causing the palpitations?"     unsure 9. CARDIAC HISTORY: "Do you have any history of heart disease?" (e.g., heart attack, angina, bypass surgery, angioplasty, arrhythmia)      no 10. OTHER SYMPTOMS: "Do you have any other symptoms?" (e.g., dizziness, chest pain, sweating, difficulty breathing)       confusion  Protocols used: Confusion - Delirium-A-AH, Heart Rate and Heartbeat Questions-A-AH, Chest Pain-A-AH

## 2023-01-17 NOTE — Telephone Encounter (Signed)
  Chief Complaint: confusion, rapid heart rate Symptoms: patient states she was at swim/exercise class- got confused, heart started racing, with chest discomfort continuing Frequency: started 9:30 Pertinent Negatives: Patient denies dizziness, chest pain, sweating, difficulty breathing Disposition: [x] ED /[] Urgent Care (no appt availability in office) / [] Appointment(In office/virtual)/ []  [facility]/ [] Home Care/ [] Refused Recommended Disposition /[] [facility]/ []  Follow-up with PCP Additional Notes: Due to combination/progression of symptoms- patient advised  ED. Patient does not have someone to drive her- advised 829 if needed.

## 2023-01-17 NOTE — H&P (Signed)
History and Physical    JAMEERAH BEBER ZOX:[id number] DOB: 1950-10-14 DOA: 01/17/2023  Referring MD/NP/PA:   PCP: Alba Cory, MD   Patient coming from:  The patient is coming from home.     Chief Complaint: chest pain  HPI: CANESHA HIPPS is a 72 y.o. female with medical history significant of HTN, HLD, asthma, OSA onCPAP, CKD-3b, sarcoidosis, sickle cell trait, s/p of Roux-en-Y gastric bypass, depression, who presents with chest pain.   Pt states that completed water aerobics today and felt like her heart rate was persistently elevated. Her heart rate was up to 130s by monitor. No palpitation or feeling of heart racing. She then developed chest pain, which is located in right side of chest, mild, pressure-like, radiating to the back.  She is not sure if her back pain back is due to exercise or from chest pain.  Denies shortness of breath, cough, fever or chills.  No nausea, vomiting, diarrhea or abdominal pain.  No symptoms of UTI.  No recent fall or rectal bleeding.  Data reviewed independently and ED Course: pt was found to have troponin 33, WBC 7.3, stable renal function, temperature normal, blood pressure 148/79, heart rate 115 --> 88, RR 17, oxygen saturation 98% on room air.  CTA negative for dissection. Patient is placed on telemetry bed for observation.  Dr. Kirke Corin of card is consulted.  Chest x-ray: 1. No acute airspace opacity. 2. Chronic hyperinflation and bilateral upper lobe bronchiectasis. 3. Right apical pleural-parenchymal scarring and region of cystic bronchiectasis, unchanged from prior radiographs and CT.  CTA of chest aorta: Mild atherosclerotic calcified plaque. No aortic dissection or aneurysm formation.   Once again there are several enlarged partially calcified lymph nodes in the hilum and mediastinum as well as reticulonodular changes the lungs with cavitary and non cavitary ill-defined areas of nodularity. Overall the distribution is similar to  previous examination. Please correlate for known history and recommend continued follow-up in 6-12 months to ensure long-term stability.   Aortic Atherosclerosis (ICD10-I70.0).       EKG: I have personally reviewed.  Sinus rhythm, QTc 450, low voltage, borderline left axis deviation   Review of Systems:   General: no fevers, chills, no body weight gain, fatigue HEENT: no blurry vision, hearing changes or sore throat Respiratory: no dyspnea, coughing, wheezing CV: has chest pain, no palpitations GI: no nausea, vomiting, abdominal pain, diarrhea, constipation GU: no dysuria, burning on urination, increased urinary frequency, hematuria  Ext: no leg edema Neuro: no unilateral weakness, numbness, or tingling, no vision change or hearing loss Skin: no rash, no skin tear. MSK: No muscle spasm, no deformity, no limitation of range of movement in spin Heme: No easy bruising.  Travel history: No recent long distant travel.   Allergy:  Allergies  Allergen Reactions   Shellfish Allergy     Edema    Past Medical History:  Diagnosis Date   Aortic atherosclerosis (HCC)    Asthma    Bruising    Cardiomyopathy (HCC)    Celiac artery aneurysm (HCC)    Cervical radiculopathy    CKD (chronic kidney disease), stage III (HCC)    Deficiency of vitamin B    Depression    Edema    Flank pain    Gross hematuria    History of 2019 novel coronavirus disease (COVID-19) 05/01/2019   History of bariatric surgery    HLD (hyperlipidemia)    HTN (hypertension)    Interstitial lung disease (HCC)  Iron deficiency anemia    Leukopenia    OA (osteoarthritis)    Obesity    OSA on CPAP    Osteopenia    Perennial allergic rhinitis    Pneumonia due to COVID-19 virus 2020   Renal cysts, acquired, bilateral    RLS (restless legs syndrome)    Sarcoidosis    Sickle cell trait (HCC)    Snoring    Spinal stenosis    Trigeminal neuralgia     Past Surgical History:  Procedure Laterality Date    BARIATRIC SURGERY     CESAREAN SECTION     3 or more   COLONOSCOPY WITH PROPOFOL N/A 03/15/2019   Procedure: COLONOSCOPY WITH PROPOFOL;  Surgeon: Toney Reil, MD;  Location: [facility] ENDOSCOPY;  Service: Gastroenterology;  Laterality: N/A;   COLONOSCOPY WITH PROPOFOL N/A 03/15/2022   Procedure: COLONOSCOPY WITH PROPOFOL;  Surgeon: Toney Reil, MD;  Location: [facility] ENDOSCOPY;  Service: Gastroenterology;  Laterality: N/A;   GASTRIC BYPASS     KNEE ARTHROPLASTY Right 10/18/2020   Procedure: COMPUTER ASSISTED TOTAL KNEE ARTHROPLASTY;  Surgeon: Donato Heinz, MD;  Location: [facility] ORS;  Service: Orthopedics;  Laterality: Right;   SHOULDER ARTHROSCOPY Right    TUBAL LIGATION      Social History:  reports that she has never smoked. She has never used smokeless tobacco. She reports that she does not currently use alcohol. She reports that she does not use drugs.  Family History:  Family History  Problem Relation Age of Onset   Hypercholesterolemia Mother    Heart disease Mother    Hypertension Mother    Alcohol abuse Father    Lung cancer Brother    Alcohol abuse Brother    Diabetes Mellitus II Sister    Hypertension Maternal Grandmother    Colon cancer Sister    Breast cancer Neg Hx    Prostate cancer Neg Hx    Bladder Cancer Neg Hx    Kidney cancer Neg Hx      Prior to Admission medications   Medication Sig Start Date End Date Taking? Authorizing Provider  Accu-Chek Softclix Lancets lancets USE LANCET MORNING AT [facility] AND AT BEDTIME 11/19/22   Alba Cory, MD  acetaminophen (TYLENOL) 650 MG CR tablet Take 1,300 mg by mouth every 8 (eight) hours as needed for pain.    [provider]  albuterol (VENTOLIN HFA) 108 (90 Base) MCG/ACT inhaler INHALE 1-2 PUFFS BY MOUTH EVERY 6 HOURS AS NEEDED FOR WHEEZE OR SHORTNESS OF BREATH 01/06/23   Carlynn Purl, Danna Hefty, MD  amLODipine (NORVASC) 5 MG tablet Take 5 mg by mouth daily.    [facility]  Ascorbic Acid  (VITAMIN C) 1000 MG tablet Take 1,000 mg by mouth daily.    [provider]  aspirin EC 81 MG tablet Take 1 tablet by mouth daily. 08/27/18   [provider]  atorvastatin (LIPITOR) 40 MG tablet TAKE 1 TABLET BY MOUTH EVERY DAY 11/11/22   Carlynn Purl, Danna Hefty, MD  azelastine (ASTELIN) 0.1 % nasal spray Place 2 sprays into both nostrils 2 (two) times daily. Use in each nostril as directed 12/07/21   Bevelyn Ngo, NP  Blood Glucose Monitoring Suppl DEVI 1 each by Does not apply route in the morning, at noon, and at bedtime. May substitute to any manufacturer covered by patient's insurance. 09/30/22   Alba Cory, MD  budesonide (PULMICORT) 0.5 MG/2ML nebulizer solution Take 2 mLs (0.5 mg total) by nebulization in the morning and at  bedtime. 12/07/21   Bevelyn Ngo, NP  calcium carbonate (TUMS - DOSED IN MG ELEMENTAL CALCIUM) 500 MG chewable tablet Chew 500-1,000 mg by mouth daily as needed for indigestion or heartburn.    [provider]  ezetimibe (ZETIA) 10 MG tablet TAKE 1 TABLET BY MOUTH EVERY DAY 01/06/23   Antonieta Iba, MD  ferrous sulfate 325 (65 FE) MG tablet Take 325 mg by mouth daily with breakfast.    [provider]  fluticasone (FLONASE) 50 MCG/ACT nasal spray SPRAY 2 SPRAYS INTO EACH NOSTRIL EVERY DAY 01/15/23   Erin Fulling, MD  gabapentin (NEURONTIN) 100 MG capsule Take 100-200 mg by mouth 2 (two) times daily. One in am and two at night 11/19/22   Morene Crocker, MD  glucose blood (ACCU-CHEK GUIDE) test strip 1 EACH BY IN VITRO ROUTE IN THE MORNING, AT NOON, AND AT BEDTIME. MAY SUBSTITUTE TO ANY MANUFACTURER COVERED BY PATIENT'S INSURANCE. 11/12/22   Carlynn Purl, Danna Hefty, MD  ipratropium-albuterol (DUONEB) 0.5-2.5 (3) MG/3ML SOLN Take 3 mLs by nebulization every 6 (six) hours as needed. 12/07/21   Bevelyn Ngo, NP  levocetirizine Elita Boone ALLERGY 24HR) 5 MG tablet Take 1 tablet (5 mg total) by mouth every evening. 01/16/22   Erin Fulling, MD  losartan (COZAAR)  50 MG tablet Take 50 mg by mouth daily. 08/07/22   [provider]  magnesium oxide (MAG-OX) 400 MG tablet Take 400 mg by mouth daily.    [provider]  metaxalone (SKELAXIN) 800 MG tablet Take 1 tablet (800 mg total) by mouth 3 (three) times daily as needed for muscle spasms. 02/08/22   Alba Cory, MD  Multiple Vitamins-Minerals (CENTRUM SILVER) tablet Take 1 tablet by mouth daily. 07/02/18   Alba Cory, MD  nortriptyline (PAMELOR) 10 MG capsule Take 10-20 mg by mouth at bedtime. 11/19/22   Morene Crocker, MD  Probiotic Product (FORTIFY PROBIOTIC WOMENS EX ST PO) Take by mouth daily.    [provider]  Dwyane Luo 200-62.5-25 MCG/ACT AEPB INHALE ONE ACTUATION INTO THE LUNGS DAILY 11/04/22   Erin Fulling, MD  zinc gluconate 50 MG tablet Take 50 mg by mouth daily.    [provider]    Physical Exam: Vitals:   01/17/23 1125 01/17/23 1229 01/17/23 1550  BP: (!) 148/79  133/88  Pulse: (!) 115  87  Resp: 17  14  Temp: 98.9 F (37.2 C)    TempSrc: Oral    SpO2: 98%  100%  Weight:  96.2 kg   Height:  6' (1.829 m)    General: Not in acute distress HEENT:       Eyes: PERRL, EOMI, no scleral icterus.       ENT: No discharge from the ears and nose, no pharynx injection, no tonsillar enlargement.        Neck: No JVD, no bruit, no mass felt. Heme: No neck lymph node enlargement. Cardiac: S1/S2, RRR, No murmurs, No gallops or rubs. Respiratory: No rales, wheezing, rhonchi or rubs. GI: Soft, nondistended, nontender, no rebound pain, no organomegaly, BS present. GU: No hematuria Ext: No pitting leg edema bilaterally. 1+DP/PT pulse bilaterally. Musculoskeletal: No joint deformities, No joint redness or warmth, no limitation of ROM in spin. Skin: No rashes.  Neuro: Alert, oriented X3, cranial nerves II-XII grossly intact, moves all extremities normally.  Psych: Patient is not psychotic, no suicidal or hemocidal ideation.  Labs on Admission: I  have personally reviewed following labs and imaging studies  CBC: Recent  Labs  Lab 01/17/23 1132  WBC 7.3  HGB 13.0  HCT 40.7  MCV 92.3  PLT 218   Basic Metabolic Panel: Recent Labs  Lab 01/17/23 1132  NA 138  K 4.3  CL 103  CO2 21*  GLUCOSE 168*  BUN 21  CREATININE 1.64*  CALCIUM 9.2   GFR: Estimated Creatinine Clearance: 42.1 mL/min (A) (by C-G formula based on SCr of 1.64 mg/dL (H)). Liver Function Tests: No results for input(s): "AST", "ALT", "ALKPHOS", "BILITOT", "PROT", "ALBUMIN" in the last 168 hours. No results for input(s): "LIPASE", "AMYLASE" in the last 168 hours. No results for input(s): "AMMONIA" in the last 168 hours. Coagulation Profile: No results for input(s): "INR", "PROTIME" in the last 168 hours. Cardiac Enzymes: No results for input(s): "CKTOTAL", "CKMB", "CKMBINDEX", "TROPONINI" in the last 168 hours. BNP (last 3 results) No results for input(s): "PROBNP" in the last 8760 hours. HbA1C: No results for input(s): "HGBA1C" in the last 72 hours. CBG: No results for input(s): "GLUCAP" in the last 168 hours. Lipid Profile: No results for input(s): "CHOL", "HDL", "LDLCALC", "TRIG", "CHOLHDL", "LDLDIRECT" in the last 72 hours. Thyroid Function Tests: No results for input(s): "TSH", "T4TOTAL", "FREET4", "T3FREE", "THYROIDAB" in the last 72 hours. Anemia Panel: No results for input(s): "VITAMINB12", "FOLATE", "FERRITIN", "TIBC", "IRON", "RETICCTPCT" in the last 72 hours. Urine analysis:    Component Value Date/Time   COLORURINE YELLOW (A) 10/10/2020 1359   APPEARANCEUR CLEAR (A) 10/10/2020 1359   APPEARANCEUR Cloudy (A) 02/22/2020 1520   LABSPEC 1.011 10/10/2020 1359   LABSPEC 1.014 04/15/2014 2119   PHURINE 5.0 10/10/2020 1359   GLUCOSEU NEGATIVE 10/10/2020 1359   GLUCOSEU Negative 04/15/2014 2119   HGBUR NEGATIVE 10/10/2020 1359   BILIRUBINUR NEGATIVE 10/10/2020 1359   BILIRUBINUR Negative 02/22/2020 1520   BILIRUBINUR Negative 04/15/2014 2119    KETONESUR NEGATIVE 10/10/2020 1359   PROTEINUR NEGATIVE 10/10/2020 1359   UROBILINOGEN 0.2 11/27/2018 1049   UROBILINOGEN 0.2 06/04/2010 1913   NITRITE NEGATIVE 10/10/2020 1359   LEUKOCYTESUR TRACE (A) 10/10/2020 1359   LEUKOCYTESUR 1+ 04/15/2014 2119   Sepsis Labs: @LABRCNTIP (procalcitonin:4,lacticidven:4) )No results found for this or any previous visit (from the past 240 hour(s)).   Radiological Exams on Admission: CT Angio Chest Aorta W and/or Wo Contrast  Result Date: 01/17/2023 CLINICAL DATA:  Acute onset chest pain after her water aerobics class. EXAM: CT ANGIOGRAPHY CHEST WITH CONTRAST TECHNIQUE: Multidetector CT imaging of the chest was performed using the standard protocol during bolus administration of intravenous contrast. Multiplanar CT image reconstructions and MIPs were obtained to evaluate the vascular anatomy. RADIATION DOSE REDUCTION: This exam was performed according to the departmental dose-optimization program which includes automated exposure control, adjustment of the mA and/or kV according to patient size and/or use of iterative reconstruction technique. CONTRAST:  60mL OMNIPAQUE IOHEXOL 350 MG/ML SOLN COMPARISON:  Chest x-ray 01/17/2023 earlier. Noncontrast chest CT 04/15/2022 and older. FINDINGS: Cardiovascular: No aortic dissection or aneurysm formation. Mild atherosclerotic plaque. Diameter of the ascending aorta proximally approaches 2.8 cm. Diameter of the mid ascending aorta at the level of the main pulmonary artery measures 3.4 x 3.5 cm. Diameter of the aortic arch approaches 2.3 cm. And diameter of the mid descending thoracic aorta measures 2.6 by 2.6 cm. Heart is nonenlarged. Trace pericardial fluid. Mediastinum/Nodes: Preserved thyroid gland. The esophagus has a normal course and caliber. No abnormal lymph node enlargement identified in the axillary regions. There are several enlarged mediastinal hilar nodes. Several these have calcifications. Example right hilar  node  which has a previous short axis of 19 mm, today when measured in the similar fashion measures 19 mm on series 4, image 72. Precarinal node previously has a diameter in short axis of 14 mm and today when measured in the same fashion on series 4, image 59 14 mm. Other nodes are also similar including subcarinal and left hilar. Lungs/Pleura: No pneumothorax or effusion. There are areas of reticular and nodular changes the lungs as well as some subtle ground-glass patchy areas and cavitary lesions. Extent and distribution is similar to previous. Example noncalcified non cavitary nodule left upper lobe which measured 4 mm, today measures 5 mm on series 6, image 52. Lesion seen more caudal in the left upper lobe trauma lingula which previously measured 8 mm in maximal dimension, today measures 8 mm on series 6, image 73. Upper Abdomen: Multilobular kidneys with the areas of atrophy and dilatation with cystic areas and stones. Surgical changes from gastric bypass. Musculoskeletal: Scattered degenerative changes along the spine. Diffuse scattered bridging endplate osteophytes and syndesmophytes. Review of the MIP images confirms the above findings. IMPRESSION: Mild atherosclerotic calcified plaque. No aortic dissection or aneurysm formation. Once again there are several enlarged partially calcified lymph nodes in the hilum and mediastinum as well as reticulonodular changes the lungs with cavitary and non cavitary ill-defined areas of nodularity. Overall the distribution is similar to previous examination. Please correlate for known history and recommend continued follow-up in 6-12 months to ensure long-term stability. Aortic Atherosclerosis (ICD10-I70.0). Electronically Signed   By: Karen Kays M.D.   On: 01/17/2023 14:48   DG Chest 2 View  Result Date: 01/17/2023 CLINICAL DATA:  Chest pain after water aerobics class. High midsternal pain. EXAM: CHEST - 2 VIEW COMPARISON:  Chest radiographs 09/10/2021 and  09/07/2019; CT chest 04/15/2022 FINDINGS: Cardiac silhouette and mediastinal contours within normal limits. There is again flattening of the diaphragms and moderate hyperinflation. There are lucencies within the bilateral upper lungs, consistent with a bronchiectasis seen on prior CT. No acute airspace opacity is seen. No pulmonary edema, pleural effusion, or pneumothorax. Right apical pleural-parenchymal scarring and region of cystic bronchiectasis on prior CT, unchanged from prior radiographs. Moderate multilevel degenerative disc changes of the thoracic spine. Left upper abdominal surgical clips. IMPRESSION: 1. No acute airspace opacity. 2. Chronic hyperinflation and bilateral upper lobe bronchiectasis. 3. Right apical pleural-parenchymal scarring and region of cystic bronchiectasis, unchanged from prior radiographs and CT. Electronically Signed   By: Neita Garnet M.D.   On: 01/17/2023 12:28      Assessment/Plan Principal Problem:   Chest pain Active Problems:   HTN (hypertension)   HLD (hyperlipidemia)   Stage 3b chronic kidney disease (HCC)   Sarcoidosis of lung (HCC)   Chronic bronchitis (HCC)   Asthma   Depression   OSA on CPAP   Assessment and Plan:  Chest pain:  trop is slightly elevated 33 --> 35.  Patient still has mild pain. CTA negative for dissection. Consulted Dr. Kirke Corin of cardiology.  - place to tele bedfor observation - Trend Trop - prn Nitroglycerin, Morphine, and aspirin, lipitor  - Risk factor stratification: will check FLP and A1C  - check UDS - 2d echo  HTN (hypertension) -IV hydralazine as needed -Amlodipine, Cozaar,  HLD (hyperlipidemia) -Lipitor and Zetia  Stage 3b chronic kidney disease (HCC): Stable -Follow-up with BMP  Sarcoidosis of lung (HCC) -Patient is following up with Dr Belia Heman of pulmonology per pt.  Chronic bronchitis (HCC) and Asthma -Pulm dilators  Depression -Continue  home medications  OSA - on CPAP  Abnormal findings by CTA:  CTA showed several enlarged partially calcified lymph nodes in the hilum and mediastinum as well as reticulonodular changes the lungs with cavitary and non cavitary ill-defined areas of nodularity. Overall the distribution is similar to previous examination.  -need to f/u with PCP to repeat image in 6-12 months      DVT ppx: SQ Lovenox  Code Status: Full code  Family Communication: not done, no family member is at bed side.     Disposition Plan:  Anticipate discharge back to previous environment  Consults called: Dr. Kirke Corin of card  Admission status and Level of care: Telemetry Cardiac:   for obs    Dispo: The patient is from: Home              Anticipated d/c is to: Home              Anticipated d/c date is: 1 day              Patient currently is not medically stable to d/c.    Severity of Illness:  The appropriate patient status for this patient is INPATIENT. Inpatient status is judged to be reasonable and necessary in order to provide the required intensity of service to ensure the patient's safety. The patient's presenting symptoms, physical exam findings, and initial radiographic and laboratory data in the context of their chronic comorbidities is felt to place them at high risk for further clinical deterioration. Furthermore, it is not anticipated that the patient will be medically stable for discharge from the hospital within 2 midnights of admission.   * I certify that at the point of admission it is my clinical judgment that the patient will require inpatient hospital care spanning beyond 2 midnights from the point of admission due to high intensity of service, high risk for further deterioration and high frequency of surveillance required.*       Date of Service 01/17/2023    Lorretta Harp [facility]   If 7PM-7AM, please contact night-coverage [email or web address] 01/17/2023, 4:20 PM

## 2023-01-17 NOTE — ED Provider Notes (Addendum)
[facility] Provider Note    Event Date/Time   First MD Initiated Contact with Patient 01/17/23 1222     (approximate)   History   Chest Pain   HPI  Melissa [name] is a 72 y.o. female with a history of chronic kidney disease, hypertension, sarcoidosis who presents with complaints of chest pressure.  Patient reports she completed water aerobics today and felt like her heart rate was still elevated while sitting in the car afterwards and then she developed chest pressure with some radiation to her back.  No history of CAD.  She does not smoke.  Takes losartan for blood pressure, she reports symptoms have improved but she still has a mild amount of chest pressure, no shortness of breath, no history of DVTs.     Physical Exam   Triage Vital Signs: ED Triage Vitals  Enc Vitals Group     BP 01/17/23 1125 (!) 148/79     Pulse Rate 01/17/23 1125 (!) 115     Resp 01/17/23 1125 17     Temp 01/17/23 1125 98.9 F (37.2 C)     Temp Source 01/17/23 1125 Oral     SpO2 01/17/23 1125 98 %     Weight 01/17/23 1229 96.2 kg (212 lb 1.3 oz)     Height 01/17/23 1229 1.829 m (6')     Head Circumference --      Peak Flow --      Pain Score 01/17/23 1126 2     Pain Loc --      Pain Edu? --      Excl. in GC? --     Most recent vital signs: Vitals:   01/17/23 1125  BP: (!) 148/79  Pulse: (!) 115  Resp: 17  Temp: 98.9 F (37.2 C)  SpO2: 98%     General: Awake, no distress.  CV:  Good peripheral perfusion.  Regular rate and rhythm Resp:  Normal effort.  Abd:  No distention.  Other:  No calf pain or swelling   ED Results / Procedures / Treatments   Labs (all labs ordered are listed, but only abnormal results are displayed) Labs Reviewed  BASIC METABOLIC PANEL - Abnormal; Notable for the following components:      Result Value   CO2 21 (*)    Glucose, Bld 168 (*)    Creatinine, Ser 1.64 (*)    GFR, Estimated 34 (*)    All other components within  normal limits  TROPONIN I (HIGH SENSITIVITY) - Abnormal; Notable for the following components:   Troponin I (High Sensitivity) 33 (*)    All other components within normal limits  CBC     EKG  ED ECG REPORT I, Jene Every, the attending physician, personally viewed and interpreted this ECG.  Date: 01/17/2023 Triage Rhythm: Sinus tachycardia QRS Axis: normal Intervals: normal ST/T Wave abnormalities: normal Narrative Interpretation: no evidence of acute ischemia  ED ECG REPORT #2 I, Jene Every, the attending physician, personally viewed and interpreted this ECG.  Date: 01/17/2023 EKG Time: 1:19 PM Rate: 96 Rhythm: normal sinus rhythm QRS Axis: normal Intervals: normal ST/T Wave abnormalities: normal Narrative Interpretation: no evidence of acute ischemia   RADIOLOGY Chest x-ray viewed interpret by me, no acute abnormality    PROCEDURES:  Critical Care performed: yes  CRITICAL CARE Performed by: Jene Every   Total critical care time: 30 minutes  Critical care time was exclusive of separately billable procedures and treating other patients.  Critical care was necessary to treat or prevent imminent or life-threatening deterioration.  Critical care was time spent personally by me on the following activities: development of treatment plan with patient and/or surrogate as well as nursing, discussions with consultants, evaluation of patient's response to treatment, examination of patient, obtaining history from patient or surrogate, ordering and performing treatments and interventions, ordering and review of laboratory studies, ordering and review of radiographic studies, pulse oximetry and re-evaluation of patient's condition.   Procedures   MEDICATIONS ORDERED IN ED: Medications  sodium chloride 0.9 % bolus 500 mL (500 mLs Intravenous New Bag/Given 01/17/23 1341)  aspirin chewable tablet 324 mg (324 mg Oral Given 01/17/23 1249)  iohexol (OMNIPAQUE) 350  MG/ML injection 60 mL (60 mLs Intravenous Contrast Given 01/17/23 1355)     IMPRESSION / MDM / ASSESSMENT AND PLAN / ED COURSE  I reviewed the triage vital signs and the nursing notes. Patient's presentation is most consistent with acute presentation with potential threat to life or bodily function.  Patient with a history of high blood pressure presents with complaints of chest pressure with some radiation to the back.  This occurred after exertion.  Differential includes ACS, angina, less likely PE versus dissection  Tachycardic upon arrival but this resolved with rest.  Symptoms improved but still some chest discomfort.  EKG overall reassuring  High sensitive troponin is elevated at 33  Will send for CT angiography to evaluate aorta, discussed with patient she has no history of IV contrast allergy  Aspirin 325  Will admit to the hospitalist service given chest pain status post exertion with elevated troponin        FINAL CLINICAL IMPRESSION(S) / ED DIAGNOSES   Final diagnoses:  Chest pain, unspecified type  Elevated troponin     Rx / DC Orders   ED Discharge Orders     None        Note:  This document was prepared using Dragon voice recognition software and may include unintentional dictation errors.   Jene Every, MD 01/17/23 1438    Jene Every, MD 01/17/23 [phone]

## 2023-01-17 NOTE — ED Notes (Signed)
Two unsuccessful IV attempts by this RN. Will defer to another RN.

## 2023-01-17 NOTE — ED Triage Notes (Signed)
Pt presents to ED with c/o of CP after her water aerobics class. Pt not sure of if pain started during aerobics. Pt is A&Ox4.

## 2023-01-18 ENCOUNTER — Observation Stay (HOSPITAL_COMMUNITY)
Admit: 2023-01-18 | Discharge: 2023-01-18 | Disposition: A | Discharge status: Home / Self Care | Payer: Medicare Other | Attending: Medical | Admitting: Medical

## 2023-01-18 DIAGNOSIS — Z7951 Long term (current) use of inhaled steroids: Secondary | ICD-10-CM | POA: Diagnosis not present

## 2023-01-18 DIAGNOSIS — R7989 Other specified abnormal findings of blood chemistry: Secondary | ICD-10-CM

## 2023-01-18 DIAGNOSIS — G2581 Restless legs syndrome: Secondary | ICD-10-CM | POA: Diagnosis present

## 2023-01-18 DIAGNOSIS — J4489 Other specified chronic obstructive pulmonary disease: Secondary | ICD-10-CM | POA: Diagnosis present

## 2023-01-18 DIAGNOSIS — Z79899 Other long term (current) drug therapy: Secondary | ICD-10-CM | POA: Diagnosis not present

## 2023-01-18 DIAGNOSIS — I428 Other cardiomyopathies: Secondary | ICD-10-CM | POA: Diagnosis present

## 2023-01-18 DIAGNOSIS — N1832 Chronic kidney disease, stage 3b: Secondary | ICD-10-CM | POA: Diagnosis present

## 2023-01-18 DIAGNOSIS — M8580 Other specified disorders of bone density and structure, unspecified site: Secondary | ICD-10-CM | POA: Diagnosis present

## 2023-01-18 DIAGNOSIS — G8929 Other chronic pain: Secondary | ICD-10-CM | POA: Diagnosis present

## 2023-01-18 DIAGNOSIS — R079 Chest pain, unspecified: Secondary | ICD-10-CM | POA: Diagnosis not present

## 2023-01-18 DIAGNOSIS — J479 Bronchiectasis, uncomplicated: Secondary | ICD-10-CM | POA: Diagnosis present

## 2023-01-18 DIAGNOSIS — Z9884 Bariatric surgery status: Secondary | ICD-10-CM | POA: Diagnosis not present

## 2023-01-18 DIAGNOSIS — Z9989 Dependence on other enabling machines and devices: Secondary | ICD-10-CM | POA: Diagnosis not present

## 2023-01-18 DIAGNOSIS — N189 Chronic kidney disease, unspecified: Secondary | ICD-10-CM | POA: Diagnosis not present

## 2023-01-18 DIAGNOSIS — G4733 Obstructive sleep apnea (adult) (pediatric): Secondary | ICD-10-CM | POA: Diagnosis present

## 2023-01-18 DIAGNOSIS — Z7722 Contact with and (suspected) exposure to environmental tobacco smoke (acute) (chronic): Secondary | ICD-10-CM | POA: Diagnosis present

## 2023-01-18 DIAGNOSIS — I200 Unstable angina: Principal | ICD-10-CM

## 2023-01-18 DIAGNOSIS — D573 Sickle-cell trait: Secondary | ICD-10-CM | POA: Diagnosis present

## 2023-01-18 DIAGNOSIS — D860 Sarcoidosis of lung: Secondary | ICD-10-CM | POA: Diagnosis present

## 2023-01-18 DIAGNOSIS — I10 Essential (primary) hypertension: Secondary | ICD-10-CM | POA: Diagnosis not present

## 2023-01-18 DIAGNOSIS — Z7982 Long term (current) use of aspirin: Secondary | ICD-10-CM | POA: Diagnosis not present

## 2023-01-18 DIAGNOSIS — I700 Atherosclerosis of aorta: Secondary | ICD-10-CM | POA: Diagnosis present

## 2023-01-18 DIAGNOSIS — R000 Tachycardia, unspecified: Secondary | ICD-10-CM | POA: Diagnosis present

## 2023-01-18 DIAGNOSIS — E785 Hyperlipidemia, unspecified: Secondary | ICD-10-CM | POA: Diagnosis present

## 2023-01-18 DIAGNOSIS — R0789 Other chest pain: Secondary | ICD-10-CM | POA: Diagnosis not present

## 2023-01-18 DIAGNOSIS — Z8616 Personal history of COVID-19: Secondary | ICD-10-CM | POA: Diagnosis not present

## 2023-01-18 DIAGNOSIS — F32A Depression, unspecified: Secondary | ICD-10-CM | POA: Diagnosis present

## 2023-01-18 DIAGNOSIS — I129 Hypertensive chronic kidney disease with stage 1 through stage 4 chronic kidney disease, or unspecified chronic kidney disease: Secondary | ICD-10-CM | POA: Diagnosis present

## 2023-01-18 LAB — LIPID PANEL
Cholesterol: 134 mg/dL (ref 0–200)
HDL: 72 mg/dL (ref >40)
LDL Cholesterol: 54 mg/dL (ref 0–99)
Total CHOL/HDL Ratio: 1.9 RATIO
Triglycerides: 42 mg/dL (ref <150)
VLDL: 8 mg/dL (ref 0–40)

## 2023-01-18 LAB — CBC
HCT: 35.4 % — ABNORMAL LOW (ref 36.0–46.0)
Hemoglobin: 11.6 g/dL — ABNORMAL LOW (ref 12.0–15.0)
MCH: 29.9 pg (ref 26.0–34.0)
MCHC: 32.8 g/dL (ref 30.0–36.0)
MCV: 91.2 fL (ref 80.0–100.0)
Platelets: 185 10*3/uL (ref 150–400)
RBC: 3.88 MIL/uL (ref 3.87–5.11)
RDW: 12.5 % (ref 11.5–15.5)
WBC: 3.9 10*3/uL — ABNORMAL LOW (ref 4.0–10.5)
nRBC: 0 % (ref 0.0–0.2)

## 2023-01-18 LAB — ECHOCARDIOGRAM COMPLETE
Height: 72 in
Weight: 3393.32 oz

## 2023-01-18 LAB — BASIC METABOLIC PANEL
Anion gap: 7 (ref 5–15)
BUN: 17 mg/dL (ref 8–23)
CO2: 25 mmol/L (ref 22–32)
Calcium: 9.1 mg/dL (ref 8.9–10.3)
Chloride: 106 mmol/L (ref 98–111)
Creatinine, Ser: 1.43 mg/dL — ABNORMAL HIGH (ref 0.44–1.00)
GFR, Estimated: 40 mL/min — ABNORMAL LOW (ref >60)
Glucose, Bld: 90 mg/dL (ref 70–99)
Potassium: 4.4 mmol/L (ref 3.5–5.1)
Sodium: 138 mmol/L (ref 135–145)

## 2023-01-18 MED ORDER — LORATADINE 10 MG PO TABS
10.0000 mg | ORAL_TABLET | Freq: Every day | ORAL | Status: DC
  Administered 2023-01-18 – 2023-01-20 (×3): 10 mg via ORAL
  Filled 2023-01-18 (×3): qty 1

## 2023-01-18 NOTE — Progress Notes (Signed)
Rounding Note    Patient Name: Melissa [name] Date of Encounter: 01/18/2023  [facility] Cardiologist: None   Subjective   Doing well today.  Denies chest pain or shortness of breath.  Inpatient Medications    Scheduled Meds:  amLODipine  5 mg Oral Daily   vitamin C  1,000 mg Oral Daily   aspirin EC  81 mg Oral Daily   atorvastatin  40 mg Oral Daily   budesonide  0.5 mg Nebulization BID   enoxaparin (LOVENOX) injection  40 mg Subcutaneous Q24H   ezetimibe  10 mg Oral Daily   ferrous sulfate  325 mg Oral Q breakfast   fluticasone furoate-vilanterol  1 puff Inhalation Daily   And   umeclidinium bromide  1 puff Inhalation Daily   gabapentin  100 mg Oral Q2000   gabapentin  200 mg Oral QHS   loratadine  10 mg Oral Daily   losartan  50 mg Oral Daily   magnesium oxide  400 mg Oral Daily   multivitamin with minerals  1 tablet Oral Daily   nortriptyline  10-20 mg Oral QHS   saccharomyces boulardii  250 mg Oral Daily   zinc sulfate  220 mg Oral Daily   Continuous Infusions:  PRN Meds: acetaminophen, albuterol, dextromethorphan-guaiFENesin, hydrALAZINE, methocarbamol, morphine injection, nitroGLYCERIN, ondansetron (ZOFRAN) IV, oxyCODONE-acetaminophen   Vital Signs    Vitals:   01/18/23 0403 01/18/23 0541 01/18/23 1011 01/18/23 1015  BP: 111/64 117/85 124/82 124/82  Pulse: 84 92  68  Resp: 18 20  20   Temp:      TempSrc:      SpO2: 99% 97%  98%  Weight:      Height:        Intake/Output Summary (Last 24 hours) at 01/18/2023 1122 Last data filed at 01/17/2023 1851 Gross per 24 hour  Intake 500 ml  Output --  Net 500 ml      01/17/2023   12:29 PM 11/19/2022   11:01 AM 10/31/2022   11:31 AM  Last 3 Weights  Weight (lbs) 212 lb 1.3 oz 212 lb 218 lb  Weight (kg) 96.2 kg 96.163 kg 98.884 kg      Telemetry    Sinus rhythm.  No events.- Personally Reviewed  ECG    Sinus tachycardia.  Rate 119 bpm.  - Personally Reviewed  Physical Exam   VS:  BP  124/82   Pulse 68   Temp 98.6 F (37 C)   Resp 20   Ht 6' (1.829 m)   Wt 96.2 kg   SpO2 98%   BMI 28.76 kg/m  , BMI Body mass index is 28.76 kg/m. GENERAL:  Well appearing HEENT: Pupils equal round and reactive, fundi not visualized, oral mucosa unremarkable NECK:  No jugular venous distention, waveform within normal limits, carotid upstroke brisk and symmetric, no bruits, no thyromegaly LUNGS:  Clear to auscultation bilaterally HEART:  RRR.  PMI not displaced or sustained,S1 and S2 within normal limits, no S3, no S4, no clicks, no rubs, no murmurs ABD:  Flat, positive bowel sounds normal in frequency in pitch, no bruits, no rebound, no guarding, no midline pulsatile mass, no hepatomegaly, no splenomegaly EXT:  2 plus pulses throughout, no edema, no cyanosis no clubbing SKIN:  No rashes no nodules NEURO:  Cranial nerves II through XII grossly intact, motor grossly intact throughout PSYCH:  Cognitively intact, oriented to person place and time   Labs    High Sensitivity Troponin:  Recent Labs  Lab 01/17/23 1132 01/17/23 1551 01/17/23 1859 01/17/23 2056  TROPONINIHS 33* 35* 27* 28*     Chemistry Recent Labs  Lab 01/17/23 1132 01/18/23 0531  NA 138 138  K 4.3 4.4  CL 103 106  CO2 21* 25  GLUCOSE 168* 90  BUN 21 17  CREATININE 1.64* 1.43*  CALCIUM 9.2 9.1  GFRNONAA 34* 40*  ANIONGAP 14 7    Lipids  Recent Labs  Lab 01/18/23 0531  CHOL 134  TRIG 42  HDL 72  LDLCALC 54  CHOLHDL 1.9    Hematology Recent Labs  Lab 01/17/23 1132 01/18/23 0531  WBC 7.3 3.9*  RBC 4.41 3.88  HGB 13.0 11.6*  HCT 40.7 35.4*  MCV 92.3 91.2  MCH 29.5 29.9  MCHC 31.9 32.8  RDW 12.4 12.5  PLT 218 185   Thyroid No results for input(s): "TSH", "FREET4" in the last 168 hours.  BNPNo results for input(s): "BNP", "PROBNP" in the last 168 hours.  DDimer No results for input(s): "DDIMER" in the last 168 hours.   Radiology    CT Angio Chest Aorta W and/or Wo Contrast  Result  Date: 01/17/2023 CLINICAL DATA:  Acute onset chest pain after her water aerobics class. EXAM: CT ANGIOGRAPHY CHEST WITH CONTRAST TECHNIQUE: Multidetector CT imaging of the chest was performed using the standard protocol during bolus administration of intravenous contrast. Multiplanar CT image reconstructions and MIPs were obtained to evaluate the vascular anatomy. RADIATION DOSE REDUCTION: This exam was performed according to the departmental dose-optimization program which includes automated exposure control, adjustment of the mA and/or kV according to patient size and/or use of iterative reconstruction technique. CONTRAST:  60mL OMNIPAQUE IOHEXOL 350 MG/ML SOLN COMPARISON:  Chest x-ray 01/17/2023 earlier. Noncontrast chest CT 04/15/2022 and older. FINDINGS: Cardiovascular: No aortic dissection or aneurysm formation. Mild atherosclerotic plaque. Diameter of the ascending aorta proximally approaches 2.8 cm. Diameter of the mid ascending aorta at the level of the main pulmonary artery measures 3.4 x 3.5 cm. Diameter of the aortic arch approaches 2.3 cm. And diameter of the mid descending thoracic aorta measures 2.6 by 2.6 cm. Heart is nonenlarged. Trace pericardial fluid. Mediastinum/Nodes: Preserved thyroid gland. The esophagus has a normal course and caliber. No abnormal lymph node enlargement identified in the axillary regions. There are several enlarged mediastinal hilar nodes. Several these have calcifications. Example right hilar node which has a previous short axis of 19 mm, today when measured in the similar fashion measures 19 mm on series 4, image 72. Precarinal node previously has a diameter in short axis of 14 mm and today when measured in the same fashion on series 4, image 59 14 mm. Other nodes are also similar including subcarinal and left hilar. Lungs/Pleura: No pneumothorax or effusion. There are areas of reticular and nodular changes the lungs as well as some subtle ground-glass patchy areas and  cavitary lesions. Extent and distribution is similar to previous. Example noncalcified non cavitary nodule left upper lobe which measured 4 mm, today measures 5 mm on series 6, image 52. Lesion seen more caudal in the left upper lobe trauma lingula which previously measured 8 mm in maximal dimension, today measures 8 mm on series 6, image 73. Upper Abdomen: Multilobular kidneys with the areas of atrophy and dilatation with cystic areas and stones. Surgical changes from gastric bypass. Musculoskeletal: Scattered degenerative changes along the spine. Diffuse scattered bridging endplate osteophytes and syndesmophytes. Review of the MIP images confirms the above findings. IMPRESSION: Mild atherosclerotic  calcified plaque. No aortic dissection or aneurysm formation. Once again there are several enlarged partially calcified lymph nodes in the hilum and mediastinum as well as reticulonodular changes the lungs with cavitary and non cavitary ill-defined areas of nodularity. Overall the distribution is similar to previous examination. Please correlate for known history and recommend continued follow-up in 6-12 months to ensure long-term stability. Aortic Atherosclerosis (ICD10-I70.0). Electronically Signed   By: Karen Kays M.D.   On: 01/17/2023 14:48   DG Chest 2 View  Result Date: 01/17/2023 CLINICAL DATA:  Chest pain after water aerobics class. High midsternal pain. EXAM: CHEST - 2 VIEW COMPARISON:  Chest radiographs 09/10/2021 and 09/07/2019; CT chest 04/15/2022 FINDINGS: Cardiac silhouette and mediastinal contours within normal limits. There is again flattening of the diaphragms and moderate hyperinflation. There are lucencies within the bilateral upper lungs, consistent with a bronchiectasis seen on prior CT. No acute airspace opacity is seen. No pulmonary edema, pleural effusion, or pneumothorax. Right apical pleural-parenchymal scarring and region of cystic bronchiectasis on prior CT, unchanged from prior  radiographs. Moderate multilevel degenerative disc changes of the thoracic spine. Left upper abdominal surgical clips. IMPRESSION: 1. No acute airspace opacity. 2. Chronic hyperinflation and bilateral upper lobe bronchiectasis. 3. Right apical pleural-parenchymal scarring and region of cystic bronchiectasis, unchanged from prior radiographs and CT. Electronically Signed   By: Neita Garnet M.D.   On: 01/17/2023 12:28    Cardiac Studies   Echo pending  Patient Profile     72 y.o. female with pulmonary sarcoid, aortic atherosclerosis, s/p gastric bypass surgery and NICM with recovered LVEF admitted with chest pressure, tachycardia and mildly elevated troponin.  Assessment & Plan    # Chest pain: # Elevated troponin: Patient had mild CP and elevated cardiac enzymes.  She had sinus tachycardia on admission.  She is currently chest pain free.  Echo is pending.  Planning for [facility] on Monday.  Renal function is improved today compared with yesterday.  Will continue to monitor.     # Tachycardia:  Tachycardia has resolved.  Will check TSH.  # Aortic atherosclerosis:  # Hyperlipidemia:  Continue aspirin, atorvastatin and Zetia.  # Hypertension:  Continue amlodipine and losartan.       For questions or updates, please contact [facility] Please consult [email or web address] for contact info under        Signed, Chilton Si, MD  01/18/2023, 11:22 AM

## 2023-01-18 NOTE — Progress Notes (Signed)
PROGRESS NOTE    Melissa [name]  ZOX:[id number] DOB: 1951/04/27 DOA: 01/17/2023 PCP: Alba Cory, MD   Brief Narrative:  This 72 years old female with PMH significant for hypertension, hyperlipidemia, asthma, OSA on CPAP, CKD stage IIIb, sarcoidosis, sickle cell trait, s/p Roxanne Y gastric bypass, depression presented in the ED with complaints of chest pain.  Patient has completed water aerobics training and felt like her heart was persistently elevated.  She denies any palpitation or feeling of heart racing.  Later in the day she has developed chest pain on the right side pressure-like radiating towards the back.  ED workup reveals elevated troponins stable renal functions.  CTA negative for dissection.  Patient admitted for further evaluation,  Cardiology is consulted.  Patient is scheduled for left heart cath on Monday.  Assessment & Plan:   Principal Problem:   Chest pain Active Problems:   HTN (hypertension)   HLD (hyperlipidemia)   Stage 3b chronic kidney disease (HCC)   Sarcoidosis of lung (HCC)   Chronic bronchitis (HCC)   Asthma   Depression   OSA on CPAP   Unstable angina (HCC)   Elevated troponin  Chest pain: Patient presented with chest pain following water aerobic training. Troponin slightly elevated at 33 > 35 > 28 Patient still has mild chest pain.  CTA negative for dissection.  Cardiology is consulted. Continue morphine, aspirin, Lipitor and nitroglycerin as needed. Cardiology evaluated and recommended left heart cath on Monday. Obtain 2D echocardiogram.  Essential hypertension: Continue amlodipine, Cozaar IV hydralazine as needed.  Hyperlipidemia Continue Lipitor and Zetia.  CKD stage IIIb. Serum creatinine at baseline.   Avoid nephrotoxic medications.  Sarcoidosis of lung: Patient is following with Dr. Belia Heman pulmonology as an outpatient. Outpatient follow up.  Chronic bronchitis and asthma: Continue as needed  bronchodilators.  Depression: Continue home medications  OSA on CPAP.  Abnormal findings on CT chest: Follow-up outpatient with pulmonologist  DVT prophylaxis: Lovenox Code Status: Full code. Family Communication: No family at bed side. Disposition Plan:   Status is: Observation The patient remains OBS appropriate and will d/c before 2 midnights.  Admitted for chest pain,  suspected NSTEMI.  He is requiring left heart cath on Monday.   Consultants:  Cardiology  Procedures: Scheduled for left heart cath on Monday  Antimicrobials: Anti-infectives (From admission, onward)    None      Subjective: Patient was seen and examined at bedside.  Overnight events noted.   She reports chest pain has resolved.  She is doing better.  Objective: Vitals:   01/18/23 0403 01/18/23 0541 01/18/23 1011 01/18/23 1015  BP: 111/64 117/85 124/82 124/82  Pulse: 84 92  68  Resp: 18 20  20   Temp:      TempSrc:      SpO2: 99% 97%  98%  Weight:      Height:        Intake/Output Summary (Last 24 hours) at 01/18/2023 1321 Last data filed at 01/17/2023 1851 Gross per 24 hour  Intake 500 ml  Output --  Net 500 ml   Filed Weights   01/17/23 1229  Weight: 96.2 kg    Examination:  General exam: Appears calm and comfortable, not in any acute distress.  Deconditioned Respiratory system: Clear to auscultation. Respiratory effort normal.  RR 13 Cardiovascular system: S1 & S2 heard, RRR. No JVD, murmurs, rubs, gallops or clicks. No pedal edema. Gastrointestinal system: Abdomen is soft, non tender, non distended, BS+ Central nervous system: Alert and  oriented x 3. No focal neurological deficits. Extremities: No edema, No cyanosis, No clubbing Skin: No rashes, lesions or ulcers Psychiatry: Judgement and insight appear normal. Mood & affect appropriate.     Data Reviewed: I have personally reviewed following labs and imaging studies  CBC: Recent Labs  Lab 01/17/23 1132 01/18/23 0531   WBC 7.3 3.9*  HGB 13.0 11.6*  HCT 40.7 35.4*  MCV 92.3 91.2  PLT 218 185   Basic Metabolic Panel: Recent Labs  Lab 01/17/23 1132 01/18/23 0531  NA 138 138  K 4.3 4.4  CL 103 106  CO2 21* 25  GLUCOSE 168* 90  BUN 21 17  CREATININE 1.64* 1.43*  CALCIUM 9.2 9.1   GFR: Estimated Creatinine Clearance: 48.2 mL/min (A) (by C-G formula based on SCr of 1.43 mg/dL (H)). Liver Function Tests: No results for input(s): "AST", "ALT", "ALKPHOS", "BILITOT", "PROT", "ALBUMIN" in the last 168 hours. No results for input(s): "LIPASE", "AMYLASE" in the last 168 hours. No results for input(s): "AMMONIA" in the last 168 hours. Coagulation Profile: Recent Labs  Lab 01/17/23 1551  INR 1.0   Cardiac Enzymes: No results for input(s): "CKTOTAL", "CKMB", "CKMBINDEX", "TROPONINI" in the last 168 hours. BNP (last 3 results) No results for input(s): "PROBNP" in the last 8760 hours. HbA1C: No results for input(s): "HGBA1C" in the last 72 hours. CBG: No results for input(s): "GLUCAP" in the last 168 hours. Lipid Profile: Recent Labs    01/18/23 0531  CHOL 134  HDL 72  LDLCALC 54  TRIG 42  CHOLHDL 1.9   Thyroid Function Tests: No results for input(s): "TSH", "T4TOTAL", "FREET4", "T3FREE", "THYROIDAB" in the last 72 hours. Anemia Panel: No results for input(s): "VITAMINB12", "FOLATE", "FERRITIN", "TIBC", "IRON", "RETICCTPCT" in the last 72 hours. Sepsis Labs: No results for input(s): "PROCALCITON", "LATICACIDVEN" in the last 168 hours.  No results found for this or any previous visit (from the past 240 hour(s)).   Radiology Studies: CT Angio Chest Aorta W and/or Wo Contrast  Result Date: 01/17/2023 CLINICAL DATA:  Acute onset chest pain after her water aerobics class. EXAM: CT ANGIOGRAPHY CHEST WITH CONTRAST TECHNIQUE: Multidetector CT imaging of the chest was performed using the standard protocol during bolus administration of intravenous contrast. Multiplanar CT image reconstructions  and MIPs were obtained to evaluate the vascular anatomy. RADIATION DOSE REDUCTION: This exam was performed according to the departmental dose-optimization program which includes automated exposure control, adjustment of the mA and/or kV according to patient size and/or use of iterative reconstruction technique. CONTRAST:  60mL OMNIPAQUE IOHEXOL 350 MG/ML SOLN COMPARISON:  Chest x-ray 01/17/2023 earlier. Noncontrast chest CT 04/15/2022 and older. FINDINGS: Cardiovascular: No aortic dissection or aneurysm formation. Mild atherosclerotic plaque. Diameter of the ascending aorta proximally approaches 2.8 cm. Diameter of the mid ascending aorta at the level of the main pulmonary artery measures 3.4 x 3.5 cm. Diameter of the aortic arch approaches 2.3 cm. And diameter of the mid descending thoracic aorta measures 2.6 by 2.6 cm. Heart is nonenlarged. Trace pericardial fluid. Mediastinum/Nodes: Preserved thyroid gland. The esophagus has a normal course and caliber. No abnormal lymph node enlargement identified in the axillary regions. There are several enlarged mediastinal hilar nodes. Several these have calcifications. Example right hilar node which has a previous short axis of 19 mm, today when measured in the similar fashion measures 19 mm on series 4, image 72. Precarinal node previously has a diameter in short axis of 14 mm and today when measured in the same fashion on  series 4, image 59 14 mm. Other nodes are also similar including subcarinal and left hilar. Lungs/Pleura: No pneumothorax or effusion. There are areas of reticular and nodular changes the lungs as well as some subtle ground-glass patchy areas and cavitary lesions. Extent and distribution is similar to previous. Example noncalcified non cavitary nodule left upper lobe which measured 4 mm, today measures 5 mm on series 6, image 52. Lesion seen more caudal in the left upper lobe trauma lingula which previously measured 8 mm in maximal dimension, today  measures 8 mm on series 6, image 73. Upper Abdomen: Multilobular kidneys with the areas of atrophy and dilatation with cystic areas and stones. Surgical changes from gastric bypass. Musculoskeletal: Scattered degenerative changes along the spine. Diffuse scattered bridging endplate osteophytes and syndesmophytes. Review of the MIP images confirms the above findings. IMPRESSION: Mild atherosclerotic calcified plaque. No aortic dissection or aneurysm formation. Once again there are several enlarged partially calcified lymph nodes in the hilum and mediastinum as well as reticulonodular changes the lungs with cavitary and non cavitary ill-defined areas of nodularity. Overall the distribution is similar to previous examination. Please correlate for known history and recommend continued follow-up in 6-12 months to ensure long-term stability. Aortic Atherosclerosis (ICD10-I70.0). Electronically Signed   By: Karen Kays M.D.   On: 01/17/2023 14:48   DG Chest 2 View  Result Date: 01/17/2023 CLINICAL DATA:  Chest pain after water aerobics class. High midsternal pain. EXAM: CHEST - 2 VIEW COMPARISON:  Chest radiographs 09/10/2021 and 09/07/2019; CT chest 04/15/2022 FINDINGS: Cardiac silhouette and mediastinal contours within normal limits. There is again flattening of the diaphragms and moderate hyperinflation. There are lucencies within the bilateral upper lungs, consistent with a bronchiectasis seen on prior CT. No acute airspace opacity is seen. No pulmonary edema, pleural effusion, or pneumothorax. Right apical pleural-parenchymal scarring and region of cystic bronchiectasis on prior CT, unchanged from prior radiographs. Moderate multilevel degenerative disc changes of the thoracic spine. Left upper abdominal surgical clips. IMPRESSION: 1. No acute airspace opacity. 2. Chronic hyperinflation and bilateral upper lobe bronchiectasis. 3. Right apical pleural-parenchymal scarring and region of cystic bronchiectasis,  unchanged from prior radiographs and CT. Electronically Signed   By: Neita Garnet M.D.   On: 01/17/2023 12:28    Scheduled Meds:  amLODipine  5 mg Oral Daily   vitamin C  1,000 mg Oral Daily   aspirin EC  81 mg Oral Daily   atorvastatin  40 mg Oral Daily   budesonide  0.5 mg Nebulization BID   enoxaparin (LOVENOX) injection  40 mg Subcutaneous Q24H   ezetimibe  10 mg Oral Daily   ferrous sulfate  325 mg Oral Q breakfast   fluticasone furoate-vilanterol  1 puff Inhalation Daily   And   umeclidinium bromide  1 puff Inhalation Daily   gabapentin  100 mg Oral Q2000   gabapentin  200 mg Oral QHS   loratadine  10 mg Oral Daily   losartan  50 mg Oral Daily   magnesium oxide  400 mg Oral Daily   multivitamin with minerals  1 tablet Oral Daily   nortriptyline  10-20 mg Oral QHS   saccharomyces boulardii  250 mg Oral Daily   zinc sulfate  220 mg Oral Daily   Continuous Infusions:   LOS: 0 days    Time spent: 50 mins    Willeen Niece, MD [facility]   If 7PM-7AM, please contact night-coverage

## 2023-01-18 NOTE — Progress Notes (Signed)
  Echocardiogram 2D Echocardiogram has been performed.  Melissa [name] 01/18/2023, 3:00 PM

## 2023-01-19 DIAGNOSIS — E785 Hyperlipidemia, unspecified: Secondary | ICD-10-CM | POA: Diagnosis not present

## 2023-01-19 DIAGNOSIS — R079 Chest pain, unspecified: Secondary | ICD-10-CM | POA: Diagnosis not present

## 2023-01-19 DIAGNOSIS — R7989 Other specified abnormal findings of blood chemistry: Secondary | ICD-10-CM | POA: Diagnosis not present

## 2023-01-19 LAB — ECHOCARDIOGRAM COMPLETE
AV Peak grad: 7.8 mmHg
Ao pk vel: 1.4 m/s
Area-P 1/2: 3.45 cm2
S' Lateral: 3 cm

## 2023-01-19 LAB — TSH: TSH: 4.208 u[IU]/mL (ref 0.350–4.500)

## 2023-01-19 MED ORDER — SODIUM CHLORIDE 0.9% FLUSH
3.0000 mL | Freq: Two times a day (BID) | INTRAVENOUS | Status: DC
  Administered 2023-01-19 – 2023-01-20 (×3): 3 mL via INTRAVENOUS

## 2023-01-19 NOTE — H&P (View-Only) (Signed)
Rounding Note    Patient Name: Melissa [name] Date of Encounter: 01/19/2023  [facility] Cardiologist: NEw  Subjective   The patient reports dull chest pain. Breathing is good. Plan for heart cath tomorrow.  Inpatient Medications    Scheduled Meds:  amLODipine  5 mg Oral Daily   vitamin C  1,000 mg Oral Daily   aspirin EC  81 mg Oral Daily   atorvastatin  40 mg Oral Daily   budesonide  0.5 mg Nebulization BID   enoxaparin (LOVENOX) injection  40 mg Subcutaneous Q24H   ezetimibe  10 mg Oral Daily   ferrous sulfate  325 mg Oral Q breakfast   fluticasone furoate-vilanterol  1 puff Inhalation Daily   And   umeclidinium bromide  1 puff Inhalation Daily   gabapentin  100 mg Oral Q2000   gabapentin  200 mg Oral QHS   loratadine  10 mg Oral Daily   losartan  50 mg Oral Daily   magnesium oxide  400 mg Oral Daily   multivitamin with minerals  1 tablet Oral Daily   nortriptyline  10-20 mg Oral QHS   saccharomyces boulardii  250 mg Oral Daily   zinc sulfate  220 mg Oral Daily   Continuous Infusions:  PRN Meds: acetaminophen, albuterol, dextromethorphan-guaiFENesin, hydrALAZINE, methocarbamol, morphine injection, nitroGLYCERIN, ondansetron (ZOFRAN) IV, oxyCODONE-acetaminophen   Vital Signs    Vitals:   01/18/23 2010 01/19/23 0044 01/19/23 0504 01/19/23 0719  BP:  117/72 107/65   Pulse:  69 74   Resp:  18 18   Temp:  97.9 F (36.6 C) 98.3 F (36.8 C)   TempSrc:   Oral   SpO2: 100% 100% 100% 100%  Weight:      Height:       No intake or output data in the 24 hours ending 01/19/23 0736    01/17/2023   12:29 PM 11/19/2022   11:01 AM 10/31/2022   11:31 AM  Last 3 Weights  Weight (lbs) 212 lb 1.3 oz 212 lb 218 lb  Weight (kg) 96.2 kg 96.163 kg 98.884 kg      Telemetry    NSR HR 70-80s - Personally Reviewed  ECG    No new - Personally Reviewed  Physical Exam   GEN: No acute distress.   Neck: No JVD Cardiac: RRR, no murmurs, rubs, or gallops.   Respiratory: Clear to auscultation bilaterally. GI: Soft, nontender, non-distended  MS: No edema; No deformity. Neuro:  Nonfocal  Psych: Normal affect   Labs    High Sensitivity Troponin:   Recent Labs  Lab 01/17/23 1132 01/17/23 1551 01/17/23 1859 01/17/23 2056  TROPONINIHS 33* 35* 27* 28*     Chemistry Recent Labs  Lab 01/17/23 1132 01/18/23 0531  NA 138 138  K 4.3 4.4  CL 103 106  CO2 21* 25  GLUCOSE 168* 90  BUN 21 17  CREATININE 1.64* 1.43*  CALCIUM 9.2 9.1  GFRNONAA 34* 40*  ANIONGAP 14 7    Lipids  Recent Labs  Lab 01/18/23 0531  CHOL 134  TRIG 42  HDL 72  LDLCALC 54  CHOLHDL 1.9    Hematology Recent Labs  Lab 01/17/23 1132 01/18/23 0531  WBC 7.3 3.9*  RBC 4.41 3.88  HGB 13.0 11.6*  HCT 40.7 35.4*  MCV 92.3 91.2  MCH 29.5 29.9  MCHC 31.9 32.8  RDW 12.4 12.5  PLT 218 185   Thyroid No results for input(s): "TSH", "FREET4" in the last 168  hours.  BNPNo results for input(s): "BNP", "PROBNP" in the last 168 hours.  DDimer No results for input(s): "DDIMER" in the last 168 hours.   Radiology    CT Angio Chest Aorta W and/or Wo Contrast  Result Date: 01/17/2023 CLINICAL DATA:  Acute onset chest pain after her water aerobics class. EXAM: CT ANGIOGRAPHY CHEST WITH CONTRAST TECHNIQUE: Multidetector CT imaging of the chest was performed using the standard protocol during bolus administration of intravenous contrast. Multiplanar CT image reconstructions and MIPs were obtained to evaluate the vascular anatomy. RADIATION DOSE REDUCTION: This exam was performed according to the departmental dose-optimization program which includes automated exposure control, adjustment of the mA and/or kV according to patient size and/or use of iterative reconstruction technique. CONTRAST:  60mL OMNIPAQUE IOHEXOL 350 MG/ML SOLN COMPARISON:  Chest x-ray 01/17/2023 earlier. Noncontrast chest CT 04/15/2022 and older. FINDINGS: Cardiovascular: No aortic dissection or aneurysm  formation. Mild atherosclerotic plaque. Diameter of the ascending aorta proximally approaches 2.8 cm. Diameter of the mid ascending aorta at the level of the main pulmonary artery measures 3.4 x 3.5 cm. Diameter of the aortic arch approaches 2.3 cm. And diameter of the mid descending thoracic aorta measures 2.6 by 2.6 cm. Heart is nonenlarged. Trace pericardial fluid. Mediastinum/Nodes: Preserved thyroid gland. The esophagus has a normal course and caliber. No abnormal lymph node enlargement identified in the axillary regions. There are several enlarged mediastinal hilar nodes. Several these have calcifications. Example right hilar node which has a previous short axis of 19 mm, today when measured in the similar fashion measures 19 mm on series 4, image 72. Precarinal node previously has a diameter in short axis of 14 mm and today when measured in the same fashion on series 4, image 59 14 mm. Other nodes are also similar including subcarinal and left hilar. Lungs/Pleura: No pneumothorax or effusion. There are areas of reticular and nodular changes the lungs as well as some subtle ground-glass patchy areas and cavitary lesions. Extent and distribution is similar to previous. Example noncalcified non cavitary nodule left upper lobe which measured 4 mm, today measures 5 mm on series 6, image 52. Lesion seen more caudal in the left upper lobe trauma lingula which previously measured 8 mm in maximal dimension, today measures 8 mm on series 6, image 73. Upper Abdomen: Multilobular kidneys with the areas of atrophy and dilatation with cystic areas and stones. Surgical changes from gastric bypass. Musculoskeletal: Scattered degenerative changes along the spine. Diffuse scattered bridging endplate osteophytes and syndesmophytes. Review of the MIP images confirms the above findings. IMPRESSION: Mild atherosclerotic calcified plaque. No aortic dissection or aneurysm formation. Once again there are several enlarged partially  calcified lymph nodes in the hilum and mediastinum as well as reticulonodular changes the lungs with cavitary and non cavitary ill-defined areas of nodularity. Overall the distribution is similar to previous examination. Please correlate for known history and recommend continued follow-up in 6-12 months to ensure long-term stability. Aortic Atherosclerosis (ICD10-I70.0). Electronically Signed   By: Karen Kays M.D.   On: 01/17/2023 14:48   DG Chest 2 View  Result Date: 01/17/2023 CLINICAL DATA:  Chest pain after water aerobics class. High midsternal pain. EXAM: CHEST - 2 VIEW COMPARISON:  Chest radiographs 09/10/2021 and 09/07/2019; CT chest 04/15/2022 FINDINGS: Cardiac silhouette and mediastinal contours within normal limits. There is again flattening of the diaphragms and moderate hyperinflation. There are lucencies within the bilateral upper lungs, consistent with a bronchiectasis seen on prior CT. No acute airspace opacity is seen.  No pulmonary edema, pleural effusion, or pneumothorax. Right apical pleural-parenchymal scarring and region of cystic bronchiectasis on prior CT, unchanged from prior radiographs. Moderate multilevel degenerative disc changes of the thoracic spine. Left upper abdominal surgical clips. IMPRESSION: 1. No acute airspace opacity. 2. Chronic hyperinflation and bilateral upper lobe bronchiectasis. 3. Right apical pleural-parenchymal scarring and region of cystic bronchiectasis, unchanged from prior radiographs and CT. Electronically Signed   By: Neita Garnet M.D.   On: 01/17/2023 12:28    Cardiac Studies   Echo pending  Echo 2021 1. Left ventricular ejection fraction, by estimation, is 55 to 60%. The  left ventricle has normal function. Left ventricular endocardial border  not optimally defined to evaluate regional wall motion. Left ventricular  diastolic parameters were normal.   2. Right ventricular systolic function is normal. The right ventricular  size is normal.  There is normal pulmonary artery systolic pressure.   3. The mitral valve is normal in structure. No evidence of mitral valve  regurgitation. No evidence of mitral stenosis.   4. The aortic valve is normal in structure. Aortic valve regurgitation is  not visualized. Mild aortic valve sclerosis is present, with no evidence  of aortic valve stenosis.   5. The inferior vena cava is normal in size with greater than 50%  respiratory variability, suggesting right atrial pressure of 3 mmHg.   Comparison(s): Previous Echo showed LV EF 50-55%, no RWMA, mild RAE.   Patient Profile     72 y.o. female with a hx of atherosclerosis of the aorta, hyperlipidemia, depression, obesity status post gastric bypass, sarcoidosis in the lung and MSK, CKD stage III, OSA on CPAP, RLS, sickle cell trait, cervical radiculopathy, chronic back pain who is being seen 01/17/2023 for the evaluation of chest pain   Assessment & Plan    Chest pain - patient reports overall atypical chest pain that started after water aerobics and noting elevated HR to 130s.  - HS trop 33>35>27>28 - EKG showed ST with no ischemic changes - She reports 1/10 dull chest pain - CTA chest without dissection - No h/o CAD. She does have RF - started on ASA 81mg  daily. PTA lipitor continued - echo pending - plan for [facility] Monday Risks and benefits of cardiac catheterization have been discussed with the patient.  These include bleeding, infection, kidney damage, stroke, heart attack, death.  The patient understands these risks and is willing to proceed.   HTN - BP good - PTA amlodipine 5mg  daily and Losartan 50mg  daily   HLD - LDL 54 - Lipitor 40mg  daily   CKD stage 3 - stable SCr  For questions or updates, please contact [facility] Please consult [email or web address] for contact info under        Signed, Jerett Odonohue David Stall, [name]  01/19/2023, 7:36 AM

## 2023-01-19 NOTE — Plan of Care (Signed)

## 2023-01-19 NOTE — Progress Notes (Signed)
PROGRESS NOTE    Melissa [name]  ZOX:[id number] DOB: Feb 20, 1951 DOA: 01/17/2023 PCP: Alba Cory, MD   Brief Narrative:  This 72 years old female with PMH significant for hypertension, hyperlipidemia, asthma, OSA on CPAP, CKD stage IIIb, sarcoidosis, sickle cell trait, s/p Roxanne Y gastric bypass, depression presented in the ED with complaints of chest pain.  Patient has completed water aerobics training and felt like her heart was persistently elevated.  She denies any palpitation or feeling of heart racing.  Later in the day she has developed chest pain on the right side pressure-like radiating towards the back.  ED workup reveals elevated troponins stable renal functions.  CTA negative for dissection.  Patient admitted for further evaluation,  Cardiology is consulted.  Patient is scheduled for left heart cath on Monday.  Assessment & Plan:   Principal Problem:   Chest pain Active Problems:   HTN (hypertension)   HLD (hyperlipidemia)   Stage 3b chronic kidney disease (HCC)   Sarcoidosis of lung (HCC)   Chronic bronchitis (HCC)   Asthma   Depression   OSA on CPAP   Unstable angina (HCC)   Elevated troponin  Chest pain: Patient presented with chest pain following water aerobic training. Troponin slightly elevated at 33 > 35 > 28 Concern for unstable angina CTA negative for dissection. No respiratory symptoms to suggest PE.  Cardiology is consulted. Continue , aspirin, Lipitor and nitroglycerin as needed. TTE unremarkable Cardiology evaluated and recommended left heart cath on Monday.  Tachycardia Resolved. Sinus on tele. No anemia - check tsh - consider cardiac monitor at discharge  Essential hypertension: Continue amlodipine, Cozaar IV hydralazine as needed.  Hyperlipidemia Continue Lipitor and Zetia.  CKD stage IIIb. Serum creatinine at baseline.   Avoid nephrotoxic medications.  Sarcoidosis of lung: Patient is following with Dr. Belia Heman pulmonology as an  outpatient. Outpatient follow up.  Pulmonary nodules Mediastinal lymph nodes Incidental on ct of chest, described as stable to improved, radiology advising 6-12 mo f/u  Chronic bronchitis and asthma: Continue as needed bronchodilators.  Depression: Continue home medications  OSA on CPAP.  Abnormal findings on CT chest: Follow-up outpatient with pulmonologist  DVT prophylaxis: Lovenox Code Status: Full code. Family Communication: No family at bed side. Disposition Plan:   Status is: Observation The patient remains OBS appropriate and will d/c before 2 midnights.    Consultants:  Cardiology  Procedures: Scheduled for left heart cath on Monday  Antimicrobials: Anti-infectives (From admission, onward)    None      Subjective: Patient was seen and examined at bedside.  Overnight events noted.   Had another episode of mild chest pain this morning that resolved spontaneously    Objective: Vitals:   01/19/23 0719 01/19/23 0803 01/19/23 0848 01/19/23 0902  BP:    124/81  Pulse:    74  Resp:  11 18 20   Temp:    98 F (36.7 C)  TempSrc:    Oral  SpO2: 100%   100%  Weight:      Height:       No intake or output data in the 24 hours ending 01/19/23 1213  Filed Weights   01/17/23 1229  Weight: 96.2 kg    Examination:  General exam: Appears calm and comfortable, not in any acute distress.  Deconditioned Respiratory system: Clear to auscultation. Respiratory effort normal.  RR 13 Cardiovascular system: S1 & S2 heard, RRR. No JVD, murmurs, rubs, gallops or clicks. No pedal edema. Gastrointestinal system: Abdomen is  soft, non tender, non distended, BS+ Central nervous system: Alert and oriented x 3. No focal neurological deficits. Extremities: No edema, No cyanosis, No clubbing Skin: No rashes, lesions or ulcers Psychiatry: Judgement and insight appear normal. Mood & affect appropriate.     Data Reviewed: I have personally reviewed following labs and imaging  studies  CBC: Recent Labs  Lab 01/17/23 1132 01/18/23 0531  WBC 7.3 3.9*  HGB 13.0 11.6*  HCT 40.7 35.4*  MCV 92.3 91.2  PLT 218 185   Basic Metabolic Panel: Recent Labs  Lab 01/17/23 1132 01/18/23 0531  NA 138 138  K 4.3 4.4  CL 103 106  CO2 21* 25  GLUCOSE 168* 90  BUN 21 17  CREATININE 1.64* 1.43*  CALCIUM 9.2 9.1   GFR: Estimated Creatinine Clearance: 48.2 mL/min (A) (by C-G formula based on SCr of 1.43 mg/dL (H)). Liver Function Tests: No results for input(s): "AST", "ALT", "ALKPHOS", "BILITOT", "PROT", "ALBUMIN" in the last 168 hours. No results for input(s): "LIPASE", "AMYLASE" in the last 168 hours. No results for input(s): "AMMONIA" in the last 168 hours. Coagulation Profile: Recent Labs  Lab 01/17/23 1551  INR 1.0   Cardiac Enzymes: No results for input(s): "CKTOTAL", "CKMB", "CKMBINDEX", "TROPONINI" in the last 168 hours. BNP (last 3 results) No results for input(s): "PROBNP" in the last 8760 hours. HbA1C: No results for input(s): "HGBA1C" in the last 72 hours. CBG: No results for input(s): "GLUCAP" in the last 168 hours. Lipid Profile: Recent Labs    01/18/23 0531  CHOL 134  HDL 72  LDLCALC 54  TRIG 42  CHOLHDL 1.9   Thyroid Function Tests: No results for input(s): "TSH", "T4TOTAL", "FREET4", "T3FREE", "THYROIDAB" in the last 72 hours. Anemia Panel: No results for input(s): "VITAMINB12", "FOLATE", "FERRITIN", "TIBC", "IRON", "RETICCTPCT" in the last 72 hours. Sepsis Labs: No results for input(s): "PROCALCITON", "LATICACIDVEN" in the last 168 hours.  No results found for this or any previous visit (from the past 240 hour(s)).   Radiology Studies: ECHOCARDIOGRAM COMPLETE  Result Date: 01/19/2023    ECHOCARDIOGRAM REPORT   Patient Name:   Melissa [name] Date of Exam: 01/18/2023 Medical Rec #:  [id number]        Height:       72.0 in Accession #:    [id number]       Weight:       212.1 lb Date of Birth:  1951-05-03        BSA:          2.184  m Patient Age:    71 years         BP:           139/81 mmHg Patient Gender: F                HR:           72 bpm. Exam Location:  [facility] Procedure: 2D Echo Indications:     Chest Pain R07.9  History:         Patient has prior history of Echocardiogram examinations, most                  recent 04/18/2020.  Sonographer:     Overton Mam RDCS, FASE Referring Phys:  [id number] Francee Nodal FURTH Diagnosing Phys: Chilton Si MD IMPRESSIONS  1. Left ventricular ejection fraction, by estimation, is 60 to 65%. The left ventricle has normal function. The left ventricle has no regional wall motion abnormalities. Left ventricular  diastolic parameters are consistent with Grade I diastolic dysfunction (impaired relaxation).  2. Right ventricular systolic function is normal. The right ventricular size is normal. There is normal pulmonary artery systolic pressure.  3. The mitral valve is normal in structure. No evidence of mitral valve regurgitation. No evidence of mitral stenosis.  4. The aortic valve is tricuspid. Aortic valve regurgitation is not visualized. No aortic stenosis is present.  5. The inferior vena cava is normal in size with greater than 50% respiratory variability, suggesting right atrial pressure of 3 mmHg. FINDINGS  Left Ventricle: Left ventricular ejection fraction, by estimation, is 60 to 65%. The left ventricle has normal function. The left ventricle has no regional wall motion abnormalities. The left ventricular internal cavity size was normal in size. There is  no left ventricular hypertrophy. Left ventricular diastolic parameters are consistent with Grade I diastolic dysfunction (impaired relaxation). Normal left ventricular filling pressure. Right Ventricle: The right ventricular size is normal. No increase in right ventricular wall thickness. Right ventricular systolic function is normal. There is normal pulmonary artery systolic pressure. The tricuspid regurgitant velocity is 2.42 m/s, and  with an  assumed right atrial pressure of 3 mmHg, the estimated right ventricular systolic pressure is 26.4 mmHg. Left Atrium: Left atrial size was normal in size. Right Atrium: Right atrial size was normal in size. Pericardium: There is no evidence of pericardial effusion. Mitral Valve: The mitral valve is normal in structure. No evidence of mitral valve regurgitation. No evidence of mitral valve stenosis. Tricuspid Valve: The tricuspid valve is normal in structure. Tricuspid valve regurgitation is mild . No evidence of tricuspid stenosis. Aortic Valve: The aortic valve is tricuspid. Aortic valve regurgitation is not visualized. No aortic stenosis is present. Aortic valve peak gradient measures 7.8 mmHg. Pulmonic Valve: The pulmonic valve was normal in structure. Pulmonic valve regurgitation is not visualized. No evidence of pulmonic stenosis. Aorta: The aortic root is normal in size and structure. Venous: The inferior vena cava is normal in size with greater than 50% respiratory variability, suggesting right atrial pressure of 3 mmHg. IAS/Shunts: No atrial level shunt detected by color flow Doppler.  LEFT VENTRICLE PLAX 2D LVIDd:         4.30 cm Diastology LVIDs:         3.00 cm LV e' medial:    7.72 cm/s LV PW:         1.10 cm LV E/e' medial:  7.3 LV IVS:        0.90 cm LV e' lateral:   13.90 cm/s                        LV E/e' lateral: 4.0  RIGHT VENTRICLE RV Basal diam:  2.50 cm RV S prime:     14.90 cm/s TAPSE (M-mode): 1.8 cm LEFT ATRIUM             Index        RIGHT ATRIUM           Index LA diam:        3.30 cm 1.51 cm/m   RA Area:     10.90 cm LA Vol (A2C):   39.4 ml 18.04 ml/m  RA Volume:   22.70 ml  10.39 ml/m LA Vol (A4C):   33.8 ml 15.47 ml/m LA Biplane Vol: 36.4 ml 16.66 ml/m  AORTIC VALVE              PULMONIC VALVE AV Vmax:  140.00 cm/s PV Vmax:       0.84 m/s AV Peak Grad: 7.8 mmHg    PV Peak grad:  2.8 mmHg LVOT Vmax:    96.60 cm/s LVOT Vmean:   64.600 cm/s LVOT VTI:     0.188 m MITRAL VALVE                TRICUSPID VALVE MV Area (PHT): 3.45 cm    TR Peak grad:   23.4 mmHg MV Decel Time: 220 msec    TR Vmax:        242.00 cm/s MV E velocity: 56.10 cm/s MV A velocity: 67.60 cm/s  SHUNTS MV E/A ratio:  0.83        Systemic VTI: 0.19 m Chilton Si MD Electronically signed by Chilton Si MD Signature Date/Time: 01/19/2023/8:56:56 AM    Final    CT Angio Chest Aorta W and/or Wo Contrast  Result Date: 01/17/2023 CLINICAL DATA:  Acute onset chest pain after her water aerobics class. EXAM: CT ANGIOGRAPHY CHEST WITH CONTRAST TECHNIQUE: Multidetector CT imaging of the chest was performed using the standard protocol during bolus administration of intravenous contrast. Multiplanar CT image reconstructions and MIPs were obtained to evaluate the vascular anatomy. RADIATION DOSE REDUCTION: This exam was performed according to the departmental dose-optimization program which includes automated exposure control, adjustment of the mA and/or kV according to patient size and/or use of iterative reconstruction technique. CONTRAST:  60mL OMNIPAQUE IOHEXOL 350 MG/ML SOLN COMPARISON:  Chest x-ray 01/17/2023 earlier. Noncontrast chest CT 04/15/2022 and older. FINDINGS: Cardiovascular: No aortic dissection or aneurysm formation. Mild atherosclerotic plaque. Diameter of the ascending aorta proximally approaches 2.8 cm. Diameter of the mid ascending aorta at the level of the main pulmonary artery measures 3.4 x 3.5 cm. Diameter of the aortic arch approaches 2.3 cm. And diameter of the mid descending thoracic aorta measures 2.6 by 2.6 cm. Heart is nonenlarged. Trace pericardial fluid. Mediastinum/Nodes: Preserved thyroid gland. The esophagus has a normal course and caliber. No abnormal lymph node enlargement identified in the axillary regions. There are several enlarged mediastinal hilar nodes. Several these have calcifications. Example right hilar node which has a previous short axis of 19 mm, today when measured in the  similar fashion measures 19 mm on series 4, image 72. Precarinal node previously has a diameter in short axis of 14 mm and today when measured in the same fashion on series 4, image 59 14 mm. Other nodes are also similar including subcarinal and left hilar. Lungs/Pleura: No pneumothorax or effusion. There are areas of reticular and nodular changes the lungs as well as some subtle ground-glass patchy areas and cavitary lesions. Extent and distribution is similar to previous. Example noncalcified non cavitary nodule left upper lobe which measured 4 mm, today measures 5 mm on series 6, image 52. Lesion seen more caudal in the left upper lobe trauma lingula which previously measured 8 mm in maximal dimension, today measures 8 mm on series 6, image 73. Upper Abdomen: Multilobular kidneys with the areas of atrophy and dilatation with cystic areas and stones. Surgical changes from gastric bypass. Musculoskeletal: Scattered degenerative changes along the spine. Diffuse scattered bridging endplate osteophytes and syndesmophytes. Review of the MIP images confirms the above findings. IMPRESSION: Mild atherosclerotic calcified plaque. No aortic dissection or aneurysm formation. Once again there are several enlarged partially calcified lymph nodes in the hilum and mediastinum as well as reticulonodular changes the lungs with cavitary and non cavitary ill-defined areas of nodularity. Overall the distribution  is similar to previous examination. Please correlate for known history and recommend continued follow-up in 6-12 months to ensure long-term stability. Aortic Atherosclerosis (ICD10-I70.0). Electronically Signed   By: Karen Kays M.D.   On: 01/17/2023 14:48    Scheduled Meds:  amLODipine  5 mg Oral Daily   vitamin C  1,000 mg Oral Daily   aspirin EC  81 mg Oral Daily   atorvastatin  40 mg Oral Daily   budesonide  0.5 mg Nebulization BID   enoxaparin (LOVENOX) injection  40 mg Subcutaneous Q24H   ezetimibe  10 mg Oral  Daily   ferrous sulfate  325 mg Oral Q breakfast   fluticasone furoate-vilanterol  1 puff Inhalation Daily   And   umeclidinium bromide  1 puff Inhalation Daily   gabapentin  100 mg Oral Q2000   gabapentin  200 mg Oral QHS   loratadine  10 mg Oral Daily   losartan  50 mg Oral Daily   magnesium oxide  400 mg Oral Daily   multivitamin with minerals  1 tablet Oral Daily   nortriptyline  10-20 mg Oral QHS   saccharomyces boulardii  250 mg Oral Daily   sodium chloride flush  3 mL Intravenous Q12H   zinc sulfate  220 mg Oral Daily   Continuous Infusions:   LOS: 1 day    Time spent: 35 mins    Silvano Bilis, MD [facility]   If 7PM-7AM, please contact night-coverage

## 2023-01-19 NOTE — Progress Notes (Addendum)
 Rounding Note    Patient Name: Melissa [name] Date of Encounter: 01/19/2023  [facility] Cardiologist: NEw  Subjective   The patient reports dull chest pain. Breathing is good. Plan for heart cath tomorrow.  Inpatient Medications    Scheduled Meds:  amLODipine  5 mg Oral Daily   vitamin C  1,000 mg Oral Daily   aspirin EC  81 mg Oral Daily   atorvastatin  40 mg Oral Daily   budesonide  0.5 mg Nebulization BID   enoxaparin (LOVENOX) injection  40 mg Subcutaneous Q24H   ezetimibe  10 mg Oral Daily   ferrous sulfate  325 mg Oral Q breakfast   fluticasone furoate-vilanterol  1 puff Inhalation Daily   And   umeclidinium bromide  1 puff Inhalation Daily   gabapentin  100 mg Oral Q2000   gabapentin  200 mg Oral QHS   loratadine  10 mg Oral Daily   losartan  50 mg Oral Daily   magnesium oxide  400 mg Oral Daily   multivitamin with minerals  1 tablet Oral Daily   nortriptyline  10-20 mg Oral QHS   saccharomyces boulardii  250 mg Oral Daily   zinc sulfate  220 mg Oral Daily   Continuous Infusions:  PRN Meds: acetaminophen, albuterol, dextromethorphan-guaiFENesin, hydrALAZINE, methocarbamol, morphine injection, nitroGLYCERIN, ondansetron (ZOFRAN) IV, oxyCODONE-acetaminophen   Vital Signs    Vitals:   01/18/23 2010 01/19/23 0044 01/19/23 0504 01/19/23 0719  BP:  117/72 107/65   Pulse:  69 74   Resp:  18 18   Temp:  97.9 F (36.6 C) 98.3 F (36.8 C)   TempSrc:   Oral   SpO2: 100% 100% 100% 100%  Weight:      Height:       No intake or output data in the 24 hours ending 01/19/23 0736    01/17/2023   12:29 PM 11/19/2022   11:01 AM 10/31/2022   11:31 AM  Last 3 Weights  Weight (lbs) 212 lb 1.3 oz 212 lb 218 lb  Weight (kg) 96.2 kg 96.163 kg 98.884 kg      Telemetry    NSR HR 70-80s - Personally Reviewed  ECG    No new - Personally Reviewed  Physical Exam   GEN: No acute distress.   Neck: No JVD Cardiac: RRR, no murmurs, rubs, or gallops.   Respiratory: Clear to auscultation bilaterally. GI: Soft, nontender, non-distended  MS: No edema; No deformity. Neuro:  Nonfocal  Psych: Normal affect   Labs    High Sensitivity Troponin:   Recent Labs  Lab 01/17/23 1132 01/17/23 1551 01/17/23 1859 01/17/23 2056  TROPONINIHS 33* 35* 27* 28*     Chemistry Recent Labs  Lab 01/17/23 1132 01/18/23 0531  NA 138 138  K 4.3 4.4  CL 103 106  CO2 21* 25  GLUCOSE 168* 90  BUN 21 17  CREATININE 1.64* 1.43*  CALCIUM 9.2 9.1  GFRNONAA 34* 40*  ANIONGAP 14 7    Lipids  Recent Labs  Lab 01/18/23 0531  CHOL 134  TRIG 42  HDL 72  LDLCALC 54  CHOLHDL 1.9    Hematology Recent Labs  Lab 01/17/23 1132 01/18/23 0531  WBC 7.3 3.9*  RBC 4.41 3.88  HGB 13.0 11.6*  HCT 40.7 35.4*  MCV 92.3 91.2  MCH 29.5 29.9  MCHC 31.9 32.8  RDW 12.4 12.5  PLT 218 185   Thyroid No results for input(s): "TSH", "FREET4" in the last 168   hours.  BNPNo results for input(s): "BNP", "PROBNP" in the last 168 hours.  DDimer No results for input(s): "DDIMER" in the last 168 hours.   Radiology    CT Angio Chest Aorta W and/or Wo Contrast  Result Date: 01/17/2023 CLINICAL DATA:  Acute onset chest pain after her water aerobics class. EXAM: CT ANGIOGRAPHY CHEST WITH CONTRAST TECHNIQUE: Multidetector CT imaging of the chest was performed using the standard protocol during bolus administration of intravenous contrast. Multiplanar CT image reconstructions and MIPs were obtained to evaluate the vascular anatomy. RADIATION DOSE REDUCTION: This exam was performed according to the departmental dose-optimization program which includes automated exposure control, adjustment of the mA and/or kV according to patient size and/or use of iterative reconstruction technique. CONTRAST:  60mL OMNIPAQUE IOHEXOL 350 MG/ML SOLN COMPARISON:  Chest x-ray 01/17/2023 earlier. Noncontrast chest CT 04/15/2022 and older. FINDINGS: Cardiovascular: No aortic dissection or aneurysm  formation. Mild atherosclerotic plaque. Diameter of the ascending aorta proximally approaches 2.8 cm. Diameter of the mid ascending aorta at the level of the main pulmonary artery measures 3.4 x 3.5 cm. Diameter of the aortic arch approaches 2.3 cm. And diameter of the mid descending thoracic aorta measures 2.6 by 2.6 cm. Heart is nonenlarged. Trace pericardial fluid. Mediastinum/Nodes: Preserved thyroid gland. The esophagus has a normal course and caliber. No abnormal lymph node enlargement identified in the axillary regions. There are several enlarged mediastinal hilar nodes. Several these have calcifications. Example right hilar node which has a previous short axis of 19 mm, today when measured in the similar fashion measures 19 mm on series 4, image 72. Precarinal node previously has a diameter in short axis of 14 mm and today when measured in the same fashion on series 4, image 59 14 mm. Other nodes are also similar including subcarinal and left hilar. Lungs/Pleura: No pneumothorax or effusion. There are areas of reticular and nodular changes the lungs as well as some subtle ground-glass patchy areas and cavitary lesions. Extent and distribution is similar to previous. Example noncalcified non cavitary nodule left upper lobe which measured 4 mm, today measures 5 mm on series 6, image 52. Lesion seen more caudal in the left upper lobe trauma lingula which previously measured 8 mm in maximal dimension, today measures 8 mm on series 6, image 73. Upper Abdomen: Multilobular kidneys with the areas of atrophy and dilatation with cystic areas and stones. Surgical changes from gastric bypass. Musculoskeletal: Scattered degenerative changes along the spine. Diffuse scattered bridging endplate osteophytes and syndesmophytes. Review of the MIP images confirms the above findings. IMPRESSION: Mild atherosclerotic calcified plaque. No aortic dissection or aneurysm formation. Once again there are several enlarged partially  calcified lymph nodes in the hilum and mediastinum as well as reticulonodular changes the lungs with cavitary and non cavitary ill-defined areas of nodularity. Overall the distribution is similar to previous examination. Please correlate for known history and recommend continued follow-up in 6-12 months to ensure long-term stability. Aortic Atherosclerosis (ICD10-I70.0). Electronically Signed   By: Ashok  Gupta M.D.   On: 01/17/2023 14:48   DG Chest 2 View  Result Date: 01/17/2023 CLINICAL DATA:  Chest pain after water aerobics class. High midsternal pain. EXAM: CHEST - 2 VIEW COMPARISON:  Chest radiographs 09/10/2021 and 09/07/2019; CT chest 04/15/2022 FINDINGS: Cardiac silhouette and mediastinal contours within normal limits. There is again flattening of the diaphragms and moderate hyperinflation. There are lucencies within the bilateral upper lungs, consistent with a bronchiectasis seen on prior CT. No acute airspace opacity is seen.   No pulmonary edema, pleural effusion, or pneumothorax. Right apical pleural-parenchymal scarring and region of cystic bronchiectasis on prior CT, unchanged from prior radiographs. Moderate multilevel degenerative disc changes of the thoracic spine. Left upper abdominal surgical clips. IMPRESSION: 1. No acute airspace opacity. 2. Chronic hyperinflation and bilateral upper lobe bronchiectasis. 3. Right apical pleural-parenchymal scarring and region of cystic bronchiectasis, unchanged from prior radiographs and CT. Electronically Signed   By: Ronald  Viola M.D.   On: 01/17/2023 12:28    Cardiac Studies   Echo pending  Echo 2021 1. Left ventricular ejection fraction, by estimation, is 55 to 60%. The  left ventricle has normal function. Left ventricular endocardial border  not optimally defined to evaluate regional wall motion. Left ventricular  diastolic parameters were normal.   2. Right ventricular systolic function is normal. The right ventricular  size is normal.  There is normal pulmonary artery systolic pressure.   3. The mitral valve is normal in structure. No evidence of mitral valve  regurgitation. No evidence of mitral stenosis.   4. The aortic valve is normal in structure. Aortic valve regurgitation is  not visualized. Mild aortic valve sclerosis is present, with no evidence  of aortic valve stenosis.   5. The inferior vena cava is normal in size with greater than 50%  respiratory variability, suggesting right atrial pressure of 3 mmHg.   Comparison(s): Previous Echo showed LV EF 50-55%, no RWMA, mild RAE.   Patient Profile     71 y.o. female with a hx of atherosclerosis of the aorta, hyperlipidemia, depression, obesity status post gastric bypass, sarcoidosis in the lung and MSK, CKD stage III, OSA on CPAP, RLS, sickle cell trait, cervical radiculopathy, chronic back pain who is being seen 01/17/2023 for the evaluation of chest pain   Assessment & Plan    Chest pain - patient reports overall atypical chest pain that started after water aerobics and noting elevated HR to 130s.  - HS trop 33>35>27>28 - EKG showed ST with no ischemic changes - She reports 1/10 dull chest pain - CTA chest without dissection - No h/o CAD. She does have RF - started on ASA 81mg daily. PTA lipitor continued - echo pending - plan for LHC Monday Risks and benefits of cardiac catheterization have been discussed with the patient.  These include bleeding, infection, kidney damage, stroke, heart attack, death.  The patient understands these risks and is willing to proceed.   HTN - BP good - PTA amlodipine 5mg daily and Losartan 50mg daily   HLD - LDL 54 - Lipitor 40mg daily   CKD stage 3 - stable SCr  For questions or updates, please contact [facility] Please consult [email or web address] for contact info under        Signed, Carmine Carrozza H Atha Muradyan, [name]  01/19/2023, 7:36 AM    

## 2023-01-20 ENCOUNTER — Other Ambulatory Visit: Payer: Self-pay

## 2023-01-20 ENCOUNTER — Encounter
Admission: EM | Disposition: A | Discharge status: Home / Self Care | Payer: Self-pay | Source: Home / Self Care | Attending: Obstetrics and Gynecology

## 2023-01-20 DIAGNOSIS — R0789 Other chest pain: Secondary | ICD-10-CM

## 2023-01-20 HISTORY — PX: LEFT HEART CATH AND CORONARY ANGIOGRAPHY: CATH118249

## 2023-01-20 LAB — HEMOGLOBIN A1C
Hgb A1c MFr Bld: 5.9 % — ABNORMAL HIGH (ref 4.8–5.6)
Mean Plasma Glucose: 123 mg/dL

## 2023-01-20 SURGERY — LEFT HEART CATH AND CORONARY ANGIOGRAPHY
Anesthesia: Moderate Sedation

## 2023-01-20 MED ORDER — SODIUM CHLORIDE 0.9 % IV SOLN: 250.0000 mL | INTRAVENOUS | Status: DC | PRN

## 2023-01-20 MED ORDER — LIDOCAINE HCL (PF) 1 % IJ SOLN
INTRAMUSCULAR | Status: DC | PRN
  Administered 2023-01-20: 2 mL

## 2023-01-20 MED ORDER — HEPARIN SODIUM (PORCINE) 1000 UNIT/ML IJ SOLN
INTRAMUSCULAR | Status: AC
  Filled 2023-01-20: qty 10

## 2023-01-20 MED ORDER — HEPARIN (PORCINE) IN NACL 1000-0.9 UT/500ML-% IV SOLN
INTRAVENOUS | Status: AC
  Filled 2023-01-20: qty 1000

## 2023-01-20 MED ORDER — SODIUM CHLORIDE 0.9 % WEIGHT BASED INFUSION
3.0000 mL/kg/h | INTRAVENOUS | Status: DC
  Administered 2023-01-20: 3 mL/kg/h via INTRAVENOUS

## 2023-01-20 MED ORDER — SODIUM CHLORIDE 0.9 % WEIGHT BASED INFUSION: 1.0000 mL/kg/h | INTRAVENOUS | Status: DC

## 2023-01-20 MED ORDER — MIDAZOLAM HCL 2 MG/2ML IJ SOLN
INTRAMUSCULAR | Status: DC | PRN
  Administered 2023-01-20: 1 mg via INTRAVENOUS

## 2023-01-20 MED ORDER — VERAPAMIL HCL 2.5 MG/ML IV SOLN
INTRAVENOUS | Status: AC
  Filled 2023-01-20: qty 2

## 2023-01-20 MED ORDER — FENTANYL CITRATE (PF) 100 MCG/2ML IJ SOLN
INTRAMUSCULAR | Status: DC | PRN
  Administered 2023-01-20: 25 ug via INTRAVENOUS

## 2023-01-20 MED ORDER — HEPARIN (PORCINE) IN NACL 1000-0.9 UT/500ML-% IV SOLN
INTRAVENOUS | Status: DC | PRN
  Administered 2023-01-20 (×2): 500 mL

## 2023-01-20 MED ORDER — IOHEXOL 300 MG/ML  SOLN
INTRAMUSCULAR | Status: DC | PRN
  Administered 2023-01-20: 17 mL

## 2023-01-20 MED ORDER — FENTANYL CITRATE (PF) 100 MCG/2ML IJ SOLN
INTRAMUSCULAR | Status: AC
  Filled 2023-01-20: qty 2

## 2023-01-20 MED ORDER — HEPARIN SODIUM (PORCINE) 1000 UNIT/ML IJ SOLN
INTRAMUSCULAR | Status: DC | PRN
  Administered 2023-01-20: 5000 [IU] via INTRAVENOUS

## 2023-01-20 MED ORDER — SODIUM CHLORIDE 0.9% FLUSH: 3.0000 mL | Freq: Two times a day (BID) | INTRAVENOUS | Status: DC

## 2023-01-20 MED ORDER — MIDAZOLAM HCL 2 MG/2ML IJ SOLN
INTRAMUSCULAR | Status: AC
  Filled 2023-01-20: qty 2

## 2023-01-20 MED ORDER — ASPIRIN 81 MG PO CHEW: 81.0000 mg | CHEWABLE_TABLET | ORAL | Status: DC

## 2023-01-20 MED ORDER — SODIUM CHLORIDE 0.9 % IV SOLN: INTRAVENOUS | Status: DC

## 2023-01-20 MED ORDER — SODIUM CHLORIDE 0.9% FLUSH: 3.0000 mL | INTRAVENOUS | Status: DC | PRN

## 2023-01-20 MED ORDER — VERAPAMIL HCL 2.5 MG/ML IV SOLN
INTRAVENOUS | Status: DC | PRN
  Administered 2023-01-20: 2.5 mg via INTRA_ARTERIAL

## 2023-01-20 MED ORDER — SODIUM CHLORIDE 0.9 % WEIGHT BASED INFUSION: 3.0000 mL/kg/h | INTRAVENOUS | Status: DC

## 2023-01-20 SURGICAL SUPPLY — 11 items
CATH INFINITI 5FR JK (CATHETERS) IMPLANT
DEVICE RAD TR BAND REGULAR (VASCULAR PRODUCTS) IMPLANT
DRAPE BRACHIAL (DRAPES) IMPLANT
DRAPE FEMORAL ANGIO W/ POUCH (DRAPES) IMPLANT
GLIDESHEATH SLEND SS 6F .021 (SHEATH) IMPLANT
GUIDEWIRE INQWIRE 1.5J.035X260 (WIRE) IMPLANT
INQWIRE 1.5J .035X260CM (WIRE) ×1
PACK CARDIAC CATH (CUSTOM PROCEDURE TRAY) ×1 IMPLANT
PROTECTION STATION PRESSURIZED (MISCELLANEOUS) ×1
SET ATX-X65L (MISCELLANEOUS) IMPLANT
STATION PROTECTION PRESSURIZED (MISCELLANEOUS) IMPLANT

## 2023-01-20 NOTE — Discharge Summary (Signed)
Melissa [name] ZOX:[id number] DOB: Jan 07, 1951 DOA: 01/17/2023  PCP: Alba Cory, MD  Admit date: 01/17/2023 Discharge date: 01/20/2023  Time spent: 35 minutes  Recommendations for Outpatient Follow-up:   Cardiology f/u    Discharge Diagnoses:  Principal Problem:   Chest pain Active Problems:   HTN (hypertension)   HLD (hyperlipidemia)   Stage 3b chronic kidney disease (HCC)   Sarcoidosis of lung (HCC)   Chronic bronchitis (HCC)   Asthma   Depression   OSA on CPAP   Unstable angina (HCC)   Elevated troponin   Discharge Condition: stable  Diet recommendation: heart healthy  Filed Weights   01/17/23 1229 01/20/23 1242  Weight: 96.2 kg 96.2 kg    History of present illness:  From admission h and p  Melissa [name] is a 72 y.o. female with medical history significant of HTN, HLD, asthma, OSA onCPAP, CKD-3b, sarcoidosis, sickle cell trait, s/p of Roux-en-Y gastric bypass, depression, who presents with chest pain.    Pt states that completed water aerobics today and felt like her heart rate was persistently elevated. Her heart rate was up to 130s by monitor. No palpitation or feeling of heart racing. She then developed chest pain, which is located in right side of chest, mild, pressure-like, radiating to the back.  She is not sure if her back pain back is due to exercise or from chest pain.  Denies shortness of breath, cough, fever or chills.  No nausea, vomiting, diarrhea or abdominal pain.  No symptoms of UTI.  No recent fall or rectal bleeding.    Hospital Course:  Patient presents with mild chest discomfort, non-exertional, in the setting of sinus tachycardia (resolved, normal tsh no anemia or electrolyte disturbance) and mild elevation of troponins. CTA negative for dissection. TTE unremarkable. LHC performed, nor significant CAD identified. Cardiology thinks troponin elevation most likely mediated by tachycardia. Placement of cardiac monitor deferred, instead Dr. Kirke Corin  advises outpatient cardiology f/u.   Procedures: LHC   Consultations: cardiology  Discharge Exam: Vitals:   01/20/23 1427 01/20/23 1443  BP: 125/87 120/78  Pulse: 83   Resp: 18   Temp:    SpO2: 100%     General: NAD Cardiovascular: RRR Respiratory: CTAB Ext: warm  Discharge Instructions   Discharge Instructions     Diet - low sodium heart healthy   Complete by: As directed    Increase activity slowly   Complete by: As directed       Allergies as of 01/20/2023       Reactions   Shellfish Allergy    Edema        Medication List     STOP taking these medications    metaxalone 800 MG tablet Commonly known as: SKELAXIN       TAKE these medications    acetaminophen 650 MG CR tablet Commonly known as: TYLENOL Take 1,300 mg by mouth every 8 (eight) hours as needed for pain.   albuterol 108 (90 Base) MCG/ACT inhaler Commonly known as: VENTOLIN HFA INHALE 1-2 PUFFS BY MOUTH EVERY 6 HOURS AS NEEDED FOR WHEEZE OR SHORTNESS OF BREATH   amLODipine 5 MG tablet Commonly known as: NORVASC Take 5 mg by mouth daily.   aspirin EC 81 MG tablet Take 1 tablet by mouth daily.   atorvastatin 40 MG tablet Commonly known as: LIPITOR TAKE 1 TABLET BY MOUTH EVERY DAY   azelastine 0.1 % nasal spray Commonly known as: ASTELIN Place 2 sprays into both nostrils 2 (two)  times daily. Use in each nostril as directed   budesonide 0.5 MG/2ML nebulizer solution Commonly known as: Pulmicort Take 2 mLs (0.5 mg total) by nebulization in the morning and at bedtime.   calcium carbonate 500 MG chewable tablet Commonly known as: TUMS - dosed in mg elemental calcium Chew 500-1,000 mg by mouth daily as needed for indigestion or heartburn.   Centrum Silver tablet Take 1 tablet by mouth daily.   ezetimibe 10 MG tablet Commonly known as: ZETIA TAKE 1 TABLET BY MOUTH EVERY DAY   ferrous sulfate 325 (65 FE) MG tablet Take 325 mg by mouth daily with breakfast.   fluticasone  50 MCG/ACT nasal spray Commonly known as: FLONASE SPRAY 2 SPRAYS INTO EACH NOSTRIL EVERY DAY   FORTIFY PROBIOTIC WOMENS EX ST PO Take by mouth daily.   gabapentin 100 MG capsule Commonly known as: NEURONTIN Take 100-200 mg by mouth 2 (two) times daily. One in am and two at night   ipratropium-albuterol 0.5-2.5 (3) MG/3ML Soln Commonly known as: DUONEB Take 3 mLs by nebulization every 6 (six) hours as needed.   levocetirizine 5 MG tablet Commonly known as: Xyzal Allergy 24HR Take 1 tablet (5 mg total) by mouth every evening.   losartan 50 MG tablet Commonly known as: COZAAR Take 50 mg by mouth daily.   magnesium oxide 400 MG tablet Commonly known as: MAG-OX Take 400 mg by mouth daily.   methocarbamol 500 MG tablet Commonly known as: ROBAXIN Take 500 mg by mouth every 8 (eight) hours as needed for muscle spasms.   nortriptyline 10 MG capsule Commonly known as: PAMELOR Take 10-20 mg by mouth at bedtime.   Trelegy Ellipta 200-62.5-25 MCG/ACT Aepb Generic drug: Fluticasone-Umeclidin-Vilant INHALE ONE ACTUATION INTO THE LUNGS DAILY   vitamin C 1000 MG tablet Take 1,000 mg by mouth daily.   zinc gluconate 50 MG tablet Take 50 mg by mouth daily.       Allergies  Allergen Reactions   Shellfish Allergy     Edema    Follow-up Information     Antonieta Iba, MD Follow up.   Specialty: Cardiology Contact information: [address] [place] [address] [phone]                  The results of significant diagnostics from this hospitalization (including imaging, microbiology, ancillary and laboratory) are listed below for reference.    Significant Diagnostic Studies: CARDIAC CATHETERIZATION  Result Date: 01/20/2023 1.  Normal coronary arteries. 2.  Left ventricular angiography was not performed.  EF was normal by echo. 3.  Normal left ventricular end-diastolic pressure. Recommendations: No clear culprit is identified for mildly  elevated troponin and chest pain.  Suspect tachycardia mediated event.   ECHOCARDIOGRAM COMPLETE  Result Date: 01/19/2023    ECHOCARDIOGRAM REPORT   Patient Name:   Melissa [name] Date of Exam: 01/18/2023 Medical Rec #:  [id number]        Height:       72.0 in Accession #:    [id number]       Weight:       212.1 lb Date of Birth:  30-Jul-1951        BSA:          2.184 m Patient Age:    71 years         BP:           139/81 mmHg Patient Gender: F  HR:           72 bpm. Exam Location:  [facility] Procedure: 2D Echo Indications:     Chest Pain R07.9  History:         Patient has prior history of Echocardiogram examinations, most                  recent 04/18/2020.  Sonographer:     Overton Mam RDCS, FASE Referring Phys:  [id number] Francee Nodal FURTH Diagnosing Phys: Chilton Si MD IMPRESSIONS  1. Left ventricular ejection fraction, by estimation, is 60 to 65%. The left ventricle has normal function. The left ventricle has no regional wall motion abnormalities. Left ventricular diastolic parameters are consistent with Grade I diastolic dysfunction (impaired relaxation).  2. Right ventricular systolic function is normal. The right ventricular size is normal. There is normal pulmonary artery systolic pressure.  3. The mitral valve is normal in structure. No evidence of mitral valve regurgitation. No evidence of mitral stenosis.  4. The aortic valve is tricuspid. Aortic valve regurgitation is not visualized. No aortic stenosis is present.  5. The inferior vena cava is normal in size with greater than 50% respiratory variability, suggesting right atrial pressure of 3 mmHg. FINDINGS  Left Ventricle: Left ventricular ejection fraction, by estimation, is 60 to 65%. The left ventricle has normal function. The left ventricle has no regional wall motion abnormalities. The left ventricular internal cavity size was normal in size. There is  no left ventricular hypertrophy. Left ventricular diastolic parameters are  consistent with Grade I diastolic dysfunction (impaired relaxation). Normal left ventricular filling pressure. Right Ventricle: The right ventricular size is normal. No increase in right ventricular wall thickness. Right ventricular systolic function is normal. There is normal pulmonary artery systolic pressure. The tricuspid regurgitant velocity is 2.42 m/s, and  with an assumed right atrial pressure of 3 mmHg, the estimated right ventricular systolic pressure is 26.4 mmHg. Left Atrium: Left atrial size was normal in size. Right Atrium: Right atrial size was normal in size. Pericardium: There is no evidence of pericardial effusion. Mitral Valve: The mitral valve is normal in structure. No evidence of mitral valve regurgitation. No evidence of mitral valve stenosis. Tricuspid Valve: The tricuspid valve is normal in structure. Tricuspid valve regurgitation is mild . No evidence of tricuspid stenosis. Aortic Valve: The aortic valve is tricuspid. Aortic valve regurgitation is not visualized. No aortic stenosis is present. Aortic valve peak gradient measures 7.8 mmHg. Pulmonic Valve: The pulmonic valve was normal in structure. Pulmonic valve regurgitation is not visualized. No evidence of pulmonic stenosis. Aorta: The aortic root is normal in size and structure. Venous: The inferior vena cava is normal in size with greater than 50% respiratory variability, suggesting right atrial pressure of 3 mmHg. IAS/Shunts: No atrial level shunt detected by color flow Doppler.  LEFT VENTRICLE PLAX 2D LVIDd:         4.30 cm Diastology LVIDs:         3.00 cm LV e' medial:    7.72 cm/s LV PW:         1.10 cm LV E/e' medial:  7.3 LV IVS:        0.90 cm LV e' lateral:   13.90 cm/s                        LV E/e' lateral: 4.0  RIGHT VENTRICLE RV Basal diam:  2.50 cm RV S prime:     14.90 cm/s  TAPSE (M-mode): 1.8 cm LEFT ATRIUM             Index        RIGHT ATRIUM           Index LA diam:        3.30 cm 1.51 cm/m   RA Area:     10.90 cm  LA Vol (A2C):   39.4 ml 18.04 ml/m  RA Volume:   22.70 ml  10.39 ml/m LA Vol (A4C):   33.8 ml 15.47 ml/m LA Biplane Vol: 36.4 ml 16.66 ml/m  AORTIC VALVE              PULMONIC VALVE AV Vmax:      140.00 cm/s PV Vmax:       0.84 m/s AV Peak Grad: 7.8 mmHg    PV Peak grad:  2.8 mmHg LVOT Vmax:    96.60 cm/s LVOT Vmean:   64.600 cm/s LVOT VTI:     0.188 m MITRAL VALVE               TRICUSPID VALVE MV Area (PHT): 3.45 cm    TR Peak grad:   23.4 mmHg MV Decel Time: 220 msec    TR Vmax:        242.00 cm/s MV E velocity: 56.10 cm/s MV A velocity: 67.60 cm/s  SHUNTS MV E/A ratio:  0.83        Systemic VTI: 0.19 m Chilton Si MD Electronically signed by Chilton Si MD Signature Date/Time: 01/19/2023/8:56:56 AM    Final    CT Angio Chest Aorta W and/or Wo Contrast  Result Date: 01/17/2023 CLINICAL DATA:  Acute onset chest pain after her water aerobics class. EXAM: CT ANGIOGRAPHY CHEST WITH CONTRAST TECHNIQUE: Multidetector CT imaging of the chest was performed using the standard protocol during bolus administration of intravenous contrast. Multiplanar CT image reconstructions and MIPs were obtained to evaluate the vascular anatomy. RADIATION DOSE REDUCTION: This exam was performed according to the departmental dose-optimization program which includes automated exposure control, adjustment of the mA and/or kV according to patient size and/or use of iterative reconstruction technique. CONTRAST:  60mL OMNIPAQUE IOHEXOL 350 MG/ML SOLN COMPARISON:  Chest x-ray 01/17/2023 earlier. Noncontrast chest CT 04/15/2022 and older. FINDINGS: Cardiovascular: No aortic dissection or aneurysm formation. Mild atherosclerotic plaque. Diameter of the ascending aorta proximally approaches 2.8 cm. Diameter of the mid ascending aorta at the level of the main pulmonary artery measures 3.4 x 3.5 cm. Diameter of the aortic arch approaches 2.3 cm. And diameter of the mid descending thoracic aorta measures 2.6 by 2.6 cm. Heart is  nonenlarged. Trace pericardial fluid. Mediastinum/Nodes: Preserved thyroid gland. The esophagus has a normal course and caliber. No abnormal lymph node enlargement identified in the axillary regions. There are several enlarged mediastinal hilar nodes. Several these have calcifications. Example right hilar node which has a previous short axis of 19 mm, today when measured in the similar fashion measures 19 mm on series 4, image 72. Precarinal node previously has a diameter in short axis of 14 mm and today when measured in the same fashion on series 4, image 59 14 mm. Other nodes are also similar including subcarinal and left hilar. Lungs/Pleura: No pneumothorax or effusion. There are areas of reticular and nodular changes the lungs as well as some subtle ground-glass patchy areas and cavitary lesions. Extent and distribution is similar to previous. Example noncalcified non cavitary nodule left upper lobe which measured 4 mm, today measures 5 mm  on series 6, image 52. Lesion seen more caudal in the left upper lobe trauma lingula which previously measured 8 mm in maximal dimension, today measures 8 mm on series 6, image 73. Upper Abdomen: Multilobular kidneys with the areas of atrophy and dilatation with cystic areas and stones. Surgical changes from gastric bypass. Musculoskeletal: Scattered degenerative changes along the spine. Diffuse scattered bridging endplate osteophytes and syndesmophytes. Review of the MIP images confirms the above findings. IMPRESSION: Mild atherosclerotic calcified plaque. No aortic dissection or aneurysm formation. Once again there are several enlarged partially calcified lymph nodes in the hilum and mediastinum as well as reticulonodular changes the lungs with cavitary and non cavitary ill-defined areas of nodularity. Overall the distribution is similar to previous examination. Please correlate for known history and recommend continued follow-up in 6-12 months to ensure long-term stability.  Aortic Atherosclerosis (ICD10-I70.0). Electronically Signed   By: Karen Kays M.D.   On: 01/17/2023 14:48   DG Chest 2 View  Result Date: 01/17/2023 CLINICAL DATA:  Chest pain after water aerobics class. High midsternal pain. EXAM: CHEST - 2 VIEW COMPARISON:  Chest radiographs 09/10/2021 and 09/07/2019; CT chest 04/15/2022 FINDINGS: Cardiac silhouette and mediastinal contours within normal limits. There is again flattening of the diaphragms and moderate hyperinflation. There are lucencies within the bilateral upper lungs, consistent with a bronchiectasis seen on prior CT. No acute airspace opacity is seen. No pulmonary edema, pleural effusion, or pneumothorax. Right apical pleural-parenchymal scarring and region of cystic bronchiectasis on prior CT, unchanged from prior radiographs. Moderate multilevel degenerative disc changes of the thoracic spine. Left upper abdominal surgical clips. IMPRESSION: 1. No acute airspace opacity. 2. Chronic hyperinflation and bilateral upper lobe bronchiectasis. 3. Right apical pleural-parenchymal scarring and region of cystic bronchiectasis, unchanged from prior radiographs and CT. Electronically Signed   By: Neita Garnet M.D.   On: 01/17/2023 12:28   MR BRAIN WO CONTRAST  Result Date: 01/07/2023 CLINICAL DATA:  Headaches.  Facial numbness. EXAM: MRI HEAD WITHOUT CONTRAST TECHNIQUE: Multiplanar, multiecho pulse sequences of the brain and surrounding structures were obtained without intravenous contrast. COMPARISON:  Head MRI 09/14/2010 FINDINGS: Brain: There is no evidence of an acute infarct, intracranial hemorrhage, mass, midline shift, or extra-axial fluid collection. A cavum septum pellucidum et vergae is noted, a normal variant. Mild generalized cerebral atrophy is within normal limits for age. T2 hyperintensities in the biparietal white matter are unchanged and nonspecific but compatible with minimal chronic small vessel ischemic disease. A partially empty sella is  again seen. The cerebellar tonsils are normally positioned. Vascular: Major intracranial vascular flow voids are preserved. Skull and upper cervical spine: Unremarkable bone marrow signal. Sinuses/Orbits: Chronic bilateral proptosis. Depression of the left lamina papyracea which may reflect a remote fracture. Clear paranasal sinuses. No significant mastoid fluid. Other: 7 mm T2 hyperintense/potentially cystic focus in the left parotid gland, not clearly present on the prior MRI. IMPRESSION: 1. No acute intracranial abnormality. 2. Minimal chronic small vessel ischemic disease. 3. Partially empty sella. 4. Nonspecific subcentimeter nodule or cyst in the left parotid gland. Electronically Signed   By: Sebastian Ache M.D.   On: 01/07/2023 16:45    Microbiology: No results found for this or any previous visit (from the past 240 hour(s)).   Labs: Basic Metabolic Panel: Recent Labs  Lab 01/17/23 1132 01/18/23 0531  NA 138 138  K 4.3 4.4  CL 103 106  CO2 21* 25  GLUCOSE 168* 90  BUN 21 17  CREATININE 1.64* 1.43*  CALCIUM  9.2 9.1   Liver Function Tests: No results for input(s): "AST", "ALT", "ALKPHOS", "BILITOT", "PROT", "ALBUMIN" in the last 168 hours. No results for input(s): "LIPASE", "AMYLASE" in the last 168 hours. No results for input(s): "AMMONIA" in the last 168 hours. CBC: Recent Labs  Lab 01/17/23 1132 01/18/23 0531  WBC 7.3 3.9*  HGB 13.0 11.6*  HCT 40.7 35.4*  MCV 92.3 91.2  PLT 218 185   Cardiac Enzymes: No results for input(s): "CKTOTAL", "CKMB", "CKMBINDEX", "TROPONINI" in the last 168 hours. BNP: BNP (last 3 results) No results for input(s): "BNP" in the last 8760 hours.  ProBNP (last 3 results) No results for input(s): "PROBNP" in the last 8760 hours.  CBG: No results for input(s): "GLUCAP" in the last 168 hours.     Signed:  Silvano Bilis MD.  [facility] 01/20/2023, 2:54 PM

## 2023-01-20 NOTE — Progress Notes (Signed)
PROGRESS NOTE    Melissa [name]  ZOX:[id number] DOB: [birth year] DOA: 01/17/2023 PCP: Melissa Cory, MD   Brief Narrative:  This 72 years old female with PMH significant for hypertension, hyperlipidemia, asthma, OSA on CPAP, CKD stage IIIb, sarcoidosis, sickle cell trait, s/p Roxanne Y gastric bypass, depression presented in the ED with complaints of chest pain.  Patient has completed water aerobics training and felt like her heart was persistently elevated.  She denies any palpitation or feeling of heart racing.  Later in the day she has developed chest pain on the right side pressure-like radiating towards the back.  ED workup reveals elevated troponins stable renal functions.  CTA negative for dissection.  Patient admitted for further evaluation,  Cardiology is consulted.  Patient is scheduled for left heart cath on Monday.  Assessment & Plan:   Principal Problem:   Chest pain Active Problems:   HTN (hypertension)   HLD (hyperlipidemia)   Stage 3b chronic kidney disease (HCC)   Sarcoidosis of lung (HCC)   Chronic bronchitis (HCC)   Asthma   Depression   OSA on CPAP   Unstable angina (HCC)   Elevated troponin  Chest pain: Patient presented with chest pain following water aerobic training. Troponin slightly elevated at 33 > 35 > 28 Concern for unstable angina CTA negative for dissection. No respiratory symptoms to suggest PE.  Cardiology is consulted. Continue , aspirin, Lipitor and nitroglycerin as needed. TTE unremarkable Cardiology evaluated and recommended left heart cath scheduled for this afternoon  Tachycardia Resolved. Sinus on tele. No anemia. Tsh wnl - consider cardiac monitor at discharge  Essential hypertension: Continue amlodipine, Cozaar IV hydralazine as needed.  Hyperlipidemia Continue Lipitor and Zetia.  CKD stage IIIb. Serum creatinine at baseline.   Avoid nephrotoxic medications.  Sarcoidosis of lung: Patient is following with Dr. Belia Heman  pulmonology as an outpatient. Outpatient follow up.  Pulmonary nodules Mediastinal lymph nodes Incidental on ct of chest, described as stable to improved, radiology advising 6-12 mo f/u  Chronic bronchitis and asthma: Continue as needed bronchodilators.  Depression: Continue home medications  OSA on CPAP.   DVT prophylaxis: Lovenox Code Status: Full code. Family Communication: No family at bed side. Disposition Plan:   Status is: inpt    Consultants:  Cardiology  Procedures: LHC pending  Antimicrobials: Anti-infectives (From admission, onward)    None      Subjective: Patient was seen and examined at bedside.  Overnight events noted.   Had another episode of mild chest pain this morning    Objective: Vitals:   01/20/23 0727 01/20/23 0817 01/20/23 1148 01/20/23 1242  BP:  112/81 116/71 125/78  Pulse:  86 79 80  Resp:  16 20 20   Temp:  97.8 F (36.6 C) 98.1 F (36.7 C) 98.2 F (36.8 C)  TempSrc:   Oral Oral  SpO2: 98% 98% 100% 99%  Weight:    96.2 kg  Height:    6' (1.829 m)    Intake/Output Summary (Last 24 hours) at 01/20/2023 1315 Last data filed at 01/19/2023 2000 Gross per 24 hour  Intake 400 ml  Output --  Net 400 ml    Filed Weights   01/17/23 1229 01/20/23 1242  Weight: 96.2 kg 96.2 kg    Examination:  General exam: Appears calm and comfortable, not in any acute distress.  Deconditioned Respiratory system: Clear to auscultation. Respiratory effort normal.  RR 13 Cardiovascular system: S1 & S2 heard, RRR. No JVD, murmurs, rubs, gallops or clicks. No  pedal edema. Gastrointestinal system: Abdomen is soft, non tender, non distended, BS+ Central nervous system: Alert and oriented x 3. No focal neurological deficits. Extremities: No edema, No cyanosis, No clubbing Skin: No rashes, lesions or ulcers Psychiatry: Judgement and insight appear normal. Mood & affect appropriate.     Data Reviewed: I have personally reviewed following labs and  imaging studies  CBC: Recent Labs  Lab 01/17/23 1132 01/18/23 0531  WBC 7.3 3.9*  HGB 13.0 11.6*  HCT 40.7 35.4*  MCV 92.3 91.2  PLT 218 185   Basic Metabolic Panel: Recent Labs  Lab 01/17/23 1132 01/18/23 0531  NA 138 138  K 4.3 4.4  CL 103 106  CO2 21* 25  GLUCOSE 168* 90  BUN 21 17  CREATININE 1.64* 1.43*  CALCIUM 9.2 9.1   GFR: Estimated Creatinine Clearance: 48.2 mL/min (A) (by C-G formula based on SCr of 1.43 mg/dL (H)). Liver Function Tests: No results for input(s): "AST", "ALT", "ALKPHOS", "BILITOT", "PROT", "ALBUMIN" in the last 168 hours. No results for input(s): "LIPASE", "AMYLASE" in the last 168 hours. No results for input(s): "AMMONIA" in the last 168 hours. Coagulation Profile: Recent Labs  Lab 01/17/23 1551  INR 1.0   Cardiac Enzymes: No results for input(s): "CKTOTAL", "CKMB", "CKMBINDEX", "TROPONINI" in the last 168 hours. BNP (last 3 results) No results for input(s): "PROBNP" in the last 8760 hours. HbA1C: No results for input(s): "HGBA1C" in the last 72 hours. CBG: No results for input(s): "GLUCAP" in the last 168 hours. Lipid Profile: Recent Labs    01/18/23 0531  CHOL 134  HDL 72  LDLCALC 54  TRIG 42  CHOLHDL 1.9   Thyroid Function Tests: Recent Labs    01/18/23 0531  TSH 4.208   Anemia Panel: No results for input(s): "VITAMINB12", "FOLATE", "FERRITIN", "TIBC", "IRON", "RETICCTPCT" in the last 72 hours. Sepsis Labs: No results for input(s): "PROCALCITON", "LATICACIDVEN" in the last 168 hours.  No results found for this or any previous visit (from the past 240 hour(s)).   Radiology Studies: ECHOCARDIOGRAM COMPLETE  Result Date: 01/19/2023    ECHOCARDIOGRAM REPORT   Patient Name:   Melissa [name] Date of Exam: 01/18/2023 Medical Rec #:  [id number]        Height:       72.0 in Accession #:    [id number]       Weight:       212.1 lb Date of Birth:  1951-07-18        BSA:          2.184 m Patient Age:    71 years         BP:            139/81 mmHg Patient Gender: F                HR:           72 bpm. Exam Location:  [facility] Procedure: 2D Echo Indications:     Chest Pain R07.9  History:         Patient has prior history of Echocardiogram examinations, most                  recent 04/18/2020.  Sonographer:     Overton Mam RDCS, FASE Referring Phys:  [id number] Francee Nodal FURTH Diagnosing Phys: Chilton Si MD IMPRESSIONS  1. Left ventricular ejection fraction, by estimation, is 60 to 65%. The left ventricle has normal function. The left ventricle has no regional wall  motion abnormalities. Left ventricular diastolic parameters are consistent with Grade I diastolic dysfunction (impaired relaxation).  2. Right ventricular systolic function is normal. The right ventricular size is normal. There is normal pulmonary artery systolic pressure.  3. The mitral valve is normal in structure. No evidence of mitral valve regurgitation. No evidence of mitral stenosis.  4. The aortic valve is tricuspid. Aortic valve regurgitation is not visualized. No aortic stenosis is present.  5. The inferior vena cava is normal in size with greater than 50% respiratory variability, suggesting right atrial pressure of 3 mmHg. FINDINGS  Left Ventricle: Left ventricular ejection fraction, by estimation, is 60 to 65%. The left ventricle has normal function. The left ventricle has no regional wall motion abnormalities. The left ventricular internal cavity size was normal in size. There is  no left ventricular hypertrophy. Left ventricular diastolic parameters are consistent with Grade I diastolic dysfunction (impaired relaxation). Normal left ventricular filling pressure. Right Ventricle: The right ventricular size is normal. No increase in right ventricular wall thickness. Right ventricular systolic function is normal. There is normal pulmonary artery systolic pressure. The tricuspid regurgitant velocity is 2.42 m/s, and  with an assumed right atrial pressure of 3 mmHg, the  estimated right ventricular systolic pressure is 26.4 mmHg. Left Atrium: Left atrial size was normal in size. Right Atrium: Right atrial size was normal in size. Pericardium: There is no evidence of pericardial effusion. Mitral Valve: The mitral valve is normal in structure. No evidence of mitral valve regurgitation. No evidence of mitral valve stenosis. Tricuspid Valve: The tricuspid valve is normal in structure. Tricuspid valve regurgitation is mild . No evidence of tricuspid stenosis. Aortic Valve: The aortic valve is tricuspid. Aortic valve regurgitation is not visualized. No aortic stenosis is present. Aortic valve peak gradient measures 7.8 mmHg. Pulmonic Valve: The pulmonic valve was normal in structure. Pulmonic valve regurgitation is not visualized. No evidence of pulmonic stenosis. Aorta: The aortic root is normal in size and structure. Venous: The inferior vena cava is normal in size with greater than 50% respiratory variability, suggesting right atrial pressure of 3 mmHg. IAS/Shunts: No atrial level shunt detected by color flow Doppler.  LEFT VENTRICLE PLAX 2D LVIDd:         4.30 cm Diastology LVIDs:         3.00 cm LV e' medial:    7.72 cm/s LV PW:         1.10 cm LV E/e' medial:  7.3 LV IVS:        0.90 cm LV e' lateral:   13.90 cm/s                        LV E/e' lateral: 4.0  RIGHT VENTRICLE RV Basal diam:  2.50 cm RV S prime:     14.90 cm/s TAPSE (M-mode): 1.8 cm LEFT ATRIUM             Index        RIGHT ATRIUM           Index LA diam:        3.30 cm 1.51 cm/m   RA Area:     10.90 cm LA Vol (A2C):   39.4 ml 18.04 ml/m  RA Volume:   22.70 ml  10.39 ml/m LA Vol (A4C):   33.8 ml 15.47 ml/m LA Biplane Vol: 36.4 ml 16.66 ml/m  AORTIC VALVE  PULMONIC VALVE AV Vmax:      140.00 cm/s PV Vmax:       0.84 m/s AV Peak Grad: 7.8 mmHg    PV Peak grad:  2.8 mmHg LVOT Vmax:    96.60 cm/s LVOT Vmean:   64.600 cm/s LVOT VTI:     0.188 m MITRAL VALVE               TRICUSPID VALVE MV Area (PHT):  3.45 cm    TR Peak grad:   23.4 mmHg MV Decel Time: 220 msec    TR Vmax:        242.00 cm/s MV E velocity: 56.10 cm/s MV A velocity: 67.60 cm/s  SHUNTS MV E/A ratio:  0.83        Systemic VTI: 0.19 m Chilton Si MD Electronically signed by Chilton Si MD Signature Date/Time: 01/19/2023/8:56:56 AM    Final     Scheduled Meds:  [MAR Hold] amLODipine  5 mg Oral Daily   [MAR Hold] vitamin C  1,000 mg Oral Daily   [START ON 01/21/2023] aspirin  81 mg Oral Pre-Cath   [MAR Hold] aspirin EC  81 mg Oral Daily   [MAR Hold] atorvastatin  40 mg Oral Daily   [MAR Hold] budesonide  0.5 mg Nebulization BID   [MAR Hold] enoxaparin (LOVENOX) injection  40 mg Subcutaneous Q24H   [MAR Hold] ezetimibe  10 mg Oral Daily   [MAR Hold] ferrous sulfate  325 mg Oral Q breakfast   [MAR Hold] fluticasone furoate-vilanterol  1 puff Inhalation Daily   And   [MAR Hold] umeclidinium bromide  1 puff Inhalation Daily   [MAR Hold] gabapentin  100 mg Oral Q2000   [MAR Hold] gabapentin  200 mg Oral QHS   [MAR Hold] loratadine  10 mg Oral Daily   [MAR Hold] losartan  50 mg Oral Daily   [MAR Hold] magnesium oxide  400 mg Oral Daily   [MAR Hold] multivitamin with minerals  1 tablet Oral Daily   [MAR Hold] nortriptyline  10-20 mg Oral QHS   [MAR Hold] saccharomyces boulardii  250 mg Oral Daily   [MAR Hold] sodium chloride flush  3 mL Intravenous Q12H   [MAR Hold] zinc sulfate  220 mg Oral Daily   Continuous Infusions:  sodium chloride     sodium chloride       LOS: 2 days    Time spent: 35 mins    Silvano Bilis, MD [facility]   If 7PM-7AM, please contact night-coverage

## 2023-01-20 NOTE — Progress Notes (Signed)
Upon return to specials recovery, RN able to perform "pleth" with O2 sensor on thumb. TR band intact with 1+ swelling just distal to band. 1 ml air added to TR band by Fayrene Fearing, Best boy. From cath lab. Radial and ulner pulses 2+ palpable on arrival.

## 2023-01-20 NOTE — Progress Notes (Signed)
Unable to obtain Pleth waveform on pt. Right hand. Right Radial pulse :2+, rt. Ulnar 1+, right brachial 1+ on palpation. Pt. Has "powder/gel nails bilat. Unable to remove polish with adhesive pad remover. Pt. States "you have to drill it off at the nail salon." Cath lab and MD made aware of pt. Status. With manual pressure, Radial artery patency is a "D.". After 20+ seconds, no return of waveform.

## 2023-01-20 NOTE — Interval H&P Note (Signed)
History and Physical Interval Note:  01/20/2023 2:11 PM  Melissa [name]  has presented today for surgery, with the diagnosis of chest pain.  The various methods of treatment have been discussed with the patient and family. After consideration of risks, benefits and other options for treatment, the patient has consented to  Procedure(s): LEFT HEART CATH AND CORONARY ANGIOGRAPHY (N/A) as a surgical intervention.  The patient's history has been reviewed, patient examined, no change in status, stable for surgery.  I have reviewed the patient's chart and labs.  Questions were answered to the patient's satisfaction.     Lorine Bears

## 2023-01-20 NOTE — Progress Notes (Addendum)
Cardiology Progress Note   Patient Name: Melissa [name] Date of Encounter: 01/20/2023  Primary Cardiologist: None - new, seen by Duke Salvia  Subjective   No chest pain or sob.  NPO for cath today.  Inpatient Medications    Scheduled Meds:  amLODipine  5 mg Oral Daily   vitamin C  1,000 mg Oral Daily   aspirin EC  81 mg Oral Daily   atorvastatin  40 mg Oral Daily   budesonide  0.5 mg Nebulization BID   enoxaparin (LOVENOX) injection  40 mg Subcutaneous Q24H   ezetimibe  10 mg Oral Daily   ferrous sulfate  325 mg Oral Q breakfast   fluticasone furoate-vilanterol  1 puff Inhalation Daily   And   umeclidinium bromide  1 puff Inhalation Daily   gabapentin  100 mg Oral Q2000   gabapentin  200 mg Oral QHS   loratadine  10 mg Oral Daily   losartan  50 mg Oral Daily   magnesium oxide  400 mg Oral Daily   multivitamin with minerals  1 tablet Oral Daily   nortriptyline  10-20 mg Oral QHS   saccharomyces boulardii  250 mg Oral Daily   sodium chloride flush  3 mL Intravenous Q12H   zinc sulfate  220 mg Oral Daily   Continuous Infusions:  [START ON 01/21/2023] sodium chloride     Followed by   Melene Muller ON 01/21/2023] sodium chloride     PRN Meds: acetaminophen, albuterol, dextromethorphan-guaiFENesin, hydrALAZINE, methocarbamol, nitroGLYCERIN, ondansetron (ZOFRAN) IV   Vital Signs    Vitals:   01/19/23 2309 01/20/23 0613 01/20/23 0727 01/20/23 0817  BP: 106/76 109/75  112/81  Pulse: 77 100  86  Resp: 18 18  16   Temp: 98 F (36.7 C) 97.9 F (36.6 C)  97.8 F (36.6 C)  TempSrc:      SpO2: 100% 99% 98% 98%  Weight:      Height:        Intake/Output Summary (Last 24 hours) at 01/20/2023 1104 Last data filed at 01/19/2023 2000 Gross per 24 hour  Intake 400 ml  Output --  Net 400 ml   Filed Weights   01/17/23 1229  Weight: 96.2 kg    Physical Exam   GEN: Well nourished, well developed, in no acute distress.  HEENT: Grossly normal.  Neck: Supple, no JVD, carotid  bruits, or masses. Cardiac: RRR, no murmurs, rubs, or gallops. No clubbing, cyanosis, edema.  Radials 2+, DP/PT 2+ and equal bilaterally.  Respiratory:  Respirations regular and unlabored, bibasilar crackles. GI: Soft, nontender, nondistended, BS + x 4. MS: no deformity or atrophy. Skin: warm and dry, no rash. Neuro:  Strength and sensation are intact. Psych: AAOx3.  Normal affect.  Labs    Chemistry Recent Labs  Lab 01/17/23 1132 01/18/23 0531  NA 138 138  K 4.3 4.4  CL 103 106  CO2 21* 25  GLUCOSE 168* 90  BUN 21 17  CREATININE 1.64* 1.43*  CALCIUM 9.2 9.1  GFRNONAA 34* 40*  ANIONGAP 14 7     Hematology Recent Labs  Lab 01/17/23 1132 01/18/23 0531  WBC 7.3 3.9*  RBC 4.41 3.88  HGB 13.0 11.6*  HCT 40.7 35.4*  MCV 92.3 91.2  MCH 29.5 29.9  MCHC 31.9 32.8  RDW 12.4 12.5  PLT 218 185    Cardiac Enzymes  Recent Labs  Lab 01/17/23 1132 01/17/23 1551 01/17/23 1859 01/17/23 2056  TROPONINIHS 33* 35* 27* 28*  Lipids  Lab Results  Component Value Date   CHOL 134 01/18/2023   HDL 72 01/18/2023   LDLCALC 54 01/18/2023   TRIG 42 01/18/2023   CHOLHDL 1.9 01/18/2023    HbA1c  Lab Results  Component Value Date   HGBA1C 5.9 (H) 01/17/2023    Radiology    CT Angio Chest Aorta W and/or Wo Contrast  Result Date: 01/17/2023 CLINICAL DATA:  Acute onset chest pain after her water aerobics class. EXAM: CT ANGIOGRAPHY CHEST WITH CONTRAST TECHNIQUE: Multidetector CT imaging of the chest was performed using the standard protocol during bolus administration of intravenous contrast. Multiplanar CT image reconstructions and MIPs were obtained to evaluate the vascular anatomy. RADIATION DOSE REDUCTION: This exam was performed according to the departmental dose-optimization program which includes automated exposure control, adjustment of the mA and/or kV according to patient size and/or use of iterative reconstruction technique. CONTRAST:  60mL OMNIPAQUE IOHEXOL 350  MG/ML SOLN COMPARISON:  Chest x-ray 01/17/2023 earlier. Noncontrast chest CT 04/15/2022 and older. FINDINGS: Cardiovascular: No aortic dissection or aneurysm formation. Mild atherosclerotic plaque. Diameter of the ascending aorta proximally approaches 2.8 cm. Diameter of the mid ascending aorta at the level of the main pulmonary artery measures 3.4 x 3.5 cm. Diameter of the aortic arch approaches 2.3 cm. And diameter of the mid descending thoracic aorta measures 2.6 by 2.6 cm. Heart is nonenlarged. Trace pericardial fluid. Mediastinum/Nodes: Preserved thyroid gland. The esophagus has a normal course and caliber. No abnormal lymph node enlargement identified in the axillary regions. There are several enlarged mediastinal hilar nodes. Several these have calcifications. Example right hilar node which has a previous short axis of 19 mm, today when measured in the similar fashion measures 19 mm on series 4, image 72. Precarinal node previously has a diameter in short axis of 14 mm and today when measured in the same fashion on series 4, image 59 14 mm. Other nodes are also similar including subcarinal and left hilar. Lungs/Pleura: No pneumothorax or effusion. There are areas of reticular and nodular changes the lungs as well as some subtle ground-glass patchy areas and cavitary lesions. Extent and distribution is similar to previous. Example noncalcified non cavitary nodule left upper lobe which measured 4 mm, today measures 5 mm on series 6, image 52. Lesion seen more caudal in the left upper lobe trauma lingula which previously measured 8 mm in maximal dimension, today measures 8 mm on series 6, image 73. Upper Abdomen: Multilobular kidneys with the areas of atrophy and dilatation with cystic areas and stones. Surgical changes from gastric bypass. Musculoskeletal: Scattered degenerative changes along the spine. Diffuse scattered bridging endplate osteophytes and syndesmophytes. Review of the MIP images confirms the  above findings. IMPRESSION: Mild atherosclerotic calcified plaque. No aortic dissection or aneurysm formation. Once again there are several enlarged partially calcified lymph nodes in the hilum and mediastinum as well as reticulonodular changes the lungs with cavitary and non cavitary ill-defined areas of nodularity. Overall the distribution is similar to previous examination. Please correlate for known history and recommend continued follow-up in 6-12 months to ensure long-term stability. Aortic Atherosclerosis (ICD10-I70.0). Electronically Signed   By: Karen Kays M.D.   On: 01/17/2023 14:48   DG Chest 2 View  Result Date: 01/17/2023 CLINICAL DATA:  Chest pain after water aerobics class. High midsternal pain. EXAM: CHEST - 2 VIEW COMPARISON:  Chest radiographs 09/10/2021 and 09/07/2019; CT chest 04/15/2022 FINDINGS: Cardiac silhouette and mediastinal contours within normal limits. There is again flattening of the  diaphragms and moderate hyperinflation. There are lucencies within the bilateral upper lungs, consistent with a bronchiectasis seen on prior CT. No acute airspace opacity is seen. No pulmonary edema, pleural effusion, or pneumothorax. Right apical pleural-parenchymal scarring and region of cystic bronchiectasis on prior CT, unchanged from prior radiographs. Moderate multilevel degenerative disc changes of the thoracic spine. Left upper abdominal surgical clips. IMPRESSION: 1. No acute airspace opacity. 2. Chronic hyperinflation and bilateral upper lobe bronchiectasis. 3. Right apical pleural-parenchymal scarring and region of cystic bronchiectasis, unchanged from prior radiographs and CT. Electronically Signed   By: Neita Garnet M.D.   On: 01/17/2023 12:28    Telemetry    RSR - Personally Reviewed  Cardiac Studies   2D Echocardiogram 6.1.2024   1. Left ventricular ejection fraction, by estimation, is 60 to 65%. The  left ventricle has normal function. The left ventricle has no regional   wall motion abnormalities. Left ventricular diastolic parameters are  consistent with Grade I diastolic  dysfunction (impaired relaxation).   2. Right ventricular systolic function is normal. The right ventricular  size is normal. There is normal pulmonary artery systolic pressure.   3. The mitral valve is normal in structure. No evidence of mitral valve  regurgitation. No evidence of mitral stenosis.   4. The aortic valve is tricuspid. Aortic valve regurgitation is not  visualized. No aortic stenosis is present.   5. The inferior vena cava is normal in size with greater than 50%  respiratory variability, suggesting right atrial pressure of 3 mmHg.   Patient Profile     72 y.o. female with a hx of atherosclerosis of the aorta, hyperlipidemia, depression, obesity status post gastric bypass, sarcoidosis in the lung and MSK, CKD stage III, OSA on CPAP, RLS, sickle cell trait, cervical radiculopathy, chronic back pain, who was admitted 5/31 w/ chest pain and mild hsTrop elevation (peak 35).  Assessment & Plan    1.  Precordial Chest Pain/Elevated hsTroponin:  Presented w/ intermittent rest and exertional chest pain in the setting of chronic pain and sickle cell trait.  Trop mildly elevated @ 33  35  27  28.  Echo w/ nl EF, no rwma.  No c/p this AM.  For cath today.  Cont asa, statin, ccb, arb, zetia.  2.  Primary HTN: Stable on ARB and ccb.  3.  HL:  LDL 54.  cont statin/zetia.  4.  CKD III:  Creat stable on last check - 6/1 - 1.43.  5.  OSA:  Using CPAP.  Signed, Nicolasa Ducking, NP  01/20/2023, 11:04 AM    For questions or updates, please contact   Please consult [email or web address] for contact info under Cardiology/STEMI.

## 2023-01-21 ENCOUNTER — Encounter: Payer: Self-pay | Admitting: Cardiovascular Disease

## 2023-01-21 ENCOUNTER — Telehealth: Payer: Self-pay | Admitting: *Deleted

## 2023-01-21 LAB — MISC LABCORP TEST (SEND OUT): Labcorp test code: 83935

## 2023-01-21 NOTE — Transitions of Care (Post Inpatient/ED Visit) (Signed)
   01/21/2023  Name: MADASYN GAN MRN: [id number] DOB: November 24, 1950  Today's TOC FU Call Status: Today's TOC FU Call Status:: Unsuccessul Call (1st Attempt) Unsuccessful Call (1st Attempt) Date: 01/21/23  Attempted to reach the patient regarding the most recent Inpatient/ED visit.  Follow Up Plan: Additional outreach attempts will be made to reach the patient to complete the Transitions of Care (Post Inpatient/ED visit) call.   Gean Maidens BSN RN [facility] [phone]

## 2023-01-22 ENCOUNTER — Telehealth: Payer: Self-pay | Admitting: *Deleted

## 2023-01-22 NOTE — Transitions of Care (Post Inpatient/ED Visit) (Signed)
01/22/2023  Name: Melissa [name] MRN: [id number] DOB: 02-15-1951  Today's TOC FU Call Status: Today's TOC FU Call Status:: Successful TOC FU Call Competed TOC FU Call Complete Date: 01/22/23  Transition Care Management Follow-up Telephone Call Date of Discharge: 01/20/23 Discharge Facility: [facility]) Type of Discharge: Inpatient Admission Primary Inpatient Discharge Diagnosis:: chest pain How have you been since you were released from the hospital?: Better Any questions or concerns?: No  Items Reviewed: Did you receive and understand the discharge instructions provided?: Yes Medications obtained,verified, and reconciled?: Yes (Medications Reviewed) Any new allergies since your discharge?: No Dietary orders reviewed?: No Do you have support at home?: Yes People in Home: alone Name of Support/Comfort Primary Source: Jasmine  Medications Reviewed Today: Medications Reviewed Today     Reviewed by Shon Hale, CPhT (Pharmacy Technician) on 01/17/23 at 1542  Med List Status: Complete   Medication Order Taking? Sig Documenting Provider Last Dose Status Informant  acetaminophen (TYLENOL) 650 MG CR tablet [id number] Yes Take 1,300 mg by mouth every 8 (eight) hours as needed for pain. [provider] unk Active Self  albuterol (VENTOLIN HFA) 108 (90 Base) MCG/ACT inhaler [id number] Yes INHALE 1-2 PUFFS BY MOUTH EVERY 6 HOURS AS NEEDED FOR WHEEZE OR SHORTNESS OF Ramond Dial, Danna Hefty, MD unk Active   amLODipine (NORVASC) 5 MG tablet [id number] Yes Take 5 mg by mouth daily. [facility] 01/17/2023 Active   Ascorbic Acid (VITAMIN C) 1000 MG tablet [id number] Yes Take 1,000 mg by mouth daily. [provider] 01/17/2023 Active   aspirin EC 81 MG tablet [id number] Yes Take 1 tablet by mouth daily. [provider] 01/17/2023 Active   atorvastatin (LIPITOR) 40 MG tablet [id number] Yes TAKE 1 TABLET BY MOUTH EVERY DAY  Alba Cory, MD 01/17/2023 Active   azelastine (ASTELIN) 0.1 % nasal spray [id number] Yes Place 2 sprays into both nostrils 2 (two) times daily. Use in each nostril as directed Bevelyn Ngo, NP unk Active   budesonide (PULMICORT) 0.5 MG/2ML nebulizer solution [id number] Yes Take 2 mLs (0.5 mg total) by nebulization in the morning and at bedtime. Bevelyn Ngo, NP 01/17/2023 Active   calcium carbonate (TUMS - DOSED IN MG ELEMENTAL CALCIUM) 500 MG chewable tablet [id number] Yes Chew 500-1,000 mg by mouth daily as needed for indigestion or heartburn. [provider] unk Active Self  ezetimibe (ZETIA) 10 MG tablet [id number] Yes TAKE 1 TABLET BY MOUTH EVERY DAY Antonieta Iba, MD 01/17/2023 Active   ferrous sulfate 325 (65 FE) MG tablet [id number] Yes Take 325 mg by mouth daily with breakfast. [provider] 01/17/2023 Active Self  fluticasone (FLONASE) 50 MCG/ACT nasal spray [id number] Yes SPRAY 2 SPRAYS INTO EACH NOSTRIL EVERY DAY Erin Fulling, MD unk Active   gabapentin (NEURONTIN) 100 MG capsule [id number] Yes Take 100-200 mg by mouth 2 (two) times daily. One in am and two at night Morene Crocker, MD 01/17/2023 Active   ipratropium-albuterol (DUONEB) 0.5-2.5 (3) MG/3ML SOLN [id number]  Take 3 mLs by nebulization every 6 (six) hours as needed. Bevelyn Ngo, NP  Active   levocetirizine Elita Boone ALLERGY 24HR) 5 MG tablet [id number] Yes Take 1 tablet (5 mg total) by mouth every evening. Erin Fulling, MD unk Active   losartan (COZAAR) 50 MG tablet [id number] Yes Take 50 mg by mouth daily. [provider] 01/17/2023 Active   magnesium oxide (MAG-OX) 400 MG tablet [id number] Yes Take 400 mg by mouth daily. [provider]  01/17/2023 Active Self  metaxalone (SKELAXIN) 800 MG tablet [id number] Yes Take 1 tablet (800 mg total) by mouth 3 (three) times daily as needed for muscle spasms. Alba Cory, MD unk Active   methocarbamol (ROBAXIN) 500 MG tablet [id number] Yes Take 500 mg by  mouth every 8 (eight) hours as needed for muscle spasms. [provider] unk Active   Multiple Vitamins-Minerals (CENTRUM SILVER) tablet [id number] Yes Take 1 tablet by mouth daily. Alba Cory, MD 01/17/2023 Active Self  nortriptyline (PAMELOR) 10 MG capsule [id number] Yes Take 10-20 mg by mouth at bedtime. Morene Crocker, MD 01/16/2023 Active   Probiotic Product (FORTIFY PROBIOTIC WOMENS EX ST PO) [id number] Yes Take by mouth daily. [provider] 01/17/2023 Active   Dwyane Luo 200-62.5-25 MCG/ACT AEPB [id number] Yes INHALE ONE ACTUATION INTO THE LUNGS DAILY Erin Fulling, MD 01/17/2023 Active   zinc gluconate 50 MG tablet [id number] Yes Take 50 mg by mouth daily. [provider] 01/17/2023 Active             Home Care and Equipment/Supplies: Were Home Health Services Ordered?: NA Any new equipment or medical supplies ordered?: NA  Functional Questionnaire: Do you need assistance with bathing/showering or dressing?: No Do you need assistance with meal preparation?: No Do you need assistance with eating?: No Do you have difficulty maintaining continence: No Do you need assistance with getting out of bed/getting out of a chair/moving?: No Do you have difficulty managing or taking your medications?: No  Follow up appointments reviewed: PCP Follow-up appointment confirmed?: NA Specialist Hospital Follow-up appointment confirmed?: No Reason Specialist Follow-Up Not Confirmed: Patient has Specialist Provider Number and will Call for Appointment (Dr Mariah Milling will call) Do you need transportation to your follow-up appointment?: No Do you understand care options if your condition(s) worsen?: Yes-patient verbalized understanding  SDOH Interventions Today    Flowsheet Row Most Recent Value  SDOH Interventions   Food Insecurity Interventions Intervention Not Indicated  Housing Interventions Intervention Not Indicated  Transportation Interventions Intervention  Not Indicated      Interventions Today    Flowsheet Row Most Recent Value  General Interventions   General Interventions Discussed/Reviewed General Interventions Discussed, General Interventions Reviewed, Doctor Visits  Pharmacy Interventions   Pharmacy Dicussed/Reviewed Pharmacy Topics Discussed, Pharmacy Topics Reviewed      TOC Interventions Today    Flowsheet Row Most Recent Value  TOC Interventions   TOC Interventions Discussed/Reviewed TOC Interventions Discussed, TOC Interventions Reviewed        Gean Maidens BSN RN [facility] [phone]

## 2023-01-22 NOTE — Transitions of Care (Post Inpatient/ED Visit) (Signed)
   01/22/2023  Name: Melissa [name] MRN: [id number] DOB: 01/27/1951  Today's TOC FU Call Status: Today's TOC FU Call Status:: Unsuccessful Call (2nd Attempt) Unsuccessful Call (2nd Attempt) Date: 01/22/23  Attempted to reach the patient regarding the most recent Inpatient/ED visit.  Follow Up Plan: Additional outreach attempts will be made to reach the patient to complete the Transitions of Care (Post Inpatient/ED visit) call.   Gean Maidens BSN RN [facility] [phone]

## 2023-01-30 NOTE — Progress Notes (Signed)
Cardiology Office Note:    Date:  01/31/2023   ID:  [name], DOB [birth year], MRN [id number]  PCP:  Alba Cory, MD  [facility] Cardiologist:  None  [facility] Electrophysiologist:  None   Referring MD: Alba Cory, MD   Chief Complaint: Hospital follow-up  History of Present Illness:    Melissa [name] is a 72 y.o. 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  Myoview in March 2017 showed no ischemia, EF of 57%, overall low risk scan.  Echo in April 2017 showed EF of 35 to 40%, severe hypokinesis of the mid and apical anteroseptal and anterior myocardium, grade 1 diastolic dysfunction, mild TR.  Follow-up limited echo in April 2017 showed EF of 40 to 45%, mild concentric LVH, normal left atrium.  CT renal stone protocol from March 2018 showed aortic atherosclerosis.  In March 2020 patient was started on hydrochlorothiazide for ankle edema with improvement.  This was later discontinued due to decline in renal function.  Echo in July 2020 showed EF of 50 to 55%, no wall motion abnormality.  Patient was admitted the end of May 2024 with chest pain found to have mildly elevated troponin.  Cardiac cath showed normal coronaries.  Echo showed LVEF 60 to 65%, grade 1 diastolic dysfunction.  Today, the patient reports she has been doing well. She is wondering about getting back into exercise. She has been resting the last 2 weeks. She has occasional chest and back discomfort. No lower leg edema. Right wrist cath site is stable. She takes THC occasionally, 2-3 time a week.   Past Medical History:  Diagnosis Date   Aortic atherosclerosis (HCC)    Asthma    Bruising    Cardiomyopathy (HCC)    Celiac artery aneurysm (HCC)     Cervical radiculopathy    CKD (chronic kidney disease), stage III (HCC)    Deficiency of vitamin B    Depression    Edema    Flank pain    Gross hematuria    History of 2019 novel coronavirus disease (COVID-19) 05/01/2019   History of bariatric surgery    HLD (hyperlipidemia)    HTN (hypertension)    Interstitial lung disease (HCC)    Iron deficiency anemia    Leukopenia    OA (osteoarthritis)    Obesity    OSA on CPAP    Osteopenia    Perennial allergic rhinitis    Pneumonia due to COVID-19 virus 2020   Renal cysts, acquired, bilateral    RLS (restless legs syndrome)    Sarcoidosis    Sickle cell trait (HCC)    Snoring    Spinal stenosis    Trigeminal neuralgia     Past Surgical History:  Procedure Laterality Date   BARIATRIC SURGERY     CESAREAN SECTION     3 or more   COLONOSCOPY WITH PROPOFOL N/A 03/15/2019   Procedure: COLONOSCOPY WITH PROPOFOL;  Surgeon: Toney Reil, MD;  Location: [facility] ENDOSCOPY;  Service: Gastroenterology;  Laterality: N/A;   COLONOSCOPY WITH PROPOFOL N/A 03/15/2022   Procedure: COLONOSCOPY WITH PROPOFOL;  Surgeon: Toney Reil, MD;  Location: [facility] ENDOSCOPY;  Service: Gastroenterology;  Laterality: N/A;   GASTRIC BYPASS     KNEE ARTHROPLASTY Right 10/18/2020   Procedure: COMPUTER  ASSISTED TOTAL KNEE ARTHROPLASTY;  Surgeon: Donato Heinz, MD;  Location: [facility] ORS;  Service: Orthopedics;  Laterality: Right;   LEFT HEART CATH AND CORONARY ANGIOGRAPHY N/A 01/20/2023   Procedure: LEFT HEART CATH AND CORONARY ANGIOGRAPHY;  Surgeon: Iran Ouch, MD;  Location: [facility] INVASIVE CV LAB;  Service: Cardiovascular;  Laterality: N/A;   SHOULDER ARTHROSCOPY Right    TUBAL LIGATION      Current Medications: Current Meds  Medication Sig   acetaminophen (TYLENOL) 650 MG CR tablet Take 1,300 mg by mouth every 8 (eight) hours as needed for pain.   albuterol (VENTOLIN HFA) 108 (90 Base) MCG/ACT inhaler INHALE 1-2 PUFFS BY MOUTH EVERY 6 HOURS AS  NEEDED FOR WHEEZE OR SHORTNESS OF BREATH   amLODipine (NORVASC) 5 MG tablet Take 5 mg by mouth daily.   Ascorbic Acid (VITAMIN C) 1000 MG tablet Take 1,000 mg by mouth daily.   aspirin EC 81 MG tablet Take 1 tablet by mouth daily.   atorvastatin (LIPITOR) 40 MG tablet TAKE 1 TABLET BY MOUTH EVERY DAY   azelastine (ASTELIN) 0.1 % nasal spray Place 2 sprays into both nostrils 2 (two) times daily. Use in each nostril as directed   budesonide (PULMICORT) 0.5 MG/2ML nebulizer solution Take 2 mLs (0.5 mg total) by nebulization in the morning and at bedtime.   calcium carbonate (TUMS - DOSED IN MG ELEMENTAL CALCIUM) 500 MG chewable tablet Chew 500-1,000 mg by mouth daily as needed for indigestion or heartburn.   ezetimibe (ZETIA) 10 MG tablet TAKE 1 TABLET BY MOUTH EVERY DAY   ferrous sulfate 325 (65 FE) MG tablet Take 325 mg by mouth daily with breakfast.   fluticasone (FLONASE) 50 MCG/ACT nasal spray SPRAY 2 SPRAYS INTO EACH NOSTRIL EVERY DAY   gabapentin (NEURONTIN) 100 MG capsule Take 100-200 mg by mouth 2 (two) times daily. One in am and two at night   ipratropium-albuterol (DUONEB) 0.5-2.5 (3) MG/3ML SOLN Take 3 mLs by nebulization every 6 (six) hours as needed.   levocetirizine (XYZAL ALLERGY 24HR) 5 MG tablet Take 1 tablet (5 mg total) by mouth every evening.   losartan (COZAAR) 50 MG tablet Take 50 mg by mouth daily.   magnesium oxide (MAG-OX) 400 MG tablet Take 400 mg by mouth daily.   methocarbamol (ROBAXIN) 500 MG tablet Take 500 mg by mouth every 8 (eight) hours as needed for muscle spasms.   Multiple Vitamins-Minerals (CENTRUM SILVER) tablet Take 1 tablet by mouth daily.   nortriptyline (PAMELOR) 10 MG capsule Take 10-20 mg by mouth at bedtime.   Probiotic Product (FORTIFY PROBIOTIC WOMENS EX ST PO) Take by mouth daily.   TRELEGY ELLIPTA 200-62.5-25 MCG/ACT AEPB INHALE ONE ACTUATION INTO THE LUNGS DAILY   zinc gluconate 50 MG tablet Take 50 mg by mouth daily.     Allergies:   Shellfish  allergy   Social History   Socioeconomic History   Marital status: Widowed    Spouse name: Orly Pezzino   Number of children: 4   Years of education: Not on file   Highest education level: Some college, no degree  Occupational History    Comment: retired  Tobacco Use   Smoking status: Never   Smokeless tobacco: Never   Tobacco comments:    Second hand smoke exposure  Vaping Use   Vaping Use: Never used  Substance and Sexual Activity   Alcohol use: Not Currently    Alcohol/week: 0.0 standard drinks of alcohol   Drug use: No  Sexual activity: Yes    Partners: Male    Birth control/protection: Post-menopausal  Other Topics Concern   Not on file  Social History Narrative   Pt's son lives with her   Social Determinants of Health   Financial Resource Strain: Low Risk  (10/31/2022)   Overall Financial Resource Strain (CARDIA)    Difficulty of Paying Living Expenses: Not hard at all  Food Insecurity: No Food Insecurity (01/22/2023)   Hunger Vital Sign    Worried About Running Out of Food in the Last Year: Never true    Ran Out of Food in the Last Year: Never true  Transportation Needs: No Transportation Needs (01/22/2023)   PRAPARE - Administrator, Civil Service (Medical): No    Lack of Transportation (Non-Medical): No  Physical Activity: Sufficiently Active (10/31/2022)   Exercise Vital Sign    Days of Exercise per Week: 4 days    Minutes of Exercise per Session: 90 min  Stress: No Stress Concern Present (10/31/2022)   [employer] of Occupational Health - Occupational Stress Questionnaire    Feeling of Stress : Not at all  Social Connections: Socially Isolated (10/31/2022)   Social Connection and Isolation Panel [NHANES]    Frequency of Communication with Friends and Family: More than three times a week    Frequency of Social Gatherings with Friends and Family: More than three times a week    Attends Religious Services: Never    Database administrator or  Organizations: No    Attends Banker Meetings: Never    Marital Status: Widowed     Family History: The patient's family history includes Alcohol abuse in her brother and father; Colon cancer in her sister; Diabetes Mellitus II in her sister; Heart disease in her mother; Hypercholesterolemia in her mother; Hypertension in her maternal grandmother and mother; Lung cancer in her brother. There is no history of Breast cancer, Prostate cancer, Bladder Cancer, or Kidney cancer.  ROS:   Please see the history of present illness.     All other systems reviewed and are negative.  EKGs/Labs/Other Studies Reviewed:    The following studies were reviewed today:  Cardiac cath 01/2023 1.  Normal coronary arteries. 2.  Left ventricular angiography was not performed.  EF was normal by echo. 3.  Normal left ventricular end-diastolic pressure.   Recommendations: No clear culprit is identified for mildly elevated troponin and chest pain.  Suspect tachycardia mediated event.  Echo 01/2023 1. Left ventricular ejection fraction, by estimation, is 60 to 65%. The  left ventricle has normal function. The left ventricle has no regional  wall motion abnormalities. Left ventricular diastolic parameters are  consistent with Grade I diastolic  dysfunction (impaired relaxation).   2. Right ventricular systolic function is normal. The right ventricular  size is normal. There is normal pulmonary artery systolic pressure.   3. The mitral valve is normal in structure. No evidence of mitral valve  regurgitation. No evidence of mitral stenosis.   4. The aortic valve is tricuspid. Aortic valve regurgitation is not  visualized. No aortic stenosis is present.   5. The inferior vena cava is normal in size with greater than 50%  respiratory variability, suggesting right atrial pressure of 3 mmHg.    EKG:  EKG is ordered today.  The ekg ordered today demonstrates NSR 83bpm, TWI aVL  Recent  Labs: 04/10/2022: ALT 24 01/18/2023: BUN 17; Creatinine, Ser 1.43; Hemoglobin 11.6; Platelets 185; Potassium 4.4; Sodium 138;  TSH 4.208  Recent Lipid Panel    Component Value Date/Time   CHOL 134 01/18/2023 0531   CHOL 193 03/27/2020 0956   TRIG 42 01/18/2023 0531   HDL 72 01/18/2023 0531   HDL 88 03/27/2020 0956   CHOLHDL 1.9 01/18/2023 0531   VLDL 8 01/18/2023 0531   LDLCALC 54 01/18/2023 0531   LDLCALC 57 04/10/2022 1014     Physical Exam:    VS:  BP 108/80 (BP Location: Left Arm, Patient Position: Sitting, Cuff Size: Normal)   Pulse 83   Ht 6' (1.829 m)   Wt 213 lb (96.6 kg)   SpO2 97%   BMI 28.89 kg/m     Wt Readings from Last 3 Encounters:  01/31/23 213 lb (96.6 kg)  01/20/23 212 lb 1.3 oz (96.2 kg)  11/19/22 212 lb (96.2 kg)     GEN:  Well nourished, well developed in no acute distress HEENT: Normal NECK: No JVD; No carotid bruits LYMPHATICS: No lymphadenopathy CARDIAC: RRR, no murmurs, rubs, gallops RESPIRATORY:  Clear to auscultation without rales, wheezing or rhonchi  ABDOMEN: Soft, non-tender, non-distended MUSCULOSKELETAL:  No edema; No deformity  SKIN: Warm and dry NEUROLOGIC:  Alert and oriented x 3 PSYCHIATRIC:  Normal affect   ASSESSMENT:    1. Precordial chest pain   2. Non-ST elevation (NSTEMI) myocardial infarction (HCC)   3. Essential hypertension   4. Stage 3a chronic kidney disease (HCC)   5. OSA (obstructive sleep apnea)    PLAN:    In order of problems listed above:  Precordial chest pain/NSTEMI Recent hospitalization with chest pain and mildly elevated troponin. Cardiac cath showed normal coronaries. Echo showed LVEF 60-65%, no WMA, G1DD, normal RVSF. Cath site, right radial, is stable. She reports occasional sharp chest and back pain. She is wondering about getting back into exercise. I recommend she slowly get back into her normal exercise routine. I will also refer her to cardiac rehab. Continue Aspirin, Lipitor and Zetia.  HTN BP  is good today, continue amlodipine and Losartan.   CKD stage 3 Scr baseline around 1.4-1.6  OSA She uses her CPAP nightly.  Disposition: Follow up in 1 month(s) with MD/APP   Signed, Kerron Sedano David Stall, [name]  01/31/2023 8:51 AM    [facility]

## 2023-01-31 ENCOUNTER — Ambulatory Visit: Payer: Medicare Other | Attending: Medical | Admitting: Medical

## 2023-01-31 ENCOUNTER — Encounter: Payer: Self-pay | Admitting: Medical

## 2023-01-31 VITALS — BP 108/80 | HR 83 | Ht 72.0 in | Wt 213.0 lb

## 2023-01-31 DIAGNOSIS — I214 Non-ST elevation (NSTEMI) myocardial infarction: Secondary | ICD-10-CM | POA: Diagnosis not present

## 2023-01-31 DIAGNOSIS — G4733 Obstructive sleep apnea (adult) (pediatric): Secondary | ICD-10-CM

## 2023-01-31 DIAGNOSIS — N1831 Chronic kidney disease, stage 3a: Secondary | ICD-10-CM | POA: Diagnosis not present

## 2023-01-31 DIAGNOSIS — R072 Precordial pain: Secondary | ICD-10-CM

## 2023-01-31 DIAGNOSIS — I10 Essential (primary) hypertension: Secondary | ICD-10-CM | POA: Diagnosis not present

## 2023-01-31 NOTE — Patient Instructions (Signed)
Medication Instructions:  Your physician recommends that you continue on your current medications as directed. Please refer to the Current Medication list given to you today.   *If you need a refill on your cardiac medications before your next appointment, please call your pharmacy*   Lab Work: none If you have labs (blood work) drawn today and your tests are completely normal, you will receive your results only by: MyChart Message (if you have MyChart) OR A paper copy in the mail If you have any lab test that is abnormal or we need to change your treatment, we will call you to review the results.   Testing/Procedures: none   Follow-Up: At [facility], you and your health needs are our priority.  As part of our continuing mission to provide you with exceptional heart care, we have created designated Provider Care Teams.  These Care Teams include your primary Cardiologist (physician) and Advanced Practice Providers (APPs -  Physician Assistants and Nurse Practitioners) who all work together to provide you with the care you need, when you need it.  We recommend signing up for the patient portal called "MyChart".  Sign up information is provided on this After Visit Summary.  MyChart is used to connect with patients for Virtual Visits (Telemedicine).  Patients are able to view lab/test results, encounter notes, upcoming appointments, etc.  Non-urgent messages can be sent to your provider as well.   To learn more about what you can do with MyChart, go to [email or web address].    Your next appointment:   Please keep your appointment on 02/17/2023 at 3:00 PM with Dr. Mariah Milling.  Provider:   Julien Nordmann, MD

## 2023-02-04 ENCOUNTER — Other Ambulatory Visit: Payer: Self-pay | Admitting: *Deleted

## 2023-02-04 DIAGNOSIS — D860 Sarcoidosis of lung: Secondary | ICD-10-CM

## 2023-02-12 DIAGNOSIS — R519 Headache, unspecified: Secondary | ICD-10-CM | POA: Diagnosis not present

## 2023-02-12 DIAGNOSIS — G2581 Restless legs syndrome: Secondary | ICD-10-CM | POA: Diagnosis not present

## 2023-02-12 DIAGNOSIS — Z8669 Personal history of other diseases of the nervous system and sense organs: Secondary | ICD-10-CM | POA: Diagnosis not present

## 2023-02-12 DIAGNOSIS — F411 Generalized anxiety disorder: Secondary | ICD-10-CM | POA: Diagnosis not present

## 2023-02-16 NOTE — Progress Notes (Unsigned)
Date:  02/17/2023   ID:  Melissa [name], DOB [birth year], MRN [id number]  Patient Location:  [address] [place] [address]   Provider location:   [facility],  office  PCP:  Alba Cory, MD  Cardiologist:  Hubbard Robinson [facility]  Chief Complaint  Patient presents with   12 month follow up     Patient saw Cadence Furth, [place] on 01/31/2023 with chest pain, she continues to have dull chest pains that radiates into her back. Medications reviewed by the patient verbally.     History of Present Illness:    Melissa [name] is a 72 y.o. female  past medical history of Atherosclerosis of Aorta seen on CT scan 2018:  hyperlipidemia CRI, likely secondary to polycystic kidney Depression Obesity, Roux-en-Y gastric bypass Back pain Reactive airway disease/asthma/pulmonary sarcoid, bilateral interstitial lung disease Osa on CPAP Who presents for routine follow-up of her aortic atherosclerosis, hyperlipidemia, back and chest pain, sarcoid in lung and musculoskeletal  Last seen by myself in clinic June 2023 Seen by one of our providers June 2024  Reports that she was swimming in the pool at [facility], and the deep end, doing lessons with coach Developed severe shortness of breath, tachycardic Taken to the hospital June 2024 with chest pain, tachycardia rate up to 130 bpm and mildly elevated troponin.  EKG may 31st 2024 sinus tachycardia rate 120 bpm Cardiac cath showed normal coronaries.  Echo showed LVEF 60-65%, no WMA, G1DD, normal RVSF.   In follow-up reports feeling at her baseline, has noticed elevated heart rate at times particularly on exercise day, water aerobics Swimming pool area is hot, humid, chlorine smell possibly irritating lungs  Followed by pulmonary for her lung disease  EKG personally reviewed by myself on todays visit EKG Interpretation Date/Time:  Monday February 17 2023 15:04:00 EDT Ventricular Rate:  103 PR Interval:  132 QRS  Duration:  80 QT Interval:  364 QTC Calculation: 476 R Axis:   -2  Text Interpretation: Sinus tachycardia When compared with ECG of 20-Jan-2023 06:19, No significant change was found Confirmed by Julien Nordmann [phone]) on 02/17/2023 3:32:38 PM    Other past medical history reviewed Reviewed CT chest 03/2020 Minimal aortic atherosclerosis, no significant coronary calcification  CT scan abdomen pelvis  mild aortic atherosclerosis extending into the common iliac arteries And kidney stone noted  Myoview in 10/2015 showed no evidence of significant ischemia, EF 57%, low risk scan.   Echo on 11/24/2015 showed an EF of 35-40%, severe hypokinesis of the mid-apicalanteroseptal and anterior myocardium, Gr1DD, mild TR, PASP normal.   echo on 12/05/2015 to reassess the EF showed an EF of 40-45%, mild diffuse hypokinesis, mild concentric LVH, normal size left atrium, RVSF normal, PASP normal.   Past Medical History:  Diagnosis Date   Aortic atherosclerosis (HCC)    Asthma    Bruising    Cardiomyopathy (HCC)    Celiac artery aneurysm (HCC)    Cervical radiculopathy    CKD (chronic kidney disease), stage III (HCC)    Deficiency of vitamin B    Depression    Edema    Flank pain    Gross hematuria    History of 2019 novel coronavirus disease (COVID-19) 05/01/2019   History of bariatric surgery    HLD (hyperlipidemia)    HTN (hypertension)    Interstitial lung disease (HCC)    Iron deficiency anemia    Leukopenia    OA (osteoarthritis)  Obesity    OSA on CPAP    Osteopenia    Perennial allergic rhinitis    Pneumonia due to COVID-19 virus 2020   Renal cysts, acquired, bilateral    RLS (restless legs syndrome)    Sarcoidosis    Sickle cell trait (HCC)    Snoring    Spinal stenosis    Trigeminal neuralgia    Past Surgical History:  Procedure Laterality Date   BARIATRIC SURGERY     CESAREAN SECTION     3 or more   COLONOSCOPY WITH PROPOFOL N/A 03/15/2019   Procedure: COLONOSCOPY  WITH PROPOFOL;  Surgeon: Toney Reil, MD;  Location: [facility] ENDOSCOPY;  Service: Gastroenterology;  Laterality: N/A;   COLONOSCOPY WITH PROPOFOL N/A 03/15/2022   Procedure: COLONOSCOPY WITH PROPOFOL;  Surgeon: Toney Reil, MD;  Location: [facility] ENDOSCOPY;  Service: Gastroenterology;  Laterality: N/A;   GASTRIC BYPASS     KNEE ARTHROPLASTY Right 10/18/2020   Procedure: COMPUTER ASSISTED TOTAL KNEE ARTHROPLASTY;  Surgeon: Donato Heinz, MD;  Location: [facility] ORS;  Service: Orthopedics;  Laterality: Right;   LEFT HEART CATH AND CORONARY ANGIOGRAPHY N/A 01/20/2023   Procedure: LEFT HEART CATH AND CORONARY ANGIOGRAPHY;  Surgeon: Iran Ouch, MD;  Location: [facility] INVASIVE CV LAB;  Service: Cardiovascular;  Laterality: N/A;   SHOULDER ARTHROSCOPY Right    TUBAL LIGATION       Current Meds  Medication Sig   acetaminophen (TYLENOL) 650 MG CR tablet Take 1,300 mg by mouth every 8 (eight) hours as needed for pain.   albuterol (VENTOLIN HFA) 108 (90 Base) MCG/ACT inhaler INHALE 1-2 PUFFS BY MOUTH EVERY 6 HOURS AS NEEDED FOR WHEEZE OR SHORTNESS OF BREATH   amLODipine (NORVASC) 5 MG tablet Take 5 mg by mouth daily.   Ascorbic Acid (VITAMIN C) 1000 MG tablet Take 1,000 mg by mouth daily.   aspirin EC 81 MG tablet Take 1 tablet by mouth daily.   atorvastatin (LIPITOR) 40 MG tablet TAKE 1 TABLET BY MOUTH EVERY DAY   azelastine (ASTELIN) 0.1 % nasal spray Place 2 sprays into both nostrils 2 (two) times daily. Use in each nostril as directed   budesonide (PULMICORT) 0.5 MG/2ML nebulizer solution Take 2 mLs (0.5 mg total) by nebulization in the morning and at bedtime.   calcium carbonate (TUMS - DOSED IN MG ELEMENTAL CALCIUM) 500 MG chewable tablet Chew 500-1,000 mg by mouth daily as needed for indigestion or heartburn.   Cholecalciferol (D 1000) 25 MCG (1000 UT) capsule Take 1,000 Units by mouth daily.   ezetimibe (ZETIA) 10 MG tablet TAKE 1 TABLET BY MOUTH EVERY DAY   ferrous sulfate 325 (65 FE)  MG tablet Take 325 mg by mouth daily with breakfast.   fluticasone (FLONASE) 50 MCG/ACT nasal spray SPRAY 2 SPRAYS INTO EACH NOSTRIL EVERY DAY   gabapentin (NEURONTIN) 100 MG capsule Take 100-200 mg by mouth 2 (two) times daily. One in am and two at night   ipratropium-albuterol (DUONEB) 0.5-2.5 (3) MG/3ML SOLN Take 3 mLs by nebulization every 6 (six) hours as needed.   levocetirizine (XYZAL ALLERGY 24HR) 5 MG tablet Take 1 tablet (5 mg total) by mouth every evening.   losartan (COZAAR) 50 MG tablet Take 50 mg by mouth daily.   magnesium oxide (MAG-OX) 400 MG tablet Take 400 mg by mouth daily.   methocarbamol (ROBAXIN) 500 MG tablet Take 500 mg by mouth every 8 (eight) hours as needed for muscle spasms.   Multiple Vitamins-Minerals (CENTRUM SILVER) tablet Take  1 tablet by mouth daily.   nortriptyline (PAMELOR) 10 MG capsule Take 10-20 mg by mouth at bedtime.   Probiotic Product (FORTIFY PROBIOTIC WOMENS EX ST PO) Take by mouth daily.   TRELEGY ELLIPTA 200-62.5-25 MCG/ACT AEPB INHALE ONE ACTUATION INTO THE LUNGS DAILY   zinc gluconate 50 MG tablet Take 50 mg by mouth daily.     Allergies:   Shellfish allergy   Social History   Tobacco Use   Smoking status: Never   Smokeless tobacco: Never   Tobacco comments:    Second hand smoke exposure  Vaping Use   Vaping Use: Never used  Substance Use Topics   Alcohol use: Not Currently    Alcohol/week: 0.0 standard drinks of alcohol   Drug use: No     Family Hx: The patient's family history includes Alcohol abuse in her brother and father; Colon cancer in her sister; Diabetes Mellitus II in her sister; Heart disease in her mother; Hypercholesterolemia in her mother; Hypertension in her maternal grandmother and mother; Lung cancer in her brother. There is no history of Breast cancer, Prostate cancer, Bladder Cancer, or Kidney cancer.  ROS:   Please see the history of present illness.    Review of Systems  Constitutional: Negative.   HENT:  Negative.    Respiratory: Negative.    Cardiovascular: Negative.   Gastrointestinal: Negative.   Musculoskeletal: Negative.   Neurological: Negative.   Psychiatric/Behavioral: Negative.    All other systems reviewed and are negative.    Labs/Other Tests and Data Reviewed:    Recent Labs: 04/10/2022: ALT 24 01/18/2023: BUN 17; Creatinine, Ser 1.43; Hemoglobin 11.6; Platelets 185; Potassium 4.4; Sodium 138; TSH 4.208   Recent Lipid Panel Lab Results  Component Value Date/Time   CHOL 134 01/18/2023 05:31 AM   CHOL 193 03/27/2020 09:56 AM   TRIG 42 01/18/2023 05:31 AM   HDL 72 01/18/2023 05:31 AM   HDL 88 03/27/2020 09:56 AM   CHOLHDL 1.9 01/18/2023 05:31 AM   LDLCALC 54 01/18/2023 05:31 AM   LDLCALC 57 04/10/2022 10:14 AM    Wt Readings from Last 3 Encounters:  02/17/23 209 lb 4 oz (94.9 kg)  01/31/23 213 lb (96.6 kg)  01/20/23 212 lb 1.3 oz (96.2 kg)     Exam:   BP 108/72 (BP Location: Left Arm, Patient Position: Sitting, Cuff Size: Normal)   Pulse (!) 103   Ht 6' (1.829 m)   Wt 209 lb 4 oz (94.9 kg)   SpO2 98%   BMI 28.38 kg/m  Constitutional:  oriented to person, place, and time. No distress.  HENT:  Head: Grossly normal Eyes:  no discharge. No scleral icterus.  Neck: No JVD, no carotid bruits  Cardiovascular: Regular rate and rhythm, no murmurs appreciated Pulmonary/Chest: Clear to auscultation bilaterally, no wheezes or rails Abdominal: Soft.  no distension.  no tenderness.  Musculoskeletal: Normal range of motion Neurological:  normal muscle tone. Coordination normal. No atrophy Skin: Skin warm and dry Psychiatric: normal affect, pleasant  ASSESSMENT & PLAN:    Problem List Items Addressed This Visit   None Visit Diagnoses     Precordial chest pain    -  Primary   Relevant Orders   EKG 12-Lead (Completed)   Essential hypertension       Relevant Orders   EKG 12-Lead (Completed)   OSA (obstructive sleep apnea)       Non-ST elevation (NSTEMI)  myocardial infarction [facility])       Relevant Orders  EKG 12-Lead (Completed)     Leg edema  Minimal swelling on today's visit, no change  Anemia Stable hemoglobin 11.6 In the setting of chronic kidney disease creatinine 1.4 GFR 40  Aortic atherosclerosis disease on CT scan On Lipitor with Zetia  Hyperlipidemia Cholesterol much improved 134  Sarcoid pulmonary/musculoskeletal No indication of cardiac sarcoid Normal echocardiogram, normal EKG, no new symptoms By pulmonary for her respiratory disease Pulmonary rehab ordered Shortness of breath on exercise with associated tachycardia rate up to 130 on recent hospital admission swimming in the pool  Sinus tachycardia Recommend she take propranolol 10 mg with repeat if needed for prolonged tachycardia that does not improve with sitting resting, sitting in a cool environment Symptoms possibly exacerbated in the setting of warm, moist, chlorine area of the pool  Back and chest pain Recent cardiac workup in the hospital with catheterization showing no obstructive coronary disease, possibly secondary to underlying lung disease  Chronic renal insufficiency Stage IIIb, GFR 40 creatinine 1.43, stable if not improved   Total encounter time more than 40 minutes  Greater than 50% was spent in counseling and coordination of care with the patient  Signed, Julien Nordmann, MD  02/17/2023 3:29 PM    [facility] [address], [place], [place] [address]

## 2023-02-17 ENCOUNTER — Ambulatory Visit: Payer: Medicare Other | Attending: Cardiovascular Disease | Admitting: Cardiovascular Disease

## 2023-02-17 ENCOUNTER — Encounter: Payer: Self-pay | Admitting: Cardiovascular Disease

## 2023-02-17 VITALS — BP 108/72 | HR 103 | Ht 72.0 in | Wt 209.2 lb

## 2023-02-17 DIAGNOSIS — G4733 Obstructive sleep apnea (adult) (pediatric): Secondary | ICD-10-CM | POA: Diagnosis not present

## 2023-02-17 DIAGNOSIS — I214 Non-ST elevation (NSTEMI) myocardial infarction: Secondary | ICD-10-CM | POA: Insufficient documentation

## 2023-02-17 DIAGNOSIS — I10 Essential (primary) hypertension: Secondary | ICD-10-CM | POA: Diagnosis not present

## 2023-02-17 DIAGNOSIS — J45909 Unspecified asthma, uncomplicated: Secondary | ICD-10-CM | POA: Insufficient documentation

## 2023-02-17 DIAGNOSIS — R072 Precordial pain: Secondary | ICD-10-CM | POA: Diagnosis not present

## 2023-02-17 MED ORDER — PROPRANOLOL HCL 10 MG PO TABS: 10.0000 mg | ORAL_TABLET | Freq: Three times a day (TID) | ORAL | 3 refills | Status: AC | PRN

## 2023-02-17 NOTE — Patient Instructions (Addendum)
Pulmonary rehab  Reactive airway disease/asthma/pulmonary sarcoid, bilateral interstitial lung disease   Medication Instructions:  Propranolol 10 mg as needed for sustained fast heart rate   If you need a refill on your cardiac medications before your next appointment, please call your pharmacy.   Lab work: No new labs needed  Testing/Procedures: No new testing needed  Follow-Up: At [facility], you and your health needs are our priority.  As part of our continuing mission to provide you with exceptional heart care, we have created designated Provider Care Teams.  These Care Teams include your primary Cardiologist (physician) and Advanced Practice Providers (APPs -  Physician Assistants and Nurse Practitioners) who all work together to provide you with the care you need, when you need it.  You will need a follow up appointment in 12 months  Providers on your designated Care Team:   Nicolasa Ducking, NP Eula Listen, [name] Cadence Fransico Michael, [place]  COVID-19 Vaccine Information can be found at: [email or web address] For questions related to vaccine distribution or appointments, please email [email or web address] or call [phone].

## 2023-02-26 DIAGNOSIS — F411 Generalized anxiety disorder: Secondary | ICD-10-CM | POA: Diagnosis not present

## 2023-03-14 ENCOUNTER — Encounter: Payer: Self-pay | Admitting: Family Medicine

## 2023-03-14 ENCOUNTER — Ambulatory Visit: Payer: Self-pay | Admitting: *Deleted

## 2023-03-14 ENCOUNTER — Telehealth: Payer: Medicare Other | Admitting: Family Medicine

## 2023-03-14 VITALS — Ht 72.0 in | Wt 209.0 lb

## 2023-03-14 DIAGNOSIS — J42 Unspecified chronic bronchitis: Secondary | ICD-10-CM

## 2023-03-14 DIAGNOSIS — J069 Acute upper respiratory infection, unspecified: Secondary | ICD-10-CM | POA: Diagnosis not present

## 2023-03-14 DIAGNOSIS — D860 Sarcoidosis of lung: Secondary | ICD-10-CM

## 2023-03-14 DIAGNOSIS — N1832 Chronic kidney disease, stage 3b: Secondary | ICD-10-CM

## 2023-03-14 DIAGNOSIS — J4521 Mild intermittent asthma with (acute) exacerbation: Secondary | ICD-10-CM | POA: Diagnosis not present

## 2023-03-14 DIAGNOSIS — U071 COVID-19: Secondary | ICD-10-CM

## 2023-03-14 MED ORDER — NIRMATRELVIR/RITONAVIR (PAXLOVID) TABLET (RENAL DOSING)
2.0000 | ORAL_TABLET | Freq: Two times a day (BID) | ORAL | 0 refills | Status: AC
Start: 2023-03-14 — End: 2023-03-19

## 2023-03-14 NOTE — Progress Notes (Signed)
Name: Melissa [name]   MRN: [id number]    DOB: 1951-03-22   Date:03/14/2023       Progress Note  Subjective:    Chief Complaint  Chief Complaint  Patient presents with   Covid Positive   Cough    Productive- started Wednesday    I connected with  [name]  on 03/14/23 at  3:20 PM EDT by a video enabled telemedicine application and verified that I am speaking with the correct person using two identifiers.  I discussed the limitations of evaluation and management by telemedicine and the availability of in person appointments. The patient expressed understanding and agreed to proceed. Staff also discussed with the patient that there may be a patient responsible charge related to this service. Patient Location: home Provider Location: [facility]  Additional Individuals present: none  Cough   Pt presents with mild cough productive Wednesday  She also notes lips were dry and the day before she has mild throat discomfort. Last night covid test was positive    Patient Active Problem List   Diagnosis Date Noted   Elevated troponin 01/18/2023   Chest pain 01/17/2023   HTN (hypertension) 01/17/2023   HLD (hyperlipidemia) 01/17/2023   Depression 01/17/2023   Asthma 01/17/2023   Unstable angina (HCC) 01/17/2023   Polyp of transverse colon    Hematuria 01/03/2021   Total knee replacement status 10/18/2020   Primary osteoarthritis of right hip 08/06/2020   Sarcoidosis of lung (HCC) 05/29/2020   Aneurysm of celiac artery (HCC) 02/11/2020   Localized swelling of right lower leg 09/06/2019   OSA on CPAP 09/06/2019   Secondary hyperparathyroidism of renal origin (HCC) 07/21/2019   Anemia in chronic kidney disease 06/14/2019   Benign hypertensive kidney disease with chronic kidney disease 06/14/2019   Sickle cell trait (HCC) 06/14/2019   Stage 3b chronic kidney disease (HCC) 06/14/2019   Chronic bronchitis (HCC) 05/19/2019   Chronic venous insufficiency 04/15/2019    Lymphedema 04/15/2019   Family history of colon cancer    Trigeminal neuralgia 09/11/2018   Cardiac LV ejection fraction of 40-49% 05/07/2017   Low back pain without sciatica 02/21/2017   Renal cyst 10/18/2016   Atherosclerosis of aorta (HCC) 10/16/2016   History of colonic polyps 09/25/2016   Dyslipidemia 12/08/2015   Gastroesophageal reflux disease without esophagitis 08/11/2015   Primary osteoarthritis of knee 06/13/2015   Hip bursitis 06/13/2015   RLS (restless legs syndrome) 06/13/2015   Depression, major, in remission (HCC) 06/13/2015   B12 deficiency 06/13/2015   History of Roux-en-Y gastric bypass 06/13/2015   History of iron deficiency anemia 06/13/2015   Vitamin D deficiency 06/13/2015   Hypertension, benign 06/13/2015   Perennial allergic rhinitis with seasonal variation 06/13/2015   Primary osteoarthritis of both knees 06/13/2015   Cervical radiculitis 01/04/2014   Cervical spinal stenosis 01/04/2014   Cervical osteoarthritis 01/04/2014    Social History   Tobacco Use   Smoking status: Never   Smokeless tobacco: Never   Tobacco comments:    Second hand smoke exposure  Substance Use Topics   Alcohol use: Not Currently    Alcohol/week: 0.0 standard drinks of alcohol     Current Outpatient Medications:    acetaminophen (TYLENOL) 650 MG CR tablet, Take 1,300 mg by mouth every 8 (eight) hours as needed for pain., Disp: , Rfl:    albuterol (VENTOLIN HFA) 108 (90 Base) MCG/ACT inhaler, INHALE 1-2 PUFFS BY MOUTH EVERY 6 HOURS AS NEEDED FOR WHEEZE OR SHORTNESS  OF BREATH, Disp: 18 each, Rfl: 0   amLODipine (NORVASC) 5 MG tablet, Take 5 mg by mouth daily., Disp: , Rfl:    Ascorbic Acid (VITAMIN C) 1000 MG tablet, Take 1,000 mg by mouth daily., Disp: , Rfl:    aspirin EC 81 MG tablet, Take 1 tablet by mouth daily., Disp: , Rfl:    atorvastatin (LIPITOR) 40 MG tablet, TAKE 1 TABLET BY MOUTH EVERY DAY, Disp: 90 tablet, Rfl: 1   azelastine (ASTELIN) 0.1 % nasal spray, Place  2 sprays into both nostrils 2 (two) times daily. Use in each nostril as directed, Disp: 30 mL, Rfl: 12   budesonide (PULMICORT) 0.5 MG/2ML nebulizer solution, Take 2 mLs (0.5 mg total) by nebulization in the morning and at bedtime., Disp: 120 mL, Rfl: 6   calcium carbonate (TUMS - DOSED IN MG ELEMENTAL CALCIUM) 500 MG chewable tablet, Chew 500-1,000 mg by mouth daily as needed for indigestion or heartburn., Disp: , Rfl:    Cholecalciferol (D 1000) 25 MCG (1000 UT) capsule, Take 1,000 Units by mouth daily., Disp: , Rfl:    ezetimibe (ZETIA) 10 MG tablet, TAKE 1 TABLET BY MOUTH EVERY DAY, Disp: 90 tablet, Rfl: 0   ferrous sulfate 325 (65 FE) MG tablet, Take 325 mg by mouth daily with breakfast., Disp: , Rfl:    fluticasone (FLONASE) 50 MCG/ACT nasal spray, SPRAY 2 SPRAYS INTO EACH NOSTRIL EVERY DAY, Disp: 48 mL, Rfl: 1   gabapentin (NEURONTIN) 100 MG capsule, Take 100-200 mg by mouth 2 (two) times daily. One in am and two at night, Disp: , Rfl:    ipratropium-albuterol (DUONEB) 0.5-2.5 (3) MG/3ML SOLN, Take 3 mLs by nebulization every 6 (six) hours as needed., Disp: 360 mL, Rfl: 3   levocetirizine (XYZAL ALLERGY 24HR) 5 MG tablet, Take 1 tablet (5 mg total) by mouth every evening., Disp: 30 tablet, Rfl: 6   losartan (COZAAR) 50 MG tablet, Take 50 mg by mouth daily., Disp: , Rfl:    magnesium oxide (MAG-OX) 400 MG tablet, Take 400 mg by mouth daily., Disp: , Rfl:    methocarbamol (ROBAXIN) 500 MG tablet, Take 500 mg by mouth every 8 (eight) hours as needed for muscle spasms., Disp: , Rfl:    Multiple Vitamins-Minerals (CENTRUM SILVER) tablet, Take 1 tablet by mouth daily., Disp: 30 tablet, Rfl: 0   nortriptyline (PAMELOR) 10 MG capsule, Take 10-20 mg by mouth at bedtime., Disp: , Rfl:    Probiotic Product (FORTIFY PROBIOTIC WOMENS EX ST PO), Take by mouth daily., Disp: , Rfl:    propranolol (INDERAL) 10 MG tablet, Take 1 tablet (10 mg total) by mouth 3 (three) times daily as needed. For sustained fast  heart rate, Disp: 60 tablet, Rfl: 3   TRELEGY ELLIPTA 200-62.5-25 MCG/ACT AEPB, INHALE ONE ACTUATION INTO THE LUNGS DAILY, Disp: 60 each, Rfl: 5   zinc gluconate 50 MG tablet, Take 50 mg by mouth daily., Disp: , Rfl:   Allergies  Allergen Reactions   Shellfish Allergy     Edema    I personally reviewed active problem list, medication list, allergies, family history, social history, health maintenance, notes from last encounter, lab results, imaging with the patient/caregiver today.   Review of Systems  Constitutional: Negative.   HENT: Negative.    Eyes: Negative.   Respiratory:  Positive for cough.   Cardiovascular: Negative.   Gastrointestinal: Negative.   Endocrine: Negative.   Genitourinary: Negative.   Musculoskeletal: Negative.   Skin: Negative.   Allergic/Immunologic: Negative.  Neurological: Negative.   Hematological: Negative.   Psychiatric/Behavioral: Negative.    All other systems reviewed and are negative.     Objective:   Virtual encounter, vitals limited, only able to obtain the following Today's Vitals   03/14/23 1339  Weight: 209 lb (94.8 kg)  Height: 6' (1.829 m)   Body mass index is 28.35 kg/m. Nursing Note and Vital Signs reviewed.  Physical Exam Vitals and nursing note reviewed.  Constitutional:      General: She is not in acute distress.    Appearance: Normal appearance. She is not ill-appearing, toxic-appearing or diaphoretic.     Interventions: She is not intubated. Pulmonary:     Effort: No tachypnea, accessory muscle usage, respiratory distress or retractions. She is not intubated.     Comments: Able to speak in full and complete sentences Neurological:     Mental Status: She is alert.     PE limited by virtual encounter  No results found for this or any previous visit (from the past 72 hour(s)).  Assessment and Plan:     ICD-10-CM   1. Upper respiratory tract infection due to COVID-19 virus  U07.1 nirmatrelvir/ritonavir,  renal dosing, (PAXLOVID) 10 x 150 MG & 10 x 100MG  TABS   J06.9    very mild sx, onset 2-3 d ago, improving today, positive test last night, pt high risk but wishes to hold on paxlovid for now    2. Chronic kidney disease, stage 3b (HCC)  N18.32    renal dosing for meds - reviewed labs    3. Mild intermittent asthma with acute exacerbation  J45.21    watch for any worsening - nebs at home available, not using, no CP/wheeze/sob    4. Sarcoidosis of lung (HCC)  D86.0     5. Chronic bronchitis, unspecified chronic bronchitis type (HCC)  J42    not currently needing nebs, and she is not needing steroids, encouraged pt to watch for any worsening sx - close f/up recommended     Hx of bronchitis, her sx are already getting better today and she hasn't had to use her nebs yet, she did check and had them available at [facility], she does want paxlovid sent into the pharmacy (renal dosing with eGFR 40/CKD stage 3b) but if she continues to feel better she may not take it Reviewed window for paxlovid with pt Reviewed insolation/masking with mild COVID illness and no fever   -Red flags and when to present for emergency care or RTC including fever >101.66F, chest pain, shortness of breath, new/worsening/un-resolving symptoms, reviewed with patient at time of visit. Follow up and care instructions discussed and provided in AVS. - I discussed the assessment and treatment plan with the patient. The patient was provided an opportunity to ask questions and all were answered. The patient agreed with the plan and demonstrated an understanding of the instructions.  I provided 15 minutes of non-face-to-face time during this encounter.  Danelle Berry, [name] 03/14/23 2:23 PM

## 2023-03-14 NOTE — Telephone Encounter (Signed)
Summary: Covid positive, seeking advice   Pt is covid positive, is experiencing a cough. Had a sore throat but that has improved. She does have asthma.  Best contact: [phone]       Chief Complaint: positive covid at home test  Symptoms: cough productive clear to white . Coughing causes pain in chest. Nasal congestion able to breath through mouth without difficulty . Hx asthma, pulmonary sarcoidosis, HTN Frequency: Tuesday 03/11/23 Pertinent Negatives: Patient denies chest pain with sweating no difficulty breathing, no fever no dizziness. Disposition: [] ED /[] Urgent Care (no appt availability in office) / [x] Appointment(In office/virtual)/ []  [facility]/ [] Home Care/ [] Refused Recommended Disposition /[] [facility]/ []  Follow-up with PCP Additional Notes:  No appt with PCP today . Scheduled My chart VV with L. Angelica Chessman, PA for today.  Patient would like to know cough med OTC safe for HTN      Reason for Disposition  [1] HIGH RISK patient (e.g., weak immune system, age > 64 years, obesity with BMI 30 or higher, pregnant, chronic lung disease or other chronic medical condition) AND [2] COVID symptoms (e.g., cough, fever)  (Exceptions: Already seen by PCP and no new or worsening symptoms.)  Answer Assessment - Initial Assessment Questions 1. COVID-19 DIAGNOSIS: "How do you know that you have COVID?" (e.g., positive lab test or self-test, diagnosed by doctor or NP/PA, symptoms after exposure).     Covid positive test at home today  2. COVID-19 EXPOSURE: "Was there any known exposure to COVID before the symptoms began?" CDC Definition of close contact: within 6 feet (2 meters) for a total of 15 minutes or more over a 24-hour period.      Was on vacation when sx started 3. ONSET: "When did the COVID-19 symptoms start?"      Tuesday 03/11/23 4. WORST SYMPTOM: "What is your worst symptom?" (e.g., cough, fever, shortness of breath, muscle aches)     Cough  5. COUGH: "Do  you have a cough?" If Yes, ask: "How bad is the cough?"       Yes  6. FEVER: "Do you have a fever?" If Yes, ask: "What is your temperature, how was it measured, and when did it start?"     Feels warm now but has not checked temp 7. RESPIRATORY STATUS: "Describe your breathing?" (e.g., normal; shortness of breath, wheezing, unable to speak)      No c/o at this time does have hx pulmonary sarcodosis 8. BETTER-SAME-WORSE: "Are you getting better, staying the same or getting worse compared to yesterday?"  If getting worse, ask, "In what way?"     Getting better but still has cough  9. OTHER SYMPTOMS: "Do you have any other symptoms?"  (e.g., chills, fatigue, headache, loss of smell or taste, muscle pain, sore throat)     Cough, productive, clear white , nasal congestion 10. HIGH RISK DISEASE: "Do you have any chronic medical problems?" (e.g., asthma, heart or lung disease, weak immune system, obesity, etc.)       Hx asthma  11. VACCINE: "Have you had the COVID-19 vaccine?" If Yes, ask: "Which one, how many shots, when did you get it?"       Yes  12. PREGNANCY: "Is there any chance you are pregnant?" "When was your last menstrual period?"       na 13. O2 SATURATION MONITOR:  "Do you use an oxygen saturation monitor (pulse oximeter) at home?" If Yes, ask "What is your reading (oxygen level) today?" "What is  your usual oxygen saturation reading?" (e.g., 95%)       na  Protocols used: Coronavirus (COVID-19) Diagnosed or Suspected-A-AH

## 2023-03-24 NOTE — Progress Notes (Unsigned)
Name: Melissa [name]   MRN: [id number]    DOB: 09-05-1950   Date:03/25/2023       Progress Note  Subjective  Chief Complaint  Follow up  HPI  Atherosclerosis aorta and CAD/Celiac artery aneurysm : found on CT chest done 2021. She has been on statin therapy and aspirin, sees cardiologist , Dr. Mariah Milling yearly. Last CT renal stone was 06/22 and size of celiac artery aneurysm 15 mm and stable, monitor every 2 -3 years. Repeat in 2024 and size is unchanged but we will refer her to vascular surgeon    OSA : she sees pulmonologist, wears CPAP every night., under the care of Dr. Belia Heman    Sarcoidosis/Interstitial lung disease/OSA and chronic bronchitis:  currently on Trelegy  and albuterol prn, and back on Dr. Belia Heman   . She has been evaluated by Rheumatologist and negative for sarcoidosis outside of lung. She had COVID recently and had a cough but is improving now without change in therapy   IMPRESSION: 04/15/2022  1. Similar appearance compared to the prior CT's. 2. There are 2 small nodules in the left upper lobe that have mildly increased in size, but are still most likely inflammatory in etiology. However, follow-up is recommended ensure stability. Recommend follow-up unenhanced chest CT in 6-12 months. 3. Prominent and mildly enlarged mediastinal and hilar lymph nodes with associated dystrophic calcifications. Lung findings as detailed above, with the overall combination of findings consistent with the provided history of sarcoidosis.   CKI stage III: history of secondary hyperparathyroidism, under the care of nephrologist, he had adjusted her dose of losartan to 50 mg and also given added norvasc 5 mg, however she has not been taking norvasc for over 2 weeks and bp is low at 106/72 and heart rate is 104, advised to stay off norvasc and take half dose of losartan 25 mg and monitor if bp spikes can take prn propanolol given by Dr. Mariah Milling . At the time of nephrologist visit she was really worried  due to family issues    Sickle cell trait: asymptomatic.  Unchanged    Depression Major Depression: she took medications for a while, it was present for a few years, she is in remission.  She still sees her therapist every two weeks. Her children are involved, goes to water aerobics and seems to be doing well  Angina: went to [facility] in June after an episode of chest pain that was right after her water aerobic class. Cardiac cath negative but troponin spiked a little. Seen by cardiologist since    Cervical radiculitis: she used to get injections by Dr. Council Mechanic, she states lately noticing pain on right side of neck that radiates to right upper arm. She denies weakness. Unchanged   Muscles spasms: she states symptoms seems to be getting worse, toes , feet and has to get up to walk, it also affects her hands and abdominal wall. She takes skelaxin prn and is now on gabapentin . She has an appointment with Dr. Malvin Johns. She states liquid IV seems to help.   Lower abdominal pain : going on for weeks, lower abdominal area, no change in bowel movements or blood in stools, she would like to go back to Dr. Servando Snare   Patient Active Problem List   Diagnosis Date Noted   Interstitial lung disease due to granulomatous disease (HCC) 03/25/2023   Cardiomyopathy, unspecified type (HCC) 03/25/2023   HTN (hypertension) 01/17/2023   HLD (hyperlipidemia) 01/17/2023   Asthma 01/17/2023  Unstable angina (HCC) 01/17/2023   Polyp of transverse colon    Total knee replacement status 10/18/2020   Primary osteoarthritis of right hip 08/06/2020   Sarcoidosis of lung (HCC) 05/29/2020   Aneurysm of celiac artery (HCC) 02/11/2020   Localized swelling of right lower leg 09/06/2019   OSA on CPAP 09/06/2019   Secondary hyperparathyroidism of renal origin (HCC) 07/21/2019   Anemia in chronic kidney disease 06/14/2019   Benign hypertensive kidney disease with chronic kidney disease 06/14/2019   Sickle cell trait (HCC)  06/14/2019   Stage 3b chronic kidney disease (HCC) 06/14/2019   Chronic bronchitis (HCC) 05/19/2019   Chronic venous insufficiency 04/15/2019   Lymphedema 04/15/2019   Family history of colon cancer    Trigeminal neuralgia 09/11/2018   Low back pain without sciatica 02/21/2017   Renal cyst 10/18/2016   Atherosclerosis of aorta (HCC) 10/16/2016   History of colonic polyps 09/25/2016   Dyslipidemia 12/08/2015   Gastroesophageal reflux disease without esophagitis 08/11/2015   Primary osteoarthritis of knee 06/13/2015   Hip bursitis 06/13/2015   RLS (restless legs syndrome) 06/13/2015   Depression, major, in remission (HCC) 06/13/2015   B12 deficiency 06/13/2015   History of Roux-en-Y gastric bypass 06/13/2015   History of iron deficiency anemia 06/13/2015   Vitamin D deficiency 06/13/2015   Hypertension, benign 06/13/2015   Perennial allergic rhinitis with seasonal variation 06/13/2015   Primary osteoarthritis of both knees 06/13/2015   Cervical radiculitis 01/04/2014   Cervical spinal stenosis 01/04/2014   Cervical osteoarthritis 01/04/2014    Past Surgical History:  Procedure Laterality Date   BARIATRIC SURGERY     CESAREAN SECTION     3 or more   COLONOSCOPY WITH PROPOFOL N/A 03/15/2019   Procedure: COLONOSCOPY WITH PROPOFOL;  Surgeon: Toney Reil, MD;  Location: [facility] ENDOSCOPY;  Service: Gastroenterology;  Laterality: N/A;   COLONOSCOPY WITH PROPOFOL N/A 03/15/2022   Procedure: COLONOSCOPY WITH PROPOFOL;  Surgeon: Toney Reil, MD;  Location: [facility] ENDOSCOPY;  Service: Gastroenterology;  Laterality: N/A;   GASTRIC BYPASS     KNEE ARTHROPLASTY Right 10/18/2020   Procedure: COMPUTER ASSISTED TOTAL KNEE ARTHROPLASTY;  Surgeon: Donato Heinz, MD;  Location: [facility] ORS;  Service: Orthopedics;  Laterality: Right;   LEFT HEART CATH AND CORONARY ANGIOGRAPHY N/A 01/20/2023   Procedure: LEFT HEART CATH AND CORONARY ANGIOGRAPHY;  Surgeon: Iran Ouch, MD;  Location: [facility]  INVASIVE CV LAB;  Service: Cardiovascular;  Laterality: N/A;   SHOULDER ARTHROSCOPY Right    TUBAL LIGATION      Family History  Problem Relation Age of Onset   Hypercholesterolemia Mother    Heart disease Mother    Hypertension Mother    Alcohol abuse Father    Lung cancer Brother    Alcohol abuse Brother    Diabetes Mellitus II Sister    Hypertension Maternal Grandmother    Colon cancer Sister    Breast cancer Neg Hx    Prostate cancer Neg Hx    Bladder Cancer Neg Hx    Kidney cancer Neg Hx     Social History   Tobacco Use   Smoking status: Never   Smokeless tobacco: Never   Tobacco comments:    Second hand smoke exposure  Substance Use Topics   Alcohol use: Not Currently    Alcohol/week: 0.0 standard drinks of alcohol     Current Outpatient Medications:    acetaminophen (TYLENOL) 650 MG CR tablet, Take 1,300 mg by mouth every 8 (eight) hours as needed  for pain., Disp: , Rfl:    albuterol (VENTOLIN HFA) 108 (90 Base) MCG/ACT inhaler, INHALE 1-2 PUFFS BY MOUTH EVERY 6 HOURS AS NEEDED FOR WHEEZE OR SHORTNESS OF BREATH, Disp: 18 each, Rfl: 0   Ascorbic Acid (VITAMIN C) 1000 MG tablet, Take 1,000 mg by mouth daily., Disp: , Rfl:    aspirin EC 81 MG tablet, Take 1 tablet by mouth daily., Disp: , Rfl:    azelastine (ASTELIN) 0.1 % nasal spray, Place 2 sprays into both nostrils 2 (two) times daily. Use in each nostril as directed, Disp: 30 mL, Rfl: 12   budesonide (PULMICORT) 0.5 MG/2ML nebulizer solution, Take 2 mLs (0.5 mg total) by nebulization in the morning and at bedtime., Disp: 120 mL, Rfl: 6   calcium carbonate (TUMS - DOSED IN MG ELEMENTAL CALCIUM) 500 MG chewable tablet, Chew 500-1,000 mg by mouth daily as needed for indigestion or heartburn., Disp: , Rfl:    Cholecalciferol (D 1000) 25 MCG (1000 UT) capsule, Take 1,000 Units by mouth daily., Disp: , Rfl:    ferrous sulfate 325 (65 FE) MG tablet, Take 325 mg by mouth daily with breakfast., Disp: , Rfl:    fluticasone  (FLONASE) 50 MCG/ACT nasal spray, SPRAY 2 SPRAYS INTO EACH NOSTRIL EVERY DAY, Disp: 48 mL, Rfl: 1   gabapentin (NEURONTIN) 100 MG capsule, Take 100-200 mg by mouth 2 (two) times daily. One in am and two at night, Disp: , Rfl:    ipratropium-albuterol (DUONEB) 0.5-2.5 (3) MG/3ML SOLN, Take 3 mLs by nebulization every 6 (six) hours as needed., Disp: 360 mL, Rfl: 3   levocetirizine (XYZAL ALLERGY 24HR) 5 MG tablet, Take 1 tablet (5 mg total) by mouth every evening., Disp: 30 tablet, Rfl: 6   magnesium oxide (MAG-OX) 400 MG tablet, Take 400 mg by mouth daily., Disp: , Rfl:    methocarbamol (ROBAXIN) 500 MG tablet, Take 500 mg by mouth every 8 (eight) hours as needed for muscle spasms., Disp: , Rfl:    Multiple Vitamins-Minerals (CENTRUM SILVER) tablet, Take 1 tablet by mouth daily., Disp: 30 tablet, Rfl: 0   nortriptyline (PAMELOR) 10 MG capsule, Take 10-20 mg by mouth at bedtime., Disp: , Rfl:    Probiotic Product (FORTIFY PROBIOTIC WOMENS EX ST PO), Take by mouth daily., Disp: , Rfl:    propranolol (INDERAL) 10 MG tablet, Take 1 tablet (10 mg total) by mouth 3 (three) times daily as needed. For sustained fast heart rate, Disp: 60 tablet, Rfl: 3   TRELEGY ELLIPTA 200-62.5-25 MCG/ACT AEPB, INHALE ONE ACTUATION INTO THE LUNGS DAILY, Disp: 60 each, Rfl: 5   zinc gluconate 50 MG tablet, Take 50 mg by mouth daily., Disp: , Rfl:    atorvastatin (LIPITOR) 40 MG tablet, Take 1 tablet (40 mg total) by mouth daily., Disp: 90 tablet, Rfl: 1   ezetimibe (ZETIA) 10 MG tablet, Take 1 tablet (10 mg total) by mouth daily., Disp: 90 tablet, Rfl: 1   losartan (COZAAR) 25 MG tablet, Take 1 tablet (25 mg total) by mouth daily., Disp: 90 tablet, Rfl: 0  Allergies  Allergen Reactions   Shellfish Allergy     Edema    I personally reviewed active problem list, medication list, allergies with the patient/caregiver today.   ROS  Ten systems reviewed and is negative except as mentioned in HPI    Objective  Vitals:    03/25/23 0947  BP: 106/72  Pulse: (!) 104  Resp: 14  Temp: (!) 97.4 F (36.3 C)  TempSrc:  Oral  SpO2: 98%  Weight: 202 lb 4.8 oz (91.8 kg)  Height: 6' (1.829 m)   Body mass index is 27.44 kg/m.  Physical Exam  Constitutional: Patient appears well-developed and well-nourished.  No distress.  HEENT: head atraumatic, normocephalic, pupils equal and reactive to light, neck supple Cardiovascular: Normal rate, regular rhythm and normal heart sounds.  No murmur heard. No BLE edema. Pulmonary/Chest: Effort normal and breath sounds normal. No respiratory distress. Abdominal: Soft.  There is no tenderness. Psychiatric: Patient has a normal mood and affect. behavior is normal. Judgment and thought content normal.   Recent Results (from the past 2160 hour(s))  Basic metabolic panel     Status: Abnormal   Collection Time: 01/17/23 11:32 AM  Result Value Ref Range   Sodium 138 135 - 145 mmol/L   Potassium 4.3 3.5 - 5.1 mmol/L   Chloride 103 98 - 111 mmol/L   CO2 21 (L) 22 - 32 mmol/L   Glucose, Bld 168 (H) 70 - 99 mg/dL    Comment: Glucose reference range applies only to samples taken after fasting for at least 8 hours.   BUN 21 8 - 23 mg/dL   Creatinine, Ser 2.95 (H) 0.44 - 1.00 mg/dL   Calcium 9.2 8.9 - 28.4 mg/dL   GFR, Estimated 34 (L) >60 mL/min    Comment: (NOTE) Calculated using the CKD-EPI Creatinine Equation (2021)    Anion gap 14 5 - 15    Comment: Performed at [facility], [address] [place], [place] [address]  CBC     Status: None   Collection Time: 01/17/23 11:32 AM  Result Value Ref Range   WBC 7.3 4.0 - 10.5 K/uL   RBC 4.41 3.87 - 5.11 MIL/uL   Hemoglobin 13.0 12.0 - 15.0 g/dL   HCT 01.0 27.2 - 53.6 %   MCV 92.3 80.0 - 100.0 fL   MCH 29.5 26.0 - 34.0 pg   MCHC 31.9 30.0 - 36.0 g/dL   RDW 64.4 03.4 - 74.2 %   Platelets 218 150 - 400 K/uL   nRBC 0.0 0.0 - 0.2 %    Comment: Performed at [facility], [address] [place],  [place] [address]  Troponin I (High Sensitivity)     Status: Abnormal   Collection Time: 01/17/23 11:32 AM  Result Value Ref Range   Troponin I (High Sensitivity) 33 (H) <18 ng/L    Comment: (NOTE) Elevated high sensitivity troponin I (hsTnI) values and significant  changes across serial measurements may suggest ACS but many other  chronic and acute conditions are known to elevate hsTnI results.  Refer to the "Links" section for chest pain algorithms and additional  guidance. Performed at [facility], [address] [place], [place] [address]   Hemoglobin A1c     Status: Abnormal   Collection Time: 01/17/23 11:32 AM  Result Value Ref Range   Hgb A1c MFr Bld 5.9 (H) 4.8 - 5.6 %    Comment: (NOTE)         Prediabetes: 5.7 - 6.4         Diabetes: >6.4         Glycemic control for adults with diabetes: <7.0    Mean Plasma Glucose 123 mg/dL    Comment: (NOTE) Performed At: [facility] [address] [place], [place] [id number] Jolene Schimke MD [id number]   Troponin I (High Sensitivity)     Status: Abnormal   Collection Time: 01/17/23  3:51 PM  Result Value Ref Range   Troponin I (High Sensitivity) 35 (H) <18 ng/L    Comment: (NOTE) Elevated high sensitivity troponin I (hsTnI) values and significant  changes across serial measurements may suggest ACS but many other  chronic and acute conditions are known to elevate hsTnI results.  Refer to the "Links" section for chest pain algorithms and additional  guidance. Performed at [facility], [address] [place], [place] [address]   Protime-INR     Status: None   Collection Time: 01/17/23  3:51 PM  Result Value Ref Range   Prothrombin Time 13.2 11.4 - 15.2 seconds   INR 1.0 0.8 - 1.2    Comment: (NOTE) INR goal varies based on device and disease states. Performed at [facility], [address] [place], [place] [address]   APTT     Status: None   Collection Time: 01/17/23  3:51 PM   Result Value Ref Range   aPTT 27 24 - 36 seconds    Comment: Performed at [facility], [address] [place], [place] [address]  Miscellaneous [facility] test (send-out)     Status: None   Collection Time: 01/17/23  3:51 PM  Result Value Ref Range   [facility] test code [id number]    [facility] test name HIV SCREEN 4TH GEN WITH RFX     Comment: Performed at [facility] [place] [facility], [address] [place], [place] [address]   Misc [facility] result COMMENT     Comment: (NOTE) Test Ordered: [id number] HIV Ab/p24 Ag with Reflex HIV Ab/p24 Ag Screen           Note:                     BN     Non Reactive                                                  Reference Range: Non Reactive                          HIV-1/HIV-2 antibodies and HIV-1 p24 antigen were NOT detected. There is no laboratory evidence of HIV infection. HIV Negative Performed At: [facility] [address] [place], [place] [id number] Jolene Schimke MD [id number]   Urine Drug Screen, Qualitative [facility] only)     Status: Abnormal   Collection Time: 01/17/23  3:52 PM  Result Value Ref Range   Tricyclic, Ur Screen POSITIVE (A) NONE DETECTED   Amphetamines, Ur Screen NONE DETECTED NONE DETECTED   MDMA (Ecstasy)Ur Screen NONE DETECTED NONE DETECTED   Cocaine Metabolite,Ur [place] NONE DETECTED NONE DETECTED   Opiate, Ur Screen NONE DETECTED NONE DETECTED   Phencyclidine (PCP) Ur S NONE DETECTED NONE DETECTED   Cannabinoid 50 Ng, Ur [place] POSITIVE (A) NONE DETECTED   Barbiturates, Ur Screen NONE DETECTED NONE DETECTED   Benzodiazepine, Ur Scrn NONE DETECTED NONE DETECTED   Methadone Scn, Ur NONE DETECTED NONE DETECTED    Comment: (NOTE) Tricyclics + metabolites, urine    Cutoff 1000 ng/mL Amphetamines + metabolites, urine  Cutoff 1000 ng/mL MDMA (Ecstasy), urine              Cutoff 500 ng/mL Cocaine Metabolite, urine          Cutoff 300 ng/mL Opiate +  metabolites, urine        Cutoff 300 ng/mL Phencyclidine (PCP), urine          Cutoff 25 ng/mL Cannabinoid, urine                 Cutoff 50 ng/mL Barbiturates + metabolites, urine  Cutoff 200 ng/mL Benzodiazepine, urine              Cutoff 200 ng/mL Methadone, urine                   Cutoff 300 ng/mL  The urine drug screen provides only a preliminary, unconfirmed analytical test result and should not be used for non-medical purposes. Clinical consideration and professional judgment should be applied to any positive drug screen result due to possible interfering substances. A more specific alternate chemical method must be used in order to obtain a confirmed analytical result. Gas chromatography / mass spectrometry (GC/MS) is the preferred confirm atory method. Performed at [facility], [address] [place], [place] [address]   Troponin I (High Sensitivity)     Status: Abnormal   Collection Time: 01/17/23  6:59 PM  Result Value Ref Range   Troponin I (High Sensitivity) 27 (H) <18 ng/L    Comment: (NOTE) Elevated high sensitivity troponin I (hsTnI) values and significant  changes across serial measurements may suggest ACS but many other  chronic and acute conditions are known to elevate hsTnI results.  Refer to the "Links" section for chest pain algorithms and additional  guidance. Performed at [facility], [address] [place], [place] [address]   Troponin I (High Sensitivity)     Status: Abnormal   Collection Time: 01/17/23  8:56 PM  Result Value Ref Range   Troponin I (High Sensitivity) 28 (H) <18 ng/L    Comment: (NOTE) Elevated high sensitivity troponin I (hsTnI) values and significant  changes across serial measurements may suggest ACS but many other  chronic and acute conditions are known to elevate hsTnI results.  Refer to the "Links" section for chest pain algorithms and additional  guidance. Performed at [facility], [address] [place], [place] [address]   Lipid panel     Status: None   Collection  Time: 01/18/23  5:31 AM  Result Value Ref Range   Cholesterol 134 0 - 200 mg/dL   Triglycerides 42 <213 mg/dL   HDL 72 >08 mg/dL   Total CHOL/HDL Ratio 1.9 RATIO   VLDL 8 0 - 40 mg/dL   LDL Cholesterol 54 0 - 99 mg/dL    Comment:        Total Cholesterol/HDL:CHD Risk Coronary Heart Disease Risk Table                     Men   Women  1/2 Average Risk   3.4   3.3  Average Risk       5.0   4.4  2 X Average Risk   9.6   7.1  3 X Average Risk  23.4   11.0        Use the calculated Patient Ratio above and the CHD Risk Table to determine the patient's CHD Risk.        ATP III CLASSIFICATION (LDL):  <100     mg/dL   Optimal  657-846  mg/dL   Near or Above  Optimal  130-159  mg/dL   Borderline  528-413  mg/dL   High  >244     mg/dL   Very High Performed at [facility], [address] [place], [place] [address]   Basic metabolic panel     Status: Abnormal   Collection Time: 01/18/23  5:31 AM  Result Value Ref Range   Sodium 138 135 - 145 mmol/L   Potassium 4.4 3.5 - 5.1 mmol/L   Chloride 106 98 - 111 mmol/L   CO2 25 22 - 32 mmol/L   Glucose, Bld 90 70 - 99 mg/dL    Comment: Glucose reference range applies only to samples taken after fasting for at least 8 hours.   BUN 17 8 - 23 mg/dL   Creatinine, Ser 2.53 (H) 0.44 - 1.00 mg/dL   Calcium 9.1 8.9 - 66.4 mg/dL   GFR, Estimated 40 (L) >60 mL/min    Comment: (NOTE) Calculated using the CKD-EPI Creatinine Equation (2021)    Anion gap 7 5 - 15    Comment: Performed at [facility], [address] [place], [place] [address]  CBC     Status: Abnormal   Collection Time: 01/18/23  5:31 AM  Result Value Ref Range   WBC 3.9 (L) 4.0 - 10.5 K/uL   RBC 3.88 3.87 - 5.11 MIL/uL   Hemoglobin 11.6 (L) 12.0 - 15.0 g/dL   HCT 42.5 (L) 95.6 - 38.7 %   MCV 91.2 80.0 - 100.0 fL   MCH 29.9 26.0 - 34.0 pg   MCHC 32.8 30.0 - 36.0 g/dL   RDW 56.4 33.2 - 95.1 %   Platelets 185 150 - 400 K/uL   nRBC 0.0  0.0 - 0.2 %    Comment: Performed at [facility], [address] [place], [place] [address]  TSH     Status: None   Collection Time: 01/18/23  5:31 AM  Result Value Ref Range   TSH 4.208 0.350 - 4.500 uIU/mL    Comment: Performed by a 3rd Generation assay with a functional sensitivity of <=0.01 uIU/mL. Performed at [facility], [address] [place], [place] [address]   ECHOCARDIOGRAM COMPLETE     Status: None   Collection Time: 01/18/23  1:51 PM  Result Value Ref Range   Weight 3,393.32 oz   Height 72 in   BP 119/66 mmHg   Ao pk vel 1.40 m/s   AV Peak grad 7.8 mmHg   S' Lateral 3.00 cm   Area-P 1/2 3.45 cm2   Est EF 60 - 65%     PHQ2/9:    03/25/2023    9:49 AM 03/14/2023    1:38 PM 11/19/2022   11:01 AM 10/31/2022   11:44 AM 09/30/2022   10:30 AM  Depression screen PHQ 2/9  Decreased Interest 0 0 0 0 0  Down, Depressed, Hopeless 0 0 0 0 0  PHQ - 2 Score 0 0 0 0 0  Altered sleeping 0 0 0 0 0  Tired, decreased energy 0 0 1 1 3   Change in appetite 0 0 0 0 0  Feeling bad or failure about yourself  0 0 0 0 0  Trouble concentrating 0 0 0 0 0  Moving slowly or fidgety/restless 0 0 0 0 0  Suicidal thoughts 0 0 0 0 0  PHQ-9 Score 0 0 1 1 3     phq 9 is negative   Fall Risk:    03/25/2023  9:48 AM 03/14/2023    1:38 PM 11/19/2022   11:00 AM 10/31/2022   11:38 AM 09/30/2022   10:30 AM  Fall Risk   Falls in the past year? 0 0 0 0 0  Number falls in past yr:  0 0 0 0  Injury with Fall?  0 0 0 0  Risk for fall due to : No Fall Risks No Fall Risks No Fall Risks No Fall Risks No Fall Risks  Follow up Falls prevention discussed Falls prevention discussed Falls prevention discussed Education provided;Falls prevention discussed Falls prevention discussed    Assessment & Plan  1. Interstitial lung disease due to granulomatous disease (HCC)  Sees Dr. Belia Heman   2. Sickle cell trait (HCC)  Unchanged   3. Sarcoidosis of lung (HCC)  Under the care of Dr.  Belia Heman   4. Depression, major, in remission (HCC)  Continue visit with therapist   5. Secondary hyperparathyroidism of renal origin [facility])  Keep follow up with nephrologist   6. Aneurysm of celiac artery (HCC)  - atorvastatin (LIPITOR) 40 MG tablet; Take 1 tablet (40 mg total) by mouth daily.  Dispense: 90 tablet; Refill: 1 - ezetimibe (ZETIA) 10 MG tablet; Take 1 tablet (10 mg total) by mouth daily.  Dispense: 90 tablet; Refill: 1 - Ambulatory referral to Vascular Surgery  7. Atherosclerosis of aorta (HCC)  - atorvastatin (LIPITOR) 40 MG tablet; Take 1 tablet (40 mg total) by mouth daily.  Dispense: 90 tablet; Refill: 1 - ezetimibe (ZETIA) 10 MG tablet; Take 1 tablet (10 mg total) by mouth daily.  Dispense: 90 tablet; Refill: 1  8. Chronic kidney disease, stage 3b (HCC)  - losartan (COZAAR) 25 MG tablet; Take 1 tablet (25 mg total) by mouth daily.  Dispense: 90 tablet; Refill: 0  9. OSA on CPAP   10. Bilateral lower abdominal pain  - Ambulatory referral to Gastroenterology

## 2023-03-25 ENCOUNTER — Encounter: Payer: Self-pay | Admitting: Family Medicine

## 2023-03-25 ENCOUNTER — Encounter: Payer: Self-pay | Admitting: Cardiovascular Disease

## 2023-03-25 ENCOUNTER — Ambulatory Visit (INDEPENDENT_AMBULATORY_CARE_PROVIDER_SITE_OTHER): Payer: Medicare Other | Admitting: Family Medicine

## 2023-03-25 VITALS — BP 106/72 | HR 104 | Temp 97.4°F | Resp 14 | Ht 72.0 in | Wt 202.3 lb

## 2023-03-25 DIAGNOSIS — I700 Atherosclerosis of aorta: Secondary | ICD-10-CM

## 2023-03-25 DIAGNOSIS — R1031 Right lower quadrant pain: Secondary | ICD-10-CM

## 2023-03-25 DIAGNOSIS — Z23 Encounter for immunization: Secondary | ICD-10-CM

## 2023-03-25 DIAGNOSIS — J8489 Other specified interstitial pulmonary diseases: Secondary | ICD-10-CM | POA: Diagnosis not present

## 2023-03-25 DIAGNOSIS — D573 Sickle-cell trait: Secondary | ICD-10-CM

## 2023-03-25 DIAGNOSIS — G4733 Obstructive sleep apnea (adult) (pediatric): Secondary | ICD-10-CM

## 2023-03-25 DIAGNOSIS — N2581 Secondary hyperparathyroidism of renal origin: Secondary | ICD-10-CM | POA: Diagnosis not present

## 2023-03-25 DIAGNOSIS — D860 Sarcoidosis of lung: Secondary | ICD-10-CM | POA: Diagnosis not present

## 2023-03-25 DIAGNOSIS — I429 Cardiomyopathy, unspecified: Secondary | ICD-10-CM | POA: Insufficient documentation

## 2023-03-25 DIAGNOSIS — F331 Major depressive disorder, recurrent, moderate: Secondary | ICD-10-CM | POA: Insufficient documentation

## 2023-03-25 DIAGNOSIS — D71 Functional disorders of polymorphonuclear neutrophils: Secondary | ICD-10-CM

## 2023-03-25 DIAGNOSIS — N1832 Chronic kidney disease, stage 3b: Secondary | ICD-10-CM | POA: Diagnosis not present

## 2023-03-25 DIAGNOSIS — I728 Aneurysm of other specified arteries: Secondary | ICD-10-CM | POA: Diagnosis not present

## 2023-03-25 DIAGNOSIS — F325 Major depressive disorder, single episode, in full remission: Secondary | ICD-10-CM

## 2023-03-25 DIAGNOSIS — R1032 Left lower quadrant pain: Secondary | ICD-10-CM | POA: Diagnosis not present

## 2023-03-25 MED ORDER — EZETIMIBE 10 MG PO TABS: 10.0000 mg | ORAL_TABLET | Freq: Every day | ORAL | 1 refills | Status: DC

## 2023-03-25 MED ORDER — ATORVASTATIN CALCIUM 40 MG PO TABS: 40.0000 mg | ORAL_TABLET | Freq: Every day | ORAL | 1 refills | Status: DC

## 2023-03-25 MED ORDER — LOSARTAN POTASSIUM 25 MG PO TABS: 25.0000 mg | ORAL_TABLET | Freq: Every day | ORAL | 0 refills | Status: DC

## 2023-03-25 NOTE — Patient Instructions (Addendum)
Tdap and RSV

## 2023-03-26 DIAGNOSIS — F411 Generalized anxiety disorder: Secondary | ICD-10-CM | POA: Diagnosis not present

## 2023-04-01 ENCOUNTER — Ambulatory Visit: Payer: Medicare Other

## 2023-04-01 NOTE — Progress Notes (Signed)
Patient presented for blood pressure check as advised by Dr. Carlynn Purl. After 5 minutes of resting when called back, a reading of: 102/72 was obtained. Patient was pleased and had no questions or concerns to express at time of clinic departure. Physician notified of results and I will follow up with patient with any advice/recommendations.

## 2023-04-14 ENCOUNTER — Other Ambulatory Visit: Payer: Self-pay

## 2023-04-14 DIAGNOSIS — D860 Sarcoidosis of lung: Secondary | ICD-10-CM

## 2023-04-14 NOTE — Progress Notes (Signed)
Virtual Visit completed. Patient informed on EP and RD appointment and 6 Minute walk test. Patient also informed of patient health questionnaires on My Chart. Patient Verbalizes understanding. Visit diagnosis can be found in [facility] 09/30/2022.

## 2023-04-23 DIAGNOSIS — F411 Generalized anxiety disorder: Secondary | ICD-10-CM | POA: Diagnosis not present

## 2023-04-28 ENCOUNTER — Encounter: Payer: Medicare Other | Attending: Internal Medicine | Admitting: *Deleted

## 2023-04-28 VITALS — Ht 72.0 in | Wt 210.0 lb

## 2023-04-28 DIAGNOSIS — D860 Sarcoidosis of lung: Secondary | ICD-10-CM | POA: Insufficient documentation

## 2023-04-28 NOTE — Patient Instructions (Signed)
Patient Instructions  Patient Details  Name: Melissa [name] MRN: [id number] Date of Birth: 03-Mar-1951 Referring Provider:  Erin Fulling, MD  Below are your personal goals for exercise, nutrition, and risk factors. Our goal is to help you stay on track towards obtaining and maintaining these goals. We will be discussing your progress on these goals with you throughout the program.  Initial Exercise Prescription:  Initial Exercise Prescription - 04/28/23 1500       Date of Initial Exercise RX and Referring Provider   Date 04/28/23    Referring Provider Dr. Belia Heman      Oxygen   Maintain Oxygen Saturation 88% or higher      Treadmill   MPH 3    Grade 0    Minutes 15    METs 3.5      NuStep   Level 2   T6   SPM 80    Minutes 15    METs 3.45      Elliptical   Level 1    Speed 2.5    Minutes 15    METs 3.45      REL-XR   Level 2    Speed 50    Minutes 15    METs 3.45      T5 Nustep   Level 2    SPM 80    Minutes 15    METs 3.45      Prescription Details   Frequency (times per week) 3    Duration Progress to 30 minutes of continuous aerobic without signs/symptoms of physical distress      Intensity   THRR 40-80% of Max Heartrate 115-138    Ratings of Perceived Exertion 11-13    Perceived Dyspnea 0-4      Progression   Progression Continue to progress workloads to maintain intensity without signs/symptoms of physical distress.      Resistance Training   Training Prescription Yes    Weight 4    Reps 10-15             Exercise Goals: Frequency: Be able to perform aerobic exercise two to three times per week in program working toward 2-5 days per week of home exercise.  Intensity: Work with a perceived exertion of 11 (fairly light) - 15 (hard) while following your exercise prescription.  We will make changes to your prescription with you as you progress through the program.   Duration: Be able to do 30 to 45 minutes of continuous aerobic exercise in  addition to a 5 minute warm-up and a 5 minute cool-down routine.   Nutrition Goals: Your personal nutrition goals will be established when you do your nutrition analysis with the dietician.  The following are general nutrition guidelines to follow: Cholesterol < 200mg /day Sodium < 1500mg /day Fiber: Women over 50 yrs - 21 grams per day  Personal Goals:  Personal Goals and Risk Factors at Admission - 04/28/23 1512       Core Components/Risk Factors/Patient Goals on Admission    Weight Management Yes;Weight Loss    Intervention Weight Management/Obesity: Establish reasonable short term and long term weight goals.;Weight Management: Provide education and appropriate resources to help participant work on and attain dietary goals.;Weight Management: Develop a combined nutrition and exercise program designed to reach desired caloric intake, while maintaining appropriate intake of nutrient and fiber, sodium and fats, and appropriate energy expenditure required for the weight goal.    Admit Weight 210 lb (95.3 kg)    Goal Weight:  Short Term 200 lb (90.7 kg)    Goal Weight: Long Term 180 lb (81.6 kg)    Expected Outcomes Short Term: Continue to assess and modify interventions until short term weight is achieved;Long Term: Adherence to nutrition and physical activity/exercise program aimed toward attainment of established weight goal;Weight Loss: Understanding of general recommendations for a balanced deficit meal plan, which promotes 1-2 lb weight loss per week and includes a negative energy balance of [phone] kcal/d;Understanding recommendations for meals to include 15-35% energy as protein, 25-35% energy from fat, 35-60% energy from carbohydrates, less than 200mg  of dietary cholesterol, 20-35 gm of total fiber daily;Understanding of distribution of calorie intake throughout the day with the consumption of 4-5 meals/snacks    Improve shortness of breath with ADL's Yes    Intervention Provide education,  individualized exercise plan and daily activity instruction to help decrease symptoms of SOB with activities of daily living.    Expected Outcomes Short Term: Improve cardiorespiratory fitness to achieve a reduction of symptoms when performing ADLs;Long Term: Be able to perform more ADLs without symptoms or delay the onset of symptoms    Hypertension Yes    Intervention Provide education on lifestyle modifcations including regular physical activity/exercise, weight management, moderate sodium restriction and increased consumption of fresh fruit, vegetables, and low fat dairy, alcohol moderation, and smoking cessation.;Monitor prescription use compliance.    Expected Outcomes Short Term: Continued assessment and intervention until BP is < 140/57mm HG in hypertensive participants. < 130/40mm HG in hypertensive participants with diabetes, heart failure or chronic kidney disease.;Long Term: Maintenance of blood pressure at goal levels.    Lipids Yes    Intervention Provide education and support for participant on nutrition & aerobic/resistive exercise along with prescribed medications to achieve LDL 70mg , HDL >40mg .    Expected Outcomes Short Term: Participant states understanding of desired cholesterol values and is compliant with medications prescribed. Participant is following exercise prescription and nutrition guidelines.;Long Term: Cholesterol controlled with medications as prescribed, with individualized exercise RX and with personalized nutrition plan. Value goals: LDL < 70mg , HDL > 40 mg.             Tobacco Use Initial Evaluation: Social History   Tobacco Use  Smoking Status Never  Smokeless Tobacco Never  Tobacco Comments   Second hand smoke exposure    Exercise Goals and Review:  Exercise Goals     Row Name 04/28/23 1509             Exercise Goals   Increase Physical Activity Yes       Intervention Provide advice, education, support and counseling about physical  activity/exercise needs.;Develop an individualized exercise prescription for aerobic and resistive training based on initial evaluation findings, risk stratification, comorbidities and participant's personal goals.       Expected Outcomes Short Term: Attend rehab on a regular basis to increase amount of physical activity.;Long Term: Add in home exercise to make exercise part of routine and to increase amount of physical activity.;Long Term: Exercising regularly at least 3-5 days a week.       Increase Strength and Stamina Yes       Intervention Provide advice, education, support and counseling about physical activity/exercise needs.;Develop an individualized exercise prescription for aerobic and resistive training based on initial evaluation findings, risk stratification, comorbidities and participant's personal goals.       Expected Outcomes Short Term: Increase workloads from initial exercise prescription for resistance, speed, and METs.;Short Term: Perform resistance training exercises routinely during  rehab and add in resistance training at home;Long Term: Improve cardiorespiratory fitness, muscular endurance and strength as measured by increased METs and functional capacity ( )       Able to understand and use rate of perceived exertion (RPE) scale Yes       Intervention Provide education and explanation on how to use RPE scale       Expected Outcomes Short Term: Able to use RPE daily in rehab to express subjective intensity level;Long Term:  Able to use RPE to guide intensity level when exercising independently       Able to understand and use Dyspnea scale Yes       Intervention Provide education and explanation on how to use Dyspnea scale       Expected Outcomes Short Term: Able to use Dyspnea scale daily in rehab to express subjective sense of shortness of breath during exertion;Long Term: Able to use Dyspnea scale to guide intensity level when exercising independently       Knowledge and  understanding of Target Heart Rate Range (THRR) Yes       Intervention Provide education and explanation of THRR including how the numbers were predicted and where they are located for reference       Expected Outcomes Short Term: Able to state/look up THRR;Long Term: Able to use THRR to govern intensity when exercising independently;Short Term: Able to use daily as guideline for intensity in rehab       Able to check pulse independently Yes       Intervention Provide education and demonstration on how to check pulse in carotid and radial arteries.;Review the importance of being able to check your own pulse for safety during independent exercise       Expected Outcomes Short Term: Able to explain why pulse checking is important during independent exercise;Long Term: Able to check pulse independently and accurately       Understanding of Exercise Prescription Yes       Intervention Provide education, explanation, and written materials on patient's individual exercise prescription       Expected Outcomes Short Term: Able to explain program exercise prescription;Long Term: Able to explain home exercise prescription to exercise independently                Copy of goals given to participant.

## 2023-04-28 NOTE — Progress Notes (Signed)
Pulmonary Individual Treatment Plan  Patient Details  Name: Melissa [name] MRN: [id number] Date of Birth: 1951-04-15 Referring Provider:   Flowsheet Row Pulmonary Rehab from 04/28/2023 in [facility] Cardiac and Pulmonary Rehab  Referring Provider Dr. Belia Heman       Initial Encounter Date:  Flowsheet Row Pulmonary Rehab from 04/28/2023 in [facility] Cardiac and Pulmonary Rehab  Date 04/28/23       Visit Diagnosis: Sarcoidosis of lung (HCC)  Patient's Home Medications on Admission:  Current Outpatient Medications:    acetaminophen (TYLENOL) 650 MG CR tablet, Take 1,300 mg by mouth every 8 (eight) hours as needed for pain., Disp: , Rfl:    albuterol (VENTOLIN HFA) 108 (90 Base) MCG/ACT inhaler, INHALE 1-2 PUFFS BY MOUTH EVERY 6 HOURS AS NEEDED FOR WHEEZE OR SHORTNESS OF BREATH, Disp: 18 each, Rfl: 0   Ascorbic Acid (VITAMIN C) 1000 MG tablet, Take 1,000 mg by mouth daily., Disp: , Rfl:    aspirin EC 81 MG tablet, Take 1 tablet by mouth daily., Disp: , Rfl:    atorvastatin (LIPITOR) 40 MG tablet, Take 1 tablet (40 mg total) by mouth daily., Disp: 90 tablet, Rfl: 1   azelastine (ASTELIN) 0.1 % nasal spray, Place 2 sprays into both nostrils 2 (two) times daily. Use in each nostril as directed, Disp: 30 mL, Rfl: 12   budesonide (PULMICORT) 0.5 MG/2ML nebulizer solution, Take 2 mLs (0.5 mg total) by nebulization in the morning and at bedtime., Disp: 120 mL, Rfl: 6   calcium carbonate (TUMS - DOSED IN MG ELEMENTAL CALCIUM) 500 MG chewable tablet, Chew 500-1,000 mg by mouth daily as needed for indigestion or heartburn., Disp: , Rfl:    Cholecalciferol (D 1000) 25 MCG (1000 UT) capsule, Take 1,000 Units by mouth daily., Disp: , Rfl:    ezetimibe (ZETIA) 10 MG tablet, Take 1 tablet (10 mg total) by mouth daily., Disp: 90 tablet, Rfl: 1   ferrous sulfate 325 (65 FE) MG tablet, Take 325 mg by mouth daily with breakfast., Disp: , Rfl:    fluticasone (FLONASE) 50 MCG/ACT nasal spray, SPRAY 2 SPRAYS INTO EACH NOSTRIL  EVERY DAY, Disp: 48 mL, Rfl: 1   gabapentin (NEURONTIN) 100 MG capsule, Take 100-200 mg by mouth 2 (two) times daily. One in am and two at night, Disp: , Rfl:    ipratropium-albuterol (DUONEB) 0.5-2.5 (3) MG/3ML SOLN, Take 3 mLs by nebulization every 6 (six) hours as needed., Disp: 360 mL, Rfl: 3   levocetirizine (XYZAL ALLERGY 24HR) 5 MG tablet, Take 1 tablet (5 mg total) by mouth every evening. (Patient not taking: Reported on 04/14/2023), Disp: 30 tablet, Rfl: 6   losartan (COZAAR) 25 MG tablet, Take 1 tablet (25 mg total) by mouth daily., Disp: 90 tablet, Rfl: 0   magnesium oxide (MAG-OX) 400 MG tablet, Take 400 mg by mouth daily., Disp: , Rfl:    methocarbamol (ROBAXIN) 500 MG tablet, Take 500 mg by mouth every 8 (eight) hours as needed for muscle spasms. (Patient not taking: Reported on 04/14/2023), Disp: , Rfl:    Multiple Vitamins-Minerals (CENTRUM SILVER) tablet, Take 1 tablet by mouth daily., Disp: 30 tablet, Rfl: 0   nortriptyline (PAMELOR) 10 MG capsule, Take 10-20 mg by mouth at bedtime., Disp: , Rfl:    Probiotic Product (FORTIFY PROBIOTIC WOMENS EX ST PO), Take by mouth daily., Disp: , Rfl:    propranolol (INDERAL) 10 MG tablet, Take 1 tablet (10 mg total) by mouth 3 (three) times daily as needed. For sustained fast  heart rate, Disp: 60 tablet, Rfl: 3   TRELEGY ELLIPTA 200-62.5-25 MCG/ACT AEPB, INHALE ONE ACTUATION INTO THE LUNGS DAILY, Disp: 60 each, Rfl: 5   zinc gluconate 50 MG tablet, Take 50 mg by mouth daily., Disp: , Rfl:   Past Medical History: Past Medical History:  Diagnosis Date   Aortic atherosclerosis (HCC)    Asthma    Bruising    Cardiomyopathy (HCC)    Celiac artery aneurysm (HCC)    Cervical radiculopathy    CKD (chronic kidney disease), stage III (HCC)    Deficiency of vitamin B    Depression    Edema    Flank pain    Gross hematuria    History of 2019 novel coronavirus disease (COVID-19) 05/01/2019   History of bariatric surgery    HLD (hyperlipidemia)     HTN (hypertension)    Interstitial lung disease (HCC)    Iron deficiency anemia    Leukopenia    OA (osteoarthritis)    Obesity    OSA on CPAP    Osteopenia    Perennial allergic rhinitis    Pneumonia due to COVID-19 virus 2020   Renal cysts, acquired, bilateral    RLS (restless legs syndrome)    Sarcoidosis    Sickle cell trait (HCC)    Snoring    Spinal stenosis    Trigeminal neuralgia     Tobacco Use: Social History   Tobacco Use  Smoking Status Never  Smokeless Tobacco Never  Tobacco Comments   Second hand smoke exposure    Labs: Review Flowsheet  More data exists      Latest Ref Rng & Units 10/18/2020 04/10/2021 04/10/2022 01/17/2023 01/18/2023  Labs for ITP Cardiac and Pulmonary Rehab  Cholestrol 0 - 200 mg/dL - 811  914  - 782   LDL (calc) 0 - 99 mg/dL - 94  57  - 54   HDL-C >40 mg/dL - 92  82  - 72   Trlycerides <150 mg/dL - 72  50  - 42   Hemoglobin A1c 4.8 - 5.6 % - - - 5.9  -  TCO2 22 - 32 mmol/L 25  - - - -    Details             Pulmonary Assessment Scores:  Pulmonary Assessment Scores     Row Name 04/28/23 1515         ADL UCSD   ADL Phase Entry     SOB Score total 14     Rest 0     Walk 0     Stairs 2     Bath 0     Dress 1     Shop 2       CAT Score   CAT Score 6       mMRC Score   mMRC Score 1              UCSD: Self-administered rating of dyspnea associated with activities of daily living (ADLs) 6-point scale (0 = "not at all" to 5 = "maximal or unable to do because of breathlessness")  Scoring Scores range from 0 to 120.  Minimally important difference is 5 units  CAT: CAT can identify the health impairment of COPD patients and is better correlated with disease progression.  CAT has a scoring range of zero to 40. The CAT score is classified into four groups of low (less than 10), medium (10 - 20), high (21-30) and very high (31-40)  based on the impact level of disease on health status. A CAT score over 10 suggests  significant symptoms.  A worsening CAT score could be explained by an exacerbation, poor medication adherence, poor inhaler technique, or progression of COPD or comorbid conditions.  CAT MCID is 2 points  mMRC: mMRC (Modified Medical Research Council) Dyspnea Scale is used to assess the degree of baseline functional disability in patients of respiratory disease due to dyspnea. No minimal important difference is established. A decrease in score of 1 point or greater is considered a positive change.   Pulmonary Function Assessment:  Pulmonary Function Assessment - 04/14/23 1018       Breath   Shortness of Breath No             Exercise Target Goals: Exercise Program Goal: Individual exercise prescription set using results from initial 6 min walk test and THRR while considering  patient's activity barriers and safety.   Exercise Prescription Goal: Initial exercise prescription builds to 30-45 minutes a day of aerobic activity, 2-3 days per week.  Home exercise guidelines will be given to patient during program as part of exercise prescription that the participant will acknowledge.  Education: Aerobic Exercise: - Group verbal and visual presentation on the components of exercise prescription. Introduces F.I.T.T principle from ACSM for exercise prescriptions.  Reviews F.I.T.T. principles of aerobic exercise including progression. Written material given at graduation. Flowsheet Row Pulmonary Rehab from 04/28/2023 in [facility] Cardiac and Pulmonary Rehab  Education need identified 04/28/23       Education: Resistance Exercise: - Group verbal and visual presentation on the components of exercise prescription. Introduces F.I.T.T principle from ACSM for exercise prescriptions  Reviews F.I.T.T. principles of resistance exercise including progression. Written material given at graduation.    Education: Exercise & Equipment Safety: - Individual verbal instruction and demonstration of equipment  use and safety with use of the equipment. Flowsheet Row Pulmonary Rehab from 04/28/2023 in [facility] Cardiac and Pulmonary Rehab  Date 04/28/23  Educator [facility]  Instruction Review Code 1- Verbalizes Understanding       Education: Exercise Physiology & General Exercise Guidelines: - Group verbal and written instruction with models to review the exercise physiology of the cardiovascular system and associated critical values. Provides general exercise guidelines with specific guidelines to those with heart or lung disease.  Flowsheet Row Pulmonary Rehab from 04/28/2023 in [facility] Cardiac and Pulmonary Rehab  Education need identified 04/28/23       Education: Flexibility, Balance, Mind/Body Relaxation: - Group verbal and visual presentation with interactive activity on the components of exercise prescription. Introduces F.I.T.T principle from ACSM for exercise prescriptions. Reviews F.I.T.T. principles of flexibility and balance exercise training including progression. Also discusses the mind body connection.  Reviews various relaxation techniques to help reduce and manage stress (i.e. Deep breathing, progressive muscle relaxation, and visualization). Balance handout provided to take home. Written material given at graduation.   Activity Barriers & Risk Stratification:  Activity Barriers & Cardiac Risk Stratification - 04/28/23 1502       Activity Barriers & Cardiac Risk Stratification   Activity Barriers Right Knee Replacement;Back Problems;Other (comment)    Comments left knee stiffness and some hip pain             6 Minute Walk:  6 Minute Walk     Row Name 04/28/23 1459         6 Minute Walk   Phase Initial     Distance 1290 feet  Walk Time 6 minutes     # of Rest Breaks 0     MPH 2.44     METS 3.45     RPE 13     Perceived Dyspnea  0     VO2 Peak 12.06     Symptoms No     Resting HR 93 bpm     Resting BP 122/72     Resting Oxygen Saturation  95 %     Exercise Oxygen  Saturation  during 6 min walk 93 %     Max Ex. HR 130 bpm     Max Ex. BP 158/80     2 Minute Post BP 118/62       Interval HR   1 Minute HR 114     2 Minute HR 117     3 Minute HR 118     4 Minute HR 120     5 Minute HR 130     6 Minute HR 127     2 Minute Post HR 101     Interval Heart Rate? Yes       Interval Oxygen   Interval Oxygen? Yes     Baseline Oxygen Saturation % 95 %     1 Minute Oxygen Saturation % 98 %     1 Minute Liters of Oxygen 0 L     2 Minute Oxygen Saturation % 93 %     2 Minute Liters of Oxygen 0 L     3 Minute Oxygen Saturation % 94 %     3 Minute Liters of Oxygen 0 L     4 Minute Oxygen Saturation % 95 %     4 Minute Liters of Oxygen 0 L     5 Minute Oxygen Saturation % 93 %     5 Minute Liters of Oxygen 0 L     6 Minute Oxygen Saturation % 95 %     6 Minute Liters of Oxygen 0 L     2 Minute Post Oxygen Saturation % 94 %     2 Minute Post Liters of Oxygen 0 L             Oxygen Initial Assessment:  Oxygen Initial Assessment - 04/28/23 1514       Home Oxygen   Home Oxygen Device None    Sleep Oxygen Prescription CPAP    Liters per minute 0    Home Exercise Oxygen Prescription None    Home Resting Oxygen Prescription None    Compliance with Home Oxygen Use Yes      Initial 6 min Walk   Oxygen Used None      Program Oxygen Prescription   Program Oxygen Prescription None      Intervention   Short Term Goals To learn and exhibit compliance with exercise, home and travel O2 prescription;To learn and understand importance of monitoring SPO2 with pulse oximeter and demonstrate accurate use of the pulse oximeter.;To learn and understand importance of maintaining oxygen saturations>88%;To learn and demonstrate proper pursed lip breathing techniques or other breathing techniques. ;To learn and demonstrate proper use of respiratory medications    Long  Term Goals Exhibits compliance with exercise, home  and travel O2 prescription;Maintenance of O2  saturations>88%;Compliance with respiratory medication;Demonstrates proper use of MDI's;Exhibits proper breathing techniques, such as pursed lip breathing or other method taught during program session;Verbalizes importance of monitoring SPO2 with pulse oximeter and return demonstration  Oxygen Re-Evaluation:   Oxygen Discharge (Final Oxygen Re-Evaluation):   Initial Exercise Prescription:  Initial Exercise Prescription - 04/28/23 1500       Date of Initial Exercise RX and Referring Provider   Date 04/28/23    Referring Provider Dr. Belia Heman      Oxygen   Maintain Oxygen Saturation 88% or higher      Treadmill   MPH 3    Grade 0    Minutes 15    METs 3.5      NuStep   Level 2   T6   SPM 80    Minutes 15    METs 3.45      Elliptical   Level 1    Speed 2.5    Minutes 15    METs 3.45      REL-XR   Level 2    Speed 50    Minutes 15    METs 3.45      T5 Nustep   Level 2    SPM 80    Minutes 15    METs 3.45      Prescription Details   Frequency (times per week) 3    Duration Progress to 30 minutes of continuous aerobic without signs/symptoms of physical distress      Intensity   THRR 40-80% of Max Heartrate 115-138    Ratings of Perceived Exertion 11-13    Perceived Dyspnea 0-4      Progression   Progression Continue to progress workloads to maintain intensity without signs/symptoms of physical distress.      Resistance Training   Training Prescription Yes    Weight 4    Reps 10-15             Perform Capillary Blood Glucose checks as needed.  Exercise Prescription Changes:   Exercise Prescription Changes     Row Name 04/28/23 1500             Response to Exercise   Blood Pressure (Admit) 122/72       Blood Pressure (Exercise) 158/80       Blood Pressure (Exit) 118/62       Heart Rate (Admit) 93 bpm       Heart Rate (Exercise) 130 bpm       Heart Rate (Exit) 101 bpm       Oxygen Saturation (Admit) 95 %       Oxygen  Saturation (Exercise) 93 %       Oxygen Saturation (Exit) 94 %       Rating of Perceived Exertion (Exercise) 13       Perceived Dyspnea (Exercise) 0       Symptoms none       Comments 6 MWT results                Exercise Comments:   Exercise Goals and Review:   Exercise Goals     Row Name 04/28/23 1509             Exercise Goals   Increase Physical Activity Yes       Intervention Provide advice, education, support and counseling about physical activity/exercise needs.;Develop an individualized exercise prescription for aerobic and resistive training based on initial evaluation findings, risk stratification, comorbidities and participant's personal goals.       Expected Outcomes Short Term: Attend rehab on a regular basis to increase amount of physical activity.;Long Term: Add in home exercise to make exercise part of routine and  to increase amount of physical activity.;Long Term: Exercising regularly at least 3-5 days a week.       Increase Strength and Stamina Yes       Intervention Provide advice, education, support and counseling about physical activity/exercise needs.;Develop an individualized exercise prescription for aerobic and resistive training based on initial evaluation findings, risk stratification, comorbidities and participant's personal goals.       Expected Outcomes Short Term: Increase workloads from initial exercise prescription for resistance, speed, and METs.;Short Term: Perform resistance training exercises routinely during rehab and add in resistance training at home;Long Term: Improve cardiorespiratory fitness, muscular endurance and strength as measured by increased METs and functional capacity ( )       Able to understand and use rate of perceived exertion (RPE) scale Yes       Intervention Provide education and explanation on how to use RPE scale       Expected Outcomes Short Term: Able to use RPE daily in rehab to express subjective intensity  level;Long Term:  Able to use RPE to guide intensity level when exercising independently       Able to understand and use Dyspnea scale Yes       Intervention Provide education and explanation on how to use Dyspnea scale       Expected Outcomes Short Term: Able to use Dyspnea scale daily in rehab to express subjective sense of shortness of breath during exertion;Long Term: Able to use Dyspnea scale to guide intensity level when exercising independently       Knowledge and understanding of Target Heart Rate Range (THRR) Yes       Intervention Provide education and explanation of THRR including how the numbers were predicted and where they are located for reference       Expected Outcomes Short Term: Able to state/look up THRR;Long Term: Able to use THRR to govern intensity when exercising independently;Short Term: Able to use daily as guideline for intensity in rehab       Able to check pulse independently Yes       Intervention Provide education and demonstration on how to check pulse in carotid and radial arteries.;Review the importance of being able to check your own pulse for safety during independent exercise       Expected Outcomes Short Term: Able to explain why pulse checking is important during independent exercise;Long Term: Able to check pulse independently and accurately       Understanding of Exercise Prescription Yes       Intervention Provide education, explanation, and written materials on patient's individual exercise prescription       Expected Outcomes Short Term: Able to explain program exercise prescription;Long Term: Able to explain home exercise prescription to exercise independently                Exercise Goals Re-Evaluation :   Discharge Exercise Prescription (Final Exercise Prescription Changes):  Exercise Prescription Changes - 04/28/23 1500       Response to Exercise   Blood Pressure (Admit) 122/72    Blood Pressure (Exercise) 158/80    Blood Pressure (Exit)  118/62    Heart Rate (Admit) 93 bpm    Heart Rate (Exercise) 130 bpm    Heart Rate (Exit) 101 bpm    Oxygen Saturation (Admit) 95 %    Oxygen Saturation (Exercise) 93 %    Oxygen Saturation (Exit) 94 %    Rating of Perceived Exertion (Exercise) 13    Perceived Dyspnea (Exercise) 0  Symptoms none    Comments 6 MWT results             Nutrition:  Target Goals: Understanding of nutrition guidelines, daily intake of sodium 1500mg , cholesterol 200mg , calories 30% from fat and 7% or less from saturated fats, daily to have 5 or more servings of fruits and vegetables.  Education: All About Nutrition: -Group instruction provided by verbal, written material, interactive activities, discussions, models, and posters to present general guidelines for heart healthy nutrition including fat, fiber, MyPlate, the role of sodium in heart healthy nutrition, utilization of the nutrition label, and utilization of this knowledge for meal planning. Follow up email sent as well. Written material given at graduation. Flowsheet Row Pulmonary Rehab from 04/28/2023 in [facility] Cardiac and Pulmonary Rehab  Education need identified 04/28/23       Biometrics:  Pre Biometrics - 04/28/23 1511       Pre Biometrics   Height 6' (1.829 m)    Weight 210 lb (95.3 kg)    Waist Circumference 36 inches    Hip Circumference 47 inches    Waist to Hip Ratio 0.77 %    BMI (Calculated) 28.47    Single Leg Stand 9.43 seconds              Nutrition Therapy Plan and Nutrition Goals:  Nutrition Therapy & Goals - 04/28/23 1511       Nutrition Therapy   Diet Cardiac, Low Na    Protein (specify units) 90    Fiber 25 grams    Whole Grain Foods 3 servings    Saturated Fats 15 max. grams    Fruits and Vegetables 5 servings/day    Sodium 2 grams      Personal Nutrition Goals   Nutrition Goal Eat a small but nutrient dense breakfast most days the week    Personal Goal #2 Eat a protein and carb at each meal     Personal Goal #3 Cut back on sweets, look for healthier snacks    Comments Patient drinking water and coffee only. She reports she often skips lunch due to eating breakfast late, around 11am. She reports she wakes up early and goes to water aerobics without eating breakfast. Brainstormed things to make breakfast more achievable for her, focus was on smaller portions, quick to make, grab and go friendly. Educated on what makes a good meal and snack. Set goal to pair protein and carbs together at each meal. She finds herself snacking on sweets often, talked about better snacks to try more often, reducing the frequency of these sugary foods. Reviewed mediterranean diet handout, types of fats, sources, how to read label. Built out several meals and snacks with foods she likes and will eat. Focus on smaller portions, healthy fats and reducing sugar intake from poor food choices. She is very optimistic about making changes to her eating structure and will work on consistency.      Intervention Plan   Intervention Prescribe, educate and counsel regarding individualized specific dietary modifications aiming towards targeted core components such as weight, hypertension, lipid management, diabetes, heart failure and other comorbidities.;Nutrition handout(s) given to patient.    Expected Outcomes Long Term Goal: Adherence to prescribed nutrition plan.;Short Term Goal: A plan has been developed with personal nutrition goals set during dietitian appointment.;Short Term Goal: Understand basic principles of dietary content, such as calories, fat, sodium, cholesterol and nutrients.             Nutrition Assessments:  MEDIFICTS  Score Key: ?70 Need to make dietary changes  40-70 Heart Healthy Diet ? 40 Therapeutic Level Cholesterol Diet  Flowsheet Row Pulmonary Rehab from 04/28/2023 in [facility] Cardiac and Pulmonary Rehab  Picture Your Plate Total Score on Admission 48      Picture Your Plate Scores: <16 Unhealthy  dietary pattern with much room for improvement. 41-50 Dietary pattern unlikely to meet recommendations for good health and room for improvement. 51-60 More healthful dietary pattern, with some room for improvement.  >60 Healthy dietary pattern, although there may be some specific behaviors that could be improved.   Nutrition Goals Re-Evaluation:   Nutrition Goals Discharge (Final Nutrition Goals Re-Evaluation):   Psychosocial: Target Goals: Acknowledge presence or absence of significant depression and/or stress, maximize coping skills, provide positive support system. Participant is able to verbalize types and ability to use techniques and skills needed for reducing stress and depression.   Education: Stress, Anxiety, and Depression - Group verbal and visual presentation to define topics covered.  Reviews how body is impacted by stress, anxiety, and depression.  Also discusses healthy ways to reduce stress and to treat/manage anxiety and depression.  Written material given at graduation.   Education: Sleep Hygiene -Provides group verbal and written instruction about how sleep can affect your health.  Define sleep hygiene, discuss sleep cycles and impact of sleep habits. Review good sleep hygiene tips.    Initial Review & Psychosocial Screening:  Initial Psych Review & Screening - 04/14/23 1020       Initial Review   Current issues with None Identified;Current Anxiety/Panic;History of Depression      Family Dynamics   Good Support System? Yes    Comments SHe has a good support system. She can look to her sons for support and her youngest son lives with her. Sometiems she has anxiet over the normal stresses of life but nothing significant.      Barriers   Psychosocial barriers to participate in program The patient should benefit from training in stress management and relaxation.;There are no identifiable barriers or psychosocial needs.      Screening Interventions   Interventions  Encouraged to exercise;To provide support and resources with identified psychosocial needs;Provide feedback about the scores to participant    Expected Outcomes Short Term goal: Utilizing psychosocial counselor, staff and physician to assist with identification of specific Stressors or current issues interfering with healing process. Setting desired goal for each stressor or current issue identified.;Long Term Goal: Stressors or current issues are controlled or eliminated.;Short Term goal: Identification and review with participant of any Quality of Life or Depression concerns found by scoring the questionnaire.;Long Term goal: The participant improves quality of Life and PHQ9 Scores as seen by post scores and/or verbalization of changes             Quality of Life Scores:  Scores of 19 and below usually indicate a poorer quality of life in these areas.  A difference of  2-3 points is a clinically meaningful difference.  A difference of 2-3 points in the total score of the Quality of Life Index has been associated with significant improvement in overall quality of life, self-image, physical symptoms, and general health in studies assessing change in quality of life.  PHQ-9: Review Flowsheet  More data exists      04/28/2023 03/25/2023 03/14/2023 11/19/2022 10/31/2022  Depression screen PHQ 2/9  Decreased Interest 0 0 0 0 0  Down, Depressed, Hopeless 0 0 0 0 0  PHQ - 2  Score 0 0 0 0 0  Altered sleeping 0 0 0 0 0  Tired, decreased energy 1 0 0 1 1  Change in appetite 0 0 0 0 0  Feeling bad or failure about yourself  0 0 0 0 0  Trouble concentrating 1 0 0 0 0  Moving slowly or fidgety/restless 0 0 0 0 0  Suicidal thoughts 0 0 0 0 0  PHQ-9 Score 2 0 0 1 1  Difficult doing work/chores Not difficult at all - - - -    Details           Interpretation of Total Score  Total Score Depression Severity:  1-4 = Minimal depression, 5-9 = Mild depression, 10-14 = Moderate depression, 15-19 =  Moderately severe depression, 20-27 = Severe depression   Psychosocial Evaluation and Intervention:  Psychosocial Evaluation - 04/14/23 1022       Psychosocial Evaluation & Interventions   Interventions Encouraged to exercise with the program and follow exercise prescription;Relaxation education;Stress management education    Comments SHe has a good support system. She can look to her sons for support and her youngest son lives with her. Sometiems she has anxiet over the normal stresses of life but nothing significant.    Expected Outcomes Short: Start [facility] to help with mood. Long: Maintain a healthy mental state.    Continue Psychosocial Services  Follow up required by staff             Psychosocial Re-Evaluation:   Psychosocial Discharge (Final Psychosocial Re-Evaluation):   Education: Education Goals: Education classes will be provided on a weekly basis, covering required topics. Participant will state understanding/return demonstration of topics presented.  Learning Barriers/Preferences:  Learning Barriers/Preferences - 04/14/23 1018       Learning Barriers/Preferences   Learning Barriers None    Learning Preferences None             General Pulmonary Education Topics:  Infection Prevention: - Provides verbal and written material to individual with discussion of infection control including proper hand washing and proper equipment cleaning during exercise session. Flowsheet Row Pulmonary Rehab from 04/28/2023 in [facility] Cardiac and Pulmonary Rehab  Date 04/28/23  Educator [facility]  Instruction Review Code 1- Verbalizes Understanding       Falls Prevention: - Provides verbal and written material to individual with discussion of falls prevention and safety. Flowsheet Row Pulmonary Rehab from 04/28/2023 in [facility] Cardiac and Pulmonary Rehab  Date 04/28/23  Educator [facility]  Instruction Review Code 1- Verbalizes Understanding       Chronic Lung Disease Review: - Group  verbal instruction with posters, models, PowerPoint presentations and videos,  to review new updates, new respiratory medications, new advancements in procedures and treatments. Providing information on websites and "800" numbers for continued self-education. Includes information about supplement oxygen, available portable oxygen systems, continuous and intermittent flow rates, oxygen safety, concentrators, and Medicare reimbursement for oxygen. Explanation of Pulmonary Drugs, including class, frequency, complications, importance of spacers, rinsing mouth after steroid MDI's, and proper cleaning methods for nebulizers. Review of basic lung anatomy and physiology related to function, structure, and complications of lung disease. Review of risk factors. Discussion about methods for diagnosing sleep apnea and types of masks and machines for OSA. Includes a review of the use of types of environmental controls: home humidity, furnaces, filters, dust mite/pet prevention, HEPA vacuums. Discussion about weather changes, air quality and the benefits of nasal washing. Instruction on Warning signs, infection symptoms, calling MD promptly, preventive  modes, and value of vaccinations. Review of effective airway clearance, coughing and/or vibration techniques. Emphasizing that all should Create an Action Plan. Written material given at graduation. Flowsheet Row Pulmonary Rehab from 04/28/2023 in [facility] Cardiac and Pulmonary Rehab  Education need identified 04/28/23       AED/CPR: - Group verbal and written instruction with the use of models to demonstrate the basic use of the AED with the basic ABC's of resuscitation.    Anatomy and Cardiac Procedures: - Group verbal and visual presentation and models provide information about basic cardiac anatomy and function. Reviews the testing methods done to diagnose heart disease and the outcomes of the test results. Describes the treatment choices: Medical Management,  Angioplasty, or Coronary Bypass Surgery for treating various heart conditions including Myocardial Infarction, Angina, Valve Disease, and Cardiac Arrhythmias.  Written material given at graduation.   Medication Safety: - Group verbal and visual instruction to review commonly prescribed medications for heart and lung disease. Reviews the medication, class of the drug, and side effects. Includes the steps to properly store meds and maintain the prescription regimen.  Written material given at graduation.   Other: -Provides group and verbal instruction on various topics (see comments)   Knowledge Questionnaire Score:  Knowledge Questionnaire Score - 04/28/23 1513       Knowledge Questionnaire Score   Pre Score 11/18              Core Components/Risk Factors/Patient Goals at Admission:  Personal Goals and Risk Factors at Admission - 04/28/23 1512       Core Components/Risk Factors/Patient Goals on Admission    Weight Management Yes;Weight Loss    Intervention Weight Management/Obesity: Establish reasonable short term and long term weight goals.;Weight Management: Provide education and appropriate resources to help participant work on and attain dietary goals.;Weight Management: Develop a combined nutrition and exercise program designed to reach desired caloric intake, while maintaining appropriate intake of nutrient and fiber, sodium and fats, and appropriate energy expenditure required for the weight goal.    Admit Weight 210 lb (95.3 kg)    Goal Weight: Short Term 200 lb (90.7 kg)    Goal Weight: Long Term 180 lb (81.6 kg)    Expected Outcomes Short Term: Continue to assess and modify interventions until short term weight is achieved;Long Term: Adherence to nutrition and physical activity/exercise program aimed toward attainment of established weight goal;Weight Loss: Understanding of general recommendations for a balanced deficit meal plan, which promotes 1-2 lb weight loss per week  and includes a negative energy balance of [id number] kcal/d;Understanding recommendations for meals to include 15-35% energy as protein, 25-35% energy from fat, 35-60% energy from carbohydrates, less than 200mg  of dietary cholesterol, 20-35 gm of total fiber daily;Understanding of distribution of calorie intake throughout the day with the consumption of 4-5 meals/snacks    Improve shortness of breath with ADL's Yes    Intervention Provide education, individualized exercise plan and daily activity instruction to help decrease symptoms of SOB with activities of daily living.    Expected Outcomes Short Term: Improve cardiorespiratory fitness to achieve a reduction of symptoms when performing ADLs;Long Term: Be able to perform more ADLs without symptoms or delay the onset of symptoms    Hypertension Yes    Intervention Provide education on lifestyle modifcations including regular physical activity/exercise, weight management, moderate sodium restriction and increased consumption of fresh fruit, vegetables, and low fat dairy, alcohol moderation, and smoking cessation.;Monitor prescription use compliance.    Expected Outcomes Short  Term: Continued assessment and intervention until BP is < 140/31mm HG in hypertensive participants. < 130/4mm HG in hypertensive participants with diabetes, heart failure or chronic kidney disease.;Long Term: Maintenance of blood pressure at goal levels.    Lipids Yes    Intervention Provide education and support for participant on nutrition & aerobic/resistive exercise along with prescribed medications to achieve LDL 70mg , HDL >40mg .    Expected Outcomes Short Term: Participant states understanding of desired cholesterol values and is compliant with medications prescribed. Participant is following exercise prescription and nutrition guidelines.;Long Term: Cholesterol controlled with medications as prescribed, with individualized exercise RX and with personalized nutrition plan. Value  goals: LDL < 70mg , HDL > 40 mg.             Education:Diabetes - Individual verbal and written instruction to review signs/symptoms of diabetes, desired ranges of glucose level fasting, after meals and with exercise. Acknowledge that pre and post exercise glucose checks will be done for 3 sessions at entry of program.   Know Your Numbers and Heart Failure: - Group verbal and visual instruction to discuss disease risk factors for cardiac and pulmonary disease and treatment options.  Reviews associated critical values for Overweight/Obesity, Hypertension, Cholesterol, and Diabetes.  Discusses basics of heart failure: signs/symptoms and treatments.  Introduces Heart Failure Zone chart for action plan for heart failure.  Written material given at graduation. Flowsheet Row Pulmonary Rehab from 04/28/2023 in [facility] Cardiac and Pulmonary Rehab  Education need identified 04/28/23       Core Components/Risk Factors/Patient Goals Review:    Core Components/Risk Factors/Patient Goals at Discharge (Final Review):    ITP Comments:  ITP Comments     Row Name 04/14/23 1016 04/28/23 1458         ITP Comments Virtual Visit completed. Patient informed on EP and RD appointment and 6 Minute walk test. Patient also informed of patient health questionnaires on My Chart. Patient Verbalizes understanding. Visit diagnosis can be found in [facility] 09/30/2022. Completed and gym orientation. Initial ITP created and sent for review to Dr. Jinny Sanders, Medical Director.               Comments: initial ITP

## 2023-04-28 NOTE — Progress Notes (Signed)
Assessment start time: 2:18 PM  Digestive issues/concerns: allergic to shellfish  24-hours Recall: B: oatmeal or egg with breakfast meat and toast L: rarely eats lunch, might have a snack like cake or sugary treat D: proportioned, prepared dinner - starch, veggies, grilled chicken or salmon  Beverages water with crystal light, coffee (one cup per day) Alcohol none Caffeine coffee  Supplements MVI, Vit D, iron, zinc, calcium, magnesium  Intake Patterns eats smaller portion, but more frequent.    Education r/t nutrition plan Patient drinking water and coffee only. She reports she often skips lunch due to eating breakfast late, around 11am. She reports she wakes up early and goes to water aerobics without eating breakfast. Brainstormed things to make breakfast more achievable for her, focus was on smaller portions, quick to make, grab and go friendly. Educated on what makes a good meal and snack. Set goal to pair protein and carbs together at each meal. She finds herself snacking on sweets often, talked about better snacks to try more often, reducing the frequency of these sugary foods. Reviewed mediterranean diet handout, types of fats, sources, how to read label. Built out several meals and snacks with foods she likes and will eat. Focus on smaller portions, healthy fats and reducing sugar intake from poor food choices. She is very optimistic about making changes to her eating structure and will work on consistency.    Goal 1: Eat a small but nutrient dense breakfast most days the week Goal 2: Eat a protein and carb at each meal  Goal 3: Cut back on sweets, look for healthier snacks   End time 3:03 PM

## 2023-04-30 ENCOUNTER — Ambulatory Visit: Payer: Medicare Other

## 2023-05-02 ENCOUNTER — Ambulatory Visit: Payer: Medicare Other

## 2023-05-05 ENCOUNTER — Ambulatory Visit: Payer: Medicare Other

## 2023-05-06 ENCOUNTER — Encounter: Payer: Medicare Other | Admitting: *Deleted

## 2023-05-06 DIAGNOSIS — D860 Sarcoidosis of lung: Secondary | ICD-10-CM | POA: Diagnosis not present

## 2023-05-06 NOTE — Progress Notes (Signed)
Daily Session Note  Patient Details  Name: SHANEICE STEPANSKI MRN: [id number] Date of Birth: 1951/01/07 Referring Provider:   Flowsheet Row Pulmonary Rehab from 04/28/2023 in [facility] Cardiac and Pulmonary Rehab  Referring Provider Dr. Belia Heman       Encounter Date: 05/06/2023  Check In:  Session Check In - 05/06/23 1149       Check-In   Supervising physician immediately available to respond to emergencies See telemetry face sheet for immediately available ER MD    Location [facility]-Cardiac & Pulmonary Rehab    Staff Present Cora Collum, RN, BSN, CCRP;Noah Tickle, BS, Exercise Physiologist;Meredith Jewel Baize, RN BSN;Maxon Conetta BS, , Exercise Physiologist;Margaret Best, MS, Exercise Physiologist    Virtual Visit No    Medication changes reported     No    Fall or balance concerns reported    No    Warm-up and Cool-down Performed on first and last piece of equipment    Resistance Training Performed Yes    VAD Patient? No    PAD/SET Patient? No      Pain Assessment   Currently in Pain? No/denies                Social History   Tobacco Use  Smoking Status Never  Smokeless Tobacco Never  Tobacco Comments   Second hand smoke exposure    Goals Met:  Proper associated with RPD/PD & O2 Sat Independence with exercise equipment Exercise tolerated well No report of concerns or symptoms today  Goals Unmet:  Not Applicable  Comments: Pt able to follow exercise prescription today without complaint.  Will continue to monitor for progression.    Dr. Bethann Punches is Medical Director for [facility] Cardiac Rehabilitation.  Dr. Vida Rigger is Medical Director for [facility] Pulmonary Rehabilitation.

## 2023-05-07 ENCOUNTER — Ambulatory Visit: Payer: Medicare Other

## 2023-05-07 DIAGNOSIS — F411 Generalized anxiety disorder: Secondary | ICD-10-CM | POA: Diagnosis not present

## 2023-05-08 ENCOUNTER — Encounter: Payer: Medicare Other | Admitting: *Deleted

## 2023-05-08 DIAGNOSIS — D860 Sarcoidosis of lung: Secondary | ICD-10-CM | POA: Diagnosis not present

## 2023-05-08 NOTE — Progress Notes (Signed)
Daily Session Note  Patient Details  Name: LEELOO LONGIE MRN: [id number] Date of Birth: [birth year] Referring Provider:   Flowsheet Row Pulmonary Rehab from 04/28/2023 in [facility] Cardiac and Pulmonary Rehab  Referring Provider Dr. Belia Heman       Encounter Date: 05/08/2023  Check In:  Session Check In - 05/08/23 1136       Check-In   Supervising physician immediately available to respond to emergencies See telemetry face sheet for immediately available ER MD    Location [facility]-Cardiac & Pulmonary Rehab    Staff Present Ronette Deter, BS, Exercise Physiologist;Meredith Jewel Baize, RN BSN;Joseph Shelbie Proctor, RN, [place]    Virtual Visit No    Medication changes reported     No    Fall or balance concerns reported    No    Warm-up and Cool-down Performed on first and last piece of equipment    Resistance Training Performed Yes    VAD Patient? No    PAD/SET Patient? No      Pain Assessment   Currently in Pain? No/denies                Social History   Tobacco Use  Smoking Status Never  Smokeless Tobacco Never  Tobacco Comments   Second hand smoke exposure    Goals Met:  Independence with exercise equipment Exercise tolerated well No report of concerns or symptoms today Strength training completed today  Goals Unmet:  Not Applicable  Comments: Pt able to follow exercise prescription today without complaint.  Will continue to monitor for progression.    Dr. Bethann Punches is Medical Director for [facility] Cardiac Rehabilitation.  Dr. Vida Rigger is Medical Director for [facility] Pulmonary Rehabilitation.

## 2023-05-09 ENCOUNTER — Ambulatory Visit: Payer: Medicare Other

## 2023-05-12 ENCOUNTER — Ambulatory Visit: Payer: Medicare Other

## 2023-05-12 IMAGING — DX DG CHEST 2V
2 series · 2 of 2 positions shown · non-contrast
Comparison: Chest radiograph dated 09/07/2019.

CLINICAL DATA: Shortness of breath.

EXAM:
CHEST - 2 VIEW

[chest pa]
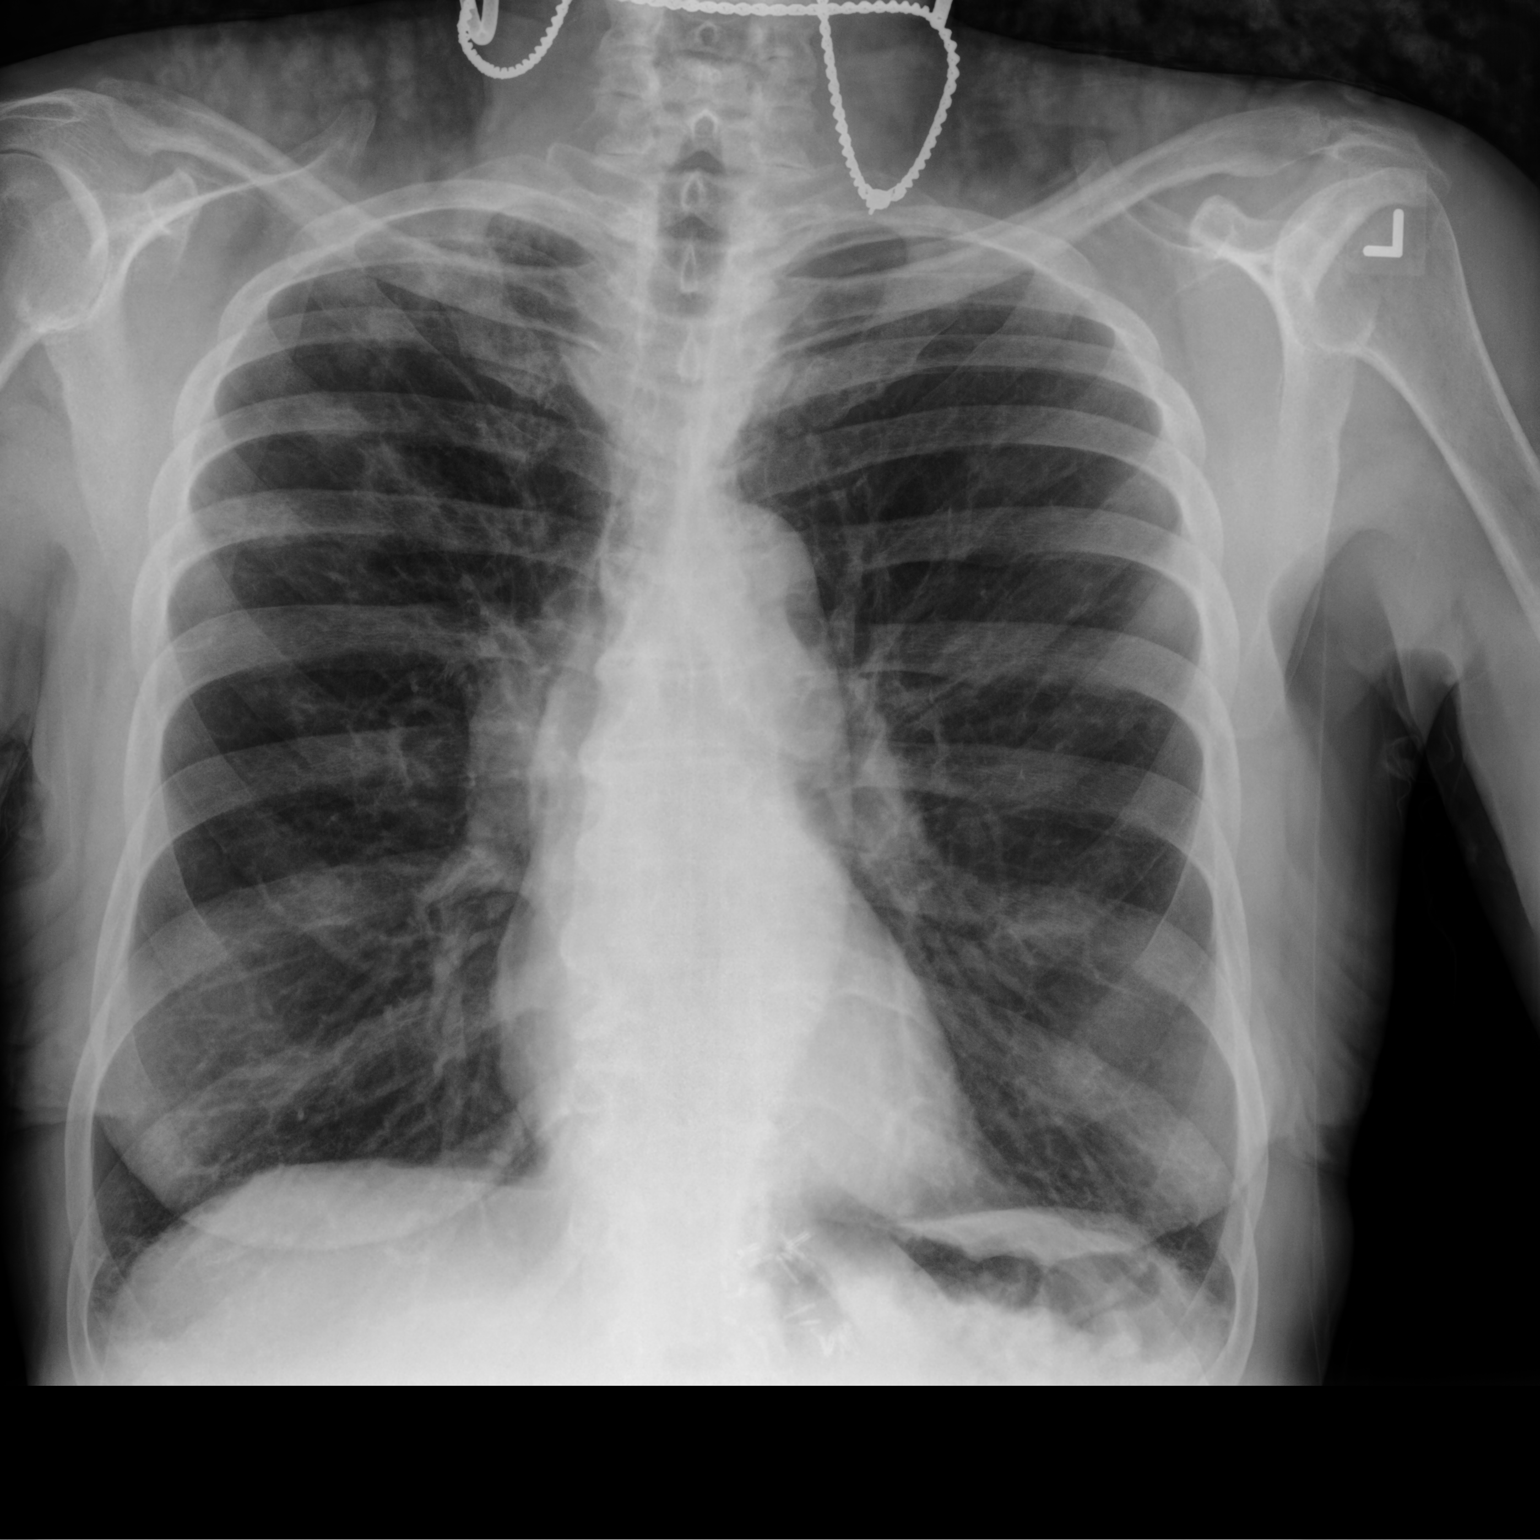

[chest lat]
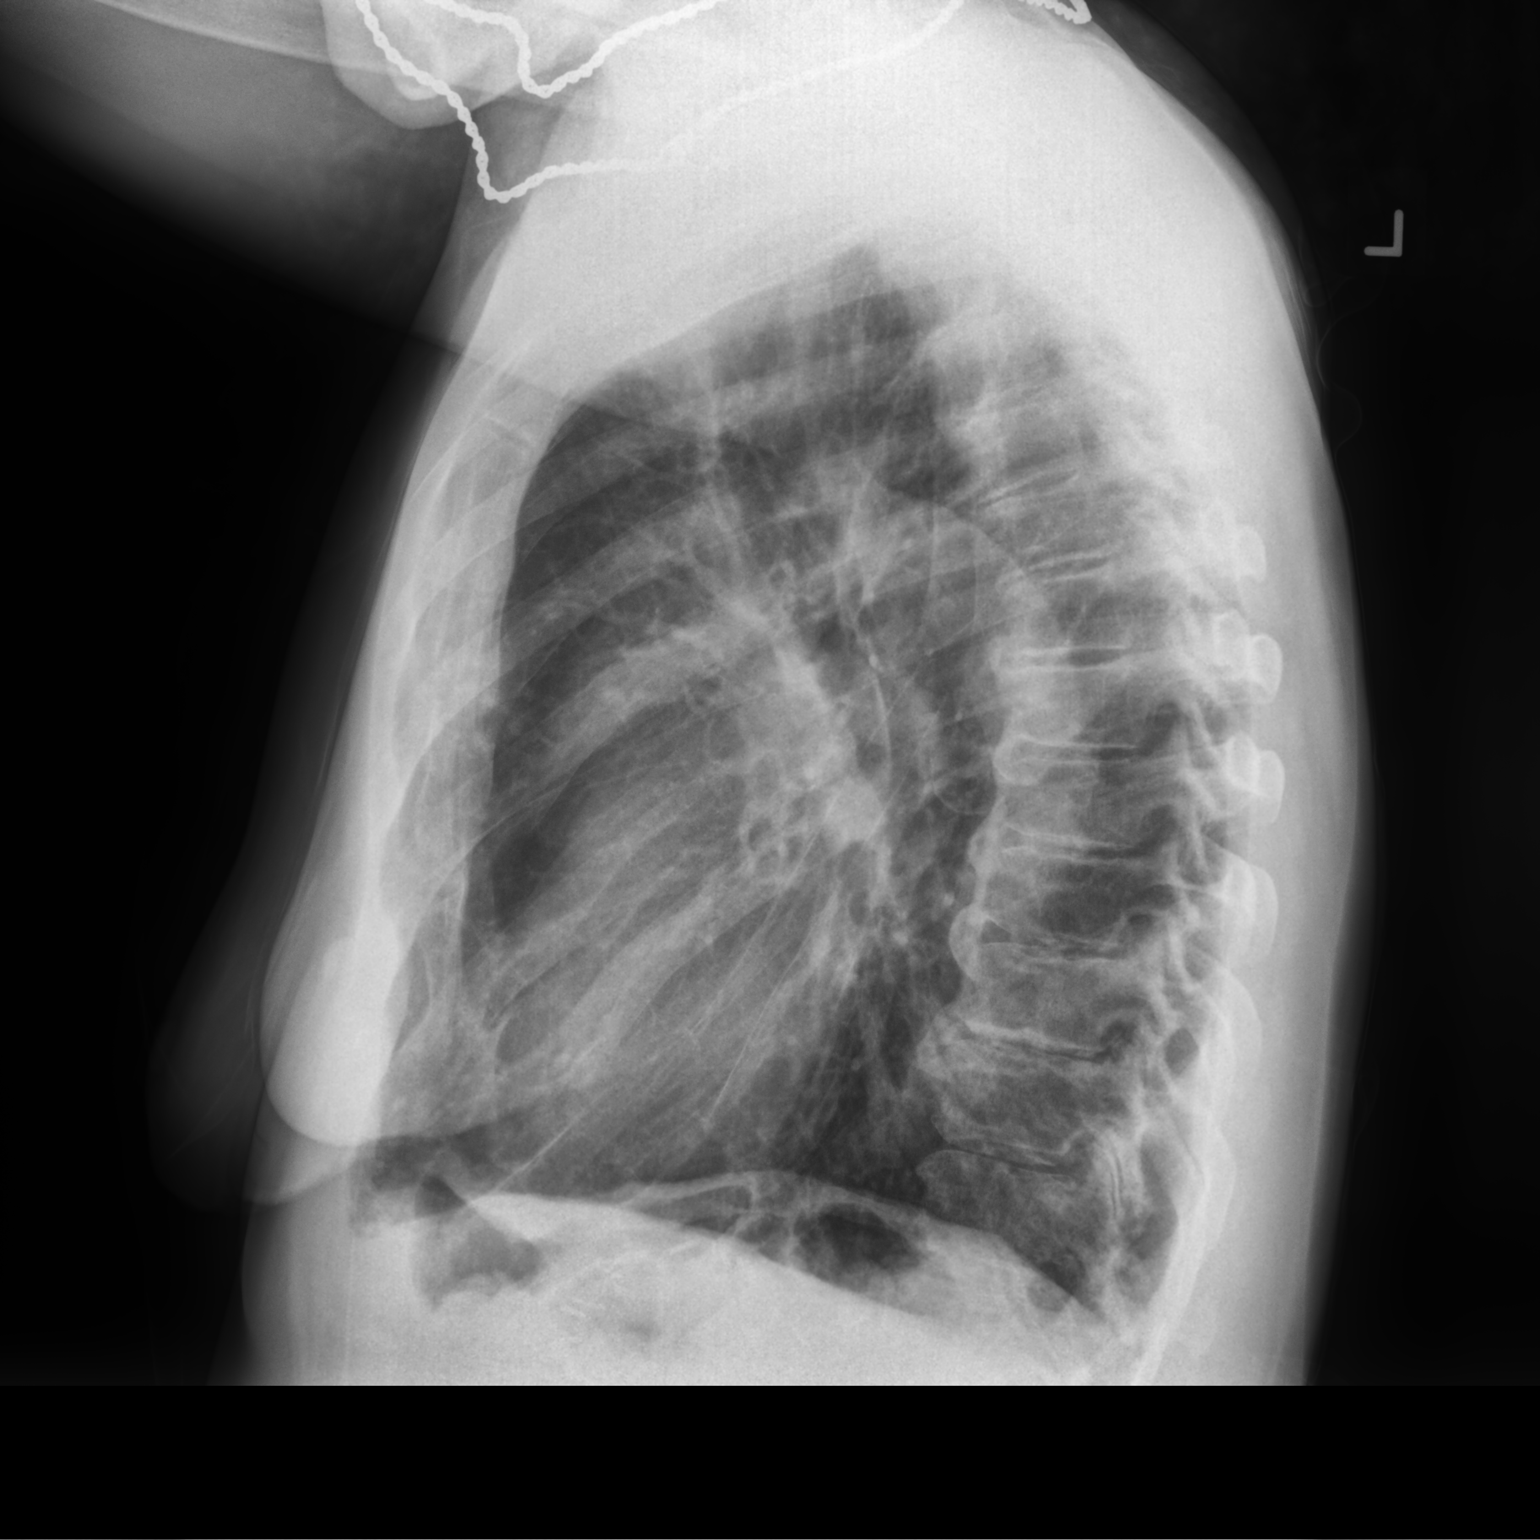

[2 of 2 positions shown; findings below may reference images not displayed]

FINDINGS: Bilateral ill-defined nodular densities predominantly in the right
upper lobe, likely chronic inflammatory process. No new
consolidation. There is no pleural effusion pneumothorax. The
cardiac silhouette is within normal limits. No acute osseous
pathology. Degenerative changes of the spine.
IMPRESSION: No active cardiopulmonary disease.

## 2023-05-13 ENCOUNTER — Other Ambulatory Visit: Payer: Self-pay

## 2023-05-13 ENCOUNTER — Encounter: Payer: Medicare Other | Admitting: *Deleted

## 2023-05-13 DIAGNOSIS — D860 Sarcoidosis of lung: Secondary | ICD-10-CM

## 2023-05-13 NOTE — Progress Notes (Signed)
Daily Session Note  Patient Details  Name: Melissa [name] MRN: [id number] Date of Birth: 1951/06/19 Referring Provider:   Flowsheet Row Pulmonary Rehab from 04/28/2023 in [facility] Cardiac and Pulmonary Rehab  Referring Provider Dr. Belia Heman       Encounter Date: 05/13/2023  Check In:  Session Check In - 05/13/23 1105       Check-In   Supervising physician immediately available to respond to emergencies See telemetry face sheet for immediately available ER MD    Location [facility]-Cardiac & Pulmonary Rehab    Staff Present Ronette Deter, BS, Exercise Physiologist;Meredith Jewel Baize, RN BSN;Maxon Conetta BS, , Exercise Physiologist;Aryah Doering Katrinka Blazing, RN, ADN    Virtual Visit No    Medication changes reported     No    Fall or balance concerns reported    No    Warm-up and Cool-down Performed on first and last piece of equipment    Resistance Training Performed Yes    VAD Patient? No    PAD/SET Patient? No      Pain Assessment   Currently in Pain? No/denies                Social History   Tobacco Use  Smoking Status Never  Smokeless Tobacco Never  Tobacco Comments   Second hand smoke exposure    Goals Met:  Independence with exercise equipment Exercise tolerated well No report of concerns or symptoms today Strength training completed today  Goals Unmet:  Not Applicable  Comments: Pt able to follow exercise prescription today without complaint.  Will continue to monitor for progression.    Dr. Bethann Punches is Medical Director for [facility] Cardiac Rehabilitation.  Dr. Vida Rigger is Medical Director for [facility] Pulmonary Rehabilitation.

## 2023-05-14 ENCOUNTER — Ambulatory Visit: Payer: Medicare Other

## 2023-05-15 ENCOUNTER — Encounter: Payer: Medicare Other | Admitting: *Deleted

## 2023-05-15 DIAGNOSIS — D860 Sarcoidosis of lung: Secondary | ICD-10-CM

## 2023-05-15 NOTE — Progress Notes (Signed)
Daily Session Note  Patient Details  Name: Melissa [name] MRN: [id number] Date of Birth: 23-Jul-1951 Referring Provider:   Flowsheet Row Pulmonary Rehab from 04/28/2023 in [facility] Cardiac and Pulmonary Rehab  Referring Provider Dr. Belia Heman       Encounter Date: 05/15/2023  Check In:  Session Check In - 05/15/23 1132       Check-In   Supervising physician immediately available to respond to emergencies See telemetry face sheet for immediately available ER MD    Location [facility]-Cardiac & Pulmonary Rehab    Staff Present Susann Givens, RN BSN;Laureen Manson Passey, BS, RRT, CPFT;Maxon Conetta BS, , Exercise Physiologist;Panzy Bubeck Katrinka Blazing, RN, ADN    Virtual Visit No    Medication changes reported     No    Fall or balance concerns reported    No    Warm-up and Cool-down Performed on first and last piece of equipment    Resistance Training Performed Yes    VAD Patient? No    PAD/SET Patient? No      Pain Assessment   Currently in Pain? No/denies                Social History   Tobacco Use  Smoking Status Never  Smokeless Tobacco Never  Tobacco Comments   Second hand smoke exposure    Goals Met:  Independence with exercise equipment Exercise tolerated well No report of concerns or symptoms today Strength training completed today  Goals Unmet:  Not Applicable  Comments: Pt able to follow exercise prescription today without complaint.  Will continue to monitor for progression.    Dr. Bethann Punches is Medical Director for [facility] Cardiac Rehabilitation.  Dr. Vida Rigger is Medical Director for [facility] Pulmonary Rehabilitation.

## 2023-05-16 ENCOUNTER — Ambulatory Visit: Payer: Medicare Other

## 2023-05-18 NOTE — Progress Notes (Unsigned)
Melissa Amy, [name] [address]  [address]  [place], [place] [address]  Main: [phone]  Fax: [phone]   Gastroenterology Consultation  Referring Provider:     Alba Cory, [name] Primary Care Physician:  Melissa Cory, [name] Primary Gastroenterologist:  Melissa Amy, [name] / Melissa [name]   Reason for Consultation:     Lower abdominal pain        HPI:   Melissa [name] is a 72 y.o. y/o female referred for consultation & management  by Melissa Cory, [name]. patient states she has had intermittent bilateral lower abdominal pain for many years.  Comes and goes.  She tried Linzess for constipation which worked, however she discontinued.  She started OTC probiotic with benefit.  She is currently having a soft bowel movement once daily.  She denies any recent hard stools or straining.  Constipation improved on probiotic.  She had negative urology evaluation for lower abdominal pain.  Has chronic low back pain and is followed by orthopedic.  Admits to fatigue.  Denies rectal bleeding or unintentional weight loss.  History of chronic constipation and GERD.  History of multiple adenomatous colon polyps.  History of gastric bypass in 2002.  Hx Chronic Anemia (lifelong).  Hx of CKD.  Has family history of her sister who had colon cancer.  CTA abdomen and pelvis with and without contrast 09/2022 showed extensive atherosclerosis with no significant stenosis.  Multiple bilateral benign kidney cysts.  No acute abnormality.  Previous gastric bypass.  Labs 01/18/2023 showed TSH.  Mild anemia with hemoglobin 11.6, MCV 91.  Stable CKD with BUN 17, creatinine 1.43, GFR 40.  Normal electrolytes.  Colonoscopy 02/2022 -by Dr. Allegra [name] -20 mm sessile tubular adenoma polyp removed from transverse colon; 2 small (3 to 4 mm) tubular adenoma polyps removed from descending colon and cecum.  No dysplasia.  Pandiverticulosis.  3-year repeat (due 02/2025).  -Colonoscopy 02/2019 - 6 small (3 mm to 5 mm)  adenomatous polyps removed.  Medium external hemorrhoids and diverticulosis. -Colonoscopy 02/2014 -2 small adenomatous polyps removed.  Internal hemorrhoids.  Sigmoid diverticulosis.   Past Medical History:  Diagnosis Date   Aortic atherosclerosis (HCC)    Asthma    Bruising    Cardiomyopathy (HCC)    Celiac artery aneurysm (HCC)    Cervical radiculopathy    CKD (chronic kidney disease), stage III (HCC)    Deficiency of vitamin B    Depression    Edema    Flank pain    Gross hematuria    History of 2019 novel coronavirus disease (COVID-19) 05/01/2019   History of bariatric surgery    HLD (hyperlipidemia)    HTN (hypertension)    Interstitial lung disease (HCC)    Iron deficiency anemia    Leukopenia    OA (osteoarthritis)    Obesity    OSA on CPAP    Osteopenia    Perennial allergic rhinitis    Pneumonia due to COVID-19 virus 2020   Renal cysts, acquired, bilateral    RLS (restless legs syndrome)    Sarcoidosis    Sickle cell trait (HCC)    Snoring    Spinal stenosis    Trigeminal neuralgia     Past Surgical History:  Procedure Laterality Date   BARIATRIC SURGERY     CESAREAN SECTION     3 or more   COLONOSCOPY WITH PROPOFOL N/A 03/15/2019   Procedure: COLONOSCOPY WITH PROPOFOL;  Surgeon: Melissa [name];  Location: [facility];  Service: Gastroenterology;  Laterality: N/A;   COLONOSCOPY WITH PROPOFOL N/A 03/15/2022   Procedure: COLONOSCOPY WITH PROPOFOL;  Surgeon: [name];  [name];  Service: Gastroenterology;  Laterality: N/A;   GASTRIC BYPASS     KNEE ARTHROPLASTY Right 10/18/2020   Procedure: COMPUTER ASSISTED TOTAL KNEE ARTHROPLASTY;  Surgeon: Melissa Heinz, [name];  Location: [facility];  Service: Orthopedics;  Laterality: Right;   LEFT HEART CATH AND CORONARY ANGIOGRAPHY N/A 01/20/2023   Procedure: LEFT HEART CATH AND CORONARY ANGIOGRAPHY;  Surgeon: Melissa Ouch, [name];  Location: [facility];  Service:  Cardiovascular;  Laterality: N/A;   SHOULDER ARTHROSCOPY Right    TUBAL LIGATION      Prior to Admission medications   Medication Sig Start Date End Date Taking? Authorizing Provider  acetaminophen (TYLENOL) 650 MG CR tablet Take 1,300 mg by mouth every 8 (eight) hours as needed for pain.    Provider, Historical, [name]  albuterol (VENTOLIN HFA) 108 (90 Base) MCG/ACT inhaler INHALE 1-2 PUFFS BY MOUTH EVERY 6 HOURS AS NEEDED FOR WHEEZE OR SHORTNESS OF BREATH 01/06/23   Melissa [name], Melissa Hefty, [name]  amLODipine (NORVASC) 10 MG tablet Take 5 mg by mouth daily. 03/07/23   Provider, Historical, [name]  Ascorbic Acid (VITAMIN C) 1000 MG tablet Take 1,000 mg by mouth daily.    Provider, Historical, [name]  aspirin EC 81 MG tablet Take 1 tablet by mouth daily. 08/27/18   Provider, Historical, [name]  atorvastatin (LIPITOR) 40 MG tablet Take 1 tablet (40 mg total) by mouth daily. 03/25/23   Melissa Cory, [name]  azelastine (ASTELIN) 0.1 % nasal spray Place 2 sprays into both nostrils 2 (two) times daily. Use in each nostril as directed 12/07/21   Melissa Ngo, NP  budesonide (PULMICORT) 0.5 MG/2ML nebulizer solution Take 2 mLs (0.5 mg total) by nebulization in the morning and at bedtime. 12/07/21   Melissa Ngo, NP  calcium carbonate (TUMS - DOSED IN MG ELEMENTAL CALCIUM) 500 MG chewable tablet Chew 500-1,000 mg by mouth daily as needed for indigestion or heartburn.    Provider, Historical, [name]  chlorhexidine (PERIDEX) 0.12 % solution PLEASE SEE ATTACHED FOR DETAILED DIRECTIONS 03/17/23   Provider, Historical, [name]  Cholecalciferol (D 1000) 25 MCG (1000 UT) capsule Take 1,000 Units by mouth daily.    Provider, Historical, [name]  ezetimibe (ZETIA) 10 MG tablet Take 1 tablet (10 mg total) by mouth daily. 03/25/23   Melissa Cory, [name]  ferrous sulfate 325 (65 FE) MG tablet Take 325 mg by mouth daily with breakfast.    Provider, Historical, [name]  fluticasone (FLONASE) 50 MCG/ACT nasal spray SPRAY 2 SPRAYS INTO EACH NOSTRIL EVERY DAY 01/15/23   Melissa Fulling, [name]  gabapentin (NEURONTIN) 100 MG capsule Take 100-200 mg by mouth 2 (two) times daily. One in am and two at night 11/19/22   Morene Crocker, [name]  ipratropium-albuterol (DUONEB) 0.5-2.5 (3) MG/3ML SOLN Take 3 mLs by nebulization every 6 (six) hours as needed. 12/07/21   Melissa Ngo, NP  levocetirizine (XYZAL ALLERGY 24HR) 5 MG tablet Take 1 tablet (5 mg total) by mouth every evening. Patient not taking: Reported on 04/14/2023 01/16/22   Melissa Fulling, [name]  losartan (COZAAR) 25 MG tablet Take 1 tablet (25 mg total) by mouth daily. 03/25/23   Melissa Cory, [name]  magnesium oxide (MAG-OX) 400 MG tablet Take 400 mg by mouth daily.    Provider, Historical, [name]  methocarbamol (ROBAXIN) 500 MG tablet Take 500 mg by  mouth every 8 (eight) hours as needed for muscle spasms. Patient not taking: Reported on 04/14/2023 12/02/22   Provider, Historical, [name]  Multiple Vitamins-Minerals (CENTRUM SILVER) tablet Take 1 tablet by mouth daily. 07/02/18   Melissa Cory, [name]  nortriptyline (PAMELOR) 10 MG capsule Take 10-20 mg by mouth at bedtime. 11/19/22   Morene Crocker, [name]  Probiotic Product (FORTIFY PROBIOTIC WOMENS EX ST PO) Take by mouth daily.    Provider, Historical, [name]  propranolol (INDERAL) 10 MG tablet Take 1 tablet (10 mg total) by mouth 3 (three) times daily as needed. For sustained fast heart rate 02/17/23   Antonieta Iba, [name]  Harrel Carina ELLIPTA 200-62.5-25 MCG/ACT AEPB INHALE ONE ACTUATION INTO THE LUNGS DAILY 11/04/22   Melissa Fulling, [name]  zinc gluconate 50 MG tablet Take 50 mg by mouth daily.    Provider, Historical, [name]    Family History  Problem Relation Age of Onset   Hypercholesterolemia Mother    Heart disease Mother    Hypertension Mother    Alcohol abuse Father    Lung cancer Brother    Alcohol abuse Brother    Diabetes Mellitus II Sister    Hypertension Maternal Grandmother    Colon cancer Sister    Breast cancer Neg Hx    Prostate cancer Neg Hx    Bladder Cancer Neg Hx    Kidney  cancer Neg Hx      Social History   Tobacco Use   Smoking status: Never   Smokeless tobacco: Never   Tobacco comments:    Second hand smoke exposure  Vaping Use   Vaping status: Never Used  Substance Use Topics   Alcohol use: Not Currently    Alcohol/week: 0.0 standard drinks of alcohol   Drug use: No    Allergies as of 05/19/2023 - Review Complete 05/19/2023  Allergen Reaction Noted   Shellfish allergy  04/17/2015    Review of Systems:    All systems reviewed and negative except where noted in HPI.   Physical Exam:  BP 116/71   Pulse 94   Temp 97.6 F (36.4 C)   Ht 6' (1.829 m)   Wt 209 lb 3.2 oz (94.9 kg)   BMI 28.37 kg/m  No LMP recorded. Patient is postmenopausal.  General:   Alert,  Well-developed, well-nourished, pleasant and cooperative in NAD Lungs:  Respirations even and unlabored.  Clear throughout to auscultation.   No wheezes, crackles, or rhonchi. No acute distress. Heart:  Regular rate and rhythm; no murmurs, clicks, rubs, or gallops. Abdomen:  Normal bowel sounds.  No bruits.  Soft, and non-distended without masses, hepatosplenomegaly or hernias noted.  Very mild RLQ and LLQ tenderness.  No upper abdominal tenderness.  No guarding or rebound tenderness.    Neurologic:  Alert and oriented x3;  grossly normal neurologically. Psych:  Alert and cooperative. Normal mood and affect.  Imaging Studies: No results found.  Assessment and Plan:   Melissa [name] is a 72 y.o. y/o female has been referred for:  1.  Chronic bilateral lower abdominal pain; suspect due to IBS-C.  Start IBgard (peppermint oil) 2 capsules twice daily, samples given.  2.  Chronic idiopathic constipation / IBS-C  Continue daily probiotic and magnesium supplement.  If she has recurrent constipation, then I recommend restart Linzess.  Patient declined prescription Linzess today as her constipation has improved.  3.  Chronic anemia; multifactorial due to iron and B12 deficiency  from previous gastric bypass.  Also Due to  chronic kidney disease.  Labs: CBC, iron panel, ferritin, B12, folate.  If labs show iron deficiency, then recommend EGD and celiac serology.  4.  Personal History of adenomatous colon polyps AND family history of colon cancer in Sister.  3-year repeat colonoscopy will be due 02/2025.  Follow up in 3 months with TG.  Also follow-up based on test results and GI symptoms.  Melissa Amy, [name]

## 2023-05-19 ENCOUNTER — Ambulatory Visit: Payer: Medicare Other

## 2023-05-19 ENCOUNTER — Encounter: Payer: Self-pay | Admitting: Physician Assistant

## 2023-05-19 ENCOUNTER — Ambulatory Visit (INDEPENDENT_AMBULATORY_CARE_PROVIDER_SITE_OTHER): Payer: Medicare Other | Admitting: Physician Assistant

## 2023-05-19 VITALS — BP 116/71 | HR 94 | Temp 97.6°F | Ht 72.0 in | Wt 209.2 lb

## 2023-05-19 DIAGNOSIS — R1084 Generalized abdominal pain: Secondary | ICD-10-CM | POA: Diagnosis not present

## 2023-05-19 DIAGNOSIS — N189 Chronic kidney disease, unspecified: Secondary | ICD-10-CM | POA: Diagnosis not present

## 2023-05-19 DIAGNOSIS — D631 Anemia in chronic kidney disease: Secondary | ICD-10-CM | POA: Diagnosis not present

## 2023-05-19 DIAGNOSIS — K581 Irritable bowel syndrome with constipation: Secondary | ICD-10-CM | POA: Diagnosis not present

## 2023-05-19 DIAGNOSIS — D649 Anemia, unspecified: Secondary | ICD-10-CM

## 2023-05-19 DIAGNOSIS — K5904 Chronic idiopathic constipation: Secondary | ICD-10-CM | POA: Diagnosis not present

## 2023-05-19 MED ORDER — IBGARD 90 MG PO CPCR
2.0000 | ORAL_CAPSULE | Freq: Two times a day (BID) | ORAL | Status: DC
Start: 2023-05-19 — End: 2023-07-31

## 2023-05-20 ENCOUNTER — Encounter: Payer: Medicare Other | Attending: Internal Medicine | Admitting: *Deleted

## 2023-05-20 DIAGNOSIS — D860 Sarcoidosis of lung: Secondary | ICD-10-CM | POA: Insufficient documentation

## 2023-05-20 NOTE — Progress Notes (Signed)
Daily Session Note  Patient Details  Name: [name] MRN: [id number] Date of Birth: 1950-11-25 Referring Provider:   Flowsheet Row Pulmonary Rehab from 04/28/2023 in [facility]  Referring Provider Dr. Belia Heman       Encounter Date: 05/20/2023  Check In:  Session Check In - 05/20/23 1139       Check-In   Supervising physician immediately available to respond to emergencies See telemetry face sheet for immediately available ER MD    Location [facility]    Staff Present Maxon Conetta BS, , Exercise Physiologist;Meredith Jewel Baize, RN BSN;Margaret Best, MS, Exercise Physiologist;Mertice Uffelman, RN, BSN, CCRP;Noah Tickle, BS, Exercise Physiologist    Virtual Visit No    Medication changes reported     No    Fall or balance concerns reported    No    Warm-up and Cool-down Performed on first and last piece of equipment    Resistance Training Performed Yes    VAD Patient? No    PAD/SET Patient? No      Pain Assessment   Currently in Pain? No/denies                Social History   Tobacco Use  Smoking Status Never  Smokeless Tobacco Never  Tobacco Comments   Second hand smoke exposure    Goals Met:  Proper associated with RPD/PD & O2 Sat Independence with exercise equipment Exercise tolerated well No report of concerns or symptoms today  Goals Unmet:  Not Applicable  Comments: Pt able to follow exercise prescription today without complaint.  Will continue to monitor for progression.    Dr. Bethann Punches is Medical Director for [facility] Card[facility].  Dr. Vida Rigger is Medical Director for [facility] Rehabilitation.

## 2023-05-21 ENCOUNTER — Encounter: Payer: Self-pay | Admitting: *Deleted

## 2023-05-21 ENCOUNTER — Ambulatory Visit: Payer: Medicare Other

## 2023-05-21 DIAGNOSIS — D860 Sarcoidosis of lung: Secondary | ICD-10-CM

## 2023-05-21 DIAGNOSIS — F411 Generalized anxiety disorder: Secondary | ICD-10-CM | POA: Diagnosis not present

## 2023-05-21 NOTE — Progress Notes (Signed)
Pulmonary Individual Treatment Plan  Patient Details  Name: Melissa [name] MRN: [id number] Date of Birth: 1951-03-27 Referring Provider:   Flowsheet Row Pulmonary Rehab from 04/28/2023 in [facility] Cardiac and Pulmonary Rehab  Referring Provider Dr. Belia Heman       Initial Encounter Date:  Flowsheet Row Pulmonary Rehab from 04/28/2023 in [facility] Cardiac and Pulmonary Rehab  Date 04/28/23       Visit Diagnosis: Sarcoidosis of lung (HCC)  Patient's Home Medications on Admission:  Current Outpatient Medications:    acetaminophen (TYLENOL) 650 MG CR tablet, Take 1,300 mg by mouth every 8 (eight) hours as needed for pain., Disp: , Rfl:    albuterol (VENTOLIN HFA) 108 (90 Base) MCG/ACT inhaler, INHALE 1-2 PUFFS BY MOUTH EVERY 6 HOURS AS NEEDED FOR WHEEZE OR SHORTNESS OF BREATH, Disp: 18 each, Rfl: 0   amLODipine (NORVASC) 10 MG tablet, Take 5 mg by mouth daily., Disp: , Rfl:    Ascorbic Acid (VITAMIN C) 1000 MG tablet, Take 1,000 mg by mouth daily., Disp: , Rfl:    aspirin EC 81 MG tablet, Take 1 tablet by mouth daily., Disp: , Rfl:    atorvastatin (LIPITOR) 40 MG tablet, Take 1 tablet (40 mg total) by mouth daily., Disp: 90 tablet, Rfl: 1   azelastine (ASTELIN) 0.1 % nasal spray, Place 2 sprays into both nostrils 2 (two) times daily. Use in each nostril as directed, Disp: 30 mL, Rfl: 12   budesonide (PULMICORT) 0.5 MG/2ML nebulizer solution, Take 2 mLs (0.5 mg total) by nebulization in the morning and at bedtime., Disp: 120 mL, Rfl: 6   calcium carbonate (TUMS - DOSED IN MG ELEMENTAL CALCIUM) 500 MG chewable tablet, Chew 500-1,000 mg by mouth daily as needed for indigestion or heartburn., Disp: , Rfl:    chlorhexidine (PERIDEX) 0.12 % solution, PLEASE SEE ATTACHED FOR DETAILED DIRECTIONS, Disp: , Rfl:    Cholecalciferol (D 1000) 25 MCG (1000 UT) capsule, Take 1,000 Units by mouth daily., Disp: , Rfl:    ezetimibe (ZETIA) 10 MG tablet, Take 1 tablet (10 mg total) by mouth daily., Disp: 90 tablet, Rfl:  1   ferrous sulfate 325 (65 FE) MG tablet, Take 325 mg by mouth daily with breakfast., Disp: , Rfl:    fluticasone (FLONASE) 50 MCG/ACT nasal spray, SPRAY 2 SPRAYS INTO EACH NOSTRIL EVERY DAY, Disp: 48 mL, Rfl: 1   gabapentin (NEURONTIN) 100 MG capsule, Take 100-200 mg by mouth 2 (two) times daily. One in am and two at night, Disp: , Rfl:    ipratropium-albuterol (DUONEB) 0.5-2.5 (3) MG/3ML SOLN, Take 3 mLs by nebulization every 6 (six) hours as needed., Disp: 360 mL, Rfl: 3   levocetirizine (XYZAL ALLERGY 24HR) 5 MG tablet, Take 1 tablet (5 mg total) by mouth every evening., Disp: 30 tablet, Rfl: 6   losartan (COZAAR) 25 MG tablet, Take 1 tablet (25 mg total) by mouth daily., Disp: 90 tablet, Rfl: 0   magnesium oxide (MAG-OX) 400 MG tablet, Take 400 mg by mouth daily., Disp: , Rfl:    methocarbamol (ROBAXIN) 500 MG tablet, Take 500 mg by mouth every 8 (eight) hours as needed for muscle spasms., Disp: , Rfl:    Multiple Vitamins-Minerals (CENTRUM SILVER) tablet, Take 1 tablet by mouth daily., Disp: 30 tablet, Rfl: 0   nortriptyline (PAMELOR) 10 MG capsule, Take 10-20 mg by mouth at bedtime., Disp: , Rfl:    Peppermint Oil (IBGARD) 90 MG CPCR, Take 2 capsules by mouth 2 (two) times daily., Disp: , Rfl:  Probiotic Product (FORTIFY PROBIOTIC WOMENS EX ST PO), Take by mouth daily., Disp: , Rfl:    propranolol (INDERAL) 10 MG tablet, Take 1 tablet (10 mg total) by mouth 3 (three) times daily as needed. For sustained fast heart rate, Disp: 60 tablet, Rfl: 3   TRELEGY ELLIPTA 200-62.5-25 MCG/ACT AEPB, INHALE ONE ACTUATION INTO THE LUNGS DAILY, Disp: 60 each, Rfl: 5   zinc gluconate 50 MG tablet, Take 50 mg by mouth daily., Disp: , Rfl:   Past Medical History: Past Medical History:  Diagnosis Date   Aortic atherosclerosis (HCC)    Asthma    Bruising    Cardiomyopathy (HCC)    Celiac artery aneurysm (HCC)    Cervical radiculopathy    CKD (chronic kidney disease), stage III (HCC)    Deficiency of  vitamin B    Depression    Edema    Flank pain    Gross hematuria    History of 2019 novel coronavirus disease (COVID-19) 05/01/2019   History of bariatric surgery    HLD (hyperlipidemia)    HTN (hypertension)    Interstitial lung disease (HCC)    Iron deficiency anemia    Leukopenia    OA (osteoarthritis)    Obesity    OSA on CPAP    Osteopenia    Perennial allergic rhinitis    Pneumonia due to COVID-19 virus 2020   Renal cysts, acquired, bilateral    RLS (restless legs syndrome)    Sarcoidosis    Sickle cell trait (HCC)    Snoring    Spinal stenosis    Trigeminal neuralgia     Tobacco Use: Social History   Tobacco Use  Smoking Status Never  Smokeless Tobacco Never  Tobacco Comments   Second hand smoke exposure    Labs: Review Flowsheet  More data exists      Latest Ref Rng & Units 10/18/2020 04/10/2021 04/10/2022 01/17/2023 01/18/2023  Labs for ITP Cardiac and Pulmonary Rehab  Cholestrol 0 - 200 mg/dL - 956  213  - 086   LDL (calc) 0 - 99 mg/dL - 94  57  - 54   HDL-C >40 mg/dL - 92  82  - 72   Trlycerides <150 mg/dL - 72  50  - 42   Hemoglobin A1c 4.8 - 5.6 % - - - 5.9  -  TCO2 22 - 32 mmol/L 25  - - - -    Details             Pulmonary Assessment Scores:  Pulmonary Assessment Scores     Row Name 04/28/23 1515         ADL UCSD   ADL Phase Entry     SOB Score total 14     Rest 0     Walk 0     Stairs 2     Bath 0     Dress 1     Shop 2       CAT Score   CAT Score 6       mMRC Score   mMRC Score 1              UCSD: Self-administered rating of dyspnea associated with activities of daily living (ADLs) 6-point scale (0 = "not at all" to 5 = "maximal or unable to do because of breathlessness")  Scoring Scores range from 0 to 120.  Minimally important difference is 5 units  CAT: CAT can identify the health impairment of COPD patients  and is better correlated with disease progression.  CAT has a scoring range of zero to 40. The CAT  score is classified into four groups of low (less than 10), medium (10 - 20), high (21-30) and very high (31-40) based on the impact level of disease on health status. A CAT score over 10 suggests significant symptoms.  A worsening CAT score could be explained by an exacerbation, poor medication adherence, poor inhaler technique, or progression of COPD or comorbid conditions.  CAT MCID is 2 points  mMRC: mMRC (Modified Medical Research Council) Dyspnea Scale is used to assess the degree of baseline functional disability in patients of respiratory disease due to dyspnea. No minimal important difference is established. A decrease in score of 1 point or greater is considered a positive change.   Pulmonary Function Assessment:  Pulmonary Function Assessment - 04/14/23 1018       Breath   Shortness of Breath No             Exercise Target Goals: Exercise Program Goal: Individual exercise prescription set using results from initial 6 min walk test and THRR while considering  patient's activity barriers and safety.   Exercise Prescription Goal: Initial exercise prescription builds to 30-45 minutes a day of aerobic activity, 2-3 days per week.  Home exercise guidelines will be given to patient during program as part of exercise prescription that the participant will acknowledge.  Education: Aerobic Exercise: - Group verbal and visual presentation on the components of exercise prescription. Introduces F.I.T.T principle from ACSM for exercise prescriptions.  Reviews F.I.T.T. principles of aerobic exercise including progression. Written material given at graduation. Flowsheet Row Pulmonary Rehab from 05/15/2023 in [facility] Cardiac and Pulmonary Rehab  Education need identified 04/28/23       Education: Resistance Exercise: - Group verbal and visual presentation on the components of exercise prescription. Introduces F.I.T.T principle from ACSM for exercise prescriptions  Reviews F.I.T.T.  principles of resistance exercise including progression. Written material given at graduation.    Education: Exercise & Equipment Safety: - Individual verbal instruction and demonstration of equipment use and safety with use of the equipment. Flowsheet Row Pulmonary Rehab from 05/15/2023 in [facility] Cardiac and Pulmonary Rehab  Date 04/28/23  Educator [facility]  Instruction Review Code 1- Verbalizes Understanding       Education: Exercise Physiology & General Exercise Guidelines: - Group verbal and written instruction with models to review the exercise physiology of the cardiovascular system and associated critical values. Provides general exercise guidelines with specific guidelines to those with heart or lung disease.  Flowsheet Row Pulmonary Rehab from 05/15/2023 in [facility] Cardiac and Pulmonary Rehab  Education need identified 04/28/23       Education: Flexibility, Balance, Mind/Body Relaxation: - Group verbal and visual presentation with interactive activity on the components of exercise prescription. Introduces F.I.T.T principle from ACSM for exercise prescriptions. Reviews F.I.T.T. principles of flexibility and balance exercise training including progression. Also discusses the mind body connection.  Reviews various relaxation techniques to help reduce and manage stress (i.e. Deep breathing, progressive muscle relaxation, and visualization). Balance handout provided to take home. Written material given at graduation.   Activity Barriers & Risk Stratification:  Activity Barriers & Cardiac Risk Stratification - 04/28/23 1502       Activity Barriers & Cardiac Risk Stratification   Activity Barriers Right Knee Replacement;Back Problems;Other (comment)    Comments left knee stiffness and some hip pain             6  Minute Walk:  6 Minute Walk     Row Name 04/28/23 1459         6 Minute Walk   Phase Initial     Distance 1290 feet     Walk Time 6 minutes     # of Rest Breaks 0      MPH 2.44     METS 3.45     RPE 13     Perceived Dyspnea  0     VO2 Peak 12.06     Symptoms No     Resting HR 93 bpm     Resting BP 122/72     Resting Oxygen Saturation  95 %     Exercise Oxygen Saturation  during 6 min walk 93 %     Max Ex. HR 130 bpm     Max Ex. BP 158/80     2 Minute Post BP 118/62       Interval HR   1 Minute HR 114     2 Minute HR 117     3 Minute HR 118     4 Minute HR 120     5 Minute HR 130     6 Minute HR 127     2 Minute Post HR 101     Interval Heart Rate? Yes       Interval Oxygen   Interval Oxygen? Yes     Baseline Oxygen Saturation % 95 %     1 Minute Oxygen Saturation % 98 %     1 Minute Liters of Oxygen 0 L     2 Minute Oxygen Saturation % 93 %     2 Minute Liters of Oxygen 0 L     3 Minute Oxygen Saturation % 94 %     3 Minute Liters of Oxygen 0 L     4 Minute Oxygen Saturation % 95 %     4 Minute Liters of Oxygen 0 L     5 Minute Oxygen Saturation % 93 %     5 Minute Liters of Oxygen 0 L     6 Minute Oxygen Saturation % 95 %     6 Minute Liters of Oxygen 0 L     2 Minute Post Oxygen Saturation % 94 %     2 Minute Post Liters of Oxygen 0 L             Oxygen Initial Assessment:  Oxygen Initial Assessment - 04/28/23 1514       Home Oxygen   Home Oxygen Device None    Sleep Oxygen Prescription CPAP    Liters per minute 0    Home Exercise Oxygen Prescription None    Home Resting Oxygen Prescription None    Compliance with Home Oxygen Use Yes      Initial 6 min Walk   Oxygen Used None      Program Oxygen Prescription   Program Oxygen Prescription None      Intervention   Short Term Goals To learn and exhibit compliance with exercise, home and travel O2 prescription;To learn and understand importance of monitoring SPO2 with pulse oximeter and demonstrate accurate use of the pulse oximeter.;To learn and understand importance of maintaining oxygen saturations>88%;To learn and demonstrate proper pursed lip breathing techniques  or other breathing techniques. ;To learn and demonstrate proper use of respiratory medications    Long  Term Goals Exhibits compliance with exercise, home  and travel O2 prescription;Maintenance of  O2 saturations>88%;Compliance with respiratory medication;Demonstrates proper use of MDI's;Exhibits proper breathing techniques, such as pursed lip breathing or other method taught during program session;Verbalizes importance of monitoring SPO2 with pulse oximeter and return demonstration             Oxygen Re-Evaluation:   Oxygen Discharge (Final Oxygen Re-Evaluation):   Initial Exercise Prescription:  Initial Exercise Prescription - 04/28/23 1500       Date of Initial Exercise RX and Referring Provider   Date 04/28/23    Referring Provider Dr. Belia Heman      Oxygen   Maintain Oxygen Saturation 88% or higher      Treadmill   MPH 3    Grade 0    Minutes 15    METs 3.5      NuStep   Level 2   T6   SPM 80    Minutes 15    METs 3.45      Elliptical   Level 1    Speed 2.5    Minutes 15    METs 3.45      REL-XR   Level 2    Speed 50    Minutes 15    METs 3.45      T5 Nustep   Level 2    SPM 80    Minutes 15    METs 3.45      Prescription Details   Frequency (times per week) 3    Duration Progress to 30 minutes of continuous aerobic without signs/symptoms of physical distress      Intensity   THRR 40-80% of Max Heartrate 115-138    Ratings of Perceived Exertion 11-13    Perceived Dyspnea 0-4      Progression   Progression Continue to progress workloads to maintain intensity without signs/symptoms of physical distress.      Resistance Training   Training Prescription Yes    Weight 4    Reps 10-15             Perform Capillary Blood Glucose checks as needed.  Exercise Prescription Changes:   Exercise Prescription Changes     Row Name 04/28/23 1500 05/08/23 1600           Response to Exercise   Blood Pressure (Admit) 122/72 112/60      Blood  Pressure (Exercise) 158/80 116/74      Blood Pressure (Exit) 118/62 110/60      Heart Rate (Admit) 93 bpm 112 bpm      Heart Rate (Exercise) 130 bpm 128 bpm      Heart Rate (Exit) 101 bpm 113 bpm      Oxygen Saturation (Admit) 95 % 95 %      Oxygen Saturation (Exercise) 93 % 90 %      Oxygen Saturation (Exit) 94 % 98 %      Rating of Perceived Exertion (Exercise) 13 13      Perceived Dyspnea (Exercise) 0 0      Symptoms none none      Comments 6 MWT results First week of exercise      Duration -- Progress to 30 minutes of  aerobic without signs/symptoms of physical distress      Intensity -- THRR unchanged        Progression   Progression -- Continue to progress workloads to maintain intensity without signs/symptoms of physical distress.      Average METs -- 3.72        Resistance Training  Training Prescription -- Yes      Weight -- 4      Reps -- 10-15        Interval Training   Interval Training -- No        Treadmill   MPH -- 2.8      Grade -- 1.5      Minutes -- 15      METs -- 3.72        NuStep   Level -- 5      Minutes -- 15      METs -- 4.2        T5 Nustep   Level -- 2      Minutes -- 15        Oxygen   Maintain Oxygen Saturation -- 88% or higher               Exercise Comments:   Exercise Goals and Review:   Exercise Goals     Row Name 04/28/23 1509             Exercise Goals   Increase Physical Activity Yes       Intervention Provide advice, education, support and counseling about physical activity/exercise needs.;Develop an individualized exercise prescription for aerobic and resistive training based on initial evaluation findings, risk stratification, comorbidities and participant's personal goals.       Expected Outcomes Short Term: Attend rehab on a regular basis to increase amount of physical activity.;Long Term: Add in home exercise to make exercise part of routine and to increase amount of physical activity.;Long Term: Exercising  regularly at least 3-5 days a week.       Increase Strength and Stamina Yes       Intervention Provide advice, education, support and counseling about physical activity/exercise needs.;Develop an individualized exercise prescription for aerobic and resistive training based on initial evaluation findings, risk stratification, comorbidities and participant's personal goals.       Expected Outcomes Short Term: Increase workloads from initial exercise prescription for resistance, speed, and METs.;Short Term: Perform resistance training exercises routinely during rehab and add in resistance training at home;Long Term: Improve cardiorespiratory fitness, muscular endurance and strength as measured by increased METs and functional capacity ( )       Able to understand and use rate of perceived exertion (RPE) scale Yes       Intervention Provide education and explanation on how to use RPE scale       Expected Outcomes Short Term: Able to use RPE daily in rehab to express subjective intensity level;Long Term:  Able to use RPE to guide intensity level when exercising independently       Able to understand and use Dyspnea scale Yes       Intervention Provide education and explanation on how to use Dyspnea scale       Expected Outcomes Short Term: Able to use Dyspnea scale daily in rehab to express subjective sense of shortness of breath during exertion;Long Term: Able to use Dyspnea scale to guide intensity level when exercising independently       Knowledge and understanding of Target Heart Rate Range (THRR) Yes       Intervention Provide education and explanation of THRR including how the numbers were predicted and where they are located for reference       Expected Outcomes Short Term: Able to state/look up THRR;Long Term: Able to use THRR to govern intensity when exercising independently;Short Term: Able to use daily as  guideline for intensity in rehab       Able to check pulse independently Yes        Intervention Provide education and demonstration on how to check pulse in carotid and radial arteries.;Review the importance of being able to check your own pulse for safety during independent exercise       Expected Outcomes Short Term: Able to explain why pulse checking is important during independent exercise;Long Term: Able to check pulse independently and accurately       Understanding of Exercise Prescription Yes       Intervention Provide education, explanation, and written materials on patient's individual exercise prescription       Expected Outcomes Short Term: Able to explain program exercise prescription;Long Term: Able to explain home exercise prescription to exercise independently                Exercise Goals Re-Evaluation :  Exercise Goals Re-Evaluation     Row Name 05/08/23 1647             Exercise Goal Re-Evaluation   Exercise Goals Review Increase Physical Activity;Increase Strength and Stamina;Understanding of Exercise Prescription       Comments Leilanii is off to a good start in the program. She attended 2 sessions during this review, and was able to use the treadmill, T4, and T5 nustep. We will continue to monitor her progress in the program.       Expected Outcomes Short: Continue to follow current exercise prescription. Long: Continue exercise to improve strength and stamina.                Discharge Exercise Prescription (Final Exercise Prescription Changes):  Exercise Prescription Changes - 05/08/23 1600       Response to Exercise   Blood Pressure (Admit) 112/60    Blood Pressure (Exercise) 116/74    Blood Pressure (Exit) 110/60    Heart Rate (Admit) 112 bpm    Heart Rate (Exercise) 128 bpm    Heart Rate (Exit) 113 bpm    Oxygen Saturation (Admit) 95 %    Oxygen Saturation (Exercise) 90 %    Oxygen Saturation (Exit) 98 %    Rating of Perceived Exertion (Exercise) 13    Perceived Dyspnea (Exercise) 0    Symptoms none    Comments First week  of exercise    Duration Progress to 30 minutes of  aerobic without signs/symptoms of physical distress    Intensity THRR unchanged      Progression   Progression Continue to progress workloads to maintain intensity without signs/symptoms of physical distress.    Average METs 3.72      Resistance Training   Training Prescription Yes    Weight 4    Reps 10-15      Interval Training   Interval Training No      Treadmill   MPH 2.8    Grade 1.5    Minutes 15    METs 3.72      NuStep   Level 5    Minutes 15    METs 4.2      T5 Nustep   Level 2    Minutes 15      Oxygen   Maintain Oxygen Saturation 88% or higher             Nutrition:  Target Goals: Understanding of nutrition guidelines, daily intake of sodium 1500mg , cholesterol 200mg , calories 30% from fat and 7% or less from saturated fats, daily to  have 5 or more servings of fruits and vegetables.  Education: All About Nutrition: -Group instruction provided by verbal, written material, interactive activities, discussions, models, and posters to present general guidelines for heart healthy nutrition including fat, fiber, MyPlate, the role of sodium in heart healthy nutrition, utilization of the nutrition label, and utilization of this knowledge for meal planning. Follow up email sent as well. Written material given at graduation. Flowsheet Row Pulmonary Rehab from 05/15/2023 in [facility] Cardiac and Pulmonary Rehab  Education need identified 04/28/23       Biometrics:  Pre Biometrics - 04/28/23 1511       Pre Biometrics   Height 6' (1.829 m)    Weight 210 lb (95.3 kg)    Waist Circumference 36 inches    Hip Circumference 47 inches    Waist to Hip Ratio 0.77 %    BMI (Calculated) 28.47    Single Leg Stand 9.43 seconds              Nutrition Therapy Plan and Nutrition Goals:  Nutrition Therapy & Goals - 04/28/23 1511       Nutrition Therapy   Diet Cardiac, Low Na    Protein (specify units) 90     Fiber 25 grams    Whole Grain Foods 3 servings    Saturated Fats 15 max. grams    Fruits and Vegetables 5 servings/day    Sodium 2 grams      Personal Nutrition Goals   Nutrition Goal Eat a small but nutrient dense breakfast most days the week    Personal Goal #2 Eat a protein and carb at each meal    Personal Goal #3 Cut back on sweets, look for healthier snacks    Comments Patient drinking water and coffee only. She reports she often skips lunch due to eating breakfast late, around 11am. She reports she wakes up early and goes to water aerobics without eating breakfast. Brainstormed things to make breakfast more achievable for her, focus was on smaller portions, quick to make, grab and go friendly. Educated on what makes a good meal and snack. Set goal to pair protein and carbs together at each meal. She finds herself snacking on sweets often, talked about better snacks to try more often, reducing the frequency of these sugary foods. Reviewed mediterranean diet handout, types of fats, sources, how to read label. Built out several meals and snacks with foods she likes and will eat. Focus on smaller portions, healthy fats and reducing sugar intake from poor food choices. She is very optimistic about making changes to her eating structure and will work on consistency.      Intervention Plan   Intervention Prescribe, educate and counsel regarding individualized specific dietary modifications aiming towards targeted core components such as weight, hypertension, lipid management, diabetes, heart failure and other comorbidities.;Nutrition handout(s) given to patient.    Expected Outcomes Long Term Goal: Adherence to prescribed nutrition plan.;Short Term Goal: A plan has been developed with personal nutrition goals set during dietitian appointment.;Short Term Goal: Understand basic principles of dietary content, such as calories, fat, sodium, cholesterol and nutrients.             Nutrition  Assessments:  MEDIFICTS Score Key: >=70 Need to make dietary changes  40-70 Heart Healthy Diet <= 40 Therapeutic Level Cholesterol Diet  Flowsheet Row Pulmonary Rehab from 04/28/2023 in [facility] Cardiac and Pulmonary Rehab  Picture Your Plate Total Score on Admission 48      Picture Your  Plate Scores: <46 Unhealthy dietary pattern with much room for improvement. 41-50 Dietary pattern unlikely to meet recommendations for good health and room for improvement. 51-60 More healthful dietary pattern, with some room for improvement.  >60 Healthy dietary pattern, although there may be some specific behaviors that could be improved.   Nutrition Goals Re-Evaluation:   Nutrition Goals Discharge (Final Nutrition Goals Re-Evaluation):   Psychosocial: Target Goals: Acknowledge presence or absence of significant depression and/or stress, maximize coping skills, provide positive support system. Participant is able to verbalize types and ability to use techniques and skills needed for reducing stress and depression.   Education: Stress, Anxiety, and Depression - Group verbal and visual presentation to define topics covered.  Reviews how body is impacted by stress, anxiety, and depression.  Also discusses healthy ways to reduce stress and to treat/manage anxiety and depression.  Written material given at graduation.   Education: Sleep Hygiene -Provides group verbal and written instruction about how sleep can affect your health.  Define sleep hygiene, discuss sleep cycles and impact of sleep habits. Review good sleep hygiene tips.    Initial Review & Psychosocial Screening:  Initial Psych Review & Screening - 04/14/23 1020       Initial Review   Current issues with None Identified;Current Anxiety/Panic;History of Depression      Family Dynamics   Good Support System? Yes    Comments SHe has a good support system. She can look to her sons for support and her youngest son lives with her. Sometiems  she has anxiet over the normal stresses of life but nothing significant.      Barriers   Psychosocial barriers to participate in program The patient should benefit from training in stress management and relaxation.;There are no identifiable barriers or psychosocial needs.      Screening Interventions   Interventions Encouraged to exercise;To provide support and resources with identified psychosocial needs;Provide feedback about the scores to participant    Expected Outcomes Short Term goal: Utilizing psychosocial counselor, staff and physician to assist with identification of specific Stressors or current issues interfering with healing process. Setting desired goal for each stressor or current issue identified.;Long Term Goal: Stressors or current issues are controlled or eliminated.;Short Term goal: Identification and review with participant of any Quality of Life or Depression concerns found by scoring the questionnaire.;Long Term goal: The participant improves quality of Life and PHQ9 Scores as seen by post scores and/or verbalization of changes             Quality of Life Scores:  Scores of 19 and below usually indicate a poorer quality of life in these areas.  A difference of  2-3 points is a clinically meaningful difference.  A difference of 2-3 points in the total score of the Quality of Life Index has been associated with significant improvement in overall quality of life, self-image, physical symptoms, and general health in studies assessing change in quality of life.  PHQ-9: Review Flowsheet  More data exists      04/28/2023 03/25/2023 03/14/2023 11/19/2022 10/31/2022  Depression screen PHQ 2/9  Decreased Interest 0 0 0 0 0  Down, Depressed, Hopeless 0 0 0 0 0  PHQ - 2 Score 0 0 0 0 0  Altered sleeping 0 0 0 0 0  Tired, decreased energy 1 0 0 1 1  Change in appetite 0 0 0 0 0  Feeling bad or failure about yourself  0 0 0 0 0  Trouble concentrating 1  0 0 0 0  Moving slowly or  fidgety/restless 0 0 0 0 0  Suicidal thoughts 0 0 0 0 0  PHQ-9 Score 2 0 0 1 1  Difficult doing work/chores Not difficult at all - - - -    Details           Interpretation of Total Score  Total Score Depression Severity:  1-4 = Minimal depression, 5-9 = Mild depression, 10-14 = Moderate depression, 15-19 = Moderately severe depression, 20-27 = Severe depression   Psychosocial Evaluation and Intervention:  Psychosocial Evaluation - 04/14/23 1022       Psychosocial Evaluation & Interventions   Interventions Encouraged to exercise with the program and follow exercise prescription;Relaxation education;Stress management education    Comments SHe has a good support system. She can look to her sons for support and her youngest son lives with her. Sometiems she has anxiet over the normal stresses of life but nothing significant.    Expected Outcomes Short: Start [facility] to help with mood. Long: Maintain a healthy mental state.    Continue Psychosocial Services  Follow up required by staff             Psychosocial Re-Evaluation:   Psychosocial Discharge (Final Psychosocial Re-Evaluation):   Education: Education Goals: Education classes will be provided on a weekly basis, covering required topics. Participant will state understanding/return demonstration of topics presented.  Learning Barriers/Preferences:  Learning Barriers/Preferences - 04/14/23 1018       Learning Barriers/Preferences   Learning Barriers None    Learning Preferences None             General Pulmonary Education Topics:  Infection Prevention: - Provides verbal and written material to individual with discussion of infection control including proper hand washing and proper equipment cleaning during exercise session. Flowsheet Row Pulmonary Rehab from 05/15/2023 in [facility] Cardiac and Pulmonary Rehab  Date 04/28/23  Educator [facility]  Instruction Review Code 1- Verbalizes Understanding       Falls  Prevention: - Provides verbal and written material to individual with discussion of falls prevention and safety. Flowsheet Row Pulmonary Rehab from 05/15/2023 in [facility] Cardiac and Pulmonary Rehab  Date 04/28/23  Educator [facility]  Instruction Review Code 1- Verbalizes Understanding       Chronic Lung Disease Review: - Group verbal instruction with posters, models, PowerPoint presentations and videos,  to review new updates, new respiratory medications, new advancements in procedures and treatments. Providing information on websites and "800" numbers for continued self-education. Includes information about supplement oxygen, available portable oxygen systems, continuous and intermittent flow rates, oxygen safety, concentrators, and Medicare reimbursement for oxygen. Explanation of Pulmonary Drugs, including class, frequency, complications, importance of spacers, rinsing mouth after steroid MDI's, and proper cleaning methods for nebulizers. Review of basic lung anatomy and physiology related to function, structure, and complications of lung disease. Review of risk factors. Discussion about methods for diagnosing sleep apnea and types of masks and machines for OSA. Includes a review of the use of types of environmental controls: home humidity, furnaces, filters, dust mite/pet prevention, HEPA vacuums. Discussion about weather changes, air quality and the benefits of nasal washing. Instruction on Warning signs, infection symptoms, calling MD promptly, preventive modes, and value of vaccinations. Review of effective airway clearance, coughing and/or vibration techniques. Emphasizing that all should Create an Action Plan. Written material given at graduation. Flowsheet Row Pulmonary Rehab from 05/15/2023 in [facility] Cardiac and Pulmonary Rehab  Education need identified 04/28/23  Date 05/15/23  Educator  [facility]  Instruction Review Code 1- Verbalizes Understanding       AED/CPR: - Group verbal and written instruction  with the use of models to demonstrate the basic use of the AED with the basic ABC's of resuscitation.    Anatomy and Cardiac Procedures: - Group verbal and visual presentation and models provide information about basic cardiac anatomy and function. Reviews the testing methods done to diagnose heart disease and the outcomes of the test results. Describes the treatment choices: Medical Management, Angioplasty, or Coronary Bypass Surgery for treating various heart conditions including Myocardial Infarction, Angina, Valve Disease, and Cardiac Arrhythmias.  Written material given at graduation.   Medication Safety: - Group verbal and visual instruction to review commonly prescribed medications for heart and lung disease. Reviews the medication, class of the drug, and side effects. Includes the steps to properly store meds and maintain the prescription regimen.  Written material given at graduation.   Other: -Provides group and verbal instruction on various topics (see comments)   Knowledge Questionnaire Score:  Knowledge Questionnaire Score - 04/28/23 1513       Knowledge Questionnaire Score   Pre Score 11/18              Core Components/Risk Factors/Patient Goals at Admission:  Personal Goals and Risk Factors at Admission - 04/28/23 1512       Core Components/Risk Factors/Patient Goals on Admission    Weight Management Yes;Weight Loss    Intervention Weight Management/Obesity: Establish reasonable short term and long term weight goals.;Weight Management: Provide education and appropriate resources to help participant work on and attain dietary goals.;Weight Management: Develop a combined nutrition and exercise program designed to reach desired caloric intake, while maintaining appropriate intake of nutrient and fiber, sodium and fats, and appropriate energy expenditure required for the weight goal.    Admit Weight 210 lb (95.3 kg)    Goal Weight: Short Term 200 lb (90.7 kg)    Goal  Weight: Long Term 180 lb (81.6 kg)    Expected Outcomes Short Term: Continue to assess and modify interventions until short term weight is achieved;Long Term: Adherence to nutrition and physical activity/exercise program aimed toward attainment of established weight goal;Weight Loss: Understanding of general recommendations for a balanced deficit meal plan, which promotes 1-2 lb weight loss per week and includes a negative energy balance of [phone] kcal/d;Understanding recommendations for meals to include 15-35% energy as protein, 25-35% energy from fat, 35-60% energy from carbohydrates, less than 200mg  of dietary cholesterol, 20-35 gm of total fiber daily;Understanding of distribution of calorie intake throughout the day with the consumption of 4-5 meals/snacks    Improve shortness of breath with ADL's Yes    Intervention Provide education, individualized exercise plan and daily activity instruction to help decrease symptoms of SOB with activities of daily living.    Expected Outcomes Short Term: Improve cardiorespiratory fitness to achieve a reduction of symptoms when performing ADLs;Long Term: Be able to perform more ADLs without symptoms or delay the onset of symptoms    Hypertension Yes    Intervention Provide education on lifestyle modifcations including regular physical activity/exercise, weight management, moderate sodium restriction and increased consumption of fresh fruit, vegetables, and low fat dairy, alcohol moderation, and smoking cessation.;Monitor prescription use compliance.    Expected Outcomes Short Term: Continued assessment and intervention until BP is < 140/65mm HG in hypertensive participants. < 130/51mm HG in hypertensive participants with diabetes, heart failure or chronic kidney disease.;Long Term: Maintenance of blood pressure at goal levels.  Lipids Yes    Intervention Provide education and support for participant on nutrition & aerobic/resistive exercise along with prescribed  medications to achieve LDL 70mg , HDL >40mg .    Expected Outcomes Short Term: Participant states understanding of desired cholesterol values and is compliant with medications prescribed. Participant is following exercise prescription and nutrition guidelines.;Long Term: Cholesterol controlled with medications as prescribed, with individualized exercise RX and with personalized nutrition plan. Value goals: LDL < 70mg , HDL > 40 mg.             Education:Diabetes - Individual verbal and written instruction to review signs/symptoms of diabetes, desired ranges of glucose level fasting, after meals and with exercise. Acknowledge that pre and post exercise glucose checks will be done for 3 sessions at entry of program.   Know Your Numbers and Heart Failure: - Group verbal and visual instruction to discuss disease risk factors for cardiac and pulmonary disease and treatment options.  Reviews associated critical values for Overweight/Obesity, Hypertension, Cholesterol, and Diabetes.  Discusses basics of heart failure: signs/symptoms and treatments.  Introduces Heart Failure Zone chart for action plan for heart failure.  Written material given at graduation. Flowsheet Row Pulmonary Rehab from 05/15/2023 in [facility] Cardiac and Pulmonary Rehab  Education need identified 04/28/23       Core Components/Risk Factors/Patient Goals Review:    Core Components/Risk Factors/Patient Goals at Discharge (Final Review):    ITP Comments:  ITP Comments     Row Name 04/14/23 1016 04/28/23 1458 05/21/23 0952       ITP Comments Virtual Visit completed. Patient informed on EP and RD appointment and 6 Minute walk test. Patient also informed of patient health questionnaires on My Chart. Patient Verbalizes understanding. Visit diagnosis can be found in [facility] 09/30/2022. Completed and gym orientation. Initial ITP created and sent for review to Dr. Jinny Sanders, Medical Director. 30 Day review completed. Medical  Director ITP review done, changes made as directed, and signed approval by Medical Director.    new to program              Comments:

## 2023-05-22 ENCOUNTER — Encounter: Payer: Medicare Other | Admitting: *Deleted

## 2023-05-22 DIAGNOSIS — D860 Sarcoidosis of lung: Secondary | ICD-10-CM | POA: Diagnosis not present

## 2023-05-22 NOTE — Progress Notes (Signed)
Daily Session Note  Patient Details  Name: VERTA RIEDLINGER MRN: [id number] Date of Birth: 01-26-1951 Referring Provider:   Flowsheet Row Pulmonary Rehab from 04/28/2023 in [facility] Cardiac and Pulmonary Rehab  Referring Provider Dr. Belia Heman       Encounter Date: 05/22/2023  Check In:  Session Check In - 05/22/23 1115       Check-In   Supervising physician immediately available to respond to emergencies See telemetry face sheet for immediately available ER MD    Location [facility]-Cardiac & Pulmonary Rehab    Staff Present Ronette Deter, BS, Exercise Physiologist;Meredith Jewel Baize, RN BSN;Joseph Shelbie Proctor, RN, [place]    Virtual Visit No    Medication changes reported     No    Fall or balance concerns reported    No    Warm-up and Cool-down Performed on first and last piece of equipment    Resistance Training Performed Yes    VAD Patient? No    PAD/SET Patient? No      Pain Assessment   Currently in Pain? No/denies                Social History   Tobacco Use  Smoking Status Never  Smokeless Tobacco Never  Tobacco Comments   Second hand smoke exposure    Goals Met:  Independence with exercise equipment Exercise tolerated well No report of concerns or symptoms today Strength training completed today  Goals Unmet:  Not Applicable  Comments: Pt able to follow exercise prescription today without complaint.  Will continue to monitor for progression.    Dr. Bethann Punches is Medical Director for [facility] Cardiac Rehabilitation.  Dr. Vida Rigger is Medical Director for [facility] Pulmonary Rehabilitation.

## 2023-05-23 ENCOUNTER — Ambulatory Visit: Payer: Medicare Other

## 2023-05-26 ENCOUNTER — Ambulatory Visit: Payer: Medicare Other

## 2023-05-26 DIAGNOSIS — D649 Anemia, unspecified: Secondary | ICD-10-CM | POA: Diagnosis not present

## 2023-05-27 ENCOUNTER — Encounter: Payer: Medicare Other | Admitting: *Deleted

## 2023-05-27 DIAGNOSIS — D860 Sarcoidosis of lung: Secondary | ICD-10-CM | POA: Diagnosis not present

## 2023-05-27 LAB — CBC WITH DIFFERENTIAL/PLATELET
Basophils Absolute: 0.1 10*3/uL (ref 0.0–0.2)
Basos: 1 %
EOS (ABSOLUTE): 0.1 10*3/uL (ref 0.0–0.4)
Eos: 2 %
Hematocrit: 38.9 % (ref 34.0–46.6)
Hemoglobin: 12.4 g/dL (ref 11.1–15.9)
Immature Grans (Abs): 0 10*3/uL (ref 0.0–0.1)
Immature Granulocytes: 0 %
Lymphocytes Absolute: 2.2 10*3/uL (ref 0.7–3.1)
Lymphs: 40 %
MCH: 29.9 pg (ref 26.6–33.0)
MCHC: 31.9 g/dL (ref 31.5–35.7)
MCV: 94 fL (ref 79–97)
Monocytes Absolute: 0.5 10*3/uL (ref 0.1–0.9)
Monocytes: 8 %
Neutrophils Absolute: 2.7 10*3/uL (ref 1.4–7.0)
Neutrophils: 49 %
Platelets: 216 10*3/uL (ref 150–450)
RBC: 4.15 x10E6/uL (ref 3.77–5.28)
RDW: 12.3 % (ref 11.7–15.4)
WBC: 5.6 10*3/uL (ref 3.4–10.8)

## 2023-05-27 LAB — B12 AND FOLATE PANEL
Folate: >20 ng/mL (ref >3.0)
Vitamin B-12: >2000 pg/mL — ABNORMAL HIGH (ref 232–1245)

## 2023-05-27 LAB — IRON,TIBC AND FERRITIN PANEL
Ferritin: 114 ng/mL (ref 15–150)
Iron Saturation: 34 % (ref 15–55)
Iron: 94 ug/dL (ref 27–139)
Total Iron Binding Capacity: 274 ug/dL (ref 250–450)
UIBC: 180 ug/dL (ref 118–369)

## 2023-05-27 NOTE — Progress Notes (Signed)
Daily Session Note  Patient Details  Name: Melissa [name] MRN: [id number] Date of Birth: 1950/11/01 Referring Provider:   Flowsheet Row Pulmonary Rehab from 04/28/2023 in [facility] Cardiac and Pulmonary Rehab  Referring Provider Dr. Belia Heman       Encounter Date: 05/27/2023  Check In:  Session Check In - 05/27/23 1130       Check-In   Supervising physician immediately available to respond to emergencies See telemetry face sheet for immediately available ER MD    Location [facility]-Cardiac & Pulmonary Rehab    Staff Present Susann Givens, RN BSN;Maxon Conetta BS, , Exercise Physiologist;Jason Wallace Cullens, RDN, LDN;Parissa Chiao, RN, BSN, CCRP    Virtual Visit No    Medication changes reported     No    Fall or balance concerns reported    No    Warm-up and Cool-down Performed on first and last piece of equipment    Resistance Training Performed Yes    VAD Patient? No    PAD/SET Patient? No      Pain Assessment   Currently in Pain? No/denies                Social History   Tobacco Use  Smoking Status Never  Smokeless Tobacco Never  Tobacco Comments   Second hand smoke exposure    Goals Met:  Proper associated with RPD/PD & O2 Sat Independence with exercise equipment Exercise tolerated well No report of concerns or symptoms today  Goals Unmet:  Not Applicable  Comments: Pt able to follow exercise prescription today without complaint.  Will continue to monitor for progression.    Dr. Bethann Punches is Medical Director for [facility] Cardiac Rehabilitation.  Dr. Vida Rigger is Medical Director for [facility] Pulmonary Rehabilitation.

## 2023-05-28 ENCOUNTER — Ambulatory Visit: Payer: Medicare Other

## 2023-05-29 ENCOUNTER — Ambulatory Visit: Payer: Medicare Other | Admitting: Nurse Practitioner

## 2023-05-29 ENCOUNTER — Encounter: Payer: Medicare Other | Admitting: *Deleted

## 2023-05-29 DIAGNOSIS — D860 Sarcoidosis of lung: Secondary | ICD-10-CM

## 2023-05-29 NOTE — Progress Notes (Signed)
Daily Session Note  Patient Details  Name: PHOUA HOADLEY MRN: [id number] Date of Birth: Feb 24, 1951 Referring Provider:   Flowsheet Row Pulmonary Rehab from 04/28/2023 in [facility] Cardiac and Pulmonary Rehab  Referring Provider Dr. Belia Heman       Encounter Date: 05/29/2023  Check In:  Session Check In - 05/29/23 1052       Check-In   Supervising physician immediately available to respond to emergencies See telemetry face sheet for immediately available ER MD    Location [facility]-Cardiac & Pulmonary Rehab    Staff Present Ronette Deter, BS, Exercise Physiologist;Meredith Jewel Baize, RN Atilano Median, RN, Bobetta Lime, RCP,RRT,BSRT    Virtual Visit No    Medication changes reported     No    Fall or balance concerns reported    No    Warm-up and Cool-down Performed on first and last piece of equipment    Resistance Training Performed Yes    VAD Patient? No    PAD/SET Patient? No      Pain Assessment   Currently in Pain? No/denies                Social History   Tobacco Use  Smoking Status Never  Smokeless Tobacco Never  Tobacco Comments   Second hand smoke exposure    Goals Met:  Independence with exercise equipment Exercise tolerated well No report of concerns or symptoms today Strength training completed today  Goals Unmet:  Not Applicable  Comments: Pt able to follow exercise prescription today without complaint.  Will continue to monitor for progression.    Dr. Bethann Punches is Medical Director for [facility] Cardiac Rehabilitation.  Dr. Vida Rigger is Medical Director for [facility] Pulmonary Rehabilitation.

## 2023-05-30 ENCOUNTER — Ambulatory Visit: Payer: Medicare Other

## 2023-06-02 ENCOUNTER — Telehealth: Payer: Self-pay

## 2023-06-02 ENCOUNTER — Ambulatory Visit: Payer: Medicare Other

## 2023-06-02 NOTE — Telephone Encounter (Signed)
Left detailed message on VM.  Hi Ms. Feng, all your labs look great!  Hemoglobin has improved from 11.6 up to 12.4.  Within normal range.  Your iron and folate are normal.  Vitamin B12 is a little high, yet not harmful.  You can stop taking B12 supplement or take it twice per week instead of every day. [name], [name]  Written by [name], [name] on 05/27/2023  4:00 PM EDT

## 2023-06-03 ENCOUNTER — Encounter: Payer: Medicare Other | Admitting: *Deleted

## 2023-06-03 DIAGNOSIS — D860 Sarcoidosis of lung: Secondary | ICD-10-CM | POA: Diagnosis not present

## 2023-06-03 NOTE — Progress Notes (Signed)
Daily Session Note  Patient Details  Name: Melissa [name] MRN: [id number] Date of Birth: 1951-01-20 Referring Provider:   Flowsheet Row Pulmonary Rehab from 04/28/2023 in [facility] Cardiac and Pulmonary Rehab  Referring Provider Dr. Belia Heman       Encounter Date: 06/03/2023  Check In:  Session Check In - 06/03/23 1130       Check-In   Supervising physician immediately available to respond to emergencies See telemetry face sheet for immediately available ER MD    Location [facility]-Cardiac & Pulmonary Rehab    Staff Present Cora Collum, RN, BSN, CCRP;Jason Wallace Cullens, RDN, LDN;Meredith Jewel Baize, RN BSN;Maxon Conetta BS, , Exercise Physiologist    Virtual Visit No    Medication changes reported     No    Fall or balance concerns reported    No    Warm-up and Cool-down Performed on first and last piece of equipment    Resistance Training Performed Yes    VAD Patient? No    PAD/SET Patient? No      Pain Assessment   Currently in Pain? No/denies                Social History   Tobacco Use  Smoking Status Never  Smokeless Tobacco Never  Tobacco Comments   Second hand smoke exposure    Goals Met:  Proper associated with RPD/PD & O2 Sat Independence with exercise equipment Exercise tolerated well No report of concerns or symptoms today  Goals Unmet:  Not Applicable  Comments: Pt able to follow exercise prescription today without complaint.  Will continue to monitor for progression.    Dr. Bethann Punches is Medical Director for [facility] Cardiac Rehabilitation.  Dr. Vida Rigger is Medical Director for [facility] Pulmonary Rehabilitation.

## 2023-06-04 ENCOUNTER — Ambulatory Visit: Payer: Medicare Other

## 2023-06-04 DIAGNOSIS — F411 Generalized anxiety disorder: Secondary | ICD-10-CM | POA: Diagnosis not present

## 2023-06-05 ENCOUNTER — Encounter: Payer: Medicare Other | Admitting: *Deleted

## 2023-06-05 DIAGNOSIS — D860 Sarcoidosis of lung: Secondary | ICD-10-CM | POA: Diagnosis not present

## 2023-06-05 NOTE — Progress Notes (Signed)
Daily Session Note  Patient Details  Name: Melissa [name] MRN: [id number] Date of Birth: 12-Jul-1951 Referring Provider:   Flowsheet Row Pulmonary Rehab from 04/28/2023 in [facility] Cardiac and Pulmonary Rehab  Referring Provider Dr. Belia Heman       Encounter Date: 06/05/2023  Check In:  Session Check In - 06/05/23 1107       Check-In   Supervising physician immediately available to respond to emergencies See telemetry face sheet for immediately available ER MD    Location [facility]-Cardiac & Pulmonary Rehab    Staff Present Elige Ko, RCP,RRT,BSRT;Meredith Jewel Baize, RN BSN;Maxon Conetta BS, , Exercise Physiologist;Willma Obando Katrinka Blazing, RN, ADN    Virtual Visit No    Medication changes reported     No    Fall or balance concerns reported    No    Warm-up and Cool-down Performed on first and last piece of equipment    Resistance Training Performed Yes    VAD Patient? No    PAD/SET Patient? No      Pain Assessment   Currently in Pain? No/denies                Social History   Tobacco Use  Smoking Status Never  Smokeless Tobacco Never  Tobacco Comments   Second hand smoke exposure    Goals Met:  Independence with exercise equipment Exercise tolerated well No report of concerns or symptoms today Strength training completed today  Goals Unmet:  Not Applicable  Comments: Pt able to follow exercise prescription today without complaint.  Will continue to monitor for progression.    Dr. Bethann Punches is Medical Director for [facility] Cardiac Rehabilitation.  Dr. Vida Rigger is Medical Director for [facility] Pulmonary Rehabilitation.

## 2023-06-06 ENCOUNTER — Ambulatory Visit: Payer: Medicare Other

## 2023-06-06 ENCOUNTER — Encounter (INDEPENDENT_AMBULATORY_CARE_PROVIDER_SITE_OTHER): Payer: Medicare Other | Admitting: Vascular Surgery

## 2023-06-09 ENCOUNTER — Ambulatory Visit: Payer: Medicare Other

## 2023-06-10 DIAGNOSIS — R319 Hematuria, unspecified: Secondary | ICD-10-CM | POA: Diagnosis not present

## 2023-06-10 DIAGNOSIS — I129 Hypertensive chronic kidney disease with stage 1 through stage 4 chronic kidney disease, or unspecified chronic kidney disease: Secondary | ICD-10-CM | POA: Diagnosis not present

## 2023-06-10 DIAGNOSIS — N1832 Chronic kidney disease, stage 3b: Secondary | ICD-10-CM | POA: Diagnosis not present

## 2023-06-10 DIAGNOSIS — D573 Sickle-cell trait: Secondary | ICD-10-CM | POA: Diagnosis not present

## 2023-06-10 DIAGNOSIS — Q613 Polycystic kidney, unspecified: Secondary | ICD-10-CM | POA: Diagnosis not present

## 2023-06-10 DIAGNOSIS — D631 Anemia in chronic kidney disease: Secondary | ICD-10-CM | POA: Diagnosis not present

## 2023-06-11 ENCOUNTER — Ambulatory Visit: Payer: Medicare Other | Admitting: Internal Medicine

## 2023-06-11 ENCOUNTER — Ambulatory Visit: Payer: Medicare Other

## 2023-06-12 ENCOUNTER — Encounter: Payer: Medicare Other | Admitting: *Deleted

## 2023-06-12 DIAGNOSIS — D860 Sarcoidosis of lung: Secondary | ICD-10-CM

## 2023-06-12 NOTE — Progress Notes (Signed)
Daily Session Note  Patient Details  Name: Melissa [name] MRN: [id number] Date of Birth: [birth year] Referring Provider:   Flowsheet Row Pulmonary Rehab from 04/28/2023 in [facility] Cardiac and Pulmonary Rehab  Referring Provider Dr. Belia Heman       Encounter Date: 06/12/2023  Check In:  Session Check In - 06/12/23 1105       Check-In   Supervising physician immediately available to respond to emergencies See telemetry face sheet for immediately available ER MD    Location [facility]-Cardiac & Pulmonary Rehab    Staff Present Susann Givens, RN BSN;Joseph Reino Kent, Guinevere Ferrari, RN, [place]    Virtual Visit No    Medication changes reported     Yes    Comments d/c losartan; started jardiance    Fall or balance concerns reported    No    Warm-up and Cool-down Performed on first and last piece of equipment    Resistance Training Performed Yes    VAD Patient? No    PAD/SET Patient? No      Pain Assessment   Currently in Pain? No/denies                Social History   Tobacco Use  Smoking Status Never  Smokeless Tobacco Never  Tobacco Comments   Second hand smoke exposure    Goals Met:  Independence with exercise equipment Exercise tolerated well No report of concerns or symptoms today Strength training completed today  Goals Unmet:  Not Applicable  Comments: Pt able to follow exercise prescription today without complaint.  Will continue to monitor for progression.    Dr. Bethann Punches is Medical Director for [facility] Cardiac Rehabilitation.  Dr. Vida Rigger is Medical Director for [facility] Pulmonary Rehabilitation.

## 2023-06-13 ENCOUNTER — Ambulatory Visit: Payer: Medicare Other

## 2023-06-16 ENCOUNTER — Ambulatory Visit: Payer: Medicare Other

## 2023-06-17 ENCOUNTER — Encounter: Payer: Medicare Other | Admitting: *Deleted

## 2023-06-17 DIAGNOSIS — D860 Sarcoidosis of lung: Secondary | ICD-10-CM | POA: Diagnosis not present

## 2023-06-17 NOTE — Progress Notes (Signed)
Daily Session Note  Patient Details  Name: NICHOLLE COLDREN MRN: [id number] Date of Birth: [birth year] Referring Provider:   Flowsheet Row Pulmonary Rehab from 04/28/2023 in [facility]  Referring Provider Dr. Belia Heman       Encounter Date: 06/17/2023  Check In:  Session Check In - 06/17/23 1124       Check-In   Supervising physician immediately available to respond to emergencies See telemetry face sheet for immediately available ER MD    Location [facility]    Staff Present Cora Collum, RN, BSN, CCRP;Jason Wallace Cullens, RDN, LDN;Meredith Jewel Baize, RN BSN;Maxon Conetta BS, , Exercise Physiologist    Virtual Visit No    Medication changes reported     No    Fall or balance concerns reported    No    Warm-up and Cool-down Performed on first and last piece of equipment    Resistance Training Performed Yes    VAD Patient? No    PAD/SET Patient? No      Pain Assessment   Currently in Pain? No/denies                Social History   Tobacco Use  Smoking Status Never  Smokeless Tobacco Never  Tobacco Comments   Second hand smoke exposure    Goals Met:  Proper associated with RPD/PD & O2 Sat Independence with exercise equipment Exercise tolerated well No report of concerns or symptoms today  Goals Unmet:  Not Applicable  Comments: Pt able to follow exercise prescription today without complaint.  Will continue to monitor for progression.    Dr. Bethann Punches is Medical Director for [facility] Card[facility].  Dr. Vida Rigger is Medical Director for [facility] Pulmonary Rehabilitation.

## 2023-06-18 ENCOUNTER — Ambulatory Visit: Payer: Medicare Other

## 2023-06-18 ENCOUNTER — Encounter: Payer: Self-pay | Admitting: *Deleted

## 2023-06-18 DIAGNOSIS — F411 Generalized anxiety disorder: Secondary | ICD-10-CM | POA: Diagnosis not present

## 2023-06-18 DIAGNOSIS — D860 Sarcoidosis of lung: Secondary | ICD-10-CM

## 2023-06-18 NOTE — Progress Notes (Signed)
Pulmonary Individual Treatment Plan  Patient Details  Name: Melissa [name] MRN: [id number] Date of Birth: June 18, 1951 Referring Provider:   Flowsheet Row Pulmonary Rehab from 04/28/2023 in [facility] Cardiac and Pulmonary Rehab  Referring Provider Dr. Belia Heman       Initial Encounter Date:  Flowsheet Row Pulmonary Rehab from 04/28/2023 in [facility] Cardiac and Pulmonary Rehab  Date 04/28/23       Visit Diagnosis: Sarcoidosis of lung (HCC)  Patient's Home Medications on Admission:  Current Outpatient Medications:    acetaminophen (TYLENOL) 650 MG CR tablet, Take 1,300 mg by mouth every 8 (eight) hours as needed for pain., Disp: , Rfl:    albuterol (VENTOLIN HFA) 108 (90 Base) MCG/ACT inhaler, INHALE 1-2 PUFFS BY MOUTH EVERY 6 HOURS AS NEEDED FOR WHEEZE OR SHORTNESS OF BREATH, Disp: 18 each, Rfl: 0   amLODipine (NORVASC) 10 MG tablet, Take 5 mg by mouth daily., Disp: , Rfl:    Ascorbic Acid (VITAMIN C) 1000 MG tablet, Take 1,000 mg by mouth daily., Disp: , Rfl:    aspirin EC 81 MG tablet, Take 1 tablet by mouth daily., Disp: , Rfl:    atorvastatin (LIPITOR) 40 MG tablet, Take 1 tablet (40 mg total) by mouth daily., Disp: 90 tablet, Rfl: 1   azelastine (ASTELIN) 0.1 % nasal spray, Place 2 sprays into both nostrils 2 (two) times daily. Use in each nostril as directed, Disp: 30 mL, Rfl: 12   budesonide (PULMICORT) 0.5 MG/2ML nebulizer solution, Take 2 mLs (0.5 mg total) by nebulization in the morning and at bedtime., Disp: 120 mL, Rfl: 6   calcium carbonate (TUMS - DOSED IN MG ELEMENTAL CALCIUM) 500 MG chewable tablet, Chew 500-1,000 mg by mouth daily as needed for indigestion or heartburn., Disp: , Rfl:    chlorhexidine (PERIDEX) 0.12 % solution, PLEASE SEE ATTACHED FOR DETAILED DIRECTIONS, Disp: , Rfl:    Cholecalciferol (D 1000) 25 MCG (1000 UT) capsule, Take 1,000 Units by mouth daily., Disp: , Rfl:    ezetimibe (ZETIA) 10 MG tablet, Take 1 tablet (10 mg total) by mouth daily., Disp: 90 tablet, Rfl:  1   ferrous sulfate 325 (65 FE) MG tablet, Take 325 mg by mouth daily with breakfast., Disp: , Rfl:    fluticasone (FLONASE) 50 MCG/ACT nasal spray, SPRAY 2 SPRAYS INTO EACH NOSTRIL EVERY DAY, Disp: 48 mL, Rfl: 1   gabapentin (NEURONTIN) 100 MG capsule, Take 100-200 mg by mouth 2 (two) times daily. One in am and two at night, Disp: , Rfl:    ipratropium-albuterol (DUONEB) 0.5-2.5 (3) MG/3ML SOLN, Take 3 mLs by nebulization every 6 (six) hours as needed., Disp: 360 mL, Rfl: 3   levocetirizine (XYZAL ALLERGY 24HR) 5 MG tablet, Take 1 tablet (5 mg total) by mouth every evening., Disp: 30 tablet, Rfl: 6   losartan (COZAAR) 25 MG tablet, Take 1 tablet (25 mg total) by mouth daily., Disp: 90 tablet, Rfl: 0   magnesium oxide (MAG-OX) 400 MG tablet, Take 400 mg by mouth daily., Disp: , Rfl:    methocarbamol (ROBAXIN) 500 MG tablet, Take 500 mg by mouth every 8 (eight) hours as needed for muscle spasms., Disp: , Rfl:    Multiple Vitamins-Minerals (CENTRUM SILVER) tablet, Take 1 tablet by mouth daily., Disp: 30 tablet, Rfl: 0   nortriptyline (PAMELOR) 10 MG capsule, Take 10-20 mg by mouth at bedtime., Disp: , Rfl:    Peppermint Oil (IBGARD) 90 MG CPCR, Take 2 capsules by mouth 2 (two) times daily., Disp: , Rfl:  Probiotic Product (FORTIFY PROBIOTIC WOMENS EX ST PO), Take by mouth daily., Disp: , Rfl:    propranolol (INDERAL) 10 MG tablet, Take 1 tablet (10 mg total) by mouth 3 (three) times daily as needed. For sustained fast heart rate, Disp: 60 tablet, Rfl: 3   TRELEGY ELLIPTA 200-62.5-25 MCG/ACT AEPB, INHALE ONE ACTUATION INTO THE LUNGS DAILY, Disp: 60 each, Rfl: 5   zinc gluconate 50 MG tablet, Take 50 mg by mouth daily., Disp: , Rfl:   Past Medical History: Past Medical History:  Diagnosis Date   Aortic atherosclerosis (HCC)    Asthma    Bruising    Cardiomyopathy (HCC)    Celiac artery aneurysm (HCC)    Cervical radiculopathy    CKD (chronic kidney disease), stage III (HCC)    Deficiency of  vitamin B    Depression    Edema    Flank pain    Gross hematuria    History of 2019 novel coronavirus disease (COVID-19) 05/01/2019   History of bariatric surgery    HLD (hyperlipidemia)    HTN (hypertension)    Interstitial lung disease (HCC)    Iron deficiency anemia    Leukopenia    OA (osteoarthritis)    Obesity    OSA on CPAP    Osteopenia    Perennial allergic rhinitis    Pneumonia due to COVID-19 virus 2020   Renal cysts, acquired, bilateral    RLS (restless legs syndrome)    Sarcoidosis    Sickle cell trait (HCC)    Snoring    Spinal stenosis    Trigeminal neuralgia     Tobacco Use: Social History   Tobacco Use  Smoking Status Never  Smokeless Tobacco Never  Tobacco Comments   Second hand smoke exposure    Labs: Review Flowsheet  More data exists      Latest Ref Rng & Units 10/18/2020 04/10/2021 04/10/2022 01/17/2023 01/18/2023  Labs for ITP Cardiac and Pulmonary Rehab  Cholestrol 0 - 200 mg/dL - 161  096  - 045   LDL (calc) 0 - 99 mg/dL - 94  57  - 54   HDL-C >40 mg/dL - 92  82  - 72   Trlycerides <150 mg/dL - 72  50  - 42   Hemoglobin A1c 4.8 - 5.6 % - - - 5.9  -  TCO2 22 - 32 mmol/L 25  - - - -    Details             Pulmonary Assessment Scores:  Pulmonary Assessment Scores     Row Name 04/28/23 1515         ADL UCSD   ADL Phase Entry     SOB Score total 14     Rest 0     Walk 0     Stairs 2     Bath 0     Dress 1     Shop 2       CAT Score   CAT Score 6       mMRC Score   mMRC Score 1              UCSD: Self-administered rating of dyspnea associated with activities of daily living (ADLs) 6-point scale (0 = "not at all" to 5 = "maximal or unable to do because of breathlessness")  Scoring Scores range from 0 to 120.  Minimally important difference is 5 units  CAT: CAT can identify the health impairment of COPD patients  and is better correlated with disease progression.  CAT has a scoring range of zero to 40. The CAT  score is classified into four groups of low (less than 10), medium (10 - 20), high (21-30) and very high (31-40) based on the impact level of disease on health status. A CAT score over 10 suggests significant symptoms.  A worsening CAT score could be explained by an exacerbation, poor medication adherence, poor inhaler technique, or progression of COPD or comorbid conditions.  CAT MCID is 2 points  mMRC: mMRC (Modified Medical Research Council) Dyspnea Scale is used to assess the degree of baseline functional disability in patients of respiratory disease due to dyspnea. No minimal important difference is established. A decrease in score of 1 point or greater is considered a positive change.   Pulmonary Function Assessment:  Pulmonary Function Assessment - 04/14/23 1018       Breath   Shortness of Breath No             Exercise Target Goals: Exercise Program Goal: Individual exercise prescription set using results from initial 6 min walk test and THRR while considering  patient's activity barriers and safety.   Exercise Prescription Goal: Initial exercise prescription builds to 30-45 minutes a day of aerobic activity, 2-3 days per week.  Home exercise guidelines will be given to patient during program as part of exercise prescription that the participant will acknowledge.  Education: Aerobic Exercise: - Group verbal and visual presentation on the components of exercise prescription. Introduces F.I.T.T principle from ACSM for exercise prescriptions.  Reviews F.I.T.T. principles of aerobic exercise including progression. Written material given at graduation. Flowsheet Row Pulmonary Rehab from 06/12/2023 in [facility] Cardiac and Pulmonary Rehab  Education need identified 04/28/23       Education: Resistance Exercise: - Group verbal and visual presentation on the components of exercise prescription. Introduces F.I.T.T principle from ACSM for exercise prescriptions  Reviews F.I.T.T.  principles of resistance exercise including progression. Written material given at graduation. Flowsheet Row Pulmonary Rehab from 06/12/2023 in [facility] Cardiac and Pulmonary Rehab  Date 06/12/23  Educator MB  Instruction Review Code 1- [employer] Understanding        Education: Exercise & Equipment Safety: - Individual verbal instruction and demonstration of equipment use and safety with use of the equipment. Flowsheet Row Pulmonary Rehab from 06/12/2023 in [facility] Cardiac and Pulmonary Rehab  Date 04/28/23  Educator [facility]  Instruction Review Code 1- Verbalizes Understanding       Education: Exercise Physiology & General Exercise Guidelines: - Group verbal and written instruction with models to review the exercise physiology of the cardiovascular system and associated critical values. Provides general exercise guidelines with specific guidelines to those with heart or lung disease.  Flowsheet Row Pulmonary Rehab from 06/12/2023 in [facility] Cardiac and Pulmonary Rehab  Education need identified 04/28/23  Date 06/05/23  Educator MB  Instruction Review Code 1- [employer] Understanding       Education: Flexibility, Balance, Mind/Body Relaxation: - Group verbal and visual presentation with interactive activity on the components of exercise prescription. Introduces F.I.T.T principle from ACSM for exercise prescriptions. Reviews F.I.T.T. principles of flexibility and balance exercise training including progression. Also discusses the mind body connection.  Reviews various relaxation techniques to help reduce and manage stress (i.e. Deep breathing, progressive muscle relaxation, and visualization). Balance handout provided to take home. Written material given at graduation. Flowsheet Row Pulmonary Rehab from 06/12/2023 in [facility] Cardiac and Pulmonary Rehab  Date 06/12/23  Educator MB  Instruction  Review Code 1- Verbalizes Understanding       Activity Barriers & Risk Stratification:  Activity  Barriers & Cardiac Risk Stratification - 04/28/23 1502       Activity Barriers & Cardiac Risk Stratification   Activity Barriers Right Knee Replacement;Back Problems;Other (comment)    Comments left knee stiffness and some hip pain             6 Minute Walk:  6 Minute Walk     Row Name 04/28/23 1459         6 Minute Walk   Phase Initial     Distance 1290 feet     Walk Time 6 minutes     # of Rest Breaks 0     MPH 2.44     METS 3.45     RPE 13     Perceived Dyspnea  0     VO2 Peak 12.06     Symptoms No     Resting HR 93 bpm     Resting BP 122/72     Resting Oxygen Saturation  95 %     Exercise Oxygen Saturation  during 6 min walk 93 %     Max Ex. HR 130 bpm     Max Ex. BP 158/80     2 Minute Post BP 118/62       Interval HR   1 Minute HR 114     2 Minute HR 117     3 Minute HR 118     4 Minute HR 120     5 Minute HR 130     6 Minute HR 127     2 Minute Post HR 101     Interval Heart Rate? Yes       Interval Oxygen   Interval Oxygen? Yes     Baseline Oxygen Saturation % 95 %     1 Minute Oxygen Saturation % 98 %     1 Minute Liters of Oxygen 0 L     2 Minute Oxygen Saturation % 93 %     2 Minute Liters of Oxygen 0 L     3 Minute Oxygen Saturation % 94 %     3 Minute Liters of Oxygen 0 L     4 Minute Oxygen Saturation % 95 %     4 Minute Liters of Oxygen 0 L     5 Minute Oxygen Saturation % 93 %     5 Minute Liters of Oxygen 0 L     6 Minute Oxygen Saturation % 95 %     6 Minute Liters of Oxygen 0 L     2 Minute Post Oxygen Saturation % 94 %     2 Minute Post Liters of Oxygen 0 L             Oxygen Initial Assessment:  Oxygen Initial Assessment - 04/28/23 1514       Home Oxygen   Home Oxygen Device None    Sleep Oxygen Prescription CPAP    Liters per minute 0    Home Exercise Oxygen Prescription None    Home Resting Oxygen Prescription None    Compliance with Home Oxygen Use Yes      Initial 6 min Walk   Oxygen Used None      Program  Oxygen Prescription   Program Oxygen Prescription None      Intervention   Short Term Goals To learn and exhibit compliance with  exercise, home and travel O2 prescription;To learn and understand importance of monitoring SPO2 with pulse oximeter and demonstrate accurate use of the pulse oximeter.;To learn and understand importance of maintaining oxygen saturations>88%;To learn and demonstrate proper pursed lip breathing techniques or other breathing techniques. ;To learn and demonstrate proper use of respiratory medications    Long  Term Goals Exhibits compliance with exercise, home  and travel O2 prescription;Maintenance of O2 saturations>88%;Compliance with respiratory medication;Demonstrates proper use of MDI's;Exhibits proper breathing techniques, such as pursed lip breathing or other method taught during program session;Verbalizes importance of monitoring SPO2 with pulse oximeter and return demonstration             Oxygen Re-Evaluation:  Oxygen Re-Evaluation     Row Name 06/05/23 1122             Program Oxygen Prescription   Program Oxygen Prescription None         Home Oxygen   Home Oxygen Device None       Sleep Oxygen Prescription CPAP       Liters per minute 0       Home Resting Oxygen Prescription None       Compliance with Home Oxygen Use Yes         Goals/Expected Outcomes   Short Term Goals To learn and demonstrate proper pursed lip breathing techniques or other breathing techniques.        Long  Term Goals Exhibits proper breathing techniques, such as pursed lip breathing or other method taught during program session       Comments Informed patient how to perform the Pursed Lipped breathing technique. Told patient to Inhale through the nose and out the mouth with pursed lips to keep their airways open, help oxygenate them better, practice when at rest or doing strenuous activity. Patient Verbalizes understanding of technique and will work on and be reiterated during  [facility].       Goals/Expected Outcomes Short: use PLB with exertion. Long: use PLB on exertion proficiently and independently.                Oxygen Discharge (Final Oxygen Re-Evaluation):  Oxygen Re-Evaluation - 06/05/23 1122       Program Oxygen Prescription   Program Oxygen Prescription None      Home Oxygen   Home Oxygen Device None    Sleep Oxygen Prescription CPAP    Liters per minute 0    Home Resting Oxygen Prescription None    Compliance with Home Oxygen Use Yes      Goals/Expected Outcomes   Short Term Goals To learn and demonstrate proper pursed lip breathing techniques or other breathing techniques.     Long  Term Goals Exhibits proper breathing techniques, such as pursed lip breathing or other method taught during program session    Comments Informed patient how to perform the Pursed Lipped breathing technique. Told patient to Inhale through the nose and out the mouth with pursed lips to keep their airways open, help oxygenate them better, practice when at rest or doing strenuous activity. Patient Verbalizes understanding of technique and will work on and be reiterated during [facility].    Goals/Expected Outcomes Short: use PLB with exertion. Long: use PLB on exertion proficiently and independently.             Initial Exercise Prescription:  Initial Exercise Prescription - 04/28/23 1500       Date of Initial Exercise RX and Referring Provider   Date 04/28/23  Referring Provider Dr. Belia Heman      Oxygen   Maintain Oxygen Saturation 88% or higher      Treadmill   MPH 3    Grade 0    Minutes 15    METs 3.5      NuStep   Level 2   T6   SPM 80    Minutes 15    METs 3.45      Elliptical   Level 1    Speed 2.5    Minutes 15    METs 3.45      REL-XR   Level 2    Speed 50    Minutes 15    METs 3.45      T5 Nustep   Level 2    SPM 80    Minutes 15    METs 3.45      Prescription Details   Frequency (times per week) 3    Duration  Progress to 30 minutes of continuous aerobic without signs/symptoms of physical distress      Intensity   THRR 40-80% of Max Heartrate 115-138    Ratings of Perceived Exertion 11-13    Perceived Dyspnea 0-4      Progression   Progression Continue to progress workloads to maintain intensity without signs/symptoms of physical distress.      Resistance Training   Training Prescription Yes    Weight 4    Reps 10-15             Perform Capillary Blood Glucose checks as needed.  Exercise Prescription Changes:   Exercise Prescription Changes     Row Name 04/28/23 1500 05/08/23 1600 05/22/23 1700 06/05/23 1400       Response to Exercise   Blood Pressure (Admit) 122/72 112/60 122/62 134/70    Blood Pressure (Exercise) 158/80 116/74 170/70 160/70    Blood Pressure (Exit) 118/62 110/60 102/60 110/70    Heart Rate (Admit) 93 bpm 112 bpm 78 bpm 82 bpm    Heart Rate (Exercise) 130 bpm 128 bpm 132 bpm 132 bpm    Heart Rate (Exit) 101 bpm 113 bpm 126 bpm 105 bpm    Oxygen Saturation (Admit) 95 % 95 % 99 % 98 %    Oxygen Saturation (Exercise) 93 % 90 % 90 % 81 %    Oxygen Saturation (Exit) 94 % 98 % 95 % 97 %    Rating of Perceived Exertion (Exercise) 13 13 15 15     Perceived Dyspnea (Exercise) 0 0 0 1    Symptoms none none none none    Comments 6 MWT results First week of exercise -- --    Duration -- Progress to 30 minutes of  aerobic without signs/symptoms of physical distress Progress to 30 minutes of  aerobic without signs/symptoms of physical distress Progress to 30 minutes of  aerobic without signs/symptoms of physical distress    Intensity -- THRR unchanged THRR unchanged THRR unchanged      Progression   Progression -- Continue to progress workloads to maintain intensity without signs/symptoms of physical distress. Continue to progress workloads to maintain intensity without signs/symptoms of physical distress. Continue to progress workloads to maintain intensity without  signs/symptoms of physical distress.    Average METs -- 3.72 3.96 3.8      Resistance Training   Training Prescription -- Yes Yes Yes    Weight -- 4 4 5  lb    Reps -- 10-15 10-15 10-15  Interval Training   Interval Training -- No No No      Treadmill   MPH -- 2.8 3 3     Grade -- 1.5 0.5 0.5    Minutes -- 15 15 15     METs -- 3.72 3.5 3.5      NuStep   Level -- 5 5 --    Minutes -- 15 15 --    METs -- 4.2 4.2 --      Elliptical   Level -- -- 1 1  10  min    Speed -- -- 2.5 2.5    Minutes -- -- 15 15      REL-XR   Level -- -- 5 5    Minutes -- -- 15 15    METs -- -- 5.6 7.1      T5 Nustep   Level -- 2 2 3     Minutes -- 15 15 15     METs -- -- -- 3.1      Oxygen   Maintain Oxygen Saturation -- 88% or higher 88% or higher 88% or higher             Exercise Comments:   Exercise Goals and Review:   Exercise Goals     Row Name 04/28/23 1509             Exercise Goals   Increase Physical Activity Yes       Intervention Provide advice, education, support and counseling about physical activity/exercise needs.;Develop an individualized exercise prescription for aerobic and resistive training based on initial evaluation findings, risk stratification, comorbidities and participant's personal goals.       Expected Outcomes Short Term: Attend rehab on a regular basis to increase amount of physical activity.;Long Term: Add in home exercise to make exercise part of routine and to increase amount of physical activity.;Long Term: Exercising regularly at least 3-5 days a week.       Increase Strength and Stamina Yes       Intervention Provide advice, education, support and counseling about physical activity/exercise needs.;Develop an individualized exercise prescription for aerobic and resistive training based on initial evaluation findings, risk stratification, comorbidities and participant's personal goals.       Expected Outcomes Short Term: Increase workloads from  initial exercise prescription for resistance, speed, and METs.;Short Term: Perform resistance training exercises routinely during rehab and add in resistance training at home;Long Term: Improve cardiorespiratory fitness, muscular endurance and strength as measured by increased METs and functional capacity ( )       Able to understand and use rate of perceived exertion (RPE) scale Yes       Intervention Provide education and explanation on how to use RPE scale       Expected Outcomes Short Term: Able to use RPE daily in rehab to express subjective intensity level;Long Term:  Able to use RPE to guide intensity level when exercising independently       Able to understand and use Dyspnea scale Yes       Intervention Provide education and explanation on how to use Dyspnea scale       Expected Outcomes Short Term: Able to use Dyspnea scale daily in rehab to express subjective sense of shortness of breath during exertion;Long Term: Able to use Dyspnea scale to guide intensity level when exercising independently       Knowledge and understanding of Target Heart Rate Range (THRR) Yes       Intervention Provide education and explanation of  THRR including how the numbers were predicted and where they are located for reference       Expected Outcomes Short Term: Able to state/look up THRR;Long Term: Able to use THRR to govern intensity when exercising independently;Short Term: Able to use daily as guideline for intensity in rehab       Able to check pulse independently Yes       Intervention Provide education and demonstration on how to check pulse in carotid and radial arteries.;Review the importance of being able to check your own pulse for safety during independent exercise       Expected Outcomes Short Term: Able to explain why pulse checking is important during independent exercise;Long Term: Able to check pulse independently and accurately       Understanding of Exercise Prescription Yes        Intervention Provide education, explanation, and written materials on patient's individual exercise prescription       Expected Outcomes Short Term: Able to explain program exercise prescription;Long Term: Able to explain home exercise prescription to exercise independently                Exercise Goals Re-Evaluation :  Exercise Goals Re-Evaluation     Row Name 05/08/23 1647 05/22/23 1712 06/05/23 1443         Exercise Goal Re-Evaluation   Exercise Goals Review Increase Physical Activity;Increase Strength and Stamina;Understanding of Exercise Prescription Increase Physical Activity;Increase Strength and Stamina;Understanding of Exercise Prescription Increase Physical Activity;Increase Strength and Stamina;Understanding of Exercise Prescription     Comments Brooklynn is off to a good start in the program. She attended 2 sessions during this review, and was able to use the treadmill, T4, and T5 nustep. We will continue to monitor her progress in the program. Dennie Bible continues to make improvements at rehab. She has recently increased her level on the XR from 2 to 5. She has also added 0.5% incline to her prescribed on the treadmill. We will continue to monitor her progress in the program. Dennie Bible continues to do well in rehab. She has been able to increase her hand weights from 4 to 5lb, and increase her level on the T5 nustep from level 2 to 3. She has also been able to maintain a consistent workload on the treadmill, and continues to try to use the elliptical for the full 20 minutes. We will continue to monitor her progress in the program.     Expected Outcomes Short: Continue to follow current exercise prescription. Long: Continue exercise to improve strength and stamina. Short: Continue to follow current exercise prescription. Long: Continue exercise to improve strength and stamina. Short: Continue to work to improve time on elliptical. Long: Continue exercise to improve strength and stamina.               Discharge Exercise Prescription (Final Exercise Prescription Changes):  Exercise Prescription Changes - 06/05/23 1400       Response to Exercise   Blood Pressure (Admit) 134/70    Blood Pressure (Exercise) 160/70    Blood Pressure (Exit) 110/70    Heart Rate (Admit) 82 bpm    Heart Rate (Exercise) 132 bpm    Heart Rate (Exit) 105 bpm    Oxygen Saturation (Admit) 98 %    Oxygen Saturation (Exercise) 81 %    Oxygen Saturation (Exit) 97 %    Rating of Perceived Exertion (Exercise) 15    Perceived Dyspnea (Exercise) 1    Symptoms none    Duration  Progress to 30 minutes of  aerobic without signs/symptoms of physical distress    Intensity THRR unchanged      Progression   Progression Continue to progress workloads to maintain intensity without signs/symptoms of physical distress.    Average METs 3.8      Resistance Training   Training Prescription Yes    Weight 5 lb    Reps 10-15      Interval Training   Interval Training No      Treadmill   MPH 3    Grade 0.5    Minutes 15    METs 3.5      Elliptical   Level 1   10 min   Speed 2.5    Minutes 15      REL-XR   Level 5    Minutes 15    METs 7.1      T5 Nustep   Level 3    Minutes 15    METs 3.1      Oxygen   Maintain Oxygen Saturation 88% or higher             Nutrition:  Target Goals: Understanding of nutrition guidelines, daily intake of sodium 1500mg , cholesterol 200mg , calories 30% from fat and 7% or less from saturated fats, daily to have 5 or more servings of fruits and vegetables.  Education: All About Nutrition: -Group instruction provided by verbal, written material, interactive activities, discussions, models, and posters to present general guidelines for heart healthy nutrition including fat, fiber, MyPlate, the role of sodium in heart healthy nutrition, utilization of the nutrition label, and utilization of this knowledge for meal planning. Follow up email sent as well. Written  material given at graduation. Flowsheet Row Pulmonary Rehab from 06/12/2023 in [facility] Cardiac and Pulmonary Rehab  Education need identified 04/28/23       Biometrics:  Pre Biometrics - 04/28/23 1511       Pre Biometrics   Height 6' (1.829 m)    Weight 210 lb (95.3 kg)    Waist Circumference 36 inches    Hip Circumference 47 inches    Waist to Hip Ratio 0.77 %    BMI (Calculated) 28.47    Single Leg Stand 9.43 seconds              Nutrition Therapy Plan and Nutrition Goals:  Nutrition Therapy & Goals - 04/28/23 1511       Nutrition Therapy   Diet Cardiac, Low Na    Protein (specify units) 90    Fiber 25 grams    Whole Grain Foods 3 servings    Saturated Fats 15 max. grams    Fruits and Vegetables 5 servings/day    Sodium 2 grams      Personal Nutrition Goals   Nutrition Goal Eat a small but nutrient dense breakfast most days the week    Personal Goal #2 Eat a protein and carb at each meal    Personal Goal #3 Cut back on sweets, look for healthier snacks    Comments Patient drinking water and coffee only. She reports she often skips lunch due to eating breakfast late, around 11am. She reports she wakes up early and goes to water aerobics without eating breakfast. Brainstormed things to make breakfast more achievable for her, focus was on smaller portions, quick to make, grab and go friendly. Educated on what makes a good meal and snack. Set goal to pair protein and carbs together at each meal. She finds herself  snacking on sweets often, talked about better snacks to try more often, reducing the frequency of these sugary foods. Reviewed mediterranean diet handout, types of fats, sources, how to read label. Built out several meals and snacks with foods she likes and will eat. Focus on smaller portions, healthy fats and reducing sugar intake from poor food choices. She is very optimistic about making changes to her eating structure and will work on consistency.       Intervention Plan   Intervention Prescribe, educate and counsel regarding individualized specific dietary modifications aiming towards targeted core components such as weight, hypertension, lipid management, diabetes, heart failure and other comorbidities.;Nutrition handout(s) given to patient.    Expected Outcomes Long Term Goal: Adherence to prescribed nutrition plan.;Short Term Goal: A plan has been developed with personal nutrition goals set during dietitian appointment.;Short Term Goal: Understand basic principles of dietary content, such as calories, fat, sodium, cholesterol and nutrients.             Nutrition Assessments:  MEDIFICTS Score Key: >=70 Need to make dietary changes  40-70 Heart Healthy Diet <= 40 Therapeutic Level Cholesterol Diet  Flowsheet Row Pulmonary Rehab from 04/28/2023 in [facility] Cardiac and Pulmonary Rehab  Picture Your Plate Total Score on Admission 48      Picture Your Plate Scores: <40 Unhealthy dietary pattern with much room for improvement. 41-50 Dietary pattern unlikely to meet recommendations for good health and room for improvement. 51-60 More healthful dietary pattern, with some room for improvement.  >60 Healthy dietary pattern, although there may be some specific behaviors that could be improved.   Nutrition Goals Re-Evaluation:  Nutrition Goals Re-Evaluation     Row Name 06/05/23 1124             Goals   Current Weight 256 lb (116.1 kg)       Comment Patient was informed on why it is important to maintain a balanced diet when dealing with Respiratory issues. Explained that it takes a lot of energy to breath and when they are short of breath often they will need to have a good diet to help keep up with the calories they are expending for breathing.       Expected Outcome Short: Choose and plan snacks accordingly to patients caloric intake to improve breathing. Long: Maintain a diet independently that meets their caloric intake to aid in daily  shortness of breath.                Nutrition Goals Discharge (Final Nutrition Goals Re-Evaluation):  Nutrition Goals Re-Evaluation - 06/05/23 1124       Goals   Current Weight 256 lb (116.1 kg)    Comment Patient was informed on why it is important to maintain a balanced diet when dealing with Respiratory issues. Explained that it takes a lot of energy to breath and when they are short of breath often they will need to have a good diet to help keep up with the calories they are expending for breathing.    Expected Outcome Short: Choose and plan snacks accordingly to patients caloric intake to improve breathing. Long: Maintain a diet independently that meets their caloric intake to aid in daily shortness of breath.             Psychosocial: Target Goals: Acknowledge presence or absence of significant depression and/or stress, maximize coping skills, provide positive support system. Participant is able to verbalize types and ability to use techniques and skills needed for reducing  stress and depression.   Education: Stress, Anxiety, and Depression - Group verbal and visual presentation to define topics covered.  Reviews how body is impacted by stress, anxiety, and depression.  Also discusses healthy ways to reduce stress and to treat/manage anxiety and depression.  Written material given at graduation. Flowsheet Row Pulmonary Rehab from 06/12/2023 in [facility] Cardiac and Pulmonary Rehab  Date 05/29/23  Educator SB  Instruction Review Code 1- [employer] Understanding       Education: Sleep Hygiene -Provides group verbal and written instruction about how sleep can affect your health.  Define sleep hygiene, discuss sleep cycles and impact of sleep habits. Review good sleep hygiene tips.    Initial Review & Psychosocial Screening:  Initial Psych Review & Screening - 04/14/23 1020       Initial Review   Current issues with None Identified;Current Anxiety/Panic;History of Depression       Family Dynamics   Good Support System? Yes    Comments SHe has a good support system. She can look to her sons for support and her youngest son lives with her. Sometiems she has anxiet over the normal stresses of life but nothing significant.      Barriers   Psychosocial barriers to participate in program The patient should benefit from training in stress management and relaxation.;There are no identifiable barriers or psychosocial needs.      Screening Interventions   Interventions Encouraged to exercise;To provide support and resources with identified psychosocial needs;Provide feedback about the scores to participant    Expected Outcomes Short Term goal: Utilizing psychosocial counselor, staff and physician to assist with identification of specific Stressors or current issues interfering with healing process. Setting desired goal for each stressor or current issue identified.;Long Term Goal: Stressors or current issues are controlled or eliminated.;Short Term goal: Identification and review with participant of any Quality of Life or Depression concerns found by scoring the questionnaire.;Long Term goal: The participant improves quality of Life and PHQ9 Scores as seen by post scores and/or verbalization of changes             Quality of Life Scores:  Scores of 19 and below usually indicate a poorer quality of life in these areas.  A difference of  2-3 points is a clinically meaningful difference.  A difference of 2-3 points in the total score of the Quality of Life Index has been associated with significant improvement in overall quality of life, self-image, physical symptoms, and general health in studies assessing change in quality of life.  PHQ-9: Review Flowsheet  More data exists      04/28/2023 03/25/2023 03/14/2023 11/19/2022 10/31/2022  Depression screen PHQ 2/9  Decreased Interest 0 0 0 0 0  Down, Depressed, Hopeless 0 0 0 0 0  PHQ - 2 Score 0 0 0 0 0  Altered sleeping 0 0 0 0  0  Tired, decreased energy 1 0 0 1 1  Change in appetite 0 0 0 0 0  Feeling bad or failure about yourself  0 0 0 0 0  Trouble concentrating 1 0 0 0 0  Moving slowly or fidgety/restless 0 0 0 0 0  Suicidal thoughts 0 0 0 0 0  PHQ-9 Score 2 0 0 1 1  Difficult doing work/chores Not difficult at all - - - -    Details           Interpretation of Total Score  Total Score Depression Severity:  1-4 = Minimal depression, 5-9 =  Mild depression, 10-14 = Moderate depression, 15-19 = Moderately severe depression, 20-27 = Severe depression   Psychosocial Evaluation and Intervention:  Psychosocial Evaluation - 04/14/23 1022       Psychosocial Evaluation & Interventions   Interventions Encouraged to exercise with the program and follow exercise prescription;Relaxation education;Stress management education    Comments SHe has a good support system. She can look to her sons for support and her youngest son lives with her. Sometiems she has anxiet over the normal stresses of life but nothing significant.    Expected Outcomes Short: Start [facility] to help with mood. Long: Maintain a healthy mental state.    Continue Psychosocial Services  Follow up required by staff             Psychosocial Re-Evaluation:  Psychosocial Re-Evaluation     Row Name 06/05/23 1126             Psychosocial Re-Evaluation   Current issues with Current Psychotropic Meds;Current Anxiety/Panic;History of Depression       Comments Dennie Bible has some anxiety and takes medication for it. In the last few weeks she has not had any mood changes. She reports no issues with their current mental states, sleep, stress, depression or anxiety. Will follow up with patient in a few weeks for any changes.       Expected Outcomes Short: Continue to exercise regularly to support mental health and notify staff of any changes. Long: maintain mental health and well being through teaching of rehab or prescribed medications independently.        Interventions Encouraged to attend Pulmonary Rehabilitation for the exercise       Continue Psychosocial Services  Follow up required by staff                Psychosocial Discharge (Final Psychosocial Re-Evaluation):  Psychosocial Re-Evaluation - 06/05/23 1126       Psychosocial Re-Evaluation   Current issues with Current Psychotropic Meds;Current Anxiety/Panic;History of Depression    Comments Dennie Bible has some anxiety and takes medication for it. In the last few weeks she has not had any mood changes. She reports no issues with their current mental states, sleep, stress, depression or anxiety. Will follow up with patient in a few weeks for any changes.    Expected Outcomes Short: Continue to exercise regularly to support mental health and notify staff of any changes. Long: maintain mental health and well being through teaching of rehab or prescribed medications independently.    Interventions Encouraged to attend Pulmonary Rehabilitation for the exercise    Continue Psychosocial Services  Follow up required by staff             Education: Education Goals: Education classes will be provided on a weekly basis, covering required topics. Participant will state understanding/return demonstration of topics presented.  Learning Barriers/Preferences:  Learning Barriers/Preferences - 04/14/23 1018       Learning Barriers/Preferences   Learning Barriers None    Learning Preferences None             General Pulmonary Education Topics:  Infection Prevention: - Provides verbal and written material to individual with discussion of infection control including proper hand washing and proper equipment cleaning during exercise session. Flowsheet Row Pulmonary Rehab from 06/12/2023 in [facility] Cardiac and Pulmonary Rehab  Date 04/28/23  Educator [facility]  Instruction Review Code 1- Verbalizes Understanding       Falls Prevention: - Provides verbal and written material to individual with  discussion of  falls prevention and safety. Flowsheet Row Pulmonary Rehab from 06/12/2023 in [facility] Cardiac and Pulmonary Rehab  Date 04/28/23  Educator [facility]  Instruction Review Code 1- Verbalizes Understanding       Chronic Lung Disease Review: - Group verbal instruction with posters, models, PowerPoint presentations and videos,  to review new updates, new respiratory medications, new advancements in procedures and treatments. Providing information on websites and "800" numbers for continued self-education. Includes information about supplement oxygen, available portable oxygen systems, continuous and intermittent flow rates, oxygen safety, concentrators, and Medicare reimbursement for oxygen. Explanation of Pulmonary Drugs, including class, frequency, complications, importance of spacers, rinsing mouth after steroid MDI's, and proper cleaning methods for nebulizers. Review of basic lung anatomy and physiology related to function, structure, and complications of lung disease. Review of risk factors. Discussion about methods for diagnosing sleep apnea and types of masks and machines for OSA. Includes a review of the use of types of environmental controls: home humidity, furnaces, filters, dust mite/pet prevention, HEPA vacuums. Discussion about weather changes, air quality and the benefits of nasal washing. Instruction on Warning signs, infection symptoms, calling MD promptly, preventive modes, and value of vaccinations. Review of effective airway clearance, coughing and/or vibration techniques. Emphasizing that all should Create an Action Plan. Written material given at graduation. Flowsheet Row Pulmonary Rehab from 06/12/2023 in [facility] Cardiac and Pulmonary Rehab  Education need identified 04/28/23  Date 05/15/23  Educator [facility]  Instruction Review Code 1- Verbalizes Understanding       AED/CPR: - Group verbal and written instruction with the use of models to demonstrate the basic use of the AED with  the basic ABC's of resuscitation.    Anatomy and Cardiac Procedures: - Group verbal and visual presentation and models provide information about basic cardiac anatomy and function. Reviews the testing methods done to diagnose heart disease and the outcomes of the test results. Describes the treatment choices: Medical Management, Angioplasty, or Coronary Bypass Surgery for treating various heart conditions including Myocardial Infarction, Angina, Valve Disease, and Cardiac Arrhythmias.  Written material given at graduation.   Medication Safety: - Group verbal and visual instruction to review commonly prescribed medications for heart and lung disease. Reviews the medication, class of the drug, and side effects. Includes the steps to properly store meds and maintain the prescription regimen.  Written material given at graduation.   Other: -Provides group and verbal instruction on various topics (see comments)   Knowledge Questionnaire Score:  Knowledge Questionnaire Score - 04/28/23 1513       Knowledge Questionnaire Score   Pre Score 11/18              Core Components/Risk Factors/Patient Goals at Admission:  Personal Goals and Risk Factors at Admission - 04/28/23 1512       Core Components/Risk Factors/Patient Goals on Admission    Weight Management Yes;Weight Loss    Intervention Weight Management/Obesity: Establish reasonable short term and long term weight goals.;Weight Management: Provide education and appropriate resources to help participant work on and attain dietary goals.;Weight Management: Develop a combined nutrition and exercise program designed to reach desired caloric intake, while maintaining appropriate intake of nutrient and fiber, sodium and fats, and appropriate energy expenditure required for the weight goal.    Admit Weight 210 lb (95.3 kg)    Goal Weight: Short Term 200 lb (90.7 kg)    Goal Weight: Long Term 180 lb (81.6 kg)    Expected Outcomes Short Term:  Continue to assess and modify interventions  until short term weight is achieved;Long Term: Adherence to nutrition and physical activity/exercise program aimed toward attainment of established weight goal;Weight Loss: Understanding of general recommendations for a balanced deficit meal plan, which promotes 1-2 lb weight loss per week and includes a negative energy balance of [phone] kcal/d;Understanding recommendations for meals to include 15-35% energy as protein, 25-35% energy from fat, 35-60% energy from carbohydrates, less than 200mg  of dietary cholesterol, 20-35 gm of total fiber daily;Understanding of distribution of calorie intake throughout the day with the consumption of 4-5 meals/snacks    Improve shortness of breath with ADL's Yes    Intervention Provide education, individualized exercise plan and daily activity instruction to help decrease symptoms of SOB with activities of daily living.    Expected Outcomes Short Term: Improve cardiorespiratory fitness to achieve a reduction of symptoms when performing ADLs;Long Term: Be able to perform more ADLs without symptoms or delay the onset of symptoms    Hypertension Yes    Intervention Provide education on lifestyle modifcations including regular physical activity/exercise, weight management, moderate sodium restriction and increased consumption of fresh fruit, vegetables, and low fat dairy, alcohol moderation, and smoking cessation.;Monitor prescription use compliance.    Expected Outcomes Short Term: Continued assessment and intervention until BP is < 140/72mm HG in hypertensive participants. < 130/55mm HG in hypertensive participants with diabetes, heart failure or chronic kidney disease.;Long Term: Maintenance of blood pressure at goal levels.    Lipids Yes    Intervention Provide education and support for participant on nutrition & aerobic/resistive exercise along with prescribed medications to achieve LDL 70mg , HDL >40mg .    Expected Outcomes  Short Term: Participant states understanding of desired cholesterol values and is compliant with medications prescribed. Participant is following exercise prescription and nutrition guidelines.;Long Term: Cholesterol controlled with medications as prescribed, with individualized exercise RX and with personalized nutrition plan. Value goals: LDL < 70mg , HDL > 40 mg.             Education:Diabetes - Individual verbal and written instruction to review signs/symptoms of diabetes, desired ranges of glucose level fasting, after meals and with exercise. Acknowledge that pre and post exercise glucose checks will be done for 3 sessions at entry of program.   Know Your Numbers and Heart Failure: - Group verbal and visual instruction to discuss disease risk factors for cardiac and pulmonary disease and treatment options.  Reviews associated critical values for Overweight/Obesity, Hypertension, Cholesterol, and Diabetes.  Discusses basics of heart failure: signs/symptoms and treatments.  Introduces Heart Failure Zone chart for action plan for heart failure.  Written material given at graduation. Flowsheet Row Pulmonary Rehab from 06/12/2023 in [facility] Cardiac and Pulmonary Rehab  Education need identified 04/28/23  Date 05/22/23  Educator SB  Instruction Review Code 1- Verbalizes Understanding       Core Components/Risk Factors/Patient Goals Review:   Goals and Risk Factor Review     Row Name 06/05/23 1123             Core Components/Risk Factors/Patient Goals Review   Personal Goals Review Improve shortness of breath with ADL's       Review Spoke to patient about their shortness of breath and what they can do to improve. Patient has been informed of breathing techniques when starting the program. Patient is informed to tell staff if they have had any med changes and that certain meds they are taking or not taking can be causing shortness of breath.       Expected Outcomes  Short: Attend [facility]  regularly to improve shortness of breath with ADL's. Long: maintain independence with ADL's                Core Components/Risk Factors/Patient Goals at Discharge (Final Review):   Goals and Risk Factor Review - 06/05/23 1123       Core Components/Risk Factors/Patient Goals Review   Personal Goals Review Improve shortness of breath with ADL's    Review Spoke to patient about their shortness of breath and what they can do to improve. Patient has been informed of breathing techniques when starting the program. Patient is informed to tell staff if they have had any med changes and that certain meds they are taking or not taking can be causing shortness of breath.    Expected Outcomes Short: Attend [facility] regularly to improve shortness of breath with ADL's. Long: maintain independence with ADL's             ITP Comments:  ITP Comments     Row Name 04/14/23 1016 04/28/23 1458 05/21/23 0952 06/18/23 0838     ITP Comments Virtual Visit completed. Patient informed on EP and RD appointment and 6 Minute walk test. Patient also informed of patient health questionnaires on My Chart. Patient Verbalizes understanding. Visit diagnosis can be found in [facility] 09/30/2022. Completed and gym orientation. Initial ITP created and sent for review to Dr. Jinny Sanders, Medical Director. 30 Day review completed. Medical Director ITP review done, changes made as directed, and signed approval by Medical Director.    new to program 30 Day review completed. Medical Director ITP review done, changes made as directed, and signed approval by Medical Director.             Comments:

## 2023-06-19 ENCOUNTER — Other Ambulatory Visit: Payer: Self-pay | Admitting: Family Medicine

## 2023-06-19 ENCOUNTER — Encounter: Payer: Medicare Other | Admitting: *Deleted

## 2023-06-19 DIAGNOSIS — D860 Sarcoidosis of lung: Secondary | ICD-10-CM

## 2023-06-19 DIAGNOSIS — N1832 Chronic kidney disease, stage 3b: Secondary | ICD-10-CM

## 2023-06-19 NOTE — Progress Notes (Signed)
Daily Session Note  Patient Details  Name: Melissa [name] MRN: [id number] Date of Birth: 1951-05-08 Referring Provider:   Flowsheet Row Pulmonary Rehab from 04/28/2023 in [facility] Cardiac and Pulmonary Rehab  Referring Provider Dr. Belia Heman       Encounter Date: 06/19/2023  Check In:  Session Check In - 06/19/23 1102       Check-In   Supervising physician immediately available to respond to emergencies See telemetry face sheet for immediately available ER MD    Location [facility]-Cardiac & Pulmonary Rehab    Staff Present Ronette Deter, BS, Exercise Physiologist;Joseph [place], RCP,RRT,BSRT;Meredith [place], RN Atilano Median, RN, ADN    Virtual Visit No    Medication changes reported     No    Fall or balance concerns reported    No    Warm-up and Cool-down Performed on first and last piece of equipment    Resistance Training Performed Yes    VAD Patient? No    PAD/SET Patient? No      Pain Assessment   Currently in Pain? No/denies                Social History   Tobacco Use  Smoking Status Never  Smokeless Tobacco Never  Tobacco Comments   Second hand smoke exposure    Goals Met:  Independence with exercise equipment Exercise tolerated well No report of concerns or symptoms today Strength training completed today  Goals Unmet:  Not Applicable  Comments: Pt able to follow exercise prescription today without complaint.  Will continue to monitor for progression.    Dr. Bethann Punches is Medical Director for [facility] Cardiac Rehabilitation.  Dr. Vida Rigger is Medical Director for [facility] Pulmonary Rehabilitation.

## 2023-06-20 ENCOUNTER — Ambulatory Visit: Payer: Medicare Other

## 2023-06-23 ENCOUNTER — Ambulatory Visit: Payer: Medicare Other

## 2023-06-24 ENCOUNTER — Encounter: Payer: Medicare Other | Attending: Internal Medicine | Admitting: *Deleted

## 2023-06-24 DIAGNOSIS — D860 Sarcoidosis of lung: Secondary | ICD-10-CM | POA: Insufficient documentation

## 2023-06-24 DIAGNOSIS — Z5189 Encounter for other specified aftercare: Secondary | ICD-10-CM | POA: Diagnosis not present

## 2023-06-24 NOTE — Progress Notes (Signed)
Daily Session Note  Patient Details  Name: Melissa [name] MRN: [id number] Date of Birth: 1950/11/04 Referring Provider:   Flowsheet Row Pulmonary Rehab from 04/28/2023 in [facility] Cardiac and Pulmonary Rehab  Referring Provider Dr. Belia Heman       Encounter Date: 06/24/2023  Check In:  Session Check In - 06/24/23 1110       Check-In   Supervising physician immediately available to respond to emergencies See telemetry face sheet for immediately available ER MD    Location [facility]-Cardiac & Pulmonary Rehab    Staff Present Susann Givens, RN BSN;Makenli Derstine, RN, BSN, CCRP;Noah Tickle, BS, Exercise Physiologist;Maxon Conetta BS, , Exercise Physiologist    Virtual Visit No    Medication changes reported     No    Fall or balance concerns reported    No    Warm-up and Cool-down Performed on first and last piece of equipment    Resistance Training Performed Yes    VAD Patient? No    PAD/SET Patient? No      Pain Assessment   Currently in Pain? No/denies                Social History   Tobacco Use  Smoking Status Never  Smokeless Tobacco Never  Tobacco Comments   Second hand smoke exposure    Goals Met:  Proper associated with RPD/PD & O2 Sat Independence with exercise equipment Exercise tolerated well No report of concerns or symptoms today  Goals Unmet:  Not Applicable  Comments: Pt able to follow exercise prescription today without complaint.  Will continue to monitor for progression.    Dr. Bethann Punches is Medical Director for [facility] Cardiac Rehabilitation.  Dr. Vida Rigger is Medical Director for [facility] Pulmonary Rehabilitation.

## 2023-06-25 ENCOUNTER — Ambulatory Visit: Payer: Medicare Other

## 2023-06-26 ENCOUNTER — Encounter: Payer: Medicare Other | Admitting: *Deleted

## 2023-06-26 DIAGNOSIS — D860 Sarcoidosis of lung: Secondary | ICD-10-CM

## 2023-06-26 DIAGNOSIS — Z5189 Encounter for other specified aftercare: Secondary | ICD-10-CM | POA: Diagnosis not present

## 2023-06-26 NOTE — Progress Notes (Signed)
Daily Session Note  Patient Details  Name: Melissa [name] MRN: [id number] Date of Birth: 05-Feb-1951 Referring Provider:   Flowsheet Row Pulmonary Rehab from 04/28/2023 in [facility] Cardiac and Pulmonary Rehab  Referring Provider Dr. Belia Heman       Encounter Date: 06/26/2023  Check In:  Session Check In - 06/26/23 1110       Check-In   Supervising physician immediately available to respond to emergencies See telemetry face sheet for immediately available ER MD    Location [facility]-Cardiac & Pulmonary Rehab    Staff Present Ronette Deter, BS, Exercise Physiologist;Susanne Bice, RN, BSN, CCRP;Meredith [place], RN BSN;Joseph [place], Guinevere Ferrari, RN, [place]    Virtual Visit No    Medication changes reported     No    Fall or balance concerns reported    No    Warm-up and Cool-down Performed on first and last piece of equipment    Resistance Training Performed Yes    VAD Patient? No    PAD/SET Patient? No      Pain Assessment   Currently in Pain? No/denies                Social History   Tobacco Use  Smoking Status Never  Smokeless Tobacco Never  Tobacco Comments   Second hand smoke exposure    Goals Met:  Independence with exercise equipment Exercise tolerated well No report of concerns or symptoms today Strength training completed today  Goals Unmet:  Not Applicable  Comments: Pt able to follow exercise prescription today without complaint.  Will continue to monitor for progression.    Dr. Bethann Punches is Medical Director for [facility] Cardiac Rehabilitation.  Dr. Vida Rigger is Medical Director for [facility] Pulmonary Rehabilitation.

## 2023-06-27 ENCOUNTER — Ambulatory Visit: Payer: Medicare Other

## 2023-06-30 ENCOUNTER — Ambulatory Visit: Payer: Medicare Other

## 2023-07-01 DIAGNOSIS — D860 Sarcoidosis of lung: Secondary | ICD-10-CM | POA: Diagnosis not present

## 2023-07-01 DIAGNOSIS — Z5189 Encounter for other specified aftercare: Secondary | ICD-10-CM | POA: Diagnosis not present

## 2023-07-01 NOTE — Progress Notes (Signed)
Daily Session Note  Patient Details  Name: Melissa [name] MRN: [id number] Date of Birth: 1951-02-25 Referring Provider:   Flowsheet Row Pulmonary Rehab from 04/28/2023 in [facility] Cardiac and Pulmonary Rehab  Referring Provider Dr. Belia Heman       Encounter Date: 07/01/2023  Check In:  Session Check In - 07/01/23 1109       Check-In   Supervising physician immediately available to respond to emergencies See telemetry face sheet for immediately available ER MD    Location [facility]-Cardiac & Pulmonary Rehab    Staff Present Maxon Conetta BS, , Exercise Physiologist;Belvie Iribe [place], RN, BSN;Meredith Jewel Baize, RN BSN;Jason Wallace [place], RDN, LDN    Virtual Visit No    Medication changes reported     No    Fall or balance concerns reported    No    Warm-up and Cool-down Performed on first and last piece of equipment    Resistance Training Performed Yes    VAD Patient? No    PAD/SET Patient? No      Pain Assessment   Currently in Pain? No/denies                Social History   Tobacco Use  Smoking Status Never  Smokeless Tobacco Never  Tobacco Comments   Second hand smoke exposure    Goals Met:  Proper associated with RPD/PD & O2 Sat Independence with exercise equipment Using PLB without cueing & demonstrates good technique Exercise tolerated well No report of concerns or symptoms today  Goals Unmet:  Not Applicable  Comments: Pt able to follow exercise prescription today without complaint.  Will continue to monitor for progression.   Dr. Bethann Punches is Medical Director for [facility] Cardiac Rehabilitation.  Dr. Vida Rigger is Medical Director for [facility] Pulmonary Rehabilitation.

## 2023-07-02 ENCOUNTER — Ambulatory Visit: Payer: Medicare Other

## 2023-07-03 ENCOUNTER — Encounter: Payer: Medicare Other | Admitting: *Deleted

## 2023-07-03 DIAGNOSIS — D860 Sarcoidosis of lung: Secondary | ICD-10-CM

## 2023-07-03 DIAGNOSIS — Z5189 Encounter for other specified aftercare: Secondary | ICD-10-CM | POA: Diagnosis not present

## 2023-07-03 NOTE — Progress Notes (Signed)
Daily Session Note  Patient Details  Name: Melissa [name] MRN: [id number] Date of Birth: Jul 31, 1951 Referring Provider:   Flowsheet Row Pulmonary Rehab from 04/28/2023 in [facility] Cardiac and Pulmonary Rehab  Referring Provider Dr. Belia Heman       Encounter Date: 07/03/2023  Check In:  Session Check In - 07/03/23 1046       Check-In   Supervising physician immediately available to respond to emergencies See telemetry face sheet for immediately available ER MD    Location [facility]-Cardiac & Pulmonary Rehab    Staff Present Cora Collum, RN, BSN, CCRP;Meredith Jewel Baize, RN BSN;Joseph [place], Guinevere Ferrari, RN, [place]    Virtual Visit No    Medication changes reported     No    Fall or balance concerns reported    No    Warm-up and Cool-down Performed on first and last piece of equipment    Resistance Training Performed Yes    VAD Patient? No    PAD/SET Patient? No      Pain Assessment   Currently in Pain? No/denies                Social History   Tobacco Use  Smoking Status Never  Smokeless Tobacco Never  Tobacco Comments   Second hand smoke exposure    Goals Met:  Independence with exercise equipment Exercise tolerated well No report of concerns or symptoms today Strength training completed today  Goals Unmet:  Not Applicable  Comments: Pt able to follow exercise prescription today without complaint.  Will continue to monitor for progression.    Dr. Bethann Punches is Medical Director for [facility] Cardiac Rehabilitation.  Dr. Vida Rigger is Medical Director for [facility] Pulmonary Rehabilitation.

## 2023-07-04 ENCOUNTER — Ambulatory Visit: Payer: Medicare Other | Admitting: Internal Medicine

## 2023-07-04 ENCOUNTER — Ambulatory Visit: Payer: Medicare Other

## 2023-07-07 ENCOUNTER — Ambulatory Visit: Payer: Medicare Other

## 2023-07-09 ENCOUNTER — Encounter: Payer: Self-pay | Admitting: *Deleted

## 2023-07-09 ENCOUNTER — Ambulatory Visit: Payer: Medicare Other

## 2023-07-09 DIAGNOSIS — D860 Sarcoidosis of lung: Secondary | ICD-10-CM

## 2023-07-09 NOTE — Progress Notes (Signed)
Pulmonary Individual Treatment Plan  Patient Details  Name: Melissa [name] MRN: [id number] Date of Birth: 1951-06-01 Referring Provider:   Flowsheet Row Pulmonary Rehab from 04/28/2023 in [facility] Cardiac and Pulmonary Rehab  Referring Provider Dr. Belia Heman       Initial Encounter Date:  Flowsheet Row Pulmonary Rehab from 04/28/2023 in [facility] Cardiac and Pulmonary Rehab  Date 04/28/23       Visit Diagnosis: Sarcoidosis of lung (HCC)  Patient's Home Medications on Admission:  Current Outpatient Medications:    acetaminophen (TYLENOL) 650 MG CR tablet, Take 1,300 mg by mouth every 8 (eight) hours as needed for pain., Disp: , Rfl:    albuterol (VENTOLIN HFA) 108 (90 Base) MCG/ACT inhaler, INHALE 1-2 PUFFS BY MOUTH EVERY 6 HOURS AS NEEDED FOR WHEEZE OR SHORTNESS OF BREATH, Disp: 18 each, Rfl: 0   amLODipine (NORVASC) 10 MG tablet, Take 5 mg by mouth daily., Disp: , Rfl:    Ascorbic Acid (VITAMIN C) 1000 MG tablet, Take 1,000 mg by mouth daily., Disp: , Rfl:    aspirin EC 81 MG tablet, Take 1 tablet by mouth daily., Disp: , Rfl:    atorvastatin (LIPITOR) 40 MG tablet, Take 1 tablet (40 mg total) by mouth daily., Disp: 90 tablet, Rfl: 1   azelastine (ASTELIN) 0.1 % nasal spray, Place 2 sprays into both nostrils 2 (two) times daily. Use in each nostril as directed, Disp: 30 mL, Rfl: 12   budesonide (PULMICORT) 0.5 MG/2ML nebulizer solution, Take 2 mLs (0.5 mg total) by nebulization in the morning and at bedtime., Disp: 120 mL, Rfl: 6   calcium carbonate (TUMS - DOSED IN MG ELEMENTAL CALCIUM) 500 MG chewable tablet, Chew 500-1,000 mg by mouth daily as needed for indigestion or heartburn., Disp: , Rfl:    chlorhexidine (PERIDEX) 0.12 % solution, PLEASE SEE ATTACHED FOR DETAILED DIRECTIONS, Disp: , Rfl:    Cholecalciferol (D 1000) 25 MCG (1000 UT) capsule, Take 1,000 Units by mouth daily., Disp: , Rfl:    ezetimibe (ZETIA) 10 MG tablet, Take 1 tablet (10 mg total) by mouth daily., Disp: 90 tablet, Rfl:  1   ferrous sulfate 325 (65 FE) MG tablet, Take 325 mg by mouth daily with breakfast., Disp: , Rfl:    fluticasone (FLONASE) 50 MCG/ACT nasal spray, SPRAY 2 SPRAYS INTO EACH NOSTRIL EVERY DAY, Disp: 48 mL, Rfl: 1   gabapentin (NEURONTIN) 100 MG capsule, Take 100-200 mg by mouth 2 (two) times daily. One in am and two at night, Disp: , Rfl:    ipratropium-albuterol (DUONEB) 0.5-2.5 (3) MG/3ML SOLN, Take 3 mLs by nebulization every 6 (six) hours as needed., Disp: 360 mL, Rfl: 3   levocetirizine (XYZAL ALLERGY 24HR) 5 MG tablet, Take 1 tablet (5 mg total) by mouth every evening., Disp: 30 tablet, Rfl: 6   losartan (COZAAR) 25 MG tablet, TAKE 1 TABLET (25 MG TOTAL) BY MOUTH DAILY., Disp: 90 tablet, Rfl: 0   magnesium oxide (MAG-OX) 400 MG tablet, Take 400 mg by mouth daily., Disp: , Rfl:    methocarbamol (ROBAXIN) 500 MG tablet, Take 500 mg by mouth every 8 (eight) hours as needed for muscle spasms., Disp: , Rfl:    Multiple Vitamins-Minerals (CENTRUM SILVER) tablet, Take 1 tablet by mouth daily., Disp: 30 tablet, Rfl: 0   nortriptyline (PAMELOR) 10 MG capsule, Take 10-20 mg by mouth at bedtime., Disp: , Rfl:    Peppermint Oil (IBGARD) 90 MG CPCR, Take 2 capsules by mouth 2 (two) times daily., Disp: , Rfl:  Probiotic Product (FORTIFY PROBIOTIC WOMENS EX ST PO), Take by mouth daily., Disp: , Rfl:    propranolol (INDERAL) 10 MG tablet, Take 1 tablet (10 mg total) by mouth 3 (three) times daily as needed. For sustained fast heart rate, Disp: 60 tablet, Rfl: 3   TRELEGY ELLIPTA 200-62.5-25 MCG/ACT AEPB, INHALE ONE ACTUATION INTO THE LUNGS DAILY, Disp: 60 each, Rfl: 5   zinc gluconate 50 MG tablet, Take 50 mg by mouth daily., Disp: , Rfl:   Past Medical History: Past Medical History:  Diagnosis Date   Aortic atherosclerosis (HCC)    Asthma    Bruising    Cardiomyopathy (HCC)    Celiac artery aneurysm (HCC)    Cervical radiculopathy    CKD (chronic kidney disease), stage III (HCC)    Deficiency of  vitamin B    Depression    Edema    Flank pain    Gross hematuria    History of 2019 novel coronavirus disease (COVID-19) 05/01/2019   History of bariatric surgery    HLD (hyperlipidemia)    HTN (hypertension)    Interstitial lung disease (HCC)    Iron deficiency anemia    Leukopenia    OA (osteoarthritis)    Obesity    OSA on CPAP    Osteopenia    Perennial allergic rhinitis    Pneumonia due to COVID-19 virus 2020   Renal cysts, acquired, bilateral    RLS (restless legs syndrome)    Sarcoidosis    Sickle cell trait (HCC)    Snoring    Spinal stenosis    Trigeminal neuralgia     Tobacco Use: Social History   Tobacco Use  Smoking Status Never  Smokeless Tobacco Never  Tobacco Comments   Second hand smoke exposure    Labs: Review Flowsheet  More data exists      Latest Ref Rng & Units 10/18/2020 04/10/2021 04/10/2022 01/17/2023 01/18/2023  Labs for ITP Cardiac and Pulmonary Rehab  Cholestrol 0 - 200 mg/dL - 161  096  - 045   LDL (calc) 0 - 99 mg/dL - 94  57  - 54   HDL-C >40 mg/dL - 92  82  - 72   Trlycerides <150 mg/dL - 72  50  - 42   Hemoglobin A1c 4.8 - 5.6 % - - - 5.9  -  TCO2 22 - 32 mmol/L 25  - - - -    Details             Pulmonary Assessment Scores:  Pulmonary Assessment Scores     Row Name 04/28/23 1515         ADL UCSD   ADL Phase Entry     SOB Score total 14     Rest 0     Walk 0     Stairs 2     Bath 0     Dress 1     Shop 2       CAT Score   CAT Score 6       mMRC Score   mMRC Score 1              UCSD: Self-administered rating of dyspnea associated with activities of daily living (ADLs) 6-point scale (0 = "not at all" to 5 = "maximal or unable to do because of breathlessness")  Scoring Scores range from 0 to 120.  Minimally important difference is 5 units  CAT: CAT can identify the health impairment of COPD patients  and is better correlated with disease progression.  CAT has a scoring range of zero to 40. The CAT  score is classified into four groups of low (less than 10), medium (10 - 20), high (21-30) and very high (31-40) based on the impact level of disease on health status. A CAT score over 10 suggests significant symptoms.  A worsening CAT score could be explained by an exacerbation, poor medication adherence, poor inhaler technique, or progression of COPD or comorbid conditions.  CAT MCID is 2 points  mMRC: mMRC (Modified Medical Research Council) Dyspnea Scale is used to assess the degree of baseline functional disability in patients of respiratory disease due to dyspnea. No minimal important difference is established. A decrease in score of 1 point or greater is considered a positive change.   Pulmonary Function Assessment:  Pulmonary Function Assessment - 04/14/23 1018       Breath   Shortness of Breath No             Exercise Target Goals: Exercise Program Goal: Individual exercise prescription set using results from initial 6 min walk test and THRR while considering  patient's activity barriers and safety.   Exercise Prescription Goal: Initial exercise prescription builds to 30-45 minutes a day of aerobic activity, 2-3 days per week.  Home exercise guidelines will be given to patient during program as part of exercise prescription that the participant will acknowledge.  Education: Aerobic Exercise: - Group verbal and visual presentation on the components of exercise prescription. Introduces F.I.T.T principle from ACSM for exercise prescriptions.  Reviews F.I.T.T. principles of aerobic exercise including progression. Written material given at graduation. Flowsheet Row Pulmonary Rehab from 07/03/2023 in [facility] Cardiac and Pulmonary Rehab  Education need identified 04/28/23  Date 06/19/23  Educator MB  Instruction Review Code 1- [employer] Understanding       Education: Resistance Exercise: - Group verbal and visual presentation on the components of exercise prescription.  Introduces F.I.T.T principle from ACSM for exercise prescriptions  Reviews F.I.T.T. principles of resistance exercise including progression. Written material given at graduation. Flowsheet Row Pulmonary Rehab from 07/03/2023 in [facility] Cardiac and Pulmonary Rehab  Date 06/12/23  Educator MB  Instruction Review Code 1- [employer] Understanding        Education: Exercise & Equipment Safety: - Individual verbal instruction and demonstration of equipment use and safety with use of the equipment. Flowsheet Row Pulmonary Rehab from 07/03/2023 in [facility] Cardiac and Pulmonary Rehab  Date 04/28/23  Educator [facility]  Instruction Review Code 1- Verbalizes Understanding       Education: Exercise Physiology & General Exercise Guidelines: - Group verbal and written instruction with models to review the exercise physiology of the cardiovascular system and associated critical values. Provides general exercise guidelines with specific guidelines to those with heart or lung disease.  Flowsheet Row Pulmonary Rehab from 07/03/2023 in [facility] Cardiac and Pulmonary Rehab  Education need identified 04/28/23  Date 06/05/23  Educator MB  Instruction Review Code 1- [employer] Understanding       Education: Flexibility, Balance, Mind/Body Relaxation: - Group verbal and visual presentation with interactive activity on the components of exercise prescription. Introduces F.I.T.T principle from ACSM for exercise prescriptions. Reviews F.I.T.T. principles of flexibility and balance exercise training including progression. Also discusses the mind body connection.  Reviews various relaxation techniques to help reduce and manage stress (i.e. Deep breathing, progressive muscle relaxation, and visualization). Balance handout provided to take home. Written material given at graduation. Flowsheet Row Pulmonary Rehab from 07/03/2023 in  [facility] Cardiac and Pulmonary Rehab  Date 06/12/23  Educator MB  Instruction Review Code 1- Verbalizes  Understanding       Activity Barriers & Risk Stratification:  Activity Barriers & Cardiac Risk Stratification - 04/28/23 1502       Activity Barriers & Cardiac Risk Stratification   Activity Barriers Right Knee Replacement;Back Problems;Other (comment)    Comments left knee stiffness and some hip pain             6 Minute Walk:  6 Minute Walk     Row Name 04/28/23 1459         6 Minute Walk   Phase Initial     Distance 1290 feet     Walk Time 6 minutes     # of Rest Breaks 0     MPH 2.44     METS 3.45     RPE 13     Perceived Dyspnea  0     VO2 Peak 12.06     Symptoms No     Resting HR 93 bpm     Resting BP 122/72     Resting Oxygen Saturation  95 %     Exercise Oxygen Saturation  during 6 min walk 93 %     Max Ex. HR 130 bpm     Max Ex. BP 158/80     2 Minute Post BP 118/62       Interval HR   1 Minute HR 114     2 Minute HR 117     3 Minute HR 118     4 Minute HR 120     5 Minute HR 130     6 Minute HR 127     2 Minute Post HR 101     Interval Heart Rate? Yes       Interval Oxygen   Interval Oxygen? Yes     Baseline Oxygen Saturation % 95 %     1 Minute Oxygen Saturation % 98 %     1 Minute Liters of Oxygen 0 L     2 Minute Oxygen Saturation % 93 %     2 Minute Liters of Oxygen 0 L     3 Minute Oxygen Saturation % 94 %     3 Minute Liters of Oxygen 0 L     4 Minute Oxygen Saturation % 95 %     4 Minute Liters of Oxygen 0 L     5 Minute Oxygen Saturation % 93 %     5 Minute Liters of Oxygen 0 L     6 Minute Oxygen Saturation % 95 %     6 Minute Liters of Oxygen 0 L     2 Minute Post Oxygen Saturation % 94 %     2 Minute Post Liters of Oxygen 0 L             Oxygen Initial Assessment:  Oxygen Initial Assessment - 04/28/23 1514       Home Oxygen   Home Oxygen Device None    Sleep Oxygen Prescription CPAP    Liters per minute 0    Home Exercise Oxygen Prescription None    Home Resting Oxygen Prescription None    Compliance with  Home Oxygen Use Yes      Initial 6 min Walk   Oxygen Used None      Program Oxygen Prescription   Program Oxygen Prescription None  Intervention   Short Term Goals To learn and exhibit compliance with exercise, home and travel O2 prescription;To learn and understand importance of monitoring SPO2 with pulse oximeter and demonstrate accurate use of the pulse oximeter.;To learn and understand importance of maintaining oxygen saturations>88%;To learn and demonstrate proper pursed lip breathing techniques or other breathing techniques. ;To learn and demonstrate proper use of respiratory medications    Long  Term Goals Exhibits compliance with exercise, home  and travel O2 prescription;Maintenance of O2 saturations>88%;Compliance with respiratory medication;Demonstrates proper use of MDI's;Exhibits proper breathing techniques, such as pursed lip breathing or other method taught during program session;Verbalizes importance of monitoring SPO2 with pulse oximeter and return demonstration             Oxygen Re-Evaluation:  Oxygen Re-Evaluation     Row Name 06/05/23 1122 06/24/23 1115           Program Oxygen Prescription   Program Oxygen Prescription None None        Home Oxygen   Home Oxygen Device None None      Sleep Oxygen Prescription CPAP CPAP      Liters per minute 0 --      Home Exercise Oxygen Prescription -- None      Home Resting Oxygen Prescription None None      Compliance with Home Oxygen Use Yes Yes        Goals/Expected Outcomes   Short Term Goals To learn and demonstrate proper pursed lip breathing techniques or other breathing techniques.  To learn and demonstrate proper pursed lip breathing techniques or other breathing techniques. ;To learn and demonstrate proper use of respiratory medications;To learn and understand importance of maintaining oxygen saturations>88%      Long  Term Goals Exhibits proper breathing techniques, such as pursed lip breathing or other  method taught during program session Exhibits proper breathing techniques, such as pursed lip breathing or other method taught during program session;Compliance with respiratory medication;Maintenance of O2 saturations>88%      Comments Informed patient how to perform the Pursed Lipped breathing technique. Told patient to Inhale through the nose and out the mouth with pursed lips to keep their airways open, help oxygenate them better, practice when at rest or doing strenuous activity. Patient Verbalizes understanding of technique and will work on and be reiterated during [facility]. Dennie Bible reports doing well in the program. She has noticed an improvement in her shortness of breath and wants to continue to work on her breathing techniques while enrolled in the program. She is compliant with her CPAP use. She notes that some smells will exacerbate her asthma, like when she was cleaning her oven, so she is reminded to take care with those smells.      Goals/Expected Outcomes Short: use PLB with exertion. Long: use PLB on exertion proficiently and independently. Short: practice PLB and awareness on chemical smells. Long: independently manage breathing techniques               Oxygen Discharge (Final Oxygen Re-Evaluation):  Oxygen Re-Evaluation - 06/24/23 1115       Program Oxygen Prescription   Program Oxygen Prescription None      Home Oxygen   Home Oxygen Device None    Sleep Oxygen Prescription CPAP    Home Exercise Oxygen Prescription None    Home Resting Oxygen Prescription None    Compliance with Home Oxygen Use Yes      Goals/Expected Outcomes   Short Term Goals To learn and demonstrate  proper pursed lip breathing techniques or other breathing techniques. ;To learn and demonstrate proper use of respiratory medications;To learn and understand importance of maintaining oxygen saturations>88%    Long  Term Goals Exhibits proper breathing techniques, such as pursed lip breathing or other method  taught during program session;Compliance with respiratory medication;Maintenance of O2 saturations>88%    Comments Dennie Bible reports doing well in the program. She has noticed an improvement in her shortness of breath and wants to continue to work on her breathing techniques while enrolled in the program. She is compliant with her CPAP use. She notes that some smells will exacerbate her asthma, like when she was cleaning her oven, so she is reminded to take care with those smells.    Goals/Expected Outcomes Short: practice PLB and awareness on chemical smells. Long: independently manage breathing techniques             Initial Exercise Prescription:  Initial Exercise Prescription - 04/28/23 1500       Date of Initial Exercise RX and Referring Provider   Date 04/28/23    Referring Provider Dr. Belia Heman      Oxygen   Maintain Oxygen Saturation 88% or higher      Treadmill   MPH 3    Grade 0    Minutes 15    METs 3.5      NuStep   Level 2   T6   SPM 80    Minutes 15    METs 3.45      Elliptical   Level 1    Speed 2.5    Minutes 15    METs 3.45      REL-XR   Level 2    Speed 50    Minutes 15    METs 3.45      T5 Nustep   Level 2    SPM 80    Minutes 15    METs 3.45      Prescription Details   Frequency (times per week) 3    Duration Progress to 30 minutes of continuous aerobic without signs/symptoms of physical distress      Intensity   THRR 40-80% of Max Heartrate 115-138    Ratings of Perceived Exertion 11-13    Perceived Dyspnea 0-4      Progression   Progression Continue to progress workloads to maintain intensity without signs/symptoms of physical distress.      Resistance Training   Training Prescription Yes    Weight 4    Reps 10-15             Perform Capillary Blood Glucose checks as needed.  Exercise Prescription Changes:   Exercise Prescription Changes     Row Name 04/28/23 1500 05/08/23 1600 05/22/23 1700 06/05/23 1400 06/19/23 1300      Response to Exercise   Blood Pressure (Admit) 122/72 112/60 122/62 134/70 122/62   Blood Pressure (Exercise) 158/80 116/74 170/70 160/70 158/60   Blood Pressure (Exit) 118/62 110/60 102/60 110/70 108/52   Heart Rate (Admit) 93 bpm 112 bpm 78 bpm 82 bpm 94 bpm   Heart Rate (Exercise) 130 bpm 128 bpm 132 bpm 132 bpm 124 bpm   Heart Rate (Exit) 101 bpm 113 bpm 126 bpm 105 bpm 130 bpm   Oxygen Saturation (Admit) 95 % 95 % 99 % 98 % 92 %   Oxygen Saturation (Exercise) 93 % 90 % 90 % 81 % 88 %   Oxygen Saturation (Exit) 94 % 98 % 95 %  97 % 92 %   Rating of Perceived Exertion (Exercise) 13 13 15 15 12    Perceived Dyspnea (Exercise) 0 0 0 1 3   Symptoms none none none none none   Comments 6 MWT results First week of exercise -- -- --   Duration -- Progress to 30 minutes of  aerobic without signs/symptoms of physical distress Progress to 30 minutes of  aerobic without signs/symptoms of physical distress Progress to 30 minutes of  aerobic without signs/symptoms of physical distress Progress to 30 minutes of  aerobic without signs/symptoms of physical distress   Intensity -- THRR unchanged THRR unchanged THRR unchanged THRR unchanged     Progression   Progression -- Continue to progress workloads to maintain intensity without signs/symptoms of physical distress. Continue to progress workloads to maintain intensity without signs/symptoms of physical distress. Continue to progress workloads to maintain intensity without signs/symptoms of physical distress. Continue to progress workloads to maintain intensity without signs/symptoms of physical distress.   Average METs -- 3.72 3.96 3.8 3.31     Resistance Training   Training Prescription -- Yes Yes Yes Yes   Weight -- 4 4 5  lb 5 lb   Reps -- 10-15 10-15 10-15 10-15     Interval Training   Interval Training -- No No No No     Treadmill   MPH -- 2.8 3 3 3    Grade -- 1.5 0.5 0.5 0.5   Minutes -- 15 15 15 15    METs -- 3.72 3.5 3.5 3.5     Recumbant  Bike   Level -- -- -- -- 3   Watts -- -- -- -- 22   Minutes -- -- -- -- 15   METs -- -- -- -- 2.73     NuStep   Level -- 5 5 -- --   Minutes -- 15 15 -- --   METs -- 4.2 4.2 -- --     Elliptical   Level -- -- 1 1  10  min 1   Speed -- -- 2.5 2.5 4.3   Minutes -- -- 15 15 10      REL-XR   Level -- -- 5 5 --   Minutes -- -- 15 15 --   METs -- -- 5.6 7.1 --     T5 Nustep   Level -- 2 2 3 3    Minutes -- 15 15 15 15    METs -- -- -- 3.1 3.1     Oxygen   Maintain Oxygen Saturation -- 88% or higher 88% or higher 88% or higher 88% or higher    Row Name 07/03/23 0900             Response to Exercise   Blood Pressure (Admit) 122/62       Blood Pressure (Exit) 110/62       Heart Rate (Admit) 103 bpm       Heart Rate (Exercise) 152 bpm       Heart Rate (Exit) 115 bpm       Oxygen Saturation (Admit) 97 %       Oxygen Saturation (Exercise) 92 %       Oxygen Saturation (Exit) 97 %       Rating of Perceived Exertion (Exercise) 15       Perceived Dyspnea (Exercise) 1       Symptoms none       Duration Continue with 30 min of aerobic exercise without signs/symptoms of physical distress.  Intensity THRR unchanged         Progression   Progression Continue to progress workloads to maintain intensity without signs/symptoms of physical distress.       Average METs 3.48         Resistance Training   Training Prescription Yes       Weight 5 lb       Reps 10-15         Interval Training   Interval Training No         Treadmill   MPH 3       Grade 0.5       Minutes 15       METs 3.5         NuStep   Level 3       Minutes 15       METs 3.1         Elliptical   Level 1       Speed 4.4       Minutes 15         REL-XR   Level 5       Minutes 15         Biostep-RELP   Level 1       Minutes 15       METs 4         Oxygen   Maintain Oxygen Saturation 88% or higher                Exercise Comments:   Exercise Goals and Review:   Exercise Goals      Row Name 04/28/23 1509             Exercise Goals   Increase Physical Activity Yes       Intervention Provide advice, education, support and counseling about physical activity/exercise needs.;Develop an individualized exercise prescription for aerobic and resistive training based on initial evaluation findings, risk stratification, comorbidities and participant's personal goals.       Expected Outcomes Short Term: Attend rehab on a regular basis to increase amount of physical activity.;Long Term: Add in home exercise to make exercise part of routine and to increase amount of physical activity.;Long Term: Exercising regularly at least 3-5 days a week.       Increase Strength and Stamina Yes       Intervention Provide advice, education, support and counseling about physical activity/exercise needs.;Develop an individualized exercise prescription for aerobic and resistive training based on initial evaluation findings, risk stratification, comorbidities and participant's personal goals.       Expected Outcomes Short Term: Increase workloads from initial exercise prescription for resistance, speed, and METs.;Short Term: Perform resistance training exercises routinely during rehab and add in resistance training at home;Long Term: Improve cardiorespiratory fitness, muscular endurance and strength as measured by increased METs and functional capacity ( )       Able to understand and use rate of perceived exertion (RPE) scale Yes       Intervention Provide education and explanation on how to use RPE scale       Expected Outcomes Short Term: Able to use RPE daily in rehab to express subjective intensity level;Long Term:  Able to use RPE to guide intensity level when exercising independently       Able to understand and use Dyspnea scale Yes       Intervention Provide education and explanation on how to use Dyspnea scale       Expected Outcomes Short  Term: Able to use Dyspnea scale daily in rehab to  express subjective sense of shortness of breath during exertion;Long Term: Able to use Dyspnea scale to guide intensity level when exercising independently       Knowledge and understanding of Target Heart Rate Range (THRR) Yes       Intervention Provide education and explanation of THRR including how the numbers were predicted and where they are located for reference       Expected Outcomes Short Term: Able to state/look up THRR;Long Term: Able to use THRR to govern intensity when exercising independently;Short Term: Able to use daily as guideline for intensity in rehab       Able to check pulse independently Yes       Intervention Provide education and demonstration on how to check pulse in carotid and radial arteries.;Review the importance of being able to check your own pulse for safety during independent exercise       Expected Outcomes Short Term: Able to explain why pulse checking is important during independent exercise;Long Term: Able to check pulse independently and accurately       Understanding of Exercise Prescription Yes       Intervention Provide education, explanation, and written materials on patient's individual exercise prescription       Expected Outcomes Short Term: Able to explain program exercise prescription;Long Term: Able to explain home exercise prescription to exercise independently                Exercise Goals Re-Evaluation :  Exercise Goals Re-Evaluation     Row Name 05/08/23 1647 05/22/23 1712 06/05/23 1443 06/19/23 1343 06/24/23 1134     Exercise Goal Re-Evaluation   Exercise Goals Review Increase Physical Activity;Increase Strength and Stamina;Understanding of Exercise Prescription Increase Physical Activity;Increase Strength and Stamina;Understanding of Exercise Prescription Increase Physical Activity;Increase Strength and Stamina;Understanding of Exercise Prescription Increase Physical Activity;Increase Strength and Stamina;Understanding of Exercise  Prescription Increase Physical Activity   Comments Ketty is off to a good start in the program. She attended 2 sessions during this review, and was able to use the treadmill, T4, and T5 nustep. We will continue to monitor her progress in the program. Dennie Bible continues to make improvements at rehab. She has recently increased her level on the XR from 2 to 5. She has also added 0.5% incline to her prescribed on the treadmill. We will continue to monitor her progress in the program. Dennie Bible continues to do well in rehab. She has been able to increase her hand weights from 4 to 5lb, and increase her level on the T5 nustep from level 2 to 3. She has also been able to maintain a consistent workload on the treadmill, and continues to try to use the elliptical for the full 20 minutes. We will continue to monitor her progress in the program. Dennie Bible continues to do well in rehab. She has recently been able to increase her speed on the elliptical from 2.5 to 4.3 mph. She has also been able to maintain a consistent intensity on the treadmill and T5 nustep. We will continue to monitor her progress in the program. [facility] been a part of this class for three years and really enjoys the challenge and community. She continues to increase her levels on her machines every few sessions.   Expected Outcomes Short: Continue to follow current exercise prescription. Long: Continue exercise to improve strength and stamina. Short: Continue  to follow current exercise prescription. Long: Continue exercise to improve strength and stamina. Short: Continue to work to improve time on elliptical. Long: Continue exercise to improve strength and stamina. Short: Continue to work to improve time on elliptical. Long: Continue exercise to improve strength and stamina. Short: continue to attend water aerobics and the program consistenlty. Long: independently manage exercise consistently.    Row Name  07/03/23 0913             Exercise Goal Re-Evaluation   Exercise Goals Review Increase Physical Activity;Increase Strength and Stamina;Knowledge and understanding of Target Heart Rate Range (THRR)       Comments Dennie Bible is doing well in rehab. She has stayed consistent with her treadmill workload at a speed of 3 mph with an incline of 0.5%. She also was able to do the elliptical for 15 minutes after previously only being able to do 10 minutes. She has stayed consistent with her workloads on seated machines as well. We will continue to monitor her progress in the program.       Expected Outcomes Short: Continue to progressively increase treadmill workload. Long: Continue exercise to improve strength and stamina.                Discharge Exercise Prescription (Final Exercise Prescription Changes):  Exercise Prescription Changes - 07/03/23 0900       Response to Exercise   Blood Pressure (Admit) 122/62    Blood Pressure (Exit) 110/62    Heart Rate (Admit) 103 bpm    Heart Rate (Exercise) 152 bpm    Heart Rate (Exit) 115 bpm    Oxygen Saturation (Admit) 97 %    Oxygen Saturation (Exercise) 92 %    Oxygen Saturation (Exit) 97 %    Rating of Perceived Exertion (Exercise) 15    Perceived Dyspnea (Exercise) 1    Symptoms none    Duration Continue with 30 min of aerobic exercise without signs/symptoms of physical distress.    Intensity THRR unchanged      Progression   Progression Continue to progress workloads to maintain intensity without signs/symptoms of physical distress.    Average METs 3.48      Resistance Training   Training Prescription Yes    Weight 5 lb    Reps 10-15      Interval Training   Interval Training No      Treadmill   MPH 3    Grade 0.5    Minutes 15    METs 3.5      NuStep   Level 3    Minutes 15    METs 3.1      Elliptical   Level 1    Speed 4.4    Minutes 15      REL-XR   Level 5    Minutes 15      Biostep-RELP   Level 1    Minutes 15     METs 4      Oxygen   Maintain Oxygen Saturation 88% or higher             Nutrition:  Target Goals: Understanding of nutrition guidelines, daily intake of sodium 1500mg , cholesterol 200mg , calories 30% from fat and 7% or less from saturated fats, daily to have 5 or more servings of fruits and vegetables.  Education: All About Nutrition: -Group instruction provided by verbal, written material, interactive activities, discussions, models, and posters to present general guidelines for heart healthy nutrition including fat, fiber, MyPlate, the  role of sodium in heart healthy nutrition, utilization of the nutrition label, and utilization of this knowledge for meal planning. Follow up email sent as well. Written material given at graduation. Flowsheet Row Pulmonary Rehab from 07/03/2023 in [facility] Cardiac and Pulmonary Rehab  Education need identified 04/28/23  Date 07/03/23  Educator JG  Instruction Review Code 1- Verbalizes Understanding       Biometrics:  Pre Biometrics - 04/28/23 1511       Pre Biometrics   Height 6' (1.829 m)    Weight 210 lb (95.3 kg)    Waist Circumference 36 inches    Hip Circumference 47 inches    Waist to Hip Ratio 0.77 %    BMI (Calculated) 28.47    Single Leg Stand 9.43 seconds              Nutrition Therapy Plan and Nutrition Goals:  Nutrition Therapy & Goals - 04/28/23 1511       Nutrition Therapy   Diet Cardiac, Low Na    Protein (specify units) 90    Fiber 25 grams    Whole Grain Foods 3 servings    Saturated Fats 15 max. grams    Fruits and Vegetables 5 servings/day    Sodium 2 grams      Personal Nutrition Goals   Nutrition Goal Eat a small but nutrient dense breakfast most days the week    Personal Goal #2 Eat a protein and carb at each meal    Personal Goal #3 Cut back on sweets, look for healthier snacks    Comments Patient drinking water and coffee only. She reports she often skips lunch due to eating breakfast late,  around 11am. She reports she wakes up early and goes to water aerobics without eating breakfast. Brainstormed things to make breakfast more achievable for her, focus was on smaller portions, quick to make, grab and go friendly. Educated on what makes a good meal and snack. Set goal to pair protein and carbs together at each meal. She finds herself snacking on sweets often, talked about better snacks to try more often, reducing the frequency of these sugary foods. Reviewed mediterranean diet handout, types of fats, sources, how to read label. Built out several meals and snacks with foods she likes and will eat. Focus on smaller portions, healthy fats and reducing sugar intake from poor food choices. She is very optimistic about making changes to her eating structure and will work on consistency.      Intervention Plan   Intervention Prescribe, educate and counsel regarding individualized specific dietary modifications aiming towards targeted core components such as weight, hypertension, lipid management, diabetes, heart failure and other comorbidities.;Nutrition handout(s) given to patient.    Expected Outcomes Long Term Goal: Adherence to prescribed nutrition plan.;Short Term Goal: A plan has been developed with personal nutrition goals set during dietitian appointment.;Short Term Goal: Understand basic principles of dietary content, such as calories, fat, sodium, cholesterol and nutrients.             Nutrition Assessments:  MEDIFICTS Score Key: >=70 Need to make dietary changes  40-70 Heart Healthy Diet <= 40 Therapeutic Level Cholesterol Diet  Flowsheet Row Pulmonary Rehab from 04/28/2023 in [facility] Cardiac and Pulmonary Rehab  Picture Your Plate Total Score on Admission 48      Picture Your Plate Scores: <16 Unhealthy dietary pattern with much room for improvement. 41-50 Dietary pattern unlikely to meet recommendations for good health and room for improvement. 51-60 More healthful  dietary  pattern, with some room for improvement.  >60 Healthy dietary pattern, although there may be some specific behaviors that could be improved.   Nutrition Goals Re-Evaluation:  Nutrition Goals Re-Evaluation     Row Name 06/05/23 1124 06/24/23 1131           Goals   Current Weight 256 lb (116.1 kg) --      Comment Patient was informed on why it is important to maintain a balanced diet when dealing with Respiratory issues. Explained that it takes a lot of energy to breath and when they are short of breath often they will need to have a good diet to help keep up with the calories they are expending for breathing. Dennie Bible reports that she has been trying to incoporate ideas from her meeting with the RD. She states she has cravings for sugar and carbs but she has just not been buying her typical snacks and she is strengthening her self will to resist over eating.      Expected Outcome Short: Choose and plan snacks accordingly to patients caloric intake to improve breathing. Long: Maintain a diet independently that meets their caloric intake to aid in daily shortness of breath. Short: continue to incoporate healthy snacks into her day to day planning. Long: independenty manage healthy eating.               Nutrition Goals Discharge (Final Nutrition Goals Re-Evaluation):  Nutrition Goals Re-Evaluation - 06/24/23 1131       Goals   Comment Pat reports that she has been trying to incoporate ideas from her meeting with the RD. She states she has cravings for sugar and carbs but she has just not been buying her typical snacks and she is strengthening her self will to resist over eating.    Expected Outcome Short: continue to incoporate healthy snacks into her day to day planning. Long: independenty manage healthy eating.             Psychosocial: Target Goals: Acknowledge presence or absence of significant depression and/or stress, maximize coping skills, provide positive support system.  Participant is able to verbalize types and ability to use techniques and skills needed for reducing stress and depression.   Education: Stress, Anxiety, and Depression - Group verbal and visual presentation to define topics covered.  Reviews how body is impacted by stress, anxiety, and depression.  Also discusses healthy ways to reduce stress and to treat/manage anxiety and depression.  Written material given at graduation. Flowsheet Row Pulmonary Rehab from 07/03/2023 in [facility] Cardiac and Pulmonary Rehab  Date 05/29/23  Educator SB  Instruction Review Code 1- [employer] Understanding       Education: Sleep Hygiene -Provides group verbal and written instruction about how sleep can affect your health.  Define sleep hygiene, discuss sleep cycles and impact of sleep habits. Review good sleep hygiene tips.    Initial Review & Psychosocial Screening:  Initial Psych Review & Screening - 04/14/23 1020       Initial Review   Current issues with None Identified;Current Anxiety/Panic;History of Depression      Family Dynamics   Good Support System? Yes    Comments SHe has a good support system. She can look to her sons for support and her youngest son lives with her. Sometiems she has anxiet over the normal stresses of life but nothing significant.      Barriers   Psychosocial barriers to participate in program The patient should benefit from training  in stress management and relaxation.;There are no identifiable barriers or psychosocial needs.      Screening Interventions   Interventions Encouraged to exercise;To provide support and resources with identified psychosocial needs;Provide feedback about the scores to participant    Expected Outcomes Short Term goal: Utilizing psychosocial counselor, staff and physician to assist with identification of specific Stressors or current issues interfering with healing process. Setting desired goal for each stressor or current issue identified.;Long Term  Goal: Stressors or current issues are controlled or eliminated.;Short Term goal: Identification and review with participant of any Quality of Life or Depression concerns found by scoring the questionnaire.;Long Term goal: The participant improves quality of Life and PHQ9 Scores as seen by post scores and/or verbalization of changes             Quality of Life Scores:  Scores of 19 and below usually indicate a poorer quality of life in these areas.  A difference of  2-3 points is a clinically meaningful difference.  A difference of 2-3 points in the total score of the Quality of Life Index has been associated with significant improvement in overall quality of life, self-image, physical symptoms, and general health in studies assessing change in quality of life.  PHQ-9: Review Flowsheet  More data exists      04/28/2023 03/25/2023 03/14/2023 11/19/2022 10/31/2022  Depression screen PHQ 2/9  Decreased Interest 0 0 0 0 0  Down, Depressed, Hopeless 0 0 0 0 0  PHQ - 2 Score 0 0 0 0 0  Altered sleeping 0 0 0 0 0  Tired, decreased energy 1 0 0 1 1  Change in appetite 0 0 0 0 0  Feeling bad or failure about yourself  0 0 0 0 0  Trouble concentrating 1 0 0 0 0  Moving slowly or fidgety/restless 0 0 0 0 0  Suicidal thoughts 0 0 0 0 0  PHQ-9 Score 2 0 0 1 1  Difficult doing work/chores Not difficult at all - - - -    Details           Interpretation of Total Score  Total Score Depression Severity:  1-4 = Minimal depression, 5-9 = Mild depression, 10-14 = Moderate depression, 15-19 = Moderately severe depression, 20-27 = Severe depression   Psychosocial Evaluation and Intervention:  Psychosocial Evaluation - 04/14/23 1022       Psychosocial Evaluation & Interventions   Interventions Encouraged to exercise with the program and follow exercise prescription;Relaxation education;Stress management education    Comments SHe has a good support system. She can look to her sons for support and her  youngest son lives with her. Sometiems she has anxiet over the normal stresses of life but nothing significant.    Expected Outcomes Short: Start [facility] to help with mood. Long: Maintain a healthy mental state.    Continue Psychosocial Services  Follow up required by staff             Psychosocial Re-Evaluation:  Psychosocial Re-Evaluation     Row Name 06/05/23 1126 06/24/23 1123           Psychosocial Re-Evaluation   Current issues with Current Psychotropic Meds;Current Anxiety/Panic;History of Depression Current Psychotropic Meds;Current Anxiety/Panic;History of Depression      Comments Dennie Bible has some anxiety and takes medication for it. In the last few weeks she has not had any mood changes. She reports no issues with their current mental states, sleep, stress, depression or anxiety. Will follow up  with patient in a few weeks for any changes. Dennie Bible is meeting with her therapist regularly to help manage her depression and anxiety. She states she has had two family members that have caused an increase in stress and she thinks that they caused a lot of issues related to her blood pressure. Now that they both are no longer involved in her life, she notes that her pressure has been much better and they have taken her off her blood pressure medications. Her therapist has helped her talk through the issues with her sister that is one of the family members that have caused her stress. They were supposed to go on a cruise together in a few weeks, but her sister decided not to go. Dennie Bible decided to continue with the cruise plans and this is her first solo time traveling. She is really excited about her trip and her kids are proud of her for planning on going. She continues to attend her water aerobics class that she has been a part of for the last three years and has talked to another patient about joining her. She really enjoys the community of friends and how it makes her feel. Dennie Bible wants to continue to  take care of her health because she said she had kids later in life and wants to be healthy when she has grandkids      Expected Outcomes Short: Continue to exercise regularly to support mental health and notify staff of any changes. Long: maintain mental health and well being through teaching of rehab or prescribed medications independently. Short: continue to attend the program to increase her stamina and positively influence her mental health. Long; maintain positive self care habits.      Interventions Encouraged to attend Pulmonary Rehabilitation for the exercise Encouraged to attend Pulmonary Rehabilitation for the exercise      Continue Psychosocial Services  Follow up required by staff Follow up required by staff               Psychosocial Discharge (Final Psychosocial Re-Evaluation):  Psychosocial Re-Evaluation - 06/24/23 1123       Psychosocial Re-Evaluation   Current issues with Current Psychotropic Meds;Current Anxiety/Panic;History of Depression    Comments Dennie Bible is meeting with her therapist regularly to help manage her depression and anxiety. She states she has had two family members that have caused an increase in stress and she thinks that they caused a lot of issues related to her blood pressure. Now that they both are no longer involved in her life, she notes that her pressure has been much better and they have taken her off her blood pressure medications. Her therapist has helped her talk through the issues with her sister that is one of the family members that have caused her stress. They were supposed to go on a cruise together in a few weeks, but her sister decided not to go. Dennie Bible decided to continue with the cruise plans and this is her first solo time traveling. She is really excited about her trip and her kids are proud of her for planning on going. She continues to attend her water aerobics class that she has been a part of for the last three years and has talked to another  patient about joining her. She really enjoys the community of friends and how it makes her feel. Dennie Bible wants to continue to take care of her health because she said she had kids later in life and wants to be healthy when  she has grandkids    Expected Outcomes Short: continue to attend the program to increase her stamina and positively influence her mental health. Long; maintain positive self care habits.    Interventions Encouraged to attend Pulmonary Rehabilitation for the exercise    Continue Psychosocial Services  Follow up required by staff             Education: Education Goals: Education classes will be provided on a weekly basis, covering required topics. Participant will state understanding/return demonstration of topics presented.  Learning Barriers/Preferences:  Learning Barriers/Preferences - 04/14/23 1018       Learning Barriers/Preferences   Learning Barriers None    Learning Preferences None             General Pulmonary Education Topics:  Infection Prevention: - Provides verbal and written material to individual with discussion of infection control including proper hand washing and proper equipment cleaning during exercise session. Flowsheet Row Pulmonary Rehab from 07/03/2023 in [facility] Cardiac and Pulmonary Rehab  Date 04/28/23  Educator [facility]  Instruction Review Code 1- Verbalizes Understanding       Falls Prevention: - Provides verbal and written material to individual with discussion of falls prevention and safety. Flowsheet Row Pulmonary Rehab from 07/03/2023 in [facility] Cardiac and Pulmonary Rehab  Date 04/28/23  Educator [facility]  Instruction Review Code 1- Verbalizes Understanding       Chronic Lung Disease Review: - Group verbal instruction with posters, models, PowerPoint presentations and videos,  to review new updates, new respiratory medications, new advancements in procedures and treatments. Providing information on websites and "800" numbers for  continued self-education. Includes information about supplement oxygen, available portable oxygen systems, continuous and intermittent flow rates, oxygen safety, concentrators, and Medicare reimbursement for oxygen. Explanation of Pulmonary Drugs, including class, frequency, complications, importance of spacers, rinsing mouth after steroid MDI's, and proper cleaning methods for nebulizers. Review of basic lung anatomy and physiology related to function, structure, and complications of lung disease. Review of risk factors. Discussion about methods for diagnosing sleep apnea and types of masks and machines for OSA. Includes a review of the use of types of environmental controls: home humidity, furnaces, filters, dust mite/pet prevention, HEPA vacuums. Discussion about weather changes, air quality and the benefits of nasal washing. Instruction on Warning signs, infection symptoms, calling MD promptly, preventive modes, and value of vaccinations. Review of effective airway clearance, coughing and/or vibration techniques. Emphasizing that all should Create an Action Plan. Written material given at graduation. Flowsheet Row Pulmonary Rehab from 07/03/2023 in [facility] Cardiac and Pulmonary Rehab  Education need identified 04/28/23  Date 05/15/23  Educator [facility]  Instruction Review Code 1- Verbalizes Understanding       AED/CPR: - Group verbal and written instruction with the use of models to demonstrate the basic use of the AED with the basic ABC's of resuscitation.    Anatomy and Cardiac Procedures: - Group verbal and visual presentation and models provide information about basic cardiac anatomy and function. Reviews the testing methods done to diagnose heart disease and the outcomes of the test results. Describes the treatment choices: Medical Management, Angioplasty, or Coronary Bypass Surgery for treating various heart conditions including Myocardial Infarction, Angina, Valve Disease, and Cardiac Arrhythmias.   Written material given at graduation.   Medication Safety: - Group verbal and visual instruction to review commonly prescribed medications for heart and lung disease. Reviews the medication, class of the drug, and side effects. Includes the steps to properly store meds and maintain the  prescription regimen.  Written material given at graduation.   Other: -Provides group and verbal instruction on various topics (see comments)   Knowledge Questionnaire Score:  Knowledge Questionnaire Score - 04/28/23 1513       Knowledge Questionnaire Score   Pre Score 11/18              Core Components/Risk Factors/Patient Goals at Admission:  Personal Goals and Risk Factors at Admission - 04/28/23 1512       Core Components/Risk Factors/Patient Goals on Admission    Weight Management Yes;Weight Loss    Intervention Weight Management/Obesity: Establish reasonable short term and long term weight goals.;Weight Management: Provide education and appropriate resources to help participant work on and attain dietary goals.;Weight Management: Develop a combined nutrition and exercise program designed to reach desired caloric intake, while maintaining appropriate intake of nutrient and fiber, sodium and fats, and appropriate energy expenditure required for the weight goal.    Admit Weight 210 lb (95.3 kg)    Goal Weight: Short Term 200 lb (90.7 kg)    Goal Weight: Long Term 180 lb (81.6 kg)    Expected Outcomes Short Term: Continue to assess and modify interventions until short term weight is achieved;Long Term: Adherence to nutrition and physical activity/exercise program aimed toward attainment of established weight goal;Weight Loss: Understanding of general recommendations for a balanced deficit meal plan, which promotes 1-2 lb weight loss per week and includes a negative energy balance of [phone] kcal/d;Understanding recommendations for meals to include 15-35% energy as protein, 25-35% energy from fat,  35-60% energy from carbohydrates, less than 200mg  of dietary cholesterol, 20-35 gm of total fiber daily;Understanding of distribution of calorie intake throughout the day with the consumption of 4-5 meals/snacks    Improve shortness of breath with ADL's Yes    Intervention Provide education, individualized exercise plan and daily activity instruction to help decrease symptoms of SOB with activities of daily living.    Expected Outcomes Short Term: Improve cardiorespiratory fitness to achieve a reduction of symptoms when performing ADLs;Long Term: Be able to perform more ADLs without symptoms or delay the onset of symptoms    Hypertension Yes    Intervention Provide education on lifestyle modifcations including regular physical activity/exercise, weight management, moderate sodium restriction and increased consumption of fresh fruit, vegetables, and low fat dairy, alcohol moderation, and smoking cessation.;Monitor prescription use compliance.    Expected Outcomes Short Term: Continued assessment and intervention until BP is < 140/5mm HG in hypertensive participants. < 130/64mm HG in hypertensive participants with diabetes, heart failure or chronic kidney disease.;Long Term: Maintenance of blood pressure at goal levels.    Lipids Yes    Intervention Provide education and support for participant on nutrition & aerobic/resistive exercise along with prescribed medications to achieve LDL 70mg , HDL >40mg .    Expected Outcomes Short Term: Participant states understanding of desired cholesterol values and is compliant with medications prescribed. Participant is following exercise prescription and nutrition guidelines.;Long Term: Cholesterol controlled with medications as prescribed, with individualized exercise RX and with personalized nutrition plan. Value goals: LDL < 70mg , HDL > 40 mg.             Education:Diabetes - Individual verbal and written instruction to review signs/symptoms of diabetes,  desired ranges of glucose level fasting, after meals and with exercise. Acknowledge that pre and post exercise glucose checks will be done for 3 sessions at entry of program.   Know Your Numbers and Heart Failure: - Group verbal and visual instruction  to discuss disease risk factors for cardiac and pulmonary disease and treatment options.  Reviews associated critical values for Overweight/Obesity, Hypertension, Cholesterol, and Diabetes.  Discusses basics of heart failure: signs/symptoms and treatments.  Introduces Heart Failure Zone chart for action plan for heart failure.  Written material given at graduation. Flowsheet Row Pulmonary Rehab from 07/03/2023 in [facility] Cardiac and Pulmonary Rehab  Education need identified 04/28/23  Date 05/22/23  Educator SB  Instruction Review Code 1- Verbalizes Understanding       Core Components/Risk Factors/Patient Goals Review:   Goals and Risk Factor Review     Row Name 06/05/23 1123 06/24/23 1120           Core Components/Risk Factors/Patient Goals Review   Personal Goals Review Improve shortness of breath with ADL's Hypertension;Improve shortness of breath with ADL's      Review Spoke to patient about their shortness of breath and what they can do to improve. Patient has been informed of breathing techniques when starting the program. Patient is informed to tell staff if they have had any med changes and that certain meds they are taking or not taking can be causing shortness of breath. Pat's blood pressure medication was stopped. She notes that her pressures have been stable since then. She thinks a lot of her blood pressure issues were coming from family stressors and now that those have been removed, her blood pressure has been more stable. She is encouraged to check her blood pressure regularly with her home blood pressure monitor. She has been working on her breathing techniques while in the program and has noted some increase her stamina. She is  hopeful that coming regularly and continuing to do her water aerobics will help keep her motivated to keep up her hard work.      Expected Outcomes Short: Attend [facility] regularly to improve shortness of breath with ADL's. Long: maintain independence with ADL's Short: attend the program consistently to work on her blood pressure managment and breathing techniques. Long: independently manage her risk factors               Core Components/Risk Factors/Patient Goals at Discharge (Final Review):   Goals and Risk Factor Review - 06/24/23 1120       Core Components/Risk Factors/Patient Goals Review   Personal Goals Review Hypertension;Improve shortness of breath with ADL's    Review Pat's blood pressure medication was stopped. She notes that her pressures have been stable since then. She thinks a lot of her blood pressure issues were coming from family stressors and now that those have been removed, her blood pressure has been more stable. She is encouraged to check her blood pressure regularly with her home blood pressure monitor. She has been working on her breathing techniques while in the program and has noted some increase her stamina. She is hopeful that coming regularly and continuing to do her water aerobics will help keep her motivated to keep up her hard work.    Expected Outcomes Short: attend the program consistently to work on her blood pressure managment and breathing techniques. Long: independently manage her risk factors             ITP Comments:  ITP Comments     Row Name 04/14/23 1016 04/28/23 1458 05/21/23 0952 06/18/23 0838 07/09/23 1128   ITP Comments Virtual Visit completed. Patient informed on EP and RD appointment and 6 Minute walk test. Patient also informed of patient health questionnaires on My Chart. Patient Verbalizes understanding.  Visit diagnosis can be found in [facility] 09/30/2022. Completed and gym orientation. Initial ITP created and sent for review to Dr.  Jinny Sanders, Medical Director. 30 Day review completed. Medical Director ITP review done, changes made as directed, and signed approval by Medical Director.    new to program 30 Day review completed. Medical Director ITP review done, changes made as directed, and signed approval by Medical Director. 30 Day review completed. Medical Director ITP review done, changes made as directed, and signed approval by Medical Director.            Comments:

## 2023-07-11 ENCOUNTER — Ambulatory Visit: Payer: Medicare Other

## 2023-07-14 ENCOUNTER — Ambulatory Visit: Payer: Medicare Other

## 2023-07-14 ENCOUNTER — Other Ambulatory Visit: Payer: Self-pay | Admitting: Family Medicine

## 2023-07-14 ENCOUNTER — Other Ambulatory Visit: Payer: Self-pay | Admitting: Internal Medicine

## 2023-07-14 DIAGNOSIS — D869 Sarcoidosis, unspecified: Secondary | ICD-10-CM

## 2023-07-14 DIAGNOSIS — J4521 Mild intermittent asthma with (acute) exacerbation: Secondary | ICD-10-CM

## 2023-07-14 DIAGNOSIS — I728 Aneurysm of other specified arteries: Secondary | ICD-10-CM

## 2023-07-14 DIAGNOSIS — I700 Atherosclerosis of aorta: Secondary | ICD-10-CM

## 2023-07-16 ENCOUNTER — Ambulatory Visit: Payer: Medicare Other

## 2023-07-16 NOTE — Progress Notes (Unsigned)
Name: Melissa [name]   MRN: [id number]    DOB: 23-Oct-1950   Date:07/16/2023       Progress Note  Subjective  Chief Complaint  No chief complaint on file.   HPI  *** Patient Active Problem List   Diagnosis Date Noted   Interstitial lung disease due to granulomatous disease (HCC) 03/25/2023   Cardiomyopathy, unspecified type (HCC) 03/25/2023   HTN (hypertension) 01/17/2023   HLD (hyperlipidemia) 01/17/2023   Asthma 01/17/2023   Unstable angina (HCC) 01/17/2023   Polyp of transverse colon    Total knee replacement status 10/18/2020   Primary osteoarthritis of right hip 08/06/2020   Sarcoidosis of lung (HCC) 05/29/2020   Aneurysm of celiac artery (HCC) 02/11/2020   Localized swelling of right lower leg 09/06/2019   OSA on CPAP 09/06/2019   Secondary hyperparathyroidism of renal origin (HCC) 07/21/2019   Anemia in chronic kidney disease 06/14/2019   Benign hypertensive kidney disease with chronic kidney disease 06/14/2019   Sickle cell trait (HCC) 06/14/2019   Stage 3b chronic kidney disease (HCC) 06/14/2019   Chronic bronchitis (HCC) 05/19/2019   Chronic venous insufficiency 04/15/2019   Lymphedema 04/15/2019   Family history of colon cancer    Trigeminal neuralgia 09/11/2018   Low back pain without sciatica 02/21/2017   Renal cyst 10/18/2016   Atherosclerosis of aorta (HCC) 10/16/2016   History of colonic polyps 09/25/2016   Dyslipidemia 12/08/2015   Gastroesophageal reflux disease without esophagitis 08/11/2015   Primary osteoarthritis of knee 06/13/2015   Hip bursitis 06/13/2015   RLS (restless legs syndrome) 06/13/2015   Depression, major, in remission (HCC) 06/13/2015   B12 deficiency 06/13/2015   History of Roux-en-Y gastric bypass 06/13/2015   History of iron deficiency anemia 06/13/2015   Vitamin D deficiency 06/13/2015   Hypertension, benign 06/13/2015   Perennial allergic rhinitis with seasonal variation 06/13/2015   Primary osteoarthritis of both knees  06/13/2015   Cervical radiculitis 01/04/2014   Cervical spinal stenosis 01/04/2014   Cervical osteoarthritis 01/04/2014    Past Surgical History:  Procedure Laterality Date   BARIATRIC SURGERY     CESAREAN SECTION     3 or more   COLONOSCOPY WITH PROPOFOL N/A 03/15/2019   Procedure: COLONOSCOPY WITH PROPOFOL;  Surgeon: Toney Reil, MD;  Location: [facility] ENDOSCOPY;  Service: Gastroenterology;  Laterality: N/A;   COLONOSCOPY WITH PROPOFOL N/A 03/15/2022   Procedure: COLONOSCOPY WITH PROPOFOL;  Surgeon: Toney Reil, MD;  Location: [facility] ENDOSCOPY;  Service: Gastroenterology;  Laterality: N/A;   GASTRIC BYPASS     KNEE ARTHROPLASTY Right 10/18/2020   Procedure: COMPUTER ASSISTED TOTAL KNEE ARTHROPLASTY;  Surgeon: Donato Heinz, MD;  Location: [facility] ORS;  Service: Orthopedics;  Laterality: Right;   LEFT HEART CATH AND CORONARY ANGIOGRAPHY N/A 01/20/2023   Procedure: LEFT HEART CATH AND CORONARY ANGIOGRAPHY;  Surgeon: Iran Ouch, MD;  Location: [facility] INVASIVE CV LAB;  Service: Cardiovascular;  Laterality: N/A;   SHOULDER ARTHROSCOPY Right    TUBAL LIGATION      Family History  Problem Relation Age of Onset   Hypercholesterolemia Mother    Heart disease Mother    Hypertension Mother    Alcohol abuse Father    Lung cancer Brother    Alcohol abuse Brother    Diabetes Mellitus II Sister    Hypertension Maternal Grandmother    Colon cancer Sister    Breast cancer Neg Hx    Prostate cancer Neg Hx    Bladder Cancer Neg Hx  Kidney cancer Neg Hx     Social History   Tobacco Use   Smoking status: Never   Smokeless tobacco: Never   Tobacco comments:    Second hand smoke exposure  Substance Use Topics   Alcohol use: Not Currently    Alcohol/week: 0.0 standard drinks of alcohol     Current Outpatient Medications:    acetaminophen (TYLENOL) 650 MG CR tablet, Take 1,300 mg by mouth every 8 (eight) hours as needed for pain., Disp: , Rfl:    albuterol (VENTOLIN HFA)  108 (90 Base) MCG/ACT inhaler, INHALE 1-2 PUFFS BY MOUTH EVERY 6 HOURS AS NEEDED FOR WHEEZE OR SHORTNESS OF BREATH, Disp: 18 each, Rfl: 0   amLODipine (NORVASC) 10 MG tablet, Take 5 mg by mouth daily., Disp: , Rfl:    Ascorbic Acid (VITAMIN C) 1000 MG tablet, Take 1,000 mg by mouth daily., Disp: , Rfl:    aspirin EC 81 MG tablet, Take 1 tablet by mouth daily., Disp: , Rfl:    atorvastatin (LIPITOR) 40 MG tablet, Take 1 tablet (40 mg total) by mouth daily., Disp: 90 tablet, Rfl: 1   azelastine (ASTELIN) 0.1 % nasal spray, Place 2 sprays into both nostrils 2 (two) times daily. Use in each nostril as directed, Disp: 30 mL, Rfl: 12   budesonide (PULMICORT) 0.5 MG/2ML nebulizer solution, Take 2 mLs (0.5 mg total) by nebulization in the morning and at bedtime., Disp: 120 mL, Rfl: 6   calcium carbonate (TUMS - DOSED IN MG ELEMENTAL CALCIUM) 500 MG chewable tablet, Chew 500-1,000 mg by mouth daily as needed for indigestion or heartburn., Disp: , Rfl:    chlorhexidine (PERIDEX) 0.12 % solution, PLEASE SEE ATTACHED FOR DETAILED DIRECTIONS, Disp: , Rfl:    Cholecalciferol (D 1000) 25 MCG (1000 UT) capsule, Take 1,000 Units by mouth daily., Disp: , Rfl:    ezetimibe (ZETIA) 10 MG tablet, TAKE 1 TABLET BY MOUTH EVERY DAY, Disp: 90 tablet, Rfl: 1   ferrous sulfate 325 (65 FE) MG tablet, Take 325 mg by mouth daily with breakfast., Disp: , Rfl:    fluticasone (FLONASE) 50 MCG/ACT nasal spray, SPRAY 2 SPRAYS INTO EACH NOSTRIL EVERY DAY, Disp: 48 mL, Rfl: 1   Fluticasone-Umeclidin-Vilant (TRELEGY ELLIPTA) 200-62.5-25 MCG/ACT AEPB, INHALE ONE ACTUATION INTO THE LUNGS DAILY. Please schedule office visit before any future refills., Disp: 60 each, Rfl: 0   gabapentin (NEURONTIN) 100 MG capsule, Take 100-200 mg by mouth 2 (two) times daily. One in am and two at night, Disp: , Rfl:    ipratropium-albuterol (DUONEB) 0.5-2.5 (3) MG/3ML SOLN, Take 3 mLs by nebulization every 6 (six) hours as needed., Disp: 360 mL, Rfl: 3    levocetirizine (XYZAL ALLERGY 24HR) 5 MG tablet, Take 1 tablet (5 mg total) by mouth every evening., Disp: 30 tablet, Rfl: 6   losartan (COZAAR) 25 MG tablet, TAKE 1 TABLET (25 MG TOTAL) BY MOUTH DAILY., Disp: 90 tablet, Rfl: 0   magnesium oxide (MAG-OX) 400 MG tablet, Take 400 mg by mouth daily., Disp: , Rfl:    methocarbamol (ROBAXIN) 500 MG tablet, Take 500 mg by mouth every 8 (eight) hours as needed for muscle spasms., Disp: , Rfl:    Multiple Vitamins-Minerals (CENTRUM SILVER) tablet, Take 1 tablet by mouth daily., Disp: 30 tablet, Rfl: 0   nortriptyline (PAMELOR) 10 MG capsule, Take 10-20 mg by mouth at bedtime., Disp: , Rfl:    Peppermint Oil (IBGARD) 90 MG CPCR, Take 2 capsules by mouth 2 (two) times daily., Disp: ,  Rfl:    Probiotic Product (FORTIFY PROBIOTIC WOMENS EX ST PO), Take by mouth daily., Disp: , Rfl:    propranolol (INDERAL) 10 MG tablet, Take 1 tablet (10 mg total) by mouth 3 (three) times daily as needed. For sustained fast heart rate, Disp: 60 tablet, Rfl: 3   zinc gluconate 50 MG tablet, Take 50 mg by mouth daily., Disp: , Rfl:   Allergies  Allergen Reactions   Shellfish Allergy     Edema    I personally reviewed {Reviewed:14835} with the patient/caregiver today.   ROS  ***  Objective  There were no vitals filed for this visit.  There is no height or weight on file to calculate BMI.  Physical Exam ***  Recent Results (from the past 2160 hour(s))  CBC with Differential/Platelet     Status: None   Collection Time: 05/26/23 10:36 AM  Result Value Ref Range   WBC 5.6 3.4 - 10.8 x10E3/uL   RBC 4.15 3.77 - 5.28 x10E6/uL   Hemoglobin 12.4 11.1 - 15.9 g/dL   Hematocrit 44.0 34.7 - 46.6 %   MCV 94 79 - 97 fL   MCH 29.9 26.6 - 33.0 pg   MCHC 31.9 31.5 - 35.7 g/dL   RDW 42.5 95.6 - 38.7 %   Platelets 216 150 - 450 x10E3/uL   Neutrophils 49 Not Estab. %   Lymphs 40 Not Estab. %   Monocytes 8 Not Estab. %   Eos 2 Not Estab. %   Basos 1 Not Estab. %    Neutrophils Absolute 2.7 1.4 - 7.0 x10E3/uL   Lymphocytes Absolute 2.2 0.7 - 3.1 x10E3/uL   Monocytes Absolute 0.5 0.1 - 0.9 x10E3/uL   EOS (ABSOLUTE) 0.1 0.0 - 0.4 x10E3/uL   Basophils Absolute 0.1 0.0 - 0.2 x10E3/uL   Immature Granulocytes 0 Not Estab. %   Immature Grans (Abs) 0.0 0.0 - 0.1 x10E3/uL  B12 and Folate Panel     Status: Abnormal   Collection Time: 05/26/23 10:36 AM  Result Value Ref Range   Vitamin B-12 >2000 (H) 232 - 1245 pg/mL   Folate >20.0 >3.0 ng/mL    Comment: A serum folate concentration of less than 3.1 ng/mL is considered to represent clinical deficiency.   Iron, TIBC and Ferritin Panel     Status: None   Collection Time: 05/26/23 10:36 AM  Result Value Ref Range   Total Iron Binding Capacity 274 250 - 450 ug/dL   UIBC 564 332 - 951 ug/dL   Iron 94 27 - 884 ug/dL   Iron Saturation 34 15 - 55 %   Ferritin 114 15 - 150 ng/mL    Diabetic Foot Exam: Diabetic Foot Exam - Simple   No data filed    ***  PHQ2/9:    04/28/2023    3:16 PM 03/25/2023    9:49 AM 03/14/2023    1:38 PM 11/19/2022   11:01 AM 10/31/2022   11:44 AM  Depression screen PHQ 2/9  Decreased Interest 0 0 0 0 0  Down, Depressed, Hopeless 0 0 0 0 0  PHQ - 2 Score 0 0 0 0 0  Altered sleeping 0 0 0 0 0  Tired, decreased energy 1 0 0 1 1  Change in appetite 0 0 0 0 0  Feeling bad or failure about yourself  0 0 0 0 0  Trouble concentrating 1 0 0 0 0  Moving slowly or fidgety/restless 0 0 0 0 0  Suicidal thoughts 0 0  0 0 0  PHQ-9 Score 2 0 0 1 1  Difficult doing work/chores Not difficult at all        phq 9 is {gen pos [id number]} ***  Fall Risk:    04/14/2023   10:11 AM 03/25/2023    9:48 AM 03/14/2023    1:38 PM 11/19/2022   11:00 AM 10/31/2022   11:38 AM  Fall Risk   Falls in the past year? 0 0 0 0 0  Number falls in past yr: 0  0 0 0  Injury with Fall? 0  0 0 0  Risk for fall due to : No Fall Risks No Fall Risks No Fall Risks No Fall Risks No Fall Risks  Follow up Falls  evaluation completed;Education provided;Falls prevention discussed Falls prevention discussed Falls prevention discussed Falls prevention discussed Education provided;Falls prevention discussed   ***   Functional Status Survey:   ***   Assessment & Plan  *** There are no diagnoses linked to this encounter.

## 2023-07-18 ENCOUNTER — Ambulatory Visit: Payer: Medicare Other

## 2023-07-21 ENCOUNTER — Ambulatory Visit: Payer: Medicare Other

## 2023-07-22 ENCOUNTER — Ambulatory Visit: Payer: Medicare Other | Admitting: Family Medicine

## 2023-07-22 ENCOUNTER — Encounter: Payer: Medicare Other | Attending: Internal Medicine | Admitting: *Deleted

## 2023-07-22 DIAGNOSIS — D860 Sarcoidosis of lung: Secondary | ICD-10-CM | POA: Insufficient documentation

## 2023-07-22 NOTE — Progress Notes (Signed)
Daily Session Note  Patient Details  Name: Melissa [name] MRN: [id number] Date of Birth: 10-25-1950 Referring Provider:   Flowsheet Row Pulmonary Rehab from 04/28/2023 in [facility] Cardiac and Pulmonary Rehab  Referring Provider Dr. Belia Heman       Encounter Date: 07/22/2023  Check In:  Session Check In - 07/22/23 0929       Check-In   Supervising physician immediately available to respond to emergencies See telemetry face sheet for immediately available ER MD    Location [facility]-Cardiac & Pulmonary Rehab    Staff Present Maxon Conetta BS, , Exercise Physiologist;Margaret Best, MS, Exercise Physiologist;Noah Tickle, BS, Exercise Physiologist;Briunna Leicht, RN, BSN, CCRP    Virtual Visit No    Medication changes reported     No    Fall or balance concerns reported    No    Warm-up and Cool-down Performed on first and last piece of equipment    Resistance Training Performed Yes    VAD Patient? No    PAD/SET Patient? No      Pain Assessment   Currently in Pain? No/denies                Social History   Tobacco Use  Smoking Status Never  Smokeless Tobacco Never  Tobacco Comments   Second hand smoke exposure    Goals Met:  Proper associated with RPD/PD & O2 Sat Independence with exercise equipment Exercise tolerated well No report of concerns or symptoms today  Goals Unmet:  Not Applicable  Comments: Pt able to follow exercise prescription today without complaint.  Will continue to monitor for progression.    Dr. Bethann Punches is Medical Director for [facility] Cardiac Rehabilitation.  Dr. Vida Rigger is Medical Director for [facility] Pulmonary Rehabilitation.

## 2023-07-23 ENCOUNTER — Ambulatory Visit: Payer: Medicare Other

## 2023-07-24 ENCOUNTER — Encounter: Payer: Medicare Other | Admitting: *Deleted

## 2023-07-24 DIAGNOSIS — D860 Sarcoidosis of lung: Secondary | ICD-10-CM

## 2023-07-24 NOTE — Progress Notes (Signed)
Daily Session Note  Patient Details  Name: Melissa [name] MRN: [id number] Date of Birth: [birth year] Referring Provider:   Flowsheet Row Pulmonary Rehab from 04/28/2023 in [facility] Cardiac and Pulmonary Rehab  Referring Provider Dr. Belia Heman       Encounter Date: 07/24/2023  Check In:  Session Check In - 07/24/23 1115       Check-In   Supervising physician immediately available to respond to emergencies See telemetry face sheet for immediately available ER MD    Location [facility]-Cardiac & Pulmonary Rehab    Staff Present Susann Givens, RN BSN;Joseph [place], RCP,RRT,BSRT;Noah Tickle, BS, Exercise Physiologist;Telina Kleckley, RN, BSN, CCRP    Virtual Visit No    Medication changes reported     No    Fall or balance concerns reported    No    Warm-up and Cool-down Performed on first and last piece of equipment    Resistance Training Performed Yes    VAD Patient? No    PAD/SET Patient? No      Pain Assessment   Currently in Pain? No/denies                Social History   Tobacco Use  Smoking Status Never  Smokeless Tobacco Never  Tobacco Comments   Second hand smoke exposure    Goals Met:  Proper associated with RPD/PD & O2 Sat Independence with exercise equipment Exercise tolerated well No report of concerns or symptoms today  Goals Unmet:  Not Applicable  Comments: Pt able to follow exercise prescription today without complaint.  Will continue to monitor for progression.    Dr. Bethann Punches is Medical Director for [facility] Cardiac Rehabilitation.  Dr. Vida Rigger is Medical Director for [facility] Pulmonary Rehabilitation.

## 2023-07-25 ENCOUNTER — Ambulatory Visit: Payer: Medicare Other

## 2023-07-28 ENCOUNTER — Ambulatory Visit: Payer: Medicare Other

## 2023-07-29 ENCOUNTER — Encounter: Payer: Medicare Other | Admitting: *Deleted

## 2023-07-29 DIAGNOSIS — D860 Sarcoidosis of lung: Secondary | ICD-10-CM | POA: Diagnosis not present

## 2023-07-29 NOTE — Progress Notes (Signed)
Daily Session Note  Patient Details  Name: Melissa [name] MRN: [id number] Date of Birth: 04-14-1951 Referring Provider:   Flowsheet Row Pulmonary Rehab from 04/28/2023 in [facility] Cardiac and Pulmonary Rehab  Referring Provider Dr. Belia Heman       Encounter Date: 07/29/2023  Check In:  Session Check In - 07/29/23 1108       Check-In   Supervising physician immediately available to respond to emergencies See telemetry face sheet for immediately available ER MD    Location [facility]-Cardiac & Pulmonary Rehab    Staff Present Maxon Suzzette Righter, , Exercise Physiologist;Meredith Jewel Baize, RN BSN;Jason Wallace Cullens, RDN, LDN;Jonesha Tsuchiya, RN, BSN, CCRP    Virtual Visit No    Medication changes reported     No    Fall or balance concerns reported    No    Warm-up and Cool-down Performed on first and last piece of equipment    Resistance Training Performed Yes    VAD Patient? No    PAD/SET Patient? No      Pain Assessment   Currently in Pain? No/denies                Social History   Tobacco Use  Smoking Status Never  Smokeless Tobacco Never  Tobacco Comments   Second hand smoke exposure    Goals Met:  Proper associated with RPD/PD & O2 Sat Independence with exercise equipment Exercise tolerated well No report of concerns or symptoms today  Goals Unmet:  Not Applicable  Comments: Pt able to follow exercise prescription today without complaint.  Will continue to monitor for progression.    Dr. Bethann Punches is Medical Director for [facility] Cardiac Rehabilitation.  Dr. Vida Rigger is Medical Director for [facility] Pulmonary Rehabilitation.

## 2023-07-30 DIAGNOSIS — F411 Generalized anxiety disorder: Secondary | ICD-10-CM | POA: Diagnosis not present

## 2023-07-31 ENCOUNTER — Encounter: Payer: Medicare Other | Admitting: *Deleted

## 2023-07-31 ENCOUNTER — Ambulatory Visit (INDEPENDENT_AMBULATORY_CARE_PROVIDER_SITE_OTHER): Payer: Medicare Other | Admitting: Internal Medicine

## 2023-07-31 ENCOUNTER — Encounter: Payer: Self-pay | Admitting: Internal Medicine

## 2023-07-31 VITALS — BP 122/88 | HR 101 | Temp 97.6°F | Ht 72.0 in | Wt 204.6 lb

## 2023-07-31 DIAGNOSIS — D869 Sarcoidosis, unspecified: Secondary | ICD-10-CM

## 2023-07-31 DIAGNOSIS — Z8616 Personal history of COVID-19: Secondary | ICD-10-CM | POA: Diagnosis not present

## 2023-07-31 DIAGNOSIS — G4733 Obstructive sleep apnea (adult) (pediatric): Secondary | ICD-10-CM

## 2023-07-31 DIAGNOSIS — D860 Sarcoidosis of lung: Secondary | ICD-10-CM | POA: Diagnosis not present

## 2023-07-31 NOTE — Patient Instructions (Signed)
Excellent Job A+ Continue CPAP as prescribed  Continue inhalers as needed Can use albuterol inhaler prior to exertion  Follow-up cardiology as scheduled Follow-up eye examination as scheduled Follow-up kidney doctor as scheduled  Avoid secondhand smoke Avoid SICK contacts Recommend  Masking  when appropriate Recommend Keep up-to-date with vaccinations

## 2023-07-31 NOTE — Progress Notes (Signed)
Daily Session Note  Patient Details  Name: KARENZA ELBON MRN: [id number] Date of Birth: 04-01-1951 Referring Provider:   Flowsheet Row Pulmonary Rehab from 04/28/2023 in [facility] Cardiac and Pulmonary Rehab  Referring Provider Dr. Belia Heman       Encounter Date: 07/31/2023  Check In:  Session Check In - 07/31/23 1104       Check-In   Supervising physician immediately available to respond to emergencies See telemetry face sheet for immediately available ER MD    Location [facility]-Cardiac & Pulmonary Rehab    Staff Present Ronette Deter, BS, Exercise Physiologist;Meredith Jewel Baize, RN BSN;Maxon Conetta BS, , Exercise Physiologist;Tishawn Friedhoff Katrinka Blazing, RN, ADN    Virtual Visit No    Medication changes reported     No    Fall or balance concerns reported    No    Warm-up and Cool-down Performed on first and last piece of equipment    Resistance Training Performed Yes    VAD Patient? No    PAD/SET Patient? No      Pain Assessment   Currently in Pain? No/denies                Social History   Tobacco Use  Smoking Status Never  Smokeless Tobacco Never  Tobacco Comments   Second hand smoke exposure    Goals Met:  Independence with exercise equipment Exercise tolerated well No report of concerns or symptoms today Strength training completed today  Goals Unmet:  Not Applicable  Comments: Pt able to follow exercise prescription today without complaint.  Will continue to monitor for progression.    Dr. Bethann Punches is Medical Director for [facility] Cardiac Rehabilitation.  Dr. Vida Rigger is Medical Director for [facility] Pulmonary Rehabilitation.

## 2023-07-31 NOTE — Progress Notes (Unsigned)
**CT chest 05/01/2019>> bilateral scattered infiltrates, predominantly upper lobe. **CPAP download 07/22/2018-08/20/2018>> usage greater than 4 hours 26/30 days.  Average usage on days used is 7 hours 9 minutes, pressure range 5-15.  Median pressure 8, 95th percentile pressure 11, maximum pressure 13.  Leaks are within normal limits.  Residual AHI is 4.4 overall this shows good compliance with CPAP with excellent control of obstructive sleep apnea. **HST 06/03/2018>> mild sleep apnea with AHI of 10. **chest x-ray7/6/18>>  hyperinflation consistent with emphysema. **CBC 02/21/17>> eosinophils equals 300. CPAP download August 2023 100% for days 90% for greater than 4 hours AHI reduced to 1.2  Chief complaint Follow-up assessment for reactive airways disease Follow-up assessment for OSA Follow-up sarcoid Follow-up assessment for abnormal CT chest  HPI Biopsy-proven sarcoid in the year 2000 Abnormal CT chest since 2020 Most recent CAT scan August 2023 shows similar findings over the last several years Bilateral interstitial lung disease nodular opacities with cavitary lesions with mediastinal adenopathy with calcifications No significant changes over the last several years Findings relayed to patient in detail  Previous diagnosis of COVID-19 infection several times Patient was placed on Trelegy inhaler for reactive airways disease At this time there is no respiratory issues at this time We will plan to use Trelegy as needed Plan to use albuterol as needed  Regarding OSA Patient with excellent compliance report with auto CPAP 5-15 Well-controlled sleep apnea AHI reduced to 1.1 Patient use and benefits from therapy   Pets: Dog Occupation: Retired Airline pilot Exposures: No known exposures.  No mold, hot tub, Jacuzzi.  No feather pillows or comforters Smoking history: Never smoker Travel history: From [place].  New to [place] in 2005 Relevant family history: Brother  had lung cancer.  He was a smoker.      HRCT Chest 10/03/2021>> Ordered at [facility] by Pulmonology Specialist FINDINGS:  AIRWAYS, LUNGS, PLEURA: Clear central tracheobronchial tree.  No pleural effusion. Multifocal scattered peribronchial nodular consolidation bilaterally. Left apical alveolar airspace consolidation with architectural distortion. Minimal peripheral predominant reticulation and traction bronchiectasis in middle lobe, lingula and bilateral lower lobes.Mild air trapping at the lung bases. No honeycombing   MEDIASTINUM: Normal heart size.  No pericardial effusion.  Mildly dilated 3.2 cm main pulmonary artery. Numerous enlarged and calcified mediastinal and bilateral hilar lymph nodes.   IMAGED ABDOMEN: Partially imaged lobulated multicystic kidneys containing nonobstructing nephrolithiasis. Sequela of Roux-en-Y gastric bypass. Colonic diverticulosis.      CT chest August 2023 similar findings from February 2023  CXR 09/10/2021 Bilateral ill-defined nodular densities predominantly in the right upper lobe, likely chronic inflammatory process. No new consolidation. There is no pleural effusion pneumothorax. The cardiac silhouette is within normal limits. No acute osseous pathology. Degenerative changes of the spine.        Latest Ref Rng & Units 05/26/2023   10:36 AM 01/18/2023    5:31 AM 01/17/2023   11:32 AM  CBC  WBC 3.4 - 10.8 x10E3/uL 5.6  3.9  7.3   Hemoglobin 11.1 - 15.9 g/dL 56.2  13.0  86.5   Hematocrit 34.0 - 46.6 % 38.9  35.4  40.7   Platelets 150 - 450 x10E3/uL 216  185  218        Latest Ref Rng & Units 01/18/2023    5:31 AM 01/17/2023   11:32 AM 10/10/2022    9:40 AM  BMP  Glucose 70 - 99 mg/dL 90  784    BUN 8 - 23 mg/dL 17  21    Creatinine 0.44 - 1.00 mg/dL 8.46  9.62  9.52   Sodium 135 - 145 mmol/L 138  138    Potassium 3.5 - 5.1 mmol/L 4.4  4.3    Chloride 98 - 111 mmol/L 106  103    CO2 22 - 32 mmol/L 25  21    Calcium 8.9 - 10.3 mg/dL 9.1  9.2       BNP No results found for: "BNP"  ProBNP No results found for: "PROBNP"  PFT    Component Value Date/Time   FEV1PRE 3.54 04/07/2020 1148   FEV1POST 3.55 04/07/2020 1148   FVCPRE 4.71 04/07/2020 1148   FVCPOST 4.61 04/07/2020 1148   TLC 7.48 04/07/2020 1148   DLCOUNC 20.30 04/07/2020 1148   PREFEV1FVCRT 75 04/07/2020 1148   PSTFEV1FVCRT 77 04/07/2020 1148    No results found.    BP 122/88 (BP Location: Left Arm, Patient Position: Sitting, Cuff Size: Small)   Pulse (!) 101   Temp 97.6 F (36.4 C) (Temporal)   Ht 6' (1.829 m)   Wt 204 lb 9.6 oz (92.8 kg)   SpO2 99%   BMI 27.75 kg/m        Review of Systems: Gen:  Denies  fever, sweats, chills weight loss  HEENT: Denies blurred vision, double vision, ear pain, eye pain, hearing loss, nose bleeds, sore throat Cardiac:  No dizziness, chest pain or heaviness, chest tightness,edema, No JVD Resp:   No cough, -sputum production, -shortness of breath,-wheezing, -hemoptysis,  Other:  All other systems negative   Physical Examination:   General Appearance: No distress  EYES PERRLA, EOM intact.   NECK Supple, No JVD Pulmonary: normal breath sounds, No wheezing.  CardiovascularNormal S1,S2.  No m/r/g.   Abdomen: Benign, Soft, non-tender. Neurology UE/LE 5/5 strength, no focal deficits Ext pulses intact, cap refill intact ALL OTHER ROS ARE NEGATIVE     Assessment/Plan 73 year old pleasant African-American female with a diagnosis of pulmonary sarcoid back in 2000 biopsy-proven with a history and diagnosis of obstructive sleep apnea with previous history of COVID-19 infections with a history of intermittent reactive airways disease and asthma  Pulmonary sarcoid No indication for repeat CT chest at this time Previous CT scans of her chest reviewed in detail No indication for medication at this time  Reactive airways disease and asthma At this time patient has no symptoms Infrequent use of albuterol Plan to  wean off Trelegy and use it as needed   Assessment of OSA Patient continues to benefit from CPAP therapy Auto CPAP 5-15 AHI reduced to 1 Excellent compliance report OSA well-controlled with current prescription    Follow-up with cardiology as scheduled  Follow-up eye examination annually  Follow-up nephrology as scheduled   MEDICATION ADJUSTMENTS/LABS AND TESTS ORDERED: Continue CPAP as prescribed  Continue inhalers as needed Can use albuterol inhaler prior to exertion  Follow-up cardiology as scheduled Follow-up eye examination as scheduled Follow-up kidney doctor as scheduled  Avoid secondhand smoke Avoid SICK contacts Recommend  Masking  when appropriate Recommend Keep up-to-date with vaccinations   Patient  satisfied with Plan of action and management. All questions answered  Follow-up in 6 months  Total time spent 34 minutes which includes face to face time along with review of records and CT scans   Lucie Leather, M.D.  [facility]  Medical Director [facility] Medical Director [facility] Cardio-Pulmonary Department

## 2023-08-06 ENCOUNTER — Encounter: Payer: Self-pay | Admitting: *Deleted

## 2023-08-06 DIAGNOSIS — M2012 Hallux valgus (acquired), left foot: Secondary | ICD-10-CM | POA: Diagnosis not present

## 2023-08-06 DIAGNOSIS — M2042 Other hammer toe(s) (acquired), left foot: Secondary | ICD-10-CM | POA: Diagnosis not present

## 2023-08-06 DIAGNOSIS — M2041 Other hammer toe(s) (acquired), right foot: Secondary | ICD-10-CM | POA: Diagnosis not present

## 2023-08-06 DIAGNOSIS — L84 Corns and callosities: Secondary | ICD-10-CM | POA: Diagnosis not present

## 2023-08-06 DIAGNOSIS — M79672 Pain in left foot: Secondary | ICD-10-CM | POA: Diagnosis not present

## 2023-08-06 DIAGNOSIS — L851 Acquired keratosis [keratoderma] palmaris et plantaris: Secondary | ICD-10-CM | POA: Diagnosis not present

## 2023-08-06 DIAGNOSIS — M2011 Hallux valgus (acquired), right foot: Secondary | ICD-10-CM | POA: Diagnosis not present

## 2023-08-06 DIAGNOSIS — Q6671 Congenital pes cavus, right foot: Secondary | ICD-10-CM | POA: Diagnosis not present

## 2023-08-06 DIAGNOSIS — D860 Sarcoidosis of lung: Secondary | ICD-10-CM

## 2023-08-06 DIAGNOSIS — Q6672 Congenital pes cavus, left foot: Secondary | ICD-10-CM | POA: Diagnosis not present

## 2023-08-06 NOTE — Progress Notes (Signed)
Pulmonary Individual Treatment Plan  Patient Details  Name: HERO PREM MRN: [id number] Date of Birth: 1951-07-06 Referring Provider:   Flowsheet Row Pulmonary Rehab from 04/28/2023 in [facility] Cardiac and Pulmonary Rehab  Referring Provider Dr. Belia Heman       Initial Encounter Date:  Flowsheet Row Pulmonary Rehab from 04/28/2023 in [facility] Cardiac and Pulmonary Rehab  Date 04/28/23       Visit Diagnosis: Sarcoidosis of lung (HCC)  Patient's Home Medications on Admission:  Current Outpatient Medications:    acetaminophen (TYLENOL) 650 MG CR tablet, Take 1,300 mg by mouth every 8 (eight) hours as needed for pain., Disp: , Rfl:    albuterol (VENTOLIN HFA) 108 (90 Base) MCG/ACT inhaler, INHALE 1-2 PUFFS BY MOUTH EVERY 6 HOURS AS NEEDED FOR WHEEZE OR SHORTNESS OF BREATH, Disp: 18 each, Rfl: 0   amLODipine (NORVASC) 10 MG tablet, Take 5 mg by mouth daily., Disp: , Rfl:    Ascorbic Acid (VITAMIN C) 1000 MG tablet, Take 1,000 mg by mouth daily., Disp: , Rfl:    aspirin EC 81 MG tablet, Take 1 tablet by mouth daily., Disp: , Rfl:    atorvastatin (LIPITOR) 40 MG tablet, Take 1 tablet (40 mg total) by mouth daily., Disp: 90 tablet, Rfl: 1   azelastine (ASTELIN) 0.1 % nasal spray, Place 2 sprays into both nostrils 2 (two) times daily. Use in each nostril as directed, Disp: 30 mL, Rfl: 12   budesonide (PULMICORT) 0.5 MG/2ML nebulizer solution, Take 2 mLs (0.5 mg total) by nebulization in the morning and at bedtime., Disp: 120 mL, Rfl: 6   calcium carbonate (TUMS - DOSED IN MG ELEMENTAL CALCIUM) 500 MG chewable tablet, Chew 500-1,000 mg by mouth daily as needed for indigestion or heartburn., Disp: , Rfl:    Cholecalciferol (D 1000) 25 MCG (1000 UT) capsule, Take 1,000 Units by mouth daily., Disp: , Rfl:    ezetimibe (ZETIA) 10 MG tablet, TAKE 1 TABLET BY MOUTH EVERY DAY, Disp: 90 tablet, Rfl: 1   fluticasone (FLONASE) 50 MCG/ACT nasal spray, SPRAY 2 SPRAYS INTO EACH NOSTRIL EVERY DAY, Disp: 48 mL, Rfl:  1   Fluticasone-Umeclidin-Vilant (TRELEGY ELLIPTA) 200-62.5-25 MCG/ACT AEPB, INHALE ONE ACTUATION INTO THE LUNGS DAILY. Please schedule office visit before any future refills., Disp: 60 each, Rfl: 0   gabapentin (NEURONTIN) 100 MG capsule, Take 100-200 mg by mouth 2 (two) times daily. One in am and two at night, Disp: , Rfl:    ipratropium-albuterol (DUONEB) 0.5-2.5 (3) MG/3ML SOLN, Take 3 mLs by nebulization every 6 (six) hours as needed., Disp: 360 mL, Rfl: 3   JARDIANCE 10 MG TABS tablet, Take 10 mg by mouth daily., Disp: , Rfl:    levocetirizine (XYZAL ALLERGY 24HR) 5 MG tablet, Take 1 tablet (5 mg total) by mouth every evening., Disp: 30 tablet, Rfl: 6   magnesium oxide (MAG-OX) 400 MG tablet, Take 400 mg by mouth daily., Disp: , Rfl:    Multiple Vitamins-Minerals (CENTRUM SILVER) tablet, Take 1 tablet by mouth daily., Disp: 30 tablet, Rfl: 0   nortriptyline (PAMELOR) 10 MG capsule, Take 10-20 mg by mouth at bedtime., Disp: , Rfl:    Probiotic Product (FORTIFY PROBIOTIC WOMENS EX ST PO), Take by mouth daily., Disp: , Rfl:    propranolol (INDERAL) 10 MG tablet, Take 1 tablet (10 mg total) by mouth 3 (three) times daily as needed. For sustained fast heart rate, Disp: 60 tablet, Rfl: 3   zinc gluconate 50 MG tablet, Take 50 mg by mouth  daily., Disp: , Rfl:   Past Medical History: Past Medical History:  Diagnosis Date   Aortic atherosclerosis (HCC)    Asthma    Bruising    Cardiomyopathy (HCC)    Celiac artery aneurysm (HCC)    Cervical radiculopathy    CKD (chronic kidney disease), stage III (HCC)    Deficiency of vitamin B    Depression    Edema    Flank pain    Gross hematuria    History of 2019 novel coronavirus disease (COVID-19) 05/01/2019   History of bariatric surgery    HLD (hyperlipidemia)    HTN (hypertension)    Interstitial lung disease (HCC)    Iron deficiency anemia    Leukopenia    OA (osteoarthritis)    Obesity    OSA on CPAP    Osteopenia    Perennial  allergic rhinitis    Pneumonia due to COVID-19 virus 2020   Renal cysts, acquired, bilateral    RLS (restless legs syndrome)    Sarcoidosis    Sickle cell trait (HCC)    Snoring    Spinal stenosis    Trigeminal neuralgia     Tobacco Use: Social History   Tobacco Use  Smoking Status Never  Smokeless Tobacco Never  Tobacco Comments   Second hand smoke exposure    Labs: Review Flowsheet  More data exists      Latest Ref Rng & Units 10/18/2020 04/10/2021 04/10/2022 01/17/2023 01/18/2023  Labs for ITP Cardiac and Pulmonary Rehab  Cholestrol 0 - 200 mg/dL - 409  811  - 914   LDL (calc) 0 - 99 mg/dL - 94  57  - 54   HDL-C >40 mg/dL - 92  82  - 72   Trlycerides <150 mg/dL - 72  50  - 42   Hemoglobin A1c 4.8 - 5.6 % - - - 5.9  -  TCO2 22 - 32 mmol/L 25  - - - -     Pulmonary Assessment Scores:  Pulmonary Assessment Scores     Row Name 04/28/23 1515         ADL UCSD   ADL Phase Entry     SOB Score total 14     Rest 0     Walk 0     Stairs 2     Bath 0     Dress 1     Shop 2       CAT Score   CAT Score 6       mMRC Score   mMRC Score 1              UCSD: Self-administered rating of dyspnea associated with activities of daily living (ADLs) 6-point scale (0 = "not at all" to 5 = "maximal or unable to do because of breathlessness")  Scoring Scores range from 0 to 120.  Minimally important difference is 5 units  CAT: CAT can identify the health impairment of COPD patients and is better correlated with disease progression.  CAT has a scoring range of zero to 40. The CAT score is classified into four groups of low (less than 10), medium (10 - 20), high (21-30) and very high (31-40) based on the impact level of disease on health status. A CAT score over 10 suggests significant symptoms.  A worsening CAT score could be explained by an exacerbation, poor medication adherence, poor inhaler technique, or progression of COPD or comorbid conditions.  CAT MCID is 2  points  mMRC: mMRC (Modified Medical Research Council) Dyspnea Scale is used to assess the degree of baseline functional disability in patients of respiratory disease due to dyspnea. No minimal important difference is established. A decrease in score of 1 point or greater is considered a positive change.   Pulmonary Function Assessment:  Pulmonary Function Assessment - 04/14/23 1018       Breath   Shortness of Breath No             Exercise Target Goals: Exercise Program Goal: Individual exercise prescription set using results from initial 6 min walk test and THRR while considering  patient's activity barriers and safety.   Exercise Prescription Goal: Initial exercise prescription builds to 30-45 minutes a day of aerobic activity, 2-3 days per week.  Home exercise guidelines will be given to patient during program as part of exercise prescription that the participant will acknowledge.  Education: Aerobic Exercise: - Group verbal and visual presentation on the components of exercise prescription. Introduces F.I.T.T principle from ACSM for exercise prescriptions.  Reviews F.I.T.T. principles of aerobic exercise including progression. Written material given at graduation. Flowsheet Row Pulmonary Rehab from 07/03/2023 in [facility] Cardiac and Pulmonary Rehab  Education need identified 04/28/23  Date 06/19/23  Educator MB  Instruction Review Code 1- [employer] Understanding       Education: Resistance Exercise: - Group verbal and visual presentation on the components of exercise prescription. Introduces F.I.T.T principle from ACSM for exercise prescriptions  Reviews F.I.T.T. principles of resistance exercise including progression. Written material given at graduation. Flowsheet Row Pulmonary Rehab from 07/03/2023 in [facility] Cardiac and Pulmonary Rehab  Date 06/12/23  Educator MB  Instruction Review Code 1- [employer] Understanding        Education: Exercise & Equipment Safety: -  Individual verbal instruction and demonstration of equipment use and safety with use of the equipment. Flowsheet Row Pulmonary Rehab from 07/03/2023 in [facility] Cardiac and Pulmonary Rehab  Date 04/28/23  Educator [facility]  Instruction Review Code 1- Verbalizes Understanding       Education: Exercise Physiology & General Exercise Guidelines: - Group verbal and written instruction with models to review the exercise physiology of the cardiovascular system and associated critical values. Provides general exercise guidelines with specific guidelines to those with heart or lung disease.  Flowsheet Row Pulmonary Rehab from 07/03/2023 in [facility] Cardiac and Pulmonary Rehab  Education need identified 04/28/23  Date 06/05/23  Educator MB  Instruction Review Code 1- [employer] Understanding       Education: Flexibility, Balance, Mind/Body Relaxation: - Group verbal and visual presentation with interactive activity on the components of exercise prescription. Introduces F.I.T.T principle from ACSM for exercise prescriptions. Reviews F.I.T.T. principles of flexibility and balance exercise training including progression. Also discusses the mind body connection.  Reviews various relaxation techniques to help reduce and manage stress (i.e. Deep breathing, progressive muscle relaxation, and visualization). Balance handout provided to take home. Written material given at graduation. Flowsheet Row Pulmonary Rehab from 07/03/2023 in [facility] Cardiac and Pulmonary Rehab  Date 06/12/23  Educator MB  Instruction Review Code 1- Verbalizes Understanding       Activity Barriers & Risk Stratification:  Activity Barriers & Cardiac Risk Stratification - 04/28/23 1502       Activity Barriers & Cardiac Risk Stratification   Activity Barriers Right Knee Replacement;Back Problems;Other (comment)    Comments left knee stiffness and some hip pain             6 Minute Walk:  6 Minute Walk  Row Name 04/28/23 1459          6 Minute Walk   Phase Initial     Distance 1290 feet     Walk Time 6 minutes     # of Rest Breaks 0     MPH 2.44     METS 3.45     RPE 13     Perceived Dyspnea  0     VO2 Peak 12.06     Symptoms No     Resting HR 93 bpm     Resting BP 122/72     Resting Oxygen Saturation  95 %     Exercise Oxygen Saturation  during 6 min walk 93 %     Max Ex. HR 130 bpm     Max Ex. BP 158/80     2 Minute Post BP 118/62       Interval HR   1 Minute HR 114     2 Minute HR 117     3 Minute HR 118     4 Minute HR 120     5 Minute HR 130     6 Minute HR 127     2 Minute Post HR 101     Interval Heart Rate? Yes       Interval Oxygen   Interval Oxygen? Yes     Baseline Oxygen Saturation % 95 %     1 Minute Oxygen Saturation % 98 %     1 Minute Liters of Oxygen 0 L     2 Minute Oxygen Saturation % 93 %     2 Minute Liters of Oxygen 0 L     3 Minute Oxygen Saturation % 94 %     3 Minute Liters of Oxygen 0 L     4 Minute Oxygen Saturation % 95 %     4 Minute Liters of Oxygen 0 L     5 Minute Oxygen Saturation % 93 %     5 Minute Liters of Oxygen 0 L     6 Minute Oxygen Saturation % 95 %     6 Minute Liters of Oxygen 0 L     2 Minute Post Oxygen Saturation % 94 %     2 Minute Post Liters of Oxygen 0 L             Oxygen Initial Assessment:  Oxygen Initial Assessment - 04/28/23 1514       Home Oxygen   Home Oxygen Device None    Sleep Oxygen Prescription CPAP    Liters per minute 0    Home Exercise Oxygen Prescription None    Home Resting Oxygen Prescription None    Compliance with Home Oxygen Use Yes      Initial 6 min Walk   Oxygen Used None      Program Oxygen Prescription   Program Oxygen Prescription None      Intervention   Short Term Goals To learn and exhibit compliance with exercise, home and travel O2 prescription;To learn and understand importance of monitoring SPO2 with pulse oximeter and demonstrate accurate use of the pulse oximeter.;To learn and  understand importance of maintaining oxygen saturations>88%;To learn and demonstrate proper pursed lip breathing techniques or other breathing techniques. ;To learn and demonstrate proper use of respiratory medications    Long  Term Goals Exhibits compliance with exercise, home  and travel O2 prescription;Maintenance of O2 saturations>88%;Compliance with respiratory medication;Demonstrates proper use of MDI's;Exhibits proper  breathing techniques, such as pursed lip breathing or other method taught during program session;Verbalizes importance of monitoring SPO2 with pulse oximeter and return demonstration             Oxygen Re-Evaluation:  Oxygen Re-Evaluation     Row Name 06/05/23 1122 06/24/23 1115           Program Oxygen Prescription   Program Oxygen Prescription None None        Home Oxygen   Home Oxygen Device None None      Sleep Oxygen Prescription CPAP CPAP      Liters per minute 0 --      Home Exercise Oxygen Prescription -- None      Home Resting Oxygen Prescription None None      Compliance with Home Oxygen Use Yes Yes        Goals/Expected Outcomes   Short Term Goals To learn and demonstrate proper pursed lip breathing techniques or other breathing techniques.  To learn and demonstrate proper pursed lip breathing techniques or other breathing techniques. ;To learn and demonstrate proper use of respiratory medications;To learn and understand importance of maintaining oxygen saturations>88%      Long  Term Goals Exhibits proper breathing techniques, such as pursed lip breathing or other method taught during program session Exhibits proper breathing techniques, such as pursed lip breathing or other method taught during program session;Compliance with respiratory medication;Maintenance of O2 saturations>88%      Comments Informed patient how to perform the Pursed Lipped breathing technique. Told patient to Inhale through the nose and out the mouth with pursed lips to keep their  airways open, help oxygenate them better, practice when at rest or doing strenuous activity. Patient Verbalizes understanding of technique and will work on and be reiterated during [facility]. Dennie Bible reports doing well in the program. She has noticed an improvement in her shortness of breath and wants to continue to work on her breathing techniques while enrolled in the program. She is compliant with her CPAP use. She notes that some smells will exacerbate her asthma, like when she was cleaning her oven, so she is reminded to take care with those smells.      Goals/Expected Outcomes Short: use PLB with exertion. Long: use PLB on exertion proficiently and independently. Short: practice PLB and awareness on chemical smells. Long: independently manage breathing techniques               Oxygen Discharge (Final Oxygen Re-Evaluation):  Oxygen Re-Evaluation - 06/24/23 1115       Program Oxygen Prescription   Program Oxygen Prescription None      Home Oxygen   Home Oxygen Device None    Sleep Oxygen Prescription CPAP    Home Exercise Oxygen Prescription None    Home Resting Oxygen Prescription None    Compliance with Home Oxygen Use Yes      Goals/Expected Outcomes   Short Term Goals To learn and demonstrate proper pursed lip breathing techniques or other breathing techniques. ;To learn and demonstrate proper use of respiratory medications;To learn and understand importance of maintaining oxygen saturations>88%    Long  Term Goals Exhibits proper breathing techniques, such as pursed lip breathing or other method taught during program session;Compliance with respiratory medication;Maintenance of O2 saturations>88%    Comments Dennie Bible reports doing well in the program. She has noticed an improvement in her shortness of breath and wants to continue to work on her breathing techniques while enrolled in the program. She is  compliant with her CPAP use. She notes that some smells will exacerbate her asthma, like  when she was cleaning her oven, so she is reminded to take care with those smells.    Goals/Expected Outcomes Short: practice PLB and awareness on chemical smells. Long: independently manage breathing techniques             Initial Exercise Prescription:  Initial Exercise Prescription - 04/28/23 1500       Date of Initial Exercise RX and Referring Provider   Date 04/28/23    Referring Provider Dr. Belia Heman      Oxygen   Maintain Oxygen Saturation 88% or higher      Treadmill   MPH 3    Grade 0    Minutes 15    METs 3.5      NuStep   Level 2   T6   SPM 80    Minutes 15    METs 3.45      Elliptical   Level 1    Speed 2.5    Minutes 15    METs 3.45      REL-XR   Level 2    Speed 50    Minutes 15    METs 3.45      T5 Nustep   Level 2    SPM 80    Minutes 15    METs 3.45      Prescription Details   Frequency (times per week) 3    Duration Progress to 30 minutes of continuous aerobic without signs/symptoms of physical distress      Intensity   THRR 40-80% of Max Heartrate 115-138    Ratings of Perceived Exertion 11-13    Perceived Dyspnea 0-4      Progression   Progression Continue to progress workloads to maintain intensity without signs/symptoms of physical distress.      Resistance Training   Training Prescription Yes    Weight 4    Reps 10-15             Perform Capillary Blood Glucose checks as needed.  Exercise Prescription Changes:   Exercise Prescription Changes     Row Name 04/28/23 1500 05/08/23 1600 05/22/23 1700 06/05/23 1400 06/19/23 1300     Response to Exercise   Blood Pressure (Admit) 122/72 112/60 122/62 134/70 122/62   Blood Pressure (Exercise) 158/80 116/74 170/70 160/70 158/60   Blood Pressure (Exit) 118/62 110/60 102/60 110/70 108/52   Heart Rate (Admit) 93 bpm 112 bpm 78 bpm 82 bpm 94 bpm   Heart Rate (Exercise) 130 bpm 128 bpm 132 bpm 132 bpm 124 bpm   Heart Rate (Exit) 101 bpm 113 bpm 126 bpm 105 bpm 130 bpm    Oxygen Saturation (Admit) 95 % 95 % 99 % 98 % 92 %   Oxygen Saturation (Exercise) 93 % 90 % 90 % 81 % 88 %   Oxygen Saturation (Exit) 94 % 98 % 95 % 97 % 92 %   Rating of Perceived Exertion (Exercise) 13 13 15 15 12    Perceived Dyspnea (Exercise) 0 0 0 1 3   Symptoms none none none none none   Comments 6 MWT results First week of exercise -- -- --   Duration -- Progress to 30 minutes of  aerobic without signs/symptoms of physical distress Progress to 30 minutes of  aerobic without signs/symptoms of physical distress Progress to 30 minutes of  aerobic without signs/symptoms of physical distress Progress to 30 minutes of  aerobic without signs/symptoms of physical distress   Intensity -- THRR unchanged THRR unchanged THRR unchanged THRR unchanged     Progression   Progression -- Continue to progress workloads to maintain intensity without signs/symptoms of physical distress. Continue to progress workloads to maintain intensity without signs/symptoms of physical distress. Continue to progress workloads to maintain intensity without signs/symptoms of physical distress. Continue to progress workloads to maintain intensity without signs/symptoms of physical distress.   Average METs -- 3.72 3.96 3.8 3.31     Resistance Training   Training Prescription -- Yes Yes Yes Yes   Weight -- 4 4 5  lb 5 lb   Reps -- 10-15 10-15 10-15 10-15     Interval Training   Interval Training -- No No No No     Treadmill   MPH -- 2.8 3 3 3    Grade -- 1.5 0.5 0.5 0.5   Minutes -- 15 15 15 15    METs -- 3.72 3.5 3.5 3.5     Recumbant Bike   Level -- -- -- -- 3   Watts -- -- -- -- 22   Minutes -- -- -- -- 15   METs -- -- -- -- 2.73     NuStep   Level -- 5 5 -- --   Minutes -- 15 15 -- --   METs -- 4.2 4.2 -- --     Elliptical   Level -- -- 1 1  10  min 1   Speed -- -- 2.5 2.5 4.3   Minutes -- -- 15 15 10      REL-XR   Level -- -- 5 5 --   Minutes -- -- 15 15 --   METs -- -- 5.6 7.1 --     T5 Nustep    Level -- 2 2 3 3    Minutes -- 15 15 15 15    METs -- -- -- 3.1 3.1     Oxygen   Maintain Oxygen Saturation -- 88% or higher 88% or higher 88% or higher 88% or higher    Row Name 07/03/23 0900 07/16/23 1100 07/31/23 1100 07/31/23 1700       Response to Exercise   Blood Pressure (Admit) 122/62 126/64 -- 118/62    Blood Pressure (Exit) 110/62 110/60 -- 114/60    Heart Rate (Admit) 103 bpm 117 bpm -- 96 bpm    Heart Rate (Exercise) 152 bpm 140 bpm -- 137 bpm    Heart Rate (Exit) 115 bpm 116 bpm -- 115 bpm    Oxygen Saturation (Admit) 97 % 90 % -- 98 %    Oxygen Saturation (Exercise) 92 % 94 % -- 89 %    Oxygen Saturation (Exit) 97 % 97 % -- 98 %    Rating of Perceived Exertion (Exercise) 15 12 -- 15    Perceived Dyspnea (Exercise) 1 1 -- 0    Symptoms none none -- none    Duration Continue with 30 min of aerobic exercise without signs/symptoms of physical distress. Continue with 30 min of aerobic exercise without signs/symptoms of physical distress. -- Continue with 30 min of aerobic exercise without signs/symptoms of physical distress.    Intensity THRR unchanged THRR unchanged -- THRR unchanged      Progression   Progression Continue to progress workloads to maintain intensity without signs/symptoms of physical distress. Continue to progress workloads to maintain intensity without signs/symptoms of physical distress. -- Continue to progress workloads to maintain intensity without signs/symptoms of physical distress.  Average METs 3.48 3.5 -- 3.6      Resistance Training   Training Prescription Yes Yes -- Yes    Weight 5 lb 5 lb -- 5 lb    Reps 10-15 10-15 -- 10-15      Interval Training   Interval Training No No -- No      Treadmill   MPH 3 3 -- 3    Grade 0.5 0.5 -- 1    Minutes 15 15 -- 15    METs 3.5 3.5 -- 3.71      NuStep   Level 3 6 -- --    Minutes 15 15 -- --    METs 3.1 -- -- --      Elliptical   Level 1 -- -- 1    Speed 4.4 -- -- 4.4    Minutes 15 -- -- 15       REL-XR   Level 5 -- -- 6    Minutes 15 -- -- 15      T5 Nustep   Level -- 4 -- --    Minutes -- 15 -- --    METs -- 3.6 -- --      Biostep-RELP   Level 1 -- -- --    Minutes 15 -- -- --    METs 4 -- -- --      Home Exercise Plan   Plans to continue exercise at -- -- [employer] (comment)  [employer], water aerobics, aerobic machines, weight machines and [employer] (comment)  [employer], water aerobics, aerobic machines, weight machines and handweights    Frequency -- -- Add 2 additional days to program exercise sessions. Add 2 additional days to program exercise sessions.    Initial Home Exercises Provided -- -- 07/31/23 07/31/23      Oxygen   Maintain Oxygen Saturation 88% or higher 88% or higher 88% or higher 88% or higher             Exercise Comments:   Exercise Goals and Review:   Exercise Goals     Row Name 04/28/23 1509             Exercise Goals   Increase Physical Activity Yes       Intervention Provide advice, education, support and counseling about physical activity/exercise needs.;Develop an individualized exercise prescription for aerobic and resistive training based on initial evaluation findings, risk stratification, comorbidities and participant's personal goals.       Expected Outcomes Short Term: Attend rehab on a regular basis to increase amount of physical activity.;Long Term: Add in home exercise to make exercise part of routine and to increase amount of physical activity.;Long Term: Exercising regularly at least 3-5 days a week.       Increase Strength and Stamina Yes       Intervention Provide advice, education, support and counseling about physical activity/exercise needs.;Develop an individualized exercise prescription for aerobic and resistive training based on initial evaluation findings, risk stratification, comorbidities and participant's personal goals.       Expected Outcomes Short Term: Increase workloads from initial  exercise prescription for resistance, speed, and METs.;Short Term: Perform resistance training exercises routinely during rehab and add in resistance training at home;Long Term: Improve cardiorespiratory fitness, muscular endurance and strength as measured by increased METs and functional capacity ( )       Able to understand and use rate of perceived exertion (RPE) scale Yes       Intervention Provide education and  explanation on how to use RPE scale       Expected Outcomes Short Term: Able to use RPE daily in rehab to express subjective intensity level;Long Term:  Able to use RPE to guide intensity level when exercising independently       Able to understand and use Dyspnea scale Yes       Intervention Provide education and explanation on how to use Dyspnea scale       Expected Outcomes Short Term: Able to use Dyspnea scale daily in rehab to express subjective sense of shortness of breath during exertion;Long Term: Able to use Dyspnea scale to guide intensity level when exercising independently       Knowledge and understanding of Target Heart Rate Range (THRR) Yes       Intervention Provide education and explanation of THRR including how the numbers were predicted and where they are located for reference       Expected Outcomes Short Term: Able to state/look up THRR;Long Term: Able to use THRR to govern intensity when exercising independently;Short Term: Able to use daily as guideline for intensity in rehab       Able to check pulse independently Yes       Intervention Provide education and demonstration on how to check pulse in carotid and radial arteries.;Review the importance of being able to check your own pulse for safety during independent exercise       Expected Outcomes Short Term: Able to explain why pulse checking is important during independent exercise;Long Term: Able to check pulse independently and accurately       Understanding of Exercise Prescription Yes       Intervention  Provide education, explanation, and written materials on patient's individual exercise prescription       Expected Outcomes Short Term: Able to explain program exercise prescription;Long Term: Able to explain home exercise prescription to exercise independently                Exercise Goals Re-Evaluation :  Exercise Goals Re-Evaluation     Row Name 05/08/23 1647 05/22/23 1712 06/05/23 1443 06/19/23 1343 06/24/23 1134     Exercise Goal Re-Evaluation   Exercise Goals Review Increase Physical Activity;Increase Strength and Stamina;Understanding of Exercise Prescription Increase Physical Activity;Increase Strength and Stamina;Understanding of Exercise Prescription Increase Physical Activity;Increase Strength and Stamina;Understanding of Exercise Prescription Increase Physical Activity;Increase Strength and Stamina;Understanding of Exercise Prescription Increase Physical Activity   Comments Desra is off to a good start in the program. She attended 2 sessions during this review, and was able to use the treadmill, T4, and T5 nustep. We will continue to monitor her progress in the program. Dennie Bible continues to make improvements at rehab. She has recently increased her level on the XR from 2 to 5. She has also added 0.5% incline to her prescribed on the treadmill. We will continue to monitor her progress in the program. Dennie Bible continues to do well in rehab. She has been able to increase her hand weights from 4 to 5lb, and increase her level on the T5 nustep from level 2 to 3. She has also been able to maintain a consistent workload on the treadmill, and continues to try to use the elliptical for the full 20 minutes. We will continue to monitor her progress in the program. Dennie Bible continues to do well in rehab. She has recently been able to increase her speed on the elliptical from 2.5 to 4.3 mph. She has also been able to maintain  a consistent intensity on the treadmill and T5 nustep. We will continue to monitor  her progress in the program. [facility] been a part of this class for three years and really enjoys the challenge and community. She continues to increase her levels on her machines every few sessions.   Expected Outcomes Short: Continue to follow current exercise prescription. Long: Continue exercise to improve strength and stamina. Short: Continue to follow current exercise prescription. Long: Continue exercise to improve strength and stamina. Short: Continue to work to improve time on elliptical. Long: Continue exercise to improve strength and stamina. Short: Continue to work to improve time on elliptical. Long: Continue exercise to improve strength and stamina. Short: continue to attend water aerobics and the program consistenlty. Long: independently manage exercise consistently.    Row Name 07/03/23 0913 07/16/23 1147 07/31/23 1131 07/31/23 1725       Exercise Goal Re-Evaluation   Exercise Goals Review Increase Physical Activity;Increase Strength and Stamina;Knowledge and understanding of Target Heart Rate Range (THRR) Increase Physical Activity;Increase Strength and Stamina;Knowledge and understanding of Target Heart Rate Range (THRR) Increase Physical Activity;Increase Strength and Stamina;Knowledge and understanding of Target Heart Rate Range (THRR);Able to understand and use Dyspnea scale;Understanding of Exercise Prescription;Able to check pulse independently;Able to understand and use rate of perceived exertion (RPE) scale Increase Physical Activity;Increase Strength and Stamina;Understanding of Exercise Prescription    Comments Dennie Bible is doing well in rehab. She has stayed consistent with her treadmill workload at a speed of 3 mph with an incline of 0.5%. She also was able to do the elliptical for 15 minutes after previously only being able to do 10 minutes. She has stayed consistent with her workloads on seated machines as well. We will continue  to monitor her progress in the program. Dennie Bible continues to do well in rehab. She has been able to increase her level on the T4 nustep from level 3 to 6. She also increased her level on the T5 nustep from level 3 to 4. We will continue to monitor her progress in the program. Reviewed home exercise with pt today from 11:20 to 11:29.  Pt plans to go to the [facility] and attend water aerobics class 3 times a week and use aerobic machines 2 days a week for exercise.  Reviewed THR, pulse, RPE, sign and symptoms, pulse oximetery and when to call 911 or MD.  Also discussed weather considerations and indoor options.  Pt voiced understanding. Dennie Bible is doing well in rehab. She was able to increase her level on the XR from level 5 to 6. She was also able to increase her grade on the treadmill from 0.5% to 1%. We will continue to monitor her progress in the program.    Expected Outcomes Short: Continue to progressively increase treadmill workload. Long: Continue exercise to improve strength and stamina. Short: Continue to progressively increase treadmill workload. Long: Continue exercise to improve strength and stamina. Short: Continue to go to the [facility] on days away from rehab. Long: Continue to exercise independently. Short: Continue progressively increasing treadmill workloads. Long: Continue exercise to improve strength and stamina.             Discharge Exercise Prescription (Final Exercise Prescription Changes):  Exercise Prescription Changes - 07/31/23 1700       Response to Exercise   Blood Pressure (Admit) 118/62    Blood Pressure (Exit) 114/60    Heart Rate (Admit) 96 bpm  Heart Rate (Exercise) 137 bpm    Heart Rate (Exit) 115 bpm    Oxygen Saturation (Admit) 98 %    Oxygen Saturation (Exercise) 89 %    Oxygen Saturation (Exit) 98 %    Rating of Perceived Exertion (Exercise) 15    Perceived Dyspnea (Exercise) 0    Symptoms none    Duration Continue with 30 min of aerobic exercise without signs/symptoms  of physical distress.    Intensity THRR unchanged      Progression   Progression Continue to progress workloads to maintain intensity without signs/symptoms of physical distress.    Average METs 3.6      Resistance Training   Training Prescription Yes    Weight 5 lb    Reps 10-15      Interval Training   Interval Training No      Treadmill   MPH 3    Grade 1    Minutes 15    METs 3.71      Elliptical   Level 1    Speed 4.4    Minutes 15      REL-XR   Level 6    Minutes 15      Home Exercise Plan   Plans to continue exercise at [employer] (comment)   [employer], water aerobics, aerobic machines, weight machines and handweights   Frequency Add 2 additional days to program exercise sessions.    Initial Home Exercises Provided 07/31/23      Oxygen   Maintain Oxygen Saturation 88% or higher             Nutrition:  Target Goals: Understanding of nutrition guidelines, daily intake of sodium 1500mg , cholesterol 200mg , calories 30% from fat and 7% or less from saturated fats, daily to have 5 or more servings of fruits and vegetables.  Education: All About Nutrition: -Group instruction provided by verbal, written material, interactive activities, discussions, models, and posters to present general guidelines for heart healthy nutrition including fat, fiber, MyPlate, the role of sodium in heart healthy nutrition, utilization of the nutrition label, and utilization of this knowledge for meal planning. Follow up email sent as well. Written material given at graduation. Flowsheet Row Pulmonary Rehab from 07/03/2023 in [facility] Cardiac and Pulmonary Rehab  Education need identified 04/28/23  Date 07/03/23  Educator JG  Instruction Review Code 1- Verbalizes Understanding       Biometrics:  Pre Biometrics - 04/28/23 1511       Pre Biometrics   Height 6' (1.829 m)    Weight 210 lb (95.3 kg)    Waist Circumference 36 inches    Hip Circumference 47 inches    Waist to  Hip Ratio 0.77 %    BMI (Calculated) 28.47    Single Leg Stand 9.43 seconds              Nutrition Therapy Plan and Nutrition Goals:  Nutrition Therapy & Goals - 04/28/23 1511       Nutrition Therapy   Diet Cardiac, Low Na    Protein (specify units) 90    Fiber 25 grams    Whole Grain Foods 3 servings    Saturated Fats 15 max. grams    Fruits and Vegetables 5 servings/day    Sodium 2 grams      Personal Nutrition Goals   Nutrition Goal Eat a small but nutrient dense breakfast most days the week    Personal Goal #2 Eat a protein and carb at each meal  Personal Goal #3 Cut back on sweets, look for healthier snacks    Comments Patient drinking water and coffee only. She reports she often skips lunch due to eating breakfast late, around 11am. She reports she wakes up early and goes to water aerobics without eating breakfast. Brainstormed things to make breakfast more achievable for her, focus was on smaller portions, quick to make, grab and go friendly. Educated on what makes a good meal and snack. Set goal to pair protein and carbs together at each meal. She finds herself snacking on sweets often, talked about better snacks to try more often, reducing the frequency of these sugary foods. Reviewed mediterranean diet handout, types of fats, sources, how to read label. Built out several meals and snacks with foods she likes and will eat. Focus on smaller portions, healthy fats and reducing sugar intake from poor food choices. She is very optimistic about making changes to her eating structure and will work on consistency.      Intervention Plan   Intervention Prescribe, educate and counsel regarding individualized specific dietary modifications aiming towards targeted core components such as weight, hypertension, lipid management, diabetes, heart failure and other comorbidities.;Nutrition handout(s) given to patient.    Expected Outcomes Long Term Goal: Adherence to prescribed nutrition  plan.;Short Term Goal: A plan has been developed with personal nutrition goals set during dietitian appointment.;Short Term Goal: Understand basic principles of dietary content, such as calories, fat, sodium, cholesterol and nutrients.             Nutrition Assessments:  MEDIFICTS Score Key: >=70 Need to make dietary changes  40-70 Heart Healthy Diet <= 40 Therapeutic Level Cholesterol Diet  Flowsheet Row Pulmonary Rehab from 04/28/2023 in [facility] Cardiac and Pulmonary Rehab  Picture Your Plate Total Score on Admission 48      Picture Your Plate Scores: <36 Unhealthy dietary pattern with much room for improvement. 41-50 Dietary pattern unlikely to meet recommendations for good health and room for improvement. 51-60 More healthful dietary pattern, with some room for improvement.  >60 Healthy dietary pattern, although there may be some specific behaviors that could be improved.   Nutrition Goals Re-Evaluation:  Nutrition Goals Re-Evaluation     Row Name 06/05/23 1124 06/24/23 1131           Goals   Current Weight 256 lb (116.1 kg) --      Comment Patient was informed on why it is important to maintain a balanced diet when dealing with Respiratory issues. Explained that it takes a lot of energy to breath and when they are short of breath often they will need to have a good diet to help keep up with the calories they are expending for breathing. Dennie Bible reports that she has been trying to incoporate ideas from her meeting with the RD. She states she has cravings for sugar and carbs but she has just not been buying her typical snacks and she is strengthening her self will to resist over eating.      Expected Outcome Short: Choose and plan snacks accordingly to patients caloric intake to improve breathing. Long: Maintain a diet independently that meets their caloric intake to aid in daily shortness of breath. Short: continue to incoporate healthy snacks into her day to day planning. Long:  independenty manage healthy eating.               Nutrition Goals Discharge (Final Nutrition Goals Re-Evaluation):  Nutrition Goals Re-Evaluation - 06/24/23 1131  Goals   Comment Dennie Bible reports that she has been trying to incoporate ideas from her meeting with the RD. She states she has cravings for sugar and carbs but she has just not been buying her typical snacks and she is strengthening her self will to resist over eating.    Expected Outcome Short: continue to incoporate healthy snacks into her day to day planning. Long: independenty manage healthy eating.             Psychosocial: Target Goals: Acknowledge presence or absence of significant depression and/or stress, maximize coping skills, provide positive support system. Participant is able to verbalize types and ability to use techniques and skills needed for reducing stress and depression.   Education: Stress, Anxiety, and Depression - Group verbal and visual presentation to define topics covered.  Reviews how body is impacted by stress, anxiety, and depression.  Also discusses healthy ways to reduce stress and to treat/manage anxiety and depression.  Written material given at graduation. Flowsheet Row Pulmonary Rehab from 07/03/2023 in [facility] Cardiac and Pulmonary Rehab  Date 05/29/23  Educator SB  Instruction Review Code 1- [employer] Understanding       Education: Sleep Hygiene -Provides group verbal and written instruction about how sleep can affect your health.  Define sleep hygiene, discuss sleep cycles and impact of sleep habits. Review good sleep hygiene tips.    Initial Review & Psychosocial Screening:  Initial Psych Review & Screening - 04/14/23 1020       Initial Review   Current issues with None Identified;Current Anxiety/Panic;History of Depression      Family Dynamics   Good Support System? Yes    Comments SHe has a good support system. She can look to her sons for support and her youngest son  lives with her. Sometiems she has anxiet over the normal stresses of life but nothing significant.      Barriers   Psychosocial barriers to participate in program The patient should benefit from training in stress management and relaxation.;There are no identifiable barriers or psychosocial needs.      Screening Interventions   Interventions Encouraged to exercise;To provide support and resources with identified psychosocial needs;Provide feedback about the scores to participant    Expected Outcomes Short Term goal: Utilizing psychosocial counselor, staff and physician to assist with identification of specific Stressors or current issues interfering with healing process. Setting desired goal for each stressor or current issue identified.;Long Term Goal: Stressors or current issues are controlled or eliminated.;Short Term goal: Identification and review with participant of any Quality of Life or Depression concerns found by scoring the questionnaire.;Long Term goal: The participant improves quality of Life and PHQ9 Scores as seen by post scores and/or verbalization of changes             Quality of Life Scores:  Scores of 19 and below usually indicate a poorer quality of life in these areas.  A difference of  2-3 points is a clinically meaningful difference.  A difference of 2-3 points in the total score of the Quality of Life Index has been associated with significant improvement in overall quality of life, self-image, physical symptoms, and general health in studies assessing change in quality of life.  PHQ-9: Review Flowsheet  More data exists      04/28/2023 03/25/2023 03/14/2023 11/19/2022 10/31/2022  Depression screen PHQ 2/9  Decreased Interest 0 0 0 0 0  Down, Depressed, Hopeless 0 0 0 0 0  PHQ - 2 Score 0 0  0 0 0  Altered sleeping 0 0 0 0 0  Tired, decreased energy 1 0 0 1 1  Change in appetite 0 0 0 0 0  Feeling bad or failure about yourself  0 0 0 0 0  Trouble concentrating 1 0 0 0  0  Moving slowly or fidgety/restless 0 0 0 0 0  Suicidal thoughts 0 0 0 0 0  PHQ-9 Score 2 0 0 1 1  Difficult doing work/chores Not difficult at all - - - -   Interpretation of Total Score  Total Score Depression Severity:  1-4 = Minimal depression, 5-9 = Mild depression, 10-14 = Moderate depression, 15-19 = Moderately severe depression, 20-27 = Severe depression   Psychosocial Evaluation and Intervention:  Psychosocial Evaluation - 04/14/23 1022       Psychosocial Evaluation & Interventions   Interventions Encouraged to exercise with the program and follow exercise prescription;Relaxation education;Stress management education    Comments SHe has a good support system. She can look to her sons for support and her youngest son lives with her. Sometiems she has anxiet over the normal stresses of life but nothing significant.    Expected Outcomes Short: Start [facility] to help with mood. Long: Maintain a healthy mental state.    Continue Psychosocial Services  Follow up required by staff             Psychosocial Re-Evaluation:  Psychosocial Re-Evaluation     Row Name 06/05/23 1126 06/24/23 1123           Psychosocial Re-Evaluation   Current issues with Current Psychotropic Meds;Current Anxiety/Panic;History of Depression Current Psychotropic Meds;Current Anxiety/Panic;History of Depression      Comments Dennie Bible has some anxiety and takes medication for it. In the last few weeks she has not had any mood changes. She reports no issues with their current mental states, sleep, stress, depression or anxiety. Will follow up with patient in a few weeks for any changes. Dennie Bible is meeting with her therapist regularly to help manage her depression and anxiety. She states she has had two family members that have caused an increase in stress and she thinks that they caused a lot of issues related to her blood pressure. Now that they both are no longer involved in her life, she notes that her pressure  has been much better and they have taken her off her blood pressure medications. Her therapist has helped her talk through the issues with her sister that is one of the family members that have caused her stress. They were supposed to go on a cruise together in a few weeks, but her sister decided not to go. Dennie Bible decided to continue with the cruise plans and this is her first solo time traveling. She is really excited about her trip and her kids are proud of her for planning on going. She continues to attend her water aerobics class that she has been a part of for the last three years and has talked to another patient about joining her. She really enjoys the community of friends and how it makes her feel. Dennie Bible wants to continue to take care of her health because she said she had kids later in life and wants to be healthy when she has grandkids      Expected Outcomes Short: Continue to exercise regularly to support mental health and notify staff of any changes. Long: maintain mental health and well being through teaching of rehab or prescribed medications independently. Short: continue to  attend the program to increase her stamina and positively influence her mental health. Long; maintain positive self care habits.      Interventions Encouraged to attend Pulmonary Rehabilitation for the exercise Encouraged to attend Pulmonary Rehabilitation for the exercise      Continue Psychosocial Services  Follow up required by staff Follow up required by staff               Psychosocial Discharge (Final Psychosocial Re-Evaluation):  Psychosocial Re-Evaluation - 06/24/23 1123       Psychosocial Re-Evaluation   Current issues with Current Psychotropic Meds;Current Anxiety/Panic;History of Depression    Comments Dennie Bible is meeting with her therapist regularly to help manage her depression and anxiety. She states she has had two family members that have caused an increase in stress and she thinks that they caused a lot of  issues related to her blood pressure. Now that they both are no longer involved in her life, she notes that her pressure has been much better and they have taken her off her blood pressure medications. Her therapist has helped her talk through the issues with her sister that is one of the family members that have caused her stress. They were supposed to go on a cruise together in a few weeks, but her sister decided not to go. Dennie Bible decided to continue with the cruise plans and this is her first solo time traveling. She is really excited about her trip and her kids are proud of her for planning on going. She continues to attend her water aerobics class that she has been a part of for the last three years and has talked to another patient about joining her. She really enjoys the community of friends and how it makes her feel. Dennie Bible wants to continue to take care of her health because she said she had kids later in life and wants to be healthy when she has grandkids    Expected Outcomes Short: continue to attend the program to increase her stamina and positively influence her mental health. Long; maintain positive self care habits.    Interventions Encouraged to attend Pulmonary Rehabilitation for the exercise    Continue Psychosocial Services  Follow up required by staff             Education: Education Goals: Education classes will be provided on a weekly basis, covering required topics. Participant will state understanding/return demonstration of topics presented.  Learning Barriers/Preferences:  Learning Barriers/Preferences - 04/14/23 1018       Learning Barriers/Preferences   Learning Barriers None    Learning Preferences None             General Pulmonary Education Topics:  Infection Prevention: - Provides verbal and written material to individual with discussion of infection control including proper hand washing and proper equipment cleaning during exercise session. Flowsheet Row  Pulmonary Rehab from 07/03/2023 in [facility] Cardiac and Pulmonary Rehab  Date 04/28/23  Educator [facility]  Instruction Review Code 1- Verbalizes Understanding       Falls Prevention: - Provides verbal and written material to individual with discussion of falls prevention and safety. Flowsheet Row Pulmonary Rehab from 07/03/2023 in [facility] Cardiac and Pulmonary Rehab  Date 04/28/23  Educator [facility]  Instruction Review Code 1- Verbalizes Understanding       Chronic Lung Disease Review: - Group verbal instruction with posters, models, PowerPoint presentations and videos,  to review new updates, new respiratory medications, new advancements in procedures and treatments. Providing information on  websites and "800" numbers for continued self-education. Includes information about supplement oxygen, available portable oxygen systems, continuous and intermittent flow rates, oxygen safety, concentrators, and Medicare reimbursement for oxygen. Explanation of Pulmonary Drugs, including class, frequency, complications, importance of spacers, rinsing mouth after steroid MDI's, and proper cleaning methods for nebulizers. Review of basic lung anatomy and physiology related to function, structure, and complications of lung disease. Review of risk factors. Discussion about methods for diagnosing sleep apnea and types of masks and machines for OSA. Includes a review of the use of types of environmental controls: home humidity, furnaces, filters, dust mite/pet prevention, HEPA vacuums. Discussion about weather changes, air quality and the benefits of nasal washing. Instruction on Warning signs, infection symptoms, calling MD promptly, preventive modes, and value of vaccinations. Review of effective airway clearance, coughing and/or vibration techniques. Emphasizing that all should Create an Action Plan. Written material given at graduation. Flowsheet Row Pulmonary Rehab from 07/03/2023 in [facility] Cardiac and Pulmonary Rehab  Education  need identified 04/28/23  Date 05/15/23  Educator [facility]  Instruction Review Code 1- Verbalizes Understanding       AED/CPR: - Group verbal and written instruction with the use of models to demonstrate the basic use of the AED with the basic ABC's of resuscitation.    Anatomy and Cardiac Procedures: - Group verbal and visual presentation and models provide information about basic cardiac anatomy and function. Reviews the testing methods done to diagnose heart disease and the outcomes of the test results. Describes the treatment choices: Medical Management, Angioplasty, or Coronary Bypass Surgery for treating various heart conditions including Myocardial Infarction, Angina, Valve Disease, and Cardiac Arrhythmias.  Written material given at graduation.   Medication Safety: - Group verbal and visual instruction to review commonly prescribed medications for heart and lung disease. Reviews the medication, class of the drug, and side effects. Includes the steps to properly store meds and maintain the prescription regimen.  Written material given at graduation.   Other: -Provides group and verbal instruction on various topics (see comments)   Knowledge Questionnaire Score:  Knowledge Questionnaire Score - 04/28/23 1513       Knowledge Questionnaire Score   Pre Score 11/18              Core Components/Risk Factors/Patient Goals at Admission:  Personal Goals and Risk Factors at Admission - 04/28/23 1512       Core Components/Risk Factors/Patient Goals on Admission    Weight Management Yes;Weight Loss    Intervention Weight Management/Obesity: Establish reasonable short term and long term weight goals.;Weight Management: Provide education and appropriate resources to help participant work on and attain dietary goals.;Weight Management: Develop a combined nutrition and exercise program designed to reach desired caloric intake, while maintaining appropriate intake of nutrient and fiber,  sodium and fats, and appropriate energy expenditure required for the weight goal.    Admit Weight 210 lb (95.3 kg)    Goal Weight: Short Term 200 lb (90.7 kg)    Goal Weight: Long Term 180 lb (81.6 kg)    Expected Outcomes Short Term: Continue to assess and modify interventions until short term weight is achieved;Long Term: Adherence to nutrition and physical activity/exercise program aimed toward attainment of established weight goal;Weight Loss: Understanding of general recommendations for a balanced deficit meal plan, which promotes 1-2 lb weight loss per week and includes a negative energy balance of [id number] kcal/d;Understanding recommendations for meals to include 15-35% energy as protein, 25-35% energy from fat, 35-60% energy from carbohydrates, less  than 200mg  of dietary cholesterol, 20-35 gm of total fiber daily;Understanding of distribution of calorie intake throughout the day with the consumption of 4-5 meals/snacks    Improve shortness of breath with ADL's Yes    Intervention Provide education, individualized exercise plan and daily activity instruction to help decrease symptoms of SOB with activities of daily living.    Expected Outcomes Short Term: Improve cardiorespiratory fitness to achieve a reduction of symptoms when performing ADLs;Long Term: Be able to perform more ADLs without symptoms or delay the onset of symptoms    Hypertension Yes    Intervention Provide education on lifestyle modifcations including regular physical activity/exercise, weight management, moderate sodium restriction and increased consumption of fresh fruit, vegetables, and low fat dairy, alcohol moderation, and smoking cessation.;Monitor prescription use compliance.    Expected Outcomes Short Term: Continued assessment and intervention until BP is < 140/24mm HG in hypertensive participants. < 130/96mm HG in hypertensive participants with diabetes, heart failure or chronic kidney disease.;Long Term: Maintenance of  blood pressure at goal levels.    Lipids Yes    Intervention Provide education and support for participant on nutrition & aerobic/resistive exercise along with prescribed medications to achieve LDL 70mg , HDL >40mg .    Expected Outcomes Short Term: Participant states understanding of desired cholesterol values and is compliant with medications prescribed. Participant is following exercise prescription and nutrition guidelines.;Long Term: Cholesterol controlled with medications as prescribed, with individualized exercise RX and with personalized nutrition plan. Value goals: LDL < 70mg , HDL > 40 mg.             Education:Diabetes - Individual verbal and written instruction to review signs/symptoms of diabetes, desired ranges of glucose level fasting, after meals and with exercise. Acknowledge that pre and post exercise glucose checks will be done for 3 sessions at entry of program.   Know Your Numbers and Heart Failure: - Group verbal and visual instruction to discuss disease risk factors for cardiac and pulmonary disease and treatment options.  Reviews associated critical values for Overweight/Obesity, Hypertension, Cholesterol, and Diabetes.  Discusses basics of heart failure: signs/symptoms and treatments.  Introduces Heart Failure Zone chart for action plan for heart failure.  Written material given at graduation. Flowsheet Row Pulmonary Rehab from 07/03/2023 in [facility] Cardiac and Pulmonary Rehab  Education need identified 04/28/23  Date 05/22/23  Educator SB  Instruction Review Code 1- Verbalizes Understanding       Core Components/Risk Factors/Patient Goals Review:   Goals and Risk Factor Review     Row Name 06/05/23 1123 06/24/23 1120           Core Components/Risk Factors/Patient Goals Review   Personal Goals Review Improve shortness of breath with ADL's Hypertension;Improve shortness of breath with ADL's      Review Spoke to patient about their shortness of breath and what  they can do to improve. Patient has been informed of breathing techniques when starting the program. Patient is informed to tell staff if they have had any med changes and that certain meds they are taking or not taking can be causing shortness of breath. Pat's blood pressure medication was stopped. She notes that her pressures have been stable since then. She thinks a lot of her blood pressure issues were coming from family stressors and now that those have been removed, her blood pressure has been more stable. She is encouraged to check her blood pressure regularly with her home blood pressure monitor. She has been working on her breathing techniques while in  the program and has noted some increase her stamina. She is hopeful that coming regularly and continuing to do her water aerobics will help keep her motivated to keep up her hard work.      Expected Outcomes Short: Attend [facility] regularly to improve shortness of breath with ADL's. Long: maintain independence with ADL's Short: attend the program consistently to work on her blood pressure managment and breathing techniques. Long: independently manage her risk factors               Core Components/Risk Factors/Patient Goals at Discharge (Final Review):   Goals and Risk Factor Review - 06/24/23 1120       Core Components/Risk Factors/Patient Goals Review   Personal Goals Review Hypertension;Improve shortness of breath with ADL's    Review Pat's blood pressure medication was stopped. She notes that her pressures have been stable since then. She thinks a lot of her blood pressure issues were coming from family stressors and now that those have been removed, her blood pressure has been more stable. She is encouraged to check her blood pressure regularly with her home blood pressure monitor. She has been working on her breathing techniques while in the program and has noted some increase her stamina. She is hopeful that coming regularly and  continuing to do her water aerobics will help keep her motivated to keep up her hard work.    Expected Outcomes Short: attend the program consistently to work on her blood pressure managment and breathing techniques. Long: independently manage her risk factors             ITP Comments:  ITP Comments     Row Name 04/14/23 1016 04/28/23 1458 05/21/23 0952 06/18/23 0838 07/09/23 1128   ITP Comments Virtual Visit completed. Patient informed on EP and RD appointment and 6 Minute walk test. Patient also informed of patient health questionnaires on My Chart. Patient Verbalizes understanding. Visit diagnosis can be found in [facility] 09/30/2022. Completed and gym orientation. Initial ITP created and sent for review to Dr. Jinny Sanders, Medical Director. 30 Day review completed. Medical Director ITP review done, changes made as directed, and signed approval by Medical Director.    new to program 30 Day review completed. Medical Director ITP review done, changes made as directed, and signed approval by Medical Director. 30 Day review completed. Medical Director ITP review done, changes made as directed, and signed approval by Medical Director.    Row Name 08/06/23 0945           ITP Comments 30 Day review completed. Medical Director ITP review done, changes made as directed, and signed approval by Medical Director.                Comments:

## 2023-08-07 ENCOUNTER — Encounter: Payer: Medicare Other | Admitting: *Deleted

## 2023-08-14 ENCOUNTER — Encounter: Payer: Medicare Other | Admitting: *Deleted

## 2023-08-17 NOTE — Progress Notes (Deleted)
[name], [name] [address]  [address]  [place], [place] [address]  Main: [phone]  Fax: [phone]   Primary Care Physician: Alba Cory, MD  Primary Gastroenterologist:  ***  CC: F/U Abdominal Pain  HPI: Melissa [name] is a 72 y.o. female returns for 78-month follow-up of chronic lower abdominal pain, irritable bowel syndrome, chronic constipation, chronic anemia.  She has history of gastric bypass (2002) with history of B12 and iron deficiency.  History of chronic kidney disease and sarcoidosis.  Personal history of colon polyps and family history of colon cancer (sister).  3-year repeat colonoscopy will be due 02/2025.  3 months ago she was started on IBgard (5 minimal) 2 capsules twice daily to help with lower abdominal pain/IBS.  Continued on daily probiotic and magnesium supplement.  She declined Linzess.  05/2023 labs: Normal hemoglobin 12.4, MCV 94.  Normal vitamin B12, folate, and iron.  CTA abdomen and pelvis with and without contrast 09/2022 showed extensive atherosclerosis with no significant stenosis.  Multiple bilateral benign kidney cysts.  No acute abnormality.  Previous gastric bypass.   Colonoscopy 02/2022 -by Dr. Allegra Lai -20 mm sessile tubular adenoma polyp removed from transverse colon; 2 small (3 to 4 mm) tubular adenoma polyps removed from descending colon and cecum.  No dysplasia.  Pandiverticulosis.  3-year repeat (due 02/2025).   -Colonoscopy 02/2019 - 6 small (3 mm to 5 mm) adenomatous polyps removed.  Medium external hemorrhoids and diverticulosis. -Colonoscopy 02/2014 -2 small adenomatous polyps removed.  Internal hemorrhoids.  Sigmoid diverticulosis.  Current Outpatient Medications  Medication Sig Dispense Refill   acetaminophen (TYLENOL) 650 MG CR tablet Take 1,300 mg by mouth every 8 (eight) hours as needed for pain.     albuterol (VENTOLIN HFA) 108 (90 Base) MCG/ACT inhaler INHALE 1-2 PUFFS BY MOUTH EVERY 6 HOURS AS NEEDED FOR WHEEZE OR  SHORTNESS OF BREATH 18 each 0   amLODipine (NORVASC) 10 MG tablet Take 5 mg by mouth daily.     Ascorbic Acid (VITAMIN C) 1000 MG tablet Take 1,000 mg by mouth daily.     aspirin EC 81 MG tablet Take 1 tablet by mouth daily.     atorvastatin (LIPITOR) 40 MG tablet Take 1 tablet (40 mg total) by mouth daily. 90 tablet 1   azelastine (ASTELIN) 0.1 % nasal spray Place 2 sprays into both nostrils 2 (two) times daily. Use in each nostril as directed 30 mL 12   budesonide (PULMICORT) 0.5 MG/2ML nebulizer solution Take 2 mLs (0.5 mg total) by nebulization in the morning and at bedtime. 120 mL 6   calcium carbonate (TUMS - DOSED IN MG ELEMENTAL CALCIUM) 500 MG chewable tablet Chew 500-1,000 mg by mouth daily as needed for indigestion or heartburn.     Cholecalciferol (D 1000) 25 MCG (1000 UT) capsule Take 1,000 Units by mouth daily.     ezetimibe (ZETIA) 10 MG tablet TAKE 1 TABLET BY MOUTH EVERY DAY 90 tablet 1   fluticasone (FLONASE) 50 MCG/ACT nasal spray SPRAY 2 SPRAYS INTO EACH NOSTRIL EVERY DAY 48 mL 1   Fluticasone-Umeclidin-Vilant (TRELEGY ELLIPTA) 200-62.5-25 MCG/ACT AEPB INHALE ONE ACTUATION INTO THE LUNGS DAILY. Please schedule office visit before any future refills. 60 each 0   gabapentin (NEURONTIN) 100 MG capsule Take 100-200 mg by mouth 2 (two) times daily. One in am and two at night     ipratropium-albuterol (DUONEB) 0.5-2.5 (3) MG/3ML SOLN Take 3 mLs by nebulization every 6 (six) hours as needed. 360 mL  3   JARDIANCE 10 MG TABS tablet Take 10 mg by mouth daily.     levocetirizine (XYZAL ALLERGY 24HR) 5 MG tablet Take 1 tablet (5 mg total) by mouth every evening. 30 tablet 6   magnesium oxide (MAG-OX) 400 MG tablet Take 400 mg by mouth daily.     Multiple Vitamins-Minerals (CENTRUM SILVER) tablet Take 1 tablet by mouth daily. 30 tablet 0   nortriptyline (PAMELOR) 10 MG capsule Take 10-20 mg by mouth at bedtime.     Probiotic Product (FORTIFY PROBIOTIC WOMENS EX ST PO) Take by mouth daily.      propranolol (INDERAL) 10 MG tablet Take 1 tablet (10 mg total) by mouth 3 (three) times daily as needed. For sustained fast heart rate 60 tablet 3   zinc gluconate 50 MG tablet Take 50 mg by mouth daily.     No current facility-administered medications for this visit.    Allergies as of 08/18/2023 - Review Complete 07/31/2023  Allergen Reaction Noted   Shellfish allergy  04/17/2015    Past Medical History:  Diagnosis Date   Aortic atherosclerosis (HCC)    Asthma    Bruising    Cardiomyopathy (HCC)    Celiac artery aneurysm (HCC)    Cervical radiculopathy    CKD (chronic kidney disease), stage III (HCC)    Deficiency of vitamin B    Depression    Edema    Flank pain    Gross hematuria    History of 2019 novel coronavirus disease (COVID-19) 05/01/2019   History of bariatric surgery    HLD (hyperlipidemia)    HTN (hypertension)    Interstitial lung disease (HCC)    Iron deficiency anemia    Leukopenia    OA (osteoarthritis)    Obesity    OSA on CPAP    Osteopenia    Perennial allergic rhinitis    Pneumonia due to COVID-19 virus 2020   Renal cysts, acquired, bilateral    RLS (restless legs syndrome)    Sarcoidosis    Sickle cell trait (HCC)    Snoring    Spinal stenosis    Trigeminal neuralgia     Past Surgical History:  Procedure Laterality Date   BARIATRIC SURGERY     CESAREAN SECTION     3 or more   COLONOSCOPY WITH PROPOFOL N/A 03/15/2019   Procedure: COLONOSCOPY WITH PROPOFOL;  Surgeon: Toney Reil, MD;  Location: [facility] ENDOSCOPY;  Service: Gastroenterology;  Laterality: N/A;   COLONOSCOPY WITH PROPOFOL N/A 03/15/2022   Procedure: COLONOSCOPY WITH PROPOFOL;  Surgeon: Toney Reil, MD;  Location: [facility] ENDOSCOPY;  Service: Gastroenterology;  Laterality: N/A;   GASTRIC BYPASS     KNEE ARTHROPLASTY Right 10/18/2020   Procedure: COMPUTER ASSISTED TOTAL KNEE ARTHROPLASTY;  Surgeon: Donato Heinz, MD;  Location: [facility] ORS;  Service: Orthopedics;   Laterality: Right;   LEFT HEART CATH AND CORONARY ANGIOGRAPHY N/A 01/20/2023   Procedure: LEFT HEART CATH AND CORONARY ANGIOGRAPHY;  Surgeon: Iran Ouch, MD;  Location: [facility] INVASIVE CV LAB;  Service: Cardiovascular;  Laterality: N/A;   SHOULDER ARTHROSCOPY Right    TUBAL LIGATION      Review of Systems:    All systems reviewed and negative except where noted in HPI.   Physical Examination:   There were no vitals taken for this visit.  General: Well-nourished, well-developed in no acute distress.  Lungs: Clear to auscultation bilaterally. Non-labored. Heart: Regular rate and rhythm, no murmurs rubs or gallops.  Abdomen: Bowel  sounds are normal; Abdomen is Soft; No hepatosplenomegaly, masses or hernias;  No Abdominal Tenderness; No guarding or rebound tenderness. Neuro: Alert and oriented x 3.  Grossly intact.  Psych: Alert and cooperative, normal mood and affect.   Imaging Studies: No results found.  Assessment and Plan:   AMI CAYTON is a 72 y.o. y/o female returns for follow-up of:  1.  Chronic lower abdominal pain  Continue Ibgard  Low FODMAP diet  Continue probiotic  2.  Irritable bowel syndrome with chronic constipation  Continue magnesium supplement  Start Linzess if no better  3.  History of chronic anemia (s/p Gastric Bypass 2002); NO Current Anemia.  Reassurance regarding normal labs.  4.  Personal history of adenomatous colon polyp 5.  Family history of colon cancer (sister)  3-year repeat colonoscopy will be due 02/2025.    [name], [name]  Follow up ***  BP check ***

## 2023-08-18 ENCOUNTER — Ambulatory Visit: Payer: Medicare Other | Admitting: Physician Assistant

## 2023-08-19 ENCOUNTER — Encounter: Payer: Medicare Other | Admitting: *Deleted

## 2023-08-19 DIAGNOSIS — D860 Sarcoidosis of lung: Secondary | ICD-10-CM

## 2023-08-19 NOTE — Progress Notes (Signed)
 Daily Session Note  Patient Details  Name: Melissa [name] MRN: [id number] Date of Birth: 08-Nov-1950 Referring Provider:   Flowsheet Row Pulmonary Rehab from 04/28/2023 in [facility] Cardiac and Pulmonary Rehab  Referring Provider Dr. Isaiah       Encounter Date: 08/19/2023  Check In:  Session Check In - 08/19/23 1108       Check-In   Supervising physician immediately available to respond to emergencies See telemetry face sheet for immediately available ER MD    Location [facility]-Cardiac & Pulmonary Rehab    Staff Present Rollene Paterson, MS, Exercise Physiologist;Sendy Pluta, RN, BSN, CCRP;Meredith Tressa, RN BSN;Maxon Conetta BS, , Exercise Physiologist;Jason Elnor, RDN, LDN    Virtual Visit No    Medication changes reported     No    Fall or balance concerns reported    No    Warm-up and Cool-down Performed on first and last piece of equipment    Resistance Training Performed Yes    VAD Patient? No    PAD/SET Patient? No      Pain Assessment   Currently in Pain? No/denies                Social History   Tobacco Use  Smoking Status Never  Smokeless Tobacco Never  Tobacco Comments   Second hand smoke exposure    Goals Met:  Proper associated with RPD/PD & O2 Sat Independence with exercise equipment Exercise tolerated well No report of concerns or symptoms today  Goals Unmet:  Not Applicable  Comments: Pt able to follow exercise prescription today without complaint.  Will continue to monitor for progression.    Dr. Oneil Pinal is Medical Director for [facility] Cardiac Rehabilitation.  Dr. Fuad Aleskerov is Medical Director for [facility] Pulmonary Rehabilitation.

## 2023-08-21 ENCOUNTER — Encounter: Payer: Medicare Other | Attending: Internal Medicine | Admitting: *Deleted

## 2023-08-21 DIAGNOSIS — D860 Sarcoidosis of lung: Secondary | ICD-10-CM | POA: Diagnosis not present

## 2023-08-21 NOTE — Progress Notes (Signed)
 Daily Session Note  Patient Details  Name: Melissa [name] MRN: [id number] Date of Birth: 24-Nov-1950 Referring Provider:   Flowsheet Row Pulmonary Rehab from 04/28/2023 in [facility] Cardiac and Pulmonary Rehab  Referring Provider Dr. Isaiah       Encounter Date: 08/21/2023  Check In:  Session Check In - 08/21/23 1111       Check-In   Supervising physician immediately available to respond to emergencies See telemetry face sheet for immediately available ER MD    Location [facility]-Cardiac & Pulmonary Rehab    Staff Present Rollene Paterson, MS, Exercise Physiologist;Meredith Tressa, RN BSN;Maxon Conetta BS, , Exercise Physiologist;Maitri Schnoebelen Claudene, RN, ADN    Virtual Visit No    Medication changes reported     No    Fall or balance concerns reported    No    Warm-up and Cool-down Performed on first and last piece of equipment    Resistance Training Performed Yes    VAD Patient? No    PAD/SET Patient? No      Pain Assessment   Currently in Pain? No/denies                Social History   Tobacco Use  Smoking Status Never  Smokeless Tobacco Never  Tobacco Comments   Second hand smoke exposure    Goals Met:  Independence with exercise equipment Exercise tolerated well No report of concerns or symptoms today Strength training completed today  Goals Unmet:  Not Applicable  Comments: Pt able to follow exercise prescription today without complaint.  Will continue to monitor for progression.    Dr. Oneil Pinal is Medical Director for [facility] Cardiac Rehabilitation.  Dr. Fuad Aleskerov is Medical Director for [facility] Pulmonary Rehabilitation.

## 2023-08-26 ENCOUNTER — Encounter: Payer: Medicare Other | Admitting: *Deleted

## 2023-08-26 DIAGNOSIS — D860 Sarcoidosis of lung: Secondary | ICD-10-CM | POA: Diagnosis not present

## 2023-08-26 NOTE — Progress Notes (Signed)
 Daily Session Note  Patient Details  Name: Melissa [name] MRN: [id number] Date of Birth: 1951-04-10 Referring Provider:   Flowsheet Row Pulmonary Rehab from 04/28/2023 in [facility] Cardiac and Pulmonary Rehab  Referring Provider Dr. Isaiah       Encounter Date: 08/26/2023  Check In:  Session Check In - 08/26/23 1125       Check-In   Supervising physician immediately available to respond to emergencies See telemetry face sheet for immediately available ER MD    Location [facility]-Cardiac & Pulmonary Rehab    Staff Present Othel Durand, RN, BSN, CCRP;Meredith Tressa, RN BSN;Maxon Conetta BS, , Exercise Physiologist    Virtual Visit No    Medication changes reported     No    Fall or balance concerns reported    No    Warm-up and Cool-down Performed on first and last piece of equipment    Resistance Training Performed Yes    VAD Patient? No    PAD/SET Patient? No      Pain Assessment   Currently in Pain? No/denies                Social History   Tobacco Use  Smoking Status Never  Smokeless Tobacco Never  Tobacco Comments   Second hand smoke exposure    Goals Met:  Proper associated with RPD/PD & O2 Sat Independence with exercise equipment Exercise tolerated well No report of concerns or symptoms today  Goals Unmet:  Not Applicable  Comments: Pt able to follow exercise prescription today without complaint.  Will continue to monitor for progression.    Dr. Oneil Pinal is Medical Director for [facility] Cardiac Rehabilitation.  Dr. Fuad Aleskerov is Medical Director for [facility] Pulmonary Rehabilitation.

## 2023-08-27 ENCOUNTER — Encounter: Payer: Self-pay | Admitting: Family Medicine

## 2023-08-27 ENCOUNTER — Ambulatory Visit (INDEPENDENT_AMBULATORY_CARE_PROVIDER_SITE_OTHER): Payer: Medicare Other | Admitting: Family Medicine

## 2023-08-27 VITALS — BP 128/76 | HR 96 | Temp 97.7°F | Resp 16 | Ht 72.0 in | Wt 201.5 lb

## 2023-08-27 DIAGNOSIS — I700 Atherosclerosis of aorta: Secondary | ICD-10-CM | POA: Diagnosis not present

## 2023-08-27 DIAGNOSIS — E2839 Other primary ovarian failure: Secondary | ICD-10-CM

## 2023-08-27 DIAGNOSIS — I728 Aneurysm of other specified arteries: Secondary | ICD-10-CM

## 2023-08-27 DIAGNOSIS — G4733 Obstructive sleep apnea (adult) (pediatric): Secondary | ICD-10-CM | POA: Diagnosis not present

## 2023-08-27 DIAGNOSIS — F325 Major depressive disorder, single episode, in full remission: Secondary | ICD-10-CM

## 2023-08-27 DIAGNOSIS — D71 Functional disorders of polymorphonuclear neutrophils: Secondary | ICD-10-CM | POA: Diagnosis not present

## 2023-08-27 DIAGNOSIS — N1832 Chronic kidney disease, stage 3b: Secondary | ICD-10-CM

## 2023-08-27 DIAGNOSIS — I200 Unstable angina: Secondary | ICD-10-CM | POA: Diagnosis not present

## 2023-08-27 DIAGNOSIS — D573 Sickle-cell trait: Secondary | ICD-10-CM | POA: Diagnosis not present

## 2023-08-27 DIAGNOSIS — N2581 Secondary hyperparathyroidism of renal origin: Secondary | ICD-10-CM

## 2023-08-27 DIAGNOSIS — Z1231 Encounter for screening mammogram for malignant neoplasm of breast: Secondary | ICD-10-CM

## 2023-08-27 DIAGNOSIS — F411 Generalized anxiety disorder: Secondary | ICD-10-CM | POA: Diagnosis not present

## 2023-08-27 DIAGNOSIS — J8489 Other specified interstitial pulmonary diseases: Secondary | ICD-10-CM | POA: Diagnosis not present

## 2023-08-27 DIAGNOSIS — J4489 Other specified chronic obstructive pulmonary disease: Secondary | ICD-10-CM

## 2023-08-27 MED ORDER — ATORVASTATIN CALCIUM 40 MG PO TABS: 40.0000 mg | ORAL_TABLET | Freq: Every day | ORAL | 1 refills | Status: DC

## 2023-08-27 NOTE — Progress Notes (Signed)
 Name: Melissa [name]   MRN: [id number]    DOB: 08-04-1951   Date:08/27/2023       Progress Note  Subjective  Chief Complaint  Chief Complaint  Patient presents with   Medical Management of Chronic Issues    HPI  Atherosclerosis aorta and CAD/Celiac artery aneurysm : found on CT chest done 2021. She has been on statin therapy and aspirin , sees cardiologist , Dr. Gollan yearly. Last CT renal stone was 06/22 and size of celiac artery aneurysm 15 mm and stable, monitor every 2 -3 years. Repeat in 2024 and size is unchanged but we will refer her to vascular surgeon    OSA : she sees pulmonologist, wears CPAP every night., under the care of Dr. Isaiah    Sarcoidosis/Interstitial lung disease/OSA on CPAP  and chronic asthmatic bronchitis:  currently on Trelegy but only prn  and albuterol  prn. She is under the care of Dr. Isaiah. She has been evaluated by Rheumatologist and negative for sarcoidosis outside of lung. Denies cough, wheezing or sob. She goes to pulmonary rehab twice a week and able to do water aerobics three days a week   CKI stage III b:  history of secondary hyperparathyroidism, under the care of nephrologist, Dr. Lavinia, she is off bp medication due to hypotension and only take SGL-2 agonist for kidney protection - Jardiance 10 mg   Aneurysm celiac artery/ atherosclerosis of aorta: continue statin therapy, aspirin , going to see vascular doctor this week  Sickle cell trait: asymptomatic. Unchanged    Depression Major Depression: she took medications for a while, it was present for a few years, she is in remission.  She still sees her therapist every two weeks. She goes to water aerobics and seems to be doing well. She states her sister stresses her out but since her decided not to be part of the family her stress is down now   Angina: went to [facility] in June 2024 after an episode of chest pain that was right after her water aerobic class. Cardiac cath negative but troponin spiked a little.  Seen by cardiologist since . She is off carvedilol due to low bp , still taking zetia  and statin therapy    Cervical radiculitis: she used to get injections by Dr. Claudie, she states lately noticing pain on right side of neck that radiates to right upper arm. She denies weakness. She states current regiment is helping symptoms    Muscles spasms: stable        Patient Active Problem List   Diagnosis Date Noted   Interstitial lung disease due to granulomatous disease (HCC) 03/25/2023   Cardiomyopathy, unspecified type (HCC) 03/25/2023   HTN (hypertension) 01/17/2023   HLD (hyperlipidemia) 01/17/2023   Asthma 01/17/2023   Unstable angina (HCC) 01/17/2023   Polyp of transverse colon    Total knee replacement status 10/18/2020   Primary osteoarthritis of right hip 08/06/2020   Sarcoidosis of lung (HCC) 05/29/2020   Aneurysm of celiac artery (HCC) 02/11/2020   Localized swelling of right lower leg 09/06/2019   OSA on CPAP 09/06/2019   Secondary hyperparathyroidism of renal origin (HCC) 07/21/2019   Anemia in chronic kidney disease 06/14/2019   Benign hypertensive kidney disease with chronic kidney disease 06/14/2019   Sickle cell trait (HCC) 06/14/2019   Stage 3b chronic kidney disease (HCC) 06/14/2019   Chronic bronchitis (HCC) 05/19/2019   Chronic venous insufficiency 04/15/2019   Lymphedema 04/15/2019   Family history of colon cancer    Trigeminal  neuralgia 09/11/2018   Low back pain without sciatica 02/21/2017   Renal cyst 10/18/2016   Atherosclerosis of aorta (HCC) 10/16/2016   History of colonic polyps 09/25/2016   Dyslipidemia 12/08/2015   Gastroesophageal reflux disease without esophagitis 08/11/2015   Primary osteoarthritis of knee 06/13/2015   Hip bursitis 06/13/2015   RLS (restless legs syndrome) 06/13/2015   Depression, major, in remission (HCC) 06/13/2015   B12 deficiency 06/13/2015   History of Roux-en-Y gastric bypass 06/13/2015   History of iron deficiency  anemia 06/13/2015   Vitamin D  deficiency 06/13/2015   Hypertension, benign 06/13/2015   Perennial allergic rhinitis with seasonal variation 06/13/2015   Primary osteoarthritis of both knees 06/13/2015   Cervical radiculitis 01/04/2014   Cervical spinal stenosis 01/04/2014   Cervical osteoarthritis 01/04/2014    Past Surgical History:  Procedure Laterality Date   BARIATRIC SURGERY     CESAREAN SECTION     3 or more   COLONOSCOPY WITH PROPOFOL  N/A 03/15/2019   Procedure: COLONOSCOPY WITH PROPOFOL ;  Surgeon: Unk Corinn Skiff, MD;  Location: [facility] ENDOSCOPY;  Service: Gastroenterology;  Laterality: N/A;   COLONOSCOPY WITH PROPOFOL  N/A 03/15/2022   Procedure: COLONOSCOPY WITH PROPOFOL ;  Surgeon: Unk Corinn Skiff, MD;  Location: [facility] ENDOSCOPY;  Service: Gastroenterology;  Laterality: N/A;   GASTRIC BYPASS     KNEE ARTHROPLASTY Right 10/18/2020   Procedure: COMPUTER ASSISTED TOTAL KNEE ARTHROPLASTY;  Surgeon: Mardee Lynwood SQUIBB, MD;  Location: [facility] ORS;  Service: Orthopedics;  Laterality: Right;   LEFT HEART CATH AND CORONARY ANGIOGRAPHY N/A 01/20/2023   Procedure: LEFT HEART CATH AND CORONARY ANGIOGRAPHY;  Surgeon: Darron Deatrice LABOR, MD;  Location: [facility] INVASIVE CV LAB;  Service: Cardiovascular;  Laterality: N/A;   SHOULDER ARTHROSCOPY Right    TUBAL LIGATION      Family History  Problem Relation Age of Onset   Hypercholesterolemia Mother    Heart disease Mother    Hypertension Mother    Alcohol abuse Father    Lung cancer Brother    Alcohol abuse Brother    Diabetes Mellitus II Sister    Hypertension Maternal Grandmother    Colon cancer Sister    Breast cancer Neg Hx    Prostate cancer Neg Hx    Bladder Cancer Neg Hx    Kidney cancer Neg Hx     Social History   Tobacco Use   Smoking status: Never   Smokeless tobacco: Never   Tobacco comments:    Second hand smoke exposure  Substance Use Topics   Alcohol use: Not Currently    Alcohol/week: 0.0 standard drinks of alcohol      Current Outpatient Medications:    acetaminophen  (TYLENOL ) 650 MG CR tablet, Take 1,300 mg by mouth every 8 (eight) hours as needed for pain., Disp: , Rfl:    albuterol  (VENTOLIN  HFA) 108 (90 Base) MCG/ACT inhaler, INHALE 1-2 PUFFS BY MOUTH EVERY 6 HOURS AS NEEDED FOR WHEEZE OR SHORTNESS OF BREATH, Disp: 18 each, Rfl: 0   Ascorbic Acid  (VITAMIN C ) 1000 MG tablet, Take 1,000 mg by mouth daily., Disp: , Rfl:    aspirin  EC 81 MG tablet, Take 1 tablet by mouth daily., Disp: , Rfl:    azelastine  (ASTELIN ) 0.1 % nasal spray, Place 2 sprays into both nostrils 2 (two) times daily. Use in each nostril as directed, Disp: 30 mL, Rfl: 12   calcium  carbonate (TUMS - DOSED IN MG ELEMENTAL CALCIUM ) 500 MG chewable tablet, Chew 500-1,000 mg by mouth daily as needed for indigestion  or heartburn., Disp: , Rfl:    Cholecalciferol (D 1000) 25 MCG (1000 UT) capsule, Take 1,000 Units by mouth daily., Disp: , Rfl:    ezetimibe  (ZETIA ) 10 MG tablet, TAKE 1 TABLET BY MOUTH EVERY DAY, Disp: 90 tablet, Rfl: 1   fluticasone  (FLONASE ) 50 MCG/ACT nasal spray, SPRAY 2 SPRAYS INTO EACH NOSTRIL EVERY DAY, Disp: 48 mL, Rfl: 1   gabapentin  (NEURONTIN ) 100 MG capsule, Take 100-200 mg by mouth 2 (two) times daily. One in am and two at night, Disp: , Rfl:    JARDIANCE 10 MG TABS tablet, Take 10 mg by mouth daily., Disp: , Rfl:    levocetirizine (XYZAL  ALLERGY 24HR) 5 MG tablet, Take 1 tablet (5 mg total) by mouth every evening., Disp: 30 tablet, Rfl: 6   magnesium  oxide (MAG-OX) 400 MG tablet, Take 400 mg by mouth daily., Disp: , Rfl:    Multiple Vitamins-Minerals (CENTRUM SILVER ) tablet, Take 1 tablet by mouth daily., Disp: 30 tablet, Rfl: 0   nortriptyline  (PAMELOR ) 10 MG capsule, Take 10-20 mg by mouth at bedtime., Disp: , Rfl:    Probiotic Product (FORTIFY PROBIOTIC WOMENS EX ST PO), Take by mouth daily., Disp: , Rfl:    propranolol  (INDERAL ) 10 MG tablet, Take 1 tablet (10 mg total) by mouth 3 (three) times daily as needed.  For sustained fast heart rate, Disp: 60 tablet, Rfl: 3   zinc  gluconate 50 MG tablet, Take 50 mg by mouth daily., Disp: , Rfl:    atorvastatin  (LIPITOR) 40 MG tablet, Take 1 tablet (40 mg total) by mouth daily., Disp: 90 tablet, Rfl: 1   Fluticasone -Umeclidin-Vilant (TRELEGY ELLIPTA ) 200-62.5-25 MCG/ACT AEPB, INHALE ONE ACTUATION INTO THE LUNGS DAILY. Please schedule office visit before any future refills. (Patient not taking: Reported on 08/27/2023), Disp: 60 each, Rfl: 0  Allergies  Allergen Reactions   Shellfish Allergy     Edema    I personally reviewed active problem list, medication list, allergies, family history with the patient/caregiver today.   ROS  Ten systems reviewed and is negative except as mentioned in HPI    Objective  Vitals:   08/27/23 1010  BP: 128/76  Pulse: 96  Resp: 16  Temp: 97.7 F (36.5 C)  TempSrc: Oral  Weight: 201 lb 8 oz (91.4 kg)  Height: 6' (1.829 m)    Body mass index is 27.33 kg/m.  Physical Exam  Constitutional: Patient appears well-developed and well-nourished. Obese  No distress.  HEENT: head atraumatic, normocephalic, pupils equal and reactive to light, ears normal TM, neck supple Cardiovascular: Normal rate, regular rhythm and normal heart sounds.  No murmur heard. No BLE edema. Pulmonary/Chest: Effort normal and breath sounds normal. No respiratory distress. Abdominal: Soft.  There is no tenderness. Psychiatric: Patient has a normal mood and affect. behavior is normal. Judgment and thought content normal.   Diabetic Foot Exam:     PHQ2/9:    08/27/2023   10:09 AM 04/28/2023    3:16 PM 03/25/2023    9:49 AM 03/14/2023    1:38 PM 11/19/2022   11:01 AM  Depression screen PHQ 2/9  Decreased Interest 0 0 0 0 0  Down, Depressed, Hopeless 0 0 0 0 0  PHQ - 2 Score 0 0 0 0 0  Altered sleeping 0 0 0 0 0  Tired, decreased energy 0 1 0 0 1  Change in appetite 0 0 0 0 0  Feeling bad or failure about yourself  0 0 0 0 0  Trouble  concentrating 0 1 0 0 0  Moving slowly or fidgety/restless 0 0 0 0 0  Suicidal thoughts 0 0 0 0 0  PHQ-9 Score 0 2 0 0 1  Difficult doing work/chores Not difficult at all Not difficult at all       phq 9 is negative   Fall Risk:    08/27/2023   10:06 AM 04/14/2023   10:11 AM 03/25/2023    9:48 AM 03/14/2023    1:38 PM 11/19/2022   11:00 AM  Fall Risk   Falls in the past year? 0 0 0 0 0  Number falls in past yr: 0 0  0 0  Injury with Fall? 0 0  0 0  Risk for fall due to : No Fall Risks No Fall Risks No Fall Risks No Fall Risks No Fall Risks  Follow up Falls prevention discussed;Education provided;Falls evaluation completed Falls evaluation completed;Education provided;Falls prevention discussed Falls prevention discussed Falls prevention discussed Falls prevention discussed     Assessment & Plan  1. Interstitial lung disease due to granulomatous disease (HCC) (Primary)  Up to date with visits with pulmonologist   2. Depression, major, in remission [facility])  Doing well, keep follow up with therapist   3. Aneurysm of celiac artery (HCC)  - atorvastatin  (LIPITOR) 40 MG tablet; Take 1 tablet (40 mg total) by mouth daily.  Dispense: 90 tablet; Refill: 1  4. Secondary hyperparathyroidism of renal origin (HCC)  - DG Bone Density; Future  5. Atherosclerosis of aorta (HCC)  - atorvastatin  (LIPITOR) 40 MG tablet; Take 1 tablet (40 mg total) by mouth daily.  Dispense: 90 tablet; Refill: 1  6. Chronic kidney disease, stage 3b (HCC)  - DG Bone Density; Future  7. Chronic asthmatic bronchitis (HCC)  Stable at this time  8. Unstable angina (HCC)  No problems since visit to [facility]  9. Sickle cell trait (HCC)  Unchanged  10. OSA on CPAP  Compliant  11. Breast cancer screening by mammogram  - MM 3D SCREENING MAMMOGRAM BILATERAL BREAST; Future  12. Ovarian failure  - DG Bone Density; Future

## 2023-08-28 ENCOUNTER — Encounter: Payer: Medicare Other | Admitting: *Deleted

## 2023-08-28 DIAGNOSIS — D860 Sarcoidosis of lung: Secondary | ICD-10-CM | POA: Diagnosis not present

## 2023-08-28 NOTE — Progress Notes (Signed)
 Daily Session Note  Patient Details  Name: Melissa [name] MRN: [id number] Date of Birth: 01/10/1951 Referring Provider:   Flowsheet Row Pulmonary Rehab from 04/28/2023 in [facility]  Referring Provider Dr. Isaiah       Encounter Date: 08/28/2023  Check In:  Session Check In - 08/28/23 1041       Check-In   Supervising physician immediately available to respond to emergencies See telemetry face sheet for immediately available ER MD    Location [facility]-Cardiac & Pulmonary Rehab    Staff Present Hoy Rodney, RN BSN;Joseph Rolinda, RCP,RRT,BSRT;Maxon Exline BS, , Exercise Physiologist    Virtual Visit No    Medication changes reported     No    Fall or balance concerns reported    No    Warm-up and Cool-down Performed on first and last piece of equipment    Resistance Training Performed Yes    VAD Patient? No    PAD/SET Patient? No      Pain Assessment   Currently in Pain? No/denies                Social History   Tobacco Use  Smoking Status Never  Smokeless Tobacco Never  Tobacco Comments   Second hand smoke exposure    Goals Met:  Independence with exercise equipment Exercise tolerated well No report of concerns or symptoms today Strength training completed today  Goals Unmet:  Not Applicable  Comments: Pt able to follow exercise prescription today without complaint.  Will continue to monitor for progression.    Dr. Oneil Pinal is Medical Director for [facility] Cardiac Rehabilitation.  Dr. Fuad Aleskerov is Medical Director for [facility] Pulmonary Rehabilitation.

## 2023-08-29 ENCOUNTER — Encounter (INDEPENDENT_AMBULATORY_CARE_PROVIDER_SITE_OTHER): Payer: Medicare Other | Admitting: Vascular Surgery

## 2023-09-02 ENCOUNTER — Encounter: Payer: Medicare Other | Admitting: *Deleted

## 2023-09-02 DIAGNOSIS — D860 Sarcoidosis of lung: Secondary | ICD-10-CM

## 2023-09-02 NOTE — Progress Notes (Signed)
 Daily Session Note  Patient Details  Name: Melissa [name] MRN: [id number] Date of Birth: 01-01-1951 Referring Provider:   Flowsheet Row Pulmonary Rehab from 04/28/2023 in [facility] Cardiac and Pulmonary Rehab  Referring Provider Dr. Isaiah       Encounter Date: 09/02/2023  Check In:  Session Check In - 09/02/23 1109       Check-In   Supervising physician immediately available to respond to emergencies See telemetry face sheet for immediately available ER MD    Location [facility]-Cardiac & Pulmonary Rehab    Staff Present Othel Durand, RN, BSN, CCRP;Meredith Tressa, RN BSN;Krista Jacques RN, BSN;Maxon Conetta BS, , Exercise Physiologist    Virtual Visit No    Medication changes reported     No    Fall or balance concerns reported    No    Warm-up and Cool-down Performed on first and last piece of equipment    Resistance Training Performed Yes    VAD Patient? No    PAD/SET Patient? No      Pain Assessment   Currently in Pain? No/denies                Social History   Tobacco Use  Smoking Status Never  Smokeless Tobacco Never  Tobacco Comments   Second hand smoke exposure    Goals Met:  Proper associated with RPD/PD & O2 Sat Independence with exercise equipment Exercise tolerated well No report of concerns or symptoms today  Goals Unmet:  Not Applicable  Comments: Pt able to follow exercise prescription today without complaint.  Will continue to monitor for progression.    Dr. Oneil Pinal is Medical Director for [facility] Cardiac Rehabilitation.  Dr. Fuad Aleskerov is Medical Director for [facility] Pulmonary Rehabilitation.

## 2023-09-03 ENCOUNTER — Ambulatory Visit (HOSPITAL_BASED_OUTPATIENT_CLINIC_OR_DEPARTMENT_OTHER): Payer: Medicare Other | Attending: Internal Medicine | Admitting: Radiology

## 2023-09-03 ENCOUNTER — Encounter: Payer: Self-pay | Admitting: *Deleted

## 2023-09-03 DIAGNOSIS — D860 Sarcoidosis of lung: Secondary | ICD-10-CM

## 2023-09-03 DIAGNOSIS — G4733 Obstructive sleep apnea (adult) (pediatric): Secondary | ICD-10-CM

## 2023-09-03 NOTE — Progress Notes (Signed)
 Pulmonary Individual Treatment Plan  Patient Details  Name: TONI SCHNIPKE MRN: [id number] Date of Birth: 11-Mar-1951 Referring Provider:   Flowsheet Row Pulmonary Rehab from 04/28/2023 in [facility] Cardiac and Pulmonary Rehab  Referring Provider Dr. Auston Left       Initial Encounter Date:  Flowsheet Row Pulmonary Rehab from 04/28/2023 in [facility] Cardiac and Pulmonary Rehab  Date 04/28/23       Visit Diagnosis: Sarcoidosis of lung (HCC)  Patient's Home Medications on Admission:  Current Outpatient Medications:    acetaminophen  (TYLENOL ) 650 MG CR tablet, Take 1,300 mg by mouth every 8 (eight) hours as needed for pain., Disp: , Rfl:    albuterol  (VENTOLIN  HFA) 108 (90 Base) MCG/ACT inhaler, INHALE 1-2 PUFFS BY MOUTH EVERY 6 HOURS AS NEEDED FOR WHEEZE OR SHORTNESS OF BREATH, Disp: 18 each, Rfl: 0   Ascorbic Acid  (VITAMIN C ) 1000 MG tablet, Take 1,000 mg by mouth daily., Disp: , Rfl:    aspirin  EC 81 MG tablet, Take 1 tablet by mouth daily., Disp: , Rfl:    atorvastatin  (LIPITOR) 40 MG tablet, Take 1 tablet (40 mg total) by mouth daily., Disp: 90 tablet, Rfl: 1   azelastine  (ASTELIN ) 0.1 % nasal spray, Place 2 sprays into both nostrils 2 (two) times daily. Use in each nostril as directed, Disp: 30 mL, Rfl: 12   calcium  carbonate (TUMS - DOSED IN MG ELEMENTAL CALCIUM ) 500 MG chewable tablet, Chew 500-1,000 mg by mouth daily as needed for indigestion or heartburn., Disp: , Rfl:    Cholecalciferol (D 1000) 25 MCG (1000 UT) capsule, Take 1,000 Units by mouth daily., Disp: , Rfl:    ezetimibe  (ZETIA ) 10 MG tablet, TAKE 1 TABLET BY MOUTH EVERY DAY, Disp: 90 tablet, Rfl: 1   fluticasone  (FLONASE ) 50 MCG/ACT nasal spray, SPRAY 2 SPRAYS INTO EACH NOSTRIL EVERY DAY, Disp: 48 mL, Rfl: 1   Fluticasone -Umeclidin-Vilant (TRELEGY ELLIPTA ) 200-62.5-25 MCG/ACT AEPB, INHALE ONE ACTUATION INTO THE LUNGS DAILY. Please schedule office visit before any future refills. (Patient not taking: Reported on 08/27/2023), Disp: 60  each, Rfl: 0   gabapentin  (NEURONTIN ) 100 MG capsule, Take 100-200 mg by mouth 2 (two) times daily. One in am and two at night, Disp: , Rfl:    JARDIANCE 10 MG TABS tablet, Take 10 mg by mouth daily., Disp: , Rfl:    levocetirizine (XYZAL  ALLERGY 24HR) 5 MG tablet, Take 1 tablet (5 mg total) by mouth every evening., Disp: 30 tablet, Rfl: 6   magnesium  oxide (MAG-OX) 400 MG tablet, Take 400 mg by mouth daily., Disp: , Rfl:    Multiple Vitamins-Minerals (CENTRUM SILVER ) tablet, Take 1 tablet by mouth daily., Disp: 30 tablet, Rfl: 0   nortriptyline  (PAMELOR ) 10 MG capsule, Take 10-20 mg by mouth at bedtime., Disp: , Rfl:    Probiotic Product (FORTIFY PROBIOTIC WOMENS EX ST PO), Take by mouth daily., Disp: , Rfl:    propranolol  (INDERAL ) 10 MG tablet, Take 1 tablet (10 mg total) by mouth 3 (three) times daily as needed. For sustained fast heart rate, Disp: 60 tablet, Rfl: 3   zinc  gluconate 50 MG tablet, Take 50 mg by mouth daily., Disp: , Rfl:   Past Medical History: Past Medical History:  Diagnosis Date   Aortic atherosclerosis (HCC)    Asthma    Bruising    Cardiomyopathy (HCC)    Celiac artery aneurysm (HCC)    Cervical radiculopathy    CKD (chronic kidney disease), stage III (HCC)    Deficiency of vitamin B  Depression    Edema    Flank pain    Gross hematuria    History of 2019 novel coronavirus disease (COVID-19) 05/01/2019   History of bariatric surgery    HLD (hyperlipidemia)    HTN (hypertension)    Interstitial lung disease (HCC)    Iron deficiency anemia    Leukopenia    OA (osteoarthritis)    Obesity    OSA on CPAP    Osteopenia    Perennial allergic rhinitis    Pneumonia due to COVID-19 virus 2020   Renal cysts, acquired, bilateral    RLS (restless legs syndrome)    Sarcoidosis    Sickle cell trait (HCC)    Snoring    Spinal stenosis    Trigeminal neuralgia     Tobacco Use: Social History   Tobacco Use  Smoking Status Never  Smokeless Tobacco Never   Tobacco Comments   Second hand smoke exposure    Labs: Review Flowsheet  More data exists      Latest Ref Rng & Units 10/18/2020 04/10/2021 04/10/2022 01/17/2023 01/18/2023  Labs for ITP Cardiac and Pulmonary Rehab  Cholestrol 0 - 200 mg/dL - 161  096  - 045   LDL (calc) 0 - 99 mg/dL - 94  57  - 54   HDL-C >40 mg/dL - 92  82  - 72   Trlycerides <150 mg/dL - 72  50  - 42   Hemoglobin A1c 4.8 - 5.6 % - - - 5.9  -  TCO2 22 - 32 mmol/L 25  - - - -     Pulmonary Assessment Scores:  Pulmonary Assessment Scores     Row Name 04/28/23 1515         ADL UCSD   ADL Phase Entry     SOB Score total 14     Rest 0     Walk 0     Stairs 2     Bath 0     Dress 1     Shop 2       CAT Score   CAT Score 6       mMRC Score   mMRC Score 1              UCSD: Self-administered rating of dyspnea associated with activities of daily living (ADLs) 6-point scale (0 = "not at all" to 5 = "maximal or unable to do because of breathlessness")  Scoring Scores range from 0 to 120.  Minimally important difference is 5 units  CAT: CAT can identify the health impairment of COPD patients and is better correlated with disease progression.  CAT has a scoring range of zero to 40. The CAT score is classified into four groups of low (less than 10), medium (10 - 20), high (21-30) and very high (31-40) based on the impact level of disease on health status. A CAT score over 10 suggests significant symptoms.  A worsening CAT score could be explained by an exacerbation, poor medication adherence, poor inhaler technique, or progression of COPD or comorbid conditions.  CAT MCID is 2 points  mMRC: mMRC (Modified Medical Research Council) Dyspnea Scale is used to assess the degree of baseline functional disability in patients of respiratory disease due to dyspnea. No minimal important difference is established. A decrease in score of 1 point or greater is considered a positive change.   Pulmonary Function  Assessment:  Pulmonary Function Assessment - 04/14/23 1018       Breath  Shortness of Breath No             Exercise Target Goals: Exercise Program Goal: Individual exercise prescription set using results from initial 6 min walk test and THRR while considering  patient's activity barriers and safety.   Exercise Prescription Goal: Initial exercise prescription builds to 30-45 minutes a day of aerobic activity, 2-3 days per week.  Home exercise guidelines will be given to patient during program as part of exercise prescription that the participant will acknowledge.  Education: Aerobic Exercise: - Group verbal and visual presentation on the components of exercise prescription. Introduces F.I.T.T principle from ACSM for exercise prescriptions.  Reviews F.I.T.T. principles of aerobic exercise including progression. Written material given at graduation. Flowsheet Row Pulmonary Rehab from 07/03/2023 in [facility] Cardiac and Pulmonary Rehab  Education need identified 04/28/23  Date 06/19/23  Educator MB  Instruction Review Code 1- [employer] Understanding       Education: Resistance Exercise: - Group verbal and visual presentation on the components of exercise prescription. Introduces F.I.T.T principle from ACSM for exercise prescriptions  Reviews F.I.T.T. principles of resistance exercise including progression. Written material given at graduation. Flowsheet Row Pulmonary Rehab from 07/03/2023 in [facility] Cardiac and Pulmonary Rehab  Date 06/12/23  Educator MB  Instruction Review Code 1- [employer] Understanding        Education: Exercise & Equipment Safety: - Individual verbal instruction and demonstration of equipment use and safety with use of the equipment. Flowsheet Row Pulmonary Rehab from 07/03/2023 in [facility] Cardiac and Pulmonary Rehab  Date 04/28/23  Educator [facility]  Instruction Review Code 1- Verbalizes Understanding       Education: Exercise Physiology & General Exercise  Guidelines: - Group verbal and written instruction with models to review the exercise physiology of the cardiovascular system and associated critical values. Provides general exercise guidelines with specific guidelines to those with heart or lung disease.  Flowsheet Row Pulmonary Rehab from 07/03/2023 in [facility] Cardiac and Pulmonary Rehab  Education need identified 04/28/23  Date 06/05/23  Educator MB  Instruction Review Code 1- [employer] Understanding       Education: Flexibility, Balance, Mind/Body Relaxation: - Group verbal and visual presentation with interactive activity on the components of exercise prescription. Introduces F.I.T.T principle from ACSM for exercise prescriptions. Reviews F.I.T.T. principles of flexibility and balance exercise training including progression. Also discusses the mind body connection.  Reviews various relaxation techniques to help reduce and manage stress (i.e. Deep breathing, progressive muscle relaxation, and visualization). Balance handout provided to take home. Written material given at graduation. Flowsheet Row Pulmonary Rehab from 07/03/2023 in [facility] Cardiac and Pulmonary Rehab  Date 06/12/23  Educator MB  Instruction Review Code 1- Verbalizes Understanding       Activity Barriers & Risk Stratification:  Activity Barriers & Cardiac Risk Stratification - 04/28/23 1502       Activity Barriers & Cardiac Risk Stratification   Activity Barriers Right Knee Replacement;Back Problems;Other (comment)    Comments left knee stiffness and some hip pain             6 Minute Walk:  6 Minute Walk     Row Name 04/28/23 1459         6 Minute Walk   Phase Initial     Distance 1290 feet     Walk Time 6 minutes     # of Rest Breaks 0     MPH 2.44     METS 3.45     RPE 13  Perceived Dyspnea  0     VO2 Peak 12.06     Symptoms No     Resting HR 93 bpm     Resting BP 122/72     Resting Oxygen  Saturation  95 %     Exercise Oxygen  Saturation   during 6 min walk 93 %     Max Ex. HR 130 bpm     Max Ex. BP 158/80     2 Minute Post BP 118/62       Interval HR   1 Minute HR 114     2 Minute HR 117     3 Minute HR 118     4 Minute HR 120     5 Minute HR 130     6 Minute HR 127     2 Minute Post HR 101     Interval Heart Rate? Yes       Interval Oxygen    Interval Oxygen ? Yes     Baseline Oxygen  Saturation % 95 %     1 Minute Oxygen  Saturation % 98 %     1 Minute Liters of Oxygen  0 L     2 Minute Oxygen  Saturation % 93 %     2 Minute Liters of Oxygen  0 L     3 Minute Oxygen  Saturation % 94 %     3 Minute Liters of Oxygen  0 L     4 Minute Oxygen  Saturation % 95 %     4 Minute Liters of Oxygen  0 L     5 Minute Oxygen  Saturation % 93 %     5 Minute Liters of Oxygen  0 L     6 Minute Oxygen  Saturation % 95 %     6 Minute Liters of Oxygen  0 L     2 Minute Post Oxygen  Saturation % 94 %     2 Minute Post Liters of Oxygen  0 L             Oxygen  Initial Assessment:  Oxygen  Initial Assessment - 04/28/23 1514       Home Oxygen    Home Oxygen  Device None    Sleep Oxygen  Prescription CPAP    Liters per minute 0    Home Exercise Oxygen  Prescription None    Home Resting Oxygen  Prescription None    Compliance with Home Oxygen  Use Yes      Initial 6 min Walk   Oxygen  Used None      Program Oxygen  Prescription   Program Oxygen  Prescription None      Intervention   Short Term Goals To learn and exhibit compliance with exercise, home and travel O2 prescription;To learn and understand importance of monitoring SPO2 with pulse oximeter and demonstrate accurate use of the pulse oximeter.;To learn and understand importance of maintaining oxygen  saturations>88%;To learn and demonstrate proper pursed lip breathing techniques or other breathing techniques. ;To learn and demonstrate proper use of respiratory medications    Long  Term Goals Exhibits compliance with exercise, home  and travel O2 prescription;Maintenance of O2  saturations>88%;Compliance with respiratory medication;Demonstrates proper use of MDI's;Exhibits proper breathing techniques, such as pursed lip breathing or other method taught during program session;Verbalizes importance of monitoring SPO2 with pulse oximeter and return demonstration             Oxygen  Re-Evaluation:  Oxygen  Re-Evaluation     Row Name 06/05/23 1122 06/24/23 1115 09/02/23 1135         Program Oxygen  Prescription  Program Oxygen  Prescription None None None       Home Oxygen    Home Oxygen  Device None None None     Sleep Oxygen  Prescription CPAP CPAP CPAP     Liters per minute 0 -- 0     Home Exercise Oxygen  Prescription -- None None     Home Resting Oxygen  Prescription None None None     Compliance with Home Oxygen  Use Yes Yes Yes       Goals/Expected Outcomes   Short Term Goals To learn and demonstrate proper pursed lip breathing techniques or other breathing techniques.  To learn and demonstrate proper pursed lip breathing techniques or other breathing techniques. ;To learn and demonstrate proper use of respiratory medications;To learn and understand importance of maintaining oxygen  saturations>88% To learn and demonstrate proper pursed lip breathing techniques or other breathing techniques. ;To learn and demonstrate proper use of respiratory medications;To learn and understand importance of maintaining oxygen  saturations>88%     Long  Term Goals Exhibits proper breathing techniques, such as pursed lip breathing or other method taught during program session Exhibits proper breathing techniques, such as pursed lip breathing or other method taught during program session;Compliance with respiratory medication;Maintenance of O2 saturations>88% Exhibits proper breathing techniques, such as pursed lip breathing or other method taught during program session;Maintenance of O2 saturations>88%     Comments Informed patient how to perform the Pursed Lipped breathing technique.  Told patient to Inhale through the nose and out the mouth with pursed lips to keep their airways open, help oxygenate them better, practice when at rest or doing strenuous activity. Patient Verbalizes understanding of technique and will work on and be reiterated during [facility]. Deatra Face reports doing well in the program. She has noticed an improvement in her shortness of breath and wants to continue to work on her breathing techniques while enrolled in the program. She is compliant with her CPAP use. She notes that some smells will exacerbate her asthma, like when she was cleaning her oven, so she is reminded to take care with those smells. Deatra Face reports her shortness of breath has improved since beginning the program. She was able to walk comfortably without significant shortness of breath during her cruise trip and is looking foward to her next cruise since she is feeling even stronger. She is still compliant with her  CPAP use. She has continued ot be mindful of the chemicals she is around to prevent asthma attacks     Goals/Expected Outcomes Short: use PLB with exertion. Long: use PLB on exertion proficiently and independently. Short: practice PLB and awareness on chemical smells. Long: independently manage breathing techniques Short: attend the program to continue working on her breathing. Long: manage her breathing and CPAP management independenlty.              Oxygen  Discharge (Final Oxygen  Re-Evaluation):  Oxygen  Re-Evaluation - 09/02/23 1135       Program Oxygen  Prescription   Program Oxygen  Prescription None      Home Oxygen    Home Oxygen  Device None    Sleep Oxygen  Prescription CPAP    Liters per minute 0    Home Exercise Oxygen  Prescription None    Home Resting Oxygen  Prescription None    Compliance with Home Oxygen  Use Yes      Goals/Expected Outcomes   Short Term Goals To learn and demonstrate proper pursed lip breathing techniques or other breathing techniques. ;To learn and  demonstrate proper use of respiratory medications;To learn and understand importance  of maintaining oxygen  saturations>88%    Long  Term Goals Exhibits proper breathing techniques, such as pursed lip breathing or other method taught during program session;Maintenance of O2 saturations>88%    Comments Deatra Face reports her shortness of breath has improved since beginning the program. She was able to walk comfortably without significant shortness of breath during her cruise trip and is looking foward to her next cruise since she is feeling even stronger. She is still compliant with her  CPAP use. She has continued ot be mindful of the chemicals she is around to prevent asthma attacks    Goals/Expected Outcomes Short: attend the program to continue working on her breathing. Long: manage her breathing and CPAP management independenlty.             Initial Exercise Prescription:  Initial Exercise Prescription - 04/28/23 1500       Date of Initial Exercise RX and Referring Provider   Date 04/28/23    Referring Provider Dr. Auston Left      Oxygen    Maintain Oxygen  Saturation 88% or higher      Treadmill   MPH 3    Grade 0    Minutes 15    METs 3.5      NuStep   Level 2   T6   SPM 80    Minutes 15    METs 3.45      Elliptical   Level 1    Speed 2.5    Minutes 15    METs 3.45      REL-XR   Level 2    Speed 50    Minutes 15    METs 3.45      T5 Nustep   Level 2    SPM 80    Minutes 15    METs 3.45      Prescription Details   Frequency (times per week) 3    Duration Progress to 30 minutes of continuous aerobic without signs/symptoms of physical distress      Intensity   THRR 40-80% of Max Heartrate 115-138    Ratings of Perceived Exertion 11-13    Perceived Dyspnea 0-4      Progression   Progression Continue to progress workloads to maintain intensity without signs/symptoms of physical distress.      Resistance Training   Training Prescription Yes    Weight 4    Reps 10-15              Perform Capillary Blood Glucose checks as needed.  Exercise Prescription Changes:   Exercise Prescription Changes     Row Name 04/28/23 1500 05/08/23 1600 05/22/23 1700 06/05/23 1400 06/19/23 1300     Response to Exercise   Blood Pressure (Admit) 122/72 112/60 122/62 134/70 122/62   Blood Pressure (Exercise) 158/80 116/74 170/70 160/70 158/60   Blood Pressure (Exit) 118/62 110/60 102/60 110/70 108/52   Heart Rate (Admit) 93 bpm 112 bpm 78 bpm 82 bpm 94 bpm   Heart Rate (Exercise) 130 bpm 128 bpm 132 bpm 132 bpm 124 bpm   Heart Rate (Exit) 101 bpm 113 bpm 126 bpm 105 bpm 130 bpm   Oxygen  Saturation (Admit) 95 % 95 % 99 % 98 % 92 %   Oxygen  Saturation (Exercise) 93 % 90 % 90 % 81 % 88 %   Oxygen  Saturation (Exit) 94 % 98 % 95 % 97 % 92 %   Rating of Perceived Exertion (Exercise) 13 13 15 15 12    Perceived  Dyspnea (Exercise) 0 0 0 1 3   Symptoms none none none none none   Comments 6 MWT results First week of exercise -- -- --   Duration -- Progress to 30 minutes of  aerobic without signs/symptoms of physical distress Progress to 30 minutes of  aerobic without signs/symptoms of physical distress Progress to 30 minutes of  aerobic without signs/symptoms of physical distress Progress to 30 minutes of  aerobic without signs/symptoms of physical distress   Intensity -- THRR unchanged THRR unchanged THRR unchanged THRR unchanged     Progression   Progression -- Continue to progress workloads to maintain intensity without signs/symptoms of physical distress. Continue to progress workloads to maintain intensity without signs/symptoms of physical distress. Continue to progress workloads to maintain intensity without signs/symptoms of physical distress. Continue to progress workloads to maintain intensity without signs/symptoms of physical distress.   Average METs -- 3.72 3.96 3.8 3.31     Resistance Training   Training Prescription -- Yes Yes Yes Yes   Weight -- 4 4 5  lb 5 lb    Reps -- 10-15 10-15 10-15 10-15     Interval Training   Interval Training -- No No No No     Treadmill   MPH -- 2.8 3 3 3    Grade -- 1.5 0.5 0.5 0.5   Minutes -- 15 15 15 15    METs -- 3.72 3.5 3.5 3.5     Recumbant Bike   Level -- -- -- -- 3   Watts -- -- -- -- 22   Minutes -- -- -- -- 15   METs -- -- -- -- 2.73     NuStep   Level -- 5 5 -- --   Minutes -- 15 15 -- --   METs -- 4.2 4.2 -- --     Elliptical   Level -- -- 1 1  10  min 1   Speed -- -- 2.5 2.5 4.3   Minutes -- -- 15 15 10      REL-XR   Level -- -- 5 5 --   Minutes -- -- 15 15 --   METs -- -- 5.6 7.1 --     T5 Nustep   Level -- 2 2 3 3    Minutes -- 15 15 15 15    METs -- -- -- 3.1 3.1     Oxygen    Maintain Oxygen  Saturation -- 88% or higher 88% or higher 88% or higher 88% or higher    Row Name 07/03/23 0900 07/16/23 1100 07/31/23 1100 07/31/23 1700 08/12/23 1300     Response to Exercise   Blood Pressure (Admit) 122/62 126/64 -- 118/62 118/62   Blood Pressure (Exit) 110/62 110/60 -- 114/60 122/60   Heart Rate (Admit) 103 bpm 117 bpm -- 96 bpm 109 bpm   Heart Rate (Exercise) 152 bpm 140 bpm -- 137 bpm 139 bpm   Heart Rate (Exit) 115 bpm 116 bpm -- 115 bpm 117 bpm   Oxygen  Saturation (Admit) 97 % 90 % -- 98 % 98 %   Oxygen  Saturation (Exercise) 92 % 94 % -- 89 % 88 %   Oxygen  Saturation (Exit) 97 % 97 % -- 98 % 98 %   Rating of Perceived Exertion (Exercise) 15 12 -- 15 13   Perceived Dyspnea (Exercise) 1 1 -- 0 0   Symptoms none none -- none none   Duration Continue with 30 min of aerobic exercise without signs/symptoms of physical distress. Continue with 30 min  of aerobic exercise without signs/symptoms of physical distress. -- Continue with 30 min of aerobic exercise without signs/symptoms of physical distress. Continue with 30 min of aerobic exercise without signs/symptoms of physical distress.   Intensity THRR unchanged THRR unchanged -- THRR unchanged THRR unchanged     Progression   Progression  Continue to progress workloads to maintain intensity without signs/symptoms of physical distress. Continue to progress workloads to maintain intensity without signs/symptoms of physical distress. -- Continue to progress workloads to maintain intensity without signs/symptoms of physical distress. Continue to progress workloads to maintain intensity without signs/symptoms of physical distress.   Average METs 3.48 3.5 -- 3.6 3.61     Resistance Training   Training Prescription Yes Yes -- Yes Yes   Weight 5 lb 5 lb -- 5 lb 5 lb   Reps 10-15 10-15 -- 10-15 10-15     Interval Training   Interval Training No No -- No No     Treadmill   MPH 3 3 -- 3 3   Grade 0.5 0.5 -- 1 1   Minutes 15 15 -- 15 15   METs 3.5 3.5 -- 3.71 3.71     NuStep   Level 3 6 -- -- --   Minutes 15 15 -- -- --   METs 3.1 -- -- -- --     Elliptical   Level 1 -- -- 1 1   Speed 4.4 -- -- 4.4 3.7   Minutes 15 -- -- 15 15     REL-XR   Level 5 -- -- 6 7   Minutes 15 -- -- 15 15     T5 Nustep   Level -- 4 -- -- --   Minutes -- 15 -- -- --   METs -- 3.6 -- -- --     Biostep-RELP   Level 1 -- -- -- --   Minutes 15 -- -- -- --   METs 4 -- -- -- --     Home Exercise Plan   Plans to continue exercise at -- -- [employer] (comment)  [employer], water aerobics, aerobic machines, weight machines and [employer] (comment)  [employer], water aerobics, aerobic machines, weight machines and Public [employer] (comment)  [employer], water aerobics, aerobic machines, weight machines and handweights   Frequency -- -- Add 2 additional days to program exercise sessions. Add 2 additional days to program exercise sessions. Add 2 additional days to program exercise sessions.   Initial Home Exercises Provided -- -- 07/31/23 07/31/23 07/31/23     Oxygen    Maintain Oxygen  Saturation 88% or higher 88% or higher 88% or higher 88% or higher 88% or higher    Row Name 08/28/23 1400             Response to  Exercise   Blood Pressure (Admit) 124/74       Blood Pressure (Exit) 118/64       Heart Rate (Admit) 91 bpm       Heart Rate (Exercise) 138 bpm       Heart Rate (Exit) 121 bpm       Oxygen  Saturation (Admit) 97 %       Oxygen  Saturation (Exercise) 91 %       Oxygen  Saturation (Exit) 96 %       Rating of Perceived Exertion (Exercise) 15       Perceived Dyspnea (Exercise) 0       Symptoms none       Duration Continue  with 30 min of aerobic exercise without signs/symptoms of physical distress.       Intensity THRR unchanged         Progression   Progression Continue to progress workloads to maintain intensity without signs/symptoms of physical distress.       Average METs 4.94         Resistance Training   Training Prescription Yes       Weight 5 lb       Reps 10-15         Interval Training   Interval Training No         Treadmill   MPH 3       Grade 1       Minutes 15       METs 3.71         Elliptical   Level 1       Speed 4       Minutes 15         REL-XR   Level 8       Minutes 15       METs 7.4         Home Exercise Plan   Plans to continue exercise at [employer] (comment)  [employer], water aerobics, aerobic machines, weight machines and handweights       Frequency Add 2 additional days to program exercise sessions.       Initial Home Exercises Provided 07/31/23         Oxygen    Maintain Oxygen  Saturation 88% or higher                Exercise Comments:   Exercise Goals and Review:   Exercise Goals     Row Name 04/28/23 1509             Exercise Goals   Increase Physical Activity Yes       Intervention Provide advice, education, support and counseling about physical activity/exercise needs.;Develop an individualized exercise prescription for aerobic and resistive training based on initial evaluation findings, risk stratification, comorbidities and participant's personal goals.       Expected Outcomes Short Term: Attend rehab on a regular  basis to increase amount of physical activity.;Long Term: Add in home exercise to make exercise part of routine and to increase amount of physical activity.;Long Term: Exercising regularly at least 3-5 days a week.       Increase Strength and Stamina Yes       Intervention Provide advice, education, support and counseling about physical activity/exercise needs.;Develop an individualized exercise prescription for aerobic and resistive training based on initial evaluation findings, risk stratification, comorbidities and participant's personal goals.       Expected Outcomes Short Term: Increase workloads from initial exercise prescription for resistance, speed, and METs.;Short Term: Perform resistance training exercises routinely during rehab and add in resistance training at home;Long Term: Improve cardiorespiratory fitness, muscular endurance and strength as measured by increased METs and functional capacity ( )       Able to understand and use rate of perceived exertion (RPE) scale Yes       Intervention Provide education and explanation on how to use RPE scale       Expected Outcomes Short Term: Able to use RPE daily in rehab to express subjective intensity level;Long Term:  Able to use RPE to guide intensity level when exercising independently       Able to understand and use Dyspnea scale Yes  Intervention Provide education and explanation on how to use Dyspnea scale       Expected Outcomes Short Term: Able to use Dyspnea scale daily in rehab to express subjective sense of shortness of breath during exertion;Long Term: Able to use Dyspnea scale to guide intensity level when exercising independently       Knowledge and understanding of Target Heart Rate Range (THRR) Yes       Intervention Provide education and explanation of THRR including how the numbers were predicted and where they are located for reference       Expected Outcomes Short Term: Able to state/look up THRR;Long Term: Able to use  THRR to govern intensity when exercising independently;Short Term: Able to use daily as guideline for intensity in rehab       Able to check pulse independently Yes       Intervention Provide education and demonstration on how to check pulse in carotid and radial arteries.;Review the importance of being able to check your own pulse for safety during independent exercise       Expected Outcomes Short Term: Able to explain why pulse checking is important during independent exercise;Long Term: Able to check pulse independently and accurately       Understanding of Exercise Prescription Yes       Intervention Provide education, explanation, and written materials on patient's individual exercise prescription       Expected Outcomes Short Term: Able to explain program exercise prescription;Long Term: Able to explain home exercise prescription to exercise independently                Exercise Goals Re-Evaluation :  Exercise Goals Re-Evaluation     Row Name 05/08/23 1647 05/22/23 1712 06/05/23 1443 06/19/23 1343 06/24/23 1134     Exercise Goal Re-Evaluation   Exercise Goals Review Increase Physical Activity;Increase Strength and Stamina;Understanding of Exercise Prescription Increase Physical Activity;Increase Strength and Stamina;Understanding of Exercise Prescription Increase Physical Activity;Increase Strength and Stamina;Understanding of Exercise Prescription Increase Physical Activity;Increase Strength and Stamina;Understanding of Exercise Prescription Increase Physical Activity   Comments Zoraya is off to a good start in the program. She attended 2 sessions during this review, and was able to use the treadmill, T4, and T5 nustep. We will continue to monitor her progress in the program. Deatra Face continues to make improvements at rehab. She has recently increased her level on the XR from 2 to 5. She has also added 0.5% incline to her prescribed on the treadmill. We will continue to monitor her  progress in the program. Deatra Face continues to do well in rehab. She has been able to increase her hand weights from 4 to 5lb, and increase her level on the T5 nustep from level 2 to 3. She has also been able to maintain a consistent workload on the treadmill, and continues to try to use the elliptical for the full 20 minutes. We will continue to monitor her progress in the program. Deatra Face continues to do well in rehab. She has recently been able to increase her speed on the elliptical from 2.5 to 4.3 mph. She has also been able to maintain a consistent intensity on the treadmill and T5 nustep. We will continue to monitor her progress in the program. [facility] been a part of this class for three years and really enjoys the challenge and community. She continues to increase her levels on her machines every few sessions.  Expected Outcomes Short: Continue to follow current exercise prescription. Long: Continue exercise to improve strength and stamina. Short: Continue to follow current exercise prescription. Long: Continue exercise to improve strength and stamina. Short: Continue to work to improve time on elliptical. Long: Continue exercise to improve strength and stamina. Short: Continue to work to improve time on elliptical. Long: Continue exercise to improve strength and stamina. Short: continue to attend water aerobics and the program consistenlty. Long: independently manage exercise consistently.    Row Name 07/03/23 0913 07/16/23 1147 07/31/23 1131 07/31/23 1725 08/12/23 1305     Exercise Goal Re-Evaluation   Exercise Goals Review Increase Physical Activity;Increase Strength and Stamina;Knowledge and understanding of Target Heart Rate Range (THRR) Increase Physical Activity;Increase Strength and Stamina;Knowledge and understanding of Target Heart Rate Range (THRR) Increase Physical Activity;Increase Strength and Stamina;Knowledge and understanding of Target  Heart Rate Range (THRR);Able to understand and use Dyspnea scale;Understanding of Exercise Prescription;Able to check pulse independently;Able to understand and use rate of perceived exertion (RPE) scale Increase Physical Activity;Increase Strength and Stamina;Understanding of Exercise Prescription Increase Physical Activity;Increase Strength and Stamina;Understanding of Exercise Prescription   Comments Deatra Face is doing well in rehab. She has stayed consistent with her treadmill workload at a speed of 3 mph with an incline of 0.5%. She also was able to do the elliptical for 15 minutes after previously only being able to do 10 minutes. She has stayed consistent with her workloads on seated machines as well. We will continue to monitor her progress in the program. Deatra Face continues to do well in rehab. She has been able to increase her level on the T4 nustep from level 3 to 6. She also increased her level on the T5 nustep from level 3 to 4. We will continue to monitor her progress in the program. Reviewed home exercise with pt today from 11:20 to 11:29.  Pt plans to go to the [facility] and attend water aerobics class 3 times a week and use aerobic machines 2 days a week for exercise.  Reviewed THR, pulse, RPE, sign and symptoms, pulse oximetery and when to call 911 or MD.  Also discussed weather considerations and indoor options.  Pt voiced understanding. Deatra Face is doing well in rehab. She was able to increase her level on the XR from level 5 to 6. She was also able to increase her grade on the treadmill from 0.5% to 1%. We will continue to monitor her progress in the program. Deatra Face continues to do well in rehab. She has stayed consistent with her treadmill workload at a speed of 3 mph with an incline of 1%. She also continues to work at level 1 on the elliptical and improved to level 7 on the XR. We will continue to monitor her progress in the program.   Expected Outcomes Short: Continue to progressively increase treadmill workload.  Long: Continue exercise to improve strength and stamina. Short: Continue to progressively increase treadmill workload. Long: Continue exercise to improve strength and stamina. Short: Continue to go to the [facility] on days away from rehab. Long: Continue to exercise independently. Short: Continue progressively increasing treadmill workloads. Long: Continue exercise to improve strength and stamina. Short: Continue to progressively increase treadmill workload. Long: Continue exercise to improve strength and stamina.    Row Name 08/28/23 1424 09/02/23 1131           Exercise Goal Re-Evaluation   Exercise Goals Review Increase Physical Activity;Increase Strength and Stamina;Understanding of Exercise Prescription Able to understand and use Dyspnea  scale;Knowledge and understanding of Target Heart Rate Range (THRR);Able to check pulse independently;Understanding of Exercise Prescription      Comments Deatra Face is doing well in rehab. She has been able to increase her level on the XR from level 7 to 8. She was also able to maintain her workload on both the treadmill and elliptical. We will continue to monitor her progress in the program. Deatra Face continues to attend her water aerobics class regularly. She already has a plan in [facility]. She has been increasing her levels on her machines and will continue to build on that after graduation. We discussed her HR and how she has been consistently in the 140s during exercise but then cools down. The importance of cooling down was discussed when she is on her own. She wears her watch that tells her her HR and feels comfortable checking her pulse as well.      Expected Outcomes Short: Continue to progressively increase treadmill and elliptical workload. Long: Continue exercise to improve strength and stamina. Short: continue coming to the program until graduation. Long; independently manage exercise routine.               Discharge  Exercise Prescription (Final Exercise Prescription Changes):  Exercise Prescription Changes - 08/28/23 1400       Response to Exercise   Blood Pressure (Admit) 124/74    Blood Pressure (Exit) 118/64    Heart Rate (Admit) 91 bpm    Heart Rate (Exercise) 138 bpm    Heart Rate (Exit) 121 bpm    Oxygen  Saturation (Admit) 97 %    Oxygen  Saturation (Exercise) 91 %    Oxygen  Saturation (Exit) 96 %    Rating of Perceived Exertion (Exercise) 15    Perceived Dyspnea (Exercise) 0    Symptoms none    Duration Continue with 30 min of aerobic exercise without signs/symptoms of physical distress.    Intensity THRR unchanged      Progression   Progression Continue to progress workloads to maintain intensity without signs/symptoms of physical distress.    Average METs 4.94      Resistance Training   Training Prescription Yes    Weight 5 lb    Reps 10-15      Interval Training   Interval Training No      Treadmill   MPH 3    Grade 1    Minutes 15    METs 3.71      Elliptical   Level 1    Speed 4    Minutes 15      REL-XR   Level 8    Minutes 15    METs 7.4      Home Exercise Plan   Plans to continue exercise at [employer] (comment)   [employer], water aerobics, aerobic machines, weight machines and handweights   Frequency Add 2 additional days to program exercise sessions.    Initial Home Exercises Provided 07/31/23      Oxygen    Maintain Oxygen  Saturation 88% or higher             Nutrition:  Target Goals: Understanding of nutrition guidelines, daily intake of sodium 1500mg , cholesterol 200mg , calories 30% from fat and 7% or less from saturated fats, daily to have 5 or more servings of fruits and vegetables.  Education: All About Nutrition: -Group instruction provided by verbal, written material, interactive activities, discussions, models, and posters to present general guidelines  for heart healthy nutrition including fat, fiber, MyPlate, the role of sodium in  heart healthy nutrition, utilization of the nutrition label, and utilization of this knowledge for meal planning. Follow up email sent as well. Written material given at graduation. Flowsheet Row Pulmonary Rehab from 07/03/2023 in [facility] Cardiac and Pulmonary Rehab  Education need identified 04/28/23  Date 07/03/23  Educator JG  Instruction Review Code 1- Verbalizes Understanding       Biometrics:  Pre Biometrics - 04/28/23 1511       Pre Biometrics   Height 6' (1.829 m)    Weight 210 lb (95.3 kg)    Waist Circumference 36 inches    Hip Circumference 47 inches    Waist to Hip Ratio 0.77 %    BMI (Calculated) 28.47    Single Leg Stand 9.43 seconds              Nutrition Therapy Plan and Nutrition Goals:  Nutrition Therapy & Goals - 04/28/23 1511       Nutrition Therapy   Diet Cardiac, Low Na    Protein (specify units) 90    Fiber 25 grams    Whole Grain Foods 3 servings    Saturated Fats 15 max. grams    Fruits and Vegetables 5 servings/day    Sodium 2 grams      Personal Nutrition Goals   Nutrition Goal Eat a small but nutrient dense breakfast most days the week    Personal Goal #2 Eat a protein and carb at each meal    Personal Goal #3 Cut back on sweets, look for healthier snacks    Comments Patient drinking water and coffee only. She reports she often skips lunch due to eating breakfast late, around 11am. She reports she wakes up early and goes to water aerobics without eating breakfast. Brainstormed things to make breakfast more achievable for her, focus was on smaller portions, quick to make, grab and go friendly. Educated on what makes a good meal and snack. Set goal to pair protein and carbs together at each meal. She finds herself snacking on sweets often, talked about better snacks to try more often, reducing the frequency of these sugary foods. Reviewed mediterranean diet handout, types of fats, sources, how to read label. Built out several meals and snacks with  foods she likes and will eat. Focus on smaller portions, healthy fats and reducing sugar intake from poor food choices. She is very optimistic about making changes to her eating structure and will work on consistency.      Intervention Plan   Intervention Prescribe, educate and counsel regarding individualized specific dietary modifications aiming towards targeted core components such as weight, hypertension, lipid management, diabetes, heart failure and other comorbidities.;Nutrition handout(s) given to patient.    Expected Outcomes Long Term Goal: Adherence to prescribed nutrition plan.;Short Term Goal: A plan has been developed with personal nutrition goals set during dietitian appointment.;Short Term Goal: Understand basic principles of dietary content, such as calories, fat, sodium, cholesterol and nutrients.             Nutrition Assessments:  MEDIFICTS Score Key: >=70 Need to make dietary changes  40-70 Heart Healthy Diet <= 40 Therapeutic Level Cholesterol Diet  Flowsheet Row Pulmonary Rehab from 04/28/2023 in [facility] Cardiac and Pulmonary Rehab  Picture Your Plate Total Score on Admission 48      Picture Your Plate Scores: <16 Unhealthy dietary pattern with much room for improvement. 41-50 Dietary pattern unlikely to meet recommendations  for good health and room for improvement. 51-60 More healthful dietary pattern, with some room for improvement.  >60 Healthy dietary pattern, although there may be some specific behaviors that could be improved.   Nutrition Goals Re-Evaluation:  Nutrition Goals Re-Evaluation     Row Name 06/05/23 1124 06/24/23 1131 09/02/23 1128         Goals   Current Weight 256 lb (116.1 kg) -- --     Comment Patient was informed on why it is important to maintain a balanced diet when dealing with Respiratory issues. Explained that it takes a lot of energy to breath and when they are short of breath often they will need to have a good diet to help keep up  with the calories they are expending for breathing. Deatra Face reports that she has been trying to incoporate ideas from her meeting with the RD. She states she has cravings for sugar and carbs but she has just not been buying her typical snacks and she is strengthening her self will to resist over eating. Deatra Face states her appetite is still very good...sometimes too good. However, she is more mindful of what she is eating and her activity levels. She is still not buying her typical sugary snacks regularly. She tries to remind herself to eat something before her water aerobics class by buying easy grab and go items.     Expected Outcome Short: Choose and plan snacks accordingly to patients caloric intake to improve breathing. Long: Maintain a diet independently that meets their caloric intake to aid in daily shortness of breath. Short: continue to incoporate healthy snacks into her day to day planning. Long: independenty manage healthy eating. Short: eat something in the morning consistently. Long; manage healthy eating habits consistently              Nutrition Goals Discharge (Final Nutrition Goals Re-Evaluation):  Nutrition Goals Re-Evaluation - 09/02/23 1128       Goals   Comment Pat states her appetite is still very good...sometimes too good. However, she is more mindful of what she is eating and her activity levels. She is still not buying her typical sugary snacks regularly. She tries to remind herself to eat something before her water aerobics class by buying easy grab and go items.    Expected Outcome Short: eat something in the morning consistently. Long; manage healthy eating habits consistently             Psychosocial: Target Goals: Acknowledge presence or absence of significant depression and/or stress, maximize coping skills, provide positive support system. Participant is able to verbalize types and ability to use techniques and skills needed for reducing stress and depression.    Education: Stress, Anxiety, and Depression - Group verbal and visual presentation to define topics covered.  Reviews how body is impacted by stress, anxiety, and depression.  Also discusses healthy ways to reduce stress and to treat/manage anxiety and depression.  Written material given at graduation. Flowsheet Row Pulmonary Rehab from 07/03/2023 in [facility]iac [facility] Pulmonary Rehab  Date 05/29/23  Educator SB  Instruction Review Code 1- [employer] Understanding       Education: Sleep Hygiene -Provides group verbal and written instruction about how sleep can affect your health.  Define sleep hygiene, discuss sleep cycles and impact of sleep habits. Review good sleep hygiene tips.    Initial Review & Psychosocial Screening:  Initial Psych Review & Screening - 04/14/23 1020       Initial Review  Current issues with None Identified;Current Anxiety/Panic;History of Depression      Family Dynamics   Good Support System? Yes    Comments SHe has a good support system. She can look to her sons for support and her youngest son lives with her. Sometiems she has anxiet over the normal stresses of life but nothing significant.      Barriers   Psychosocial barriers to participate in program The patient should benefit from training in stress management and relaxation.;There are no identifiable barriers or psychosocial needs.      Screening Interventions   Interventions Encouraged to exercise;To provide support and resources with identified psychosocial needs;Provide feedback about the scores to participant    Expected Outcomes Short Term goal: Utilizing psychosocial counselor, staff and physician to assist with identification of specific Stressors or current issues interfering with healing process. Setting desired goal for each stressor or current issue identified.;Long Term Goal: Stressors or current issues are controlled or eliminated.;Short Term goal: Identification and review with participant  of any Quality of Life or Depression concerns found by scoring the questionnaire.;Long Term goal: The participant improves quality of Life and PHQ9 Scores as seen by post scores and/or verbalization of changes             Quality of Life Scores:  Scores of 19 and below usually indicate a poorer quality of life in these areas.  A difference of  2-3 points is a clinically meaningful difference.  A difference of 2-3 points in the total score of the Quality of Life Index has been associated with significant improvement in overall quality of life, self-image, physical symptoms, and general health in studies assessing change in quality of life.  PHQ-9: Review Flowsheet  More data exists      08/27/2023 04/28/2023 03/25/2023 03/14/2023 11/19/2022  Depression screen PHQ 2/9  Decreased Interest 0 0 0 0 0  Down, Depressed, Hopeless 0 0 0 0 0  PHQ - 2 Score 0 0 0 0 0  Altered sleeping 0 0 0 0 0  Tired, decreased energy 0 1 0 0 1  Change in appetite 0 0 0 0 0  Feeling bad or failure about yourself  0 0 0 0 0  Trouble concentrating 0 1 0 0 0  Moving slowly or fidgety/restless 0 0 0 0 0  Suicidal thoughts 0 0 0 0 0  PHQ-9 Score 0 2 0 0 1  Difficult doing work/chores Not difficult at all Not difficult at all - - -   Interpretation of Total Score  Total Score Depression Severity:  1-4 = Minimal depression, 5-9 = Mild depression, 10-14 = Moderate depression, 15-19 = Moderately severe depression, 20-27 = Severe depression   Psychosocial Evaluation and Intervention:  Psychosocial Evaluation - 04/14/23 1022       Psychosocial Evaluation & Interventions   Interventions Encouraged to exercise with the program and follow exercise prescription;Relaxation education;Stress management education    Comments SHe has a good support system. She can look to her sons for support and her youngest son lives with her. Sometiems she has anxiet over the normal stresses of life but nothing significant.    Expected  Outcomes Short: Start [facility] to help with mood. Long: Maintain a healthy mental state.    Continue Psychosocial Services  Follow up required by staff             Psychosocial Re-Evaluation:  Psychosocial Re-Evaluation     Row Name 06/05/23 1126 06/24/23 1123 09/02/23 1123  Psychosocial Re-Evaluation   Current issues with Current Psychotropic Meds;Current Anxiety/Panic;History of Depression Current Psychotropic Meds;Current Anxiety/Panic;History of Depression Current Psychotropic Meds;Current Anxiety/Panic;History of Depression     Comments Deatra Face has some anxiety and takes medication for it. In the last few weeks she has not had any mood changes. She reports no issues with their current mental states, sleep, stress, depression or anxiety. Will follow up with patient in a few weeks for any changes. Deatra Face is meeting with her therapist regularly to help manage her depression and anxiety. She states she has had two family members that have caused an increase in stress and she thinks that they caused a lot of issues related to her blood pressure. Now that they both are no longer involved in her life, she notes that her pressure has been much better and they have taken her off her blood pressure medications. Her therapist has helped her talk through the issues with her sister that is one of the family members that have caused her stress. They were supposed to go on a cruise together in a few weeks, but her sister decided not to go. Deatra Face decided to continue with the cruise plans and this is her first solo time traveling. She is really excited about her trip and her kids are proud of her for planning on going. She continues to attend her water aerobics class that she has been a part of for the last three years and has talked to another patient about joining her. She really enjoys the community of friends and how it makes her feel. Deatra Face wants to continue to take care of her health because she said she  had kids later in life and wants to be healthy when she has grandkids Deatra Face continues to meet with her therapist every two weeks. She has been doing that for years and plans to continue. The stressful situation with her sister has calmed down some. Her cruise went really well and she was very proud of herself for braving it on her own. She has planned another cruise with her neice in a couple weeks. They want her to go ziplining and sing karoke, which is out of character for her she says but she is excited to try. She feels this program has helped her feel more confident and strong to try new things like that. Her family also has a big cruise planned for this summer that she looking forward to. She states she already has a game plan to stay [facility] and [facility] her [facility]ob[place]. She feels at peace with her life currently and plans to keep incorporating positive health habits     Expected Outcomes Short: Continue to exercise regularly to support mental health and notify staff of any changes. Long: maintain mental health and well being through teaching of rehab or prescribed medications independently. Short: continue to attend the program to increase her stamina and positively influence her mental health. Long; maintain positive self care habits. Short: attend the program until graduation to help solidify positive health habits. Long: maintain mental health habits     Interventions Encouraged to attend Pulmonary Rehabilitation for the exercise Encouraged to attend Pulmonary Rehabilitation for the exercise Encouraged to attend Pulmonary Rehabilitation for the exercise     Continue Psychosocial Services  Follow up required by staff Follow up required by staff --              Psychosocial Discharge (Final Psychosocial Re-Evaluation):  Psychosocial Re-Evaluation - 09/02/23 1123       Psychosocial Re-Evaluation   Current issues with Current Psychotropic Meds;Current  Anxiety/Panic;History of Depression    Comments Deatra Face continues to meet with her therapist every two weeks. She has been doing that for years and plans to continue. The stressful situation with her sister has calmed down some. Her cruise went really well and she was very proud of herself for braving it on her own. She has planned another cruise with her neice in a couple weeks. They want her to go ziplining and sing karoke, which is out of character for her she says but she is excited to try. She feels this program has helped her feel more confident and strong to try new things like that. Her family also has a big cruise planned for this summer that she looking forward to. She states she already has a game plan to stay active with [facility] and continuing her water aerobics. She feels at peace with her life currently and plans to keep incorporating positive health habits    Expected Outcomes Short: attend the program until graduation to help solidify positive health habits. Long: maintain mental health habits    Interventions Encouraged to attend Pulmonary Rehabilitation for the exercise             Education: Education Goals: Education classes will be provided on a weekly basis, covering required topics. Participant will state understanding/return demonstration of topics presented.  Learning Barriers/Preferences:  Learning Barriers/Preferences - 04/14/23 1018       Learning Barriers/Preferences   Learning Barriers None    Learning Preferences None             General Pulmonary Education Topics:  Infection Prevention: - Provides verbal and written material to individual with discussion of infection control including proper hand washing and proper equipment cleaning during exercise session. Flowsheet Row Pulmonary Rehab from 07/03/2023 in [facility] Cardiac and Pulmonary Rehab  Date 04/28/23  Educator [facility]  Instruction Review Code 1- Verbalizes Understanding       Falls  Prevention: - Provides verbal and written material to individual with discussion of falls prevention and safety. Flowsheet Row Pulmonary Rehab from 07/03/2023 in [facility] Cardiac and Pulmonary Rehab  Date 04/28/23  Educator [facility]  Instruction Review Code 1- Verbalizes Understanding       Chronic Lung Disease Review: - Group verbal instruction with posters, models, PowerPoint presentations and videos,  to review new updates, new respiratory medications, new advancements in procedures and treatments. Providing information on websites and "800" numbers for continued self-education. Includes information about supplement oxygen , available portable oxygen  systems, continuous and intermittent flow rates, oxygen  safety, concentrators, and Medicare reimbursement for oxygen . Explanation of Pulmonary Drugs, including class, frequency, complications, importance of spacers, rinsing mouth after steroid MDI's, and proper cleaning methods for nebulizers. Review of basic lung anatomy and physiology related to function, structure, and complications of lung disease. Review of risk factors. Discussion about methods for diagnosing sleep apnea and types of masks and machines for OSA. Includes a review of the use of types of environmental controls: home humidity, furnaces, filters, dust mite/pet prevention, HEPA vacuums. Discussion about weather changes, air quality and the benefits of nasal washing. Instruction on Warning signs, infection symptoms, calling MD promptly, preventive modes, and value of vaccinations. Review of effective airway clearance, coughing and/or vibration techniques. Emphasizing that all should Create an Action Plan. Written material given at graduation. Flowsheet Row Pulmonary Rehab from 07/03/2023  in [facility] Cardiac and Pulmonary Rehab  Education need identified 04/28/23  Date 05/15/23  Educator [facility]  Instruction Review Code 1- Verbalizes Understanding       AED/CPR: - Group verbal and written instruction  with the use of models to demonstrate the basic use of the AED with the basic ABC's of resuscitation.    Anatomy and Cardiac Procedures: - Group verbal and visual presentation and models provide information about basic cardiac anatomy and function. Reviews the testing methods done to diagnose heart disease and the outcomes of the test results. Describes the treatment choices: Medical Management, Angioplasty, or Coronary Bypass Surgery for treating various heart conditions including Myocardial Infarction, Angina, Valve Disease, and Cardiac Arrhythmias.  Written material given at graduation.   Medication Safety: - Group verbal and visual instruction to review commonly prescribed medications for heart and lung disease. Reviews the medication, class of the drug, and side effects. Includes the steps to properly store meds and maintain the prescription regimen.  Written material given at graduation.   Other: -Provides group and verbal instruction on various topics (see comments)   Knowledge Questionnaire Score:  Knowledge Questionnaire Score - 04/28/23 1513       Knowledge Questionnaire Score   Pre Score 11/18              Core Components/Risk Factors/Patient Goals at Admission:  Personal Goals and Risk Factors at Admission - 04/28/23 1512       Core Components/Risk Factors/Patient Goals on Admission    Weight Management Yes;Weight Loss    Intervention Weight Management/Obesity: Establish reasonable short term and long term weight goals.;Weight Management: Provide education and appropriate resources to help participant work on and attain dietary goals.;Weight Management: Develop a combined nutrition and exercise program designed to reach desired caloric intake, while maintaining appropriate intake of nutrient and fiber, sodium and fats, and appropriate energy expenditure required for the weight goal.    Admit Weight 210 lb (95.3 kg)    Goal Weight: Short Term 200 lb (90.7 kg)    Goal  Weight: Long Term 180 lb (81.6 kg)    Expected Outcomes Short Term: Continue to assess and modify interventions until short term weight is achieved;Long Term: Adherence to nutrition and physical activity/exercise program aimed toward attainment of established weight goal;Weight Loss: Understanding of general recommendations for a balanced deficit meal plan, which promotes 1-2 lb weight loss per week and includes a negative energy balance of [phone] kcal/d;Understanding recommendations for meals to include 15-35% energy as protein, 25-35% energy from fat, 35-60% energy from carbohydrates, less than 200mg  of dietary cholesterol, 20-35 gm of total fiber daily;Understanding of distribution of calorie intake throughout the day with the consumption of 4-5 meals/snacks    Improve shortness of breath with ADL's Yes    Intervention Provide education, individualized exercise plan and daily activity instruction to help decrease symptoms of SOB with activities of daily living.    Expected Outcomes Short Term: Improve cardiorespiratory fitness to achieve a reduction of symptoms when performing ADLs;Long Term: Be able to perform more ADLs without symptoms or delay the onset of symptoms    Hypertension Yes    Intervention Provide education on lifestyle modifcations including regular physical activity/exercise, weight management, moderate sodium restriction and increased consumption of fresh fruit, vegetables, and low fat dairy, alcohol moderation, and smoking cessation.;Monitor prescription use compliance.    Expected Outcomes Short Term: Continued assessment and intervention until BP is < 140/75mm HG in hypertensive participants. < 130/64mm HG in hypertensive participants  with diabetes, heart failure or chronic kidney disease.;Long Term: Maintenance of blood pressure at goal levels.    Lipids Yes    Intervention Provide education and support for participant on nutrition & aerobic/resistive exercise along with prescribed  medications to achieve LDL 70mg , HDL >40mg .    Expected Outcomes Short Term: Participant states understanding of desired cholesterol values and is compliant with medications prescribed. Participant is following exercise prescription and nutrition guidelines.;Long Term: Cholesterol controlled with medications as prescribed, with individualized exercise RX and with personalized nutrition plan. Value goals: LDL < 70mg , HDL > 40 mg.             Education:Diabetes - Individual verbal and written instruction to review signs/symptoms of diabetes, desired ranges of glucose level fasting, after meals and with exercise. Acknowledge that pre and post exercise glucose checks will be done for 3 sessions at entry of program.   Know Your Numbers and Heart Failure: - Group verbal and visual instruction to discuss disease risk factors for cardiac and pulmonary disease and treatment options.  Reviews associated critical values for Overweight/Obesity, Hypertension, Cholesterol, and Diabetes.  Discusses basics of heart failure: signs/symptoms and treatments.  Introduces Heart Failure Zone chart for action plan for heart failure.  Written material given at graduation. Flowsheet Row Pulmonary Rehab from 07/03/2023 in [facility] Cardiac and Pulmonary Rehab  Education need identified 04/28/23  Date 05/22/23  Educator [facility]  Instruction Review Code 1- Verbalizes Understanding       Core Components/Risk Factors/Patient Goals Review:   Goals and Risk Factor Review     Row Name 06/05/23 1123 06/24/23 1120 09/02/23 1119         Core Components/Risk Factors/Patient Goals Review   Personal Goals Review Improve shortness of breath with ADL's Hypertension;Improve shortness of breath with ADL's Hypertension;Lipids;Improve shortness of breath with ADL's     Review Spoke to patient about their shortness of breath and what they can do to improve. Patient has been informed of breathing techniques when starting the program.  Patient is informed to tell staff if they have had any med changes and that certain meds they are taking or not taking can be causing shortness of breath. Pat's blood pressure medication was stopped. She notes that her pressures have been stable since then. She thinks a lot of her blood pressure issues were coming from family stressors and now that those have been removed, her blood pressure has been more stable. She is encouraged to check her blood pressure regularly with her home blood pressure monitor. She has been working on her breathing techniques while in the program and has noted some increase her stamina. She is hopeful that coming regularly and continuing to do her water aerobics will help keep her motivated to keep up her hard work. Deatra Face saw her doctor last week and received a great report. She can stay off her blood pressure medications. She has been checking her bp at home and it has been stable. Her lipids are within the limits her doctor wants. She notes her shortness of breathing has improved and practices deep breathing when needed. She has about 10 more sessions left in the program and wants to keep improving on her risk factor management     Expected Outcomes Short: Attend [facility] regularly to improve shortness of breath with ADL's. Long: maintain independence with ADL's Short: attend the program consistently to work on her blood pressure managment and breathing techniques. Long: independently manage her risk factors Short: graduate from the program  with confidence in managing her risk factors. Long: independently manage her risk factors.              Core Components/Risk Factors/Patient Goals at Discharge (Final Review):   Goals and Risk Factor Review - 09/02/23 1119       Core Components/Risk Factors/Patient Goals Review   Personal Goals Review Hypertension;Lipids;Improve shortness of breath with ADL's    Review Deatra Face saw her doctor last week and received a great report. She can  stay off her blood pressure medications. She has been checking her bp at home and it has been stable. Her lipids are within the limits her doctor wants. She notes her shortness of breathing has improved and practices deep breathing when needed. She has about 10 more sessions left in the program and wants to keep improving on her risk factor management    Expected Outcomes Short: graduate from the program with confidence in managing her risk factors. Long: independently manage her risk factors.             ITP Comments:  ITP Comments     Row Name 04/14/23 1016 04/28/23 1458 05/21/23 0952 06/18/23 0838 07/09/23 1128   ITP Comments Virtual Visit completed. Patient informed on EP and RD appointment and 6 Minute walk test. Patient also informed of patient health questionnaires on My Chart. Patient Verbalizes understanding. Visit diagnosis can be found in [facility] 09/30/2022. Completed and gym orientation. Initial ITP created and sent for review to Dr. Faud Aleskerov, Medical Director. 30 Day review completed. Medical Director ITP review done, changes made as directed, and signed approval by Medical Director.    new to program 30 Day review completed. Medical Director ITP review done, changes made as directed, and signed approval by Medical Director. 30 Day review completed. Medical Director ITP review done, changes made as directed, and signed approval by Medical Director.    Row Name 08/06/23 0945 09/03/23 1252         ITP Comments 30 Day review completed. Medical Director ITP review done, changes made as directed, and signed approval by Medical Director. 30 Day review completed. Medical Director ITP review done, changes made as directed, and signed approval by Medical Director.               Comments:

## 2023-09-04 ENCOUNTER — Encounter: Payer: Medicare Other | Admitting: *Deleted

## 2023-09-04 DIAGNOSIS — D860 Sarcoidosis of lung: Secondary | ICD-10-CM

## 2023-09-04 NOTE — Progress Notes (Signed)
Daily Session Note  Patient Details  Name: Melissa [name] MRN: [id number] Date of Birth: 1950/09/25 Referring Provider:   Flowsheet Row Pulmonary Rehab from 04/28/2023 in [facility] Cardiac and Pulmonary Rehab  Referring Provider Dr. Belia Heman       Encounter Date: 09/04/2023  Check In:  Session Check In - 09/04/23 1117       Check-In   Supervising physician immediately available to respond to emergencies See telemetry face sheet for immediately available ER MD    Location [facility]-Cardiac & Pulmonary Rehab    Staff Present Cora Collum, RN, BSN, CCRP;Joseph Hood RCP,RRT,BSRT;Margaret Best, MS, Exercise Physiologist;Meredith Jewel Baize RN,BSN    Virtual Visit No    Medication changes reported     No    Warm-up and Cool-down Performed on first and last piece of equipment    Resistance Training Performed Yes    VAD Patient? No    PAD/SET Patient? No      Pain Assessment   Currently in Pain? No/denies                Social History   Tobacco Use  Smoking Status Never  Smokeless Tobacco Never  Tobacco Comments   Second hand smoke exposure    Goals Met:  Proper associated with RPD/PD & O2 Sat Independence with exercise equipment Exercise tolerated well No report of concerns or symptoms today  Goals Unmet:  Not Applicable  Comments: Pt able to follow exercise prescription today without complaint.  Will continue to monitor for progression.    Dr. Bethann Punches is Medical Director for [facility] Cardiac Rehabilitation.  Dr. Vida Rigger is Medical Director for [facility] Pulmonary Rehabilitation.

## 2023-09-09 DIAGNOSIS — N1832 Chronic kidney disease, stage 3b: Secondary | ICD-10-CM | POA: Diagnosis not present

## 2023-09-09 DIAGNOSIS — Q613 Polycystic kidney, unspecified: Secondary | ICD-10-CM | POA: Diagnosis not present

## 2023-09-09 DIAGNOSIS — D631 Anemia in chronic kidney disease: Secondary | ICD-10-CM | POA: Diagnosis not present

## 2023-09-09 DIAGNOSIS — R319 Hematuria, unspecified: Secondary | ICD-10-CM | POA: Diagnosis not present

## 2023-09-09 DIAGNOSIS — D860 Sarcoidosis of lung: Secondary | ICD-10-CM

## 2023-09-09 DIAGNOSIS — D573 Sickle-cell trait: Secondary | ICD-10-CM | POA: Diagnosis not present

## 2023-09-09 DIAGNOSIS — I129 Hypertensive chronic kidney disease with stage 1 through stage 4 chronic kidney disease, or unspecified chronic kidney disease: Secondary | ICD-10-CM | POA: Diagnosis not present

## 2023-09-09 NOTE — Progress Notes (Signed)
Daily Session Note  Patient Details  Name: Melissa [name] MRN: [id number] Date of Birth: 1950/11/01 Referring Provider:   Flowsheet Row Pulmonary Rehab from 04/28/2023 in [facility] Cardiac and Pulmonary Rehab  Referring Provider Dr. Belia Heman       Encounter Date: 09/09/2023  Check In:  Session Check In - 09/09/23 1101       Check-In   Supervising physician immediately available to respond to emergencies See telemetry face sheet for immediately available ER MD    Location [facility]-Cardiac & Pulmonary Rehab    Staff Present Kelton Pillar RN,BSN,MPA;Jason Wallace Cullens RDN,LDN;Maxon Conetta BS, Exercise Physiologist;Meredith Jewel Baize RN,BSN    Virtual Visit No    Medication changes reported     No    Fall or balance concerns reported    No    Warm-up and Cool-down Performed on first and last piece of equipment    Resistance Training Performed Yes    VAD Patient? No    PAD/SET Patient? No      Pain Assessment   Currently in Pain? No/denies                Social History   Tobacco Use  Smoking Status Never  Smokeless Tobacco Never  Tobacco Comments   Second hand smoke exposure    Goals Met:  Independence with exercise equipment Exercise tolerated well No report of concerns or symptoms today Strength training completed today  Goals Unmet:  Not Applicable  Comments: Pt able to follow exercise prescription today without complaint.  Will continue to monitor for progression.    Dr. Bethann Punches is Medical Director for [facility] Cardiac Rehabilitation.  Dr. Vida Rigger is Medical Director for [facility] Pulmonary Rehabilitation.

## 2023-09-11 ENCOUNTER — Encounter: Payer: Medicare Other | Admitting: *Deleted

## 2023-09-11 DIAGNOSIS — D860 Sarcoidosis of lung: Secondary | ICD-10-CM

## 2023-09-11 NOTE — Progress Notes (Signed)
Daily Session Note  Patient Details  Name: Melissa [name] MRN: [id number] Date of Birth: 03/04/1951 Referring Provider:   Flowsheet Row Pulmonary Rehab from 04/28/2023 in [facility] Cardiac and Pulmonary Rehab  Referring Provider Dr. Belia Heman       Encounter Date: 09/11/2023  Check In:  Session Check In - 09/11/23 1120       Check-In   Supervising physician immediately available to respond to emergencies See telemetry face sheet for immediately available ER MD    Location [facility]-Cardiac & Pulmonary Rehab    Staff Present Cora Collum, RN, BSN, CCRP;Meredith Jewel Baize RN,BSN;Maxon Milpitas BS, Exercise Physiologist    Virtual Visit No    Medication changes reported     No    Fall or balance concerns reported    No    Warm-up and Cool-down Performed on first and last piece of equipment    Resistance Training Performed Yes    VAD Patient? No    PAD/SET Patient? No      Pain Assessment   Currently in Pain? No/denies                Social History   Tobacco Use  Smoking Status Never  Smokeless Tobacco Never  Tobacco Comments   Second hand smoke exposure    Goals Met:  Proper associated with RPD/PD & O2 Sat Independence with exercise equipment Exercise tolerated well No report of concerns or symptoms today  Goals Unmet:  Not Applicable  Comments: Pt able to follow exercise prescription today without complaint.  Will continue to monitor for progression.    Dr. Bethann Punches is Medical Director for [facility] Cardiac Rehabilitation.  Dr. Vida Rigger is Medical Director for [facility] Pulmonary Rehabilitation.

## 2023-09-23 ENCOUNTER — Encounter: Payer: Medicare Other | Attending: Internal Medicine | Admitting: *Deleted

## 2023-09-23 VITALS — Ht 72.0 in | Wt 198.3 lb

## 2023-09-23 DIAGNOSIS — D860 Sarcoidosis of lung: Secondary | ICD-10-CM | POA: Diagnosis not present

## 2023-09-23 NOTE — Progress Notes (Signed)
Daily Session Note  Patient Details  Name: Melissa [name] MRN: [id number] Date of Birth: 1950/09/02 Referring Provider:   Flowsheet Row Pulmonary Rehab from 04/28/2023 in [facility] Cardiac and Pulmonary Rehab  Referring Provider Dr. Belia Heman       Encounter Date: 09/23/2023  Check In:  Session Check In - 09/23/23 1116       Check-In   Supervising physician immediately available to respond to emergencies See telemetry face sheet for immediately available ER MD    Location [facility]-Cardiac & Pulmonary Rehab    Staff Present Rory Percy, MS, Exercise Physiologist;Maxon Conetta BS, Exercise Physiologist;Jason Wallace Cullens RDN,LDN;Mariya Mottley, RN, BSN, CCRP;Meredith Craven RN,BSN;Noah Tickle, BS, Exercise Physiologist    Virtual Visit No    Medication changes reported     No    Fall or balance concerns reported    No    Warm-up and Cool-down Performed on first and last piece of equipment    Resistance Training Performed Yes    VAD Patient? No    PAD/SET Patient? No      Pain Assessment   Currently in Pain? No/denies                Social History   Tobacco Use  Smoking Status Never  Smokeless Tobacco Never  Tobacco Comments   Second hand smoke exposure    Goals Met:  Proper associated with RPD/PD & O2 Sat Independence with exercise equipment Exercise tolerated well No report of concerns or symptoms today  Goals Unmet:  Not Applicable  Comments: Pt able to follow exercise prescription today without complaint.  Will continue to monitor for progression.    Dr. Bethann Punches is Medical Director for [facility] Cardiac Rehabilitation.  Dr. Vida Rigger is Medical Director for [facility] Pulmonary Rehabilitation.

## 2023-09-23 NOTE — Patient Instructions (Signed)
 Discharge Patient Instructions  Patient Details  Name: Melissa [name] MRN: [id number] Date of Birth: [birth year] Referring Provider:  Sowles, Krichna, MD   Number of Visits: 55  Reason for Discharge:  Patient reached a stable level of exercise. Patient independent in their exercise. Patient has met program and personal goals.   Diagnosis:  Sarcoidosis of lung (HCC)  Initial Exercise Prescription:  Initial Exercise Prescription - 04/28/23 1500       Date of Initial Exercise RX and Referring Provider   Date 04/28/23    Referring Provider Dr. Isaiah      Oxygen    Maintain Oxygen  Saturation 88% or higher      Treadmill   MPH 3    Grade 0    Minutes 15    METs 3.5      NuStep   Level 2   T6   SPM 80    Minutes 15    METs 3.45      Elliptical   Level 1    Speed 2.5    Minutes 15    METs 3.45      REL-XR   Level 2    Speed 50    Minutes 15    METs 3.45      T5 Nustep   Level 2    SPM 80    Minutes 15    METs 3.45      Prescription Details   Frequency (times per week) 3    Duration Progress to 30 minutes of continuous aerobic without signs/symptoms of physical distress      Intensity   THRR 40-80% of Max Heartrate 115-138    Ratings of Perceived Exertion 11-13    Perceived Dyspnea 0-4      Progression   Progression Continue to progress workloads to maintain intensity without signs/symptoms of physical distress.      Resistance Training   Training Prescription Yes    Weight 4    Reps 10-15             Discharge Exercise Prescription (Final Exercise Prescription Changes):  Exercise Prescription Changes - 09/10/23 1400       Response to Exercise   Blood Pressure (Admit) 124/70    Blood Pressure (Exit) 118/78    Heart Rate (Admit) 97 bpm    Heart Rate (Exercise) 146 bpm    Heart Rate (Exit) 115 bpm    Oxygen  Saturation (Admit) 96 %    Oxygen  Saturation (Exercise) 93 %    Oxygen  Saturation (Exit) 94 %    Rating of Perceived Exertion  (Exercise) 13    Perceived Dyspnea (Exercise) 0    Symptoms none    Duration Continue with 30 min of aerobic exercise without signs/symptoms of physical distress.    Intensity THRR unchanged      Progression   Progression Continue to progress workloads to maintain intensity without signs/symptoms of physical distress.    Average METs 5.04      Resistance Training   Training Prescription Yes    Weight 5 lb    Reps 10-15      Interval Training   Interval Training No      Treadmill   MPH 3.3    Grade 1    Minutes 15    METs 3.98      Elliptical   Level 1    Speed 4.9    Minutes 15      REL-XR   Level 7  Minutes 15    METs 7.8      Home Exercise Plan   Plans to continue exercise at [facility] (comment)   [employer], water aerobics, aerobic machines, weight machines and handweights   Frequency Add 2 additional days to program exercise sessions.    Initial Home Exercises Provided 07/31/23      Oxygen    Maintain Oxygen  Saturation 88% or higher             Functional Capacity:  6 Minute Walk     Row Name 04/28/23 1459 09/23/23 1204       6 Minute Walk   Phase Initial Discharge    Distance 1290 feet 1520 feet    Distance % Change -- 17.8 %    Distance Feet Change -- 230 ft    Walk Time 6 minutes 6 minutes    # of Rest Breaks 0 0    MPH 2.44 2.9    METS 3.45 3.45    RPE 13 9    Perceived Dyspnea  0 0    VO2 Peak 12.06 --    Symptoms No No    Resting HR 93 bpm 108 bpm    Resting BP 122/72 104/62    Resting Oxygen  Saturation  95 % 95 %    Exercise Oxygen  Saturation  during 6 min walk 93 % 92 %    Max Ex. HR 130 bpm 100 bpm    Max Ex. BP 158/80 128/66    2 Minute Post BP 118/62 112/64      Interval HR   1 Minute HR 114 100    2 Minute HR 117 --    3 Minute HR 118 --    4 Minute HR 120 --    5 Minute HR 130 --    6 Minute HR 127 100    2 Minute Post HR 101 99    Interval Heart Rate? Yes Yes      Interval Oxygen    Interval Oxygen ? Yes Yes     Baseline Oxygen  Saturation % 95 % 89 %    1 Minute Oxygen  Saturation % 98 % --    1 Minute Liters of Oxygen  0 L --    2 Minute Oxygen  Saturation % 93 % --    2 Minute Liters of Oxygen  0 L --    3 Minute Oxygen  Saturation % 94 % 87 %    3 Minute Liters of Oxygen  0 L --    4 Minute Oxygen  Saturation % 95 % 84 %    4 Minute Liters of Oxygen  0 L --    5 Minute Oxygen  Saturation % 93 % 85 %    5 Minute Liters of Oxygen  0 L --    6 Minute Oxygen  Saturation % 95 % 92 %    6 Minute Liters of Oxygen  0 L --    2 Minute Post Oxygen  Saturation % 94 % 96 %    2 Minute Post Liters of Oxygen  0 L --            Nutrition & Weight - Outcomes:  Pre Biometrics - 04/28/23 1511       Pre Biometrics   Height 6' (1.829 m)    Weight 210 lb (95.3 kg)    Waist Circumference 36 inches    Hip Circumference 47 inches    Waist to Hip Ratio 0.77 %    BMI (Calculated) 28.47    Single Leg Stand 9.43  seconds             Post Biometrics - 09/23/23 1209        Post  Biometrics   Height 6' (1.829 m)    Weight 198 lb 4.8 oz (89.9 kg)    Waist Circumference 32 inches    Hip Circumference 43 inches    Waist to Hip Ratio 0.74 %    BMI (Calculated) 26.89    Single Leg Stand 30 seconds             Goals reviewed with patient; copy given to patient.

## 2023-09-25 ENCOUNTER — Encounter: Payer: Medicare Other | Admitting: *Deleted

## 2023-09-25 DIAGNOSIS — D860 Sarcoidosis of lung: Secondary | ICD-10-CM | POA: Diagnosis not present

## 2023-09-25 NOTE — Progress Notes (Signed)
 Daily Session Note  Patient Details  Name: Melissa [name] MRN: [id number] Date of Birth: 1950-12-17 Referring Provider:   Flowsheet Row Pulmonary Rehab from 04/28/2023 in [facility] Card[facility] Pul[facility] Re[facility]  Referring Provider Dr. Isaiah       Encounter Date: 09/25/2023  Check In:  Session Check In - 09/25/23 1118       Check-In   Supervising physician immediately available to respond to emergencies See telemetry face sheet for immediately available ER MD    Location [facility]-Cardiac & Pulmonary Rehab    Staff Present Othel Durand, RN, BSN, CCRP;Maxon Conetta BS, Exercise Physiologist;Joseph Hood RCP,RRT,BSRT;Meredith Craven RN,BSN;Noah [place], [place], Exercise Physiologist    Virtual Visit No    Medication changes reported     No    Fall or balance concerns reported    No    Warm-up and Cool-down Performed on first and last piece of equipment    Resistance Training Performed Yes    VAD Patient? No    PAD/SET Patient? No      Pain Assessment   Currently in Pain? No/denies                Social History   Tobacco Use  Smoking Status Never  Smokeless Tobacco Never  Tobacco Comments   Second hand smoke exposure    Goals Met:  Proper associated with RPD/PD & O2 Sat Independence with exercise equipment Exercise tolerated well No report of concerns or symptoms today  Goals Unmet:  Not Applicable  Comments: Pt able to follow exercise prescription today without complaint.  Will continue to monitor for progression.    Dr. Oneil Pinal is Medical Director for [facility] Cardiac Rehabilitation.  Dr. Fuad Aleskerov is Medical Director for [facility] Pulmonary Rehabilitation.

## 2023-09-26 ENCOUNTER — Encounter (INDEPENDENT_AMBULATORY_CARE_PROVIDER_SITE_OTHER): Payer: Medicare Other | Admitting: Vascular Surgery

## 2023-09-30 ENCOUNTER — Encounter: Payer: Medicare Other | Admitting: *Deleted

## 2023-09-30 DIAGNOSIS — D860 Sarcoidosis of lung: Secondary | ICD-10-CM

## 2023-09-30 NOTE — Progress Notes (Signed)
Daily Session Note  Patient Details  Name: Melissa [name] MRN: [id number] Date of Birth: 1951-01-20 Referring Provider:   Flowsheet Row Pulmonary Rehab from 04/28/2023 in [facility] Cardiac and Pulmonary Rehab  Referring Provider Dr. Belia Heman       Encounter Date: 09/30/2023  Check In:  Session Check In - 09/30/23 1116       Check-In   Supervising physician immediately available to respond to emergencies See telemetry face sheet for immediately available ER MD    Location [facility]-Cardiac & Pulmonary Rehab    Staff Present Rory Percy, MS, Exercise Physiologist;Kasen Sako, RN, BSN, CCRP;Jason Wallace [place] RDN,LDN;Maxon [employer], Exercise Physiologist;Meredith Jewel Baize RN,BSN;Noah Tickle, BS, Exercise Physiologist    Virtual Visit No    Medication changes reported     No    Fall or balance concerns reported    No    Warm-up and Cool-down Performed on first and last piece of equipment    Resistance Training Performed Yes    VAD Patient? No    PAD/SET Patient? No      Pain Assessment   Currently in Pain? No/denies                Social History   Tobacco Use  Smoking Status Never  Smokeless Tobacco Never  Tobacco Comments   Second hand smoke exposure    Goals Met:  Proper associated with RPD/PD & O2 Sat Independence with exercise equipment Exercise tolerated well No report of concerns or symptoms today  Goals Unmet:  Not Applicable  Comments: Pt able to follow exercise prescription today without complaint.  Will continue to monitor for progression.    Dr. Bethann Punches is Medical Director for [facility] Cardiac Rehabilitation.  Dr. Vida Rigger is Medical Director for [facility] Pulmonary Rehabilitation.

## 2023-10-01 ENCOUNTER — Encounter: Payer: Self-pay | Admitting: *Deleted

## 2023-10-01 DIAGNOSIS — D860 Sarcoidosis of lung: Secondary | ICD-10-CM

## 2023-10-01 NOTE — Progress Notes (Signed)
 Pulmonary Individual Treatment Plan  Patient Details  Name: Melissa [name] MRN: [id number] Date of Birth: 1951/03/22 Referring Provider:   Flowsheet Row Pulmonary Rehab from 04/28/2023 in [facility] Cardiac and Pulmonary Rehab  Referring Provider Dr. Belia Heman       Initial Encounter Date:  Flowsheet Row Pulmonary Rehab from 04/28/2023 in [facility] Cardiac and Pulmonary Rehab  Date 04/28/23       Visit Diagnosis: Sarcoidosis of lung (HCC)  Patient's Home Medications on Admission:  Current Outpatient Medications:    acetaminophen (TYLENOL) 650 MG CR tablet, Take 1,300 mg by mouth every 8 (eight) hours as needed for pain., Disp: , Rfl:    albuterol (VENTOLIN HFA) 108 (90 Base) MCG/ACT inhaler, INHALE 1-2 PUFFS BY MOUTH EVERY 6 HOURS AS NEEDED FOR WHEEZE OR SHORTNESS OF BREATH, Disp: 18 each, Rfl: 0   Ascorbic Acid (VITAMIN C) 1000 MG tablet, Take 1,000 mg by mouth daily., Disp: , Rfl:    aspirin EC 81 MG tablet, Take 1 tablet by mouth daily., Disp: , Rfl:    atorvastatin (LIPITOR) 40 MG tablet, Take 1 tablet (40 mg total) by mouth daily., Disp: 90 tablet, Rfl: 1   azelastine (ASTELIN) 0.1 % nasal spray, Place 2 sprays into both nostrils 2 (two) times daily. Use in each nostril as directed, Disp: 30 mL, Rfl: 12   calcium carbonate (TUMS - DOSED IN MG ELEMENTAL CALCIUM) 500 MG chewable tablet, Chew 500-1,000 mg by mouth daily as needed for indigestion or heartburn., Disp: , Rfl:    Cholecalciferol (D 1000) 25 MCG (1000 UT) capsule, Take 1,000 Units by mouth daily., Disp: , Rfl:    ezetimibe (ZETIA) 10 MG tablet, TAKE 1 TABLET BY MOUTH EVERY DAY, Disp: 90 tablet, Rfl: 1   fluticasone (FLONASE) 50 MCG/ACT nasal spray, SPRAY 2 SPRAYS INTO EACH NOSTRIL EVERY DAY, Disp: 48 mL, Rfl: 1   Fluticasone-Umeclidin-Vilant (TRELEGY ELLIPTA) 200-62.5-25 MCG/ACT AEPB, INHALE ONE ACTUATION INTO THE LUNGS DAILY. Please schedule office visit before any future refills. (Patient not taking: Reported on 08/27/2023), Disp: 60  each, Rfl: 0   gabapentin (NEURONTIN) 100 MG capsule, Take 100-200 mg by mouth 2 (two) times daily. One in am and two at night, Disp: , Rfl:    JARDIANCE 10 MG TABS tablet, Take 10 mg by mouth daily., Disp: , Rfl:    levocetirizine (XYZAL ALLERGY 24HR) 5 MG tablet, Take 1 tablet (5 mg total) by mouth every evening., Disp: 30 tablet, Rfl: 6   magnesium oxide (MAG-OX) 400 MG tablet, Take 400 mg by mouth daily., Disp: , Rfl:    Multiple Vitamins-Minerals (CENTRUM SILVER) tablet, Take 1 tablet by mouth daily., Disp: 30 tablet, Rfl: 0   nortriptyline (PAMELOR) 10 MG capsule, Take 10-20 mg by mouth at bedtime., Disp: , Rfl:    Probiotic Product (FORTIFY PROBIOTIC WOMENS EX ST PO), Take by mouth daily., Disp: , Rfl:    propranolol (INDERAL) 10 MG tablet, Take 1 tablet (10 mg total) by mouth 3 (three) times daily as needed. For sustained fast heart rate, Disp: 60 tablet, Rfl: 3   zinc gluconate 50 MG tablet, Take 50 mg by mouth daily., Disp: , Rfl:   Past Medical History: Past Medical History:  Diagnosis Date   Aortic atherosclerosis (HCC)    Asthma    Bruising    Cardiomyopathy (HCC)    Celiac artery aneurysm (HCC)    Cervical radiculopathy    CKD (chronic kidney disease), stage III (HCC)    Deficiency of vitamin B  Depression    Edema    Flank pain    Gross hematuria    History of 2019 novel coronavirus disease (COVID-19) 05/01/2019   History of bariatric surgery    HLD (hyperlipidemia)    HTN (hypertension)    Interstitial lung disease (HCC)    Iron deficiency anemia    Leukopenia    OA (osteoarthritis)    Obesity    OSA on CPAP    Osteopenia    Perennial allergic rhinitis    Pneumonia due to COVID-19 virus 2020   Renal cysts, acquired, bilateral    RLS (restless legs syndrome)    Sarcoidosis    Sickle cell trait (HCC)    Snoring    Spinal stenosis    Trigeminal neuralgia     Tobacco Use: Social History   Tobacco Use  Smoking Status Never  Smokeless Tobacco Never   Tobacco Comments   Second hand smoke exposure    Labs: Review Flowsheet  More data exists      Latest Ref Rng & Units 10/18/2020 04/10/2021 04/10/2022 01/17/2023 01/18/2023  Labs for ITP Cardiac and Pulmonary Rehab  Cholestrol 0 - 200 mg/dL - 130  865  - 784   LDL (calc) 0 - 99 mg/dL - 94  57  - 54   HDL-C >40 mg/dL - 92  82  - 72   Trlycerides <150 mg/dL - 72  50  - 42   Hemoglobin A1c 4.8 - 5.6 % - - - 5.9  -  TCO2 22 - 32 mmol/L 25  - - - -     Pulmonary Assessment Scores:  Pulmonary Assessment Scores     Row Name 04/28/23 1515         ADL UCSD   ADL Phase Entry     SOB Score total 14     Rest 0     Walk 0     Stairs 2     Bath 0     Dress 1     Shop 2       CAT Score   CAT Score 6       mMRC Score   mMRC Score 1              UCSD: Self-administered rating of dyspnea associated with activities of daily living (ADLs) 6-point scale (0 = "not at all" to 5 = "maximal or unable to do because of breathlessness")  Scoring Scores range from 0 to 120.  Minimally important difference is 5 units  CAT: CAT can identify the health impairment of COPD patients and is better correlated with disease progression.  CAT has a scoring range of zero to 40. The CAT score is classified into four groups of low (less than 10), medium (10 - 20), high (21-30) and very high (31-40) based on the impact level of disease on health status. A CAT score over 10 suggests significant symptoms.  A worsening CAT score could be explained by an exacerbation, poor medication adherence, poor inhaler technique, or progression of COPD or comorbid conditions.  CAT MCID is 2 points  mMRC: mMRC (Modified Medical Research Council) Dyspnea Scale is used to assess the degree of baseline functional disability in patients of respiratory disease due to dyspnea. No minimal important difference is established. A decrease in score of 1 point or greater is considered a positive change.   Pulmonary Function  Assessment:  Pulmonary Function Assessment - 04/14/23 1018       Breath  Shortness of Breath No             Exercise Target Goals: Exercise Program Goal: Individual exercise prescription set using results from initial 6 min walk test and THRR while considering  patient's activity barriers and safety.   Exercise Prescription Goal: Initial exercise prescription builds to 30-45 minutes a day of aerobic activity, 2-3 days per week.  Home exercise guidelines will be given to patient during program as part of exercise prescription that the participant will acknowledge.  Education: Aerobic Exercise: - Group verbal and visual presentation on the components of exercise prescription. Introduces F.I.T.T principle from ACSM for exercise prescriptions.  Reviews F.I.T.T. principles of aerobic exercise including progression. Written material given at graduation. Flowsheet Row Pulmonary Rehab from 07/03/2023 in [facility] Cardiac and Pulmonary Rehab  Education need identified 04/28/23  Date 06/19/23  Educator MB  Instruction Review Code 1- [employer] Understanding       Education: Resistance Exercise: - Group verbal and visual presentation on the components of exercise prescription. Introduces F.I.T.T principle from ACSM for exercise prescriptions  Reviews F.I.T.T. principles of resistance exercise including progression. Written material given at graduation. Flowsheet Row Pulmonary Rehab from 07/03/2023 in [facility] Cardiac and Pulmonary Rehab  Date 06/12/23  Educator MB  Instruction Review Code 1- [employer] Understanding        Education: Exercise & Equipment Safety: - Individual verbal instruction and demonstration of equipment use and safety with use of the equipment. Flowsheet Row Pulmonary Rehab from 07/03/2023 in [facility] Cardiac and Pulmonary Rehab  Date 04/28/23  Educator [facility]  Instruction Review Code 1- Verbalizes Understanding       Education: Exercise Physiology & General Exercise  Guidelines: - Group verbal and written instruction with models to review the exercise physiology of the cardiovascular system and associated critical values. Provides general exercise guidelines with specific guidelines to those with heart or lung disease.  Flowsheet Row Pulmonary Rehab from 07/03/2023 in [facility] Cardiac and Pulmonary Rehab  Education need identified 04/28/23  Date 06/05/23  Educator MB  Instruction Review Code 1- [employer] Understanding       Education: Flexibility, Balance, Mind/Body Relaxation: - Group verbal and visual presentation with interactive activity on the components of exercise prescription. Introduces F.I.T.T principle from ACSM for exercise prescriptions. Reviews F.I.T.T. principles of flexibility and balance exercise training including progression. Also discusses the mind body connection.  Reviews various relaxation techniques to help reduce and manage stress (i.e. Deep breathing, progressive muscle relaxation, and visualization). Balance handout provided to take home. Written material given at graduation. Flowsheet Row Pulmonary Rehab from 07/03/2023 in [facility] Cardiac and Pulmonary Rehab  Date 06/12/23  Educator MB  Instruction Review Code 1- Verbalizes Understanding       Activity Barriers & Risk Stratification:  Activity Barriers & Cardiac Risk Stratification - 04/28/23 1502       Activity Barriers & Cardiac Risk Stratification   Activity Barriers Right Knee Replacement;Back Problems;Other (comment)    Comments left knee stiffness and some hip pain             6 Minute Walk:  6 Minute Walk     Row Name 04/28/23 1459 09/23/23 1204       6 Minute Walk   Phase Initial Discharge    Distance 1290 feet 1520 feet    Distance % Change -- 17.8 %    Distance Feet Change -- 230 ft    Walk Time 6 minutes 6 minutes    # of Rest Breaks 0  0    MPH 2.44 2.9    METS 3.45 3.45    RPE 13 9    Perceived Dyspnea  0 0    VO2 Peak 12.06 --    Symptoms No  No    Resting HR 93 bpm 108 bpm    Resting BP 122/72 104/62    Resting Oxygen Saturation  95 % 95 %    Exercise Oxygen Saturation  during 6 min walk 93 % 92 %    Max Ex. HR 130 bpm 100 bpm    Max Ex. BP 158/80 128/66    2 Minute Post BP 118/62 112/64      Interval HR   1 Minute HR 114 100    2 Minute HR 117 --    3 Minute HR 118 --    4 Minute HR 120 --    5 Minute HR 130 --    6 Minute HR 127 100    2 Minute Post HR 101 99    Interval Heart Rate? Yes Yes      Interval Oxygen   Interval Oxygen? Yes Yes    Baseline Oxygen Saturation % 95 % 89 %    1 Minute Oxygen Saturation % 98 % --    1 Minute Liters of Oxygen 0 L --    2 Minute Oxygen Saturation % 93 % --    2 Minute Liters of Oxygen 0 L --    3 Minute Oxygen Saturation % 94 % 87 %    3 Minute Liters of Oxygen 0 L --    4 Minute Oxygen Saturation % 95 % 84 %    4 Minute Liters of Oxygen 0 L --    5 Minute Oxygen Saturation % 93 % 85 %    5 Minute Liters of Oxygen 0 L --    6 Minute Oxygen Saturation % 95 % 92 %    6 Minute Liters of Oxygen 0 L --    2 Minute Post Oxygen Saturation % 94 % 96 %    2 Minute Post Liters of Oxygen 0 L --            Oxygen Initial Assessment:  Oxygen Initial Assessment - 04/28/23 1514       Home Oxygen   Home Oxygen Device None    Sleep Oxygen Prescription CPAP    Liters per minute 0    Home Exercise Oxygen Prescription None    Home Resting Oxygen Prescription None    Compliance with Home Oxygen Use Yes      Initial 6 min Walk   Oxygen Used None      Program Oxygen Prescription   Program Oxygen Prescription None      Intervention   Short Term Goals To learn and exhibit compliance with exercise, home and travel O2 prescription;To learn and understand importance of monitoring SPO2 with pulse oximeter and demonstrate accurate use of the pulse oximeter.;To learn and understand importance of maintaining oxygen saturations>88%;To learn and demonstrate proper pursed lip breathing  techniques or other breathing techniques. ;To learn and demonstrate proper use of respiratory medications    Long  Term Goals Exhibits compliance with exercise, home  and travel O2 prescription;Maintenance of O2 saturations>88%;Compliance with respiratory medication;Demonstrates proper use of MDI's;Exhibits proper breathing techniques, such as pursed lip breathing or other method taught during program session;Verbalizes importance of monitoring SPO2 with pulse oximeter and return demonstration  Oxygen Re-Evaluation:  Oxygen Re-Evaluation     Row Name 06/05/23 1122 06/24/23 1115 09/02/23 1135 09/25/23 1123       Program Oxygen Prescription   Program Oxygen Prescription None None None None      Home Oxygen   Home Oxygen Device None None None None    Sleep Oxygen Prescription CPAP CPAP CPAP CPAP    Liters per minute 0 -- 0 0    Home Exercise Oxygen Prescription -- None None None    Home Resting Oxygen Prescription None None None None    Compliance with Home Oxygen Use Yes Yes Yes Yes      Goals/Expected Outcomes   Short Term Goals To learn and demonstrate proper pursed lip breathing techniques or other breathing techniques.  To learn and demonstrate proper pursed lip breathing techniques or other breathing techniques. ;To learn and demonstrate proper use of respiratory medications;To learn and understand importance of maintaining oxygen saturations>88% To learn and demonstrate proper pursed lip breathing techniques or other breathing techniques. ;To learn and demonstrate proper use of respiratory medications;To learn and understand importance of maintaining oxygen saturations>88% Other    Long  Term Goals Exhibits proper breathing techniques, such as pursed lip breathing or other method taught during program session Exhibits proper breathing techniques, such as pursed lip breathing or other method taught during program session;Compliance with respiratory medication;Maintenance of  O2 saturations>88% Exhibits proper breathing techniques, such as pursed lip breathing or other method taught during program session;Maintenance of O2 saturations>88% Other    Comments Informed patient how to perform the Pursed Lipped breathing technique. Told patient to Inhale through the nose and out the mouth with pursed lips to keep their airways open, help oxygenate them better, practice when at rest or doing strenuous activity. Patient Verbalizes understanding of technique and will work on and be reiterated during [facility]. Dennie Bible reports doing well in the program. She has noticed an improvement in her shortness of breath and wants to continue to work on her breathing techniques while enrolled in the program. She is compliant with her CPAP use. She notes that some smells will exacerbate her asthma, like when she was cleaning her oven, so she is reminded to take care with those smells. Dennie Bible reports her shortness of breath has improved since beginning the program. She was able to walk comfortably without significant shortness of breath during her cruise trip and is looking foward to her next cruise since she is feeling even stronger. She is still compliant with her  CPAP use. She has continued ot be mindful of the chemicals she is around to prevent asthma attacks Dennie Bible has no questions about her pulmonary health and feels like she is able to PLB and uses her CPAP routinely.    Goals/Expected Outcomes Short: use PLB with exertion. Long: use PLB on exertion proficiently and independently. Short: practice PLB and awareness on chemical smells. Long: independently manage breathing techniques Short: attend the program to continue working on her breathing. Long: manage her breathing and CPAP management independenlty. Short: Graduate [facility]. Long: maintain Pulmoary health independently.             Oxygen Discharge (Final Oxygen Re-Evaluation):  Oxygen Re-Evaluation - 09/25/23 1123       Program Oxygen  Prescription   Program Oxygen Prescription None      Home Oxygen   Home Oxygen Device None    Sleep Oxygen Prescription CPAP    Liters per minute 0    Home Exercise Oxygen  Prescription None    Home Resting Oxygen Prescription None    Compliance with Home Oxygen Use Yes      Goals/Expected Outcomes   Short Term Goals Other    Long  Term Goals Other    Comments Dennie Bible has no questions about her pulmonary health and feels like she is able to PLB and uses her CPAP routinely.    Goals/Expected Outcomes Short: Graduate [facility]. Long: maintain Pulmoary health independently.             Initial Exercise Prescription:  Initial Exercise Prescription - 04/28/23 1500       Date of Initial Exercise RX and Referring Provider   Date 04/28/23    Referring Provider Dr. Belia Heman      Oxygen   Maintain Oxygen Saturation 88% or higher      Treadmill   MPH 3    Grade 0    Minutes 15    METs 3.5      NuStep   Level 2   T6   SPM 80    Minutes 15    METs 3.45      Elliptical   Level 1    Speed 2.5    Minutes 15    METs 3.45      REL-XR   Level 2    Speed 50    Minutes 15    METs 3.45      T5 Nustep   Level 2    SPM 80    Minutes 15    METs 3.45      Prescription Details   Frequency (times per week) 3    Duration Progress to 30 minutes of continuous aerobic without signs/symptoms of physical distress      Intensity   THRR 40-80% of Max Heartrate 115-138    Ratings of Perceived Exertion 11-13    Perceived Dyspnea 0-4      Progression   Progression Continue to progress workloads to maintain intensity without signs/symptoms of physical distress.      Resistance Training   Training Prescription Yes    Weight 4    Reps 10-15             Perform Capillary Blood Glucose checks as needed.  Exercise Prescription Changes:   Exercise Prescription Changes     Row Name 04/28/23 1500 05/08/23 1600 05/22/23 1700 06/05/23 1400 06/19/23 1300     Response to Exercise    Blood Pressure (Admit) 122/72 112/60 122/62 134/70 122/62   Blood Pressure (Exercise) 158/80 116/74 170/70 160/70 158/60   Blood Pressure (Exit) 118/62 110/60 102/60 110/70 108/52   Heart Rate (Admit) 93 bpm 112 bpm 78 bpm 82 bpm 94 bpm   Heart Rate (Exercise) 130 bpm 128 bpm 132 bpm 132 bpm 124 bpm   Heart Rate (Exit) 101 bpm 113 bpm 126 bpm 105 bpm 130 bpm   Oxygen Saturation (Admit) 95 % 95 % 99 % 98 % 92 %   Oxygen Saturation (Exercise) 93 % 90 % 90 % 81 % 88 %   Oxygen Saturation (Exit) 94 % 98 % 95 % 97 % 92 %   Rating of Perceived Exertion (Exercise) 13 13 15 15 12    Perceived Dyspnea (Exercise) 0 0 0 1 3   Symptoms none none none none none   Comments 6 MWT results First week of exercise -- -- --   Duration -- Progress to 30 minutes of  aerobic without signs/symptoms of physical distress Progress  to 30 minutes of  aerobic without signs/symptoms of physical distress Progress to 30 minutes of  aerobic without signs/symptoms of physical distress Progress to 30 minutes of  aerobic without signs/symptoms of physical distress   Intensity -- THRR unchanged THRR unchanged THRR unchanged THRR unchanged     Progression   Progression -- Continue to progress workloads to maintain intensity without signs/symptoms of physical distress. Continue to progress workloads to maintain intensity without signs/symptoms of physical distress. Continue to progress workloads to maintain intensity without signs/symptoms of physical distress. Continue to progress workloads to maintain intensity without signs/symptoms of physical distress.   Average METs -- 3.72 3.96 3.8 3.31     Resistance Training   Training Prescription -- Yes Yes Yes Yes   Weight -- 4 4 5  lb 5 lb   Reps -- 10-15 10-15 10-15 10-15     Interval Training   Interval Training -- No No No No     Treadmill   MPH -- 2.8 3 3 3    Grade -- 1.5 0.5 0.5 0.5   Minutes -- 15 15 15 15    METs -- 3.72 3.5 3.5 3.5     Recumbant Bike   Level -- -- -- --  3   Watts -- -- -- -- 22   Minutes -- -- -- -- 15   METs -- -- -- -- 2.73     NuStep   Level -- 5 5 -- --   Minutes -- 15 15 -- --   METs -- 4.2 4.2 -- --     Elliptical   Level -- -- 1 1  10  min 1   Speed -- -- 2.5 2.5 4.3   Minutes -- -- 15 15 10      REL-XR   Level -- -- 5 5 --   Minutes -- -- 15 15 --   METs -- -- 5.6 7.1 --     T5 Nustep   Level -- 2 2 3 3    Minutes -- 15 15 15 15    METs -- -- -- 3.1 3.1     Oxygen   Maintain Oxygen Saturation -- 88% or higher 88% or higher 88% or higher 88% or higher    Row Name 07/03/23 0900 07/16/23 1100 07/31/23 1100 07/31/23 1700 08/12/23 1300     Response to Exercise   Blood Pressure (Admit) 122/62 126/64 -- 118/62 118/62   Blood Pressure (Exit) 110/62 110/60 -- 114/60 122/60   Heart Rate (Admit) 103 bpm 117 bpm -- 96 bpm 109 bpm   Heart Rate (Exercise) 152 bpm 140 bpm -- 137 bpm 139 bpm   Heart Rate (Exit) 115 bpm 116 bpm -- 115 bpm 117 bpm   Oxygen Saturation (Admit) 97 % 90 % -- 98 % 98 %   Oxygen Saturation (Exercise) 92 % 94 % -- 89 % 88 %   Oxygen Saturation (Exit) 97 % 97 % -- 98 % 98 %   Rating of Perceived Exertion (Exercise) 15 12 -- 15 13   Perceived Dyspnea (Exercise) 1 1 -- 0 0   Symptoms none none -- none none   Duration Continue with 30 min of aerobic exercise without signs/symptoms of physical distress. Continue with 30 min of aerobic exercise without signs/symptoms of physical distress. -- Continue with 30 min of aerobic exercise without signs/symptoms of physical distress. Continue with 30 min of aerobic exercise without signs/symptoms of physical distress.   Intensity THRR unchanged THRR unchanged -- THRR unchanged THRR unchanged  Progression   Progression Continue to progress workloads to maintain intensity without signs/symptoms of physical distress. Continue to progress workloads to maintain intensity without signs/symptoms of physical distress. -- Continue to progress workloads to maintain intensity  without signs/symptoms of physical distress. Continue to progress workloads to maintain intensity without signs/symptoms of physical distress.   Average METs 3.48 3.5 -- 3.6 3.61     Resistance Training   Training Prescription Yes Yes -- Yes Yes   Weight 5 lb 5 lb -- 5 lb 5 lb   Reps 10-15 10-15 -- 10-15 10-15     Interval Training   Interval Training No No -- No No     Treadmill   MPH 3 3 -- 3 3   Grade 0.5 0.5 -- 1 1   Minutes 15 15 -- 15 15   METs 3.5 3.5 -- 3.71 3.71     NuStep   Level 3 6 -- -- --   Minutes 15 15 -- -- --   METs 3.1 -- -- -- --     Elliptical   Level 1 -- -- 1 1   Speed 4.4 -- -- 4.4 3.7   Minutes 15 -- -- 15 15     REL-XR   Level 5 -- -- 6 7   Minutes 15 -- -- 15 15     T5 Nustep   Level -- 4 -- -- --   Minutes -- 15 -- -- --   METs -- 3.6 -- -- --     Biostep-RELP   Level 1 -- -- -- --   Minutes 15 -- -- -- --   METs 4 -- -- -- --     Home Exercise Plan   Plans to continue exercise at -- -- [employer] (comment)  [employer], water aerobics, aerobic machines, weight machines and [employer] (comment)  [employer], water aerobics, aerobic machines, weight machines and Public [employer] (comment)  [employer], water aerobics, aerobic machines, weight machines and handweights   Frequency -- -- Add 2 additional days to program exercise sessions. Add 2 additional days to program exercise sessions. Add 2 additional days to program exercise sessions.   Initial Home Exercises Provided -- -- 07/31/23 07/31/23 07/31/23     Oxygen   Maintain Oxygen Saturation 88% or higher 88% or higher 88% or higher 88% or higher 88% or higher    Row Name 08/28/23 1400 09/10/23 1400 09/24/23 1100         Response to Exercise   Blood Pressure (Admit) 124/74 124/70 126/62     Blood Pressure (Exit) 118/64 118/78 102/58     Heart Rate (Admit) 91 bpm 97 bpm 109 bpm     Heart Rate (Exercise) 138 bpm 146 bpm 145 bpm     Heart Rate (Exit) 121 bpm 115  bpm 116 bpm     Oxygen Saturation (Admit) 97 % 96 % 99 %     Oxygen Saturation (Exercise) 91 % 93 % 88 %     Oxygen Saturation (Exit) 96 % 94 % 94 %     Rating of Perceived Exertion (Exercise) 15 13 14      Perceived Dyspnea (Exercise) 0 0 0     Symptoms none none none     Duration Continue with 30 min of aerobic exercise without signs/symptoms of physical distress. Continue with 30 min of aerobic exercise without signs/symptoms of physical distress. Continue with 30 min of aerobic exercise without signs/symptoms of physical distress.  Intensity THRR unchanged THRR unchanged THRR unchanged       Progression   Progression Continue to progress workloads to maintain intensity without signs/symptoms of physical distress. Continue to progress workloads to maintain intensity without signs/symptoms of physical distress. Continue to progress workloads to maintain intensity without signs/symptoms of physical distress.     Average METs 4.94 5.04 5.2       Resistance Training   Training Prescription Yes Yes Yes     Weight 5 lb 5 lb 5 lb     Reps 10-15 10-15 10-15       Interval Training   Interval Training No No No       Treadmill   MPH 3 3.3 3.2     Grade 1 1 2      Minutes 15 15 15      METs 3.71 3.98 4.33       Elliptical   Level 1 1 1      Speed 4 4.9 4.9     Minutes 15 15 15        REL-XR   Level 8 7 8      Minutes 15 15 15      METs 7.4 7.8 7.4       Home Exercise Plan   Plans to continue exercise at [employer] (comment)  [employer], water aerobics, aerobic machines, weight machines and Public house [employer] (comment)  [employer], water aerobics, aerobic machines, weight machines and Public house manager (comment)  [employer], water aerobics, aerobic machines, weight machines and handweights     Frequency Add 2 additional days to program exercise sessions. Add 2 additional days to program exercise sessions. Add 2 additional days to program exercise sessions.     Initial Home  Exercises Provided 07/31/23 07/31/23 07/31/23       Oxygen   Maintain Oxygen Saturation 88% or higher 88% or higher 88% or higher              Exercise Comments:   Exercise Goals and Review:   Exercise Goals     Row Name 04/28/23 1509             Exercise Goals   Increase Physical Activity Yes       Intervention Provide advice, education, support and counseling about physical activity/exercise needs.;Develop an individualized exercise prescription for aerobic and resistive training based on initial evaluation findings, risk stratification, comorbidities and participant's personal goals.       Expected Outcomes Short Term: Attend rehab on a regular basis to increase amount of physical activity.;Long Term: Add in home exercise to make exercise part of routine and to increase amount of physical activity.;Long Term: Exercising regularly at least 3-5 days a week.       Increase Strength and Stamina Yes       Intervention Provide advice, education, support and counseling about physical activity/exercise needs.;Develop an individualized exercise prescription for aerobic and resistive training based on initial evaluation findings, risk stratification, comorbidities and participant's personal goals.       Expected Outcomes Short Term: Increase workloads from initial exercise prescription for resistance, speed, and METs.;Short Term: Perform resistance training exercises routinely during rehab and add in resistance training at home;Long Term: Improve cardiorespiratory fitness, muscular endurance and strength as measured by increased METs and functional capacity ( )       Able to understand and use rate of perceived exertion (RPE) scale Yes       Intervention Provide education and explanation on how to use RPE scale  Expected Outcomes Short Term: Able to use RPE daily in rehab to express subjective intensity level;Long Term:  Able to use RPE to guide intensity level when exercising  independently       Able to understand and use Dyspnea scale Yes       Intervention Provide education and explanation on how to use Dyspnea scale       Expected Outcomes Short Term: Able to use Dyspnea scale daily in rehab to express subjective sense of shortness of breath during exertion;Long Term: Able to use Dyspnea scale to guide intensity level when exercising independently       Knowledge and understanding of Target Heart Rate Range (THRR) Yes       Intervention Provide education and explanation of THRR including how the numbers were predicted and where they are located for reference       Expected Outcomes Short Term: Able to state/look up THRR;Long Term: Able to use THRR to govern intensity when exercising independently;Short Term: Able to use daily as guideline for intensity in rehab       Able to check pulse independently Yes       Intervention Provide education and demonstration on how to check pulse in carotid and radial arteries.;Review the importance of being able to check your own pulse for safety during independent exercise       Expected Outcomes Short Term: Able to explain why pulse checking is important during independent exercise;Long Term: Able to check pulse independently and accurately       Understanding of Exercise Prescription Yes       Intervention Provide education, explanation, and written materials on patient's individual exercise prescription       Expected Outcomes Short Term: Able to explain program exercise prescription;Long Term: Able to explain home exercise prescription to exercise independently                Exercise Goals Re-Evaluation :  Exercise Goals Re-Evaluation     Row Name 05/08/23 1647 05/22/23 1712 06/05/23 1443 06/19/23 1343 06/24/23 1134     Exercise Goal Re-Evaluation   Exercise Goals Review Increase Physical Activity;Increase Strength and Stamina;Understanding of Exercise Prescription Increase Physical Activity;Increase Strength and  Stamina;Understanding of Exercise Prescription Increase Physical Activity;Increase Strength and Stamina;Understanding of Exercise Prescription Increase Physical Activity;Increase Strength and Stamina;Understanding of Exercise Prescription Increase Physical Activity   Comments Concepcion is off to a good start in the program. She attended 2 sessions during this review, and was able to use the treadmill, T4, and T5 nustep. We will continue to monitor her progress in the program. Dennie Bible continues to make improvements at rehab. She has recently increased her level on the XR from 2 to 5. She has also added 0.5% incline to her prescribed on the treadmill. We will continue to monitor her progress in the program. Dennie Bible continues to do well in rehab. She has been able to increase her hand weights from 4 to 5lb, and increase her level on the T5 nustep from level 2 to 3. She has also been able to maintain a consistent workload on the treadmill, and continues to try to use the elliptical for the full 20 minutes. We will continue to monitor her progress in the program. Dennie Bible continues to do well in rehab. She has recently been able to increase her speed on the elliptical from 2.5 to 4.3 mph. She has also been able to maintain a consistent intensity on the treadmill and T5 nustep. We will continue  to monitor her progress in the program. [facility] a part of this class for three years and really enjoys the challenge and community. She continues to increase her levels on her machines every few sessions.   Expected Outcomes Short: Continue to follow current exercise prescription. Long: Continue exercise to improve strength and stamina. Short: Continue to follow current exercise prescription. Long: Continue exercise to improve strength and stamina. Short: Continue to work to improve time on elliptical. Long: Continue exercise to improve strength and stamina. Short: Continue to  work to improve time on elliptical. Long: Continue exercise to improve strength and stamina. Short: continue to attend water aerobics and the program consistenlty. Long: independently manage exercise consistently.    Row Name 07/03/23 0913 07/16/23 1147 07/31/23 1131 07/31/23 1725 08/12/23 1305     Exercise Goal Re-Evaluation   Exercise Goals Review Increase Physical Activity;Increase Strength and Stamina;Knowledge and understanding of Target Heart Rate Range (THRR) Increase Physical Activity;Increase Strength and Stamina;Knowledge and understanding of Target Heart Rate Range (THRR) Increase Physical Activity;Increase Strength and Stamina;Knowledge and understanding of Target Heart Rate Range (THRR);Able to understand and use Dyspnea scale;Understanding of Exercise Prescription;Able to check pulse independently;Able to understand and use rate of perceived exertion (RPE) scale Increase Physical Activity;Increase Strength and Stamina;Understanding of Exercise Prescription Increase Physical Activity;Increase Strength and Stamina;Understanding of Exercise Prescription   Comments Dennie Bible is doing well in rehab. She has stayed consistent with her treadmill workload at a speed of 3 mph with an incline of 0.5%. She also was able to do the elliptical for 15 minutes after previously only being able to do 10 minutes. She has stayed consistent with her workloads on seated machines as well. We will continue to monitor her progress in the program. Dennie Bible continues to do well in rehab. She has been able to increase her level on the T4 nustep from level 3 to 6. She also increased her level on the T5 nustep from level 3 to 4. We will continue to monitor her progress in the program. Reviewed home exercise with pt today from 11:20 to 11:29.  Pt plans to go to the [facility] and attend water aerobics class 3 times a week and use aerobic machines 2 days a week for exercise.  Reviewed THR, pulse, RPE, sign and symptoms, pulse oximetery and when  to call 911 or MD.  Also discussed weather considerations and indoor options.  Pt voiced understanding. Dennie Bible is doing well in rehab. She was able to increase her level on the XR from level 5 to 6. She was also able to increase her grade on the treadmill from 0.5% to 1%. We will continue to monitor her progress in the program. Dennie Bible continues to do well in rehab. She has stayed consistent with her treadmill workload at a speed of 3 mph with an incline of 1%. She also continues to work at level 1 on the elliptical and improved to level 7 on the XR. We will continue to monitor her progress in the program.   Expected Outcomes Short: Continue to progressively increase treadmill workload. Long: Continue exercise to improve strength and stamina. Short: Continue to progressively increase treadmill workload. Long: Continue exercise to improve strength and stamina. Short: Continue to go to the [facility] on days away from rehab. Long: Continue to exercise independently. Short: Continue progressively increasing treadmill workloads. Long: Continue exercise to improve strength and stamina. Short: Continue to progressively increase treadmill workload.  Long: Continue exercise to improve strength and stamina.    Row Name 08/28/23 1424 09/02/23 1131 09/10/23 1419 09/24/23 1110       Exercise Goal Re-Evaluation   Exercise Goals Review Increase Physical Activity;Increase Strength and Stamina;Understanding of Exercise Prescription Able to understand and use Dyspnea scale;Knowledge and understanding of Target Heart Rate Range (THRR);Able to check pulse independently;Understanding of Exercise Prescription Increase Physical Activity;Increase Strength and Stamina;Understanding of Exercise Prescription Increase Physical Activity;Increase Strength and Stamina;Understanding of Exercise Prescription    Comments Dennie Bible is doing well in rehab. She has been able to increase her level on the XR from level 7 to 8. She was also able to maintain her  workload on both the treadmill and elliptical. We will continue to monitor her progress in the program. Dennie Bible continues to attend her water aerobics class regularly. She already has a plan in place [facility]. She has been increasing her levels on her machines and will continue to build on that after graduation. We discussed her HR and how she has been consistently in the 140s during exercise but then cools down. The importance of cooling down was discussed when she is on her own. She wears her watch that tells her her HR and feels comfortable checking her pulse as well. Dennie Bible continues to do well in rehab. She has been able to increase her speed on the treadmill from to 3.18mph. She was also able to increase her speed on the elliptical from 4 to 4. at level 1. We will continue to monitor her progress in the program. Dennie Bible is doing well in rehab. She was able to increase her incline on the treadmill from 1% to 2%. She was also able to return to level 8 on the XR from level 7. We will continue to monitor her progress in the program.    Expected Outcomes Short: Continue to progressively increase treadmill and elliptical workload. Long: Continue exercise to improve strength and stamina. Short: continue coming to the program until graduation. Long; independently manage exercise routine. Short: Prepare for her post-6MWT. Long: Continue exercise to increase strength and stamina. Short: Continue to increase treadmill workloads. Long: Continue exercise to increase strength and stamina.             Discharge Exercise Prescription (Final Exercise Prescription Changes):  Exercise Prescription Changes - 09/24/23 1100       Response to Exercise   Blood Pressure (Admit) 126/62    Blood Pressure (Exit) 102/58    Heart Rate (Admit) 109 bpm    Heart Rate (Exercise) 145 bpm    Heart Rate (Exit) 116 bpm    Oxygen Saturation (Admit) 99 %    Oxygen Saturation (Exercise) 88 %     Oxygen Saturation (Exit) 94 %    Rating of Perceived Exertion (Exercise) 14    Perceived Dyspnea (Exercise) 0    Symptoms none    Duration Continue with 30 min of aerobic exercise without signs/symptoms of physical distress.    Intensity THRR unchanged      Progression   Progression Continue to progress workloads to maintain intensity without signs/symptoms of physical distress.    Average METs 5.2      Resistance Training   Training Prescription Yes    Weight 5 lb    Reps 10-15      Interval Training   Interval Training No      Treadmill   MPH 3.2  Grade 2    Minutes 15    METs 4.33      Elliptical   Level 1    Speed 4.9    Minutes 15      REL-XR   Level 8    Minutes 15    METs 7.4      Home Exercise Plan   Plans to continue exercise at [employer] (comment)   [employer], water aerobics, aerobic machines, weight machines and handweights   Frequency Add 2 additional days to program exercise sessions.    Initial Home Exercises Provided 07/31/23      Oxygen   Maintain Oxygen Saturation 88% or higher             Nutrition:  Target Goals: Understanding of nutrition guidelines, daily intake of sodium 1500mg , cholesterol 200mg , calories 30% from fat and 7% or less from saturated fats, daily to have 5 or more servings of fruits and vegetables.  Education: All About Nutrition: -Group instruction provided by verbal, written material, interactive activities, discussions, models, and posters to present general guidelines for heart healthy nutrition including fat, fiber, MyPlate, the role of sodium in heart healthy nutrition, utilization of the nutrition label, and utilization of this knowledge for meal planning. Follow up email sent as well. Written material given at graduation. Flowsheet Row Pulmonary Rehab from 07/03/2023 in [facility]  Education need identified 04/28/23  Date 07/03/23  Educator JG  Instruction Review Code 1- Verbalizes  Understanding       Biometrics:  Pre Biometrics - 04/28/23 1511       Pre Biometrics   Height 6' (1.829 m)    Weight 210 lb (95.3 kg)    Waist Circumference 36 inches    Hip Circumference 47 inches    Waist to Hip Ratio 0.77 %    BMI (Calculated) 28.47    Single Leg Stand 9.43 seconds             Post Biometrics - 09/23/23 1209        Post  Biometrics   Height 6' (1.829 m)    Weight 198 lb 4.8 oz (89.9 kg)    Waist Circumference 32 inches    Hip Circumference 43 inches    Waist to Hip Ratio 0.74 %    BMI (Calculated) 26.89    Single Leg Stand 30 seconds             Nutrition Therapy Plan and Nutrition Goals:  Nutrition Therapy & Goals - 04/28/23 1511       Nutrition Therapy   Diet Cardiac, Low Na    Protein (specify units) 90    Fiber 25 grams    Whole Grain Foods 3 servings    Saturated Fats 15 max. grams    Fruits and Vegetables 5 servings/day    Sodium 2 grams      Personal Nutrition Goals   Nutrition Goal Eat a small but nutrient dense breakfast most days the week    Personal Goal #2 Eat a protein and carb at each meal    Personal Goal #3 Cut back on sweets, look for healthier snacks    Comments Patient drinking water and coffee only. She reports she often skips lunch due to eating breakfast late, around 11am. She reports she wakes up early and goes to water aerobics without eating breakfast. Brainstormed things to make breakfast more achievable for her, focus was on smaller portions, quick to make, grab and go friendly.  Educated on what makes a good meal and snack. Set goal to pair protein and carbs together at each meal. She finds herself snacking on sweets often, talked about better snacks to try more often, reducing the frequency of these sugary foods. Reviewed mediterranean diet handout, types of fats, sources, how to read label. Built out several meals and snacks with foods she likes and will eat. Focus on smaller portions, healthy fats and reducing  sugar intake from poor food choices. She is very optimistic about making changes to her eating structure and will work on consistency.      Intervention Plan   Intervention Prescribe, educate and counsel regarding individualized specific dietary modifications aiming towards targeted core components such as weight, hypertension, lipid management, diabetes, heart failure and other comorbidities.;Nutrition handout(s) given to patient.    Expected Outcomes Long Term Goal: Adherence to prescribed nutrition plan.;Short Term Goal: A plan has been developed with personal nutrition goals set during dietitian appointment.;Short Term Goal: Understand basic principles of dietary content, such as calories, fat, sodium, cholesterol and nutrients.             Nutrition Assessments:  MEDIFICTS Score Key: >=70 Need to make dietary changes  40-70 Heart Healthy Diet <= 40 Therapeutic Level Cholesterol Diet  Flowsheet Row Pulmonary Rehab from 04/28/2023 in [facility]  Picture Your Plate Total Score on Admission 48      Picture Your Plate Scores: <16 Unhealthy dietary pattern with much room for improvement. 41-50 Dietary pattern unlikely to meet recommendations for good health and room for improvement. 51-60 More healthful dietary pattern, with some room for improvement.  >60 Healthy dietary pattern, although there may be some specific behaviors that could be improved.   Nutrition Goals Re-Evaluation:  Nutrition Goals Re-Evaluation     Row Name 06/05/23 1124 06/24/23 1131 09/02/23 1128         Goals   Current Weight 256 lb (116.1 kg) -- --     Comment Patient was informed on why it is important to maintain a balanced diet when dealing with Respiratory issues. Explained that it takes a lot of energy to breath and when they are short of breath often they will need to have a good diet to help keep up with the calories they are expending for breathing. Dennie Bible reports that she has been  trying to incoporate ideas from her meeting with the RD. She states she has cravings for sugar and carbs but she has just not been buying her typical snacks and she is strengthening her self will to resist over eating. Dennie Bible states her appetite is still very good...sometimes too good. However, she is more mindful of what she is eating and her activity levels. She is still not buying her typical sugary snacks regularly. She tries to remind herself to eat something before her water aerobics class by buying easy grab and go items.     Expected Outcome Short: Choose and plan snacks accordingly to patients caloric intake to improve breathing. Long: Maintain a diet independently that meets their caloric intake to aid in daily shortness of breath. Short: continue to incoporate healthy snacks into her day to day planning. Long: independenty manage healthy eating. Short: eat something in the morning consistently. Long; manage healthy eating habits consistently              Nutrition Goals Discharge (Final Nutrition Goals Re-Evaluation):  Nutrition Goals Re-Evaluation - 09/02/23 1128       Goals  Comment Dennie Bible states her appetite is still very good...sometimes too good. However, she is more mindful of what she is eating and her activity levels. She is still not buying her typical sugary snacks regularly. She tries to remind herself to eat something before her water aerobics class by buying easy grab and go items.    Expected Outcome Short: eat something in the morning consistently. Long; manage healthy eating habits consistently             Psychosocial: Target Goals: Acknowledge presence or absence of significant depression and/or stress, maximize coping skills, provide positive support system. Participant is able to verbalize types and ability to use techniques and skills needed for reducing stress and depression.   Education: Stress, Anxiety, and Depression - Group verbal and visual presentation to  define topics covered.  Reviews how body is impacted by stress, anxiety, and depression.  Also discusses healthy ways to reduce stress and to treat/manage anxiety and depression.  Written material given at graduation. Flowsheet Row Pulmonary Rehab from 07/03/2023 in [facility] Pulmonary Re[facility]  Date 05/29/23  Educator SB  Instruction Review Code 1- [employer] Understanding       Education: Sleep Hygiene -Provides group verbal and written instruction about how sleep can affect your health.  Define sleep hygiene, discuss sleep cycles and impact of sleep habits. Review good sleep hygiene tips.    Initial Review & Psychosocial Screening:  Initial Psych Review & Screening - 04/14/23 1020       Initial Review   Current issues with None Identified;Current Anxiety/Panic;History of Depression      Family Dynamics   Good Support System? Yes    Comments SHe has a good support system. She can look to her sons for support and her youngest son lives with her. Sometiems she has anxiet over the normal stresses of life but nothing significant.      Barriers   Psychosocial barriers to participate in program The patient should benefit from training in stress management and relaxation.;There are no identifiable barriers or psychosocial needs.      Screening Interventions   Interventions Encouraged to exercise;To provide support and resources with identified psychosocial needs;Provide feedback about the scores to participant    Expected Outcomes Short Term goal: Utilizing psychosocial counselor, staff and physician to assist with identification of specific Stressors or current issues interfering with healing process. Setting desired goal for each stressor or current issue identified.;Long Term Goal: Stressors or current issues are controlled or eliminated.;Short Term goal: Identification and review with participant of any Quality of Life or Depression concerns found by scoring the questionnaire.;Long  Term goal: The participant improves quality of Life and PHQ9 Scores as seen by post scores and/or verbalization of changes             Quality of Life Scores:  Scores of 19 and below usually indicate a poorer quality of life in these areas.  A difference of  2-3 points is a clinically meaningful difference.  A difference of 2-3 points in the total score of the Quality of Life Index has been associated with significant improvement in overall quality of life, self-image, physical symptoms, and general health in studies assessing change in quality of life.  PHQ-9: Review Flowsheet  More data exists      08/27/2023 04/28/2023 03/25/2023 03/14/2023 11/19/2022  Depression screen PHQ 2/9  Decreased Interest 0 0 0 0 0  Down, Depressed, Hopeless 0 0 0 0 0  PHQ - 2 Score  0 0 0 0 0  Altered sleeping 0 0 0 0 0  Tired, decreased energy 0 1 0 0 1  Change in appetite 0 0 0 0 0  Feeling bad or failure about yourself  0 0 0 0 0  Trouble concentrating 0 1 0 0 0  Moving slowly or fidgety/restless 0 0 0 0 0  Suicidal thoughts 0 0 0 0 0  PHQ-9 Score 0 2 0 0 1  Difficult doing work/chores Not difficult at all Not difficult at all - - -   Interpretation of Total Score  Total Score Depression Severity:  1-4 = Minimal depression, 5-9 = Mild depression, 10-14 = Moderate depression, 15-19 = Moderately severe depression, 20-27 = Severe depression   Psychosocial Evaluation and Intervention:  Psychosocial Evaluation - 04/14/23 1022       Psychosocial Evaluation & Interventions   Interventions Encouraged to exercise with the program and follow exercise prescription;Relaxation education;Stress management education    Comments SHe has a good support system. She can look to her sons for support and her youngest son lives with her. Sometiems she has anxiet over the normal stresses of life but nothing significant.    Expected Outcomes Short: Start [facility] to help with mood. Long: Maintain a healthy mental state.     Continue Psychosocial Services  Follow up required by staff             Psychosocial Re-Evaluation:  Psychosocial Re-Evaluation     Row Name 06/05/23 1126 06/24/23 1123 09/02/23 1123         Psychosocial Re-Evaluation   Current issues with Current Psychotropic Meds;Current Anxiety/Panic;History of Depression Current Psychotropic Meds;Current Anxiety/Panic;History of Depression Current Psychotropic Meds;Current Anxiety/Panic;History of Depression     Comments Dennie Bible has some anxiety and takes medication for it. In the last few weeks she has not had any mood changes. She reports no issues with their current mental states, sleep, stress, depression or anxiety. Will follow up with patient in a few weeks for any changes. Dennie Bible is meeting with her therapist regularly to help manage her depression and anxiety. She states she has had two family members that have caused an increase in stress and she thinks that they caused a lot of issues related to her blood pressure. Now that they both are no longer involved in her life, she notes that her pressure has been much better and they have taken her off her blood pressure medications. Her therapist has helped her talk through the issues with her sister that is one of the family members that have caused her stress. They were supposed to go on a cruise together in a few weeks, but her sister decided not to go. Dennie Bible decided to continue with the cruise plans and this is her first solo time traveling. She is really excited about her trip and her kids are proud of her for planning on going. She continues to attend her water aerobics class that she has been a part of for the last three years and has talked to another patient about joining her. She really enjoys the community of friends and how it makes her feel. Dennie Bible wants to continue to take care of her health because she said she had kids later in life and wants to be healthy when she has grandkids Dennie Bible continues to meet with  her therapist every two weeks. She has been doing that for years and plans to continue. The stressful situation with her sister has calmed down  some. Her cruise went really well and she was very proud of herself for braving it on her own. She has planned another cruise with her neice in a couple weeks. They want her to go ziplining and sing karoke, which is out of character for her she says but she is excited to try. She feels this program has helped her feel more confident and strong to try new things like that. Her family also has a big cruise planned for this summer that she looking forward to. She states she already has a game plan to stay [facility] and [facility] her [facility]obics. She feels at peace with her life currently and plans to keep incorporating positive health habits     Expected Outcomes Short: Continue to exercise regularly to support mental health and notify staff of any changes. Long: maintain mental health and well being through teaching of rehab or prescribed medications independently. Short: continue to attend the program to increase her stamina and positively influence her mental health. Long; maintain positive self care habits. Short: attend the program until graduation to help solidify positive health habits. Long: maintain mental health habits     Interventions Encouraged to attend Pulmonary Rehabilitation for the exercise Encouraged to attend Pulmonary Rehabilitation for the exercise Encouraged to attend Pulmonary Rehabilitation for the exercise     Continue Psychosocial Services  Follow up required by staff Follow up required by staff --              Psychosocial Discharge (Final Psychosocial Re-Evaluation):  Psychosocial Re-Evaluation - 09/02/23 1123       Psychosocial Re-Evaluation   Current issues with Current Psychotropic Meds;Current Anxiety/Panic;History of Depression    Comments Dennie Bible continues to meet with her therapist every two weeks.  She has been doing that for years and plans to continue. The stressful situation with her sister has calmed down some. Her cruise went really well and she was very proud of herself for braving it on her own. She has planned another cruise with her neice in a couple weeks. They want her to go ziplining and sing karoke, which is out of character for her she says but she is excited to try. She feels this program has helped her feel more confident and strong to try new things like that. Her family also has a big cruise planned for this summer that she looking forward to. She states she already has a game plan to stay active with [facility] and continuing her water aerobics. She feels at peace with her life currently and plans to keep incorporating positive health habits    Expected Outcomes Short: attend the program until graduation to help solidify positive health habits. Long: maintain mental health habits    Interventions Encouraged to attend Pulmonary Rehabilitation for the exercise             Education: Education Goals: Education classes will be provided on a weekly basis, covering required topics. Participant will state understanding/return demonstration of topics presented.  Learning Barriers/Preferences:  Learning Barriers/Preferences - 04/14/23 1018       Learning Barriers/Preferences   Learning Barriers None    Learning Preferences None             General Pulmonary Education Topics:  Infection Prevention: - Provides verbal and written material to individual with discussion of infection control including proper hand washing and proper equipment cleaning during exercise session. Flowsheet Row Pulmonary  Rehab from 07/03/2023 in [facility] Cardiac and Pulmonary Rehab  Date 04/28/23  Educator [facility]  Instruction Review Code 1- Verbalizes Understanding       Falls Prevention: - Provides verbal and written material to individual with discussion of falls prevention and  safety. Flowsheet Row Pulmonary Rehab from 07/03/2023 in [facility] Cardiac and Pulmonary Rehab  Date 04/28/23  Educator [facility]  Instruction Review Code 1- Verbalizes Understanding       Chronic Lung Disease Review: - Group verbal instruction with posters, models, PowerPoint presentations and videos,  to review new updates, new respiratory medications, new advancements in procedures and treatments. Providing information on websites and "800" numbers for continued self-education. Includes information about supplement oxygen, available portable oxygen systems, continuous and intermittent flow rates, oxygen safety, concentrators, and Medicare reimbursement for oxygen. Explanation of Pulmonary Drugs, including class, frequency, complications, importance of spacers, rinsing mouth after steroid MDI's, and proper cleaning methods for nebulizers. Review of basic lung anatomy and physiology related to function, structure, and complications of lung disease. Review of risk factors. Discussion about methods for diagnosing sleep apnea and types of masks and machines for OSA. Includes a review of the use of types of environmental controls: home humidity, furnaces, filters, dust mite/pet prevention, HEPA vacuums. Discussion about weather changes, air quality and the benefits of nasal washing. Instruction on Warning signs, infection symptoms, calling MD promptly, preventive modes, and value of vaccinations. Review of effective airway clearance, coughing and/or vibration techniques. Emphasizing that all should Create an Action Plan. Written material given at graduation. Flowsheet Row Pulmonary Rehab from 07/03/2023 in [facility] Cardiac and Pulmonary Rehab  Education need identified 04/28/23  Date 05/15/23  Educator [facility]  Instruction Review Code 1- Verbalizes Understanding       AED/CPR: - Group verbal and written instruction with the use of models to demonstrate the basic use of the AED with the basic ABC's of  resuscitation.    Anatomy and Cardiac Procedures: - Group verbal and visual presentation and models provide information about basic cardiac anatomy and function. Reviews the testing methods done to diagnose heart disease and the outcomes of the test results. Describes the treatment choices: Medical Management, Angioplasty, or Coronary Bypass Surgery for treating various heart conditions including Myocardial Infarction, Angina, Valve Disease, and Cardiac Arrhythmias.  Written material given at graduation.   Medication Safety: - Group verbal and visual instruction to review commonly prescribed medications for heart and lung disease. Reviews the medication, class of the drug, and side effects. Includes the steps to properly store meds and maintain the prescription regimen.  Written material given at graduation.   Other: -Provides group and verbal instruction on various topics (see comments)   Knowledge Questionnaire Score:  Knowledge Questionnaire Score - 04/28/23 1513       Knowledge Questionnaire Score   Pre Score 11/18              Core Components/Risk Factors/Patient Goals at Admission:  Personal Goals and Risk Factors at Admission - 04/28/23 1512       Core Components/Risk Factors/Patient Goals on Admission    Weight Management Yes;Weight Loss    Intervention Weight Management/Obesity: Establish reasonable short term and long term weight goals.;Weight Management: Provide education and appropriate resources to help participant work on and attain dietary goals.;Weight Management: Develop a combined nutrition and exercise program designed to reach desired caloric intake, while maintaining appropriate intake of nutrient and fiber, sodium and fats, and appropriate energy expenditure required for the weight goal.  Admit Weight 210 lb (95.3 kg)    Goal Weight: Short Term 200 lb (90.7 kg)    Goal Weight: Long Term 180 lb (81.6 kg)    Expected Outcomes Short Term: Continue to assess  and modify interventions until short term weight is achieved;Long Term: Adherence to nutrition and physical activity/exercise program aimed toward attainment of established weight goal;Weight Loss: Understanding of general recommendations for a balanced deficit meal plan, which promotes 1-2 lb weight loss per week and includes a negative energy balance of [phone] kcal/d;Understanding recommendations for meals to include 15-35% energy as protein, 25-35% energy from fat, 35-60% energy from carbohydrates, less than 200mg  of dietary cholesterol, 20-35 gm of total fiber daily;Understanding of distribution of calorie intake throughout the day with the consumption of 4-5 meals/snacks    Improve shortness of breath with ADL's Yes    Intervention Provide education, individualized exercise plan and daily activity instruction to help decrease symptoms of SOB with activities of daily living.    Expected Outcomes Short Term: Improve cardiorespiratory fitness to achieve a reduction of symptoms when performing ADLs;Long Term: Be able to perform more ADLs without symptoms or delay the onset of symptoms    Hypertension Yes    Intervention Provide education on lifestyle modifcations including regular physical activity/exercise, weight management, moderate sodium restriction and increased consumption of fresh fruit, vegetables, and low fat dairy, alcohol moderation, and smoking cessation.;Monitor prescription use compliance.    Expected Outcomes Short Term: Continued assessment and intervention until BP is < 140/69mm HG in hypertensive participants. < 130/62mm HG in hypertensive participants with diabetes, heart failure or chronic kidney disease.;Long Term: Maintenance of blood pressure at goal levels.    Lipids Yes    Intervention Provide education and support for participant on nutrition & aerobic/resistive exercise along with prescribed medications to achieve LDL 70mg , HDL >40mg .    Expected Outcomes Short Term:  Participant states understanding of desired cholesterol values and is compliant with medications prescribed. Participant is following exercise prescription and nutrition guidelines.;Long Term: Cholesterol controlled with medications as prescribed, with individualized exercise RX and with personalized nutrition plan. Value goals: LDL < 70mg , HDL > 40 mg.             Education:Diabetes - Individual verbal and written instruction to review signs/symptoms of diabetes, desired ranges of glucose level fasting, after meals and with exercise. Acknowledge that pre and post exercise glucose checks will be done for 3 sessions at entry of program.   Know Your Numbers and Heart Failure: - Group verbal and visual instruction to discuss disease risk factors for cardiac and pulmonary disease and treatment options.  Reviews associated critical values for Overweight/Obesity, Hypertension, Cholesterol, and Diabetes.  Discusses basics of heart failure: signs/symptoms and treatments.  Introduces Heart Failure Zone chart for action plan for heart failure.  Written material given at graduation. Flowsheet Row Pulmonary Rehab from 07/03/2023 in [facility] Cardiac and Pulmonary Rehab  Education need identified 04/28/23  Date 05/22/23  Educator [facility]  Instruction Review Code 1- Verbalizes Understanding       Core Components/Risk Factors/Patient Goals Review:   Goals and Risk Factor Review     Row Name 06/05/23 1123 06/24/23 1120 09/02/23 1119         Core Components/Risk Factors/Patient Goals Review   Personal Goals Review Improve shortness of breath with ADL's Hypertension;Improve shortness of breath with ADL's Hypertension;Lipids;Improve shortness of breath with ADL's     Review Spoke to patient about their shortness of breath and what they  can do to improve. Patient has been informed of breathing techniques when starting the program. Patient is informed to tell staff if they have had any med changes and that certain  meds they are taking or not taking can be causing shortness of breath. Pat's blood pressure medication was stopped. She notes that her pressures have been stable since then. She thinks a lot of her blood pressure issues were coming from family stressors and now that those have been removed, her blood pressure has been more stable. She is encouraged to check her blood pressure regularly with her home blood pressure monitor. She has been working on her breathing techniques while in the program and has noted some increase her stamina. She is hopeful that coming regularly and continuing to do her water aerobics will help keep her motivated to keep up her hard work. Dennie Bible saw her doctor last week and received a great report. She can stay off her blood pressure medications. She has been checking her bp at home and it has been stable. Her lipids are within the limits her doctor wants. She notes her shortness of breathing has improved and practices deep breathing when needed. She has about 10 more sessions left in the program and wants to keep improving on her risk factor management     Expected Outcomes Short: Attend [facility] regularly to improve shortness of breath with ADL's. Long: maintain independence with ADL's Short: attend the program consistently to work on her blood pressure managment and breathing techniques. Long: independently manage her risk factors Short: graduate from the program with confidence in managing her risk factors. Long: independently manage her risk factors.              Core Components/Risk Factors/Patient Goals at Discharge (Final Review):   Goals and Risk Factor Review - 09/02/23 1119       Core Components/Risk Factors/Patient Goals Review   Personal Goals Review Hypertension;Lipids;Improve shortness of breath with ADL's    Review Dennie Bible saw her doctor last week and received a great report. She can stay off her blood pressure medications. She has been checking her bp at home and it  has been stable. Her lipids are within the limits her doctor wants. She notes her shortness of breathing has improved and practices deep breathing when needed. She has about 10 more sessions left in the program and wants to keep improving on her risk factor management    Expected Outcomes Short: graduate from the program with confidence in managing her risk factors. Long: independently manage her risk factors.             ITP Comments:  ITP Comments     Row Name 04/14/23 1016 04/28/23 1458 05/21/23 0952 06/18/23 0838 07/09/23 1128   ITP Comments Virtual Visit completed. Patient informed on EP and RD appointment and 6 Minute walk test. Patient also informed of patient health questionnaires on My Chart. Patient Verbalizes understanding. Visit diagnosis can be found in [facility] 09/30/2022. Completed and gym orientation. Initial ITP created and sent for review to Dr. Jinny Sanders, Medical Director. 30 Day review completed. Medical Director ITP review done, changes made as directed, and signed approval by Medical Director.    new to program 30 Day review completed. Medical Director ITP review done, changes made as directed, and signed approval by Medical Director. 30 Day review completed. Medical Director ITP review done, changes made as directed, and signed approval by Medical Director.    Row Name 08/06/23 [phone]  09/03/23 1252 10/01/23 0751       ITP Comments 30 Day review completed. Medical Director ITP review done, changes made as directed, and signed approval by Medical Director. 30 Day review completed. Medical Director ITP review done, changes made as directed, and signed approval by Medical Director. 30 Day review completed. Medical Director ITP review done, changes made as directed, and signed approval by Medical Director.              Comments:

## 2023-10-02 ENCOUNTER — Encounter: Payer: Medicare Other | Admitting: *Deleted

## 2023-10-02 DIAGNOSIS — D860 Sarcoidosis of lung: Secondary | ICD-10-CM

## 2023-10-02 NOTE — Progress Notes (Signed)
Daily Session Note  Patient Details  Name: Melissa [name] MRN: [id number] Date of Birth: 1950-09-28 Referring Provider:   Flowsheet Row Pulmonary Rehab from 04/28/2023 in [facility] Cardiac and Pulmonary Rehab  Referring Provider Dr. Belia Heman       Encounter Date: 10/02/2023  Check In:  Session Check In - 10/02/23 1118       Check-In   Supervising physician immediately available to respond to emergencies See telemetry face sheet for immediately available ER MD    Location [facility]-Cardiac & Pulmonary Rehab    Staff Present Cora Collum, RN, BSN, CCRP;Joseph Hood RCP,RRT,BSRT;Noah Tickle, [place], Exercise Physiologist;Meredith Jewel Baize RN,BSN    Virtual Visit No    Medication changes reported     No    Fall or balance concerns reported    No    Warm-up and Cool-down Performed on first and last piece of equipment    Resistance Training Performed Yes    VAD Patient? No    PAD/SET Patient? No      Pain Assessment   Currently in Pain? No/denies                Social History   Tobacco Use  Smoking Status Never  Smokeless Tobacco Never  Tobacco Comments   Second hand smoke exposure    Goals Met:  Proper associated with RPD/PD & O2 Sat Independence with exercise equipment Exercise tolerated well No report of concerns or symptoms today  Goals Unmet:  Not Applicable  Comments: Pt able to follow exercise prescription today without complaint.  Will continue to monitor for progression.    Dr. Bethann Punches is Medical Director for [facility] Cardiac Rehabilitation.  Dr. Vida Rigger is Medical Director for [facility] Pulmonary Rehabilitation.

## 2023-10-07 ENCOUNTER — Encounter: Payer: Medicare Other | Admitting: *Deleted

## 2023-10-07 DIAGNOSIS — D860 Sarcoidosis of lung: Secondary | ICD-10-CM | POA: Diagnosis not present

## 2023-10-07 NOTE — Progress Notes (Signed)
 Daily Session Note  Patient Details  Name: Melissa [name] MRN: [id number] Date of Birth: Aug 29, 1950 Referring Provider:   Flowsheet Row Pulmonary Rehab from 04/28/2023 in [facility] Cardiac and Pulmonary Rehab  Referring Provider Dr. Belia Heman       Encounter Date: 10/07/2023  Check In:  Session Check In - 10/07/23 1124       Check-In   Supervising physician immediately available to respond to emergencies See telemetry face sheet for immediately available ER MD    Location [facility]-Cardiac & Pulmonary Rehab    Staff Present Cora Collum, RN, BSN, CCRP;Meredith Jewel Baize RN,BSN;Noah Tickle, BS, Exercise Physiologist;Maxon Conetta BS, Exercise Physiologist;Margaret Best, MS, Exercise Physiologist;Joseph Reino Kent RCP,RRT,BSRT    Virtual Visit No    Medication changes reported     No    Fall or balance concerns reported    No    Warm-up and Cool-down Performed on first and last piece of equipment    Resistance Training Performed Yes    VAD Patient? No    PAD/SET Patient? No      Pain Assessment   Currently in Pain? No/denies                Social History   Tobacco Use  Smoking Status Never  Smokeless Tobacco Never  Tobacco Comments   Second hand smoke exposure    Goals Met:  Proper associated with RPD/PD & O2 Sat Independence with exercise equipment Exercise tolerated well No report of concerns or symptoms today  Goals Unmet:  Not Applicable  Comments: Pt able to follow exercise prescription today without complaint.  Will continue to monitor for progression.    Dr. Bethann Punches is Medical Director for [facility] Cardiac Rehabilitation.  Dr. Vida Rigger is Medical Director for [facility] Pulmonary Rehabilitation.

## 2023-10-08 DIAGNOSIS — F411 Generalized anxiety disorder: Secondary | ICD-10-CM | POA: Diagnosis not present

## 2023-10-14 ENCOUNTER — Encounter: Payer: Medicare Other | Admitting: *Deleted

## 2023-10-14 DIAGNOSIS — D860 Sarcoidosis of lung: Secondary | ICD-10-CM

## 2023-10-14 DIAGNOSIS — Z96651 Presence of right artificial knee joint: Secondary | ICD-10-CM | POA: Diagnosis not present

## 2023-10-14 DIAGNOSIS — M7061 Trochanteric bursitis, right hip: Secondary | ICD-10-CM | POA: Diagnosis not present

## 2023-10-14 NOTE — Progress Notes (Signed)
 Pulmonary Individual Treatment Plan  Patient Details  Name: Melissa [name] MRN: [id number] Date of Birth: 09-Aug-1951 Referring Provider:   Flowsheet Row Pulmonary Rehab from 04/28/2023 in [facility] Cardiac and Pulmonary Rehab  Referring Provider Dr. Belia Heman       Initial Encounter Date:  Flowsheet Row Pulmonary Rehab from 04/28/2023 in [facility] Cardiac and Pulmonary Rehab  Date 04/28/23       Visit Diagnosis: Sarcoidosis of lung (HCC)  Patient's Home Medications on Admission:  Current Outpatient Medications:    acetaminophen (TYLENOL) 650 MG CR tablet, Take 1,300 mg by mouth every 8 (eight) hours as needed for pain., Disp: , Rfl:    albuterol (VENTOLIN HFA) 108 (90 Base) MCG/ACT inhaler, INHALE 1-2 PUFFS BY MOUTH EVERY 6 HOURS AS NEEDED FOR WHEEZE OR SHORTNESS OF BREATH, Disp: 18 each, Rfl: 0   Ascorbic Acid (VITAMIN C) 1000 MG tablet, Take 1,000 mg by mouth daily., Disp: , Rfl:    aspirin EC 81 MG tablet, Take 1 tablet by mouth daily., Disp: , Rfl:    atorvastatin (LIPITOR) 40 MG tablet, Take 1 tablet (40 mg total) by mouth daily., Disp: 90 tablet, Rfl: 1   azelastine (ASTELIN) 0.1 % nasal spray, Place 2 sprays into both nostrils 2 (two) times daily. Use in each nostril as directed, Disp: 30 mL, Rfl: 12   calcium carbonate (TUMS - DOSED IN MG ELEMENTAL CALCIUM) 500 MG chewable tablet, Chew 500-1,000 mg by mouth daily as needed for indigestion or heartburn., Disp: , Rfl:    Cholecalciferol (D 1000) 25 MCG (1000 UT) capsule, Take 1,000 Units by mouth daily., Disp: , Rfl:    ezetimibe (ZETIA) 10 MG tablet, TAKE 1 TABLET BY MOUTH EVERY DAY, Disp: 90 tablet, Rfl: 1   fluticasone (FLONASE) 50 MCG/ACT nasal spray, SPRAY 2 SPRAYS INTO EACH NOSTRIL EVERY DAY, Disp: 48 mL, Rfl: 1   Fluticasone-Umeclidin-Vilant (TRELEGY ELLIPTA) 200-62.5-25 MCG/ACT AEPB, INHALE ONE ACTUATION INTO THE LUNGS DAILY. Please schedule office visit before any future refills. (Patient not taking: Reported on 08/27/2023), Disp: 60  each, Rfl: 0   gabapentin (NEURONTIN) 100 MG capsule, Take 100-200 mg by mouth 2 (two) times daily. One in am and two at night, Disp: , Rfl:    JARDIANCE 10 MG TABS tablet, Take 10 mg by mouth daily., Disp: , Rfl:    levocetirizine (XYZAL ALLERGY 24HR) 5 MG tablet, Take 1 tablet (5 mg total) by mouth every evening., Disp: 30 tablet, Rfl: 6   magnesium oxide (MAG-OX) 400 MG tablet, Take 400 mg by mouth daily., Disp: , Rfl:    Multiple Vitamins-Minerals (CENTRUM SILVER) tablet, Take 1 tablet by mouth daily., Disp: 30 tablet, Rfl: 0   nortriptyline (PAMELOR) 10 MG capsule, Take 10-20 mg by mouth at bedtime., Disp: , Rfl:    Probiotic Product (FORTIFY PROBIOTIC WOMENS EX ST PO), Take by mouth daily., Disp: , Rfl:    propranolol (INDERAL) 10 MG tablet, Take 1 tablet (10 mg total) by mouth 3 (three) times daily as needed. For sustained fast heart rate, Disp: 60 tablet, Rfl: 3   zinc gluconate 50 MG tablet, Take 50 mg by mouth daily., Disp: , Rfl:   Past Medical History: Past Medical History:  Diagnosis Date   Aortic atherosclerosis (HCC)    Asthma    Bruising    Cardiomyopathy (HCC)    Celiac artery aneurysm (HCC)    Cervical radiculopathy    CKD (chronic kidney disease), stage III (HCC)    Deficiency of vitamin B  Depression    Edema    Flank pain    Gross hematuria    History of 2019 novel coronavirus disease (COVID-19) 05/01/2019   History of bariatric surgery    HLD (hyperlipidemia)    HTN (hypertension)    Interstitial lung disease (HCC)    Iron deficiency anemia    Leukopenia    OA (osteoarthritis)    Obesity    OSA on CPAP    Osteopenia    Perennial allergic rhinitis    Pneumonia due to COVID-19 virus 2020   Renal cysts, acquired, bilateral    RLS (restless legs syndrome)    Sarcoidosis    Sickle cell trait (HCC)    Snoring    Spinal stenosis    Trigeminal neuralgia     Tobacco Use: Social History   Tobacco Use  Smoking Status Never  Smokeless Tobacco Never   Tobacco Comments   Second hand smoke exposure    Labs: Review Flowsheet  More data exists      Latest Ref Rng & Units 10/18/2020 04/10/2021 04/10/2022 01/17/2023 01/18/2023  Labs for ITP Cardiac and Pulmonary Rehab  Cholestrol 0 - 200 mg/dL - 213  086  - 578   LDL (calc) 0 - 99 mg/dL - 94  57  - 54   HDL-C >40 mg/dL - 92  82  - 72   Trlycerides <150 mg/dL - 72  50  - 42   Hemoglobin A1c 4.8 - 5.6 % - - - 5.9  -  TCO2 22 - 32 mmol/L 25  - - - -     Pulmonary Assessment Scores:  Pulmonary Assessment Scores     Row Name 04/28/23 1515         ADL UCSD   ADL Phase Entry     SOB Score total 14     Rest 0     Walk 0     Stairs 2     Bath 0     Dress 1     Shop 2       CAT Score   CAT Score 6       mMRC Score   mMRC Score 1              UCSD: Self-administered rating of dyspnea associated with activities of daily living (ADLs) 6-point scale (0 = "not at all" to 5 = "maximal or unable to do because of breathlessness")  Scoring Scores range from 0 to 120.  Minimally important difference is 5 units  CAT: CAT can identify the health impairment of COPD patients and is better correlated with disease progression.  CAT has a scoring range of zero to 40. The CAT score is classified into four groups of low (less than 10), medium (10 - 20), high (21-30) and very high (31-40) based on the impact level of disease on health status. A CAT score over 10 suggests significant symptoms.  A worsening CAT score could be explained by an exacerbation, poor medication adherence, poor inhaler technique, or progression of COPD or comorbid conditions.  CAT MCID is 2 points  mMRC: mMRC (Modified Medical Research Council) Dyspnea Scale is used to assess the degree of baseline functional disability in patients of respiratory disease due to dyspnea. No minimal important difference is established. A decrease in score of 1 point or greater is considered a positive change.   Pulmonary Function  Assessment:   Exercise Target Goals: Exercise Program Goal: Individual exercise prescription set using results  from initial 6 min walk test and THRR while considering  patient's activity barriers and safety.   Exercise Prescription Goal: Initial exercise prescription builds to 30-45 minutes a day of aerobic activity, 2-3 days per week.  Home exercise guidelines will be given to patient during program as part of exercise prescription that the participant will acknowledge.  Education: Aerobic Exercise: - Group verbal and visual presentation on the components of exercise prescription. Introduces F.I.T.T principle from ACSM for exercise prescriptions.  Reviews F.I.T.T. principles of aerobic exercise including progression. Written material given at graduation. Flowsheet Row Pulmonary Rehab from 07/03/2023 in [facility] Cardiac and Pulmonary Rehab  Education need identified 04/28/23  Date 06/19/23  Educator MB  Instruction Review Code 1- [employer] Understanding       Education: Resistance Exercise: - Group verbal and visual presentation on the components of exercise prescription. Introduces F.I.T.T principle from ACSM for exercise prescriptions  Reviews F.I.T.T. principles of resistance exercise including progression. Written material given at graduation. Flowsheet Row Pulmonary Rehab from 07/03/2023 in [facility] Cardiac and Pulmonary Rehab  Date 06/12/23  Educator MB  Instruction Review Code 1- [employer] Understanding        Education: Exercise & Equipment Safety: - Individual verbal instruction and demonstration of equipment use and safety with use of the equipment. Flowsheet Row Pulmonary Rehab from 07/03/2023 in [facility] Cardiac and Pulmonary Rehab  Date 04/28/23  Educator [facility]  Instruction Review Code 1- Verbalizes Understanding       Education: Exercise Physiology & General Exercise Guidelines: - Group verbal and written instruction with models to review the exercise physiology of the  cardiovascular system and associated critical values. Provides general exercise guidelines with specific guidelines to those with heart or lung disease.  Flowsheet Row Pulmonary Rehab from 07/03/2023 in [facility] Cardiac and Pulmonary Rehab  Education need identified 04/28/23  Date 06/05/23  Educator MB  Instruction Review Code 1- [employer] Understanding       Education: Flexibility, Balance, Mind/Body Relaxation: - Group verbal and visual presentation with interactive activity on the components of exercise prescription. Introduces F.I.T.T principle from ACSM for exercise prescriptions. Reviews F.I.T.T. principles of flexibility and balance exercise training including progression. Also discusses the mind body connection.  Reviews various relaxation techniques to help reduce and manage stress (i.e. Deep breathing, progressive muscle relaxation, and visualization). Balance handout provided to take home. Written material given at graduation. Flowsheet Row Pulmonary Rehab from 07/03/2023 in [facility] Cardiac and Pulmonary Rehab  Date 06/12/23  Educator MB  Instruction Review Code 1- Verbalizes Understanding       Activity Barriers & Risk Stratification:  Activity Barriers & Cardiac Risk Stratification - 04/28/23 1502       Activity Barriers & Cardiac Risk Stratification   Activity Barriers Right Knee Replacement;Back Problems;Other (comment)    Comments left knee stiffness and some hip pain             6 Minute Walk:  6 Minute Walk     Row Name 04/28/23 1459 09/23/23 1204       6 Minute Walk   Phase Initial Discharge    Distance 1290 feet 1520 feet    Distance % Change -- 17.8 %    Distance Feet Change -- 230 ft    Walk Time 6 minutes 6 minutes    # of Rest Breaks 0 0    MPH 2.44 2.9    METS 3.45 3.45    RPE 13 9    Perceived Dyspnea  0 0  VO2 Peak 12.06 --    Symptoms No No    Resting HR 93 bpm 108 bpm    Resting BP 122/72 104/62    Resting Oxygen Saturation  95 % 95 %     Exercise Oxygen Saturation  during 6 min walk 93 % 92 %    Max Ex. HR 130 bpm 100 bpm    Max Ex. BP 158/80 128/66    2 Minute Post BP 118/62 112/64      Interval HR   1 Minute HR 114 100    2 Minute HR 117 --    3 Minute HR 118 --    4 Minute HR 120 --    5 Minute HR 130 --    6 Minute HR 127 100    2 Minute Post HR 101 99    Interval Heart Rate? Yes Yes      Interval Oxygen   Interval Oxygen? Yes Yes    Baseline Oxygen Saturation % 95 % 89 %    1 Minute Oxygen Saturation % 98 % --    1 Minute Liters of Oxygen 0 L --    2 Minute Oxygen Saturation % 93 % --    2 Minute Liters of Oxygen 0 L --    3 Minute Oxygen Saturation % 94 % 87 %    3 Minute Liters of Oxygen 0 L --    4 Minute Oxygen Saturation % 95 % 84 %    4 Minute Liters of Oxygen 0 L --    5 Minute Oxygen Saturation % 93 % 85 %    5 Minute Liters of Oxygen 0 L --    6 Minute Oxygen Saturation % 95 % 92 %    6 Minute Liters of Oxygen 0 L --    2 Minute Post Oxygen Saturation % 94 % 96 %    2 Minute Post Liters of Oxygen 0 L --            Oxygen Initial Assessment:  Oxygen Initial Assessment - 04/28/23 1514       Home Oxygen   Home Oxygen Device None    Sleep Oxygen Prescription CPAP    Liters per minute 0    Home Exercise Oxygen Prescription None    Home Resting Oxygen Prescription None    Compliance with Home Oxygen Use Yes      Initial 6 min Walk   Oxygen Used None      Program Oxygen Prescription   Program Oxygen Prescription None      Intervention   Short Term Goals To learn and exhibit compliance with exercise, home and travel O2 prescription;To learn and understand importance of monitoring SPO2 with pulse oximeter and demonstrate accurate use of the pulse oximeter.;To learn and understand importance of maintaining oxygen saturations>88%;To learn and demonstrate proper pursed lip breathing techniques or other breathing techniques. ;To learn and demonstrate proper use of respiratory medications     Long  Term Goals Exhibits compliance with exercise, home  and travel O2 prescription;Maintenance of O2 saturations>88%;Compliance with respiratory medication;Demonstrates proper use of MDI's;Exhibits proper breathing techniques, such as pursed lip breathing or other method taught during program session;Verbalizes importance of monitoring SPO2 with pulse oximeter and return demonstration             Oxygen Re-Evaluation:  Oxygen Re-Evaluation     Row Name 06/05/23 1122 06/24/23 1115 09/02/23 1135 09/25/23 1123       Program Oxygen  Prescription   Program Oxygen Prescription None None None None      Home Oxygen   Home Oxygen Device None None None None    Sleep Oxygen Prescription CPAP CPAP CPAP CPAP    Liters per minute 0 -- 0 0    Home Exercise Oxygen Prescription -- None None None    Home Resting Oxygen Prescription None None None None    Compliance with Home Oxygen Use Yes Yes Yes Yes      Goals/Expected Outcomes   Short Term Goals To learn and demonstrate proper pursed lip breathing techniques or other breathing techniques.  To learn and demonstrate proper pursed lip breathing techniques or other breathing techniques. ;To learn and demonstrate proper use of respiratory medications;To learn and understand importance of maintaining oxygen saturations>88% To learn and demonstrate proper pursed lip breathing techniques or other breathing techniques. ;To learn and demonstrate proper use of respiratory medications;To learn and understand importance of maintaining oxygen saturations>88% Other    Long  Term Goals Exhibits proper breathing techniques, such as pursed lip breathing or other method taught during program session Exhibits proper breathing techniques, such as pursed lip breathing or other method taught during program session;Compliance with respiratory medication;Maintenance of O2 saturations>88% Exhibits proper breathing techniques, such as pursed lip breathing or other method  taught during program session;Maintenance of O2 saturations>88% Other    Comments Informed patient how to perform the Pursed Lipped breathing technique. Told patient to Inhale through the nose and out the mouth with pursed lips to keep their airways open, help oxygenate them better, practice when at rest or doing strenuous activity. Patient Verbalizes understanding of technique and will work on and be reiterated during [facility]. Melissa [name] reports doing well in the program. She has noticed an improvement in her shortness of breath and wants to continue to work on her breathing techniques while enrolled in the program. She is compliant with her CPAP use. She notes that some smells will exacerbate her asthma, like when she was cleaning her oven, so she is reminded to take care with those smells. Melissa [name] reports her shortness of breath has improved since beginning the program. She was able to walk comfortably without significant shortness of breath during her cruise trip and is looking foward to her next cruise since she is feeling even stronger. She is still compliant with her  CPAP use. She has continued ot be mindful of the chemicals she is around to prevent asthma attacks Melissa [name] has no questions about her pulmonary health and feels like she is able to PLB and uses her CPAP routinely.    Goals/Expected Outcomes Short: use PLB with exertion. Long: use PLB on exertion proficiently and independently. Short: practice PLB and awareness on chemical smells. Long: independently manage breathing techniques Short: attend the program to continue working on her breathing. Long: manage her breathing and CPAP management independenlty. Short: Graduate [facility]. Long: maintain Pulmoary health independently.             Oxygen Discharge (Final Oxygen Re-Evaluation):  Oxygen Re-Evaluation - 09/25/23 1123       Program Oxygen Prescription   Program Oxygen Prescription None      Home Oxygen   Home Oxygen Device None    Sleep  Oxygen Prescription CPAP    Liters per minute 0    Home Exercise Oxygen Prescription None    Home Resting Oxygen Prescription None    Compliance with Home Oxygen Use Yes      Goals/Expected Outcomes  Short Term Goals Other    Long  Term Goals Other    Comments Melissa [name] has no questions about her pulmonary health and feels like she is able to PLB and uses her CPAP routinely.    Goals/Expected Outcomes Short: Graduate [facility]. Long: maintain Pulmoary health independently.             Initial Exercise Prescription:  Initial Exercise Prescription - 04/28/23 1500       Date of Initial Exercise RX and Referring Provider   Date 04/28/23    Referring Provider Dr. Belia Heman      Oxygen   Maintain Oxygen Saturation 88% or higher      Treadmill   MPH 3    Grade 0    Minutes 15    METs 3.5      NuStep   Level 2   T6   SPM 80    Minutes 15    METs 3.45      Elliptical   Level 1    Speed 2.5    Minutes 15    METs 3.45      REL-XR   Level 2    Speed 50    Minutes 15    METs 3.45      T5 Nustep   Level 2    SPM 80    Minutes 15    METs 3.45      Prescription Details   Frequency (times per week) 3    Duration Progress to 30 minutes of continuous aerobic without signs/symptoms of physical distress      Intensity   THRR 40-80% of Max Heartrate 115-138    Ratings of Perceived Exertion 11-13    Perceived Dyspnea 0-4      Progression   Progression Continue to progress workloads to maintain intensity without signs/symptoms of physical distress.      Resistance Training   Training Prescription Yes    Weight 4    Reps 10-15             Perform Capillary Blood Glucose checks as needed.  Exercise Prescription Changes:   Exercise Prescription Changes     Row Name 04/28/23 1500 05/08/23 1600 05/22/23 1700 06/05/23 1400 06/19/23 1300     Response to Exercise   Blood Pressure (Admit) 122/72 112/60 122/62 134/70 122/62   Blood Pressure (Exercise) 158/80 116/74  170/70 160/70 158/60   Blood Pressure (Exit) 118/62 110/60 102/60 110/70 108/52   Heart Rate (Admit) 93 bpm 112 bpm 78 bpm 82 bpm 94 bpm   Heart Rate (Exercise) 130 bpm 128 bpm 132 bpm 132 bpm 124 bpm   Heart Rate (Exit) 101 bpm 113 bpm 126 bpm 105 bpm 130 bpm   Oxygen Saturation (Admit) 95 % 95 % 99 % 98 % 92 %   Oxygen Saturation (Exercise) 93 % 90 % 90 % 81 % 88 %   Oxygen Saturation (Exit) 94 % 98 % 95 % 97 % 92 %   Rating of Perceived Exertion (Exercise) 13 13 15 15 12    Perceived Dyspnea (Exercise) 0 0 0 1 3   Symptoms none none none none none   Comments 6 MWT results First week of exercise -- -- --   Duration -- Progress to 30 minutes of  aerobic without signs/symptoms of physical distress Progress to 30 minutes of  aerobic without signs/symptoms of physical distress Progress to 30 minutes of  aerobic without signs/symptoms of physical distress Progress to 30 minutes of  aerobic without signs/symptoms of physical distress   Intensity -- THRR unchanged THRR unchanged THRR unchanged THRR unchanged     Progression   Progression -- Continue to progress workloads to maintain intensity without signs/symptoms of physical distress. Continue to progress workloads to maintain intensity without signs/symptoms of physical distress. Continue to progress workloads to maintain intensity without signs/symptoms of physical distress. Continue to progress workloads to maintain intensity without signs/symptoms of physical distress.   Average METs -- 3.72 3.96 3.8 3.31     Resistance Training   Training Prescription -- Yes Yes Yes Yes   Weight -- 4 4 5  lb 5 lb   Reps -- 10-15 10-15 10-15 10-15     Interval Training   Interval Training -- No No No No     Treadmill   MPH -- 2.8 3 3 3    Grade -- 1.5 0.5 0.5 0.5   Minutes -- 15 15 15 15    METs -- 3.72 3.5 3.5 3.5     Recumbant Bike   Level -- -- -- -- 3   Watts -- -- -- -- 22   Minutes -- -- -- -- 15   METs -- -- -- -- 2.73     NuStep   Level  -- 5 5 -- --   Minutes -- 15 15 -- --   METs -- 4.2 4.2 -- --     Elliptical   Level -- -- 1 1  10  min 1   Speed -- -- 2.5 2.5 4.3   Minutes -- -- 15 15 10      REL-XR   Level -- -- 5 5 --   Minutes -- -- 15 15 --   METs -- -- 5.6 7.1 --     T5 Nustep   Level -- 2 2 3 3    Minutes -- 15 15 15 15    METs -- -- -- 3.1 3.1     Oxygen   Maintain Oxygen Saturation -- 88% or higher 88% or higher 88% or higher 88% or higher    Row Name 07/03/23 0900 07/16/23 1100 07/31/23 1100 07/31/23 1700 08/12/23 1300     Response to Exercise   Blood Pressure (Admit) 122/62 126/64 -- 118/62 118/62   Blood Pressure (Exit) 110/62 110/60 -- 114/60 122/60   Heart Rate (Admit) 103 bpm 117 bpm -- 96 bpm 109 bpm   Heart Rate (Exercise) 152 bpm 140 bpm -- 137 bpm 139 bpm   Heart Rate (Exit) 115 bpm 116 bpm -- 115 bpm 117 bpm   Oxygen Saturation (Admit) 97 % 90 % -- 98 % 98 %   Oxygen Saturation (Exercise) 92 % 94 % -- 89 % 88 %   Oxygen Saturation (Exit) 97 % 97 % -- 98 % 98 %   Rating of Perceived Exertion (Exercise) 15 12 -- 15 13   Perceived Dyspnea (Exercise) 1 1 -- 0 0   Symptoms none none -- none none   Duration Continue with 30 min of aerobic exercise without signs/symptoms of physical distress. Continue with 30 min of aerobic exercise without signs/symptoms of physical distress. -- Continue with 30 min of aerobic exercise without signs/symptoms of physical distress. Continue with 30 min of aerobic exercise without signs/symptoms of physical distress.   Intensity THRR unchanged THRR unchanged -- THRR unchanged THRR unchanged     Progression   Progression Continue to progress workloads to maintain intensity without signs/symptoms of physical distress. Continue to progress workloads to maintain intensity without signs/symptoms  of physical distress. -- Continue to progress workloads to maintain intensity without signs/symptoms of physical distress. Continue to progress workloads to maintain intensity  without signs/symptoms of physical distress.   Average METs 3.48 3.5 -- 3.6 3.61     Resistance Training   Training Prescription Yes Yes -- Yes Yes   Weight 5 lb 5 lb -- 5 lb 5 lb   Reps 10-15 10-15 -- 10-15 10-15     Interval Training   Interval Training No No -- No No     Treadmill   MPH 3 3 -- 3 3   Grade 0.5 0.5 -- 1 1   Minutes 15 15 -- 15 15   METs 3.5 3.5 -- 3.71 3.71     NuStep   Level 3 6 -- -- --   Minutes 15 15 -- -- --   METs 3.1 -- -- -- --     Elliptical   Level 1 -- -- 1 1   Speed 4.4 -- -- 4.4 3.7   Minutes 15 -- -- 15 15     REL-XR   Level 5 -- -- 6 7   Minutes 15 -- -- 15 15     T5 Nustep   Level -- 4 -- -- --   Minutes -- 15 -- -- --   METs -- 3.6 -- -- --     Biostep-RELP   Level 1 -- -- -- --   Minutes 15 -- -- -- --   METs 4 -- -- -- --     Home Exercise Plan   Plans to continue exercise at -- -- [employer] (comment)  [employer], water aerobics, aerobic machines, weight machines and [employer] (comment)  [employer], water aerobics, aerobic machines, weight machines and [employer] (comment)  [employer], water aerobics, aerobic machines, weight machines and handweights   Frequency -- -- Add 2 additional days to program exercise sessions. Add 2 additional days to program exercise sessions. Add 2 additional days to program exercise sessions.   Initial Home Exercises Provided -- -- 07/31/23 07/31/23 07/31/23     Oxygen   Maintain Oxygen Saturation 88% or higher 88% or higher 88% or higher 88% or higher 88% or higher    Row Name 08/28/23 1400 09/10/23 1400 09/24/23 1100 10/09/23 1300       Response to Exercise   Blood Pressure (Admit) 124/74 124/70 126/62 124/68    Blood Pressure (Exit) 118/64 118/78 102/58 122/64    Heart Rate (Admit) 91 bpm 97 bpm 109 bpm 91 bpm    Heart Rate (Exercise) 138 bpm 146 bpm 145 bpm 144 bpm    Heart Rate (Exit) 121 bpm 115 bpm 116 bpm 98 bpm    Oxygen Saturation (Admit) 97 % 96 % 99 %  96 %    Oxygen Saturation (Exercise) 91 % 93 % 88 % 92 %    Oxygen Saturation (Exit) 96 % 94 % 94 % 97 %    Rating of Perceived Exertion (Exercise) 15 13 14 14     Perceived Dyspnea (Exercise) 0 0 0 1    Symptoms none none none none    Comments -- -- -- 3.93    Duration Continue with 30 min of aerobic exercise without signs/symptoms of physical distress. Continue with 30 min of aerobic exercise without signs/symptoms of physical distress. Continue with 30 min of aerobic exercise without signs/symptoms of physical distress. Continue with 30 min of aerobic exercise without signs/symptoms of physical distress.  Intensity THRR unchanged THRR unchanged THRR unchanged THRR unchanged      Progression   Progression Continue to progress workloads to maintain intensity without signs/symptoms of physical distress. Continue to progress workloads to maintain intensity without signs/symptoms of physical distress. Continue to progress workloads to maintain intensity without signs/symptoms of physical distress. Continue to progress workloads to maintain intensity without signs/symptoms of physical distress.    Average METs 4.94 5.04 5.2 3.93      Resistance Training   Training Prescription Yes Yes Yes Yes    Weight 5 lb 5 lb 5 lb 5 lb    Reps 10-15 10-15 10-15 10-15      Interval Training   Interval Training No No No No      Treadmill   MPH 3 3.3 3.2 3.2    Grade 1 1 2  2.1    Minutes 15 15 15 15     METs 3.71 3.98 4.33 4.38      Elliptical   Level 1 1 1 3     Speed 4 4.9 4.9 4.9    Minutes 15 15 15 15       REL-XR   Level 8 7 8 8     Minutes 15 15 15 15     METs 7.4 7.8 7.4 --      Home Exercise Plan   Plans to continue exercise at [employer] (comment)  [employer], water aerobics, aerobic machines, weight machines and Public house manager (comment)  [employer], water aerobics, aerobic machines, weight machines and Public house manager (comment)  [employer], water aerobics, aerobic  machines, weight machines and Public house manager (comment)  [employer], water aerobics, aerobic machines, weight machines and handweights    Frequency Add 2 additional days to program exercise sessions. Add 2 additional days to program exercise sessions. Add 2 additional days to program exercise sessions. Add 2 additional days to program exercise sessions.    Initial Home Exercises Provided 07/31/23 07/31/23 07/31/23 07/31/23      Oxygen   Maintain Oxygen Saturation 88% or higher 88% or higher 88% or higher 88% or higher             Exercise Comments:   Exercise Comments     Row Name 10/14/23 1114           Exercise Comments Maame graduated today from  rehab with 36 sessions completed.  Details of the patient's exercise prescription and what She needs to do in order to continue the prescription and progress were discussed with patient.  Patient was given a copy of prescription and goals.  Patient verbalized understanding. Asya plans to continue to exercise by going to the [facility].                Exercise Goals and Review:   Exercise Goals     Row Name 04/28/23 1509             Exercise Goals   Increase Physical Activity Yes       Intervention Provide advice, education, support and counseling about physical activity/exercise needs.;Develop an individualized exercise prescription for aerobic and resistive training based on initial evaluation findings, risk stratification, comorbidities and participant's personal goals.       Expected Outcomes Short Term: Attend rehab on a regular basis to increase amount of physical activity.;Long Term: Add in home exercise to make exercise part of routine and to increase amount of physical activity.;Long Term: Exercising regularly at least 3-5 days a week.  Increase Strength and Stamina Yes       Intervention Provide advice, education, support and counseling about physical activity/exercise needs.;Develop an individualized  exercise prescription for aerobic and resistive training based on initial evaluation findings, risk stratification, comorbidities and participant's personal goals.       Expected Outcomes Short Term: Increase workloads from initial exercise prescription for resistance, speed, and METs.;Short Term: Perform resistance training exercises routinely during rehab and add in resistance training at home;Long Term: Improve cardiorespiratory fitness, muscular endurance and strength as measured by increased METs and functional capacity ( )       Able to understand and use rate of perceived exertion (RPE) scale Yes       Intervention Provide education and explanation on how to use RPE scale       Expected Outcomes Short Term: Able to use RPE daily in rehab to express subjective intensity level;Long Term:  Able to use RPE to guide intensity level when exercising independently       Able to understand and use Dyspnea scale Yes       Intervention Provide education and explanation on how to use Dyspnea scale       Expected Outcomes Short Term: Able to use Dyspnea scale daily in rehab to express subjective sense of shortness of breath during exertion;Long Term: Able to use Dyspnea scale to guide intensity level when exercising independently       Knowledge and understanding of Target Heart Rate Range (THRR) Yes       Intervention Provide education and explanation of THRR including how the numbers were predicted and where they are located for reference       Expected Outcomes Short Term: Able to state/look up THRR;Long Term: Able to use THRR to govern intensity when exercising independently;Short Term: Able to use daily as guideline for intensity in rehab       Able to check pulse independently Yes       Intervention Provide education and demonstration on how to check pulse in carotid and radial arteries.;Review the importance of being able to check your own pulse for safety during independent exercise       Expected  Outcomes Short Term: Able to explain why pulse checking is important during independent exercise;Long Term: Able to check pulse independently and accurately       Understanding of Exercise Prescription Yes       Intervention Provide education, explanation, and written materials on patient's individual exercise prescription       Expected Outcomes Short Term: Able to explain program exercise prescription;Long Term: Able to explain home exercise prescription to exercise independently                Exercise Goals Re-Evaluation :  Exercise Goals Re-Evaluation     Row Name 05/08/23 1647 05/22/23 1712 06/05/23 1443 06/19/23 1343 06/24/23 1134     Exercise Goal Re-Evaluation   Exercise Goals Review Increase Physical Activity;Increase Strength and Stamina;Understanding of Exercise Prescription Increase Physical Activity;Increase Strength and Stamina;Understanding of Exercise Prescription Increase Physical Activity;Increase Strength and Stamina;Understanding of Exercise Prescription Increase Physical Activity;Increase Strength and Stamina;Understanding of Exercise Prescription Increase Physical Activity   Comments Raihana is off to a good start in the program. She attended 2 sessions during this review, and was able to use the treadmill, T4, and T5 nustep. We will continue to monitor her progress in the program. Melissa [name] continues to make improvements at rehab. She has recently increased her level on the XR from 2  to 5. She has also added 0.5% incline to her prescribed on the treadmill. We will continue to monitor her progress in the program. Melissa [name] continues to do well in rehab. She has been able to increase her hand weights from 4 to 5lb, and increase her level on the T5 nustep from level 2 to 3. She has also been able to maintain a consistent workload on the treadmill, and continues to try to use the elliptical for the full 20 minutes. We will continue to monitor her progress in the program. Melissa [name] continues  to do well in rehab. She has recently been able to increase her speed on the elliptical from 2.5 to 4.3 mph. She has also been able to maintain a consistent intensity on the treadmill and T5 nustep. We will continue to monitor her progress in the program. [name] a part of this class for three years and really enjoys the challenge and community. She continues to increase her levels on her machines every few sessions.   Expected Outcomes Short: Continue to follow current exercise prescription. Long: Continue exercise to improve strength and stamina. Short: Continue to follow current exercise prescription. Long: Continue exercise to improve strength and stamina. Short: Continue to work to improve time on elliptical. Long: Continue exercise to improve strength and stamina. Short: Continue to work to improve time on elliptical. Long: Continue exercise to improve strength and stamina. Short: continue to attend water aerobics and the program consistenlty. Long: independently manage exercise consistently.    Row Name 07/03/23 0913 07/16/23 1147 07/31/23 1131 07/31/23 1725 08/12/23 1305     Exercise Goal Re-Evaluation   Exercise Goals Review Increase Physical Activity;Increase Strength and Stamina;Knowledge and understanding of Target Heart Rate Range (THRR) Increase Physical Activity;Increase Strength and Stamina;Knowledge and understanding of Target Heart Rate Range (THRR) Increase Physical Activity;Increase Strength and Stamina;Knowledge and understanding of Target Heart Rate Range (THRR);Able to understand and use Dyspnea scale;Understanding of Exercise Prescription;Able to check pulse independently;Able to understand and use rate of perceived exertion (RPE) scale Increase Physical Activity;Increase Strength and Stamina;Understanding of Exercise Prescription Increase Physical Activity;Increase Strength and Stamina;Understanding of Exercise Prescription    Comments Melissa [name] is doing well in rehab. She has stayed consistent with her treadmill workload at a speed of 3 mph with an incline of 0.5%. She also was able to do the elliptical for 15 minutes after previously only being able to do 10 minutes. She has stayed consistent with her workloads on seated machines as well. We will continue to monitor her progress in the program. Melissa [name] continues to do well in rehab. She has been able to increase her level on the T4 nustep from level 3 to 6. She also increased her level on the T5 nustep from level 3 to 4. We will continue to monitor her progress in the program. Reviewed home exercise with pt today from 11:20 to 11:29.  Pt plans to go to the [facility] and attend water aerobics class 3 times a week and use aerobic machines 2 days a week for exercise.  Reviewed THR, pulse, RPE, sign and symptoms, pulse oximetery and when to call 911 or MD.  Also discussed weather considerations and indoor options.  Pt voiced understanding. Melissa [name] is doing well in rehab. She was able to increase her level on the XR from level 5 to 6. She was also able to increase her grade on the treadmill from 0.5% to 1%.  We will continue to monitor her progress in the program. Melissa [name] continues to do well in rehab. She has stayed consistent with her treadmill workload at a speed of 3 mph with an incline of 1%. She also continues to work at level 1 on the elliptical and improved to level 7 on the XR. We will continue to monitor her progress in the program.   Expected Outcomes Short: Continue to progressively increase treadmill workload. Long: Continue exercise to improve strength and stamina. Short: Continue to progressively increase treadmill workload. Long: Continue exercise to improve strength and stamina. Short: Continue to go to the [facility] on days away from rehab. Long: Continue to exercise independently. Short: Continue progressively increasing treadmill workloads. Long: Continue exercise to improve strength and stamina.  Short: Continue to progressively increase treadmill workload. Long: Continue exercise to improve strength and stamina.    Row Name 08/28/23 1424 09/02/23 1131 09/10/23 1419 09/24/23 1110 10/09/23 1322     Exercise Goal Re-Evaluation   Exercise Goals Review Increase Physical Activity;Increase Strength and Stamina;Understanding of Exercise Prescription Able to understand and use Dyspnea scale;Knowledge and understanding of Target Heart Rate Range (THRR);Able to check pulse independently;Understanding of Exercise Prescription Increase Physical Activity;Increase Strength and Stamina;Understanding of Exercise Prescription Increase Physical Activity;Increase Strength and Stamina;Understanding of Exercise Prescription Increase Physical Activity;Increase Strength and Stamina;Understanding of Exercise Prescription   Comments Melissa [name] is doing well in rehab. She has been able to increase her level on the XR from level 7 to 8. She was also able to maintain her workload on both the treadmill and elliptical. We will continue to monitor her progress in the program. Melissa [name] continues to attend her water aerobics class regularly. She already has a plan in place [facility]. She has been increasing her levels on her machines and will continue to build on that after graduation. We discussed her HR and how she has been consistently in the 140s during exercise but then cools down. The importance of cooling down was discussed when she is on her own. She wears her watch that tells her her HR and feels comfortable checking her pulse as well. Melissa [name] continues to do well in rehab. She has been able to increase her speed on the treadmill from to 3.31mph. She was also able to increase her speed on the elliptical from 4 to 4. at level 1. We will continue to monitor her progress in the program. Melissa [name] is doing well in rehab. She was able to increase her incline on the treadmill from 1% to 2%. She was also able  to return to level 8 on the XR from level 7. We will continue to monitor her progress in the program. Melissa [name] continues to do well in rehab. She was able to complete her post-6MWT and increased by 254ft. She was also able to increase her level on the Elliptical from 1 to 3. We will continue to monitor her progress in the program.   Expected Outcomes Short: Continue to progressively increase treadmill and elliptical workload. Long: Continue exercise to improve strength and stamina. Short: continue coming to the program until graduation. Long; independently manage exercise routine. Short: Prepare for her post-6MWT. Long: Continue exercise to increase strength and stamina. Short: Continue to increase treadmill workloads. Long: Continue exercise to increase strength and stamina. Short: Graduate. Long: Continue exercise to improve strength and stamina.            Discharge Exercise Prescription (Final Exercise Prescription  Changes):  Exercise Prescription Changes - 10/09/23 1300       Response to Exercise   Blood Pressure (Admit) 124/68    Blood Pressure (Exit) 122/64    Heart Rate (Admit) 91 bpm    Heart Rate (Exercise) 144 bpm    Heart Rate (Exit) 98 bpm    Oxygen Saturation (Admit) 96 %    Oxygen Saturation (Exercise) 92 %    Oxygen Saturation (Exit) 97 %    Rating of Perceived Exertion (Exercise) 14    Perceived Dyspnea (Exercise) 1    Symptoms none    Comments 3.93    Duration Continue with 30 min of aerobic exercise without signs/symptoms of physical distress.    Intensity THRR unchanged      Progression   Progression Continue to progress workloads to maintain intensity without signs/symptoms of physical distress.    Average METs 3.93      Resistance Training   Training Prescription Yes    Weight 5 lb    Reps 10-15      Interval Training   Interval Training No      Treadmill   MPH 3.2    Grade 2.1    Minutes 15    METs 4.38      Elliptical   Level 3    Speed 4.9     Minutes 15      REL-XR   Level 8    Minutes 15      Home Exercise Plan   Plans to continue exercise at [employer] (comment)   [employer], water aerobics, aerobic machines, weight machines and handweights   Frequency Add 2 additional days to program exercise sessions.    Initial Home Exercises Provided 07/31/23      Oxygen   Maintain Oxygen Saturation 88% or higher             Nutrition:  Target Goals: Understanding of nutrition guidelines, daily intake of sodium 1500mg , cholesterol 200mg , calories 30% from fat and 7% or less from saturated fats, daily to have 5 or more servings of fruits and vegetables.  Education: All About Nutrition: -Group instruction provided by verbal, written material, interactive activities, discussions, models, and posters to present general guidelines for heart healthy nutrition including fat, fiber, MyPlate, the role of sodium in heart healthy nutrition, utilization of the nutrition label, and utilization of this knowledge for meal planning. Follow up email sent as well. Written material given at graduation. Flowsheet Row Pulmonary Rehab from 07/03/2023 in [facility]  Education need identified 04/28/23  Date 07/03/23  Educator JG  Instruction Review Code 1- Verbalizes Understanding       Biometrics:  Pre Biometrics - 04/28/23 1511       Pre Biometrics   Height 6' (1.829 m)    Weight 210 lb (95.3 kg)    Waist Circumference 36 inches    Hip Circumference 47 inches    Waist to Hip Ratio 0.77 %    BMI (Calculated) 28.47    Single Leg Stand 9.43 seconds             Post Biometrics - 09/23/23 1209        Post  Biometrics   Height 6' (1.829 m)    Weight 198 lb 4.8 oz (89.9 kg)    Waist Circumference 32 inches    Hip Circumference 43 inches    Waist to Hip Ratio 0.74 %    BMI (Calculated) 26.89    Single  Leg Stand 30 seconds             Nutrition Therapy Plan and Nutrition Goals:  Nutrition Therapy &  Goals - 04/28/23 1511       Nutrition Therapy   Diet Cardiac, Low Na    Protein (specify units) 90    Fiber 25 grams    Whole Grain Foods 3 servings    Saturated Fats 15 max. grams    Fruits and Vegetables 5 servings/day    Sodium 2 grams      Personal Nutrition Goals   Nutrition Goal Eat a small but nutrient dense breakfast most days the week    Personal Goal #2 Eat a protein and carb at each meal    Personal Goal #3 Cut back on sweets, look for healthier snacks    Comments Patient drinking water and coffee only. She reports she often skips lunch due to eating breakfast late, around 11am. She reports she wakes up early and goes to water aerobics without eating breakfast. Brainstormed things to make breakfast more achievable for her, focus was on smaller portions, quick to make, grab and go friendly. Educated on what makes a good meal and snack. Set goal to pair protein and carbs together at each meal. She finds herself snacking on sweets often, talked about better snacks to try more often, reducing the frequency of these sugary foods. Reviewed mediterranean diet handout, types of fats, sources, how to read label. Built out several meals and snacks with foods she likes and will eat. Focus on smaller portions, healthy fats and reducing sugar intake from poor food choices. She is very optimistic about making changes to her eating structure and will work on consistency.      Intervention Plan   Intervention Prescribe, educate and counsel regarding individualized specific dietary modifications aiming towards targeted core components such as weight, hypertension, lipid management, diabetes, heart failure and other comorbidities.;Nutrition handout(s) given to patient.    Expected Outcomes Long Term Goal: Adherence to prescribed nutrition plan.;Short Term Goal: A plan has been developed with personal nutrition goals set during dietitian appointment.;Short Term Goal: Understand basic principles of  dietary content, such as calories, fat, sodium, cholesterol and nutrients.             Nutrition Assessments:  MEDIFICTS Score Key: >=70 Need to make dietary changes  40-70 Heart Healthy Diet <= 40 Therapeutic Level Cholesterol Diet  Flowsheet Row Pulmonary Rehab from 04/28/2023 in [facility] Cardiac and Pulmonary Rehab  Picture Your Plate Total Score on Admission 48      Picture Your Plate Scores: <44 Unhealthy dietary pattern with much room for improvement. 41-50 Dietary pattern unlikely to meet recommendations for good health and room for improvement. 51-60 More healthful dietary pattern, with some room for improvement.  >60 Healthy dietary pattern, although there may be some specific behaviors that could be improved.   Nutrition Goals Re-Evaluation:  Nutrition Goals Re-Evaluation     Row Name 06/05/23 1124 06/24/23 1131 09/02/23 1128         Goals   Current Weight 256 lb (116.1 kg) -- --     Comment Patient was informed on why it is important to maintain a balanced diet when dealing with Respiratory issues. Explained that it takes a lot of energy to breath and when they are short of breath often they will need to have a good diet to help keep up with the calories they are expending for breathing. Melissa [name] reports that she has  been trying to incoporate ideas from her meeting with the RD. She states she has cravings for sugar and carbs but she has just not been buying her typical snacks and she is strengthening her self will to resist over eating. Melissa [name] states her appetite is still very good...sometimes too good. However, she is more mindful of what she is eating and her activity levels. She is still not buying her typical sugary snacks regularly. She tries to remind herself to eat something before her water aerobics class by buying easy grab and go items.     Expected Outcome Short: Choose and plan snacks accordingly to patients caloric intake to improve breathing. Long: Maintain a diet  independently that meets their caloric intake to aid in daily shortness of breath. Short: continue to incoporate healthy snacks into her day to day planning. Long: independenty manage healthy eating. Short: eat something in the morning consistently. Long; manage healthy eating habits consistently              Nutrition Goals Discharge (Final Nutrition Goals Re-Evaluation):  Nutrition Goals Re-Evaluation - 09/02/23 1128       Goals   Comment Pat states her appetite is still very good...sometimes too good. However, she is more mindful of what she is eating and her activity levels. She is still not buying her typical sugary snacks regularly. She tries to remind herself to eat something before her water aerobics class by buying easy grab and go items.    Expected Outcome Short: eat something in the morning consistently. Long; manage healthy eating habits consistently             Psychosocial: Target Goals: Acknowledge presence or absence of significant depression and/or stress, maximize coping skills, provide positive support system. Participant is able to verbalize types and ability to use techniques and skills needed for reducing stress and depression.   Education: Stress, Anxiety, and Depression - Group verbal and visual presentation to define topics covered.  Reviews how body is impacted by stress, anxiety, and depression.  Also discusses healthy ways to reduce stress and to treat/manage anxiety and depression.  Written material given at graduation. Flowsheet Row Pulmonary Rehab from 07/03/2023 in [facility] Cardiac and Pulmonary Rehab  Date 05/29/23  Educator SB  Instruction Review Code 1- [employer] Understanding       Education: Sleep Hygiene -Provides group verbal and written instruction about how sleep can affect your health.  Define sleep hygiene, discuss sleep cycles and impact of sleep habits. Review good sleep hygiene tips.    Initial Review & Psychosocial  Screening:   Quality of Life Scores:  Scores of 19 and below usually indicate a poorer quality of life in these areas.  A difference of  2-3 points is a clinically meaningful difference.  A difference of 2-3 points in the total score of the Quality of Life Index has been associated with significant improvement in overall quality of life, self-image, physical symptoms, and general health in studies assessing change in quality of life.  PHQ-9: Review Flowsheet  More data exists      08/27/2023 04/28/2023 03/25/2023 03/14/2023 11/19/2022  Depression screen PHQ 2/9  Decreased Interest 0 0 0 0 0  Down, Depressed, Hopeless 0 0 0 0 0  PHQ - 2 Score 0 0 0 0 0  Altered sleeping 0 0 0 0 0  Tired, decreased energy 0 1 0 0 1  Change in appetite 0 0 0 0 0  Feeling bad or failure about yourself  0 0 0 0 0  Trouble concentrating 0 1 0 0 0  Moving slowly or fidgety/restless 0 0 0 0 0  Suicidal thoughts 0 0 0 0 0  PHQ-9 Score 0 2 0 0 1  Difficult doing work/chores Not difficult at all Not difficult at all - - -   Interpretation of Total Score  Total Score Depression Severity:  1-4 = Minimal depression, 5-9 = Mild depression, 10-14 = Moderate depression, 15-19 = Moderately severe depression, 20-27 = Severe depression   Psychosocial Evaluation and Intervention:   Psychosocial Re-Evaluation:  Psychosocial Re-Evaluation     Row Name 06/05/23 1126 06/24/23 1123 09/02/23 1123         Psychosocial Re-Evaluation   Current issues with Current Psychotropic Meds;Current Anxiety/Panic;History of Depression Current Psychotropic Meds;Current Anxiety/Panic;History of Depression Current Psychotropic Meds;Current Anxiety/Panic;History of Depression     Comments Melissa [name] has some anxiety and takes medication for it. In the last few weeks she has not had any mood changes. She reports no issues with their current mental states, sleep, stress, depression or anxiety. Will follow up with patient in a few weeks for any changes.  Melissa [name] is meeting with her therapist regularly to help manage her depression and anxiety. She states she has had two family members that have caused an increase in stress and she thinks that they caused a lot of issues related to her blood pressure. Now that they both are no longer involved in her life, she notes that her pressure has been much better and they have taken her off her blood pressure medications. Her therapist has helped her talk through the issues with her sister that is one of the family members that have caused her stress. They were supposed to go on a cruise together in a few weeks, but her sister decided not to go. Melissa [name] decided to continue with the cruise plans and this is her first solo time traveling. She is really excited about her trip and her kids are proud of her for planning on going. She continues to attend her water aerobics class that she has been a part of for the last three years and has talked to another patient about joining her. She really enjoys the community of friends and how it makes her feel. Melissa [name] wants to continue to take care of her health because she said she had kids later in life and wants to be healthy when she has grandkids Melissa [name] continues to meet with her therapist every two weeks. She has been doing that for years and plans to continue. The stressful situation with her sister has calmed down some. Her cruise went really well and she was very proud of herself for braving it on her own. She has planned another cruise with her neice in a couple weeks. They want her to go ziplining and sing karoke, which is out of character for her she says but she is excited to try. She feels this program has helped her feel more confident and strong to try new things like that. Her family also has a big cruise planned for this summer that she looking forward to. She states she already has a game plan to stay [facility]. She feels at  peace with her life currently and plans to keep incorporating positive health habits     Expected Outcomes Short: Continue to exercise regularly to support mental health and notify staff of any changes.  Long: maintain mental health and well being through teaching of rehab or prescribed medications independently. Short: continue to attend the program to increase her stamina and positively influence her mental health. Long; maintain positive self care habits. Short: attend the program until graduation to help solidify positive health habits. Long: maintain mental health habits     Interventions Encouraged to attend Pulmonary Rehabilitation for the exercise Encouraged to attend Pulmonary Rehabilitation for the exercise Encouraged to attend Pulmonary Rehabilitation for the exercise     Continue Psychosocial Services  Follow up required by staff Follow up required by staff --              Psychosocial Discharge (Final Psychosocial Re-Evaluation):  Psychosocial Re-Evaluation - 09/02/23 1123       Psychosocial Re-Evaluation   Current issues with Current Psychotropic Meds;Current Anxiety/Panic;History of Depression    Comments Melissa [name] continues to meet with her therapist every two weeks. She has been doing that for years and plans to continue. The stressful situation with her sister has calmed down some. Her cruise went really well and she was very proud of herself for braving it on her own. She has planned another cruise with her neice in a couple weeks. They want her to go ziplining and sing karoke, which is out of character for her she says but she is excited to try. She feels this program has helped her feel more confident and strong to try new things like that. Her family also has a big cruise planned for this summer that she looking forward to. She states she already has a game plan to stay active [facility] and continuing her [facility] aerobics. She feels at peace with her life currently  and plans to keep incorporating positive health habits    Expected Outcomes Short: attend the program until graduation to help solidify positive health habits. Long: maintain mental health habits    Interventions Encouraged to attend Pulmonary Rehabilitation for the exercise             Education: Education Goals: Education classes will be provided on a weekly basis, covering required topics. Participant will state understanding/return demonstration of topics presented.  Learning Barriers/Preferences:   General Pulmonary Education Topics:  Infection Prevention: - Provides verbal and written material to individual with discussion of infection control including proper hand washing and proper equipment cleaning during exercise session. Flowsheet Row Pulmonary Rehab from 07/03/2023 in [facility] Cardiac and Pulmonary Rehab  Date 04/28/23  Educator [facility]  Instruction Review Code 1- Verbalizes Understanding       Falls Prevention: - Provides verbal and written material to individual with discussion of falls prevention and safety. Flowsheet Row Pulmonary Rehab from 07/03/2023 in [facility] Cardiac and Pulmonary Rehab  Date 04/28/23  Educator [facility]  Instruction Review Code 1- Verbalizes Understanding       Chronic Lung Disease Review: - Group verbal instruction with posters, models, PowerPoint presentations and videos,  to review new updates, new respiratory medications, new advancements in procedures and treatments. Providing information on websites and "800" numbers for continued self-education. Includes information about supplement oxygen, available portable oxygen systems, continuous and intermittent flow rates, oxygen safety, concentrators, and Medicare reimbursement for oxygen. Explanation of Pulmonary Drugs, including class, frequency, complications, importance of spacers, rinsing mouth after steroid MDI's, and proper cleaning methods for nebulizers. Review of basic lung anatomy and physiology  related to function, structure, and complications of lung disease. Review of risk factors. Discussion about methods for  diagnosing sleep apnea and types of masks and machines for OSA. Includes a review of the use of types of environmental controls: home humidity, furnaces, filters, dust mite/pet prevention, HEPA vacuums. Discussion about weather changes, air quality and the benefits of nasal washing. Instruction on Warning signs, infection symptoms, calling MD promptly, preventive modes, and value of vaccinations. Review of effective airway clearance, coughing and/or vibration techniques. Emphasizing that all should Create an Action Plan. Written material given at graduation. Flowsheet Row Pulmonary Rehab from 07/03/2023 in [facility] Cardiac and Pulmonary Rehab  Education need identified 04/28/23  Date 05/15/23  Educator [facility]  Instruction Review Code 1- Verbalizes Understanding       AED/CPR: - Group verbal and written instruction with the use of models to demonstrate the basic use of the AED with the basic ABC's of resuscitation.    Anatomy and Cardiac Procedures: - Group verbal and visual presentation and models provide information about basic cardiac anatomy and function. Reviews the testing methods done to diagnose heart disease and the outcomes of the test results. Describes the treatment choices: Medical Management, Angioplasty, or Coronary Bypass Surgery for treating various heart conditions including Myocardial Infarction, Angina, Valve Disease, and Cardiac Arrhythmias.  Written material given at graduation.   Medication Safety: - Group verbal and visual instruction to review commonly prescribed medications for heart and lung disease. Reviews the medication, class of the drug, and side effects. Includes the steps to properly store meds and maintain the prescription regimen.  Written material given at graduation.   Other: -Provides group and verbal instruction on various topics (see  comments)   Knowledge Questionnaire Score:  Knowledge Questionnaire Score - 04/28/23 1513       Knowledge Questionnaire Score   Pre Score 11/18              Core Components/Risk Factors/Patient Goals at Admission:  Personal Goals and Risk Factors at Admission - 04/28/23 1512       Core Components/Risk Factors/Patient Goals on Admission    Weight Management Yes;Weight Loss    Intervention Weight Management/Obesity: Establish reasonable short term and long term weight goals.;Weight Management: Provide education and appropriate resources to help participant work on and attain dietary goals.;Weight Management: Develop a combined nutrition and exercise program designed to reach desired caloric intake, while maintaining appropriate intake of nutrient and fiber, sodium and fats, and appropriate energy expenditure required for the weight goal.    Admit Weight 210 lb (95.3 kg)    Goal Weight: Short Term 200 lb (90.7 kg)    Goal Weight: Long Term 180 lb (81.6 kg)    Expected Outcomes Short Term: Continue to assess and modify interventions until short term weight is achieved;Long Term: Adherence to nutrition and physical activity/exercise program aimed toward attainment of established weight goal;Weight Loss: Understanding of general recommendations for a balanced deficit meal plan, which promotes 1-2 lb weight loss per week and includes a negative energy balance of [phone] kcal/d;Understanding recommendations for meals to include 15-35% energy as protein, 25-35% energy from fat, 35-60% energy from carbohydrates, less than 200mg  of dietary cholesterol, 20-35 gm of total fiber daily;Understanding of distribution of calorie intake throughout the day with the consumption of 4-5 meals/snacks    Improve shortness of breath with ADL's Yes    Intervention Provide education, individualized exercise plan and daily activity instruction to help decrease symptoms of SOB with activities of daily living.     Expected Outcomes Short Term: Improve cardiorespiratory fitness to achieve a reduction of symptoms  when performing ADLs;Long Term: Be able to perform more ADLs without symptoms or delay the onset of symptoms    Hypertension Yes    Intervention Provide education on lifestyle modifcations including regular physical activity/exercise, weight management, moderate sodium restriction and increased consumption of fresh fruit, vegetables, and low fat dairy, alcohol moderation, and smoking cessation.;Monitor prescription use compliance.    Expected Outcomes Short Term: Continued assessment and intervention until BP is < 140/65mm HG in hypertensive participants. < 130/58mm HG in hypertensive participants with diabetes, heart failure or chronic kidney disease.;Long Term: Maintenance of blood pressure at goal levels.    Lipids Yes    Intervention Provide education and support for participant on nutrition & aerobic/resistive exercise along with prescribed medications to achieve LDL 70mg , HDL >40mg .    Expected Outcomes Short Term: Participant states understanding of desired cholesterol values and is compliant with medications prescribed. Participant is following exercise prescription and nutrition guidelines.;Long Term: Cholesterol controlled with medications as prescribed, with individualized exercise RX and with personalized nutrition plan. Value goals: LDL < 70mg , HDL > 40 mg.             Education:Diabetes - Individual verbal and written instruction to review signs/symptoms of diabetes, desired ranges of glucose level fasting, after meals and with exercise. Acknowledge that pre and post exercise glucose checks will be done for 3 sessions at entry of program.   Know Your Numbers and Heart Failure: - Group verbal and visual instruction to discuss disease risk factors for cardiac and pulmonary disease and treatment options.  Reviews associated critical values for Overweight/Obesity, Hypertension,  Cholesterol, and Diabetes.  Discusses basics of heart failure: signs/symptoms and treatments.  Introduces Heart Failure Zone chart for action plan for heart failure.  Written material given at graduation. Flowsheet Row Pulmonary Rehab from 07/03/2023 in [facility] Cardiac and Pulmonary Rehab  Education need identified 04/28/23  Date 05/22/23  Educator [facility]  Instruction Review Code 1- Verbalizes Understanding       Core Components/Risk Factors/Patient Goals Review:   Goals and Risk Factor Review     Row Name 06/05/23 1123 06/24/23 1120 09/02/23 1119         Core Components/Risk Factors/Patient Goals Review   Personal Goals Review Improve shortness of breath with ADL's Hypertension;Improve shortness of breath with ADL's Hypertension;Lipids;Improve shortness of breath with ADL's     Review Spoke to patient about their shortness of breath and what they can do to improve. Patient has been informed of breathing techniques when starting the program. Patient is informed to tell staff if they have had any med changes and that certain meds they are taking or not taking can be causing shortness of breath. Pat's blood pressure medication was stopped. She notes that her pressures have been stable since then. She thinks a lot of her blood pressure issues were coming from family stressors and now that those have been removed, her blood pressure has been more stable. She is encouraged to check her blood pressure regularly with her home blood pressure monitor. She has been working on her breathing techniques while in the program and has noted some increase her stamina. She is hopeful that coming regularly and continuing to do her water aerobics will help keep her motivated to keep up her hard work. Melissa [name] saw her doctor last week and received a great report. She can stay off her blood pressure medications. She has been checking her bp at home and it has been stable. Her lipids are within the limits  her doctor wants. She  notes her shortness of breathing has improved and practices deep breathing when needed. She has about 10 more sessions left in the program and wants to keep improving on her risk factor management     Expected Outcomes Short: Attend [facility] regularly to improve shortness of breath with ADL's. Long: maintain independence with ADL's Short: attend the program consistently to work on her blood pressure managment and breathing techniques. Long: independently manage her risk factors Short: graduate from the program with confidence in managing her risk factors. Long: independently manage her risk factors.              Core Components/Risk Factors/Patient Goals at Discharge (Final Review):   Goals and Risk Factor Review - 09/02/23 1119       Core Components/Risk Factors/Patient Goals Review   Personal Goals Review Hypertension;Lipids;Improve shortness of breath with ADL's    Review Melissa [name] saw her doctor last week and received a great report. She can stay off her blood pressure medications. She has been checking her bp at home and it has been stable. Her lipids are within the limits her doctor wants. She notes her shortness of breathing has improved and practices deep breathing when needed. She has about 10 more sessions left in the program and wants to keep improving on her risk factor management    Expected Outcomes Short: graduate from the program with confidence in managing her risk factors. Long: independently manage her risk factors.             ITP Comments:  ITP Comments     Row Name 04/28/23 1458 05/21/23 0952 06/18/23 0838 07/09/23 1128 08/06/23 0945   ITP Comments Completed and gym orientation. Initial ITP created and sent for review to Dr. Jinny Sanders, Medical Director. 30 Day review completed. Medical Director ITP review done, changes made as directed, and signed approval by Medical Director.    new to program 30 Day review completed. Medical Director ITP review done, changes  made as directed, and signed approval by Medical Director. 30 Day review completed. Medical Director ITP review done, changes made as directed, and signed approval by Medical Director. 30 Day review completed. Medical Director ITP review done, changes made as directed, and signed approval by Medical Director.    Row Name 09/03/23 1252 10/01/23 0751 10/14/23 1114       ITP Comments 30 Day review completed. Medical Director ITP review done, changes made as directed, and signed approval by Medical Director. 30 Day review completed. Medical Director ITP review done, changes made as directed, and signed approval by Medical Director. Kierra graduated today from  rehab with 36 sessions completed.  Details of the patient's exercise prescription and what She needs to do in order to continue the prescription and progress were discussed with patient.  Patient was given a copy of prescription and goals.  Patient verbalized understanding. Verble plans to continue to exercise by going to the [facility].              Comments: Discharge ITP

## 2023-10-14 NOTE — Progress Notes (Signed)
 Discharge Note for  Melissa [name]     08/24/1950        Melissa [name] graduated today from  rehab with 36 sessions completed.  Details of the patient's exercise prescription and what She needs to do in order to continue the prescription and progress were discussed with patient.  Patient was given a copy of prescription and goals.  Patient verbalized understanding. [name] plans to continue to exercise by going to the [facility].    6 Minute Walk     Row Name 04/28/23 1459 09/23/23 1204       6 Minute Walk   Phase Initial Discharge    Distance 1290 feet 1520 feet    Distance % Change -- 17.8 %    Distance Feet Change -- 230 ft    Walk Time 6 minutes 6 minutes    # of Rest Breaks 0 0    MPH 2.44 2.9    METS 3.45 3.45    RPE 13 9    Perceived Dyspnea  0 0    VO2 Peak 12.06 --    Symptoms No No    Resting HR 93 bpm 108 bpm    Resting BP 122/72 104/62    Resting Oxygen Saturation  95 % 95 %    Exercise Oxygen Saturation  during 6 min walk 93 % 92 %    Max Ex. HR 130 bpm 100 bpm    Max Ex. BP 158/80 128/66    2 Minute Post BP 118/62 112/64      Interval HR   1 Minute HR 114 100    2 Minute HR 117 --    3 Minute HR 118 --    4 Minute HR 120 --    5 Minute HR 130 --    6 Minute HR 127 100    2 Minute Post HR 101 99    Interval Heart Rate? Yes Yes      Interval Oxygen   Interval Oxygen? Yes Yes    Baseline Oxygen Saturation % 95 % 89 %    1 Minute Oxygen Saturation % 98 % --    1 Minute Liters of Oxygen 0 L --    2 Minute Oxygen Saturation % 93 % --    2 Minute Liters of Oxygen 0 L --    3 Minute Oxygen Saturation % 94 % 87 %    3 Minute Liters of Oxygen 0 L --    4 Minute Oxygen Saturation % 95 % 84 %    4 Minute Liters of Oxygen 0 L --    5 Minute Oxygen Saturation % 93 % 85 %    5 Minute Liters of Oxygen 0 L --    6 Minute Oxygen Saturation % 95 % 92 %    6 Minute Liters of Oxygen 0 L --    2 Minute Post Oxygen Saturation % 94 % 96 %    2 Minute Post Liters of Oxygen 0 L --

## 2023-10-14 NOTE — Progress Notes (Signed)
 Daily Session Note  Patient Details  Name: LENNOX LEIKAM MRN: [id number] Date of Birth: Mar 13, 1951 Referring Provider:   Flowsheet Row Pulmonary Rehab from 04/28/2023 in [facility] Cardiac and Pulmonary Rehab  Referring Provider Dr. Belia Heman       Encounter Date: 10/14/2023  Check In:  Session Check In - 10/14/23 1113       Check-In   Supervising physician immediately available to respond to emergencies See telemetry face sheet for immediately available ER MD    Location [facility]-Cardiac & Pulmonary Rehab    Staff Present Rory Percy, MS, Exercise Physiologist;Maxon Conetta BS, Exercise Physiologist;Petrita Blunck, RN, BSN, CCRP;Meredith Craven RN,BSN;Noah Tickle, BS, Exercise Physiologist    Virtual Visit No    Medication changes reported     No    Fall or balance concerns reported    No    Warm-up and Cool-down Performed on first and last piece of equipment    Resistance Training Performed Yes    VAD Patient? No    PAD/SET Patient? No      Pain Assessment   Currently in Pain? No/denies                Social History   Tobacco Use  Smoking Status Never  Smokeless Tobacco Never  Tobacco Comments   Second hand smoke exposure    Goals Met:  Proper associated with RPD/PD & O2 Sat Independence with exercise equipment Exercise tolerated well No report of concerns or symptoms today  Goals Unmet:  Not Applicable  Comments:  Mariyam graduated today from  rehab with 36 sessions completed.  Details of the patient's exercise prescription and what She needs to do in order to continue the prescription and progress were discussed with patient.  Patient was given a copy of prescription and goals.  Patient verbalized understanding. Kierrah plans to continue to exercise by going to the [facility].    Dr. Bethann Punches is Medical Director for [facility] Cardiac Rehabilitation.  Dr. Vida Rigger is Medical Director for [facility] Pulmonary Rehabilitation.

## 2023-10-16 ENCOUNTER — Telehealth: Payer: Self-pay | Admitting: Internal Medicine

## 2023-10-16 DIAGNOSIS — J4521 Mild intermittent asthma with (acute) exacerbation: Secondary | ICD-10-CM

## 2023-10-16 DIAGNOSIS — N1832 Chronic kidney disease, stage 3b: Secondary | ICD-10-CM | POA: Diagnosis not present

## 2023-10-16 DIAGNOSIS — I129 Hypertensive chronic kidney disease with stage 1 through stage 4 chronic kidney disease, or unspecified chronic kidney disease: Secondary | ICD-10-CM | POA: Diagnosis not present

## 2023-10-16 DIAGNOSIS — D869 Sarcoidosis, unspecified: Secondary | ICD-10-CM

## 2023-10-16 DIAGNOSIS — Q613 Polycystic kidney, unspecified: Secondary | ICD-10-CM | POA: Diagnosis not present

## 2023-10-16 DIAGNOSIS — D631 Anemia in chronic kidney disease: Secondary | ICD-10-CM | POA: Diagnosis not present

## 2023-10-16 DIAGNOSIS — R319 Hematuria, unspecified: Secondary | ICD-10-CM | POA: Diagnosis not present

## 2023-10-16 MED ORDER — TRELEGY ELLIPTA 200-62.5-25 MCG/ACT IN AEPB: INHALATION_SPRAY | RESPIRATORY_TRACT | 3 refills | Status: DC

## 2023-10-16 NOTE — Telephone Encounter (Signed)
 Pt states pharmacy won't refill Trelegy without approval.

## 2023-10-16 NOTE — Telephone Encounter (Signed)
 Refill sent to [facility] pharmacy. Nothing further needed.

## 2023-10-17 ENCOUNTER — Encounter (INDEPENDENT_AMBULATORY_CARE_PROVIDER_SITE_OTHER): Payer: Self-pay | Admitting: Vascular Surgery

## 2023-10-17 ENCOUNTER — Ambulatory Visit (INDEPENDENT_AMBULATORY_CARE_PROVIDER_SITE_OTHER): Payer: Medicare Other | Admitting: Vascular Surgery

## 2023-10-17 VITALS — BP 134/82 | HR 87 | Resp 18 | Ht 72.0 in | Wt 202.2 lb

## 2023-10-17 DIAGNOSIS — E785 Hyperlipidemia, unspecified: Secondary | ICD-10-CM | POA: Diagnosis not present

## 2023-10-17 DIAGNOSIS — I10 Essential (primary) hypertension: Secondary | ICD-10-CM | POA: Diagnosis not present

## 2023-10-17 DIAGNOSIS — I728 Aneurysm of other specified arteries: Secondary | ICD-10-CM | POA: Diagnosis not present

## 2023-10-17 DIAGNOSIS — N1832 Chronic kidney disease, stage 3b: Secondary | ICD-10-CM | POA: Diagnosis not present

## 2023-10-17 NOTE — Progress Notes (Signed)
 Patient ID: Melissa [name], female   DOB: [birth year], 73 y.o.   MRN: [id number]  Chief Complaint  Patient presents with   New Patient (Initial Visit)    consult. celiac aneurysm 5.31.24 CT in Epic. sowles, krichna    HPI Melissa [name] is a 73 y.o. female.  I am asked to see the patient by Dr. 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  This was seen on a CT scan of the chest in May 2024 as well as a CT scan of the abdomen in February 2024.  I have reviewed the studies.  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  And the radiologist interpretation, they mention that on the scan going all the way back to 2012 this was present and has not significantly changed.  She does not have any symptoms of chronic visceral ischemia such as unintentional weight loss, food fear, or abdominal pain.  She does not have any severe back or abdominal pain concerning for aneurysmal pain.  She had been seen in our office 4 to 5 years ago for leg swelling which has been stable.     Past Medical History:  Diagnosis Date   Aortic atherosclerosis (HCC)    Asthma    Bruising    Cardiomyopathy (HCC)    Celiac artery aneurysm (HCC)    Cervical radiculopathy    CKD (chronic kidney disease), stage III (HCC)    Deficiency of vitamin B    Depression    Edema    Flank pain    Gross hematuria    History of 2019 novel coronavirus disease (COVID-19) 05/01/2019   History of bariatric surgery    HLD (hyperlipidemia)    HTN (hypertension)    Interstitial lung disease (HCC)    Iron deficiency anemia    Leukopenia    OA (osteoarthritis)    Obesity    OSA on CPAP    Osteopenia    Perennial allergic rhinitis    Pneumonia due to COVID-19 virus 2020   Renal cysts, acquired, bilateral    RLS (restless legs syndrome)    Sarcoidosis    Sickle cell trait (HCC)    Snoring    Spinal stenosis    Trigeminal neuralgia     Past Surgical History:  Procedure  Laterality Date   BARIATRIC SURGERY     CESAREAN SECTION     3 or more   COLONOSCOPY WITH PROPOFOL N/A 03/15/2019   Procedure: COLONOSCOPY WITH PROPOFOL;  Surgeon: Toney Reil, MD;  Location: [facility] ENDOSCOPY;  Service: Gastroenterology;  Laterality: N/A;   COLONOSCOPY WITH PROPOFOL N/A 03/15/2022   Procedure: COLONOSCOPY WITH PROPOFOL;  Surgeon: Toney Reil, MD;  Location: [facility];  Service: Gastroenterology;  Laterality: N/A;   GASTRIC BYPASS     KNEE ARTHROPLASTY Right 10/18/2020   Procedure: COMPUTER ASSISTED TOTAL KNEE ARTHROPLASTY;  Surgeon: Donato Heinz, MD;  Location: [facility] ORS;  Service: Orthopedics;  Laterality: Right;   LEFT HEART CATH AND CORONARY ANGIOGRAPHY N/A 01/20/2023   Procedure: LEFT HEART CATH AND CORONARY ANGIOGRAPHY;  Surgeon: Iran Ouch, MD;  Location: [facility] INVASIVE CV LAB;  Service: Cardiovascular;  Laterality: N/A;   SHOULDER ARTHROSCOPY Right    TUBAL LIGATION       Family History  Problem Relation Age of Onset   Hypercholesterolemia Mother    Heart disease Mother    Hypertension Mother    Alcohol abuse Father  Lung cancer Brother    Alcohol abuse Brother    Diabetes Mellitus II Sister    Hypertension Maternal Grandmother    Colon cancer Sister    Breast cancer Neg Hx    Prostate cancer Neg Hx    Bladder Cancer Neg Hx    Kidney cancer Neg Hx      Social History   Tobacco Use   Smoking status: Never   Smokeless tobacco: Never   Tobacco comments:    Second hand smoke exposure  Vaping Use   Vaping status: Never Used  Substance Use Topics   Alcohol use: Not Currently    Alcohol/week: 0.0 standard drinks of alcohol   Drug use: No     Allergies  Allergen Reactions   Shellfish Allergy     Edema    Current Outpatient Medications  Medication Sig Dispense Refill   acetaminophen (TYLENOL) 650 MG CR tablet Take 1,300 mg by mouth every 8 (eight) hours as needed for pain.     albuterol (VENTOLIN HFA) 108 (90 Base)  MCG/ACT inhaler INHALE 1-2 PUFFS BY MOUTH EVERY 6 HOURS AS NEEDED FOR WHEEZE OR SHORTNESS OF BREATH 18 each 0   Ascorbic Acid (VITAMIN C) 1000 MG tablet Take 1,000 mg by mouth daily.     aspirin EC 81 MG tablet Take 1 tablet by mouth daily.     atorvastatin (LIPITOR) 40 MG tablet Take 1 tablet (40 mg total) by mouth daily. 90 tablet 1   azelastine (ASTELIN) 0.1 % nasal spray Place 2 sprays into both nostrils 2 (two) times daily. Use in each nostril as directed 30 mL 12   calcium carbonate (TUMS - DOSED IN MG ELEMENTAL CALCIUM) 500 MG chewable tablet Chew 500-1,000 mg by mouth daily as needed for indigestion or heartburn.     Cholecalciferol (D 1000) 25 MCG (1000 UT) capsule Take 1,000 Units by mouth daily.     ezetimibe (ZETIA) 10 MG tablet TAKE 1 TABLET BY MOUTH EVERY DAY 90 tablet 1   fluticasone (FLONASE) 50 MCG/ACT nasal spray SPRAY 2 SPRAYS INTO EACH NOSTRIL EVERY DAY 48 mL 1   Fluticasone-Umeclidin-Vilant (TRELEGY ELLIPTA) 200-62.5-25 MCG/ACT AEPB INHALE ONE ACTUATION INTO THE LUNGS DAILY. 60 each 3   gabapentin (NEURONTIN) 100 MG capsule Take 100-200 mg by mouth 2 (two) times daily. One in am and two at night     JARDIANCE 10 MG TABS tablet Take 10 mg by mouth daily.     levocetirizine (XYZAL ALLERGY 24HR) 5 MG tablet Take 1 tablet (5 mg total) by mouth every evening. 30 tablet 6   magnesium oxide (MAG-OX) 400 MG tablet Take 400 mg by mouth daily.     Multiple Vitamins-Minerals (CENTRUM SILVER) tablet Take 1 tablet by mouth daily. 30 tablet 0   nortriptyline (PAMELOR) 10 MG capsule Take 10-20 mg by mouth at bedtime.     Probiotic Product (FORTIFY PROBIOTIC WOMENS EX ST PO) Take by mouth daily.     propranolol (INDERAL) 10 MG tablet Take 1 tablet (10 mg total) by mouth 3 (three) times daily as needed. For sustained fast heart rate 60 tablet 3   zinc gluconate 50 MG tablet Take 50 mg by mouth daily.     No current facility-administered medications for this visit.     REVIEW OF SYSTEMS  (Negative unless checked)   Constitutional: [] Weight loss  [] Fever  [] Chills Cardiac: [] Chest pain   [] Chest pressure   [] Palpitations   [] Shortness of breath when laying flat   []   Shortness of breath at rest   [] Shortness of breath with exertion. Vascular:  [] Pain in legs with walking   [] Pain in legs at rest   [] Pain in legs when laying flat   [] Claudication   [] Pain in feet when walking  [] Pain in feet at rest  [] Pain in feet when laying flat   [] History of DVT   [] Phlebitis   [x] Swelling in legs   [] Varicose veins   [] Non-healing ulcers Pulmonary:   [] Uses home oxygen   [] Productive cough   [] Hemoptysis   [] Wheeze  [] COPD   [] Asthma Neurologic:  [] Dizziness  [] Blackouts   [] Seizures   [] History of stroke   [] History of TIA  [] Aphasia   [] Temporary blindness   [] Dysphagia   [] Weakness or numbness in arms   [] Weakness or numbness in legs Musculoskeletal:  [] Arthritis   [] Joint swelling   [] Joint pain   [x] Low back pain Hematologic:  [] Easy bruising  [] Easy bleeding   [] Hypercoagulable state   [x] Anemic  [] Hepatitis Gastrointestinal:  [] Blood in stool   [] Vomiting blood  [] Gastroesophageal reflux/heartburn   [] Abdominal pain Genitourinary:  [] Chronic kidney disease   [] Difficult urination  [] Frequent urination  [] Burning with urination   [] Hematuria Skin:  [] Rashes   [] Ulcers   [] Wounds Psychological:  [] History of anxiety   [x]  History of major depression.    Physical Exam BP 134/82   Pulse 87   Resp 18   Ht 6' (1.829 m)   Wt 202 lb 3.2 oz (91.7 kg)   BMI 27.42 kg/m  Gen:  WD/WN, NAD. Appears younger than stated age. Head: /AT, No temporalis wasting. Prominent temp pulse not noted. Ear/Nose/Throat: Hearing grossly intact, nares w/o erythema or drainage, oropharynx w/o Erythema/Exudate Eyes: Conjunctiva clear, sclera non-icteric  Neck: trachea midline.  No JVD.  Pulmonary:  Good air movement, respirations not labored, no use of accessory muscles  Cardiac: RRR, no JVD Vascular:   Vessel Right Left  Radial Palpable Palpable                                   Gastrointestinal:. No tenderness, rebound or guarding. Musculoskeletal: M/S 5/5 throughout.  Extremities without ischemic changes.  No deformity or atrophy. Trace LE edema. Neurologic: Sensation grossly intact in extremities.  Symmetrical.  Speech is fluent. Motor exam as listed above. Psychiatric: Judgment intact, Mood & affect appropriate for pt's clinical situation. Dermatologic: No rashes or ulcers noted.  No cellulitis or open wounds.    Radiology No results found.  Labs No results found for this or any previous visit (from the past 2160 hours).  Assessment/Plan:  HLD (hyperlipidemia) lipid control important in reducing the progression of atherosclerotic disease. Continue statin therapy   Aneurysm of celiac artery (HCC) The patient has a 15 mm celiac artery aneurysm which has been present for many years.  Her last study was almost a year ago by CT.  I would recommend trying to follow this with duplex and we can get this scheduled sometime in the coming months at her convenience.  I discussed symptoms of chronic visceral ischemia to keep an eye out for.  I discussed that we generally do not repair these aneurysms until they are 20 to 25 mm in diameter.  Endovascular options for repair exist and are preferred but given the location this would likely mean sacrificing the left gastric artery and splenic artery.  She voices her understanding and we will see  her back with a duplex in the coming months.  Hypertension, benign blood pressure control important in reducing the progression of atherosclerotic disease and aneurysmal growth. On appropriate oral medications.     Chronic kidney disease (CKD), stage III (moderate) Could certainly contribute to lower extremity swelling    Festus Barren 10/19/2023, 11:48 AM   This note was created with Dragon medical transcription system.  Any errors from  dictation are unintentional.

## 2023-10-17 NOTE — Assessment & Plan Note (Signed)
 lipid control important in reducing the progression of atherosclerotic disease. Continue statin therapy

## 2023-10-19 NOTE — Assessment & Plan Note (Signed)
 The patient has a 15 mm celiac artery aneurysm which has been present for many years.  Her last study was almost a year ago by CT.  I would recommend trying to follow this with duplex and we can get this scheduled sometime in the coming months at her convenience.  I discussed symptoms of chronic visceral ischemia to keep an eye out for.  I discussed that we generally do not repair these aneurysms until they are 20 to 25 mm in diameter.  Endovascular options for repair exist and are preferred but given the location this would likely mean sacrificing the left gastric artery and splenic artery.  She voices her understanding and we will see her back with a duplex in the coming months.

## 2023-10-22 DIAGNOSIS — F411 Generalized anxiety disorder: Secondary | ICD-10-CM | POA: Diagnosis not present

## 2023-10-27 ENCOUNTER — Encounter: Payer: Self-pay | Admitting: Nurse Practitioner

## 2023-10-27 ENCOUNTER — Ambulatory Visit (INDEPENDENT_AMBULATORY_CARE_PROVIDER_SITE_OTHER): Admitting: Nurse Practitioner

## 2023-10-27 VITALS — BP 132/84 | Ht 72.0 in | Wt 200.7 lb

## 2023-10-27 DIAGNOSIS — I10 Essential (primary) hypertension: Secondary | ICD-10-CM | POA: Diagnosis not present

## 2023-10-27 DIAGNOSIS — I8393 Asymptomatic varicose veins of bilateral lower extremities: Secondary | ICD-10-CM

## 2023-10-27 NOTE — Telephone Encounter (Signed)
 Pt wanted OV to discuss her BP being to high . Pt is scheduled with Raynelle Fanning for today

## 2023-10-27 NOTE — Progress Notes (Signed)
 BP 132/84   Ht 6' (1.829 m)   Wt 200 lb 11.2 oz (91 kg)   SpO2 (!) 18%   BMI 27.22 kg/m    Subjective:    Patient ID: Melissa [name], female    DOB: [birth year], 73 y.o.   MRN: [id number]  HPI: Melissa [name] is a 73 y.o. female  Chief Complaint  Patient presents with   Hypertension    Discussed the use of AI scribe software for clinical note transcription with the patient, who gave verbal consent to proceed.  History of Present Illness   The patient, with hypertension, presents with concerns about blood pressure management and leg bumps.  She has a history of hypertension and is not currently on antihypertensive medication. Previously, she was on 50 mg of losartan and 10 mg of amlodipine. Her recent home blood pressure reading was 145/90, which she finds concerning, especially as she is planning to travel out of town on Friday. She recalls her blood pressure being low during pulmonary rehab sessions, but she is worried due to a recent drop in her kidney function as reported by her nephrologist.  She has noticed bumps on the back of her leg, which she discovered while using a washcloth after gym workouts. She applies moisturizer to the area and is seeking advice on management.  She is planning to travel to support her nephew, whose partner is expecting a baby. She will be flying and plans to stay for one to two weeks to assist with childcare. She has been monitoring her blood pressure and plans to take her blood pressure machine with her on the trip.  No current pulmonary issues, but she requests a chest check due to past pulmonary concerns.       08/27/2023   10:09 AM 04/28/2023    3:16 PM 03/25/2023    9:49 AM  Depression screen PHQ 2/9  Decreased Interest 0 0 0  Down, Depressed, Hopeless 0 0 0  PHQ - 2 Score 0 0 0  Altered sleeping 0 0 0  Tired, decreased energy 0 1 0  Change in appetite 0 0 0  Feeling bad or failure about yourself  0 0 0  Trouble concentrating 0 1 0   Moving slowly or fidgety/restless 0 0 0  Suicidal thoughts 0 0 0  PHQ-9 Score 0 2 0  Difficult doing work/chores Not difficult at all Not difficult at all     Relevant past medical, surgical, family and social history reviewed and updated as indicated. Interim medical history since our last visit reviewed. Allergies and medications reviewed and updated.  Review of Systems  Ten systems reviewed and is negative except as mentioned in HPI      Objective:    BP 132/84   Ht 6' (1.829 m)   Wt 200 lb 11.2 oz (91 kg)   SpO2 (!) 18%   BMI 27.22 kg/m     Wt Readings from Last 3 Encounters:  10/27/23 200 lb 11.2 oz (91 kg)  10/17/23 202 lb 3.2 oz (91.7 kg)  09/23/23 198 lb 4.8 oz (89.9 kg)    Physical Exam Vitals reviewed.  Constitutional:      Appearance: Normal appearance.  HENT:     Head: Normocephalic.  Cardiovascular:     Rate and Rhythm: Normal rate and regular rhythm.  Pulmonary:     Effort: Pulmonary effort is normal.     Breath sounds: Normal breath sounds.  Musculoskeletal:  General: Normal range of motion.  Skin:    General: Skin is warm and dry.  Neurological:     General: No focal deficit present.     Mental Status: She is alert and oriented to person, place, and time. Mental status is at baseline.  Psychiatric:        Mood and Affect: Mood normal.        Behavior: Behavior normal.        Thought Content: Thought content normal.        Judgment: Judgment normal.     Results for orders placed or performed in visit on 05/19/23  CBC with Differential/Platelet   Collection Time: 05/26/23 10:36 AM  Result Value Ref Range   WBC 5.6 3.4 - 10.8 x10E3/uL   RBC 4.15 3.77 - 5.28 x10E6/uL   Hemoglobin 12.4 11.1 - 15.9 g/dL   Hematocrit 40.9 81.1 - 46.6 %   MCV 94 79 - 97 fL   MCH 29.9 26.6 - 33.0 pg   MCHC 31.9 31.5 - 35.7 g/dL   RDW 91.4 78.2 - 95.6 %   Platelets 216 150 - 450 x10E3/uL   Neutrophils 49 Not Estab. %   Lymphs 40 Not Estab. %    Monocytes 8 Not Estab. %   Eos 2 Not Estab. %   Basos 1 Not Estab. %   Neutrophils Absolute 2.7 1.4 - 7.0 x10E3/uL   Lymphocytes Absolute 2.2 0.7 - 3.1 x10E3/uL   Monocytes Absolute 0.5 0.1 - 0.9 x10E3/uL   EOS (ABSOLUTE) 0.1 0.0 - 0.4 x10E3/uL   Basophils Absolute 0.1 0.0 - 0.2 x10E3/uL   Immature Granulocytes 0 Not Estab. %   Immature Grans (Abs) 0.0 0.0 - 0.1 x10E3/uL  B12 and Folate Panel   Collection Time: 05/26/23 10:36 AM  Result Value Ref Range   Vitamin B-12 >2000 (H) 232 - 1245 pg/mL   Folate >20.0 >3.0 ng/mL  Iron, TIBC and Ferritin Panel   Collection Time: 05/26/23 10:36 AM  Result Value Ref Range   Total Iron Binding Capacity 274 250 - 450 ug/dL   UIBC 213 086 - 578 ug/dL   Iron 94 27 - 469 ug/dL   Iron Saturation 34 15 - 55 %   Ferritin 114 15 - 150 ng/mL        Assessment & Plan:   Problem List Items Addressed This Visit       Cardiovascular and Mediastinum   HTN (hypertension) - Primary   Other Visit Diagnoses       Varicose veins of both lower extremities without ulcer or inflammation            Assessment and Plan    Hypertension Patient has a history of hypertension and is not currently on antihypertensive medication. Recent home blood pressure reading was 145/90, but previous readings in various healthcare settings have been within normal limits. -Advise patient to continue monitoring blood pressure at home, particularly during upcoming travel. Blood Pressure: 134/82 mmHg (10/17/2023) Blood Pressure: 120/84 mmHg (10/14/2023) Blood Pressure: 121/71 mmHg Blood Pressure: 128/76 mmHg (08/2023)  Chronic Kidney Disease Patient reports recent drop in kidney function -Recommend follow-up with nephrologist for further evaluation and management.  Dermatologic Concern Patient reports new bumps on the leg, likely related to varicose veins. -Advise patient that if the veins become more prominent, laser treatment could be considered.        Follow up  plan: Return if symptoms worsen or fail to improve.

## 2023-10-27 NOTE — Telephone Encounter (Signed)
 Copied from CRM [id number]. Topic: General - Other >> Oct 27, 2023  9:52 AM Carlatta H wrote: Reason for CRM: Patient needs to be scheduled for a nurse visit//blood pressure check is needed due to kidney issues//Patient would like the 10:45 today//Unable to schedule due to error

## 2023-11-06 ENCOUNTER — Ambulatory Visit: Payer: Medicare Other

## 2023-11-06 DIAGNOSIS — Z0000 Encounter for general adult medical examination without abnormal findings: Secondary | ICD-10-CM | POA: Diagnosis not present

## 2023-11-06 NOTE — Progress Notes (Signed)
 Subjective:   Melissa [name] is a 73 y.o. who presents for a Medicare Wellness preventive visit.  Visit Complete: Virtual I connected with  Melissa [name] on 11/06/23 by a audio enabled telemedicine application and verified that I am speaking with the correct person using two identifiers.  Patient Location: Home  Provider Location: Office/Clinic  I discussed the limitations of evaluation and management by telemedicine. The patient expressed understanding and agreed to proceed.  Vital Signs: Because this visit was a virtual/telehealth visit, some criteria may be missing or patient reported. Any vitals not documented were not able to be obtained and vitals that have been documented are patient reported.  VideoDeclined- This patient declined Librarian, academic. Therefore the visit was completed with audio only.  Persons Participating in Visit: Patient.  AWV Questionnaire: No: Patient Medicare AWV questionnaire was not completed prior to this visit.  Cardiac Risk Factors include: advanced age (>42men, >48 women);dyslipidemia;hypertension;obesity (BMI >30kg/m2)     Objective:    Today's Vitals   11/06/23 1020  PainSc: 3    There is no height or weight on file to calculate BMI.     11/06/2023   10:25 AM 04/14/2023   10:15 AM 01/20/2023   12:46 PM 01/17/2023   12:22 PM 10/31/2022   11:52 AM 10/23/2021    1:51 PM 10/29/2020    2:52 PM  Advanced Directives  Does Patient Have a Medical Advance Directive? No No No No No;Yes Yes No  Type of Dispensing optician of [place];Living will   Copy of Healthcare Power of Attorney in Chart?      No - copy requested   Would patient like information on creating a medical advance directive? No - Patient declined No - Patient declined  No - Guardian declined   No - Patient declined    Current Medications (verified) Outpatient Encounter Medications as of 11/06/2023  Medication Sig   acetaminophen  (TYLENOL) 650 MG CR tablet Take 1,300 mg by mouth every 8 (eight) hours as needed for pain.   albuterol (VENTOLIN HFA) 108 (90 Base) MCG/ACT inhaler INHALE 1-2 PUFFS BY MOUTH EVERY 6 HOURS AS NEEDED FOR WHEEZE OR SHORTNESS OF BREATH   Ascorbic Acid (VITAMIN C) 1000 MG tablet Take 1,000 mg by mouth daily.   aspirin EC 81 MG tablet Take 1 tablet by mouth daily.   atorvastatin (LIPITOR) 40 MG tablet Take 1 tablet (40 mg total) by mouth daily.   azelastine (ASTELIN) 0.1 % nasal spray Place 2 sprays into both nostrils 2 (two) times daily. Use in each nostril as directed   calcium carbonate (TUMS - DOSED IN MG ELEMENTAL CALCIUM) 500 MG chewable tablet Chew 500-1,000 mg by mouth daily as needed for indigestion or heartburn.   Cholecalciferol (D 1000) 25 MCG (1000 UT) capsule Take 1,000 Units by mouth daily.   ezetimibe (ZETIA) 10 MG tablet TAKE 1 TABLET BY MOUTH EVERY DAY   fluticasone (FLONASE) 50 MCG/ACT nasal spray SPRAY 2 SPRAYS INTO EACH NOSTRIL EVERY DAY   Fluticasone-Umeclidin-Vilant (TRELEGY ELLIPTA) 200-62.5-25 MCG/ACT AEPB INHALE ONE ACTUATION INTO THE LUNGS DAILY.   gabapentin (NEURONTIN) 100 MG capsule Take 100-200 mg by mouth 2 (two) times daily. One in am and two at night   JARDIANCE 10 MG TABS tablet Take 10 mg by mouth daily.   levocetirizine (XYZAL ALLERGY 24HR) 5 MG tablet Take 1 tablet (5 mg total) by mouth every evening.   magnesium oxide (MAG-OX)  400 MG tablet Take 400 mg by mouth daily.   Multiple Vitamins-Minerals (CENTRUM SILVER) tablet Take 1 tablet by mouth daily.   nortriptyline (PAMELOR) 10 MG capsule Take 10-20 mg by mouth at bedtime.   Probiotic Product (FORTIFY PROBIOTIC WOMENS EX ST PO) Take by mouth daily.   propranolol (INDERAL) 10 MG tablet Take 1 tablet (10 mg total) by mouth 3 (three) times daily as needed. For sustained fast heart rate   zinc gluconate 50 MG tablet Take 50 mg by mouth daily.   No facility-administered encounter medications on file as of 11/06/2023.     Allergies (verified) Shellfish allergy   History: Past Medical History:  Diagnosis Date   Aortic atherosclerosis (HCC)    Asthma    Bruising    Cardiomyopathy (HCC)    Celiac artery aneurysm (HCC)    Cervical radiculopathy    CKD (chronic kidney disease), stage III (HCC)    Deficiency of vitamin B    Depression    Edema    Flank pain    Gross hematuria    History of 2019 novel coronavirus disease (COVID-19) 05/01/2019   History of bariatric surgery    HLD (hyperlipidemia)    HTN (hypertension)    Interstitial lung disease (HCC)    Iron deficiency anemia    Leukopenia    OA (osteoarthritis)    Obesity    OSA on CPAP    Osteopenia    Perennial allergic rhinitis    Pneumonia due to COVID-19 virus 2020   Renal cysts, acquired, bilateral    RLS (restless legs syndrome)    Sarcoidosis    Sickle cell trait (HCC)    Snoring    Spinal stenosis    Trigeminal neuralgia    Past Surgical History:  Procedure Laterality Date   BARIATRIC SURGERY     CESAREAN SECTION     3 or more   COLONOSCOPY WITH PROPOFOL N/A 03/15/2019   Procedure: COLONOSCOPY WITH PROPOFOL;  Surgeon: Toney Reil, MD;  Location: [facility] ENDOSCOPY;  Service: Gastroenterology;  Laterality: N/A;   COLONOSCOPY WITH PROPOFOL N/A 03/15/2022   Procedure: COLONOSCOPY WITH PROPOFOL;  Surgeon: Toney Reil, MD;  Location: [facility] ENDOSCOPY;  Service: Gastroenterology;  Laterality: N/A;   GASTRIC BYPASS     KNEE ARTHROPLASTY Right 10/18/2020   Procedure: COMPUTER ASSISTED TOTAL KNEE ARTHROPLASTY;  Surgeon: Donato Heinz, MD;  Location: [facility] ORS;  Service: Orthopedics;  Laterality: Right;   LEFT HEART CATH AND CORONARY ANGIOGRAPHY N/A 01/20/2023   Procedure: LEFT HEART CATH AND CORONARY ANGIOGRAPHY;  Surgeon: Iran Ouch, MD;  Location: [facility] INVASIVE CV LAB;  Service: Cardiovascular;  Laterality: N/A;   SHOULDER ARTHROSCOPY Right    TUBAL LIGATION     Family History  Problem Relation Age of Onset    Hypercholesterolemia Mother    Heart disease Mother    Hypertension Mother    Alcohol abuse Father    Lung cancer Brother    Alcohol abuse Brother    Diabetes Mellitus II Sister    Hypertension Maternal Grandmother    Colon cancer Sister    Breast cancer Neg Hx    Prostate cancer Neg Hx    Bladder Cancer Neg Hx    Kidney cancer Neg Hx    Social History   Socioeconomic History   Marital status: Widowed    Spouse name: Korine Winton   Number of children: 4   Years of education: Not on file   Highest education level: Some  college, no degree  Occupational History    Comment: retired  Tobacco Use   Smoking status: Never   Smokeless tobacco: Never   Tobacco comments:    Second hand smoke exposure  Vaping Use   Vaping status: Never Used  Substance and Sexual Activity   Alcohol use: Not Currently    Alcohol/week: 0.0 standard drinks of alcohol   Drug use: No   Sexual activity: Yes    Partners: Male    Birth control/protection: Post-menopausal  Other Topics Concern   Not on file  Social History Narrative   Pt's son lives with her   Social Drivers of Health   Financial Resource Strain: Low Risk  (11/06/2023)   Overall Financial Resource Strain (CARDIA)    Difficulty of Paying Living Expenses: Not hard at all  Food Insecurity: No Food Insecurity (11/06/2023)   Hunger Vital Sign    Worried About Running Out of Food in the Last Year: Never true    Ran Out of Food in the Last Year: Never true  Transportation Needs: No Transportation Needs (11/06/2023)   PRAPARE - Administrator, Civil Service (Medical): No    Lack of Transportation (Non-Medical): No  Physical Activity: Sufficiently Active (11/06/2023)   Exercise Vital Sign    Days of Exercise per Week: 4 days    Minutes of Exercise per Session: 50 min  Stress: No Stress Concern Present (11/06/2023)   [employer] of Occupational Health - Occupational Stress Questionnaire    Feeling of Stress : Not at all   Social Connections: Socially Isolated (11/06/2023)   Social Connection and Isolation Panel [NHANES]    Frequency of Communication with Friends and Family: More than three times a week    Frequency of Social Gatherings with Friends and Family: More than three times a week    Attends Religious Services: Never    Database administrator or Organizations: No    Attends Banker Meetings: Never    Marital Status: Widowed    Tobacco Counseling Counseling given: Not Answered Tobacco comments: Second hand smoke exposure    Clinical Intake:  Pre-visit preparation completed: Yes  Pain : 0-10 Pain Score: 3  Pain Type: Chronic pain Pain Location: Back Pain Orientation: Lower Pain Descriptors / Indicators: Aching, Dull, Constant Pain Onset: More than a month ago Pain Frequency: Constant     Nutritional Status: BMI > 30  Obese Nutritional Risks: None Diabetes: No  Lab Results  Component Value Date   HGBA1C 5.9 (H) 01/17/2023   HGBA1C 5.5 07/29/2019   HGBA1C 5.4 02/24/2018     How often do you need to have someone help you when you read instructions, pamphlets, or other written materials from your doctor or pharmacy?: 1 - Never  Interpreter Needed?: No  Information entered by :: Kennedy Bucker, LPN   Activities of Daily Living     11/06/2023   10:26 AM 03/25/2023    9:49 AM  In your present state of health, do you have any difficulty performing the following activities:  Hearing? 0 0  Vision? 0 0  Difficulty concentrating or making decisions? 0 0  Walking or climbing stairs? 1 0  Comment SOMETIMES- KNEE PAIN   Dressing or bathing? 0 0  Doing errands, shopping? 0 0  Preparing Food and eating ? N   Using the Toilet? N   In the past six months, have you accidently leaked urine? N   Do you have problems with loss  of bowel control? N   Managing your Medications? N   Managing your Finances? N   Housekeeping or managing your Housekeeping? N     Patient Care  Team: Alba Cory, MD as PCP - General (Family Medicine) Midge Minium, MD as Consulting Physician (Gastroenterology) Merri Ray, MD as Referring Physician (Physical Medicine and Rehabilitation) Mariah Milling, Tollie Pizza, MD as Consulting Physician (Cardiology) Hooten, Illene Labrador, MD (Orthopedic Surgery) Bevelyn Ngo, NP as Nurse Practitioner (Pulmonary Disease) Lamont Dowdy, MD as Consulting Physician (Nephrology) Christen Butter, MD as Referring Physician (Pulmonary Disease) Erin Fulling, MD as Consulting Physician (Pulmonary Disease) Pa, [facility] (Optometry)  Indicate any recent Medical Services you may have received from other than [facility] providers in the past year (date may be approximate).     Assessment:   This is a routine wellness examination for [place].  Hearing/Vision screen Hearing Screening - Comments:: NO AIDS Vision Screening - Comments:: WEARS GLASSES ALL THE TIME-  EYE   Goals Addressed             This Visit's Progress    DIET - EAT MORE FRUITS AND VEGETABLES         Depression Screen     11/06/2023   10:24 AM 08/27/2023   10:09 AM 04/28/2023    3:16 PM 03/25/2023    9:49 AM 03/14/2023    1:38 PM 11/19/2022   11:01 AM 10/31/2022   11:44 AM  PHQ 2/9 Scores  PHQ - 2 Score 0 0 0 0 0 0 0  PHQ- 9 Score 0 0 2 0 0 1 1    Fall Risk     11/06/2023   10:26 AM 08/27/2023   10:06 AM 04/14/2023   10:11 AM 03/25/2023    9:48 AM 03/14/2023    1:38 PM  Fall Risk   Falls in the past year? 0 0 0 0 0  Number falls in past yr: 0 0 0  0  Injury with Fall? 0 0 0  0  Risk for fall due to : No Fall Risks No Fall Risks No Fall Risks No Fall Risks No Fall Risks  Follow up Falls prevention discussed;Falls evaluation completed Falls prevention discussed;Education provided;Falls evaluation completed Falls evaluation completed;Education provided;Falls prevention discussed Falls prevention discussed Falls prevention discussed    MEDICARE RISK AT HOME:  Medicare  Risk at Home Any stairs in or around the home?: Yes If so, are there any without handrails?: No Home free of loose throw rugs in walkways, pet beds, electrical cords, etc?: Yes Adequate lighting in your home to reduce risk of falls?: Yes Life alert?: No Use of a cane, walker or w/c?: No Grab bars in the bathroom?: No Shower chair or bench in shower?: Yes Elevated toilet seat or a handicapped toilet?: No  TIMED UP AND GO:  Was the test performed?  No  Cognitive Function: 6CIT completed        11/06/2023   10:28 AM 10/31/2022   11:57 AM  6CIT Screen  What Year? 0 points 0 points  What month? 0 points 0 points  What time? 0 points 0 points  Count back from 20 0 points 0 points  Months in reverse 0 points 0 points  Repeat phrase 0 points 0 points  Total Score 0 points 0 points    Immunizations Immunization History  Administered Date(s) Administered   Fluad Quad(high Dose 65+) 07/01/2019, 07/20/2020   Influenza, High Dose Seasonal PF 07/02/2018, 06/11/2021, 06/16/2023  Influenza,inj,Quad PF,6+ Mos 06/13/2015, 08/21/2016, 05/07/2017   PFIZER Comirnaty(Gray Top)Covid-19 Tri-Sucrose Vaccine 12/20/2020   PFIZER(Purple Top)SARS-COV-2 Vaccination 10/02/2019, 10/23/2019, 05/12/2020   Pfizer Covid-19 Vaccine Bivalent Booster 50yrs & up 08/21/2021   Pfizer(Comirnaty)Fall Seasonal Vaccine 12 years and older 06/16/2023   Pneumococcal Conjugate-13 02/24/2018   Pneumococcal Polysaccharide-23 01/26/2020   Tdap 04/29/2011   Zoster Recombinant(Shingrix) 02/24/2018, 10/29/2018   Zoster, Live 08/21/2016    Screening Tests Health Maintenance  Topic Date Due   DEXA SCAN  09/09/2023   COVID-19 Vaccine (7 - Pfizer risk 2024-25 season) 12/15/2023   Medicare Annual Wellness (AWV)  11/05/2024   MAMMOGRAM  12/09/2024   Colonoscopy  03/15/2025   Pneumonia Vaccine 75+ Years old  Completed   INFLUENZA VACCINE  Completed   Hepatitis C Screening  Completed   Zoster Vaccines- Shingrix   Completed   HPV VACCINES  Aged Out   DTaP/Tdap/Td  Discontinued    Health Maintenance  Health Maintenance Due  Topic Date Due   DEXA SCAN  09/09/2023   Health Maintenance Items Addressed: NEEDS TDAP, GOING TO SCHEDULE MAMMOGRAM AND BONE DENSITY SCAN (HAS ORDER ALREADY), UP TO DATE ON COLONOSCOPY  Additional Screening:  Vision Screening: Recommended annual ophthalmology exams for early detection of glaucoma and other disorders of the eye.  Dental Screening: Recommended annual dental exams for proper oral hygiene  Community Resource Referral / Chronic Care Management: CRR required this visit?  No   CCM required this visit?  No     Plan:     I have personally reviewed and noted the following in the patient's chart:   Medical and social history Use of alcohol, tobacco or illicit drugs  Current medications and supplements including opioid prescriptions. Patient is not currently taking opioid prescriptions. Functional ability and status Nutritional status Physical activity Advanced directives List of other physicians Hospitalizations, surgeries, and ER visits in previous 12 months Vitals Screenings to include cognitive, depression, and falls Referrals and appointments  In addition, I have reviewed and discussed with patient certain preventive protocols, quality metrics, and best practice recommendations. A written personalized care plan for preventive services as well as general preventive health recommendations were provided to patient.     Hal Hope, LPN   [id number]   After Visit Summary: (MyChart) Due to this being a telephonic visit, the after visit summary with patients personalized plan was offered to patient via MyChart   Notes: Nothing significant to report at this time.

## 2023-11-06 NOTE — Patient Instructions (Addendum)
 Melissa [name] , Thank you for taking time to come for your Medicare Wellness Visit. I appreciate your ongoing commitment to your health goals. Please review the following plan we discussed and let me know if I can assist you in the future.   Referrals/Orders/Follow-Ups/Clinician Recommendations: none  This is a list of the screening recommended for you and due dates:  Health Maintenance  Topic Date Due   DEXA scan (bone density measurement)  09/09/2023   COVID-19 Vaccine (7 - Pfizer risk 2024-25 season) 12/15/2023   Medicare Annual Wellness Visit  11/05/2024   Mammogram  12/09/2024   Colon Cancer Screening  03/15/2025   Pneumonia Vaccine  Completed   Flu Shot  Completed   Hepatitis C Screening  Completed   Zoster (Shingles) Vaccine  Completed   HPV Vaccine  Aged Out   DTaP/Tdap/Td vaccine  Discontinued    Advanced directives: (ACP Link)Information on Advanced Care Planning can be found at [facility] Advance Health Care Directives Advance Health Care Directives. [email or web address]   Next Medicare Annual Wellness Visit scheduled for next year: Yes   11/11/24 @ 9:30 AM BY PHONE

## 2023-11-19 DIAGNOSIS — F411 Generalized anxiety disorder: Secondary | ICD-10-CM | POA: Diagnosis not present

## 2023-12-15 ENCOUNTER — Ambulatory Visit
Admission: RE | Admit: 2023-12-15 | Discharge: 2023-12-15 | Disposition: A | Discharge status: Home / Self Care | Source: Ambulatory Visit | Attending: Family Medicine | Admitting: Family Medicine

## 2023-12-15 ENCOUNTER — Encounter: Payer: Self-pay | Admitting: Family Medicine

## 2023-12-15 DIAGNOSIS — N1832 Chronic kidney disease, stage 3b: Secondary | ICD-10-CM | POA: Diagnosis not present

## 2023-12-15 DIAGNOSIS — Z1231 Encounter for screening mammogram for malignant neoplasm of breast: Secondary | ICD-10-CM | POA: Diagnosis not present

## 2023-12-15 DIAGNOSIS — N2581 Secondary hyperparathyroidism of renal origin: Secondary | ICD-10-CM

## 2023-12-15 DIAGNOSIS — M8589 Other specified disorders of bone density and structure, multiple sites: Secondary | ICD-10-CM | POA: Diagnosis not present

## 2023-12-15 DIAGNOSIS — E2839 Other primary ovarian failure: Secondary | ICD-10-CM

## 2023-12-15 DIAGNOSIS — Z780 Asymptomatic menopausal state: Secondary | ICD-10-CM | POA: Diagnosis not present

## 2023-12-17 DIAGNOSIS — F411 Generalized anxiety disorder: Secondary | ICD-10-CM | POA: Diagnosis not present

## 2023-12-19 ENCOUNTER — Ambulatory Visit (INDEPENDENT_AMBULATORY_CARE_PROVIDER_SITE_OTHER): Payer: Medicare Other | Admitting: Vascular Surgery

## 2023-12-19 ENCOUNTER — Ambulatory Visit (INDEPENDENT_AMBULATORY_CARE_PROVIDER_SITE_OTHER): Payer: Medicare Other

## 2023-12-19 VITALS — BP 136/86 | HR 73 | Resp 17 | Ht 72.0 in | Wt 200.4 lb

## 2023-12-19 DIAGNOSIS — I728 Aneurysm of other specified arteries: Secondary | ICD-10-CM

## 2023-12-19 DIAGNOSIS — N1832 Chronic kidney disease, stage 3b: Secondary | ICD-10-CM

## 2023-12-19 DIAGNOSIS — I10 Essential (primary) hypertension: Secondary | ICD-10-CM | POA: Diagnosis not present

## 2023-12-19 DIAGNOSIS — E785 Hyperlipidemia, unspecified: Secondary | ICD-10-CM | POA: Diagnosis not present

## 2023-12-19 NOTE — Assessment & Plan Note (Signed)
 A mesenteric duplex was performed today.  This measured the celiac artery at 1.49 cm in maximal diameter.  There were normal flow velocities in the celiac artery, superior mesenteric artery, and inferior mesenteric artery.  No need for repair at this time but we will continue to follow this on an annual basis with duplex.  Blood pressure control and continued abstinence from tobacco are important to avoid aneurysmal growth.  Contact our office with any problems in the interim.

## 2023-12-19 NOTE — Progress Notes (Signed)
 MRN : [id number]  Melissa [name] is a 73 y.o. ([birth year]) female who presents with chief complaint of  Chief Complaint  Patient presents with   Venous Insufficiency  .  History of Present Illness: Patient returns today in follow up of her celiac artery aneurysm.  This was seen on 2 CT scans from last year and measured approximately 1.5 cm in maximal diameter.  She denies any symptoms of postprandial abdominal pain, food fear, or weight loss.  No signs of peripheral embolization or severe abdominal pain worrisome for aneurysmal symptoms.  A mesenteric duplex was performed today.  This measured the celiac artery at 1.49 cm in maximal diameter.  There were normal flow velocities in the celiac artery, superior mesenteric artery, and inferior mesenteric artery.  Current Outpatient Medications  Medication Sig Dispense Refill   acetaminophen  (TYLENOL ) 650 MG CR tablet Take 1,300 mg by mouth every 8 (eight) hours as needed for pain.     albuterol  (VENTOLIN  HFA) 108 (90 Base) MCG/ACT inhaler INHALE 1-2 PUFFS BY MOUTH EVERY 6 HOURS AS NEEDED FOR WHEEZE OR SHORTNESS OF BREATH 18 each 0   Ascorbic Acid  (VITAMIN C ) 1000 MG tablet Take 1,000 mg by mouth daily.     aspirin  EC 81 MG tablet Take 1 tablet by mouth daily.     atorvastatin  (LIPITOR) 40 MG tablet Take 1 tablet (40 mg total) by mouth daily. 90 tablet 1   azelastine  (ASTELIN ) 0.1 % nasal spray Place 2 sprays into both nostrils 2 (two) times daily. Use in each nostril as directed 30 mL 12   calcium  carbonate (TUMS - DOSED IN MG ELEMENTAL CALCIUM ) 500 MG chewable tablet Chew 500-1,000 mg by mouth daily as needed for indigestion or heartburn.     Cholecalciferol (D 1000) 25 MCG (1000 UT) capsule Take 1,000 Units by mouth daily.     ezetimibe  (ZETIA ) 10 MG tablet TAKE 1 TABLET BY MOUTH EVERY DAY 90 tablet 1   fluticasone  (FLONASE ) 50 MCG/ACT nasal spray SPRAY 2 SPRAYS INTO EACH NOSTRIL EVERY DAY 48 mL 1   Fluticasone -Umeclidin-Vilant (TRELEGY  ELLIPTA) 200-62.5-25 MCG/ACT AEPB INHALE ONE ACTUATION INTO THE LUNGS DAILY. 60 each 3   gabapentin  (NEURONTIN ) 100 MG capsule Take 100-200 mg by mouth 2 (two) times daily. One in am and two at night     JARDIANCE 10 MG TABS tablet Take 10 mg by mouth daily.     levocetirizine (XYZAL  ALLERGY 24HR) 5 MG tablet Take 1 tablet (5 mg total) by mouth every evening. 30 tablet 6   magnesium  oxide (MAG-OX) 400 MG tablet Take 400 mg by mouth daily.     Multiple Vitamins-Minerals (CENTRUM SILVER ) tablet Take 1 tablet by mouth daily. 30 tablet 0   nortriptyline  (PAMELOR ) 10 MG capsule Take 10-20 mg by mouth at bedtime.     Probiotic Product (FORTIFY PROBIOTIC WOMENS EX ST PO) Take by mouth daily.     propranolol  (INDERAL ) 10 MG tablet Take 1 tablet (10 mg total) by mouth 3 (three) times daily as needed. For sustained fast heart rate 60 tablet 3   zinc  gluconate 50 MG tablet Take 50 mg by mouth daily.     No current facility-administered medications for this visit.    Past Medical History:  Diagnosis Date   Aortic atherosclerosis (HCC)    Asthma    Bruising    Cardiomyopathy (HCC)    Celiac artery aneurysm (HCC)    Cervical radiculopathy    CKD (chronic kidney disease), stage  III [facility])    Deficiency of vitamin B    Depression    Edema    Flank pain    Gross hematuria    History of 2019 novel coronavirus disease (COVID-19) 05/01/2019   History of bariatric surgery    HLD (hyperlipidemia)    HTN (hypertension)    Interstitial lung disease (HCC)    Iron deficiency anemia    Leukopenia    OA (osteoarthritis)    Obesity    OSA on CPAP    Osteopenia    Perennial allergic rhinitis    Pneumonia due to COVID-19 virus 2020   Renal cysts, acquired, bilateral    RLS (restless legs syndrome)    Sarcoidosis    Sickle cell trait (HCC)    Snoring    Spinal stenosis    Trigeminal neuralgia     Past Surgical History:  Procedure Laterality Date   BARIATRIC SURGERY     CESAREAN SECTION     3 or  more   COLONOSCOPY WITH PROPOFOL  N/A 03/15/2019   Procedure: COLONOSCOPY WITH PROPOFOL ;  Surgeon: Selena Daily, MD;  Location: [facility] ENDOSCOPY;  Service: Gastroenterology;  Laterality: N/A;   COLONOSCOPY WITH PROPOFOL  N/A 03/15/2022   Procedure: COLONOSCOPY WITH PROPOFOL ;  Surgeon: Selena Daily, MD;  Location: [facility] ENDOSCOPY;  Service: Gastroenterology;  Laterality: N/A;   GASTRIC BYPASS     KNEE ARTHROPLASTY Right 10/18/2020   Procedure: COMPUTER ASSISTED TOTAL KNEE ARTHROPLASTY;  Surgeon: Arlyne Lame, MD;  Location: [facility] ORS;  Service: Orthopedics;  Laterality: Right;   LEFT HEART CATH AND CORONARY ANGIOGRAPHY N/A 01/20/2023   Procedure: LEFT HEART CATH AND CORONARY ANGIOGRAPHY;  Surgeon: Wenona Hamilton, MD;  Location: [facility] INVASIVE CV LAB;  Service: Cardiovascular;  Laterality: N/A;   SHOULDER ARTHROSCOPY Right    TUBAL LIGATION       Social History   Tobacco Use   Smoking status: Never   Smokeless tobacco: Never   Tobacco comments:    Second hand smoke exposure  Vaping Use   Vaping status: Never Used  Substance Use Topics   Alcohol use: Not Currently    Alcohol/week: 0.0 standard drinks of alcohol   Drug use: No      Family History  Problem Relation Age of Onset   Hypercholesterolemia Mother    Heart disease Mother    Hypertension Mother    Alcohol abuse Father    Lung cancer Brother    Alcohol abuse Brother    Diabetes Mellitus II Sister    Hypertension Maternal Grandmother    Colon cancer Sister    Breast cancer Neg Hx    Prostate cancer Neg Hx    Bladder Cancer Neg Hx    Kidney cancer Neg Hx      Allergies  Allergen Reactions   Shellfish Allergy     Edema     REVIEW OF SYSTEMS (Negative unless checked)   Constitutional: [] Weight loss  [] Fever  [] Chills Cardiac: [] Chest pain   [] Chest pressure   [] Palpitations   [] Shortness of breath when laying flat   [] Shortness of breath at rest   [] Shortness of breath with exertion. Vascular:   [] Pain in legs with walking   [] Pain in legs at rest   [] Pain in legs when laying flat   [] Claudication   [] Pain in feet when walking  [] Pain in feet at rest  [] Pain in feet when laying flat   [] History of DVT   [] Phlebitis   [x] Swelling in legs   []   Varicose veins   [] Non-healing ulcers Pulmonary:   [] Uses home oxygen    [] Productive cough   [] Hemoptysis   [] Wheeze  [] COPD   [] Asthma Neurologic:  [] Dizziness  [] Blackouts   [] Seizures   [] History of stroke   [] History of TIA  [] Aphasia   [] Temporary blindness   [] Dysphagia   [] Weakness or numbness in arms   [] Weakness or numbness in legs Musculoskeletal:  [] Arthritis   [] Joint swelling   [] Joint pain   [x] Low back pain Hematologic:  [] Easy bruising  [] Easy bleeding   [] Hypercoagulable state   [x] Anemic  [] Hepatitis Gastrointestinal:  [] Blood in stool   [] Vomiting blood  [] Gastroesophageal reflux/heartburn   [] Abdominal pain Genitourinary:  [] Chronic kidney disease   [] Difficult urination  [] Frequent urination  [] Burning with urination   [] Hematuria Skin:  [] Rashes   [] Ulcers   [] Wounds Psychological:  [] History of anxiety   [x]  History of major depression.  Physical Examination  BP 136/86 (BP Location: Left Arm, Patient Position: Sitting, Cuff Size: Normal)   Pulse 73   Resp 17   Ht 6' (1.829 m)   Wt 200 lb 6.4 oz (90.9 kg)   BMI 27.18 kg/m  Gen:  WD/WN, NAD. Appears younger than stated age.  Head: [place]/AT, No temporalis wasting. Ear/Nose/Throat: Hearing grossly intact, nares w/o erythema or drainage Eyes: Conjunctiva clear. Sclera non-icteric Neck: Supple.  Trachea midline Pulmonary:  Good air movement, no use of accessory muscles.  Cardiac: RRR, no JVD Vascular:  Vessel Right Left  Radial Palpable Palpable                                   Gastrointestinal: soft, non-tender/non-distended. No guarding/reflex.  Musculoskeletal: M/S 5/5 throughout.  No deformity or atrophy. No edema. Neurologic: Sensation grossly intact in  extremities.  Symmetrical.  Speech is fluent.  Psychiatric: Judgment intact, Mood & affect appropriate for pt's clinical situation. Dermatologic: No rashes or ulcers noted.  No cellulitis or open wounds.      Labs No results found for this or any previous visit (from the past 2160 hours).  Radiology MM 3D SCREENING MAMMOGRAM BILATERAL BREAST Result Date: 12/17/2023 CLINICAL DATA:  Screening. EXAM: DIGITAL SCREENING BILATERAL MAMMOGRAM WITH TOMOSYNTHESIS AND CAD TECHNIQUE: Bilateral screening digital craniocaudal and mediolateral oblique mammograms were obtained. Bilateral screening digital breast tomosynthesis was performed. The images were evaluated with computer-aided detection. COMPARISON:  Previous exam(s). ACR Breast Density Category c: The breasts are heterogeneously dense, which may obscure small masses. FINDINGS: There are no findings suspicious for malignancy. IMPRESSION: No mammographic evidence of malignancy. A result letter of this screening mammogram will be mailed directly to the patient. RECOMMENDATION: Screening mammogram in one year. (Code:SM-B-01Y) BI-RADS CATEGORY  1: Negative. Electronically Signed   By: Allena Ito M.D.   On: 12/17/2023 09:24   DG Bone Density Result Date: 12/15/2023 EXAM: DUAL X-RAY ABSORPTIOMETRY (DXA) FOR BONE MINERAL DENSITY 12/15/2023 11:06 am CLINICAL DATA:  73 year old Female Postmenopausal. Screening for osteoporosis Patient is or has been on glucocorticoid therapy. TECHNIQUE: An axial (e.g., hips, spine) and/or appendicular (e.g., radius) exam was performed, as appropriate, using GE Secretary/administrator at [facility]. Images are obtained for bone mineral density measurement and are not obtained for diagnostic purposes. [id number] Exclusions: L3-L4 due to degenerative changes. COMPARISON:  09/08/2018. FINDINGS: Scan quality: Good. LUMBAR SPINE (L1-L2): BMD (in g/cm2): 1.423 T-score: 2.0 Z-score: 3.0 Rate of change from  previous exam:  No significant rate of change from previous exam. LEFT FEMORAL NECK: BMD (in g/cm2): 0.867 T-score: -1.2 Z-score: -0.4 Rate of change from previous exam: -10.1 % LEFT TOTAL HIP: BMD (in g/cm2): 0.894 T-score: -0.9 Z-score: -0.4 RIGHT FEMORAL NECK: BMD (in g/cm2): 0.928 T-score: -0.8 Z-score: 0.0 RIGHT TOTAL HIP: BMD (in g/cm2): 0.874 T-score: -1.1 Z-score: -0.5 DUAL-FEMUR TOTAL MEAN: Rate of change from previous exam: -3.4 % LEFT FOREARM (RADIUS 33%): BMD (in g/cm2): 0.746 T-score: -1.5 Z-score: -0.3 Rate of change from previous exam: -9.1 % FRAX 10-YEAR PROBABILITY OF FRACTURE: 10-year fracture risk is performed using the [employer] [facility] [employer] [facility] FRAX calculator based on patient-reported risk factors. Major osteoporotic fracture: 4.2% Hip fracture: 0.5% Other situations known to alter the reliability of the FRAX score should be considered when making treatment decisions, including chronic glucocorticoid use and past treatments. Further guidance on treatment can be found at the [facility] Osteoporosis Foundation's website [email or web address]://[email or web address]/. IMPRESSION: Osteopenia based on BMD. Fracture risk is increased. Increased risk is based on low BMD. RECOMMENDATIONS: 1. All patients should optimize calcium  and vitamin D  intake. 2. Consider FDA-approved medical therapies in postmenopausal women and men aged 66 years and older, based on the following: - A hip or vertebral (clinical or morphometric) fracture - T-score less than or equal to -2.5 and secondary causes have been excluded. - Low bone mass (T-score between -1.0 and -2.5) and a 10-year probability of a hip fracture greater than or equal to 3% or a 10-year probability of a major osteoporosis-related fracture greater than or equal to 20% based on the [place] -adapted WHO algorithm. - Clinician judgment and/or patient preferences may indicate treatment for people with 10-year fracture probabilities above or below these levels 3. Patients with diagnosis of osteoporosis  or at high risk for fracture should have regular bone mineral density tests. For patients eligible for Medicare, routine testing is allowed once every 2 years. The testing frequency can be increased to one year for patients who have rapidly progressing disease, those who are receiving or discontinuing medical therapy to restore bone mass, or have additional risk factors. Electronically Signed   By: Dina  Arceo M.D.   On: 12/15/2023 11:38    Assessment/Plan  Aneurysm of celiac artery (HCC) A mesenteric duplex was performed today.  This measured the celiac artery at 1.49 cm in maximal diameter.  There were normal flow velocities in the celiac artery, superior mesenteric artery, and inferior mesenteric artery.  No need for repair at this time but we will continue to follow this on an annual basis with duplex.  Blood pressure control and continued abstinence from tobacco are important to avoid aneurysmal growth.  Contact our office with any problems in the interim.  Hypertension, benign blood pressure control important in reducing the progression of atherosclerotic disease and aneurysmal growth. On appropriate oral medications.     Chronic kidney disease (CKD), stage III (moderate) Could certainly contribute to lower extremity swelling  HLD (hyperlipidemia) lipid control important in reducing the progression of atherosclerotic disease. Continue statin therapy  Mikki Alexander, MD  12/19/2023 9:16 AM    This note was created with Dragon medical transcription system.  Any errors from dictation are purely unintentional

## 2023-12-31 DIAGNOSIS — F411 Generalized anxiety disorder: Secondary | ICD-10-CM | POA: Diagnosis not present

## 2024-01-06 ENCOUNTER — Encounter (INDEPENDENT_AMBULATORY_CARE_PROVIDER_SITE_OTHER): Payer: Self-pay

## 2024-01-15 ENCOUNTER — Other Ambulatory Visit: Payer: Self-pay | Admitting: Family Medicine

## 2024-01-15 DIAGNOSIS — I700 Atherosclerosis of aorta: Secondary | ICD-10-CM

## 2024-01-15 DIAGNOSIS — I728 Aneurysm of other specified arteries: Secondary | ICD-10-CM

## 2024-01-19 ENCOUNTER — Ambulatory Visit (INDEPENDENT_AMBULATORY_CARE_PROVIDER_SITE_OTHER): Admitting: Family Medicine

## 2024-01-19 ENCOUNTER — Encounter: Payer: Self-pay | Admitting: Family Medicine

## 2024-01-19 ENCOUNTER — Ambulatory Visit: Payer: Self-pay

## 2024-01-19 VITALS — BP 122/94 | HR 100 | Temp 97.7°F | Resp 18 | Ht 72.0 in | Wt 198.6 lb

## 2024-01-19 DIAGNOSIS — R0982 Postnasal drip: Secondary | ICD-10-CM | POA: Diagnosis not present

## 2024-01-19 DIAGNOSIS — F439 Reaction to severe stress, unspecified: Secondary | ICD-10-CM | POA: Diagnosis not present

## 2024-01-19 DIAGNOSIS — F341 Dysthymic disorder: Secondary | ICD-10-CM

## 2024-01-19 DIAGNOSIS — I10 Essential (primary) hypertension: Secondary | ICD-10-CM

## 2024-01-19 MED ORDER — CITALOPRAM HYDROBROMIDE 10 MG PO TABS: 10.0000 mg | ORAL_TABLET | Freq: Every day | ORAL | 1 refills | Status: DC

## 2024-01-19 MED ORDER — NEBIVOLOL HCL 5 MG PO TABS
5.0000 mg | ORAL_TABLET | Freq: Every day | ORAL | 1 refills | Status: DC
Start: 2024-01-19 — End: 2024-02-26

## 2024-01-19 NOTE — Telephone Encounter (Signed)
 Copied from CRM [id number]. Topic: Clinical - Red Word Triage >> Jan 19, 2024  8:48 AM Elle L wrote: Red Word that prompted transfer to Nurse Triage: The patient states she has been experiencing a headache for a few days, body aches, and sinus drainage. The patient states she has continious pain and she feels off and something is not normal.   Chief Complaint: Headache Symptoms: Body Aches, Fatigue Frequency: One Week Pertinent Negatives: Patient denies chest pain, shortness of breath, fever, or dizziness Disposition: [] ED /[] Urgent Care (no appt availability in office) / [x] Appointment(In office/virtual)/ []  [place] [facility]/ [] Home Care/ [] Refused Recommended Disposition /[] [place] [facility]/ []  Follow-up with PCP Additional Notes: Triage call received on Date: @TODAY @ Patient/caregiver reports concerns of Headache  Assessment conducted based on subjective report and clinical judgment within nursing scope. The patient recently went on a trip, and thought it was her sinuses initially. Notes the fatigue is excessive. PB reports that her headache and other symptoms started about a week ago and she has only been able to take Flonase  and Tylenol  for her symptoms. Patient advised based on reported symptoms, risk factors, and urgency of presentation. Disposition determined based on symptom severity, acuity, and patient-specific risk factors.   Reason for Disposition  [1] MILD-MODERATE headache AND [2] present > 72 hours  Answer Assessment - Initial Assessment Questions 1. LOCATION: "Where does it hurt?"      Top  2. ONSET: "When did the headache start?" (Minutes, hours or days)      One Week  3. PATTERN: "Does the pain come and go, or has it been constant since it started?"    Constant  4. SEVERITY: "How bad is the pain?" and "What does it keep you from doing?"  (e.g., Scale 1-10; mild, moderate, or severe)   - MILD (1-3): doesn't interfere with normal activities    - MODERATE (4-7):  interferes with normal activities or awakens from sleep    - SEVERE (8-10): excruciating pain, unable to do any normal activities        None  5. RECURRENT SYMPTOM: "Have you ever had headaches before?" If Yes, ask: "When was the last time?" and "What happened that time?"      No  6. CAUSE: "What do you think is causing the headache?"     Unsure  7. MIGRAINE: "Have you been diagnosed with migraine headaches?" If Yes, ask: "Is this headache similar?"      No  8. HEAD INJURY: "Has there been any recent injury to the head?"      No  9. OTHER SYMPTOMS: "Do you have any other symptoms?" (fever, stiff neck, eye pain, sore throat, cold symptoms)     Arm Pain, Thigh-Hip Area, Fatigue  10. PREGNANCY: "Is there any chance you are pregnant?" "When was your last menstrual period?"       No and No  Protocols used: Headache-A-AH

## 2024-01-19 NOTE — Progress Notes (Signed)
 Name: Melissa [name]   MRN: [id number]    DOB: 1950-09-20   Date:01/19/2024       Progress Note  Subjective  Chief Complaint  Chief Complaint  Patient presents with   Headache   Fatigue    HPI   She has been feeling tired, some headache, post nasal drip, fatigue. She just returned from [place]  5 days ago, symptoms started one day later. She has enough energy to work out. No fever however has been feeling hot. Denies sore throat or rashes. She states headache was 5/10 but resolved now   HTN she also has cardiomyopathy and episodes of tachycardia . She is currently not taking bp medication only Jardiance for CKI stage IIIb  . She has inderal  to take prn only, heart rate is up today.   Stress: she has more stress with family members, she has a history of depression , denies anxiety but seems to keep things in. She states her kids will be upset that their cousin is stressing her out   Patient Active Problem List   Diagnosis Date Noted   Interstitial lung disease due to granulomatous disease (HCC) 03/25/2023   Cardiomyopathy, unspecified type (HCC) 03/25/2023   HTN (hypertension) 01/17/2023   HLD (hyperlipidemia) 01/17/2023   Unstable angina (HCC) 01/17/2023   Polyp of transverse colon    Total knee replacement status 10/18/2020   Primary osteoarthritis of right hip 08/06/2020   Sarcoidosis of lung (HCC) 05/29/2020   Aneurysm of celiac artery (HCC) 02/11/2020   Localized swelling of right lower leg 09/06/2019   OSA on CPAP 09/06/2019   Secondary hyperparathyroidism of renal origin (HCC) 07/21/2019   Anemia in chronic kidney disease 06/14/2019   Benign hypertensive kidney disease with chronic kidney disease 06/14/2019   Sickle cell trait (HCC) 06/14/2019   Stage 3b chronic kidney disease (HCC) 06/14/2019   Chronic bronchitis (HCC) 05/19/2019   Chronic venous insufficiency 04/15/2019   Lymphedema 04/15/2019   Family history of colon cancer    Trigeminal neuralgia 09/11/2018    Low back pain without sciatica 02/21/2017   Renal cyst 10/18/2016   Atherosclerosis of aorta (HCC) 10/16/2016   History of colonic polyps 09/25/2016   Dyslipidemia 12/08/2015   Gastroesophageal reflux disease without esophagitis 08/11/2015   Primary osteoarthritis of knee 06/13/2015   Hip bursitis 06/13/2015   RLS (restless legs syndrome) 06/13/2015   Depression, major, in remission (HCC) 06/13/2015   B12 deficiency 06/13/2015   History of Roux-en-Y gastric bypass 06/13/2015   History of iron deficiency anemia 06/13/2015   Vitamin D  deficiency 06/13/2015   Hypertension, benign 06/13/2015   Perennial allergic rhinitis with seasonal variation 06/13/2015   Primary osteoarthritis of both knees 06/13/2015   Cervical radiculitis 01/04/2014   Cervical spinal stenosis 01/04/2014   Cervical osteoarthritis 01/04/2014    Social History   Tobacco Use   Smoking status: Never   Smokeless tobacco: Never   Tobacco comments:    Second hand smoke exposure  Substance Use Topics   Alcohol use: Not Currently    Alcohol/week: 0.0 standard drinks of alcohol     Current Outpatient Medications:    acetaminophen  (TYLENOL ) 650 MG CR tablet, Take 1,300 mg by mouth every 8 (eight) hours as needed for pain., Disp: , Rfl:    albuterol  (VENTOLIN  HFA) 108 (90 Base) MCG/ACT inhaler, INHALE 1-2 PUFFS BY MOUTH EVERY 6 HOURS AS NEEDED FOR WHEEZE OR SHORTNESS OF BREATH, Disp: 18 each, Rfl: 0   Ascorbic Acid  (VITAMIN C ) 1000  MG tablet, Take 1,000 mg by mouth daily., Disp: , Rfl:    aspirin  EC 81 MG tablet, Take 1 tablet by mouth daily., Disp: , Rfl:    atorvastatin  (LIPITOR) 40 MG tablet, Take 1 tablet (40 mg total) by mouth daily., Disp: 90 tablet, Rfl: 1   calcium  carbonate (TUMS - DOSED IN MG ELEMENTAL CALCIUM ) 500 MG chewable tablet, Chew 500-1,000 mg by mouth daily as needed for indigestion or heartburn., Disp: , Rfl:    Cholecalciferol (D 1000) 25 MCG (1000 UT) capsule, Take 1,000 Units by mouth daily.,  Disp: , Rfl:    ezetimibe  (ZETIA ) 10 MG tablet, TAKE 1 TABLET BY MOUTH EVERY DAY, Disp: 90 tablet, Rfl: 0   fluticasone  (FLONASE ) 50 MCG/ACT nasal spray, SPRAY 2 SPRAYS INTO EACH NOSTRIL EVERY DAY, Disp: 48 mL, Rfl: 1   Fluticasone -Umeclidin-Vilant (TRELEGY ELLIPTA ) 200-62.5-25 MCG/ACT AEPB, INHALE ONE ACTUATION INTO THE LUNGS DAILY., Disp: 60 each, Rfl: 3   gabapentin  (NEURONTIN ) 100 MG capsule, Take 100-200 mg by mouth 2 (two) times daily. One in am and two at night, Disp: , Rfl:    JARDIANCE 10 MG TABS tablet, Take 10 mg by mouth daily., Disp: , Rfl:    levocetirizine (XYZAL  ALLERGY 24HR) 5 MG tablet, Take 1 tablet (5 mg total) by mouth every evening., Disp: 30 tablet, Rfl: 6   magnesium  oxide (MAG-OX) 400 MG tablet, Take 400 mg by mouth daily., Disp: , Rfl:    Multiple Vitamins-Minerals (CENTRUM SILVER ) tablet, Take 1 tablet by mouth daily., Disp: 30 tablet, Rfl: 0   nortriptyline  (PAMELOR ) 10 MG capsule, Take 10-20 mg by mouth at bedtime., Disp: , Rfl:    Probiotic Product (FORTIFY PROBIOTIC WOMENS EX ST PO), Take by mouth daily., Disp: , Rfl:    propranolol  (INDERAL ) 10 MG tablet, Take 1 tablet (10 mg total) by mouth 3 (three) times daily as needed. For sustained fast heart rate, Disp: 60 tablet, Rfl: 3   zinc  gluconate 50 MG tablet, Take 50 mg by mouth daily., Disp: , Rfl:   Allergies  Allergen Reactions   Shellfish Allergy     Edema    ROS  Ten systems reviewed and is negative except as mentioned in HPI    Objective  Vitals:   01/19/24 1522  BP: (!) 122/94  Pulse: 100  Resp: 18  Temp: 97.7 F (36.5 C)  SpO2: 97%  Weight: 198 lb 9.6 oz (90.1 kg)  Height: 6' (1.829 m)    Body mass index is 26.94 kg/m.     01/19/2024    3:13 PM 11/06/2023   10:24 AM 08/27/2023   10:09 AM  PHQ9 SCORE ONLY  PHQ-9 Total Score 0 0 0      Physical Exam  Constitutional: Patient appears well-developed and well-nourished. Obese  No distress.  HEENT: head atraumatic, normocephalic, pupils  equal and reactive to light, neck supple, throat within normal limits Cardiovascular: Normal rate, regular rhythm and normal heart sounds.  No murmur heard. No BLE edema. Pulmonary/Chest: Effort normal and breath sounds normal. No respiratory distress. Abdominal: Soft.  There is no tenderness. Psychiatric: Patient has a normal mood and affect. behavior is normal. Judgment and thought content normal.    Assessment & Plan   1. Primary hypertension (Primary)  - nebivolol (BYSTOLIC) 5 MG tablet; Take 1 tablet (5 mg total) by mouth daily.  Dispense: 30 tablet; Refill: 1  2. Stress at home  Continue therapist , working on boundaries with sister and nephew   3.  Dysthymia  - citalopram (CELEXA) 10 MG tablet; Take 1 tablet (10 mg total) by mouth daily.  Dispense: 30 tablet; Refill: 1  4. Post-nasal drainage  May be an URI, advised to use nasal saline.

## 2024-01-28 DIAGNOSIS — F411 Generalized anxiety disorder: Secondary | ICD-10-CM | POA: Diagnosis not present

## 2024-02-02 ENCOUNTER — Ambulatory Visit

## 2024-02-03 ENCOUNTER — Ambulatory Visit

## 2024-02-05 ENCOUNTER — Ambulatory Visit: Admitting: Internal Medicine

## 2024-02-09 ENCOUNTER — Ambulatory Visit: Payer: Medicare Other | Admitting: Internal Medicine

## 2024-02-10 ENCOUNTER — Ambulatory Visit

## 2024-02-10 DIAGNOSIS — I10 Essential (primary) hypertension: Secondary | ICD-10-CM

## 2024-02-10 NOTE — Progress Notes (Signed)
 Patient is in office today for a nurse visit for Blood Pressure Check. Patient blood pressure was 124 /64, Patient No chest pain, No shortness of breath, No dyspnea on exertion, No orthopnea, No paroxysmal nocturnal dyspnea, No edema, No palpitations, No syncope Pulse 89 HTN she also has cardiomyopathy and episodes of tachycardia . She is currently not taking bp medication only Jardiance for CKI stage IIIb  . She has inderal  to take prn only, heart rate is up today.

## 2024-02-13 DIAGNOSIS — G2581 Restless legs syndrome: Secondary | ICD-10-CM | POA: Diagnosis not present

## 2024-02-13 DIAGNOSIS — R519 Headache, unspecified: Secondary | ICD-10-CM | POA: Diagnosis not present

## 2024-02-13 DIAGNOSIS — R200 Anesthesia of skin: Secondary | ICD-10-CM | POA: Diagnosis not present

## 2024-02-13 DIAGNOSIS — Z1331 Encounter for screening for depression: Secondary | ICD-10-CM | POA: Diagnosis not present

## 2024-02-13 DIAGNOSIS — Z8669 Personal history of other diseases of the nervous system and sense organs: Secondary | ICD-10-CM | POA: Diagnosis not present

## 2024-02-13 NOTE — Progress Notes (Signed)
 Today the history is gathered from: 100% - patient  0% - patient alone   RECORDS SUMMARY: I personally reviewed records from 04/10/2022, patient visit with Dr. Wiliam.   REFERRING PHYSICIAN: Sowles, Dorette FALCON, MD PRIMARY CARE PHYSICIAN:  Glenard Dorette FALCON, MD   IMPRESSION/PLAN  Melissa [name] is a 73 y.o. female presenting for evaluation of  RESTLESS LEGS / LEG CRAMPS Assessment & Plan Leg cramps Chronic leg cramps primarily in hands and toes, occurring daily, exacerbated by certain positions and activities like crocheting. Nocturnal cramps do not disturb sleep. Current gabapentin  dosage may be insufficient for symptom control.  - Increase gabapentin  to 100mg  in the morning, 100mg  in the afternoon, and 300 mg at night. - Wean off nortriptyline  by reducing to 10 mg nightly for two weeks, then discontinue. - Ensure adequate hydration and continue magnesium  supplementation.  Sarcoidosis Sarcoidosis with no current symptoms. Regular organ checks and annual eye exams performed to monitor for sarcoidosis-related complications. No signs of sarcoidosis in recent eye exams. - Continue annual eye exams for sarcoidosis monitoring. - Request records from [facility]  Follow up 69mo  Medications previously tried:  CHIEF COMPLAINT & HPI  Melissa [name] is a 73 y.o. female presenting for evaluation of: Chief Complaint  Patient presents with  . RESTLESS LEGS / LEG CRAMPS/ HEADACHES    RESTLESS LEGS / LEG CRAMPS/ HEADACHES History of Present Illness Melissa [name] is a 73 year old female who presents for a one-year follow-up for restless legs and leg cramping.  She experiences daily leg cramps, primarily in her hands and toes, which are tolerable. The cramps no longer occur in her thighs, stomach, or neck. They often occur when she is relaxing, such as sitting on her bed or crocheting. Walking and massaging the affected areas provide temporary relief, although she is uncertain if these actions  directly alleviate the cramps or if they resolve on their own. The cramps do not wake her from sleep.  She denies experiencing restlessness in her legs, despite a previous diagnosis of restless leg syndrome by her primary care provider. She has not had recent blood work done.  She recounts an incident where she forgot to take her nighttime dose of gabapentin  while away for five days, which she believes may have led to withdrawal symptoms. She resumed her regular dosing upon returning home and felt better after two to three days. Currently, she takes gabapentin  100 mg in the morning and 200 mg at night, but she feels there is room for improvement as she continues to experience cramps. She dislikes taking medication but acknowledges the need for comfort.  She also takes nortriptyline  20 mg nightly. Her headaches are infrequent and typically sinus-related. She has not experienced any recent headaches and denies any vision changes, dizziness, or tinnitus.  Her social history includes being active at the gym four days a week, working with a Systems analyst, and participating in water aerobics to manage arthritis. She enjoys crocheting and stays well-hydrated, consuming multiple bottles of water daily. She also takes a magnesium  supplement and has recently purchased magnesium  lotion and oil, though she has only used it once.   DATA SUMMARY: 01/02/2023 MR BRAIN WO CONTRAST IMPRESSION:  1. No acute intracranial abnormality.  2. Minimal chronic small vessel ischemic disease.  3. Partially empty sella.  4. Nonspecific subcentimeter nodule or cyst in the left parotid  gland.    05/03/2022 MRI CERVICAL SPINE WO CONTRAST IMPRESSION:  1. At C5-6 there is a broad-based disc osteophyte  complex eccentric  towards the right. Bilateral uncovertebral degenerative changes.  Severe bilateral foraminal stenosis. Moderate spinal stenosis.  2. At C6-7 there is a mild broad-based disc bulge. Mild left  foraminal  stenosis. No right foraminal stenosis.  3. At C3-4 there is moderate left foraminal stenosis. Mild right  foraminal stenosis.  4. At C4-5 there is moderate right and mild left foraminal stenosis.  5. At C7-T1 there is mild left foraminal stenosis.   VISIT SUMMARIES:   MEDICATIONS Current Outpatient Medications  Medication Sig Dispense Refill  . acetaminophen  (TYLENOL ) 650 MG ER tablet Take 1,300 mg by mouth every 8 (eight) hours as needed    . albuterol  (PROVENTIL ) 2.5 mg /3 mL (0.083 %) nebulizer solution Take 2.5 mg by nebulization every 6 (six) hours as needed       . albuterol  90 mcg/actuation inhaler Inhale 2 inhalations into the lungs every 4 (four) hours as needed       . ascorbic acid , vitamin C , (VITAMIN C ) 1000 MG tablet Take 1,000 mg by mouth once daily       . aspirin  81 MG EC tablet TAKE 1 TABLET BY MOUTH EVERY DAY    . atorvastatin  (LIPITOR) 40 MG tablet Take 1 tablet by mouth once daily    . budesonide  (PULMICORT ) 0.25 mg/2 mL nebulizer solution Inhale 0.25 mg into the lungs once daily       . calcium  carbonate (TUMS) 200 mg calcium  (500 mg) chewable tablet Take 1 tablet by mouth 2 (two) times daily as needed       . ezetimibe  (ZETIA ) 10 mg tablet Take 1 tablet by mouth once daily    . fluticasone  propionate (FLONASE ) 50 mcg/actuation nasal spray SPRAY 2 SPRAYS INTO EACH NOSTRIL EVERY DAY    . fluticasone -umeclidinium-vilanterol (TRELEGY ELLIPTA ) 100-62.5-25 mcg inhaler Inhale 1 Puff into the lungs once daily    . gabapentin  (NEURONTIN ) 100 MG capsule Take 100 mg in the morning and 200 mg at night 270 capsule 3  . ipratropium-albuteroL  (DUO-NEB) nebulizer solution Inhale 3 mLs into the lungs 4 (four) times daily as needed    . JARDIANCE 10 mg tablet Take 10 mg by mouth once daily    . loratadine  (CLARITIN ) 10 mg tablet Take 10 mg by mouth once daily       . magnesium  oxide (MAG-OX) 400 mg tablet Take 400 mg by mouth once daily.    . multivitamin tablet Take 1 tablet by  mouth once daily    . nortriptyline  (PAMELOR ) 10 MG capsule TAKE 2 CAPSULES AT NIGHT FOR HEADACHES, SLEEP DIFFICULTY, CRAMPING, NECK PAIN. 180 capsule 0  . propranoloL  (INDERAL ) 10 MG tablet Take 10 mg by mouth    . ranitidine  (ZANTAC ) 150 MG tablet Take 1 tablet by mouth once daily as needed    . zinc  gluconate 50 mg tablet Take 50 mg by mouth once daily    . azelastine  (ASTELIN ) 137 mcg nasal spray Place 1 spray into both nostrils 2 (two) times daily (Patient not taking: Reported on 02/13/2024)    . cholecalciferol (VITAMIN D3) 1000 unit capsule Take 1,000 Units by mouth once daily (Patient not taking: Reported on 02/13/2024)    . ferrous sulfate  325 (65 FE) MG EC tablet Take 325 mg by mouth once daily. (Patient not taking: Reported on 02/13/2024)     No current facility-administered medications for this visit.    ALLERGIES Allergies  Allergen Reactions  . Iodinated Contrast Media Swelling and Angioedema  Added due to swelling with shellfish  . Shellfish Containing Products Swelling and Angioedema    Swelling of face, eyes, and lips       EXAM   Vitals:   02/13/24 1027  BP: 118/78  Weight: 92 kg (202 lb 12.8 oz)  Height: 182.9 cm (6')  PainSc:   3  PainLoc: Hip     Body mass index is 27.5 kg/m.  GENERAL: Pleasant female, NAD.  Normocephalic and atraumatic.  Baseline neurological exam below was obtained at prior office visit. Changes from today's visit appear in bold.   EYES: PERRLA EOM's intact  MUSCULOSKELETAL: Bulk - Normal Tone - Normal Pronator Drift - Absent bilaterally. Ambulation - Gait and station is steady  Romberg - absent  R/L 5/5    Shoulder abduction (deltoid/supraspinatus, axillary/suprascapular n, C5) 5/5    Elbow flexion (biceps brachii, musculoskeletal n, C5-6) 5/5    Elbow extension (triceps, radial n, C7) 5/5    Finger adduction (interossei, ulnar n, T1)  5/5    Hip flexion (iliopsoas, L1/L2) 5/5    Knee flexion (hamstrings, sciatic n,  L5/S1)  5/5    Knee extension (quadriceps, femoral n, L3/4) 5/5    Ankle dorsiflexion (tibialis anterior, deep fibular n, L4/5) 5/5    Ankle plantarflexion (gastroc, tibial n, S1)   NEUROLOGICAL: MENTAL STATUS: Patient is oriented to person, place and time.   Short-term memory is intact Long-term memory is intact.   Attention span and concentration are intact.   Naming and repetition are intact. Comprehension is intact.   Expressive speech is intact.   Patient's fund of knowledge is within normal limits for educational level.  CRANIAL NERVES: Visual acuity and visual fields are intact         Extraocular muscles are intact                        Facial sensation is intact bilaterally                Facial strength is intact bilaterally                   Hearing is intact bilaterally                              Palate elevates midline, normal phonation     Shoulder shrug strength is intact                    Tongue protrudes midline                       SENSATION: Pain and temperature (spinothalamic tracts) is normal. Position and vibration (dorsal columns) is normal.  COORDINATION/CEREBELLAR: Finger to nose testing is intact.    PAST MEDICAL HISTORY Past Medical History:  Diagnosis Date  . Bilateral renal cysts   . Hypertension   . Kidney stones   . Leukopenia   . Obesity   . Restless leg syndrome   . Sleep apnea   . Spinal stenosis, lumbar    Dr. Mavis  . Vitamin B12 deficiency     PAST SURGICAL HISTORY Past Surgical History:  Procedure Laterality Date  . Right total knee arthroplasty using computer-assisted navigation  10/18/2020   Dr Mardee  . CESAREAN SECTION     x3  . SHOULDER SURGERY      FAMILY HISTORY Family History  Problem  Relation Name Age of Onset  . High blood pressure (Hypertension) Other    . Diabetes Other      SOCIAL HISTORY  Social History   Tobacco Use  . Smoking status: Never  . Smokeless tobacco: Never  Vaping Use  .  Vaping status: Never Used  Substance Use Topics  . Alcohol use: Never  . Drug use: Never    REVIEW OF SYSTEMS:  13 system ROS was verbally reviewed with patient. Pertinent positives and negatives are mentioned above in the HPI and all other systems are negative.  DATA   No visits with results within 6 Month(s) from this visit.  Latest known visit with results is:  Office Visit on 04/17/2022  Component Date Value Ref Range Status  . Vitamin B12 04/17/2022 >1,500  >300 pg/mL Final  . Vitamin D , 25-Hydroxy - [facility] 04/17/2022 32.3  30.0 - 100.0 ng/mL Final  . Thyroid  Stimulating Hormone (TSH) 04/17/2022 3.186  0.450-5.330 uIU/ml uIU/mL Final  . Magnesium  04/17/2022 2.0  1.8 - 2.5 mg/dL Final    No follow-ups on file.  Payor: MEDICARE / Plan: MEDICARE A AND B / Product Type: Medicare /   This note is partially written by Roe Mater, scribe,  in the presence of and acting as the scribe of Lauraine Rocks, [name].    I agree that the scribe documentation is complete and accurate.  This note was generated in part with voice recognition software and I apologize for any typographical errors that were not detected and corrected.     Attestation Statement:   I personally performed the service, non-incident to. [facility])   SARAH ELIZABETH MASON, PA       This note has been created using automated tools and reviewed for accuracy by [facility].

## 2024-02-15 ENCOUNTER — Other Ambulatory Visit: Payer: Self-pay | Admitting: Family Medicine

## 2024-02-15 DIAGNOSIS — F341 Dysthymic disorder: Secondary | ICD-10-CM

## 2024-02-15 DIAGNOSIS — I10 Essential (primary) hypertension: Secondary | ICD-10-CM

## 2024-02-26 ENCOUNTER — Encounter: Payer: Self-pay | Admitting: Family Medicine

## 2024-02-26 ENCOUNTER — Ambulatory Visit: Payer: Self-pay | Admitting: Family Medicine

## 2024-02-26 VITALS — BP 114/74 | HR 75 | Resp 16 | Ht 72.0 in | Wt 202.1 lb

## 2024-02-26 DIAGNOSIS — J8489 Other specified interstitial pulmonary diseases: Secondary | ICD-10-CM

## 2024-02-26 DIAGNOSIS — E538 Deficiency of other specified B group vitamins: Secondary | ICD-10-CM | POA: Diagnosis not present

## 2024-02-26 DIAGNOSIS — I728 Aneurysm of other specified arteries: Secondary | ICD-10-CM | POA: Diagnosis not present

## 2024-02-26 DIAGNOSIS — N2581 Secondary hyperparathyroidism of renal origin: Secondary | ICD-10-CM | POA: Diagnosis not present

## 2024-02-26 DIAGNOSIS — J4489 Other specified chronic obstructive pulmonary disease: Secondary | ICD-10-CM | POA: Diagnosis not present

## 2024-02-26 DIAGNOSIS — D71 Functional disorders of polymorphonuclear neutrophils: Secondary | ICD-10-CM

## 2024-02-26 DIAGNOSIS — D860 Sarcoidosis of lung: Secondary | ICD-10-CM

## 2024-02-26 DIAGNOSIS — D573 Sickle-cell trait: Secondary | ICD-10-CM | POA: Diagnosis not present

## 2024-02-26 DIAGNOSIS — F325 Major depressive disorder, single episode, in full remission: Secondary | ICD-10-CM

## 2024-02-26 DIAGNOSIS — I10 Essential (primary) hypertension: Secondary | ICD-10-CM

## 2024-02-26 DIAGNOSIS — I200 Unstable angina: Secondary | ICD-10-CM

## 2024-02-26 DIAGNOSIS — I700 Atherosclerosis of aorta: Secondary | ICD-10-CM

## 2024-02-26 DIAGNOSIS — N1832 Chronic kidney disease, stage 3b: Secondary | ICD-10-CM

## 2024-02-26 DIAGNOSIS — R7989 Other specified abnormal findings of blood chemistry: Secondary | ICD-10-CM | POA: Diagnosis not present

## 2024-02-26 MED ORDER — EZETIMIBE 10 MG PO TABS: 10.0000 mg | ORAL_TABLET | Freq: Every day | ORAL | 1 refills | Status: DC

## 2024-02-26 MED ORDER — NEBIVOLOL HCL 5 MG PO TABS: 5.0000 mg | ORAL_TABLET | Freq: Every day | ORAL | 1 refills | Status: DC

## 2024-02-26 MED ORDER — ATORVASTATIN CALCIUM 40 MG PO TABS: 40.0000 mg | ORAL_TABLET | Freq: Every day | ORAL | 1 refills | Status: DC

## 2024-02-26 NOTE — Progress Notes (Signed)
 Name: Melissa [name]   MRN: [id number]    DOB: [birth year]   Date:02/26/2024       Progress Note  Subjective  Chief Complaint  Chief Complaint  Patient presents with   Medical Management of Chronic Issues   HPI   Atherosclerosis aorta and CAD with unstable angina  : found on CT chest done 2021. She has been on statin therapy and aspirin , sees cardiologist , Dr. Gollan yearly for CAD    OSA : she sees pulmonologist, wears CPAP every night., under the care of Dr. Isaiah and has an upcoming visit    Sarcoidosis/Interstitial lung disease/OSA on CPAP  and chronic asthmatic bronchitis:  currently on Trelegy but only prn  and albuterol  prn. She is under the care of Dr. Isaiah. She has been evaluated by Rheumatologist and negative for sarcoidosis outside of lung. She has occasional cough when has to start talking but no wheezing, occasional SOB with humidity .   CKI stage III b/HTN:  history of secondary hyperparathyroidism, under the care of nephrologist, Dr. Lavinia, she was off bp medication due to episode of hypotension but bp went up again and we gave her bystolic  during her last visit , bp today is at goal, no orthostatic changes     Aneurysm celiac artery/ atherosclerosis of aorta: continue statin therapy, aspirin , ecently seen by vascular surgeon, Dr. Marea. Mesenteric duplex was done in May 2025 and size was 1.49 cm and will go back next year for repeat    Sickle cell trait: asymptomatic. Unchanged    Depression Major Depression: she took medications for a while, it was present for a few years, she is in remission.  She still sees her therapist every two weeks. She goes to water aerobics and seems to be doing well. Last visit she was feeling more anxious and we gave her Celexa , however she decided to just manage her stress by using boundaries and is doing well    Angina: went to [facility] June 2024 after an episode of chest pain that was right after her water aerobic class. Cardiac cath negative but  troponin spiked a little. Seen by cardiologist since . She is now taking low dose bystolic  and bp is at goal today    Cervical radiculitis: she used to get injections by Dr. Claudie, she states lately noticing pain on right side of neck that radiates to right upper arm. She denies weakness. Stable   Muscles spasms and RLS and headache; seen by neurologist, coming off Nortriptyline  , taking gabapentin  and spasms has improved    Patient Active Problem List   Diagnosis Date Noted   Interstitial lung disease due to granulomatous disease (HCC) 03/25/2023   Cardiomyopathy, unspecified type (HCC) 03/25/2023   HTN (hypertension) 01/17/2023   HLD (hyperlipidemia) 01/17/2023   Unstable angina (HCC) 01/17/2023   Polyp of transverse colon    Total knee replacement status 10/18/2020   Primary osteoarthritis of right hip 08/06/2020   Sarcoidosis of lung (HCC) 05/29/2020   Aneurysm of celiac artery (HCC) 02/11/2020   Localized swelling of right lower leg 09/06/2019   OSA on CPAP 09/06/2019   Secondary hyperparathyroidism of renal origin (HCC) 07/21/2019   Anemia in chronic kidney disease 06/14/2019   Benign hypertensive kidney disease with chronic kidney disease 06/14/2019   Sickle cell trait (HCC) 06/14/2019   Stage 3b chronic kidney disease (HCC) 06/14/2019   Chronic bronchitis (HCC) 05/19/2019   Chronic venous insufficiency 04/15/2019   Lymphedema 04/15/2019   Family  history of colon cancer    Trigeminal neuralgia 09/11/2018   Low back pain without sciatica 02/21/2017   Renal cyst 10/18/2016   Atherosclerosis of aorta (HCC) 10/16/2016   History of colonic polyps 09/25/2016   Dyslipidemia 12/08/2015   Gastroesophageal reflux disease without esophagitis 08/11/2015   Primary osteoarthritis of knee 06/13/2015   Hip bursitis 06/13/2015   RLS (restless legs syndrome) 06/13/2015   Depression, major, in remission (HCC) 06/13/2015   B12 deficiency 06/13/2015   History of Roux-en-Y gastric bypass  06/13/2015   History of iron deficiency anemia 06/13/2015   Vitamin D  deficiency 06/13/2015   Hypertension, benign 06/13/2015   Perennial allergic rhinitis with seasonal variation 06/13/2015   Primary osteoarthritis of both knees 06/13/2015   Cervical radiculitis 01/04/2014   Cervical spinal stenosis 01/04/2014   Cervical osteoarthritis 01/04/2014    Past Surgical History:  Procedure Laterality Date   BARIATRIC SURGERY     CESAREAN SECTION     3 or more   COLONOSCOPY WITH PROPOFOL  N/A 03/15/2019   Procedure: COLONOSCOPY WITH PROPOFOL ;  Surgeon: Unk Corinn Skiff, MD;  Location: [facility] ENDOSCOPY;  Service: Gastroenterology;  Laterality: N/A;   COLONOSCOPY WITH PROPOFOL  N/A 03/15/2022   Procedure: COLONOSCOPY WITH PROPOFOL ;  Surgeon: Unk Corinn Skiff, MD;  Location: [facility] ENDOSCOPY;  Service: Gastroenterology;  Laterality: N/A;   GASTRIC BYPASS     KNEE ARTHROPLASTY Right 10/18/2020   Procedure: COMPUTER ASSISTED TOTAL KNEE ARTHROPLASTY;  Surgeon: Mardee Lynwood SQUIBB, MD;  Location: [facility] ORS;  Service: Orthopedics;  Laterality: Right;   LEFT HEART CATH AND CORONARY ANGIOGRAPHY N/A 01/20/2023   Procedure: LEFT HEART CATH AND CORONARY ANGIOGRAPHY;  Surgeon: Darron Deatrice LABOR, MD;  Location: [facility] INVASIVE CV LAB;  Service: Cardiovascular;  Laterality: N/A;   SHOULDER ARTHROSCOPY Right    TUBAL LIGATION      Family History  Problem Relation Age of Onset   Hypercholesterolemia Mother    Heart disease Mother    Hypertension Mother    Alcohol abuse Father    Lung cancer Brother    Alcohol abuse Brother    Diabetes Mellitus II Sister    Hypertension Maternal Grandmother    Colon cancer Sister    Breast cancer Neg Hx    Prostate cancer Neg Hx    Bladder Cancer Neg Hx    Kidney cancer Neg Hx     Social History   Tobacco Use   Smoking status: Never   Smokeless tobacco: Never   Tobacco comments:    Second hand smoke exposure  Substance Use Topics   Alcohol use: Not Currently     Alcohol/week: 0.0 standard drinks of alcohol     Current Outpatient Medications:    acetaminophen  (TYLENOL ) 650 MG CR tablet, Take 1,300 mg by mouth every 8 (eight) hours as needed for pain., Disp: , Rfl:    albuterol  (VENTOLIN  HFA) 108 (90 Base) MCG/ACT inhaler, INHALE 1-2 PUFFS BY MOUTH EVERY 6 HOURS AS NEEDED FOR WHEEZE OR SHORTNESS OF BREATH, Disp: 18 each, Rfl: 0   Ascorbic Acid  (VITAMIN C ) 1000 MG tablet, Take 1,000 mg by mouth daily., Disp: , Rfl:    aspirin  EC 81 MG tablet, Take 1 tablet by mouth daily., Disp: , Rfl:    atorvastatin  (LIPITOR) 40 MG tablet, Take 1 tablet (40 mg total) by mouth daily., Disp: 90 tablet, Rfl: 1   calcium  carbonate (TUMS - DOSED IN MG ELEMENTAL CALCIUM ) 500 MG chewable tablet, Chew 500-1,000 mg by mouth daily as needed for indigestion  or heartburn., Disp: , Rfl:    Cholecalciferol (D 1000) 25 MCG (1000 UT) capsule, Take 1,000 Units by mouth daily., Disp: , Rfl:    ezetimibe  (ZETIA ) 10 MG tablet, TAKE 1 TABLET BY MOUTH EVERY DAY, Disp: 90 tablet, Rfl: 0   fluticasone  (FLONASE ) 50 MCG/ACT nasal spray, SPRAY 2 SPRAYS INTO EACH NOSTRIL EVERY DAY, Disp: 48 mL, Rfl: 1   Fluticasone -Umeclidin-Vilant (TRELEGY ELLIPTA ) 200-62.5-25 MCG/ACT AEPB, INHALE ONE ACTUATION INTO THE LUNGS DAILY., Disp: 60 each, Rfl: 3   gabapentin  (NEURONTIN ) 100 MG capsule, Take 100-200 mg by mouth 2 (two) times daily. One in am and two at night, Disp: , Rfl:    JARDIANCE 10 MG TABS tablet, Take 10 mg by mouth daily., Disp: , Rfl:    levocetirizine (XYZAL  ALLERGY 24HR) 5 MG tablet, Take 1 tablet (5 mg total) by mouth every evening., Disp: 30 tablet, Rfl: 6   magnesium  oxide (MAG-OX) 400 MG tablet, Take 400 mg by mouth daily., Disp: , Rfl:    Multiple Vitamins-Minerals (CENTRUM SILVER ) tablet, Take 1 tablet by mouth daily., Disp: 30 tablet, Rfl: 0   nebivolol  (BYSTOLIC ) 5 MG tablet, Take 1 tablet (5 mg total) by mouth daily., Disp: 30 tablet, Rfl: 1   nortriptyline  (PAMELOR ) 10 MG capsule,  Take 10-20 mg by mouth at bedtime., Disp: , Rfl:    Probiotic Product (FORTIFY PROBIOTIC WOMENS EX ST PO), Take by mouth daily., Disp: , Rfl:    propranolol  (INDERAL ) 10 MG tablet, Take 1 tablet (10 mg total) by mouth 3 (three) times daily as needed. For sustained fast heart rate, Disp: 60 tablet, Rfl: 3   zinc  gluconate 50 MG tablet, Take 50 mg by mouth daily., Disp: , Rfl:    citalopram  (CELEXA ) 10 MG tablet, Take 1 tablet (10 mg total) by mouth daily. (Patient not taking: Reported on 02/26/2024), Disp: 30 tablet, Rfl: 1  Allergies  Allergen Reactions   Shellfish Allergy     Edema    I personally reviewed active problem list, medication list, allergies with the patient/caregiver today.   ROS  Ten systems reviewed and is negative except as mentioned in HPI    Objective Physical Exam  CONSTITUTIONAL: Patient appears well-developed and well-nourished.  No distress. HEENT: Head atraumatic, normocephalic, neck supple. CARDIOVASCULAR: Normal rate, regular rhythm and normal heart sounds.  No murmur heard. No BLE edema. PULMONARY: Effort normal and breath sounds normal. No respiratory distress. ABDOMINAL: There is no tenderness or distention. PSYCHIATRIC: Patient has a normal mood and affect. behavior is normal. Judgment and thought content normal.  Vitals:   02/26/24 1021  BP: 114/74  Pulse: 75  Resp: 16  Weight: 202 lb 1.6 oz (91.7 kg)  Height: 6' (1.829 m)    Body mass index is 27.41 kg/m.    PHQ2/9:    02/26/2024   10:21 AM 01/19/2024    3:13 PM 11/06/2023   10:24 AM 08/27/2023   10:09 AM 04/28/2023    3:16 PM  Depression screen PHQ 2/9  Decreased Interest 0 0 0 0 0  Down, Depressed, Hopeless 0 0 0 0 0  PHQ - 2 Score 0 0 0 0 0  Altered sleeping 0  0 0 0  Tired, decreased energy 0  0 0 1  Change in appetite 0  0 0 0  Feeling bad or failure about yourself  0  0 0 0  Trouble concentrating 0  0 0 1  Moving slowly or fidgety/restless 0  0 0 0  Suicidal thoughts 0  0 0 0   PHQ-9 Score 0  0 0 2  Difficult doing work/chores Not difficult at all  Not difficult at all Not difficult at all Not difficult at all    phq 9 is negative  Fall Risk:    02/26/2024   10:15 AM 01/19/2024    3:13 PM 11/06/2023   10:26 AM 08/27/2023   10:06 AM 04/14/2023   10:11 AM  Fall Risk   Falls in the past year? 0 0 0 0 0  Number falls in past yr: 0 0 0 0 0  Injury with Fall? 0 0 0 0 0  Risk for fall due to : No Fall Risks No Fall Risks No Fall Risks No Fall Risks No Fall Risks  Follow up Falls evaluation completed Falls prevention discussed;Education provided;Falls evaluation completed Falls prevention discussed;Falls evaluation completed Falls prevention discussed;Education provided;Falls evaluation completed Falls evaluation completed;Education provided;Falls prevention discussed      Assessment & Plan Atherosclerosis of aorta and  unstable angina Atherosclerosis well-managed, no current symptoms. Compliant with atorvastatin  and ezetimibe . - Continue atorvastatin  40 mg daily. - Continue ezetimibe  10 mg daily. - Send refills for atorvastatin  and ezetimibe  to pharmacy. - check lipid panel   Aneurysm of celiac artery Aneurysm monitored by vascular surgeon. Compliant with cholesterol management. - Continue follow-up with vascular surgeon as scheduled. - Continue cholesterol management with atorvastatin  and ezetimibe .  Chronic kidney disease stage 3B with secondary hyperparathyroidism Stage 3B CKD with increased creatinine. Secondary hyperparathyroidism present. Under nephrologist care, taking Jardiance. - Continue Jardiance as prescribed by nephrologist. - Follow up with nephrologist in July. - Order comprehensive metabolic panel  Hypertension Hypertension well-controlled with Bystolic . No side effects reported. - Continue Bystolic  5 mg daily. - Send 90-day supply of Bystolic  to pharmacy. - Check CBC and TSH  Hyperlipidemia Hyperlipidemia managed with atorvastatin  and  ezetimibe . Compliant with regimen. - Continue atorvastatin  40 mg daily. - Continue ezetimibe  10 mg daily. - Send refills for atorvastatin  and ezetimibe  to pharmacy.  Sarcoidosis of lung/Interstitial lung disease due to granulomatous disease Sarcoidosis of lung. Compliant with Trelegy and Xyzal . Occasional triggers noted. - Continue Trelegy inhaler as prescribed. - Continue Xyzal  for allergy management. - Follow up with pulmonologist as scheduled.  Chronic asthmatic bronchitis Managed with Trelegy inhaler. Occasional phlegm, no significant wheezing. - Continue Trelegy inhaler as prescribed. - Avoid known triggers.  Obstructive sleep apnea Managed with nightly CPAP use.  - Continue CPAP use nightly. - Discuss re-evaluation for CPAP necessity with pulmonologist.  Restless legs syndrome with muscle cramps Persistent despite increased gabapentin . Some improvement, cramps in feet and hands remain. - Continue gabapentin  500 mg daily as prescribed by neurologist. - Consider further dosage adjustment if necessary.  Sickle Cell Trait -unchanged   Depression in remission Depression in remission. Managing stress with therapy, not started citalopram . - Continue with therapy and stress management strategies.

## 2024-02-27 ENCOUNTER — Ambulatory Visit: Payer: Self-pay | Admitting: Family Medicine

## 2024-02-27 LAB — COMPREHENSIVE METABOLIC PANEL WITH GFR
AG Ratio: 1.4 (calc) (ref 1.0–2.5)
ALT: 27 U/L (ref 6–29)
AST: 39 U/L — ABNORMAL HIGH (ref 10–35)
Albumin: 4.3 g/dL (ref 3.6–5.1)
Alkaline phosphatase (APISO): 72 U/L (ref 37–153)
BUN/Creatinine Ratio: 14 (calc) (ref 6–22)
BUN: 24 mg/dL (ref 7–25)
CO2: 28 mmol/L (ref 20–32)
Calcium: 10 mg/dL (ref 8.6–10.4)
Chloride: 107 mmol/L (ref 98–110)
Creat: 1.69 mg/dL — ABNORMAL HIGH (ref 0.60–1.00)
Globulin: 3 g/dL (ref 1.9–3.7)
Glucose, Bld: 96 mg/dL (ref 65–99)
Potassium: 5.1 mmol/L (ref 3.5–5.3)
Sodium: 145 mmol/L (ref 135–146)
Total Bilirubin: 0.6 mg/dL (ref 0.2–1.2)
Total Protein: 7.3 g/dL (ref 6.1–8.1)
eGFR: 32 mL/min/1.73m2 — ABNORMAL LOW (ref >=60)

## 2024-02-27 LAB — CBC WITH DIFFERENTIAL/PLATELET
Absolute Lymphocytes: 2303 {cells}/uL (ref 850–3900)
Absolute Monocytes: 564 {cells}/uL (ref 200–950)
Basophils Absolute: 40 {cells}/uL (ref 0–200)
Basophils Relative: 0.7 %
Eosinophils Absolute: 188 {cells}/uL (ref 15–500)
Eosinophils Relative: 3.3 %
HCT: 42.7 % (ref 35.0–45.0)
Hemoglobin: 13.4 g/dL (ref 11.7–15.5)
MCH: 29.3 pg (ref 27.0–33.0)
MCHC: 31.4 g/dL — ABNORMAL LOW (ref 32.0–36.0)
MCV: 93.2 fL (ref 80.0–100.0)
MPV: 10.5 fL (ref 7.5–12.5)
Monocytes Relative: 9.9 %
Neutro Abs: 2605 {cells}/uL (ref 1500–7800)
Neutrophils Relative %: 45.7 %
Platelets: 229 Thousand/uL (ref 140–400)
RBC: 4.58 Million/uL (ref 3.80–5.10)
RDW: 12.7 % (ref 11.0–15.0)
Total Lymphocyte: 40.4 %
WBC: 5.7 Thousand/uL (ref 3.8–10.8)

## 2024-02-27 LAB — VITAMIN B12: Vitamin B-12: 699 pg/mL (ref 200–1100)

## 2024-02-27 LAB — LIPID PANEL
Cholesterol: 167 mg/dL (ref <200)
HDL: 86 mg/dL (ref >=50)
LDL Cholesterol (Calc): 67 mg/dL
Non-HDL Cholesterol (Calc): 81 mg/dL (ref <130)
Total CHOL/HDL Ratio: 1.9 (calc) (ref <5.0)
Triglycerides: 55 mg/dL (ref <150)

## 2024-02-27 LAB — PARATHYROID HORMONE, INTACT (NO CA): PTH: 60 pg/mL (ref 16–77)

## 2024-02-27 LAB — TSH: TSH: 2.3 m[IU]/L (ref 0.40–4.50)

## 2024-02-27 LAB — VITAMIN D 25 HYDROXY (VIT D DEFICIENCY, FRACTURES): Vit D, 25-Hydroxy: 50 ng/mL (ref 30–100)

## 2024-03-06 ENCOUNTER — Other Ambulatory Visit: Payer: Self-pay | Admitting: Internal Medicine

## 2024-03-06 DIAGNOSIS — J4521 Mild intermittent asthma with (acute) exacerbation: Secondary | ICD-10-CM

## 2024-03-06 DIAGNOSIS — D869 Sarcoidosis, unspecified: Secondary | ICD-10-CM

## 2024-03-17 DIAGNOSIS — R319 Hematuria, unspecified: Secondary | ICD-10-CM | POA: Diagnosis not present

## 2024-03-17 DIAGNOSIS — I129 Hypertensive chronic kidney disease with stage 1 through stage 4 chronic kidney disease, or unspecified chronic kidney disease: Secondary | ICD-10-CM | POA: Diagnosis not present

## 2024-03-17 DIAGNOSIS — N1832 Chronic kidney disease, stage 3b: Secondary | ICD-10-CM | POA: Diagnosis not present

## 2024-03-17 DIAGNOSIS — D631 Anemia in chronic kidney disease: Secondary | ICD-10-CM | POA: Diagnosis not present

## 2024-04-04 ENCOUNTER — Other Ambulatory Visit: Payer: Self-pay | Admitting: Family Medicine

## 2024-04-04 DIAGNOSIS — F341 Dysthymic disorder: Secondary | ICD-10-CM

## 2024-04-06 ENCOUNTER — Ambulatory Visit: Admitting: Internal Medicine

## 2024-04-08 ENCOUNTER — Ambulatory Visit (INDEPENDENT_AMBULATORY_CARE_PROVIDER_SITE_OTHER): Admitting: Internal Medicine

## 2024-04-08 ENCOUNTER — Encounter: Payer: Self-pay | Admitting: Internal Medicine

## 2024-04-08 VITALS — BP 110/70 | HR 64 | Temp 98.4°F | Ht 72.0 in | Wt 205.8 lb

## 2024-04-08 DIAGNOSIS — J4530 Mild persistent asthma, uncomplicated: Secondary | ICD-10-CM | POA: Diagnosis not present

## 2024-04-08 DIAGNOSIS — G4733 Obstructive sleep apnea (adult) (pediatric): Secondary | ICD-10-CM | POA: Diagnosis not present

## 2024-04-08 DIAGNOSIS — D869 Sarcoidosis, unspecified: Secondary | ICD-10-CM

## 2024-04-08 NOTE — Progress Notes (Unsigned)
 **CT chest 05/01/2019>> bilateral scattered infiltrates, predominantly upper lobe. **CPAP download 07/22/2018-08/20/2018>> usage greater than 4 hours 26/30 days.  Average usage on days used is 7 hours 9 minutes, pressure range 5-15.  Median pressure 8, 95th percentile pressure 11, maximum pressure 13.  Leaks are within normal limits.  Residual AHI is 4.4 overall this shows good compliance with CPAP with excellent control of obstructive sleep apnea. **HST 06/03/2018>> mild sleep apnea with AHI of 10. **chest x-ray7/6/18>>  hyperinflation consistent with emphysema. **CBC 02/21/17>> eosinophils equals 300. CPAP download August 2023 100% for days 90% for greater than 4 hours AHI reduced to 1.2  Chief complaint Follow-up assessment for reactive airways disease Follow-up assessment for OSA Follow-up sarcoid Follow-up assessment for abnormal CT chest  HPI Biopsy-proven sarcoid in the year 2000 Abnormal CT chest since 2020 Most recent CAT scan August 2023 shows similar findings over the last several years Bilateral interstitial lung disease nodular opacities with cavitary lesions with mediastinal adenopathy with calcifications No significant changes over the last several years Sarcoid stable  Previous diagnosis of COVID-19 infection several times Patient was placed on Trelegy inhaler for reactive airways disease Patient attempted to wean off Trelegy longer need to restart Trelegy Plan to use albuterol  as needed  Regarding OSA  Discussed sleep data and reviewed with patient.  Encouraged proper weight management.  Discussed driving precautions and its relationship with hypersomnolence.  Discussed sleep hygiene, and benefits of a fixed sleep waked time.  The importance of getting eight or more hours of sleep discussed with patient.  Discussed limiting the use of the computer and television before bedtime.  Decrease naps during the day, so night time sleep will become enhanced.  Limit  caffeine, and sleep deprivation.   Patient uses and benefits from therapy Using CPAP nightly and with naps Pressure setting is comfortable and is sleeping well.    Pets: Dog Occupation: Retired Airline pilot Exposures: No known exposures.  No mold, hot tub, Jacuzzi.  No feather pillows or comforters Smoking history: Never smoker Travel history: From [place] .  New to [place]  in 2005 Relevant family history: Brother had lung cancer.  He was a smoker.      HRCT Chest 10/03/2021>> Ordered at [facility] by Pulmonology Specialist FINDINGS:  AIRWAYS, LUNGS, PLEURA: Clear central tracheobronchial tree.  No pleural effusion. Multifocal scattered peribronchial nodular consolidation bilaterally. Left apical alveolar airspace consolidation with architectural distortion. Minimal peripheral predominant reticulation and traction bronchiectasis in middle lobe, lingula and bilateral lower lobes.Mild air trapping at the lung bases. No honeycombing   MEDIASTINUM: Normal heart size.  No pericardial effusion.  Mildly dilated 3.2 cm main pulmonary artery. Numerous enlarged and calcified mediastinal and bilateral hilar lymph nodes.   IMAGED ABDOMEN: Partially imaged lobulated multicystic kidneys containing nonobstructing nephrolithiasis. Sequela of Roux-en-Y gastric bypass. Colonic diverticulosis.      CT chest August 2023 similar findings from February 2023  CXR 09/10/2021 Bilateral ill-defined nodular densities predominantly in the right upper lobe, likely chronic inflammatory process. No new consolidation. There is no pleural effusion pneumothorax. The cardiac silhouette is within normal limits. No acute osseous pathology. Degenerative changes of the spine.        Latest Ref Rng & Units 02/26/2024   12:19 PM 05/26/2023   10:36 AM 01/18/2023    5:31 AM  CBC  WBC 3.8 - 10.8 Thousand/uL 5.7  5.6  3.9   Hemoglobin 11.7 - 15.5 g/dL 86.5  87.5  88.3   Hematocrit 35.0 -  45.0 % 42.7  38.9  35.4    Platelets 140 - 400 Thousand/uL 229  216  185        Latest Ref Rng & Units 02/26/2024   12:19 PM 01/18/2023    5:31 AM 01/17/2023   11:32 AM  BMP  Glucose 65 - 99 mg/dL 96  90  831   BUN 7 - 25 mg/dL 24  17  21    Creatinine 0.60 - 1.00 mg/dL 8.30  8.56  8.35   BUN/Creat Ratio 6 - 22 (calc) 14     Sodium 135 - 146 mmol/L 145  138  138   Potassium 3.5 - 5.3 mmol/L 5.1  4.4  4.3   Chloride 98 - 110 mmol/L 107  106  103   CO2 20 - 32 mmol/L 28  25  21    Calcium  8.6 - 10.4 mg/dL 89.9  9.1  9.2     BNP No results found for: BNP  ProBNP No results found for: PROBNP  PFT    Component Value Date/Time   FEV1PRE 3.54 04/07/2020 1148   FEV1POST 3.55 04/07/2020 1148   FVCPRE 4.71 04/07/2020 1148   FVCPOST 4.61 04/07/2020 1148   TLC 7.48 04/07/2020 1148   DLCOUNC 20.30 04/07/2020 1148   PREFEV1FVCRT 75 04/07/2020 1148   PSTFEV1FVCRT 77 04/07/2020 1148    No results found.    BP 110/70   Pulse 64   Temp 98.4 F (36.9 C)   Ht 6' (1.829 m)   Wt 205 lb 12.8 oz (93.4 kg)   SpO2 97%   BMI 27.91 kg/m      Physical Examination:   General Appearance: No distress  EYES PERRLA, EOM intact.   NECK Supple, No JVD Pulmonary: normal breath sounds, No wheezing.  CardiovascularNormal S1,S2.  No m/r/g.   Abdomen: Benign, Soft, non-tender. Neurology UE/LE 5/5 strength, no focal deficits Ext pulses intact, cap refill intact ALL OTHER ROS ARE NEGATIVE      Assessment/Plan 73 year old pleasant African-American female with a diagnosis of pulmonary sarcoid back in 2000 biopsy-proven with a history and diagnosis of obstructive sleep apnea with previous history of COVID-19 infections with a history of intermittent reactive airways disease and asthma    Assessment & Plan OSA (obstructive sleep apnea)  Assessment of OSA Previous AHI  Continue CPAP as prescribed  Excellent compliance report Reviewed compliance report in detail with patient Patient definitely benefits the  use of CPAP therapy as prescribed Using CPAP nightly and with naps Pressure setting is comfortable and is sleeping well. CPAP prescription  AHI reduced to  No evidence of acute heart failure at this time No respiratory distress No fevers, chills, nausea, vomiting, diarrhea No evidence hemoptysis  Patient Instructions Continue to use CPAP every night, minimum of 4-6 hours a night.  Change equipment every 30 days or as directed by DME.  Wash your tubing with warm soap and water daily, hang to dry. Wash humidifier portion weekly. Use bottled, distilled water and change daily   Be aware of reduced alertness and do not drive or operate heavy machinery if experiencing this or drowsiness.  Exercise encouraged, as tolerated. Encouraged proper weight management.  Important to get eight or more hours of sleep  Limiting the use of the computer and television before bedtime.  Decrease naps during the day, so night time sleep will become enhanced.  Limit caffeine, and sleep deprivation.  HTN, stroke, uncontrolled diabetes and heart failure are potential risk factors.  Risk of untreated  sleep apnea including cardiac arrhthymias, stroke, DM, pulm HTN.   Sarcoidosis  Pulmonary sarcoid Stable at this time no exacerbations No indication for repeat CT chest at this time Previous CT scans of her chest reviewed in detail No indication for medication at this time Continue inhalers as prescribed Rinse mouth after use Mild persistent asthma without complication  Reactive airways disease and asthma At this time patient has no symptoms Infrequent use of albuterol  Continue Trelegy as prescribed Rinse mouth after use No exacerbation at this time No evidence of heart failure at this time No evidence or signs of infection at this time No respiratory distress No fevers, chills, nausea, vomiting, diarrhea No evidence of lower extremity edema No evidence hemoptysis Avoid Allergens and  Irritants Avoid secondhand smoke Avoid SICK contacts Recommend  Masking  when appropriate Recommend Keep up-to-date with vaccinations     Follow-up with cardiology as scheduled  Follow-up eye examination annually  Follow-up nephrology as scheduled   MEDICATION ADJUSTMENTS/LABS AND TESTS ORDERED: Continue CPAP as prescribed Continue inhalers as needed Can use albuterol  inhaler prior to exertion Follow-up cardiology as scheduled Follow-up eye examination  Follow-up kidney doctor as scheduled   CURRENT MEDICATIONS REVIEWED AT LENGTH WITH PATIENT TODAY   Patient  satisfied with Plan of action and management. All questions answered   Follow up 6 months   I spent a total of 41 minutes dedicated to the care of this patient on the date of this encounter to include pre-visit review of records, face-to-face time with the patient discussing conditions above, post visit ordering of testing, clinical documentation with the electronic health record, making appropriate referrals as documented, and communicating necessary information to the patient's healthcare team.    The Patient requires high complexity decision making for assessment and support, frequent evaluation and titration of therapies, application of advanced monitoring technologies and extensive interpretation of multiple databases.  Patient satisfied with Plan of action and management. All questions answered    Nickolas Alm Cellar, M.D.  [facility]  Medical Director [facility] Medical Director [facility] Cardio-Pulmonary Department

## 2024-04-08 NOTE — Patient Instructions (Signed)
 Continue Trelegy as prescribed Rinse mouth after use Recommending repeat home sleep test to assess for sleep apnea  Excellent Job A+ GOLD STAR!!  Continue CPAP as prescribed  Patient Instructions Continue to use CPAP every night, minimum of 4-6 hours a night.  Change equipment every 30 days or as directed by DME.  Wash your tubing with warm soap and water daily, hang to dry. Wash humidifier portion weekly. Use bottled, distilled water and change daily   Be aware of reduced alertness and do not drive or operate heavy machinery if experiencing this or drowsiness.  Exercise encouraged, as tolerated. Encouraged proper weight management.  Important to get eight or more hours of sleep  Limiting the use of the computer and television before bedtime.  Decrease naps during the day, so night time sleep will become enhanced.  Limit caffeine, and sleep deprivation.    Avoid Allergens and Irritants Avoid secondhand smoke Avoid SICK contacts Recommend  Masking  when appropriate Recommend Keep up-to-date with vaccinations

## 2024-04-21 ENCOUNTER — Ambulatory Visit: Payer: Self-pay

## 2024-04-21 NOTE — Telephone Encounter (Signed)
 FYI Only or Action Required?: Action required by provider: request for appointment.  Patient was last seen in primary care on 02/26/2024 by Melissa Mire, MD.  Called Nurse Triage reporting Abdominal Pain.  Symptoms began several weeks ago.  Interventions attempted: Nothing.  Symptoms are: unchanged.  Triage Disposition: See Physician Within 24 Hours  Patient/caregiver understands and will follow disposition?: YesCopied from CRM #[id number]. Topic: Clinical - Red Word Triage >> Apr 21, 2024  2:06 PM Melissa [name] wrote: [facility] that prompted transfer to Nurse Triage: Patient called in has severe abdominal pain Reason for Disposition  [1] MODERATE pain (e.g., interferes with normal activities) AND [2] pain comes and goes (cramps) AND [3] present > 24 hours  (Exception: Pain with Vomiting or Diarrhea - see that Guideline.)  Answer Assessment - Initial Assessment Questions Pt stated she has mentioned this to PCP before but it was never really addressed about would like to take a closer look at what's going on. Pt tried to get in gastro and was told seek care at PCP.     1. LOCATION: Where does it hurt?      Lower abd-right side 2. RADIATION: Does the pain shoot anywhere else? (e.g., chest, back)     denies 3. ONSET: When did the pain begin? (e.g., minutes, hours or days ago)      10-14 days 4. SUDDEN: Gradual or sudden onset?     Gradual  5. PATTERN Does the pain come and go, or is it constant?     Comes and goes 6. SEVERITY: How bad is the pain?  (e.g., Scale 1-10; mild, moderate, or severe)     mild 7. RECURRENT SYMPTOM: Have you ever had this type of stomach pain before? If Yes, ask: When was the last time? and What happened that time?      yes 8. CAUSE: What do you think is causing the stomach pain? (e.g., gallstones, recent abdominal surgery)     Not sure 9. RELIEVING/AGGRAVATING FACTORS: What makes it better or worse? (e.g., antacids, bending or twisting  motion, bowel movement)     Not sure 10. OTHER SYMPTOMS: Do you have any other symptoms? (e.g., back pain, diarrhea, fever, urination pain, vomiting)       denies  Protocols used: Abdominal Pain - Female-A-AH

## 2024-04-22 ENCOUNTER — Ambulatory Visit
Admission: RE | Admit: 2024-04-22 | Discharge: 2024-04-22 | Disposition: A | Discharge status: Home / Self Care | Source: Ambulatory Visit | Attending: Family Medicine | Admitting: Family Medicine

## 2024-04-22 ENCOUNTER — Telehealth: Payer: Self-pay

## 2024-04-22 ENCOUNTER — Ambulatory Visit (INDEPENDENT_AMBULATORY_CARE_PROVIDER_SITE_OTHER): Admitting: Family Medicine

## 2024-04-22 ENCOUNTER — Encounter: Payer: Self-pay | Admitting: Family Medicine

## 2024-04-22 VITALS — BP 110/78 | HR 68 | Resp 16 | Ht 72.0 in | Wt 201.6 lb

## 2024-04-22 DIAGNOSIS — R198 Other specified symptoms and signs involving the digestive system and abdomen: Secondary | ICD-10-CM

## 2024-04-22 DIAGNOSIS — Z87442 Personal history of urinary calculi: Secondary | ICD-10-CM | POA: Diagnosis not present

## 2024-04-22 DIAGNOSIS — R1031 Right lower quadrant pain: Secondary | ICD-10-CM | POA: Diagnosis not present

## 2024-04-22 DIAGNOSIS — R350 Frequency of micturition: Secondary | ICD-10-CM

## 2024-04-22 LAB — POCT URINALYSIS DIPSTICK
Bilirubin, UA: NEGATIVE
Blood, UA: NEGATIVE
Glucose, UA: NEGATIVE
Ketones, UA: NEGATIVE
Leukocytes, UA: NEGATIVE
Nitrite, UA: NEGATIVE
Odor: NORMAL
Protein, UA: NEGATIVE
Spec Grav, UA: 1.01 (ref 1.010–1.025)
Urobilinogen, UA: 0.2 U/dL
pH, UA: 5 (ref 5.0–8.0)

## 2024-04-22 NOTE — Progress Notes (Signed)
 Name: Melissa [name]   MRN: [id number]    DOB: May 22, 1951   Date:04/22/2024       Progress Note  Subjective  Chief Complaint  Chief Complaint  Patient presents with   Abdominal Pain    Lower R side, comes and goes x2 weeks    Discussed the use of AI scribe software for clinical note transcription with the patient, who gave verbal consent to proceed.  History of Present Illness Melissa [name] is a 73 year old female who presents with acute right lower quadrant abdominal pain.  She has been experiencing intermittent right lower quadrant abdominal pain for approximately two weeks. The pain is dull, with episodes of increased severity, but she continues to perform daily activities, including exercising at the [facility].  She has a history of constipation but has been taking probiotics for over a year, which have improved her bowel movements. Recently, she noticed a slight change in her bowel habits, with less frequent bowel movements. No blood or mucus in stools , she states also gassy   There is a family history of colon cancer in her sister. Her last colonoscopy was two years ago, and she is due for another one next year.  She reports an increase in urinary frequency, which she attributes to her regular water intake and recent hot weather. No blood in urine.  She has a history of kidney stones but states that the current pain does not resemble the pain experienced when passing a kidney stone.  No changes in appetite and she continues to have a good appetite.    Patient Active Problem List   Diagnosis Date Noted   Interstitial lung disease due to granulomatous disease (HCC) 03/25/2023   Cardiomyopathy, unspecified type (HCC) 03/25/2023   HTN (hypertension) 01/17/2023   HLD (hyperlipidemia) 01/17/2023   Unstable angina (HCC) 01/17/2023   Polyp of transverse colon    Total knee replacement status 10/18/2020   Primary osteoarthritis of right hip 08/06/2020   Sarcoidosis of lung (HCC)  05/29/2020   Aneurysm of celiac artery (HCC) 02/11/2020   Localized swelling of right lower leg 09/06/2019   OSA on CPAP 09/06/2019   Secondary hyperparathyroidism of renal origin (HCC) 07/21/2019   Anemia in chronic kidney disease 06/14/2019   Benign hypertensive kidney disease with chronic kidney disease 06/14/2019   Sickle cell trait (HCC) 06/14/2019   Stage 3b chronic kidney disease (HCC) 06/14/2019   Chronic bronchitis (HCC) 05/19/2019   Chronic venous insufficiency 04/15/2019   Lymphedema 04/15/2019   Family history of colon cancer    Trigeminal neuralgia 09/11/2018   Low back pain without sciatica 02/21/2017   Renal cyst 10/18/2016   Atherosclerosis of aorta (HCC) 10/16/2016   History of colonic polyps 09/25/2016   Dyslipidemia 12/08/2015   Gastroesophageal reflux disease without esophagitis 08/11/2015   Primary osteoarthritis of knee 06/13/2015   Hip bursitis 06/13/2015   RLS (restless legs syndrome) 06/13/2015   Depression, major, in remission (HCC) 06/13/2015   B12 deficiency 06/13/2015   History of Roux-en-Y gastric bypass 06/13/2015   History of iron deficiency anemia 06/13/2015   Vitamin D  deficiency 06/13/2015   Hypertension, benign 06/13/2015   Perennial allergic rhinitis with seasonal variation 06/13/2015   Primary osteoarthritis of both knees 06/13/2015   Cervical radiculitis 01/04/2014   Cervical spinal stenosis 01/04/2014   Cervical osteoarthritis 01/04/2014    Social History   Tobacco Use   Smoking status: Never   Smokeless tobacco: Never   Tobacco comments:  Second hand smoke exposure  Substance Use Topics   Alcohol use: Not Currently    Alcohol/week: 0.0 standard drinks of alcohol     Current Outpatient Medications:    acetaminophen  (TYLENOL ) 650 MG CR tablet, Take 1,300 mg by mouth every 8 (eight) hours as needed for pain., Disp: , Rfl:    albuterol  (VENTOLIN  HFA) 108 (90 Base) MCG/ACT inhaler, INHALE 1-2 PUFFS BY MOUTH EVERY 6 HOURS AS  NEEDED FOR WHEEZE OR SHORTNESS OF BREATH, Disp: 18 each, Rfl: 0   amoxicillin  (AMOXIL ) 500 MG capsule, Take 1,000 mg by mouth 2 (two) times daily., Disp: , Rfl:    Ascorbic Acid  (VITAMIN C ) 1000 MG tablet, Take 1,000 mg by mouth daily., Disp: , Rfl:    aspirin  EC 81 MG tablet, Take 1 tablet by mouth daily., Disp: , Rfl:    atorvastatin  (LIPITOR) 40 MG tablet, Take 1 tablet (40 mg total) by mouth daily., Disp: 90 tablet, Rfl: 1   calcium  carbonate (TUMS - DOSED IN MG ELEMENTAL CALCIUM ) 500 MG chewable tablet, Chew 500-1,000 mg by mouth daily as needed for indigestion or heartburn., Disp: , Rfl:    Cholecalciferol (D 1000) 25 MCG (1000 UT) capsule, Take 1,000 Units by mouth daily., Disp: , Rfl:    citalopram  (CELEXA ) 10 MG tablet, Take 10 mg by mouth daily., Disp: , Rfl:    ezetimibe  (ZETIA ) 10 MG tablet, Take 1 tablet (10 mg total) by mouth daily., Disp: 90 tablet, Rfl: 1   fluticasone  (FLONASE ) 50 MCG/ACT nasal spray, SPRAY 2 SPRAYS INTO EACH NOSTRIL EVERY DAY, Disp: 48 mL, Rfl: 1   gabapentin  (NEURONTIN ) 100 MG capsule, Take 100-200 mg by mouth 2 (two) times daily. One in am, one in pm and  and three at night, Disp: , Rfl:    JARDIANCE 10 MG TABS tablet, Take 10 mg by mouth daily., Disp: , Rfl:    levocetirizine (XYZAL  ALLERGY 24HR) 5 MG tablet, Take 1 tablet (5 mg total) by mouth every evening., Disp: 30 tablet, Rfl: 6   magnesium  oxide (MAG-OX) 400 MG tablet, Take 400 mg by mouth daily., Disp: , Rfl:    Multiple Vitamins-Minerals (CENTRUM SILVER ) tablet, Take 1 tablet by mouth daily., Disp: 30 tablet, Rfl: 0   nebivolol  (BYSTOLIC ) 5 MG tablet, Take 1 tablet (5 mg total) by mouth daily., Disp: 90 tablet, Rfl: 1   Probiotic Product (FORTIFY PROBIOTIC WOMENS EX ST PO), Take by mouth daily., Disp: , Rfl:    propranolol  (INDERAL ) 10 MG tablet, Take 1 tablet (10 mg total) by mouth 3 (three) times daily as needed. For sustained fast heart rate, Disp: 60 tablet, Rfl: 3   SHINGRIX  injection, Inject 0.5  mLs into the muscle once., Disp: , Rfl:    TRELEGY ELLIPTA  200-62.5-25 MCG/ACT AEPB, INHALE ONE ACTUATION INTO THE LUNGS DAILY., Disp: 60 each, Rfl: 3   zinc  gluconate 50 MG tablet, Take 50 mg by mouth daily., Disp: , Rfl:   Allergies  Allergen Reactions   Shellfish Allergy     Edema    ROS  Ten systems reviewed and is negative except as mentioned in HPI    Objective  Vitals:   04/22/24 1017  BP: 110/78  Pulse: 68  Resp: 16  SpO2: 99%  Weight: 201 lb 9.6 oz (91.4 kg)  Height: 6' (1.829 m)    Body mass index is 27.34 kg/m.    Physical Exam CONSTITUTIONAL: Patient appears well-developed and well-nourished. No distress. HEENT: Head atraumatic, normocephalic, neck supple. CARDIOVASCULAR:  Normal rate, regular rhythm and normal heart sounds. No murmur heard. No BLE edema. PULMONARY: Effort normal and breath sounds normal. No respiratory distress. ABDOMINAL: Abdomen non-tender to palpation. Negative CVA tenderness. No distention. Positive for increase in bowel sounds  PSYCHIATRIC: Patient has a normal mood and affect. Behavior is normal. Judgment and thought content normal.  Recent Results (from the past 2160 hours)  Lipid panel     Status: None   Collection Time: 02/26/24 12:19 PM  Result Value Ref Range   Cholesterol 167 <200 mg/dL   HDL 86 > OR = 50 mg/dL   Triglycerides 55 <849 mg/dL   LDL Cholesterol (Calc) 67 mg/dL (calc)    Comment: Reference range: <100 . Desirable range <100 mg/dL for primary prevention;   <70 mg/dL for patients with CHD or diabetic patients  with > or = 2 CHD risk factors. SABRA LDL-C is now calculated using the Martin-Hopkins  calculation, which is a validated novel method providing  better accuracy than the Friedewald equation in the  estimation of LDL-C.  Gladis APPLETHWAITE et al. SANDREA. 7986;689(80): 2061-2068  (http://education.[facility].com/faq/FAQ164)    Total CHOL/HDL Ratio 1.9 <5.0 (calc)   Non-HDL Cholesterol (Calc) 81 <869 mg/dL  (calc)    Comment: For patients with diabetes plus 1 major ASCVD risk  factor, treating to a non-HDL-C goal of <100 mg/dL  (LDL-C of <29 mg/dL) is considered a therapeutic  option.   CBC with Differential/Platelet     Status: Abnormal   Collection Time: 02/26/24 12:19 PM  Result Value Ref Range   WBC 5.7 3.8 - 10.8 Thousand/uL   RBC 4.58 3.80 - 5.10 Million/uL   Hemoglobin 13.4 11.7 - 15.5 g/dL   HCT 57.2 64.9 - 54.9 %   MCV 93.2 80.0 - 100.0 fL   MCH 29.3 27.0 - 33.0 pg   MCHC 31.4 (L) 32.0 - 36.0 g/dL    Comment: For adults, a slight decrease in the calculated MCHC value (in the range of 30 to 32 g/dL) is most likely not clinically significant; however, it should be interpreted with caution in correlation with other red cell parameters and the patient's clinical condition.    RDW 12.7 11.0 - 15.0 %   Platelets 229 140 - 400 Thousand/uL   MPV 10.5 7.5 - 12.5 fL   Neutro Abs 2,605 1,500 - 7,800 cells/uL   Absolute Lymphocytes 2,303 850 - 3,900 cells/uL   Absolute Monocytes 564 200 - 950 cells/uL   Eosinophils Absolute 188 15 - 500 cells/uL   Basophils Absolute 40 0 - 200 cells/uL   Neutrophils Relative % 45.7 %   Total Lymphocyte 40.4 %   Monocytes Relative 9.9 %   Eosinophils Relative 3.3 %   Basophils Relative 0.7 %  Comprehensive metabolic panel with GFR     Status: Abnormal   Collection Time: 02/26/24 12:19 PM  Result Value Ref Range   Glucose, Bld 96 65 - 99 mg/dL    Comment: .            Fasting reference interval .    BUN 24 7 - 25 mg/dL   Creat 8.30 (H) 9.39 - 1.00 mg/dL   eGFR 32 (L) > OR = 60 mL/min/1.47m2   BUN/Creatinine Ratio 14 6 - 22 (calc)   Sodium 145 135 - 146 mmol/L   Potassium 5.1 3.5 - 5.3 mmol/L   Chloride 107 98 - 110 mmol/L   CO2 28 20 - 32 mmol/L   Calcium  10.0 8.6 - 10.4  mg/dL   Total Protein 7.3 6.1 - 8.1 g/dL   Albumin  4.3 3.6 - 5.1 g/dL   Globulin 3.0 1.9 - 3.7 g/dL (calc)   AG Ratio 1.4 1.0 - 2.5 (calc)   Total Bilirubin 0.6 0.2 -  1.2 mg/dL   Alkaline phosphatase (APISO) 72 37 - 153 U/L   AST 39 (H) 10 - 35 U/L   ALT 27 6 - 29 U/L  TSH     Status: None   Collection Time: 02/26/24 12:19 PM  Result Value Ref Range   TSH 2.30 0.40 - 4.50 mIU/L  Vitamin B12     Status: None   Collection Time: 02/26/24 12:19 PM  Result Value Ref Range   Vitamin B-12 699 200 - 1,100 pg/mL  VITAMIN D  25 Hydroxy (Vit-D Deficiency, Fractures)     Status: None   Collection Time: 02/26/24 12:19 PM  Result Value Ref Range   Vit D, 25-Hydroxy 50 30 - 100 ng/mL    Comment: Vitamin D  Status         25-OH Vitamin D : . Deficiency:                    <20 ng/mL Insufficiency:             20 - 29 ng/mL Optimal:                 > or = 30 ng/mL . For 25-OH Vitamin D  testing on patients on  D2-supplementation and patients for whom quantitation  of D2 and D3 fractions is required, the [facility]ure[facility](TM) 25-OH VIT D, (D2,D3), LC/MS/MS is recommended: order  code 07111 (patients >75yrs). . See Note 1 . Note 1 . For additional information, please refer to  http://education.[facility].com/faq/FAQ199  (This link is being provided for informational/ educational purposes only.)   Parathyroid  hormone, intact (no Ca)     Status: None   Collection Time: 02/26/24 12:19 PM  Result Value Ref Range   PTH 60 16 - 77 pg/mL    Comment: . Interpretive Guide    Intact PTH           Calcium  ------------------    ----------           ------- Normal Parathyroid     Normal               Normal Hypoparathyroidism    Low or Low Normal    Low Hyperparathyroidism    Primary            Normal or High       High    Secondary          High                 Normal or Low    Tertiary           High                 High Non-Parathyroid     Hypercalcemia      Low or Low Normal    High .      Assessment & Plan Right lower quadrant abdominal pain Intermittent dull pain for two weeks, worsening. Differential includes appendicitis, kidney stones, ovarian issues,  or muscular pain. Unlikely appendicitis due to maintained appetite. Discussed encapsulated appendicitis, kidney stones, or muscular issues. - Order CT scan of the abdomen. - Perform urinalysis. - Refer to gastroenterologist.  Increased urinary frequency No hematuria. - Perform urinalysis.  Constipation Chronic constipation  with recent change in bowel habits. Probiotics effective. Family history of colon cancer necessitates further investigation. - Continue probiotics. - we will place referral to [facility] clinic if symptoms persists since Dr. Jinny not currently seeing patients in his office

## 2024-04-22 NOTE — Telephone Encounter (Signed)
 Called patient to let her know per PCP CT Results: It was negative for acute problems, PCP or CMA will call her tomorrow to explain the rest of CT,  likely muscular skeletal. Pt verbalized understanding

## 2024-04-23 ENCOUNTER — Ambulatory Visit: Payer: Self-pay | Admitting: Family Medicine

## 2024-04-23 ENCOUNTER — Other Ambulatory Visit: Payer: Self-pay | Admitting: Family Medicine

## 2024-04-23 DIAGNOSIS — R918 Other nonspecific abnormal finding of lung field: Secondary | ICD-10-CM

## 2024-04-28 ENCOUNTER — Encounter

## 2024-04-28 DIAGNOSIS — G4730 Sleep apnea, unspecified: Secondary | ICD-10-CM | POA: Diagnosis not present

## 2024-04-28 DIAGNOSIS — G4733 Obstructive sleep apnea (adult) (pediatric): Secondary | ICD-10-CM

## 2024-04-29 DIAGNOSIS — R0683 Snoring: Secondary | ICD-10-CM | POA: Diagnosis not present

## 2024-04-29 DIAGNOSIS — R0681 Apnea, not elsewhere classified: Secondary | ICD-10-CM | POA: Diagnosis not present

## 2024-05-05 DIAGNOSIS — F411 Generalized anxiety disorder: Secondary | ICD-10-CM | POA: Diagnosis not present

## 2024-05-12 DIAGNOSIS — G4733 Obstructive sleep apnea (adult) (pediatric): Secondary | ICD-10-CM | POA: Diagnosis not present

## 2024-05-13 ENCOUNTER — Ambulatory Visit: Payer: Self-pay

## 2024-05-13 DIAGNOSIS — G4733 Obstructive sleep apnea (adult) (pediatric): Secondary | ICD-10-CM

## 2024-05-19 DIAGNOSIS — F411 Generalized anxiety disorder: Secondary | ICD-10-CM | POA: Diagnosis not present

## 2024-06-16 DIAGNOSIS — F411 Generalized anxiety disorder: Secondary | ICD-10-CM | POA: Diagnosis not present

## 2024-06-23 DIAGNOSIS — Z860101 Personal history of adenomatous and serrated colon polyps: Secondary | ICD-10-CM | POA: Diagnosis not present

## 2024-06-23 DIAGNOSIS — Z9884 Bariatric surgery status: Secondary | ICD-10-CM | POA: Diagnosis not present

## 2024-06-23 DIAGNOSIS — K581 Irritable bowel syndrome with constipation: Secondary | ICD-10-CM | POA: Diagnosis not present

## 2024-06-30 DIAGNOSIS — F411 Generalized anxiety disorder: Secondary | ICD-10-CM | POA: Diagnosis not present

## 2024-07-06 ENCOUNTER — Other Ambulatory Visit: Payer: Self-pay | Admitting: Internal Medicine

## 2024-07-06 DIAGNOSIS — J4521 Mild intermittent asthma with (acute) exacerbation: Secondary | ICD-10-CM

## 2024-07-06 DIAGNOSIS — D869 Sarcoidosis, unspecified: Secondary | ICD-10-CM

## 2024-07-12 ENCOUNTER — Other Ambulatory Visit: Payer: Self-pay | Admitting: Internal Medicine

## 2024-07-12 DIAGNOSIS — J4530 Mild persistent asthma, uncomplicated: Secondary | ICD-10-CM

## 2024-07-12 DIAGNOSIS — H2513 Age-related nuclear cataract, bilateral: Secondary | ICD-10-CM | POA: Diagnosis not present

## 2024-07-12 DIAGNOSIS — H43813 Vitreous degeneration, bilateral: Secondary | ICD-10-CM | POA: Diagnosis not present

## 2024-07-12 DIAGNOSIS — H538 Other visual disturbances: Secondary | ICD-10-CM | POA: Diagnosis not present

## 2024-07-12 DIAGNOSIS — D869 Sarcoidosis, unspecified: Secondary | ICD-10-CM

## 2024-07-12 DIAGNOSIS — H33312 Horseshoe tear of retina without detachment, left eye: Secondary | ICD-10-CM | POA: Diagnosis not present

## 2024-07-12 NOTE — Telephone Encounter (Signed)
 Copied from CRM #[id number]. Topic: Clinical - Medication Refill >> Jul 12, 2024  9:11 AM Devaughn RAMAN wrote: Medication: fluticasone  (FLONASE ) 50 MCG/ACT nasal spray  Has the patient contacted their pharmacy? Yes (Agent: If no, request that the patient contact the pharmacy for the refill. If patient does not wish to contact the pharmacy document the reason why and proceed with request.) (Agent: If yes, when and what did the pharmacy advise?)  This is the patient's preferred pharmacy:  [id number] [address] [facility] - [address] [place] [address] Phone: [phone] Fax: [phone]   Is this the correct pharmacy for this prescription? Yes If no, delete pharmacy and type the correct one.   Has the prescription been filled recently? No  Is the patient out of the medication? Yes  Has the patient been seen for an appointment in the last year OR does the patient have an upcoming appointment? Yes  Can we respond through MyChart? Yes  Agent: Please be advised that Rx refills may take up to 3 business days. We ask that you follow-up with your pharmacy.

## 2024-07-21 ENCOUNTER — Encounter: Payer: Self-pay | Admitting: Medical

## 2024-07-21 ENCOUNTER — Ambulatory Visit: Attending: Medical | Admitting: Medical

## 2024-07-21 VITALS — BP 126/64 | HR 58 | Ht 72.0 in | Wt 205.6 lb

## 2024-07-21 DIAGNOSIS — N1831 Chronic kidney disease, stage 3a: Secondary | ICD-10-CM | POA: Diagnosis not present

## 2024-07-21 DIAGNOSIS — Z8679 Personal history of other diseases of the circulatory system: Secondary | ICD-10-CM | POA: Diagnosis not present

## 2024-07-21 DIAGNOSIS — E782 Mixed hyperlipidemia: Secondary | ICD-10-CM | POA: Diagnosis not present

## 2024-07-21 DIAGNOSIS — I10 Essential (primary) hypertension: Secondary | ICD-10-CM | POA: Diagnosis not present

## 2024-07-21 DIAGNOSIS — G4733 Obstructive sleep apnea (adult) (pediatric): Secondary | ICD-10-CM | POA: Insufficient documentation

## 2024-07-21 DIAGNOSIS — I214 Non-ST elevation (NSTEMI) myocardial infarction: Secondary | ICD-10-CM | POA: Diagnosis not present

## 2024-07-21 NOTE — Patient Instructions (Signed)
 Medication Instructions:  Your physician recommends the following medication changes.  STOP TAKING: Bystolic   *If you need a refill on your cardiac medications before your next appointment, please call your pharmacy*  Lab Work: None ordered at this time   Follow-Up: At [facility], you and your health needs are our priority.  As part of our continuing mission to provide you with exceptional heart care, our providers are all part of one team.  This team includes your primary Cardiologist (physician) and Advanced Practice Providers or APPs (Physician Assistants and Nurse Practitioners) who all work together to provide you with the care you need, when you need it.  Your next appointment:   1 year(s)  Provider:   You may see Cadence Franchester, [name]

## 2024-07-21 NOTE — Progress Notes (Signed)
 Cardiology Office Note   Date:  07/21/2024  ID:  DANICA CAMARENA, DOB Apr 26, 1951, MRN [id number] PCP: Glenard Mire, MD  [facility] Providers Cardiologist:  None   History of Present Illness Melissa [name] is a 73 y.o. female with a hx of aortic atherosclerosis, hyperlipidemia, depression, obesity status post gastric bypass, sarcoidosis of the lung and musculoskeletal system, CKD stage III, OSA on CPAP, RLS, sickle cell trait, cervical radiculopathy, chronic back pain who is being seen for 1 year follow-up.   Myoview in March 2017 showed no ischemia, EF of 57%, overall low risk scan.  Echo in April 2017 showed EF of 35 to 40%, severe hypokinesis of the mid and apical anteroseptal and anterior myocardium, grade 1 diastolic dysfunction, mild TR.  Follow-up limited echo in April 2017 showed EF of 40 to 45%, mild concentric LVH, normal left atrium.  CT renal stone protocol from March 2018 showed aortic atherosclerosis.   In March 2020 patient was started on hydrochlorothiazide  for ankle edema with improvement.  This was later discontinued due to decline in renal function.  Echo in July 2020 showed EF of 50 to 55%, no wall motion abnormality.   Patient was admitted the end of May 2024 with chest pain found to have mildly elevated troponin.  Cardiac cath showed normal coronaries.  Echo showed LVEF 60 to 65%, grade 1 diastolic dysfunction.   The patient was last seen 02/2023 and was stable from a cardiac perspective.   Today, the patient is overall doing well. She denies chest pain, SOB, lower leg edema, lightheadedness, dizziness, palpitations or heart racing. She does water aerobics a couple times a week and plans to have a personal trainer next year. She does have some chest tightness from asthma. She sees pulmonology next week. She has a lot of sinus issues. She has OSA and uses CPAP every night.   Studies Reviewed EKG Interpretation Date/Time:  Wednesday July 21 2024 10:45:57  EST Ventricular Rate:  58 PR Interval:  130 QRS Duration:  84 QT Interval:  444 QTC Calculation: 435 R Axis:   9  Text Interpretation: Sinus bradycardia with sinus arrhythmia When compared with ECG of 17-Feb-2023 15:04, Vent. rate has decreased BY  45 BPM Confirmed by Franchester, Brady Plant ([id number]) on 07/21/2024 10:59:35 AM    Cardiac cath 01/2023 1.  Normal coronary arteries. 2.  Left ventricular angiography was not performed.  EF was normal by echo. 3.  Normal left ventricular end-diastolic pressure.   Recommendations: No clear culprit is identified for mildly elevated troponin and chest pain.  Suspect tachycardia mediated event.   Echo 01/2023 1. Left ventricular ejection fraction, by estimation, is 60 to 65%. The  left ventricle has normal function. The left ventricle has no regional  wall motion abnormalities. Left ventricular diastolic parameters are  consistent with Grade I diastolic  dysfunction (impaired relaxation).   2. Right ventricular systolic function is normal. The right ventricular  size is normal. There is normal pulmonary artery systolic pressure.   3. The mitral valve is normal in structure. No evidence of mitral valve  regurgitation. No evidence of mitral stenosis.   4. The aortic valve is tricuspid. Aortic valve regurgitation is not  visualized. No aortic stenosis is present.   5. The inferior vena cava is normal in size with greater than 50%  respiratory variability, suggesting right atrial pressure of 3 mmHg.      Physical Exam VS:  BP 126/64 (BP Location: Left Arm, Patient Position: Sitting,  Cuff Size: Normal)   Pulse (!) 58   Ht 6' (1.829 m)   Wt 205 lb 9.6 oz (93.3 kg)   SpO2 98%   BMI 27.88 kg/m        Wt Readings from Last 3 Encounters:  07/21/24 205 lb 9.6 oz (93.3 kg)  04/22/24 201 lb 9.6 oz (91.4 kg)  04/08/24 205 lb 12.8 oz (93.4 kg)    GEN: Well nourished, well developed in no acute distress NECK: No JVD; No carotid bruits CARDIAC: RRR, no  murmurs, rubs, gallops RESPIRATORY:  Clear to auscultation without rales, wheezing or rhonchi  ABDOMEN: Soft, non-tender, non-distended EXTREMITIES:  No edema; No deformity   ASSESSMENT AND PLAN  H/o NSETMI Patient was hospitalized in 2024 for NSTEMI. Cardiac catheterization showed normal coronaries. Echo showed normal LVEF, G1DD, normal RVSF. She denies shortness of breath. She has occasional chest tightness that is from asthma, and sees pulmonology next week. Continue ASA, Lipitor and Zetia .   HTN BP is normal. Bystolic  5mg  on her med list, but patient says she is not taking it. Upon chart review, it was started by PCP 01/2024 for BP. We will take this off since BP is normal and  she has been prescribed propranolol  to take as needed for palpitations. She is no longer on antihypertensives.  HLD LDL 67. Continue Lipitor 40mg  daily and Zetia  10mg  daily.   CKD stage 3 Labs stable. She follows with nephrology.   Sinus tachycardia Patient reports remote history of sinus tachycardia and has propranolol  10mg  to take as needed.   OSA She uses CPAP nightly.        Dispo: Follow-up in 1 year  Signed, Isaiahs Chancy VEAR Fishman, [name]

## 2024-07-27 ENCOUNTER — Encounter: Payer: Self-pay | Admitting: Internal Medicine

## 2024-07-27 ENCOUNTER — Telehealth: Payer: Self-pay | Admitting: Internal Medicine

## 2024-07-27 ENCOUNTER — Ambulatory Visit: Admitting: Internal Medicine

## 2024-07-27 VITALS — BP 118/80 | HR 64 | Temp 97.8°F | Ht 72.0 in | Wt 204.4 lb

## 2024-07-27 DIAGNOSIS — J4520 Mild intermittent asthma, uncomplicated: Secondary | ICD-10-CM

## 2024-07-27 DIAGNOSIS — G4733 Obstructive sleep apnea (adult) (pediatric): Secondary | ICD-10-CM

## 2024-07-27 DIAGNOSIS — J4521 Mild intermittent asthma with (acute) exacerbation: Secondary | ICD-10-CM

## 2024-07-27 MED ORDER — AZITHROMYCIN 250 MG PO TABS: ORAL_TABLET | ORAL | 0 refills | Status: DC

## 2024-07-27 MED ORDER — PREDNISONE 20 MG PO TABS: 20.0000 mg | ORAL_TABLET | Freq: Every day | ORAL | 1 refills | Status: DC

## 2024-07-27 MED ORDER — ALBUTEROL SULFATE HFA 108 (90 BASE) MCG/ACT IN AERS: 1.0000 | INHALATION_SPRAY | Freq: Four times a day (QID) | RESPIRATORY_TRACT | 10 refills | Status: AC | PRN

## 2024-07-27 NOTE — Patient Instructions (Signed)
 Excellent Job A+ GOLD STAR!!  Continue CPAP as prescribed  Patient Instructions Continue to use CPAP every night, minimum of 4-6 hours a night.  Change equipment every 30 days or as directed by DME.  Wash your tubing with warm soap and water daily, hang to dry. Wash humidifier portion weekly. Use bottled, distilled water and change daily   Be aware of reduced alertness and do not drive or operate heavy machinery if experiencing this or drowsiness.  Exercise encouraged, as tolerated. Encouraged proper weight management.  Important to get eight or more hours of sleep  Limiting the use of the computer and television before bedtime.  Decrease naps during the day, so night time sleep will become enhanced.  Limit caffeine, and sleep deprivation.    Avoid Allergens and Irritants Avoid secondhand smoke Avoid SICK contacts Recommend  Masking  when appropriate Recommend Keep up-to-date with vaccinations  Continue inhalers as prescribed

## 2024-07-27 NOTE — Progress Notes (Signed)
 **CT chest 05/01/2019>> bilateral scattered infiltrates, predominantly upper lobe. **CPAP download 07/22/2018-08/20/2018>> usage greater than 4 hours 26/30 days.  Average usage on days used is 7 hours 9 minutes, pressure range 5-15.  Median pressure 8, 95th percentile pressure 11, maximum pressure 13.  Leaks are within normal limits.  Residual AHI is 4.4 overall this shows good compliance with CPAP with excellent control of obstructive sleep apnea. **HST 06/03/2018>> mild sleep apnea with AHI of 10. **chest x-ray7/6/18>>  hyperinflation consistent with emphysema. **CBC 02/21/17>> eosinophils equals 300. CPAP download August 2023 100% for days 90% for greater than 4 hours AHI reduced to 1.2  Chief complaint Follow-up assessment for reactive airways disease Follow-up assessment for OSA Follow-up sarcoid Follow-up assessment for abnormal CT chest  HPI Biopsy-proven sarcoid in the year 2000 Abnormal CT chest since 2020 Most recent CAT scan August 2023 shows similar findings over the last several years Bilateral interstitial lung disease nodular opacities with cavitary lesions with mediastinal adenopathy with calcifications No significant changes over the last several years Sarcoid stable  Previous history of COVID-19 infection Symptoms of chronic bronchitis Patient had been on Trelegy on and off  Regarding OSA Patient uses and benefits from therapy Using CPAP nightly and with naps Pressure setting is comfortable and is sleeping well. Auto CPAP 4-16 AHI reduced to 1.7 Excellent compliance report   Pets: Dog Occupation: Retired airline pilot Exposures: No known exposures.  No mold, hot tub, Jacuzzi.  No feather pillows or comforters Smoking history: Never smoker Travel history: From [place] .  New to [place]  in 2005 Relevant family history: Brother had lung cancer.  He was a smoker.      HRCT Chest 10/03/2021>> Ordered at [facility] by Pulmonology Specialist FINDINGS:   AIRWAYS, LUNGS, PLEURA: Clear central tracheobronchial tree.  No pleural effusion. Multifocal scattered peribronchial nodular consolidation bilaterally. Left apical alveolar airspace consolidation with architectural distortion. Minimal peripheral predominant reticulation and traction bronchiectasis in middle lobe, lingula and bilateral lower lobes.Mild air trapping at the lung bases. No honeycombing   MEDIASTINUM: Normal heart size.  No pericardial effusion.  Mildly dilated 3.2 cm main pulmonary artery. Numerous enlarged and calcified mediastinal and bilateral hilar lymph nodes.   IMAGED ABDOMEN: Partially imaged lobulated multicystic kidneys containing nonobstructing nephrolithiasis. Sequela of Roux-en-Y gastric bypass. Colonic diverticulosis.      CT chest August 2023 similar findings from February 2023  CXR 09/10/2021 Bilateral ill-defined nodular densities predominantly in the right upper lobe, likely chronic inflammatory process. No new consolidation. There is no pleural effusion pneumothorax. The cardiac silhouette is within normal limits. No acute osseous pathology. Degenerative changes of the spine.        Latest Ref Rng & Units 02/26/2024   12:19 PM 05/26/2023   10:36 AM 01/18/2023    5:31 AM  CBC  WBC 3.8 - 10.8 Thousand/uL 5.7  5.6  3.9   Hemoglobin 11.7 - 15.5 g/dL 86.5  87.5  88.3   Hematocrit 35.0 - 45.0 % 42.7  38.9  35.4   Platelets 140 - 400 Thousand/uL 229  216  185        Latest Ref Rng & Units 02/26/2024   12:19 PM 01/18/2023    5:31 AM 01/17/2023   11:32 AM  BMP  Glucose 65 - 99 mg/dL 96  90  831   BUN 7 - 25 mg/dL 24  17  21    Creatinine 0.60 - 1.00 mg/dL 8.30  8.56  8.35   BUN/Creat Ratio 6 -  22 (calc) 14     Sodium 135 - 146 mmol/L 145  138  138   Potassium 3.5 - 5.3 mmol/L 5.1  4.4  4.3   Chloride 98 - 110 mmol/L 107  106  103   CO2 20 - 32 mmol/L 28  25  21    Calcium  8.6 - 10.4 mg/dL 89.9  9.1  9.2     BNP No results found for: BNP  ProBNP No  results found for: PROBNP  PFT    Component Value Date/Time   FEV1PRE 3.54 04/07/2020 1148   FEV1POST 3.55 04/07/2020 1148   FVCPRE 4.71 04/07/2020 1148   FVCPOST 4.61 04/07/2020 1148   TLC 7.48 04/07/2020 1148   DLCOUNC 20.30 04/07/2020 1148   PREFEV1FVCRT 75 04/07/2020 1148   PSTFEV1FVCRT 77 04/07/2020 1148    No results found.        Assessment/Plan 73 year old pleasant African-American female with a diagnosis of pulmonary sarcoid back in 2000 biopsy-proven with a history and diagnosis of obstructive sleep apnea with previous history of COVID-19 infections with a history of intermittent reactive airways disease and asthma   Assessment of OSA Previous AHI 10 Continue CPAP as prescribed  Excellent compliance report Reviewed compliance report in detail with patient Patient definitely benefits the use of CPAP therapy as prescribed Using CPAP nightly and with naps Pressure setting is comfortable and is sleeping well. CPAP prescription 4-16 AHI reduced to 1.7  No evidence of acute heart failure at this time No respiratory distress No fevers, chills, nausea, vomiting, diarrhea No evidence hemoptysis  Patient Instructions Continue to use CPAP every night, minimum of 4-6 hours a night.  Change equipment every 30 days or as directed by DME.  Wash your tubing with warm soap and water daily, hang to dry. Wash humidifier portion weekly. Use bottled, distilled water and change daily    Sarcoidosis Pulmonary sarcoid Stable at this time no exacerbations No indication for CT scans at this time No indication for medication at this time Continue inhalers as prescribed Mild persistent asthma without complication   Reactive airways disease and asthma At this time patient has no symptoms Infrequent use of albuterol  Continue Trelegy as needed Rinse mouth after use    Follow-up with cardiology as scheduled  Follow-up eye examination annually  Follow-up nephrology as  scheduled   MEDICATION ADJUSTMENTS/LABS AND TESTS ORDERED: Continue CPAP as prescribed Continue inhalers as needed Can use albuterol  inhaler prior to exertion Follow-up cardiology as scheduled Follow-up eye examination  Follow-up kidney doctor as scheduled Backup antibiotics and prednisone  for her trip to [place]  CURRENT MEDICATIONS REVIEWED AT LENGTH WITH PATIENT TODAY   Patient  satisfied with Plan of action and management. All questions answered   Follow up 1 year   I spent a total of 45 minutes dedicated to the care of this patient on the date of this encounter to include pre-visit review of records, face-to-face time with the patient discussing conditions above, post visit ordering of testing, clinical documentation with the electronic health record, making appropriate referrals as documented, and communicating necessary information to the patient's healthcare team.    The Patient requires high complexity decision making for assessment and support, frequent evaluation and titration of therapies, application of advanced monitoring technologies and extensive interpretation of multiple databases.  Patient satisfied with Plan of action and management. All questions answered    Nickolas Alm Cellar, M.D.  [facility] Pulmonary & Critical Care Medicine  Medical Director [facility] [place]

## 2024-07-27 NOTE — Telephone Encounter (Unsigned)
 Copied from CRM [id number]. Topic: Clinical - Medication Refill >> Jul 27, 2024  4:13 PM Leila C wrote: Most Recent Pulmonary Care Visit:  Provider: Dr. Nickolas Cellar Department: [facility] Pulmonary Care Date: 07/27/24 Medication(s):  fluticasone  (FLONASE ) 50 MCG/ACT nasal spray    Has the patient contacted their pharmacy? No. Patient saw Dr. Cellar today and forgot to ask for a refill. (Agent: If no, request that the patient contact the pharmacy for the refill. If patient does not wish to contact the pharmacy document the reason why and proceed with request.) (Agent: If yes, when and what did the pharmacy advise?)  This is the patient's preferred pharmacy:  [id number] [address] [facility] - [address] [place] [address] Phone: [phone] Fax: [phone]   Is this the correct pharmacy for this prescription? No If no, delete pharmacy and type the correct one.   Has the prescription been filled recently? No  Is the patient out of the medication? No. Will be out of medication soon, patient is leaving out of the country next Tuesday.   Has the patient been seen for an appointment in the last year OR does the patient have an upcoming appointment? Yes  Can we respond through MyChart? Yes  Agent: Please be advised that Rx refills may take up to 3 business days. We ask that you follow-up with your pharmacy.

## 2024-07-28 DIAGNOSIS — F411 Generalized anxiety disorder: Secondary | ICD-10-CM | POA: Diagnosis not present

## 2024-07-28 MED ORDER — FLUTICASONE PROPIONATE 50 MCG/ACT NA SUSP: 2.0000 | Freq: Every day | NASAL | 3 refills | Status: AC

## 2024-08-21 ENCOUNTER — Other Ambulatory Visit: Payer: Self-pay | Admitting: Family Medicine

## 2024-08-21 DIAGNOSIS — I10 Essential (primary) hypertension: Secondary | ICD-10-CM

## 2024-08-23 NOTE — Telephone Encounter (Signed)
 D/C 07/21/24. Requested Prescriptions  Refused Prescriptions Disp Refills   nebivolol  (BYSTOLIC ) 5 MG tablet [Pharmacy Med Name: NEBIVOLOL  5 MG TABLET] 90 tablet 1    Sig: TAKE 1 TABLET (5 MG TOTAL) BY MOUTH DAILY.     Cardiovascular: Beta Blockers 3 Failed - 08/23/2024  3:07 PM      Failed - Cr in normal range and within 360 days    Creat  Date Value Ref Range Status  02/26/2024 1.69 (H) 0.60 - 1.00 mg/dL Final         Failed - AST in normal range and within 360 days    AST  Date Value Ref Range Status  02/26/2024 39 (H) 10 - 35 U/L Final   SGOT(AST)  Date Value Ref Range Status  04/15/2014 24 15 - 37 Unit/L Final         Passed - ALT in normal range and within 360 days    ALT  Date Value Ref Range Status  02/26/2024 27 6 - 29 U/L Final   SGPT (ALT)  Date Value Ref Range Status  04/15/2014 12 (L) U/L Final    Comment:    14-63 NOTE: New Reference Range 03/08/14          Passed - Last BP in normal range    BP Readings from Last 1 Encounters:  07/27/24 118/80         Passed - Last Heart Rate in normal range    Pulse Readings from Last 1 Encounters:  07/27/24 64         Passed - Valid encounter within last 6 months    Recent Outpatient Visits           4 months ago Right lower quadrant abdominal pain   [facility] Glenard Mire, MD   5 months ago Atherosclerosis of aorta   [facility] Glenard Mire, [place]   7 months ago Primary hypertension   [facility] Glenard Mire, [place]   10 months ago Primary hypertension   [facility] Gareth Mliss [place], [place]       Future Appointments             In 1 week Sowles, Krichna, MD [facility] [place], [place]

## 2024-08-30 ENCOUNTER — Ambulatory Visit: Admitting: Family Medicine

## 2024-08-30 ENCOUNTER — Encounter: Payer: Self-pay | Admitting: Family Medicine

## 2024-08-30 VITALS — BP 124/78 | HR 60 | Resp 16 | Ht 72.0 in | Wt 207.7 lb

## 2024-08-30 DIAGNOSIS — N2581 Secondary hyperparathyroidism of renal origin: Secondary | ICD-10-CM | POA: Diagnosis not present

## 2024-08-30 DIAGNOSIS — D860 Sarcoidosis of lung: Secondary | ICD-10-CM | POA: Diagnosis not present

## 2024-08-30 DIAGNOSIS — N1832 Chronic kidney disease, stage 3b: Secondary | ICD-10-CM | POA: Diagnosis not present

## 2024-08-30 DIAGNOSIS — J8489 Other specified interstitial pulmonary diseases: Secondary | ICD-10-CM | POA: Diagnosis not present

## 2024-08-30 DIAGNOSIS — G4733 Obstructive sleep apnea (adult) (pediatric): Secondary | ICD-10-CM

## 2024-08-30 DIAGNOSIS — D573 Sickle-cell trait: Secondary | ICD-10-CM

## 2024-08-30 DIAGNOSIS — I10 Essential (primary) hypertension: Secondary | ICD-10-CM | POA: Diagnosis not present

## 2024-08-30 DIAGNOSIS — J4489 Other specified chronic obstructive pulmonary disease: Secondary | ICD-10-CM

## 2024-08-30 DIAGNOSIS — R918 Other nonspecific abnormal finding of lung field: Secondary | ICD-10-CM | POA: Diagnosis not present

## 2024-08-30 DIAGNOSIS — I700 Atherosclerosis of aorta: Secondary | ICD-10-CM | POA: Diagnosis not present

## 2024-08-30 DIAGNOSIS — I728 Aneurysm of other specified arteries: Secondary | ICD-10-CM | POA: Diagnosis not present

## 2024-08-30 DIAGNOSIS — D718 Other functional disorders of polymorphonuclear neutrophils: Secondary | ICD-10-CM

## 2024-08-30 MED ORDER — EZETIMIBE 10 MG PO TABS: 10.0000 mg | ORAL_TABLET | Freq: Every day | ORAL | 1 refills | Status: AC

## 2024-08-30 MED ORDER — NEBIVOLOL HCL 5 MG PO TABS: 5.0000 mg | ORAL_TABLET | Freq: Every day | ORAL | 1 refills | Status: AC

## 2024-08-30 MED ORDER — ATORVASTATIN CALCIUM 40 MG PO TABS: 40.0000 mg | ORAL_TABLET | Freq: Every day | ORAL | 1 refills | Status: AC

## 2024-08-30 NOTE — Progress Notes (Signed)
 Name: Melissa [name]   MRN: [id number]    DOB: [birth year]   Date:08/30/2024       Progress Note  Subjective  Chief Complaint  Chief Complaint  Patient presents with   Medical Management of Chronic Issues   Discussed the use of AI scribe software for clinical note transcription with the patient, who gave verbal consent to proceed.  History of Present Illness Melissa [name] is a 74 year old female who presents for a regular follow-up visit.  She experiences intermittent right lower quadrant pain associated with physical activity, such as gym workouts. The pain is described as coming and going and is sometimes accompanied by constipation. Previous CT scan and colonoscopy revealed no significant findings. She has a history of bariatric surgery and C-section. The pain is tolerable and managed as it occurs. She is also up to date with colonoscopy.   She has interstitial lung disease due to  sarcoidosis of lungs and also asthma with chronic bronchitis. She is under the care of Dr. Isaiah - pulmonologist . Previously on Trelegy, she now uses albuterol  as needed. No current breathing issues and has not used her inhaler recently. She was provided with a Z-pack and amoxicillin  for her trip to [place] but did not need to use them.  She has a history of supraventricular tachycardia and takes inderal  TID prn only . Currently, she takes Bystolic  daily for blood pressure management and heart rate control.   She has chronic kidney disease stage 3, with a GFR of 32 noted in July. She sees a kidney specialist every six months and is due for a follow-up. She maintains hydration and monitors her kidney function regularly.  She uses a CPAP machine for obstructive sleep apnea and took it on her recent trip to [place].  She experiences occasional hip pain, which she attributes to prolonged sitting during travel. She is currently taking gabapentin , which was increased but caused her to experience incorrect word  usage. She has since reverted to her previous dose, which she finds more manageable.  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   Localized swelling of right lower leg 09/06/2019   OSA on CPAP 09/06/2019   Secondary hyperparathyroidism of renal origin 07/21/2019   Anemia in chronic kidney disease 06/14/2019   Benign hypertensive kidney disease with chronic kidney disease 06/14/2019   Sickle cell trait 06/14/2019   Stage 3b chronic kidney disease (HCC) 06/14/2019   Chronic bronchitis (HCC) 05/19/2019   Chronic venous insufficiency 04/15/2019   Lymphedema 04/15/2019   Family history of colon cancer    Trigeminal neuralgia 09/11/2018   Low back pain without sciatica 02/21/2017   Renal cyst 10/18/2016   Atherosclerosis of aorta 10/16/2016   History of colonic polyps 09/25/2016   Dyslipidemia 12/08/2015   Gastroesophageal reflux disease without esophagitis 08/11/2015   Primary osteoarthritis of knee 06/13/2015   Hip bursitis 06/13/2015   RLS (restless legs syndrome) 06/13/2015   Depression,  major, in remission 06/13/2015   B12 deficiency 06/13/2015   History of Roux-en-Y gastric bypass 06/13/2015   History of iron deficiency anemia 06/13/2015   Vitamin D  deficiency 06/13/2015   Hypertension, benign 06/13/2015   Perennial allergic rhinitis with seasonal variation 06/13/2015   Primary osteoarthritis of both knees 06/13/2015    Cervical radiculitis 01/04/2014   Cervical spinal stenosis 01/04/2014   Cervical osteoarthritis 01/04/2014    Past Surgical History:  Procedure Laterality Date   BARIATRIC SURGERY     CESAREAN SECTION     3 or more   COLONOSCOPY WITH PROPOFOL  N/A 03/15/2019   Procedure: COLONOSCOPY WITH PROPOFOL ;  Surgeon: Unk Corinn Skiff, MD;  Location: [facility] ENDOSCOPY;  Service: Gastroenterology;  Laterality: N/A;   COLONOSCOPY WITH PROPOFOL  N/A 03/15/2022   Procedure: COLONOSCOPY WITH PROPOFOL ;  Surgeon: Unk Corinn Skiff, MD;  Location: [facility] ENDOSCOPY;  Service: Gastroenterology;  Laterality: N/A;   GASTRIC BYPASS     KNEE ARTHROPLASTY Right 10/18/2020   Procedure: COMPUTER ASSISTED TOTAL KNEE ARTHROPLASTY;  Surgeon: Mardee Lynwood SQUIBB, MD;  Location: [facility] ORS;  Service: Orthopedics;  Laterality: Right;   LEFT HEART CATH AND CORONARY ANGIOGRAPHY N/A 01/20/2023   Procedure: LEFT HEART CATH AND CORONARY ANGIOGRAPHY;  Surgeon: Darron Deatrice LABOR, MD;  Location: [facility] INVASIVE CV LAB;  Service: Cardiovascular;  Laterality: N/A;   SHOULDER ARTHROSCOPY Right    TUBAL LIGATION      Family History  Problem Relation Age of Onset   Hypercholesterolemia Mother    Heart disease Mother    Hypertension Mother    Alcohol abuse Father    Lung cancer Brother    Alcohol abuse Brother    Diabetes Mellitus II Sister    Hypertension Maternal Grandmother    Colon cancer Sister    Breast cancer Neg Hx    Prostate cancer Neg Hx    Bladder Cancer Neg Hx    Kidney cancer Neg Hx     Social History   Tobacco Use   Smoking status: Never   Smokeless tobacco: Never   Tobacco comments:    Second hand smoke exposure  Substance Use Topics   Alcohol use: Not Currently    Alcohol/week: 0.0 standard drinks of alcohol    Current Medications[1]  Allergies[2]  I personally reviewed active problem list, medication list, allergies, family history with the patient/caregiver today.   ROS  Ten systems reviewed and is  negative except as mentioned in HPI    Objective Physical Exam  CONSTITUTIONAL: Patient appears well-developed and well-nourished. No distress. HEENT: Head atraumatic, normocephalic, neck supple. CARDIOVASCULAR: Normal rate, regular rhythm and normal heart sounds. No murmur heard. No BLE edema. PULMONARY: Effort normal and breath sounds normal. No respiratory distress. ABDOMINAL: There is no tenderness or distention. MUSCULOSKELETAL: Normal gait. Without gross motor or sensory deficit. PSYCHIATRIC: Patient has a normal mood and affect. Behavior is normal. Judgment and thought content normal.  Vitals:   08/30/24 0941  BP: 124/78  Pulse: 60  Resp: 16  SpO2: 95%  Weight: 207 lb 11.2 oz (94.2 kg)  Height: 6' (1.829 m)    Body mass index is 28.17 kg/m.    PHQ2/9:    08/30/2024    9:38 AM 04/22/2024   10:13 AM 02/26/2024   10:21 AM 01/19/2024    3:13 PM 11/06/2023   10:24 AM  Depression screen PHQ 2/9  Decreased Interest 0 0 0 0 0  Down, Depressed, Hopeless 0 0 0 0 0  PHQ - 2 Score 0  0 0 0 0  Altered sleeping 0 0 0  0  Tired, decreased energy 0 0 0  0  Change in appetite 0 0 0  0  Feeling bad or failure about yourself  0 0 0  0  Trouble concentrating 0 0 0  0  Moving slowly or fidgety/restless 0 0 0  0  Suicidal thoughts 0 0 0  0  PHQ-9 Score 0 0  0   0   Difficult doing work/chores Not difficult at all Not difficult at all Not difficult at all  Not difficult at all     Data saved with a previous flowsheet row definition    phq 9 is negative  Fall Risk:    08/30/2024    9:37 AM 04/22/2024   10:13 AM 02/26/2024   10:15 AM 01/19/2024    3:13 PM 11/06/2023   10:26 AM  Fall Risk   Falls in the past year? 0 0 0 0 0  Number falls in past yr: 0 0 0 0 0  Injury with Fall? 0 0  0  0  0   Risk for fall due to : No Fall Risks No Fall Risks No Fall Risks No Fall Risks No Fall Risks  Follow up Falls evaluation completed Falls evaluation completed Falls evaluation completed Falls  prevention discussed;Education provided;Falls evaluation completed Falls prevention discussed;Falls evaluation completed     Data saved with a previous flowsheet row definition      Assessment & Plan Chronic kidney disease stage 3b with secondary hyperparathyroidism and hypertension Blood pressure controlled with Bystolic . GFR 32 in July. Nephrology follow-up every six months. - Continue Bystolic  for blood pressure management. - Ensure follow-up with nephrologist every six months.  Interstitial lung disease due to sarcoidosis with chronic asthmatic bronchitis Previously on Trelegy, now using albuterol  as needed. No recent respiratory issues. - Use albuterol  as needed for respiratory symptoms. - Use Trelegy as needed if albuterol  is ineffective.  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 No immediate concerns. - Continue monitoring with specialist, Dr. Marea , last scan May 2025  Obstructive sleep apnea on CPAP Managed with CPAP. Plans to purchase travel CPAP for convenience. - Continue CPAP therapy. - Consider purchasing a travel CPAP machine.  Lung nodules Identified on previous CT. Follow-up CT in 18 months. - Schedule follow-up CT chest in January 2027.  Supraventricular tachycardia and angina, on beta blocker Managed with Bystolic . Previously used propranolol  as needed. - Continue Bystolic  for heart rate and blood pressure management. - Use propranolol  as needed for SVT.        [1]  Current Outpatient Medications:    acetaminophen  (TYLENOL ) 650 MG CR tablet, Take 1,300 mg by mouth every 8 (eight) hours as needed for pain., Disp: , Rfl:    albuterol  (VENTOLIN  HFA) 108 (90 Base) MCG/ACT inhaler, Inhale 1-2 puffs into the lungs every 6 (six) hours as needed for wheezing or shortness of breath (or cough)., Disp: 18 g, Rfl: 10   amoxicillin  (AMOXIL ) 500 MG capsule, Take 1,000 mg by mouth 2 (two) times daily., Disp: , Rfl:    Ascorbic Acid   (VITAMIN C ) 1000 MG tablet, Take 1,000 mg by mouth daily., Disp: , Rfl:    aspirin  EC 81 MG tablet, Take 1 tablet by mouth daily., Disp: , Rfl:    atorvastatin  (LIPITOR) 40 MG tablet, Take 1 tablet (40 mg total) by mouth daily., Disp: 90 tablet, Rfl: 1   azithromycin  (ZITHROMAX  Z-PAK) 250  MG tablet, Take 2 tablets on Day 1 and then 1 tablet daily till gone., Disp: 6 each, Rfl: 0   calcium  carbonate (TUMS - DOSED IN MG ELEMENTAL CALCIUM ) 500 MG chewable tablet, Chew 500-1,000 mg by mouth daily as needed for indigestion or heartburn., Disp: , Rfl:    Cholecalciferol (D 1000) 25 MCG (1000 UT) capsule, Take 1,000 Units by mouth daily., Disp: , Rfl:    empagliflozin (JARDIANCE) 10 MG TABS tablet, Take 10 mg by mouth daily., Disp: , Rfl:    ezetimibe  (ZETIA ) 10 MG tablet, Take 1 tablet (10 mg total) by mouth daily., Disp: 90 tablet, Rfl: 1   fluticasone  (FLONASE ) 50 MCG/ACT nasal spray, Place 2 sprays into both nostrils daily. SPRAY 2 SPRAYS INTO EACH NOSTRIL EVERY DAY, Disp: 48 mL, Rfl: 3   gabapentin  (NEURONTIN ) 100 MG capsule, Take 100-200 mg by mouth 2 (two) times daily. One in am, one in pm and  and three at night, Disp: , Rfl:    levocetirizine (XYZAL  ALLERGY 24HR) 5 MG tablet, Take 1 tablet (5 mg total) by mouth every evening., Disp: 30 tablet, Rfl: 6   magnesium  oxide (MAG-OX) 400 MG tablet, Take 400 mg by mouth daily., Disp: , Rfl:    Multiple Vitamins-Minerals (CENTRUM SILVER ) tablet, Take 1 tablet by mouth daily., Disp: 30 tablet, Rfl: 0   predniSONE  (DELTASONE ) 20 MG tablet, Take 1 tablet (20 mg total) by mouth daily with breakfast. 10 days, Disp: 10 tablet, Rfl: 1   Probiotic Product (FORTIFY PROBIOTIC WOMENS EX ST PO), Take by mouth daily., Disp: , Rfl:    propranolol  (INDERAL ) 10 MG tablet, Take 1 tablet (10 mg total) by mouth 3 (three) times daily as needed. For sustained fast heart rate, Disp: 60 tablet, Rfl: 3   SHINGRIX  injection, Inject 0.5 mLs into the muscle once., Disp: , Rfl:     TRELEGY ELLIPTA  200-62.5-25 MCG/ACT AEPB, INHALE ONE ACTUATION INTO THE LUNGS DAILY., Disp: 60 each, Rfl: 3   zinc  gluconate 50 MG tablet, Take 50 mg by mouth daily., Disp: , Rfl:  [2]  Allergies Allergen Reactions   Shellfish Allergy     Edema

## 2024-09-01 ENCOUNTER — Telehealth: Payer: Self-pay

## 2024-09-01 NOTE — Telephone Encounter (Signed)
 Copied from CRM #[id number]. Topic: Clinical - Order For Equipment >> Sep 01, 2024  8:21 AM Melissa [name] wrote: Reason for CRM: Patient is requesting [name] copy of her CPAP prescription sent to [facility]. Requesting [name] call back once sent - patient has already paid for the CPAP machine from the website.    Fax # [phone].

## 2024-09-02 NOTE — Telephone Encounter (Signed)
 I have faxed the Cpap order to Fax # [phone]

## 2024-09-02 NOTE — Telephone Encounter (Signed)
 I could never get the fax to go through so I emailed the order to [facility]

## 2024-09-02 NOTE — Telephone Encounter (Signed)
 Spoke with pt and she is aware of CPAP order being emailed to [facility]. NFN.

## 2024-11-11 ENCOUNTER — Ambulatory Visit

## 2024-12-17 ENCOUNTER — Encounter (INDEPENDENT_AMBULATORY_CARE_PROVIDER_SITE_OTHER)

## 2024-12-17 ENCOUNTER — Ambulatory Visit (INDEPENDENT_AMBULATORY_CARE_PROVIDER_SITE_OTHER): Admitting: Vascular Surgery

## 2025-02-28 ENCOUNTER — Ambulatory Visit: Admitting: Family Medicine
